# Patient Record
Sex: Male | Born: 1955
Health system: Southern US, Community
[De-identification: ages and names within clinical notes are randomized; demographics above are authoritative.]

## PROBLEM LIST (undated history)

## (undated) DIAGNOSIS — N400 Enlarged prostate without lower urinary tract symptoms: Secondary | ICD-10-CM

## (undated) DIAGNOSIS — E669 Obesity, unspecified: Secondary | ICD-10-CM

## (undated) DIAGNOSIS — J302 Other seasonal allergic rhinitis: Secondary | ICD-10-CM

## (undated) DIAGNOSIS — G4730 Sleep apnea, unspecified: Secondary | ICD-10-CM

## (undated) DIAGNOSIS — F419 Anxiety disorder, unspecified: Secondary | ICD-10-CM

## (undated) DIAGNOSIS — F1910 Other psychoactive substance abuse, uncomplicated: Secondary | ICD-10-CM

## (undated) DIAGNOSIS — J189 Pneumonia, unspecified organism: Secondary | ICD-10-CM

## (undated) DIAGNOSIS — I10 Essential (primary) hypertension: Secondary | ICD-10-CM

## (undated) DIAGNOSIS — E538 Deficiency of other specified B group vitamins: Secondary | ICD-10-CM

## (undated) DIAGNOSIS — M1990 Unspecified osteoarthritis, unspecified site: Secondary | ICD-10-CM

## (undated) DIAGNOSIS — G250 Essential tremor: Secondary | ICD-10-CM

## (undated) DIAGNOSIS — J449 Chronic obstructive pulmonary disease, unspecified: Secondary | ICD-10-CM

## (undated) DIAGNOSIS — R7401 Elevation of levels of liver transaminase levels: Secondary | ICD-10-CM

## (undated) DIAGNOSIS — M419 Scoliosis, unspecified: Secondary | ICD-10-CM

## (undated) DIAGNOSIS — E66811 Obesity, class 1: Secondary | ICD-10-CM

## (undated) DIAGNOSIS — I454 Nonspecific intraventricular block: Secondary | ICD-10-CM

## (undated) DIAGNOSIS — I712 Thoracic aortic aneurysm, without rupture, unspecified: Secondary | ICD-10-CM

## (undated) DIAGNOSIS — Z8659 Personal history of other mental and behavioral disorders: Secondary | ICD-10-CM

## (undated) DIAGNOSIS — N189 Chronic kidney disease, unspecified: Secondary | ICD-10-CM

## (undated) DIAGNOSIS — K5790 Diverticulosis of intestine, part unspecified, without perforation or abscess without bleeding: Secondary | ICD-10-CM

## (undated) DIAGNOSIS — R972 Elevated prostate specific antigen [PSA]: Secondary | ICD-10-CM

## (undated) DIAGNOSIS — D649 Anemia, unspecified: Secondary | ICD-10-CM

## (undated) DIAGNOSIS — J45909 Unspecified asthma, uncomplicated: Secondary | ICD-10-CM

## (undated) DIAGNOSIS — M810 Age-related osteoporosis without current pathological fracture: Secondary | ICD-10-CM

## (undated) DIAGNOSIS — L309 Dermatitis, unspecified: Secondary | ICD-10-CM

## (undated) DIAGNOSIS — F1010 Alcohol abuse, uncomplicated: Secondary | ICD-10-CM

## (undated) DIAGNOSIS — K219 Gastro-esophageal reflux disease without esophagitis: Secondary | ICD-10-CM

## (undated) DIAGNOSIS — I509 Heart failure, unspecified: Secondary | ICD-10-CM

## (undated) DIAGNOSIS — R740 Nonspecific elevation of levels of transaminase and lactic acid dehydrogenase [LDH]: Secondary | ICD-10-CM

## (undated) DIAGNOSIS — J4520 Mild intermittent asthma, uncomplicated: Secondary | ICD-10-CM

## (undated) DIAGNOSIS — I701 Atherosclerosis of renal artery: Secondary | ICD-10-CM

## (undated) HISTORY — DX: Alcohol abuse, uncomplicated: F10.10

## (undated) HISTORY — DX: Unspecified osteoarthritis, unspecified site: M19.90

## (undated) HISTORY — DX: Gastro-esophageal reflux disease without esophagitis: K21.9

## (undated) HISTORY — DX: Obesity, unspecified: E66.9

## (undated) HISTORY — DX: Scoliosis, unspecified: M41.9

## (undated) HISTORY — DX: Unspecified asthma, uncomplicated: J45.909

## (undated) HISTORY — DX: Nonspecific intraventricular block: I45.4

## (undated) HISTORY — DX: Other seasonal allergic rhinitis: J30.2

## (undated) HISTORY — DX: Personal history of other mental and behavioral disorders: Z86.59

## (undated) HISTORY — DX: Essential tremor: G25.0

## (undated) HISTORY — DX: Elevated prostate specific antigen (PSA): R97.20

## (undated) HISTORY — DX: Atherosclerosis of renal artery: I70.1

## (undated) HISTORY — DX: Elevation of levels of liver transaminase levels: R74.01

## (undated) HISTORY — PX: VASECTOMY: SHX75

## (undated) HISTORY — PX: HERNIA REPAIR: SHX51

## (undated) HISTORY — DX: Essential (primary) hypertension: I10

## (undated) HISTORY — DX: Dermatitis, unspecified: L30.9

## (undated) HISTORY — DX: Age-related osteoporosis without current pathological fracture: M81.0

## (undated) HISTORY — DX: Diverticulosis of intestine, part unspecified, without perforation or abscess without bleeding: K57.90

## (undated) HISTORY — DX: Other psychoactive substance abuse, uncomplicated: F19.10

## (undated) HISTORY — DX: Mild intermittent asthma, uncomplicated: J45.20

## (undated) HISTORY — PX: INGUINAL HERNIA REPAIR: SUR1180

## (undated) HISTORY — DX: Benign prostatic hyperplasia without lower urinary tract symptoms: N40.0

## (undated) HISTORY — DX: Obesity, class 1: E66.811

## (undated) HISTORY — DX: Nonspecific elevation of levels of transaminase and lactic acid dehydrogenase (ldh): R74.0

---

## 1997-06-08 ENCOUNTER — Emergency Department (HOSPITAL_COMMUNITY): Admission: EM | Admit: 1997-06-08 | Discharge: 1997-06-08 | Payer: Self-pay | Admitting: Emergency Medicine

## 2003-04-15 ENCOUNTER — Ambulatory Visit (HOSPITAL_COMMUNITY): Admission: RE | Admit: 2003-04-15 | Discharge: 2003-04-15 | Payer: Self-pay | Admitting: Gastroenterology

## 2005-10-10 ENCOUNTER — Encounter: Admission: RE | Admit: 2005-10-10 | Discharge: 2005-10-10 | Payer: Self-pay | Admitting: Internal Medicine

## 2006-01-01 HISTORY — PX: ELBOW SURGERY: SHX618

## 2006-08-15 ENCOUNTER — Encounter (INDEPENDENT_AMBULATORY_CARE_PROVIDER_SITE_OTHER): Payer: Self-pay | Admitting: Plastic Surgery

## 2006-08-15 ENCOUNTER — Ambulatory Visit (HOSPITAL_BASED_OUTPATIENT_CLINIC_OR_DEPARTMENT_OTHER): Admission: RE | Admit: 2006-08-15 | Discharge: 2006-08-15 | Payer: Self-pay | Admitting: Plastic Surgery

## 2006-11-22 ENCOUNTER — Ambulatory Visit (HOSPITAL_COMMUNITY): Admission: RE | Admit: 2006-11-22 | Discharge: 2006-11-22 | Payer: Self-pay | Admitting: Neurology

## 2007-08-28 ENCOUNTER — Ambulatory Visit (HOSPITAL_BASED_OUTPATIENT_CLINIC_OR_DEPARTMENT_OTHER): Admission: RE | Admit: 2007-08-28 | Discharge: 2007-08-28 | Payer: Self-pay | Admitting: Orthopaedic Surgery

## 2008-01-01 ENCOUNTER — Ambulatory Visit (HOSPITAL_COMMUNITY): Admission: RE | Admit: 2008-01-01 | Discharge: 2008-01-01 | Payer: Self-pay | Admitting: Internal Medicine

## 2008-11-12 LAB — HM COLONOSCOPY

## 2009-01-01 DIAGNOSIS — I701 Atherosclerosis of renal artery: Secondary | ICD-10-CM

## 2009-01-01 HISTORY — DX: Atherosclerosis of renal artery: I70.1

## 2009-02-01 HISTORY — PX: US ECHOCARDIOGRAPHY: HXRAD669

## 2009-05-01 HISTORY — PX: US RENAL/AORTA: HXRAD530

## 2010-05-16 NOTE — Op Note (Signed)
[name], Louis [name]                  ACCOUNT NO.:  [id number]   MEDICAL RECORD NO.:  [id number]          PATIENT TYPE:  AMB   LOCATION:  DSC                          FACILITY:  [facility]   PHYSICIAN:  Brantley Persons, M.D.DATE OF BIRTH:  [birth year]   DATE OF PROCEDURE:  08/15/2006  DATE OF DISCHARGE:                               OPERATIVE REPORT   PREOP DIAGNOSIS:  Suspicious skin lesion left nasolabial fold.   POSTOP DIAGNOSIS:  Suspicious skin lesion left nasolabial fold.   PROCEDURE:  1. Excisional biopsy 0.6 cm suspicious skin lesion with intraoperative      frozen section diagnosis left nasolabial fold.  2. Complex closure 1.1 cm incision left nasolabial fold incision.   ATTENDING SURGEON:  Dr. Corrie Dandy Contogiannis.   ANESTHESIA:  1% lidocaine with epinephrine.   COMPLICATIONS:  None.   INDICATIONS FOR PROCEDURE:  The patient is a 55 year old Caucasian male  who presents with a suspicious skin lesion in an area adjacent to the  left nasolabial fold.  The patient would like to see with an excisional  biopsy.  We will do so with an intraoperative frozen section diagnosis  to confirm margins as well as a preliminary diagnosis.   PROCEDURE:  The patient was brought to the minor room, placed on the  table in the supine position.  The face was prepped with Betadine and  draped in sterile fashion.  The skin and subcutaneous tissues in the  area of the suspicious skin lesion were injected with 4% lidocaine with  epinephrine.  After adequate hemostasis and anesthesia had taken effect  the procedure was begun.   Using a loupe magnification, the suspicious skin lesion was identified.  The skin lesion was then excised with 1 mm circumferential margins full  thickness through the skin into the subcutaneous tissues.  The specimen  was marked in a 12 o'clock position and passed off the table to undergo  an intraoperative frozen section diagnosis.  Dr. Jimmy Picket, the  pathologist,  reviewed the specimen and felt that all the margins were  clear.  He did not think that a skin cancer was present, however, he was  not sure what type of benign lesion this was.  They will perform  permanent pathologic section evaluation to fully determine the nature of  the skin lesion.  Total diameter of the excised skin lesion was 0.6 cm.   A complex closure of the incision then proceeded.  The deeper  subcutaneous tissues were closed with 4-0 Monocryl suture.  The dermal  layer was also closed with 4-0 Monocryl suture.  The skin was then  closed with a 6-0 Prolene in a running baseball type stitch.  Total  length of incision closure was 1.1 cm.  This skin lesion was adjacent to  the  medial aspect of the left nasal labial fold and closed nicely.  Bacitracin ointment was applied for dressing.  There were no  complications.  The patient tolerated the procedure well.  He was given  proper postoperative wound care instructions and discharged home in  stable condition.  Follow-up appointment  will be tomorrow in the office.           ______________________________  Brantley Persons, M.D.     MC/MEDQ  D:  08/15/2006  T:  08/16/2006  Job:  [id number]   cc:   Brantley Persons, M.D.

## 2010-05-16 NOTE — Op Note (Signed)
Louis [name], Louis [name]                  ACCOUNT NO.:  [id number]   MEDICAL RECORD NO.:  [id number]          PATIENT TYPE:  AMB   LOCATION:  DSC                          FACILITY:  [facility]   PHYSICIAN:  Claude Manges. Whitfield, M.D.DATE OF BIRTH:  August 15, 1955   DATE OF PROCEDURE:  08/28/2007  DATE OF DISCHARGE:                               OPERATIVE REPORT   PREOPERATIVE DIAGNOSIS:  Refractory medial epicondylitis, right elbow.   POSTOPERATIVE DIAGNOSIS:  Refractory medial epicondylitis, right elbow.   PROCEDURE:  Release of common flexor tendon, medial epicondyle, right  elbow with debridement of abnormal tendon.   SURGEON:  Claude Manges. Cleophas Dunker, MD   ANESTHESIA:  General LMA.   COMPLICATIONS:  None.   HISTORY:  A 55 year old gentleman has had recurrent problems with both  medial and lateral epicondylitis of his right elbow.  He has had 2 prior  cortisone injections medially and 1 laterally.  He is not presently  experiencing symptoms laterally, but continues to have pain with grip,  particularly in flexion medially.  He is now to have surgical  decompression.   PROCEDURE:  With the patient comfortable on operating table, he was  placed under general LMA anesthesia.  The right upper extremity was then  placed in an arm tourniquet.  The arm was prepped with DuraPrep from the  wrist to the tourniquet.  Sterile draping was performed with the  extremity still elevated, was Esmarch exsanguinated with a proximal  tourniquet at 250 mmHg.   A slightly curvilinear incision was outlined over the medial epicondyle  and via sharp dissection carried down to subcutaneous tissue, then via  blunt dissection, adipose tissue and superficial fascia was elevated  from the common flexor tendon attachment to the medial epicondyle.  Self-  retaining retractor was inserted.  I could palpate the ulnar nerve, but  it was not visualized.  There were no ulnar nerve symptoms.   I then incised the origin of the  common flexor tendon approximately a  centimeter distal to the end of the medial epicondyle.  The flexor  tendon was then elevated and I could see degenerative tendon that  appeared to be the cause of the problem.  There were also areas of  cortisone particles and these were sharply debrided, so that I did not  see any abnormal tendon.  The exposed bone was debrided, so that I had a  bleeding surface.  The wound was irrigated with saline solution.  I  reattached the tendon anatomically with 2-0 Vicryl.  The wound was again  irrigated.  The subcu was closed with 2-0 and 3-0 Vicryl, and the skin  closed with Steri-Strips over benzoin.  Marcaine without epinephrine was  injected in the wound edges.  Sterile bulky dressing was applied  followed by posterior splint and Ace bandage.  Tourniquet was deflated  with immediate capillary refill to the fingers.   The patient tolerated the procedure without complications.   PLAN:  Office 1 week.  Percocet for pain.      Claude Manges. Cleophas Dunker, M.D.  Electronically Signed  PWW/MEDQ  D:  08/28/2007  T:  08/28/2007  Job:  [id number]

## 2010-05-19 NOTE — Op Note (Signed)
NAME:  Louis [name], Louis [name]                            ACCOUNT NO.:  [id number]   MEDICAL RECORD NO.:  [id number]                   PATIENT TYPE:  AMB   LOCATION:  ENDO                                 FACILITY:  [facility]   PHYSICIAN:  Anselmo Rod, M.D.               DATE OF BIRTH:  [birth year]   DATE OF PROCEDURE:  04/15/2003  DATE OF DISCHARGE:                                 OPERATIVE REPORT   PROCEDURE PERFORMED:  Screening colonoscopy.   ENDOSCOPIST:  Anselmo Rod, M.D.   INSTRUMENT USED:  Olympus video colonoscope.   INDICATION FOR PROCEDURE:  A 55 year old white male with a family history of  colon cancer in a first degree relative (father) undergoing a screening  colonoscopy.  Rule out colonic polyps, masses, etc.   PREPROCEDURE PREPARATION:  Informed consent was procured from the patient.  The patient was fasted for eight hours prior to the procedure and prepped  with a bottle of Miralax the night prior to the procedure.   PREPROCEDURE PHYSICAL:  VITAL SIGNS:  The patient had stable vital signs.  NECK:  Supple.  CHEST:  Clear to auscultation.  S1, S2 regular.  ABDOMEN:  Soft with normal bowel sounds.   DESCRIPTION OF PROCEDURE:  The patient was placed in the left lateral  decubitus position and sedated with 90 mg of Demerol and 9 mg of Versed  intravenously.  Once the patient was adequately sedate and maintained on low-  flow oxygen and continuous cardiac monitoring, the Olympus video colonoscope  was advanced from the rectum to the cecum.  The patient had extensive  sigmoid diverticular disease.  No masses or polyps were identified.  The  appendiceal orifice and the ileocecal valve were clearly visualized and  photographed.  Retroflexion in the rectum revealed no abnormalities as well.  The patient tolerated the procedure well without immediate complications.   IMPRESSION:  Sigmoid diverticulosis, otherwise normal colonoscopy up to the  cecum.   RECOMMENDATIONS:  1.  Continue a high-fiber diet with liberal fluid intake.  Brochures on     diverticulosis have been given to the patient for his education.  2. Repeat CRC screening in the next five years unless the patient develops     any abnormal symptoms in the interim.                                               Anselmo Rod, M.D.    JNM/MEDQ  D:  04/15/2003  T:  04/16/2003  Job:  [id number]   cc:   Marjory Lies, M.D.  [address]  [place]  [place] [address]  Fax: [phone]

## 2011-01-02 HISTORY — PX: NASAL SEPTUM SURGERY: SHX37

## 2011-11-23 ENCOUNTER — Other Ambulatory Visit: Payer: Self-pay | Admitting: Otolaryngology

## 2013-01-01 DIAGNOSIS — R972 Elevated prostate specific antigen [PSA]: Secondary | ICD-10-CM

## 2013-01-01 HISTORY — DX: Elevated prostate specific antigen (PSA): R97.20

## 2013-05-21 ENCOUNTER — Other Ambulatory Visit: Payer: Self-pay | Admitting: Dermatology

## 2013-06-12 LAB — CBC
HGB: 13.3 g/dL
WBC: 3.4
platelet count: 281

## 2013-06-16 LAB — TSH: TSH: 0.8

## 2013-06-16 LAB — LIPID PANEL
Cholesterol: 204
HDL Cholesterol: 75
LDL (calc): 119
Triglycerides: 51

## 2013-06-16 LAB — PSA: PSA: 4.307

## 2013-06-16 LAB — VITAMIN D 25 HYDROXY (VIT D DEFICIENCY, FRACTURES): Vit D, 25-Hydroxy: 67.1

## 2013-10-30 LAB — COMPREHENSIVE METABOLIC PANEL
ALT: 18
AST: 18 U/L
Alkaline Phosphatase: 59 U/L
Creat: 0.9
Glucose: 103
Total Bilirubin: 0.9 mg/dL
Total Protein: 7.5 g/dL

## 2013-10-30 LAB — PSA
PSA: 3.19
PSA: 3.19

## 2013-12-01 HISTORY — PX: COLONOSCOPY: SHX174

## 2013-12-10 ENCOUNTER — Ambulatory Visit: Payer: Self-pay | Admitting: Family Medicine

## 2014-10-08 ENCOUNTER — Ambulatory Visit (INDEPENDENT_AMBULATORY_CARE_PROVIDER_SITE_OTHER): Payer: BLUE CROSS/BLUE SHIELD | Admitting: Family Medicine

## 2014-10-08 ENCOUNTER — Encounter: Payer: Self-pay | Admitting: Family Medicine

## 2014-10-08 VITALS — BP 140/84 | HR 72 | Temp 98.2°F | Ht 70.25 in | Wt 219.5 lb

## 2014-10-08 DIAGNOSIS — E66811 Obesity, class 1: Secondary | ICD-10-CM | POA: Insufficient documentation

## 2014-10-08 DIAGNOSIS — J4520 Mild intermittent asthma, uncomplicated: Secondary | ICD-10-CM | POA: Insufficient documentation

## 2014-10-08 DIAGNOSIS — Z23 Encounter for immunization: Secondary | ICD-10-CM

## 2014-10-08 DIAGNOSIS — D72819 Decreased white blood cell count, unspecified: Secondary | ICD-10-CM | POA: Diagnosis not present

## 2014-10-08 DIAGNOSIS — K219 Gastro-esophageal reflux disease without esophagitis: Secondary | ICD-10-CM | POA: Diagnosis not present

## 2014-10-08 DIAGNOSIS — J302 Other seasonal allergic rhinitis: Secondary | ICD-10-CM | POA: Insufficient documentation

## 2014-10-08 DIAGNOSIS — F411 Generalized anxiety disorder: Secondary | ICD-10-CM | POA: Insufficient documentation

## 2014-10-08 DIAGNOSIS — Z0000 Encounter for general adult medical examination without abnormal findings: Secondary | ICD-10-CM | POA: Diagnosis not present

## 2014-10-08 DIAGNOSIS — M419 Scoliosis, unspecified: Secondary | ICD-10-CM

## 2014-10-08 DIAGNOSIS — M810 Age-related osteoporosis without current pathological fracture: Secondary | ICD-10-CM | POA: Insufficient documentation

## 2014-10-08 DIAGNOSIS — E669 Obesity, unspecified: Secondary | ICD-10-CM | POA: Insufficient documentation

## 2014-10-08 DIAGNOSIS — I10 Essential (primary) hypertension: Secondary | ICD-10-CM

## 2014-10-08 DIAGNOSIS — R972 Elevated prostate specific antigen [PSA]: Secondary | ICD-10-CM

## 2014-10-08 MED ORDER — METOPROLOL SUCCINATE ER 100 MG PO TB24: ORAL_TABLET | ORAL | Status: DC

## 2014-10-08 NOTE — Assessment & Plan Note (Signed)
Controlled on PRN albuterol (rarely used). Weaned self off advair.

## 2014-10-08 NOTE — Assessment & Plan Note (Signed)
Infrequently uses xanax mainly for sleep

## 2014-10-08 NOTE — Assessment & Plan Note (Signed)
Preventative protocols reviewed and updated unless pt declined. Discussed healthy diet and lifestyle.  

## 2014-10-08 NOTE — Assessment & Plan Note (Signed)
Controlled on flonase

## 2014-10-08 NOTE — Patient Instructions (Addendum)
Pass by [address] at your convenience for fasting labs. Flu shot today. Finish 50,000 units weekly vitamin D then start 1000 units daily.  You are doing well today, return as needed or in 1 year for next physical.  Health Maintenance, Male A healthy lifestyle and preventative care can promote health and wellness.  Maintain regular health, dental, and eye exams.  Eat a healthy diet. Foods like vegetables, fruits, whole grains, low-fat dairy products, and lean protein foods contain the nutrients you need and are low in calories. Decrease your intake of foods high in solid fats, added sugars, and salt. Get information about a proper diet from your health care provider, if necessary.  Regular physical exercise is one of the most important things you can do for your health. Most adults should get at least 150 minutes of moderate-intensity exercise (any activity that increases your heart rate and causes you to sweat) each week. In addition, most adults need muscle-strengthening exercises on 2 or more days a week.   Maintain a healthy weight. The body mass index (BMI) is a screening tool to identify possible weight problems. It provides an estimate of body fat based on height and weight. Your health care provider can find your BMI and can help you achieve or maintain a healthy weight. For males 20 years and older:  A BMI below 18.5 is considered underweight.  A BMI of 18.5 to 24.9 is normal.  A BMI of 25 to 29.9 is considered overweight.  A BMI of 30 and above is considered obese.  Maintain normal blood lipids and cholesterol by exercising and minimizing your intake of saturated fat. Eat a balanced diet with plenty of fruits and vegetables. Blood tests for lipids and cholesterol should begin at age 90 and be repeated every 5 years. If your lipid or cholesterol levels are high, you are over age 11, or you are at high risk for heart disease, you may need your cholesterol levels checked more  frequently.Ongoing high lipid and cholesterol levels should be treated with medicines if diet and exercise are not working.  If you smoke, find out from your health care provider how to quit. If you do not use tobacco, do not start.  Lung cancer screening is recommended for adults aged 69-80 years who are at high risk for developing lung cancer because of a history of smoking. A yearly low-dose CT scan of the lungs is recommended for people who have at least a 30-pack-year history of smoking and are current smokers or have quit within the past 15 years. A pack year of smoking is smoking an average of 1 pack of cigarettes a day for 1 year (for example, a 30-pack-year history of smoking could mean smoking 1 pack a day for 30 years or 2 packs a day for 15 years). Yearly screening should continue until the smoker has stopped smoking for at least 15 years. Yearly screening should be stopped for people who develop a health problem that would prevent them from having lung cancer treatment.  If you choose to drink alcohol, do not have more than 2 drinks per day. One drink is considered to be 12 oz (360 mL) of beer, 5 oz (150 mL) of wine, or 1.5 oz (45 mL) of liquor.  Avoid the use of street drugs. Do not share needles with anyone. Ask for help if you need support or instructions about stopping the use of drugs.  High blood pressure causes heart disease and increases the risk  of stroke. High blood pressure is more likely to develop in:  People who have blood pressure in the end of the normal range (100-139/85-89 mm Hg).  People who are overweight or obese.  People who are African American.  If you are 30-53 years of age, have your blood pressure checked every 3-5 years. If you are 14 years of age or older, have your blood pressure checked every year. You should have your blood pressure measured twice--once when you are at a hospital or clinic, and once when you are not at a hospital or clinic. Record the  average of the two measurements. To check your blood pressure when you are not at a hospital or clinic, you can use:  An automated blood pressure machine at a pharmacy.  A home blood pressure monitor.  If you are 16-3 years old, ask your health care provider if you should take aspirin to prevent heart disease.  Diabetes screening involves taking a blood sample to check your fasting blood sugar level. This should be done once every 3 years after age 80 if you are at a normal weight and without risk factors for diabetes. Testing should be considered at a younger age or be carried out more frequently if you are overweight and have at least 1 risk factor for diabetes.  Colorectal cancer can be detected and often prevented. Most routine colorectal cancer screening begins at the age of 77 and continues through age 69. However, your health care provider may recommend screening at an earlier age if you have risk factors for colon cancer. On a yearly basis, your health care provider may provide home test kits to check for hidden blood in the stool. A small camera at the end of a tube may be used to directly examine the colon (sigmoidoscopy or colonoscopy) to detect the earliest forms of colorectal cancer. Talk to your health care provider about this at age 49 when routine screening begins. A direct exam of the colon should be repeated every 5-10 years through age 16, unless early forms of precancerous polyps or small growths are found.  People who are at an increased risk for hepatitis B should be screened for this virus. You are considered at high risk for hepatitis B if:  You were born in a country where hepatitis B occurs often. Talk with your health care provider about which countries are considered high risk.  Your parents were born in a high-risk country and you have not received a shot to protect against hepatitis B (hepatitis B vaccine).  You have HIV or AIDS.  You use needles to inject street  drugs.  You live with, or have sex with, someone who has hepatitis B.  You are a man who has sex with other men (MSM).  You get hemodialysis treatment.  You take certain medicines for conditions like cancer, organ transplantation, and autoimmune conditions.  Hepatitis C blood testing is recommended for all people born from [birth year] through 1963 and any individual with known risk factors for hepatitis C.  Healthy men should no longer receive prostate-specific antigen (PSA) blood tests as part of routine cancer screening. Talk to your health care provider about prostate cancer screening.  Testicular cancer screening is not recommended for adolescents or adult males who have no symptoms. Screening includes self-exam, a health care provider exam, and other screening tests. Consult with your health care provider about any symptoms you have or any concerns you have about testicular cancer.  Practice safe sex.  Use condoms and avoid high-risk sexual practices to reduce the spread of sexually transmitted infections (STIs).  You should be screened for STIs, including gonorrhea and chlamydia if:  You are sexually active and are younger than 24 years.  You are older than 24 years, and your health care provider tells you that you are at risk for this type of infection.  Your sexual activity has changed since you were last screened, and you are at an increased risk for chlamydia or gonorrhea. Ask your health care provider if you are at risk.  If you are at risk of being infected with HIV, it is recommended that you take a prescription medicine daily to prevent HIV infection. This is called pre-exposure prophylaxis (PrEP). You are considered at risk if:  You are a man who has sex with other men (MSM).  You are a heterosexual man who is sexually active with multiple partners.  You take drugs by injection.  You are sexually active with a partner who has HIV.  Talk with your health care provider about  whether you are at high risk of being infected with HIV. If you choose to begin PrEP, you should first be tested for HIV. You should then be tested every 3 months for as long as you are taking PrEP.  Use sunscreen. Apply sunscreen liberally and repeatedly throughout the day. You should seek shade when your shadow is shorter than you. Protect yourself by wearing long sleeves, pants, a wide-brimmed hat, and sunglasses year round whenever you are outdoors.  Tell your health care provider of new moles or changes in moles, especially if there is a change in shape or color. Also, tell your health care provider if a mole is larger than the size of a pencil eraser.  A one-time screening for abdominal aortic aneurysm (AAA) and surgical repair of large AAAs by ultrasound is recommended for men aged 72-75 years who are current or former smokers.  Stay current with your vaccines (immunizations).   This information is not intended to replace advice given to you by your health care provider. Make sure you discuss any questions you have with your health care provider.   Document Released: 06/16/2007 Document Revised: 01/08/2014 Document Reviewed: 05/15/2010 Elsevier Interactive Patient Education [employer].

## 2014-10-08 NOTE — Assessment & Plan Note (Signed)
Chronic, stable. Continue current regimen. Pt notes better control on higher toprol XL dose - continue.

## 2014-10-08 NOTE — Progress Notes (Signed)
Pre visit review using our clinic review tool, if applicable. No additional management support is needed unless otherwise documented below in the visit note. 

## 2014-10-08 NOTE — Assessment & Plan Note (Signed)
Encouraged healthy diet and lifestyle.  

## 2014-10-08 NOTE — Progress Notes (Addendum)
BP 140/84 mmHg  Pulse 72  Temp(Src) 98.2 F (36.8 C) (Oral)  Ht 5' 10.25" (1.784 m)  Wt 219 lb 8 oz (99.565 kg)  BMI 31.28 kg/m2   CC: new pt, CPE  Subjective:    [name], male    DOB: March 22, 1955, 59 y.o.   MRN: [id number]  HPI: Louis [name] is a 59 y.o. male presenting on 10/08/2014 for Establish Care   HTN - on diovan and toprol XL and amlodipine.   Asthma - was on advair. Weaned off due to cost. No recent albuterol use.  Anxiety - on xanax prn. Was having some car anxiety during family issues. Now only uses prn for sleep.   Vit D def - for winter months takes 50,000 IU weekly.   Preventative: COLONOSCOPY Date: Apr 18, 2013 polyps, int hem, diverticulosis, rpt 5 yrs [name]) Prostate screening - h/o elevated, sees Dr Diona Fanti. S/p benign biopsy Apr 18, 2012, attributed to BPH. Sees yearly. Dr Diona Fanti checks prostate levels. Flu shot yearly Pneumovax 04-18-08 Tdap Apr 19, 2006 zostavax 04-19-11 Seat belt use discussed Sunscreen use discussed. no changing moles.  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 Interim medical history since our last visit reviewed. Allergies and medications reviewed and updated. No current outpatient prescriptions on file prior to visit.   No current facility-administered medications on file prior to visit.    Review of Systems  Constitutional: Negative for fever, chills, activity change, appetite change, fatigue and unexpected weight change.  HENT: Negative for hearing loss.   Eyes: Negative for visual disturbance.  Respiratory: Positive for wheezing. Negative for cough, chest tightness and shortness of breath.   Cardiovascular: Negative for chest pain, palpitations and leg swelling.    Gastrointestinal: Negative for nausea, vomiting, abdominal pain, diarrhea, constipation, blood in stool and abdominal distention.  Genitourinary: Negative for hematuria and difficulty urinating.  Musculoskeletal: Negative for myalgias, arthralgias and neck pain.  Skin: Negative for rash.  Neurological: Negative for dizziness, seizures, syncope and headaches.  Hematological: Negative for adenopathy. Does not bruise/bleed easily.  Psychiatric/Behavioral: Negative for dysphoric mood. The patient is not nervous/anxious.    Per HPI unless specifically indicated above     Objective:    BP 140/84 mmHg  Pulse 72  Temp(Src) 98.2 F (36.8 C) (Oral)  Ht 5' 10.25" (1.784 m)  Wt 219 lb 8 oz (99.565 kg)  BMI 31.28 kg/m2  Wt Readings from Last 3 Encounters:  10/08/14 219 lb 8 oz (99.565 kg)    Physical Exam  Constitutional: He is oriented to person, place, and time. He appears well-developed and well-nourished. No distress.  HENT:  Head: Normocephalic and atraumatic.  Right Ear: Hearing, tympanic membrane, external ear and ear canal normal.  Left Ear: Hearing, tympanic membrane, external ear and ear canal normal.  Nose: Nose normal.  Mouth/Throat: Uvula is midline, oropharynx is clear and moist and mucous membranes are normal. No oropharyngeal exudate, posterior oropharyngeal edema or posterior oropharyngeal erythema.  Eyes: Conjunctivae and EOM are normal. Pupils are equal, round, and reactive to light. No scleral icterus.  Neck: Normal range of motion. Neck supple. No thyromegaly present.  Cardiovascular: Normal rate, regular rhythm, normal heart sounds and intact distal pulses.   No murmur heard. Pulses:      Radial pulses  are 2+ on the right side, and 2+ on the left side.  Pulmonary/Chest: Effort normal and breath sounds normal. No respiratory distress. He has no wheezes. He has no rales.  Abdominal: Soft. Bowel sounds are normal. He exhibits no distension and no mass. There is no  tenderness. There is no rebound and no guarding.  Musculoskeletal: Normal range of motion. He exhibits no edema.  Lymphadenopathy:    He has no cervical adenopathy.  Neurological: He is alert and oriented to person, place, and time.  CN grossly intact, station and gait intact  Skin: Skin is warm and dry. No rash noted.  Psychiatric: He has a normal mood and affect. His behavior is normal. Judgment and thought content normal.  Nursing note and vitals reviewed.  No results found for this or any previous visit.    Assessment & Plan:   Problem List Items Addressed This Visit    Seasonal allergies    Controlled on flonase      Scoliosis   Osteoporosis    Check records when they arrive from [facility].       Relevant Medications   Vitamin D, Ergocalciferol, (DRISDOL) 50000 UNITS CAPS capsule   Other Relevant Orders   Vit D  25 hydroxy (rtn osteoporosis monitoring)   Obesity, Class I, BMI 30-34.9    Encouraged healthy diet and lifestyle.       Mild intermittent asthma in adult without complication    Controlled on PRN albuterol (rarely used). Weaned self off advair.      Relevant Medications   albuterol (PROVENTIL HFA;VENTOLIN HFA) 108 (90 BASE) MCG/ACT inhaler   HTN (hypertension)    Chronic, stable. Continue current regimen. Pt notes better control on higher toprol XL dose - continue.      Relevant Medications   amLODipine (NORVASC) 5 MG tablet   valsartan (DIOVAN) 320 MG tablet   metoprolol succinate (TOPROL-XL) 100 MG 24 hr tablet   Other Relevant Orders   Lipid panel   Comprehensive metabolic panel   Health maintenance examination - Primary    Preventative protocols reviewed and updated unless pt declined. Discussed healthy diet and lifestyle.       GERD (gastroesophageal reflux disease)   Elevated PSA   Anxiety state    Infrequently uses xanax mainly for sleep      Relevant Medications   ALPRAZolam (XANAX) 0.5 MG tablet    Other Visit Diagnoses    Need for  influenza vaccination        Relevant Orders    Flu Vaccine QUAD 36+ mos PF IM (Fluarix & Fluzone Quad PF) (Completed)    Leukopenia        Relevant Orders    CBC with Differential/Platelet        Follow up plan: Return in about 1 year (around 10/08/2015), or as needed, for annual exam, prior fasting for blood work.

## 2014-10-08 NOTE — Assessment & Plan Note (Signed)
Check records when they arrive from [facility].

## 2014-10-10 ENCOUNTER — Encounter: Payer: Self-pay | Admitting: Family Medicine

## 2014-10-10 DIAGNOSIS — K219 Gastro-esophageal reflux disease without esophagitis: Secondary | ICD-10-CM | POA: Insufficient documentation

## 2014-10-10 DIAGNOSIS — R972 Elevated prostate specific antigen [PSA]: Secondary | ICD-10-CM | POA: Insufficient documentation

## 2014-10-10 DIAGNOSIS — G250 Essential tremor: Secondary | ICD-10-CM | POA: Insufficient documentation

## 2014-10-10 NOTE — Addendum Note (Signed)
Addended by: Ria Bush on: 10/10/2014 12:04 PM   Modules accepted: Orders

## 2014-10-11 LAB — COMPREHENSIVE METABOLIC PANEL
ALT: 73
AST: 73 U/L
Alkaline Phosphatase: 70 U/L
Creat: 1
Glucose: 90
Potassium: 5.3 mmol/L
Total Bilirubin: 0.7 mg/dL

## 2014-10-11 LAB — LIPID PANEL
Cholesterol: 204
HDL Cholesterol: 75
LDL (calc): 119
Triglycerides: 51

## 2014-10-11 LAB — CBC
HGB: 13.3 g/dL
WBC: 3.4
platelet count: 281

## 2014-10-11 LAB — PSA: PSA: 4.307

## 2014-10-11 LAB — TSH: TSH: 0.8

## 2014-10-11 LAB — MICROALBUMIN / CREATININE URINE RATIO: Albumin/Creatinine Ratio, Urine, POC: 5.2

## 2014-10-11 LAB — VITAMIN D 25 HYDROXY (VIT D DEFICIENCY, FRACTURES): Vit D, 25-Hydroxy: 67.1

## 2014-10-13 ENCOUNTER — Encounter: Payer: Self-pay | Admitting: *Deleted

## 2014-10-24 ENCOUNTER — Encounter: Payer: Self-pay | Admitting: Family Medicine

## 2014-10-26 ENCOUNTER — Other Ambulatory Visit: Payer: Self-pay

## 2014-11-04 ENCOUNTER — Encounter: Payer: Self-pay | Admitting: Family Medicine

## 2014-11-04 DIAGNOSIS — I701 Atherosclerosis of renal artery: Secondary | ICD-10-CM | POA: Insufficient documentation

## 2014-11-29 ENCOUNTER — Other Ambulatory Visit: Payer: Self-pay

## 2014-11-29 ENCOUNTER — Encounter: Payer: Self-pay | Admitting: *Deleted

## 2014-11-29 MED ORDER — VALSARTAN 320 MG PO TABS: 320.0000 mg | ORAL_TABLET | Freq: Every day | ORAL | Status: DC

## 2014-11-29 MED ORDER — AMLODIPINE BESYLATE 5 MG PO TABS: 5.0000 mg | ORAL_TABLET | Freq: Every day | ORAL | Status: DC

## 2014-11-29 NOTE — Telephone Encounter (Signed)
Pt left v/m requesting refill amlodipine and valsartan to [facility] [address]. Pt seen 10/08/14 and refill done per protocol. Notified pt via v/m as DPR allowed.

## 2014-12-02 ENCOUNTER — Other Ambulatory Visit: Payer: Self-pay | Admitting: *Deleted

## 2014-12-02 MED ORDER — AMLODIPINE BESYLATE 5 MG PO TABS: 5.0000 mg | ORAL_TABLET | Freq: Every day | ORAL | Status: DC

## 2014-12-02 MED ORDER — VALSARTAN 320 MG PO TABS: 320.0000 mg | ORAL_TABLET | Freq: Every day | ORAL | Status: DC

## 2014-12-31 ENCOUNTER — Other Ambulatory Visit: Payer: Self-pay

## 2014-12-31 MED ORDER — ALPRAZOLAM 0.5 MG PO TABS: 0.5000 mg | ORAL_TABLET | Freq: Every evening | ORAL | Status: DC | PRN

## 2014-12-31 NOTE — Telephone Encounter (Signed)
Incoming fax requesting Xanax for mail order---form placed in your inbox

## 2014-12-31 NOTE — Telephone Encounter (Signed)
Printed and in Kim's box 

## 2015-01-04 NOTE — Telephone Encounter (Signed)
Rx faxed to [facility].

## 2015-04-18 ENCOUNTER — Other Ambulatory Visit: Payer: Self-pay | Admitting: Family Medicine

## 2015-09-26 ENCOUNTER — Ambulatory Visit (INDEPENDENT_AMBULATORY_CARE_PROVIDER_SITE_OTHER): Payer: BLUE CROSS/BLUE SHIELD | Admitting: Family Medicine

## 2015-09-26 ENCOUNTER — Telehealth: Payer: Self-pay | Admitting: Family Medicine

## 2015-09-26 ENCOUNTER — Other Ambulatory Visit: Payer: Self-pay | Admitting: Family Medicine

## 2015-09-26 ENCOUNTER — Encounter: Payer: Self-pay | Admitting: Family Medicine

## 2015-09-26 VITALS — BP 136/84 | HR 68 | Temp 97.3°F | Wt 210.2 lb

## 2015-09-26 DIAGNOSIS — E785 Hyperlipidemia, unspecified: Secondary | ICD-10-CM

## 2015-09-26 DIAGNOSIS — R238 Other skin changes: Secondary | ICD-10-CM

## 2015-09-26 DIAGNOSIS — M810 Age-related osteoporosis without current pathological fracture: Secondary | ICD-10-CM | POA: Diagnosis not present

## 2015-09-26 DIAGNOSIS — R040 Epistaxis: Secondary | ICD-10-CM

## 2015-09-26 DIAGNOSIS — I10 Essential (primary) hypertension: Secondary | ICD-10-CM

## 2015-09-26 DIAGNOSIS — R233 Spontaneous ecchymoses: Secondary | ICD-10-CM

## 2015-09-26 DIAGNOSIS — R7989 Other specified abnormal findings of blood chemistry: Secondary | ICD-10-CM | POA: Insufficient documentation

## 2015-09-26 DIAGNOSIS — Z1159 Encounter for screening for other viral diseases: Secondary | ICD-10-CM

## 2015-09-26 DIAGNOSIS — Z114 Encounter for screening for human immunodeficiency virus [HIV]: Secondary | ICD-10-CM

## 2015-09-26 LAB — CBC WITH DIFFERENTIAL/PLATELET
Basophils Absolute: 0 10*3/uL (ref 0.0–0.1)
Basophils Relative: 0.7 % (ref 0.0–3.0)
Eosinophils Absolute: 0.2 10*3/uL (ref 0.0–0.7)
Eosinophils Relative: 3.3 % (ref 0.0–5.0)
HCT: 43.2 % (ref 39.0–52.0)
Hemoglobin: 14.7 g/dL (ref 13.0–17.0)
Lymphocytes Relative: 21.7 % (ref 12.0–46.0)
Lymphs Abs: 1.5 10*3/uL (ref 0.7–4.0)
MCHC: 34 g/dL (ref 30.0–36.0)
MCV: 94.9 fl (ref 78.0–100.0)
Monocytes Absolute: 0.5 10*3/uL (ref 0.1–1.0)
Monocytes Relative: 7.4 % (ref 3.0–12.0)
Neutro Abs: 4.8 10*3/uL (ref 1.4–7.7)
Neutrophils Relative %: 66.9 % (ref 43.0–77.0)
Platelets: 264 10*3/uL (ref 150.0–400.0)
RBC: 4.55 Mil/uL (ref 4.22–5.81)
RDW: 13.3 % (ref 11.5–15.5)
WBC: 7.1 10*3/uL (ref 4.0–10.5)

## 2015-09-26 LAB — APTT: aPTT: 28 s (ref 23.4–32.7)

## 2015-09-26 LAB — COMPREHENSIVE METABOLIC PANEL
ALT: 36 U/L (ref 0–53)
AST: 36 U/L (ref 0–37)
Albumin: 4.5 g/dL (ref 3.5–5.2)
Alkaline Phosphatase: 66 U/L (ref 39–117)
BUN: 17 mg/dL (ref 6–23)
CO2: 27 mEq/L (ref 19–32)
Calcium: 9.7 mg/dL (ref 8.4–10.5)
Chloride: 96 mEq/L (ref 96–112)
Creatinine, Ser: 0.8 mg/dL (ref 0.40–1.50)
GFR: 105.44 mL/min (ref >60.00)
Glucose, Bld: 88 mg/dL (ref 70–99)
Potassium: 4.4 mEq/L (ref 3.5–5.1)
Sodium: 135 mEq/L (ref 135–145)
Total Bilirubin: 0.8 mg/dL (ref 0.2–1.2)
Total Protein: 8 g/dL (ref 6.0–8.3)

## 2015-09-26 LAB — LIPID PANEL
Cholesterol: 214 mg/dL — ABNORMAL HIGH (ref 0–200)
HDL: 100.6 mg/dL (ref >39.00)
LDL Cholesterol: 101 mg/dL — ABNORMAL HIGH (ref 0–99)
NonHDL: 113.11
Total CHOL/HDL Ratio: 2
Triglycerides: 60 mg/dL (ref 0.0–149.0)
VLDL: 12 mg/dL (ref 0.0–40.0)

## 2015-09-26 LAB — PROTIME-INR
INR: 1.1 ratio — ABNORMAL HIGH (ref 0.8–1.0)
Prothrombin Time: 11 s (ref 9.6–13.1)

## 2015-09-26 LAB — VITAMIN D 25 HYDROXY (VIT D DEFICIENCY, FRACTURES): VITD: 35.12 ng/mL (ref 30.00–100.00)

## 2015-09-26 NOTE — Telephone Encounter (Signed)
Received call from Dr Ernesto Rutherford ENT - requests eval for ongoing epistaxis without frank lesion. Plz place patient at 12:30pm today. Pt aware. Thanks.

## 2015-09-26 NOTE — Progress Notes (Signed)
Pre visit review using our clinic review tool, if applicable. No additional management support is needed unless otherwise documented below in the visit note. 

## 2015-09-26 NOTE — Patient Instructions (Addendum)
Labs today. We will be in touch with results.  We will also fax results to Dr Berle Mull office.

## 2015-09-26 NOTE — Progress Notes (Signed)
BP 136/84   Pulse 68   Temp 97.3 F (36.3 C) (Oral)   Wt 210 lb 4 oz (95.4 kg)   BMI 29.95 kg/m    CC: f/u epistaxis Subjective:    [name], male    DOB: [birth year], 60 y.o.   MRN: [id number]  HPI: Louis [name] is a 60 y.o. male presenting on 09/26/2015 for Epistaxis (x 5-6 weeks)   I received call this morning from Dr Ernesto Rutherford to see patient as acute visit today for recurrent and persistent epistaxis L>R (ongoing past 5-6 wks) without obvious etiology found on ENT exam, requested clotting evaluation including PTT/PT/INR. He did have friable blood vessels on evaluation however. ENT notes available reviewed.   Noticing epistaxis more commonly after exercising in the mornings.  He did notice large R forearm bruise after 2 episodes of golfing (mid August).  He has noticed some blood in semen with ejaculation - a few times in the past year.  Denies fevers, headaches, chest pain, tightness, cough, dyspnea.  Not on blood thinner.  No blood in stool or urine. No easy bleeding from gums.  No fmhx clotting disorder.   Prior on flonase for allergies - stopped when nosebleeds started.   3 antibiotic courses over last 2 months - initial ceftin for cellulitis/eczema, then treated with doxy and then azithromycin for concern for recurrent sinusitis. Just finished antibiotic course.   He did have nasal septal surgery for deviated septum 2013, recovered well. No trouble with prior surgeries.  Relevant past medical, surgical, family and social history reviewed and updated as indicated. Interim medical history since our last visit reviewed. Allergies and medications reviewed and updated. Current Outpatient Prescriptions on File Prior to Visit  Medication Sig  . albuterol (PROVENTIL HFA;VENTOLIN HFA) 108 (90 BASE) MCG/ACT inhaler Inhale 2 puffs into the lungs every 4 (four) hours as needed for wheezing or shortness of breath.  . ALPRAZolam (XANAX) 0.5 MG tablet Take 1 tablet (0.5  mg total) by mouth at bedtime as needed for sleep.  [name] amLODipine (NORVASC) 5 MG tablet Take 1 tablet (5 mg total) by mouth daily.  . fluticasone (FLONASE) 50 MCG/ACT nasal spray Place 2 sprays into both nostrils daily.  . metoprolol succinate (TOPROL-XL) 100 MG 24 hr tablet take 1 tablet by mouth once daily  . valsartan (DIOVAN) 320 MG tablet Take 1 tablet (320 mg total) by mouth daily.  . Vitamin D, Ergocalciferol, (DRISDOL) 50000 UNITS CAPS capsule Take 50,000 Units by mouth every 7 (seven) days.   No current facility-administered medications on file prior to visit.     Review of Systems Per HPI unless specifically indicated in ROS section     Objective:    BP 136/84   Pulse 68   Temp 97.3 F (36.3 C) (Oral)   Wt 210 lb 4 oz (95.4 kg)   BMI 29.95 kg/m   Wt Readings from Last 3 Encounters:  09/26/15 210 lb 4 oz (95.4 kg)  10/08/14 219 lb 8 oz (99.6 kg)    Physical Exam  Constitutional: He appears well-developed and well-nourished. No distress.  HENT:  Head: Normocephalic and atraumatic.  Mouth/Throat: Oropharynx is clear and moist. No oropharyngeal exudate.  Nose currently packed  Eyes: Conjunctivae and EOM are normal. Pupils are equal, round, and reactive to light. No scleral icterus.  Neck: Normal range of motion. No thyromegaly present.  Cardiovascular: Normal rate, regular rhythm, normal heart sounds and intact distal pulses.   No murmur  heard. Pulmonary/Chest: Effort normal and breath sounds normal. No respiratory distress. He has no wheezes. He has no rales.  Abdominal: Soft. Normal appearance and bowel sounds are normal. He exhibits no distension. There is no hepatosplenomegaly. There is no tenderness. There is no rebound and no guarding.  Musculoskeletal: He exhibits no edema.  Thoracic scoliosis present  Lymphadenopathy:    He has no cervical adenopathy.  Nursing note and vitals reviewed.  Results for orders placed or performed in visit on 11/29/14    Comprehensive metabolic panel  Result Value Ref Range   Glucose 90    Creat 1.0    Potassium 5.3 mmol/L   AST 73 U/L   ALT 73    Alkaline Phosphatase 70 U/L   Total Bilirubin 0.7 mg/dL  CBC  Result Value Ref Range   WBC 3.40    HGB 13.3 g/dL   platelet count 281   Lipid panel  Result Value Ref Range   Cholesterol 204    Triglycerides 51    HDL Cholesterol 75    LDL (calc) 119   TSH  Result Value Ref Range   TSH 0.80   PSA  Result Value Ref Range   PSA 4.307   VITAMIN D 25 Hydroxy (Vit-D Deficiency, Fractures)  Result Value Ref Range   Vit D, 25-Hydroxy 67.1   Microalbumin / creatinine urine ratio  Result Value Ref Range   Albumin/Creatinine Ratio, Urine, POC 5.2   PSA  Result Value Ref Range   PSA 3.19       Assessment & Plan:  Update screening HIV/Hep C labs pending CPE Problem List Items Addressed This Visit    Epistaxis - Primary    Prolonged mucosal friability per ENT that seems limited to nasal passage (x6wks). Pt also endorses episode of large spontaneous bruising R forearm after golfing several weeks ago. Will eval bleeding function with PT/INR, aPTT and CBC. If all normal, will check bleeding time/platelet function assay. Will update pt and ENT with results.      Relevant Orders   Protime-INR   Comprehensive metabolic panel   CBC with Differential/Platelet   APTT   HLD (hyperlipidemia)    Update labs pending CPE.      Relevant Orders   Lipid panel   HTN (hypertension)   Relevant Orders   Comprehensive metabolic panel   Osteoporosis   Relevant Orders   VITAMIN D 25 Hydroxy (Vit-D Deficiency, Fractures)    Other Visit Diagnoses    Easy bruising       Relevant Orders   Protime-INR   Comprehensive metabolic panel   CBC with Differential/Platelet   APTT   Need for hepatitis C screening test       Relevant Orders   Hepatitis C antibody   Encounter for screening for HIV       Relevant Orders   HIV antibody       Follow up  plan: Return if symptoms worsen or fail to improve.  Ria Bush, MD

## 2015-09-26 NOTE — Assessment & Plan Note (Signed)
Update labs pending CPE.

## 2015-09-26 NOTE — Assessment & Plan Note (Addendum)
Prolonged mucosal friability per ENT that seems limited to nasal passage (x6wks). Pt also endorses episode of large spontaneous bruising R forearm after golfing several weeks ago. Will eval bleeding function with PT/INR, aPTT and CBC. If all normal, will check bleeding time/platelet function assay. Will update pt and ENT with results.

## 2015-09-26 NOTE — Telephone Encounter (Signed)
Added

## 2015-09-27 ENCOUNTER — Other Ambulatory Visit: Payer: Self-pay | Admitting: Family Medicine

## 2015-09-27 DIAGNOSIS — R233 Spontaneous ecchymoses: Secondary | ICD-10-CM

## 2015-09-27 DIAGNOSIS — R238 Other skin changes: Secondary | ICD-10-CM

## 2015-09-27 DIAGNOSIS — R040 Epistaxis: Secondary | ICD-10-CM

## 2015-09-27 LAB — HEPATITIS C ANTIBODY: HCV Ab: NEGATIVE

## 2015-09-27 LAB — HIV ANTIBODY (ROUTINE TESTING W REFLEX): HIV 1&2 Ab, 4th Generation: NONREACTIVE

## 2015-09-28 ENCOUNTER — Other Ambulatory Visit: Payer: BLUE CROSS/BLUE SHIELD

## 2015-09-28 ENCOUNTER — Telehealth: Payer: Self-pay | Admitting: *Deleted

## 2015-09-28 NOTE — Telephone Encounter (Signed)
Pt called and spoke with Louis [name], stating that he went to [facility] lab and was told they couldn't do the test there. He was told he would have to go to a [place] lab. He called to verify if this was correct and if so have an order sent to [facility].   I called [facility] lab and was told that The PFA was not done locally because the test time sensitive and has to be run within 4 hours of blood being drawn. The only hospital that runs the test ( for them) near here is in [place]. I also called [facility], and they don't run the test either.  I called pt back to let him know not to go to the lab, that I would let you know that the test can't be done locally, and that we would call him with whatever you advise.

## 2015-09-28 NOTE — Telephone Encounter (Signed)
Noted. I sent pt a mychart message. Will recommend f/u with Dr Ernesto Rutherford after initial normal labwork. Offered heme referral.

## 2015-09-29 NOTE — Telephone Encounter (Signed)
Tried to call home and cell to touch base with patient. No answer, voice mail full.

## 2015-10-14 ENCOUNTER — Encounter: Payer: Self-pay | Admitting: Family Medicine

## 2015-10-14 ENCOUNTER — Ambulatory Visit (INDEPENDENT_AMBULATORY_CARE_PROVIDER_SITE_OTHER): Payer: BLUE CROSS/BLUE SHIELD | Admitting: Family Medicine

## 2015-10-14 ENCOUNTER — Telehealth: Payer: Self-pay

## 2015-10-14 VITALS — BP 126/82 | HR 72 | Temp 98.2°F | Ht 70.25 in | Wt 210.8 lb

## 2015-10-14 DIAGNOSIS — E669 Obesity, unspecified: Secondary | ICD-10-CM | POA: Diagnosis not present

## 2015-10-14 DIAGNOSIS — M4124 Other idiopathic scoliosis, thoracic region: Secondary | ICD-10-CM | POA: Diagnosis not present

## 2015-10-14 DIAGNOSIS — M818 Other osteoporosis without current pathological fracture: Secondary | ICD-10-CM

## 2015-10-14 DIAGNOSIS — Z0000 Encounter for general adult medical examination without abnormal findings: Secondary | ICD-10-CM | POA: Diagnosis not present

## 2015-10-14 DIAGNOSIS — I10 Essential (primary) hypertension: Secondary | ICD-10-CM

## 2015-10-14 DIAGNOSIS — R040 Epistaxis: Secondary | ICD-10-CM

## 2015-10-14 DIAGNOSIS — R972 Elevated prostate specific antigen [PSA]: Secondary | ICD-10-CM

## 2015-10-14 DIAGNOSIS — I701 Atherosclerosis of renal artery: Secondary | ICD-10-CM

## 2015-10-14 MED ORDER — METOPROLOL SUCCINATE ER 100 MG PO TB24: ORAL_TABLET | ORAL | 6 refills | Status: DC

## 2015-10-14 NOTE — Assessment & Plan Note (Signed)
Pt unaware of this, reviewed. BP well controlled at this time.

## 2015-10-14 NOTE — Telephone Encounter (Signed)
Pt wants to know if Dr Darnell Level would send 90 day rx metoprolol 50 mg taking 3 tabs daily to [facility] instead of having to cut the 100 mg tab.Please advise. Pt request cb.

## 2015-10-14 NOTE — Assessment & Plan Note (Signed)
Preventative protocols reviewed and updated unless pt declined. Discussed healthy diet and lifestyle.  

## 2015-10-14 NOTE — Assessment & Plan Note (Signed)
Planning on restarting exercise when cleared by ENT

## 2015-10-14 NOTE — Progress Notes (Signed)
Pre visit review using our clinic review tool, if applicable. No additional management support is needed unless otherwise documented below in the visit note. 

## 2015-10-14 NOTE — Assessment & Plan Note (Signed)
Reviewed calcium and vitamin D use. rec restart 50,000 IU weekly.

## 2015-10-14 NOTE — Assessment & Plan Note (Addendum)
Continue yearly f/u with uro

## 2015-10-14 NOTE — Assessment & Plan Note (Signed)
Appreciate ENT care. This has resolved with abx ointment use. Has f/u with ENT next week.

## 2015-10-14 NOTE — Assessment & Plan Note (Signed)
Predominant high HDL. No need for medications at this time.

## 2015-10-14 NOTE — Patient Instructions (Addendum)
Check on Toprol XL cost and let me know if you'd like 50mg  tablets as well. You are doing well today. Return as needed or in 1 year for next physical.  Health Maintenance, Male A healthy lifestyle and preventative care can promote health and wellness.  Maintain regular health, dental, and eye exams.  Eat a healthy diet. Foods like vegetables, fruits, whole grains, low-fat dairy products, and lean protein foods contain the nutrients you need and are low in calories. Decrease your intake of foods high in solid fats, added sugars, and salt. Get information about a proper diet from your health care provider, if necessary.  Regular physical exercise is one of the most important things you can do for your health. Most adults should get at least 150 minutes of moderate-intensity exercise (any activity that increases your heart rate and causes you to sweat) each week. In addition, most adults need muscle-strengthening exercises on 2 or more days a week.   Maintain a healthy weight. The body mass index (BMI) is a screening tool to identify possible weight problems. It provides an estimate of body fat based on height and weight. Your health care provider can find your BMI and can help you achieve or maintain a healthy weight. For males 20 years and older:  A BMI below 18.5 is considered underweight.  A BMI of 18.5 to 24.9 is normal.  A BMI of 25 to 29.9 is considered overweight.  A BMI of 30 and above is considered obese.  Maintain normal blood lipids and cholesterol by exercising and minimizing your intake of saturated fat. Eat a balanced diet with plenty of fruits and vegetables. Blood tests for lipids and cholesterol should begin at age 70 and be repeated every 5 years. If your lipid or cholesterol levels are high, you are over age 83, or you are at high risk for heart disease, you may need your cholesterol levels checked more frequently.Ongoing high lipid and cholesterol levels should be treated  with medicines if diet and exercise are not working.  If you smoke, find out from your health care provider how to quit. If you do not use tobacco, do not start.  Lung cancer screening is recommended for adults aged 40-80 years who are at high risk for developing lung cancer because of a history of smoking. A yearly low-dose CT scan of the lungs is recommended for people who have at least a 30-pack-year history of smoking and are current smokers or have quit within the past 15 years. A pack year of smoking is smoking an average of 1 pack of cigarettes a day for 1 year (for example, a 30-pack-year history of smoking could mean smoking 1 pack a day for 30 years or 2 packs a day for 15 years). Yearly screening should continue until the smoker has stopped smoking for at least 15 years. Yearly screening should be stopped for people who develop a health problem that would prevent them from having lung cancer treatment.  If you choose to drink alcohol, do not have more than 2 drinks per day. One drink is considered to be 12 oz (360 mL) of beer, 5 oz (150 mL) of wine, or 1.5 oz (45 mL) of liquor.  Avoid the use of street drugs. Do not share needles with anyone. Ask for help if you need support or instructions about stopping the use of drugs.  High blood pressure causes heart disease and increases the risk of stroke. High blood pressure is more likely to  develop in:  People who have blood pressure in the end of the normal range (100-139/85-89 mm Hg).  People who are overweight or obese.  People who are African American.  If you are 57-35 years of age, have your blood pressure checked every 3-5 years. If you are 44 years of age or older, have your blood pressure checked every year. You should have your blood pressure measured twice--once when you are at a hospital or clinic, and once when you are not at a hospital or clinic. Record the average of the two measurements. To check your blood pressure when you  are not at a hospital or clinic, you can use:  An automated blood pressure machine at a pharmacy.  A home blood pressure monitor.  If you are 60-74 years old, ask your health care provider if you should take aspirin to prevent heart disease.  Diabetes screening involves taking a blood sample to check your fasting blood sugar level. This should be done once every 3 years after age 90 if you are at a normal weight and without risk factors for diabetes. Testing should be considered at a younger age or be carried out more frequently if you are overweight and have at least 1 risk factor for diabetes.  Colorectal cancer can be detected and often prevented. Most routine colorectal cancer screening begins at the age of 18 and continues through age 79. However, your health care provider may recommend screening at an earlier age if you have risk factors for colon cancer. On a yearly basis, your health care provider may provide home test kits to check for hidden blood in the stool. A small camera at the end of a tube may be used to directly examine the colon (sigmoidoscopy or colonoscopy) to detect the earliest forms of colorectal cancer. Talk to your health care provider about this at age 77 when routine screening begins. A direct exam of the colon should be repeated every 5-10 years through age 52, unless early forms of precancerous polyps or small growths are found.  People who are at an increased risk for hepatitis B should be screened for this virus. You are considered at high risk for hepatitis B if:  You were born in a country where hepatitis B occurs often. Talk with your health care provider about which countries are considered high risk.  Your parents were born in a high-risk country and you have not received a shot to protect against hepatitis B (hepatitis B vaccine).  You have HIV or AIDS.  You use needles to inject street drugs.  You live with, or have sex with, someone who has hepatitis  B.  You are a man who has sex with other men (MSM).  You get hemodialysis treatment.  You take certain medicines for conditions like cancer, organ transplantation, and autoimmune conditions.  Hepatitis C blood testing is recommended for all people born from 18 through 1965 and any individual with known risk factors for hepatitis C.  Healthy men should no longer receive prostate-specific antigen (PSA) blood tests as part of routine cancer screening. Talk to your health care provider about prostate cancer screening.  Testicular cancer screening is not recommended for adolescents or adult males who have no symptoms. Screening includes self-exam, a health care provider exam, and other screening tests. Consult with your health care provider about any symptoms you have or any concerns you have about testicular cancer.  Practice safe sex. Use condoms and avoid high-risk sexual practices to reduce  the spread of sexually transmitted infections (STIs).  You should be screened for STIs, including gonorrhea and chlamydia if:  You are sexually active and are younger than 24 years.  You are older than 24 years, and your health care provider tells you that you are at risk for this type of infection.  Your sexual activity has changed since you were last screened, and you are at an increased risk for chlamydia or gonorrhea. Ask your health care provider if you are at risk.  If you are at risk of being infected with HIV, it is recommended that you take a prescription medicine daily to prevent HIV infection. This is called pre-exposure prophylaxis (PrEP). You are considered at risk if:  You are a man who has sex with other men (MSM).  You are a heterosexual man who is sexually active with multiple partners.  You take drugs by injection.  You are sexually active with a partner who has HIV.  Talk with your health care provider about whether you are at high risk of being infected with HIV. If you  choose to begin PrEP, you should first be tested for HIV. You should then be tested every 3 months for as long as you are taking PrEP.  Use sunscreen. Apply sunscreen liberally and repeatedly throughout the day. You should seek shade when your shadow is shorter than you. Protect yourself by wearing long sleeves, pants, a wide-brimmed hat, and sunglasses year round whenever you are outdoors.  Tell your health care provider of new moles or changes in moles, especially if there is a change in shape or color. Also, tell your health care provider if a mole is larger than the size of a pencil eraser.  A one-time screening for abdominal aortic aneurysm (AAA) and surgical repair of large AAAs by ultrasound is recommended for men aged 3-75 years who are current or former smokers.  Stay current with your vaccines (immunizations).   This information is not intended to replace advice given to you by your health care provider. Make sure you discuss any questions you have with your health care provider.   Document Released: 06/16/2007 Document Revised: 01/08/2014 Document Reviewed: 05/15/2010 Elsevier Interactive Patient Education [employer].

## 2015-10-14 NOTE — Assessment & Plan Note (Addendum)
Chronic, stable. Continue current regimen. He increased toprol XL to 150mg  daily with better control. He will check with insurance on best option for continued dose (50mg  3 tab daily vs 100mg  + 50mg  tablets) and get back to me.

## 2015-10-14 NOTE — Progress Notes (Signed)
BP 126/82   Pulse 72   Temp 98.2 F (36.8 C) (Oral)   Ht 5' 10.25" (1.784 m)   Wt 210 lb 12 oz (95.6 kg)   BMI 30.02 kg/m    CC: CPE Subjective:    [name], male    DOB: [birth year], 60 y.o.   MRN: [id number]  HPI: Louis [name] is a 60 y.o. male presenting on 10/14/2015 for Annual Exam   See prior note for details - recent ENT eval for recurrent epistaxis with recent stable coagulability testing. Bleeding has stabilized, now using antibiotic ointment in nasal passages.  H/o osteoporosis improved to osteopenia from chronic steroid use (h/o asthma), completed a few years of reclast. Last DEXA 2015.   Preventative: COLONOSCOPY Date: 2015 polyps, int hem, diverticulosis, rpt 5 yrs [name]) Prostate screening - h/o elevated, sees Dr Diona Fanti. S/p benign biopsy 2014, attributed to BPH. Dr Diona Fanti checks prostate levels. Sees yearly. Flu shot yearly Pneumovax 2010 Tdap 2008 zostavax 2013 Seat belt use discussed Sunscreen use discussed. no changing moles. 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 Interim medical history since our last visit reviewed. Allergies and medications reviewed and updated. Current Outpatient Prescriptions on File Prior to Visit  Medication Sig  . albuterol (PROVENTIL HFA;VENTOLIN HFA) 108 (90 BASE) MCG/ACT inhaler Inhale 2 puffs into the lungs every 4 (four) hours as needed for wheezing or shortness of breath.  . ALPRAZolam (XANAX) 0.5 MG tablet Take 1 tablet (0.5 mg total) by mouth at bedtime as needed for sleep.  [name] amLODipine (NORVASC) 5 MG tablet Take 1 tablet (5 mg total) by mouth daily.  . fluticasone (FLONASE) 50 MCG/ACT nasal spray  Place 2 sprays into both nostrils daily.  . valsartan (DIOVAN) 320 MG tablet Take 1 tablet (320 mg total) by mouth daily.  . Vitamin D, Ergocalciferol, (DRISDOL) 50000 UNITS CAPS capsule Take 50,000 Units by mouth every 7 (seven) days.   No current facility-administered medications on file prior to visit.     Review of Systems  Constitutional: Negative for activity change, appetite change, chills, fatigue, fever and unexpected weight change.  HENT: Positive for nosebleeds (see HPI). Negative for hearing loss.   Eyes: Negative for visual disturbance.  Respiratory: Negative for cough, chest tightness, shortness of breath and wheezing.   Cardiovascular: Negative for chest pain, palpitations and leg swelling.  Gastrointestinal: Negative for abdominal distention, abdominal pain, blood in stool, constipation, diarrhea, nausea and vomiting.  Genitourinary: Negative for difficulty urinating and hematuria.  Musculoskeletal: Negative for arthralgias, myalgias and neck pain.  Skin: Negative for rash.  Neurological: Negative for dizziness, seizures, syncope and headaches.  Hematological: Negative for adenopathy. Does not bruise/bleed easily.  Psychiatric/Behavioral: Negative for dysphoric mood. The patient is not nervous/anxious.    Per HPI unless specifically indicated in ROS section     Objective:    BP 126/82   Pulse 72   Temp 98.2 F (36.8 C) (Oral)   Ht 5' 10.25" (1.784 m)   Wt 210 lb 12 oz (95.6 kg)   BMI 30.02 kg/m   Wt Readings from Last 3 Encounters:  10/14/15 210 lb 12 oz (95.6 kg)  09/26/15 210 lb 4 oz (95.4 kg)  10/08/14 219  lb 8 oz (99.6 kg)    Physical Exam  Constitutional: He is oriented to person, place, and time. He appears well-developed and well-nourished. No distress.  HENT:  Head: Normocephalic and atraumatic.  Right Ear: Hearing, tympanic membrane, external ear and ear canal normal.  Left Ear: Hearing, tympanic membrane, external ear and ear canal normal.  Nose:  Nose normal.  Mouth/Throat: Uvula is midline, oropharynx is clear and moist and mucous membranes are normal. No oropharyngeal exudate, posterior oropharyngeal edema or posterior oropharyngeal erythema.  Eyes: Conjunctivae and EOM are normal. Pupils are equal, round, and reactive to light. No scleral icterus.  Neck: Normal range of motion. Neck supple. No thyromegaly present.  Cardiovascular: Normal rate, regular rhythm, normal heart sounds and intact distal pulses.   No murmur heard. Pulses:      Radial pulses are 2+ on the right side, and 2+ on the left side.  Pulmonary/Chest: Effort normal and breath sounds normal. No respiratory distress. He has no wheezes. He has no rales.  Abdominal: Soft. Bowel sounds are normal. He exhibits no distension and no mass. There is no tenderness. There is no rebound and no guarding.  Musculoskeletal: Normal range of motion. He exhibits no edema.  Lymphadenopathy:    He has no cervical adenopathy.  Neurological: He is alert and oriented to person, place, and time.  CN grossly intact, station and gait intact  Skin: Skin is warm and dry. No rash noted.  Psychiatric: He has a normal mood and affect. His behavior is normal. Judgment and thought content normal.  Nursing note and vitals reviewed.  Results for orders placed or performed in visit on 09/26/15  Protime-INR  Result Value Ref Range   INR 1.1 (H) 0.8 - 1.0 ratio   Prothrombin Time 11.0 9.6 - 13.1 sec  VITAMIN D 25 Hydroxy (Vit-D Deficiency, Fractures)  Result Value Ref Range   VITD 35.12 30.00 - 100.00 ng/mL  Comprehensive metabolic panel  Result Value Ref Range   Sodium 135 135 - 145 mEq/L   Potassium 4.4 3.5 - 5.1 mEq/L   Chloride 96 96 - 112 mEq/L   CO2 27 19 - 32 mEq/L   Glucose, Bld 88 70 - 99 mg/dL   BUN 17 6 - 23 mg/dL   Creatinine, Ser 0.80 0.40 - 1.50 mg/dL   Total Bilirubin 0.8 0.2 - 1.2 mg/dL   Alkaline Phosphatase 66 39 - 117 U/L   AST 36 0 - 37 U/L   ALT 36 0 - 53 U/L   Total  Protein 8.0 6.0 - 8.3 g/dL   Albumin 4.5 3.5 - 5.2 g/dL   Calcium 9.7 8.4 - 10.5 mg/dL   GFR 105.44 >60.00 mL/min  CBC with Differential/Platelet  Result Value Ref Range   WBC 7.1 4.0 - 10.5 K/uL   RBC 4.55 4.22 - 5.81 Mil/uL   Hemoglobin 14.7 13.0 - 17.0 g/dL   HCT 43.2 39.0 - 52.0 %   MCV 94.9 78.0 - 100.0 fl   MCHC 34.0 30.0 - 36.0 g/dL   RDW 13.3 11.5 - 15.5 %   Platelets 264.0 150.0 - 400.0 K/uL   Neutrophils Relative % 66.9 43.0 - 77.0 %   Lymphocytes Relative 21.7 12.0 - 46.0 %   Monocytes Relative 7.4 3.0 - 12.0 %   Eosinophils Relative 3.3 0.0 - 5.0 %   Basophils Relative 0.7 0.0 - 3.0 %   Neutro Abs 4.8 1.4 - 7.7 K/uL   Lymphs Abs 1.5 0.7 - 4.0 K/uL  Monocytes Absolute 0.5 0.1 - 1.0 K/uL   Eosinophils Absolute 0.2 0.0 - 0.7 K/uL   Basophils Absolute 0.0 0.0 - 0.1 K/uL  Lipid panel  Result Value Ref Range   Cholesterol 214 (H) 0 - 200 mg/dL   Triglycerides 60.0 0.0 - 149.0 mg/dL   HDL 100.60 >39.00 mg/dL   VLDL 12.0 0.0 - 40.0 mg/dL   LDL Cholesterol 101 (H) 0 - 99 mg/dL   Total CHOL/HDL Ratio 2    NonHDL 113.11   HIV antibody  Result Value Ref Range   HIV 1&2 Ab, 4th Generation NONREACTIVE NONREACTIVE  Hepatitis C antibody  Result Value Ref Range   HCV Ab NEGATIVE NEGATIVE  APTT  Result Value Ref Range   aPTT 28.0 23.4 - 32.7 SEC      Assessment & Plan:   Problem List Items Addressed This Visit    Elevated PSA    Continue yearly f/u with uro      Epistaxis    Appreciate ENT care. This has resolved with abx ointment use. Has f/u with ENT next week.      Health maintenance examination - Primary    Preventative protocols reviewed and updated unless pt declined. Discussed healthy diet and lifestyle.       HTN (hypertension)    Chronic, stable. Continue current regimen. He increased toprol XL to 150mg  daily with better control. He will check with insurance on best option for continued dose (50mg  3 tab daily vs 100mg  + 50mg  tablets) and get back to  me.      Relevant Medications   metoprolol succinate (TOPROL-XL) 100 MG 24 hr tablet   Obesity, Class I, BMI 30-34.9    Planning on restarting exercise when cleared by ENT      Osteoporosis    Reviewed calcium and vitamin D use. rec restart 50,000 IU weekly.       Renal artery stenosis in 1 of 2 vessels (HCC)    Pt unaware of this, reviewed. BP well controlled at this time.      Relevant Medications   metoprolol succinate (TOPROL-XL) 100 MG 24 hr tablet   Scoliosis    Other Visit Diagnoses   None.      Follow up plan: Return in about 1 year (around 10/13/2016), or as needed, for annual exam, prior fasting for blood work.  Ria Bush, MD

## 2015-10-16 MED ORDER — METOPROLOL SUCCINATE ER 50 MG PO TB24: 150.0000 mg | ORAL_TABLET | Freq: Every day | ORAL | 3 refills | Status: DC

## 2015-10-16 NOTE — Telephone Encounter (Signed)
plz notify this was sent in to prime mail.

## 2015-10-17 NOTE — Telephone Encounter (Signed)
Left detailed message on voicemail [facility]) that script has been sent to pharmacy as instructed.

## 2015-11-02 ENCOUNTER — Other Ambulatory Visit: Payer: Self-pay | Admitting: *Deleted

## 2015-11-02 MED ORDER — VALSARTAN 320 MG PO TABS: 320.0000 mg | ORAL_TABLET | Freq: Every day | ORAL | 3 refills | Status: DC

## 2015-11-14 ENCOUNTER — Encounter: Payer: Self-pay | Admitting: Family Medicine

## 2015-11-14 MED ORDER — AMLODIPINE BESYLATE 5 MG PO TABS: 5.0000 mg | ORAL_TABLET | Freq: Every day | ORAL | 3 refills | Status: DC

## 2016-01-26 ENCOUNTER — Encounter: Payer: Self-pay | Admitting: Family Medicine

## 2016-02-21 ENCOUNTER — Telehealth: Payer: Self-pay | Admitting: Family Medicine

## 2016-02-21 NOTE — Telephone Encounter (Signed)
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 He said he doesn't need the medical record and doesn't have a specific form, he just needs a report stating he is in satisfactory health. Please print and leave up front. Call when ready to pick up.

## 2016-02-21 NOTE — Telephone Encounter (Signed)
Letter written and in [facility] box. 

## 2016-02-22 NOTE — Telephone Encounter (Signed)
Message left on patients voice mail stating that letter was ready to be picked up. Okay per [facility]

## 2016-03-01 ENCOUNTER — Other Ambulatory Visit: Payer: Self-pay | Admitting: Family Medicine

## 2016-03-01 MED ORDER — ALPRAZOLAM 0.5 MG PO TABS: 0.5000 mg | ORAL_TABLET | Freq: Every evening | ORAL | 0 refills | Status: DC | PRN

## 2016-03-01 NOTE — Telephone Encounter (Signed)
Ok to refill? Last filled 12/31/14 #30 0RF

## 2016-03-01 NOTE — Telephone Encounter (Signed)
plz phone in. 

## 2016-03-01 NOTE — Telephone Encounter (Addendum)
Actually I just realized this was for mail order. Printed and in your IN box for you to sign. Thanks

## 2016-03-01 NOTE — Addendum Note (Signed)
Addended by: Royann Shivers A on: 03/01/2016 05:50 PM   Modules accepted: Orders

## 2016-03-02 NOTE — Telephone Encounter (Signed)
Rx faxed

## 2016-03-02 NOTE — Telephone Encounter (Signed)
Printed and in Kim's box 

## 2016-03-13 ENCOUNTER — Ambulatory Visit (INDEPENDENT_AMBULATORY_CARE_PROVIDER_SITE_OTHER): Payer: Self-pay

## 2016-03-13 ENCOUNTER — Ambulatory Visit (INDEPENDENT_AMBULATORY_CARE_PROVIDER_SITE_OTHER): Payer: BLUE CROSS/BLUE SHIELD | Admitting: Orthopaedic Surgery

## 2016-03-13 ENCOUNTER — Encounter (INDEPENDENT_AMBULATORY_CARE_PROVIDER_SITE_OTHER): Payer: Self-pay | Admitting: Orthopaedic Surgery

## 2016-03-13 VITALS — BP 165/89 | HR 73 | Resp 14 | Ht 71.0 in | Wt 200.0 lb

## 2016-03-13 DIAGNOSIS — M79671 Pain in right foot: Secondary | ICD-10-CM

## 2016-03-13 NOTE — Progress Notes (Signed)
Office Visit Note   Patient: Louis [name]           Date of Birth: 07-14-1955           MRN: [id number] Visit Date: 03/13/2016              Requested by: Ria Bush, MD [address] [place], [place] [address] PCP: Ria Bush, MD   Assessment & Plan: Visit Diagnoses:  1. Pain in right foot   Probable midfoot sprain  Plan: Pennsaid gel, comfortable shoes, arch supports, follow-up 10-14 days if no improvement  Follow-Up Instructions: No Follow-up on file.   Orders:  Orders Placed This Encounter  Procedures  . XR Foot Complete Right   No orders of the defined types were placed in this encounter.     Procedures: No procedures performed   Clinical Data: No additional findings.   Subjective: No chief complaint on file.   Mr. Slape presents with Right foot pain that started x 5 days. Pt relates he was wearing "dress" shoes and noticed the pain in his arch and great right toe. Pt has not the problem when he wears regular shoes. Does not were any orthotic suppports  Has chronic history of severe pronation both feet and eczema involving both feet. One inserts in the past with relief. No specific injury or trauma.  Review of Systems   Objective: Vital Signs: There were no vitals taken for this visit.  Physical Exam  Ortho Exam right foot with severe pronation. Some swelling behind the medial malleolus [place] with venous stasis changes. I did not see any inversion of the heel with standing on the toes. Good capillary refill to toes. Mild pain at midfoot dorsally Eczema skin changes at several locations of the dorsum of the foot. No pain in the plantar aspect of the foot. Sensibility intact.  Specialty Comments:  No specialty comments available.  Imaging: No results found.   PMFS History: Patient Active Problem List   Diagnosis Date Noted  . High serum high density lipoprotein (HDL) 09/26/2015  . Epistaxis 09/26/2015  . Renal artery stenosis  in 1 of 2 vessels ([place])   . Essential tremor   . Elevated PSA   . GERD (gastroesophageal reflux disease)   . Health maintenance examination 10/08/2014  . Anxiety state 10/08/2014  . Obesity, Class I, BMI 30-34.9   . Mild intermittent asthma in adult without complication   . Seasonal allergies   . HTN (hypertension)   . Scoliosis   . Osteoporosis    Past Medical History:  Diagnosis Date  . BPH (benign prostatic hyperplasia)    on flomax  . Bundle branch block    per prior PCP records  . Diverticulosis    by colonoscopy  . Eczema    per prior PCP records  . Elevated PSA 2015   peaked 6s, s/p benign biopsy 2014, sees urology [name] ([place])  . Essential tremor   . GERD (gastroesophageal reflux disease)    per prior PCP records  . History of panic attacks    per prior PCP records  . HTN (hypertension)   . Mild intermittent asthma in adult without complication   . Obesity, Class I, BMI 30-34.9   . Osteoporosis    DEXA 06/2013 with osteopenia - h/o ?wrist/hip fracture from AVN from chronic steroid use (asthma), took reclast for 1 year  . Renal artery stenosis in 1 of 2 vessels ([place]) 2011   by [place]  .  Seasonal allergies   . Thoracic scoliosis childhood  . Transaminitis    per prior PCP records    Family History  Problem Relation Age of Onset  . Cancer Father 72    colon  . Diabetes Mother   . Alcohol abuse Mother   . Stroke Mother   . Cancer Maternal Grandmother     pancreatic  . CAD Neg Hx   . Ataxia Brother   . Ataxia Sister     Past Surgical History:  Procedure Laterality Date  . COLONOSCOPY  12/2013   polyps, int hem, diverticulosis, rpt 5 yrs ([place])  . ELBOW SURGERY Right 2008   golfer's elbow  . INGUINAL HERNIA REPAIR Bilateral childhood & ~1995  . NASAL SEPTUM SURGERY  2013   deviated septum  . US ECHOCARDIOGRAPHY  02/2009   WNL, EF >55% [facility])  . US RENAL/AORTA Right 05/2009   1-59% diameter reduction renal artery, rec rpt 2 yrs   Social History     Occupational History  . Not on file.   Social History Main Topics  . Smoking status: Never Smoker  . Smokeless tobacco: Never Used  . Alcohol use 0.0 oz/week     Comment: Social  . Drug use: No  . Sexual activity: Not on file

## 2016-07-30 ENCOUNTER — Other Ambulatory Visit: Payer: Self-pay | Admitting: Family Medicine

## 2016-07-30 ENCOUNTER — Encounter: Payer: Self-pay | Admitting: Family Medicine

## 2016-07-30 ENCOUNTER — Ambulatory Visit (INDEPENDENT_AMBULATORY_CARE_PROVIDER_SITE_OTHER): Payer: PRIVATE HEALTH INSURANCE | Admitting: Family Medicine

## 2016-07-30 VITALS — BP 130/72 | HR 60 | Temp 98.0°F | Wt 214.8 lb

## 2016-07-30 DIAGNOSIS — R21 Rash and other nonspecific skin eruption: Secondary | ICD-10-CM

## 2016-07-30 DIAGNOSIS — L309 Dermatitis, unspecified: Secondary | ICD-10-CM | POA: Insufficient documentation

## 2016-07-30 LAB — COMPREHENSIVE METABOLIC PANEL
ALT: 59 U/L — ABNORMAL HIGH (ref 0–53)
AST: 61 U/L — ABNORMAL HIGH (ref 0–37)
Albumin: 4.6 g/dL (ref 3.5–5.2)
Alkaline Phosphatase: 70 U/L (ref 39–117)
BUN: 19 mg/dL (ref 6–23)
CO2: 27 mEq/L (ref 19–32)
Calcium: 9.6 mg/dL (ref 8.4–10.5)
Chloride: 97 mEq/L (ref 96–112)
Creatinine, Ser: 0.87 mg/dL (ref 0.40–1.50)
GFR: 95.43 mL/min (ref >60.00)
Glucose, Bld: 98 mg/dL (ref 70–99)
Potassium: 5.2 mEq/L — ABNORMAL HIGH (ref 3.5–5.1)
Sodium: 134 mEq/L — ABNORMAL LOW (ref 135–145)
Total Bilirubin: 0.9 mg/dL (ref 0.2–1.2)
Total Protein: 7.6 g/dL (ref 6.0–8.3)

## 2016-07-30 LAB — CBC WITH DIFFERENTIAL/PLATELET
Basophils Absolute: 0.1 10*3/uL (ref 0.0–0.1)
Basophils Relative: 1.3 % (ref 0.0–3.0)
Eosinophils Absolute: 0.1 10*3/uL (ref 0.0–0.7)
Eosinophils Relative: 4 % (ref 0.0–5.0)
HCT: 44.3 % (ref 39.0–52.0)
Hemoglobin: 14.9 g/dL (ref 13.0–17.0)
Lymphocytes Relative: 24 % (ref 12.0–46.0)
Lymphs Abs: 0.9 10*3/uL (ref 0.7–4.0)
MCHC: 33.8 g/dL (ref 30.0–36.0)
MCV: 99.8 fl (ref 78.0–100.0)
Monocytes Absolute: 0.5 10*3/uL (ref 0.1–1.0)
Monocytes Relative: 13.7 % — ABNORMAL HIGH (ref 3.0–12.0)
Neutro Abs: 2.1 10*3/uL (ref 1.4–7.7)
Neutrophils Relative %: 57 % (ref 43.0–77.0)
Platelets: 266 10*3/uL (ref 150.0–400.0)
RBC: 4.44 Mil/uL (ref 4.22–5.81)
RDW: 13 % (ref 11.5–15.5)
WBC: 3.7 10*3/uL — ABNORMAL LOW (ref 4.0–10.5)

## 2016-07-30 MED ORDER — DOXYCYCLINE HYCLATE 100 MG PO TABS: 100.0000 mg | ORAL_TABLET | Freq: Two times a day (BID) | ORAL | 0 refills | Status: DC

## 2016-07-30 MED ORDER — LOSARTAN POTASSIUM 100 MG PO TABS: 100.0000 mg | ORAL_TABLET | Freq: Every day | ORAL | 0 refills | Status: DC

## 2016-07-30 MED ORDER — PREDNISONE 20 MG PO TABS: ORAL_TABLET | ORAL | 0 refills | Status: DC

## 2016-07-30 NOTE — Progress Notes (Signed)
BP 130/72   Pulse 60   Temp 98 F (36.7 C) (Oral)   Wt 214 lb 12 oz (97.4 kg)   SpO2 98%   BMI 29.95 kg/m    CC: skin rash Subjective:    [name], male    DOB: 1955-12-26, 61 y.o.   MRN: [id number]  HPI: Louis [name] is a 61 y.o. male presenting on 07/30/2016 for Rash (denies lifestyle changes. Hx of eczema)   New rash that seems to be spreading over the last month - rough scaly oozing patches legs > arms that seem to be spreading. Itchy > tender.   H/o eczema in the past  No fevers/chills, nausea, oral lesions.  Recent trip to [place]. No other rashes at home.  No new lotions, detergents, soaps or shampoos.  No new bug bites, tick bites.  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   Worsening tremor.   Has valsartan 320mg . Affected by recall.   Relevant past medical, surgical, family and social history reviewed and updated as indicated. Interim medical history since our last visit reviewed. Allergies and medications reviewed and updated. Outpatient Medications Prior to Visit  Medication Sig Dispense Refill  . albuterol (PROVENTIL HFA;VENTOLIN HFA) 108 (90 BASE) MCG/ACT inhaler Inhale 2 puffs into the lungs every 4 (four) hours as needed for wheezing or shortness of breath.    . ALPRAZolam (XANAX) 0.5 MG tablet Take 1 tablet (0.5 mg total) by mouth at bedtime as needed for sleep. 30 tablet 0  . amLODipine (NORVASC) 5 MG tablet Take 1 tablet (5 mg total) by mouth daily. 90 tablet 3  . fluticasone (FLONASE) 50 MCG/ACT nasal spray Place 2 sprays into both nostrils daily.    . metoprolol succinate (TOPROL-XL) 50 MG 24 hr tablet Take 3 tablets (150 mg total) by mouth daily. 270 tablet 3  . valsartan (DIOVAN) 320 MG tablet Take 1 tablet (320 mg total) by mouth daily. 90 tablet 3  . Vitamin D, Ergocalciferol, (DRISDOL) 50000 UNITS CAPS capsule Take 50,000 Units by mouth every 7 (seven) days.     No facility-administered  medications prior to visit.      Per HPI unless specifically indicated in ROS section below Review of Systems     Objective:    BP 130/72   Pulse 60   Temp 98 F (36.7 C) (Oral)   Wt 214 lb 12 oz (97.4 kg)   SpO2 98%   BMI 29.95 kg/m   Wt Readings from Last 3 Encounters:  07/30/16 214 lb 12 oz (97.4 kg)  03/13/16 200 lb (90.7 kg)  10/14/15 210 lb 12 oz (95.6 kg)    Physical Exam  Constitutional: He appears well-developed and well-nourished. No distress.  Skin: Skin is warm and dry. Rash noted.  Overall well demarcated annular patches throughout skin that started at feet, oozing with some crusting Papular rash on trunk No herald patch  See pics   Nursing note and vitals reviewed.  Results for orders placed or performed in visit on 09/26/15  Protime-INR  Result Value Ref Range   INR 1.1 (H) 0.8 - 1.0 ratio   Prothrombin Time 11.0 9.6 - 13.1 sec  VITAMIN D 25 Hydroxy (Vit-D Deficiency, Fractures)  Result Value Ref Range   VITD 35.12 30.00 - 100.00 ng/mL  Comprehensive metabolic panel  Result Value Ref Range   Sodium 135 135 - 145 mEq/L   Potassium 4.4  3.5 - 5.1 mEq/L   Chloride 96 96 - 112 mEq/L   CO2 27 19 - 32 mEq/L   Glucose, Bld 88 70 - 99 mg/dL   BUN 17 6 - 23 mg/dL   Creatinine, Ser 0.80 0.40 - 1.50 mg/dL   Total Bilirubin 0.8 0.2 - 1.2 mg/dL   Alkaline Phosphatase 66 39 - 117 U/L   AST 36 0 - 37 U/L   ALT 36 0 - 53 U/L   Total Protein 8.0 6.0 - 8.3 g/dL   Albumin 4.5 3.5 - 5.2 g/dL   Calcium 9.7 8.4 - 10.5 mg/dL   GFR 105.44 >60.00 mL/min  CBC with Differential/Platelet  Result Value Ref Range   WBC 7.1 4.0 - 10.5 K/uL   RBC 4.55 4.22 - 5.81 Mil/uL   Hemoglobin 14.7 13.0 - 17.0 g/dL   HCT 43.2 39.0 - 52.0 %   MCV 94.9 78.0 - 100.0 fl   MCHC 34.0 30.0 - 36.0 g/dL   RDW 13.3 11.5 - 15.5 %   Platelets 264.0 150.0 - 400.0 K/uL   Neutrophils Relative % 66.9 43.0 - 77.0 %   Lymphocytes Relative 21.7 12.0 - 46.0 %   Monocytes Relative 7.4 3.0 - 12.0 %     Eosinophils Relative 3.3 0.0 - 5.0 %   Basophils Relative 0.7 0.0 - 3.0 %   Neutro Abs 4.8 1.4 - 7.7 K/uL   Lymphs Abs 1.5 0.7 - 4.0 K/uL   Monocytes Absolute 0.5 0.1 - 1.0 K/uL   Eosinophils Absolute 0.2 0.0 - 0.7 K/uL   Basophils Absolute 0.0 0.0 - 0.1 K/uL  Lipid panel  Result Value Ref Range   Cholesterol 214 (H) 0 - 200 mg/dL   Triglycerides 60.0 0.0 - 149.0 mg/dL   HDL 100.60 >39.00 mg/dL   VLDL 12.0 0.0 - 40.0 mg/dL   LDL Cholesterol 101 (H) 0 - 99 mg/dL   Total CHOL/HDL Ratio 2    NonHDL 113.11   HIV antibody  Result Value Ref Range   HIV 1&2 Ab, 4th Generation NONREACTIVE NONREACTIVE  Hepatitis C antibody  Result Value Ref Range   HCV Ab NEGATIVE NEGATIVE  APTT  Result Value Ref Range   aPTT 28.0 23.4 - 32.7 SEC      Assessment & Plan:   Problem List Items Addressed This Visit    Skin rash - Primary    In h/o eczema, ?nummular eczema superinfected with impetigo. Rx doxycycline, prednisone course. Check labs today (CBC, CMP). Update if not improving with treatment for derm referral. Pt agrees with plan.       Relevant Orders   CBC with Differential/Platelet   Comprehensive metabolic panel       Follow up plan: Return if symptoms worsen or fail to improve.  Ria Bush, MD

## 2016-07-30 NOTE — Telephone Encounter (Signed)
Received faxed refill request and refilled - plz phone in xanax.

## 2016-07-30 NOTE — Patient Instructions (Signed)
Possible infected eczema - treat with prednisone and doxycycline antibiotic. Labs today.  If no better, let us know for referral to skin doctor.

## 2016-07-30 NOTE — Assessment & Plan Note (Signed)
In h/o eczema, ?nummular eczema superinfected with impetigo. Rx doxycycline, prednisone course. Check labs today (CBC, CMP). Update if not improving with treatment for derm referral. Pt agrees with plan.

## 2016-07-31 MED ORDER — ALPRAZOLAM 0.5 MG PO TABS: 0.5000 mg | ORAL_TABLET | Freq: Every evening | ORAL | 0 refills | Status: DC | PRN

## 2016-07-31 MED ORDER — AMLODIPINE BESYLATE 5 MG PO TABS: 5.0000 mg | ORAL_TABLET | Freq: Every day | ORAL | 3 refills | Status: DC

## 2016-07-31 MED ORDER — METOPROLOL SUCCINATE ER 50 MG PO TB24: 150.0000 mg | ORAL_TABLET | Freq: Every day | ORAL | 3 refills | Status: DC

## 2016-07-31 NOTE — Telephone Encounter (Signed)
Rx called in to requested pharmacy 

## 2016-08-02 ENCOUNTER — Telehealth: Payer: Self-pay

## 2016-08-02 NOTE — Telephone Encounter (Signed)
Called patient to verify if 90 day Rx refill request for Alprazolam .5mg  tab from mail order pharmacy is what he requested. No answer.

## 2016-08-06 NOTE — Telephone Encounter (Signed)
Attempted to call patient. No answer left voicemail to call back

## 2016-08-07 ENCOUNTER — Encounter: Payer: Self-pay | Admitting: Family Medicine

## 2016-08-07 DIAGNOSIS — R21 Rash and other nonspecific skin eruption: Secondary | ICD-10-CM

## 2016-08-07 NOTE — Telephone Encounter (Signed)
Attempted to call patient. No answer. Left vm on machine to call back.

## 2016-08-07 NOTE — Telephone Encounter (Signed)
Derm referral placed

## 2016-08-09 MED ORDER — PREDNISONE 20 MG PO TABS: ORAL_TABLET | ORAL | 0 refills | Status: DC

## 2016-08-09 MED ORDER — BETAMETHASONE DIPROPIONATE AUG 0.05 % EX OINT: TOPICAL_OINTMENT | Freq: Two times a day (BID) | CUTANEOUS | 0 refills | Status: DC

## 2016-08-09 MED ORDER — TRIAMCINOLONE ACETONIDE 0.1 % EX CREA: 1.0000 "application " | TOPICAL_CREAM | Freq: Two times a day (BID) | CUTANEOUS | 0 refills | Status: DC

## 2016-08-09 NOTE — Addendum Note (Signed)
Addended by: Ria Bush on: 08/09/2016 12:46 AM   Modules accepted: Orders

## 2016-08-09 NOTE — Addendum Note (Signed)
Addended by: Ria Bush on: 08/09/2016 12:44 PM   Modules accepted: Orders

## 2016-08-29 ENCOUNTER — Encounter: Payer: Self-pay | Admitting: Family Medicine

## 2016-10-05 ENCOUNTER — Telehealth: Payer: Self-pay

## 2016-10-05 NOTE — Telephone Encounter (Signed)
Pt left v/m requesting cb about scheduling tetanus shot,flu shot and shingrix. Left v/m requesting cb.

## 2016-10-08 NOTE — Telephone Encounter (Signed)
Pt called back and I advised we do not have the shingrix vaccine yet but was going to schedule TD and flu shot for pt but pt already has CPX scheduled on 10/19/16 and pt said would get his immunizations at that time. Nothing further needed.

## 2016-10-17 ENCOUNTER — Other Ambulatory Visit: Payer: Self-pay

## 2016-10-17 MED ORDER — LOSARTAN POTASSIUM 100 MG PO TABS: 100.0000 mg | ORAL_TABLET | Freq: Every day | ORAL | 1 refills | Status: DC

## 2016-10-17 NOTE — Telephone Encounter (Signed)
Pt request refill losartan to [facility]; pt will cb and schedule CPX first of yr; work schedule will not allow pt to come in for appt now. Last CPX 10/2016.

## 2016-10-19 ENCOUNTER — Encounter: Payer: BLUE CROSS/BLUE SHIELD | Admitting: Family Medicine

## 2016-10-23 ENCOUNTER — Ambulatory Visit: Payer: PRIVATE HEALTH INSURANCE

## 2016-12-11 ENCOUNTER — Ambulatory Visit: Payer: PRIVATE HEALTH INSURANCE | Admitting: Family Medicine

## 2016-12-18 ENCOUNTER — Ambulatory Visit: Payer: PRIVATE HEALTH INSURANCE | Admitting: Family Medicine

## 2016-12-18 ENCOUNTER — Encounter: Payer: Self-pay | Admitting: Family Medicine

## 2016-12-18 VITALS — BP 122/76 | HR 65 | Temp 98.0°F | Wt 232.0 lb

## 2016-12-18 DIAGNOSIS — M2141 Flat foot [pes planus] (acquired), right foot: Secondary | ICD-10-CM

## 2016-12-18 DIAGNOSIS — I10 Essential (primary) hypertension: Secondary | ICD-10-CM

## 2016-12-18 DIAGNOSIS — E669 Obesity, unspecified: Secondary | ICD-10-CM | POA: Diagnosis not present

## 2016-12-18 DIAGNOSIS — L309 Dermatitis, unspecified: Secondary | ICD-10-CM

## 2016-12-18 DIAGNOSIS — R740 Nonspecific elevation of levels of transaminase and lactic acid dehydrogenase [LDH]: Secondary | ICD-10-CM

## 2016-12-18 DIAGNOSIS — M2142 Flat foot [pes planus] (acquired), left foot: Secondary | ICD-10-CM

## 2016-12-18 DIAGNOSIS — R600 Localized edema: Secondary | ICD-10-CM | POA: Insufficient documentation

## 2016-12-18 DIAGNOSIS — R7401 Elevation of levels of liver transaminase levels: Secondary | ICD-10-CM | POA: Insufficient documentation

## 2016-12-18 MED ORDER — ZOSTER VAC RECOMB ADJUVANTED 50 MCG/0.5ML IM SUSR: 0.5000 mL | Freq: Once | INTRAMUSCULAR | 1 refills | Status: AC

## 2016-12-18 NOTE — Assessment & Plan Note (Signed)
Noted marked pes planus - he uses arch support insoles

## 2016-12-18 NOTE — Patient Instructions (Addendum)
Shingrix vaccine printed out today.  I think leg swelling is due to several different things, amlodipine may be contributing.  Work on decreased salt/sodium in the diet, stay well hydrated with water, elevate legs, if worsening could consider compression stockings.  You could cut amlodipine in half and see if this helps, monitoring blood pressures.  Let us know if worsening swelling, or new symptoms of pain, redness, shortness of breath or other concerns.

## 2016-12-18 NOTE — Assessment & Plan Note (Signed)
Appreciate derm care.  

## 2016-12-18 NOTE — Assessment & Plan Note (Signed)
Noted weight gain reviewed with patient. Encouraged healthy diet and lifestyle changes for goal sustainable weight loss

## 2016-12-18 NOTE — Assessment & Plan Note (Signed)
Noted last labwork. Anticipate fatty liver related. Will need rechecked at next labs.

## 2016-12-18 NOTE — Progress Notes (Addendum)
BP 122/76 (BP Location: Left Arm, Patient Position: Sitting, Cuff Size: Normal)   Pulse 65   Temp 98 F (36.7 C) (Oral)   Wt 232 lb (105.2 kg)   SpO2 94%   BMI 32.36 kg/m    CC: ankle swelling Subjective:    [name], male    DOB: 06/07/1955, 61 y.o.   MRN: [id number]  HPI: Louis [name] is a 61 y.o. male presenting on 12/18/2016 for Joint Swelling (bilateral ankles, worse in right. Worse in evenings. Denies any pain); Prescription request (Request rx for shingles shot); and New dx (Dx with eczema and psoriasis by dermatology)   See prior note for details - ?infected eczema treated with prednisone and doxycycline. Saw derm, dx eczema + psoriasis after biopsy. Treated for both with topical ointments.   Over last >1 yr noticing progressive swelling around ankles worse in evenings. Dependent pedal edema to mid calf. No dyspnea, cough, chest pain, abd pain. No leg pain.   Stays well hydrated. Tries to avoid salt in the diet. Elevates legs  Chronic pes planus - he does use arch support.   Relevant past medical, surgical, family and social history reviewed and updated as indicated. Interim medical history since our last visit reviewed. Allergies and medications reviewed and updated. Outpatient Medications Prior to Visit  Medication Sig Dispense Refill  . ALPRAZolam (XANAX) 0.5 MG tablet Take 1 tablet (0.5 mg total) by mouth at bedtime as needed for sleep. 30 tablet 0  . amLODipine (NORVASC) 5 MG tablet Take 0.5-1 tablets (2.5-5 mg total) by mouth daily.    [name] losartan (COZAAR) 100 MG tablet Take 1 tablet (100 mg total) by mouth daily. 90 tablet 1  . metoprolol succinate (TOPROL-XL) 50 MG 24 hr tablet Take 3 tablets (150 mg total) by mouth daily. 270 tablet 3  . Vitamin D, Ergocalciferol, (DRISDOL) 50000 UNITS CAPS capsule Take 50,000 Units by mouth every 7 (seven) days.    [name] amLODipine (NORVASC) 5 MG tablet Take 1 tablet (5 mg total) by mouth daily. 90 tablet 3  .  albuterol (PROVENTIL HFA;VENTOLIN HFA) 108 (90 BASE) MCG/ACT inhaler Inhale 2 puffs into the lungs every 4 (four) hours as needed for wheezing or shortness of breath.    . fluticasone (FLONASE) 50 MCG/ACT nasal spray Place 2 sprays into both nostrils daily.    [name] augmented betamethasone dipropionate (DIPROLENE-AF) 0.05 % ointment Apply topically 2 (two) times daily. 45 g 0  . doxycycline (VIBRA-TABS) 100 MG tablet Take 1 tablet (100 mg total) by mouth 2 (two) times daily. 14 tablet 0  . predniSONE (DELTASONE) 20 MG tablet Take two tablets daily for 4 days followed by one tablet daily for 4 days 12 tablet 0  . valsartan (DIOVAN) 320 MG tablet Take 1 tablet (320 mg total) by mouth daily. 90 tablet 3   No facility-administered medications prior to visit.      Per HPI unless specifically indicated in ROS section below Review of Systems     Objective:    BP 122/76 (BP Location: Left Arm, Patient Position: Sitting, Cuff Size: Normal)   Pulse 65   Temp 98 F (36.7 C) (Oral)   Wt 232 lb (105.2 kg)   SpO2 94%   BMI 32.36 kg/m   Wt Readings from Last 3 Encounters:  12/18/16 232 lb (105.2 kg)  07/30/16 214 lb 12 oz (97.4 kg)  03/13/16 200 lb (90.7 kg)    Physical Exam  Constitutional: He appears  well-developed and well-nourished. No distress.  HENT:  Mouth/Throat: Oropharynx is clear and moist. No oropharyngeal exudate.  Neck: Normal range of motion. Neck supple. No thyromegaly present.  Cardiovascular: Normal rate, regular rhythm, normal heart sounds and intact distal pulses.  No murmur heard. Pulmonary/Chest: Effort normal and breath sounds normal. No respiratory distress. He has no wheezes. He has no rales.  Abdominal: Soft. Bowel sounds are normal. He exhibits no distension and no mass. There is no hepatosplenomegaly. There is no tenderness. There is no rebound and no guarding.  Musculoskeletal: He exhibits edema.  1+ DP/PT bilaterally 1+ pitting edema at bilateral ankles extending  proximally Loss of longitudinal arch bilaterally with marked pes planus and overpronation  Skin: Skin is warm and dry. No erythema.  Psychiatric: He has a normal mood and affect.  Nursing note and vitals reviewed.  Results for orders placed or performed in visit on 07/30/16  CBC with Differential/Platelet  Result Value Ref Range   WBC 3.7 (L) 4.0 - 10.5 K/uL   RBC 4.44 4.22 - 5.81 Mil/uL   Hemoglobin 14.9 13.0 - 17.0 g/dL   HCT 44.3 39.0 - 52.0 %   MCV 99.8 78.0 - 100.0 fl   MCHC 33.8 30.0 - 36.0 g/dL   RDW 13.0 11.5 - 15.5 %   Platelets 266.0 150.0 - 400.0 K/uL   Neutrophils Relative % 57.0 43.0 - 77.0 %   Lymphocytes Relative 24.0 12.0 - 46.0 %   Monocytes Relative 13.7 (H) 3.0 - 12.0 %   Eosinophils Relative 4.0 0.0 - 5.0 %   Basophils Relative 1.3 0.0 - 3.0 %   Neutro Abs 2.1 1.4 - 7.7 K/uL   Lymphs Abs 0.9 0.7 - 4.0 K/uL   Monocytes Absolute 0.5 0.1 - 1.0 K/uL   Eosinophils Absolute 0.1 0.0 - 0.7 K/uL   Basophils Absolute 0.1 0.0 - 0.1 K/uL  Comprehensive metabolic panel  Result Value Ref Range   Sodium 134 (L) 135 - 145 mEq/L   Potassium 5.2 (H) 3.5 - 5.1 mEq/L   Chloride 97 96 - 112 mEq/L   CO2 27 19 - 32 mEq/L   Glucose, Bld 98 70 - 99 mg/dL   BUN 19 6 - 23 mg/dL   Creatinine, Ser 0.87 0.40 - 1.50 mg/dL   Total Bilirubin 0.9 0.2 - 1.2 mg/dL   Alkaline Phosphatase 70 39 - 117 U/L   AST 61 (H) 0 - 37 U/L   ALT 59 (H) 0 - 53 U/L   Total Protein 7.6 6.0 - 8.3 g/dL   Albumin 4.6 3.5 - 5.2 g/dL   Calcium 9.6 8.4 - 10.5 mg/dL   GFR 95.43 >60.00 mL/min      Assessment & Plan:   Problem List Items Addressed This Visit    Bilateral pes planus    Noted marked pes planus - he uses arch support insoles      HTN (hypertension)    Chronic, stable. He is interested in lower amlodipine dose (will cut in half) to see if any improvement in pedal edema. He will monitor blood pressure on lower amlodipine.       Relevant Medications   amLODipine (NORVASC) 5 MG tablet    Obesity, Class I, BMI 30-34.9    Noted weight gain reviewed with patient. Encouraged healthy diet and lifestyle changes for goal sustainable weight loss      Pedal edema - Primary    Mild, anticipate multifactorial (currently sedentary job/lifestyle, noted weight gain, possible mild CVI, amlodipine  side effect). Doubt due to CHF, liver disease or renal disease. Supportive care reviewed as per instructions. Pt agrees with plan. He will work on decreasing his sodium intake. If persistent or worsening, would further eval with labs, urinalysis, consider compression stockings.      Psoriasis-eczema overlap condition    Appreciate derm care.      Transaminitis    Noted last labwork. Anticipate fatty liver related. Will need rechecked at next labs.           Follow up plan: Return if symptoms worsen or fail to improve.  Ria Bush, MD

## 2016-12-18 NOTE — Assessment & Plan Note (Addendum)
Mild, anticipate multifactorial (currently sedentary job/lifestyle, noted weight gain, possible mild CVI, amlodipine side effect). Doubt due to CHF, liver disease or renal disease. Supportive care reviewed as per instructions. Pt agrees with plan. He will work on decreasing his sodium intake. If persistent or worsening, would further eval with labs, urinalysis, consider compression stockings.

## 2016-12-18 NOTE — Assessment & Plan Note (Signed)
Chronic, stable. He is interested in lower amlodipine dose (will cut in half) to see if any improvement in pedal edema. He will monitor blood pressure on lower amlodipine.

## 2017-01-11 ENCOUNTER — Other Ambulatory Visit: Payer: Self-pay | Admitting: Family Medicine

## 2017-01-11 NOTE — Telephone Encounter (Signed)
Last phoned in: 07/31/16, #30 Last OV:  12/18/16 Next OV:  none

## 2017-01-13 MED ORDER — ALPRAZOLAM 0.5 MG PO TABS: 0.5000 mg | ORAL_TABLET | Freq: Every evening | ORAL | 0 refills | Status: DC | PRN

## 2017-01-13 NOTE — Telephone Encounter (Signed)
Sent electronically 

## 2017-01-23 ENCOUNTER — Other Ambulatory Visit: Payer: Self-pay

## 2017-02-14 ENCOUNTER — Ambulatory Visit: Payer: Self-pay | Admitting: Urology

## 2017-02-15 ENCOUNTER — Other Ambulatory Visit: Payer: Self-pay

## 2017-02-15 DIAGNOSIS — R972 Elevated prostate specific antigen [PSA]: Secondary | ICD-10-CM

## 2017-02-17 ENCOUNTER — Encounter: Payer: Self-pay | Admitting: Family Medicine

## 2017-02-17 DIAGNOSIS — H168 Other keratitis: Secondary | ICD-10-CM | POA: Insufficient documentation

## 2017-02-19 ENCOUNTER — Ambulatory Visit: Payer: Self-pay | Admitting: Urology

## 2017-02-19 ENCOUNTER — Other Ambulatory Visit: Payer: Self-pay

## 2017-02-22 ENCOUNTER — Other Ambulatory Visit: Payer: PRIVATE HEALTH INSURANCE

## 2017-02-22 DIAGNOSIS — R972 Elevated prostate specific antigen [PSA]: Secondary | ICD-10-CM

## 2017-02-23 LAB — PSA: Prostate Specific Ag, Serum: 5.9 ng/mL — ABNORMAL HIGH (ref 0.0–4.0)

## 2017-02-27 ENCOUNTER — Ambulatory Visit (INDEPENDENT_AMBULATORY_CARE_PROVIDER_SITE_OTHER): Payer: PRIVATE HEALTH INSURANCE | Admitting: Urology

## 2017-02-27 ENCOUNTER — Encounter: Payer: Self-pay | Admitting: Urology

## 2017-02-27 VITALS — BP 154/84 | HR 65 | Ht 71.0 in | Wt 234.2 lb

## 2017-02-27 DIAGNOSIS — R972 Elevated prostate specific antigen [PSA]: Secondary | ICD-10-CM | POA: Diagnosis not present

## 2017-02-28 ENCOUNTER — Encounter: Payer: Self-pay | Admitting: Urology

## 2017-02-28 LAB — URINALYSIS, COMPLETE
Bilirubin, UA: NEGATIVE
Glucose, UA: NEGATIVE
Ketones, UA: NEGATIVE
Leukocytes, UA: NEGATIVE
Nitrite, UA: NEGATIVE
Protein, UA: NEGATIVE
RBC, UA: NEGATIVE
Specific Gravity, UA: 1.015 (ref 1.005–1.030)
Urobilinogen, Ur: 1 mg/dL (ref 0.2–1.0)
pH, UA: 6 (ref 5.0–7.5)

## 2017-02-28 NOTE — Progress Notes (Signed)
02/27/2017 10:09 AM   Louis [name] [birth year] [id number]  Referring provider: Ria Bush, MD [address] [place], [place] [address]  Chief Complaint  Patient presents with  . Elevated PSA    2018 PSA with [facility] pt does not have those records     HPI: Louis [name] is a 62 year old male with a history of an elevated PSA.  He has been followed by [facility] in [place] for several years however due to insurance network changes is transferring care here.  Prostate biopsy was performed in October 2013 for a PSA of 4.4.  Prostate volume was 32 cc.  And all biopsies were benign.  His PSA bumped to the upper 6 range in 2014.  A PCA 3 was performed which was felt indicative with low risk prostate cancer and repeat biopsy was not performed. His last PSA December 2017 was 4.82.  His most recent PSA are as follows:  10/2011     4.4 04/2012       6.59 06/2012       6.85 10/2012     6.85 10/2013     3.19 12/2014     4.95 12/2015     4.82  A PSA performed here on 02/22/2017 was 5.9.  He denies bothersome lower urinary tract symptoms and I PSS completed today was 0/35.  He denies dysuria or gross hematuria.  There is no flank, abdominal, pelvic or scrotal pain.  PMH: Past Medical History:  Diagnosis Date  . BPH (benign prostatic hyperplasia)    on flomax  . Bundle branch block    per prior PCP records  . Diverticulosis    by colonoscopy  . Eczema    per prior PCP records  . Elevated PSA 2015   peaked 6s, s/p benign biopsy 2014, sees urology Louis [name] ([place])  . Essential tremor   . GERD (gastroesophageal reflux disease)    per prior PCP records  . History of panic attacks    per prior PCP records  . HTN (hypertension)   . Mild intermittent asthma in adult without complication   . Obesity, Class I, BMI 30-34.9   . Osteoporosis    DEXA 06/2013 with osteopenia - h/o ?wrist/hip fracture from AVN from chronic steroid use (asthma), took  reclast for 1 year  . Renal artery stenosis in 1 of 2 vessels ([place]) 2011   by [place]  . Seasonal allergies   . Thoracic scoliosis childhood  . Transaminitis    per prior PCP records    Surgical History: Past Surgical History:  Procedure Laterality Date  . COLONOSCOPY  12/2013   polyps, int hem, diverticulosis, rpt 5 yrs ([place])  . ELBOW SURGERY Right 2008   golfer's elbow  . INGUINAL HERNIA REPAIR Bilateral childhood & ~1995  . NASAL SEPTUM SURGERY  2013   deviated septum  . US ECHOCARDIOGRAPHY  02/2009   WNL, EF >55% [facility])  . US RENAL/AORTA Right 05/2009   1-59% diameter reduction renal artery, rec rpt 2 yrs    Home Medications:  Allergies as of 02/27/2017      Reactions   Accupril [quinapril Hcl] Cough      Medication List        Accurate as of 02/27/17 11:59 PM. Always use your most recent med list.          albuterol 108 (90 Base) MCG/ACT inhaler Commonly known as:  PROVENTIL HFA;VENTOLIN HFA Inhale 2 puffs into the lungs every 4 (  four) hours as needed for wheezing or shortness of breath.   ALPRAZolam 0.5 MG tablet Commonly known as:  XANAX Take 1 tablet (0.5 mg total) by mouth at bedtime as needed for sleep.   amLODipine 5 MG tablet Commonly known as:  NORVASC Take 0.5-1 tablets (2.5-5 mg total) by mouth daily.   fluticasone 50 MCG/ACT nasal spray Commonly known as:  FLONASE Place 2 sprays into both nostrils daily.   losartan 100 MG tablet Commonly known as:  COZAAR Take 1 tablet (100 mg total) by mouth daily.   LOTEMAX 0.5 % Gel Generic drug:  Loteprednol Etabonate   metoprolol succinate 50 MG 24 hr tablet Commonly known as:  TOPROL-XL Take 3 tablets (150 mg total) by mouth daily.   SHINGRIX injection Generic drug:  Zoster Vaccine Adjuvanted inject 0.5 milliliter intramuscularly   urea 40 % Crea Commonly known as:  CARMOL apply to affected area twice a day for THICKEST AREAS if needed   Vitamin D (Ergocalciferol) 50000 units Caps  capsule Commonly known as:  DRISDOL Take 50,000 Units by mouth every 7 (seven) days.       Allergies:  Allergies  Allergen Reactions  . Accupril [Quinapril Hcl] Cough    Family History: Family History  Problem Relation Age of Onset  . Cancer Father 68       colon  . Prostate cancer Father   . Diabetes Mother   . Alcohol abuse Mother   . Stroke Mother   . Cancer Maternal Grandmother        pancreatic  . Ataxia Brother   . Ataxia Sister   . CAD Neg Hx     Social History:  reports that  has never smoked. he has never used smokeless tobacco. He reports that he drinks alcohol. He reports that he does not use drugs.  ROS: UROLOGY Frequent Urination?: No Hard to postpone urination?: No Burning/pain with urination?: No Get up at night to urinate?: Yes Leakage of urine?: No Urine stream starts and stops?: No Trouble starting stream?: No Do you have to strain to urinate?: No Blood in urine?: No Urinary tract infection?: No Sexually transmitted disease?: No Injury to kidneys or bladder?: No Painful intercourse?: No Weak stream?: No Erection problems?: No Penile pain?: No  Gastrointestinal Nausea?: No Vomiting?: No Indigestion/heartburn?: No Diarrhea?: No Constipation?: No  Constitutional Fever: No Night sweats?: No Weight loss?: No Fatigue?: No  Skin Skin rash/lesions?: No Itching?: No  Eyes Blurred vision?: No Double vision?: No  Ears/Nose/Throat Sore throat?: No Sinus problems?: No  Hematologic/Lymphatic Swollen glands?: No Easy bruising?: No  Cardiovascular Leg swelling?: No Chest pain?: No  Respiratory Cough?: No Shortness of breath?: No  Endocrine Excessive thirst?: No  Musculoskeletal Back pain?: No Joint pain?: No  Neurological Headaches?: No Dizziness?: No  Psychologic Depression?: No Anxiety?: No  Physical Exam: BP (!) 154/84 (BP Location: Right Arm, Patient Position: Sitting, Cuff Size: Large)   Pulse 65   Ht 5'  11" (1.803 m)   Wt 234 lb 3.2 oz (106.2 kg)   SpO2 92%   BMI 32.66 kg/m   Constitutional:  Alert and oriented, No acute distress. HEENT: [place] AT, moist mucus membranes.  Trachea midline, no masses. Cardiovascular: No clubbing, cyanosis, or edema. Respiratory: Normal respiratory effort, no increased work of breathing. GI: Abdomen is soft, nontender, nondistended, no abdominal masses GU: No CVA tenderness.  Prostate 35 g, smooth without nodules Skin: No rashes, bruises or suspicious lesions. Lymph: No cervical or inguinal adenopathy.  Neurologic: Grossly intact, no focal deficits, moving all 4 extremities. Psychiatric: Normal mood and affect.  Laboratory Data: Lab Results  Component Value Date   WBC 3.7 (L) 07/30/2016   HGB 14.9 07/30/2016   HCT 44.3 07/30/2016   MCV 99.8 07/30/2016   PLT 266.0 07/30/2016    Lab Results  Component Value Date   CREATININE 0.87 07/30/2016    Lab Results  Component Value Date   PSA1 5.9 (H) 02/22/2017    Urinalysis Lab Results  Component Value Date   SPECGRAV 1.015 02/27/2017   PHUR 6.0 02/27/2017   COLORU Yellow 02/27/2017   APPEARANCEUR Clear 02/27/2017   LEUKOCYTESUR Negative 02/27/2017   PROTEINUR Negative 02/27/2017   GLUCOSEU Negative 02/27/2017   KETONESU Negative 02/27/2017   RBCU Negative 02/27/2017   BILIRUBINUR Negative 02/27/2017   UUROB 1.0 02/27/2017   NITRITE Negative 02/27/2017    Assessment & Plan: 62 year old male with a mildly elevated PSA and previous benign prostate biopsy.  His most recent PSA is elevated above his baseline however has been as high as 6.8 and decreased.  I discussed options of obtaining a prostate MRI versus continued surveillance.  Based on his PSA trend it is reasonable to repeat a PSA in approximately 4 months and if persistently elevated above baseline proceed with MRI at that time.  He has elected that option and will have his PSA repeated around June 2019.   1. Elevated PSA  -  Urinalysis, Complete - PSA    Abbie Sons, MD  [facility] [address] [place], [place] [address] [phone]

## 2017-04-17 ENCOUNTER — Other Ambulatory Visit: Payer: Self-pay | Admitting: Family Medicine

## 2017-06-27 ENCOUNTER — Other Ambulatory Visit: Payer: PRIVATE HEALTH INSURANCE

## 2017-06-27 ENCOUNTER — Encounter: Payer: Self-pay | Admitting: Urology

## 2017-07-02 ENCOUNTER — Ambulatory Visit (INDEPENDENT_AMBULATORY_CARE_PROVIDER_SITE_OTHER): Payer: 59

## 2017-07-02 ENCOUNTER — Encounter: Payer: Self-pay | Admitting: Family Medicine

## 2017-07-02 ENCOUNTER — Inpatient Hospital Stay (HOSPITAL_COMMUNITY)
Admission: EM | Admit: 2017-07-02 | Discharge: 2017-07-04 | DRG: 189 | Disposition: A | Discharge status: Home Health | Payer: 59 | Attending: Internal Medicine | Admitting: Internal Medicine

## 2017-07-02 ENCOUNTER — Emergency Department (HOSPITAL_COMMUNITY): Payer: 59

## 2017-07-02 ENCOUNTER — Encounter (HOSPITAL_COMMUNITY): Payer: Self-pay | Admitting: Emergency Medicine

## 2017-07-02 ENCOUNTER — Ambulatory Visit: Payer: PRIVATE HEALTH INSURANCE | Admitting: Family Medicine

## 2017-07-02 ENCOUNTER — Other Ambulatory Visit: Payer: Self-pay

## 2017-07-02 ENCOUNTER — Ambulatory Visit: Payer: Self-pay | Admitting: *Deleted

## 2017-07-02 VITALS — BP 158/70 | HR 85 | Ht 71.0 in | Wt 239.0 lb

## 2017-07-02 DIAGNOSIS — J9602 Acute respiratory failure with hypercapnia: Secondary | ICD-10-CM | POA: Diagnosis present

## 2017-07-02 DIAGNOSIS — J9601 Acute respiratory failure with hypoxia: Principal | ICD-10-CM | POA: Diagnosis present

## 2017-07-02 DIAGNOSIS — J9612 Chronic respiratory failure with hypercapnia: Secondary | ICD-10-CM

## 2017-07-02 DIAGNOSIS — I5031 Acute diastolic (congestive) heart failure: Secondary | ICD-10-CM | POA: Diagnosis present

## 2017-07-02 DIAGNOSIS — I10 Essential (primary) hypertension: Secondary | ICD-10-CM | POA: Diagnosis present

## 2017-07-02 DIAGNOSIS — J4520 Mild intermittent asthma, uncomplicated: Secondary | ICD-10-CM | POA: Diagnosis present

## 2017-07-02 DIAGNOSIS — R0602 Shortness of breath: Secondary | ICD-10-CM

## 2017-07-02 DIAGNOSIS — E669 Obesity, unspecified: Secondary | ICD-10-CM

## 2017-07-02 DIAGNOSIS — R0902 Hypoxemia: Secondary | ICD-10-CM | POA: Diagnosis present

## 2017-07-02 DIAGNOSIS — M810 Age-related osteoporosis without current pathological fracture: Secondary | ICD-10-CM | POA: Diagnosis present

## 2017-07-02 DIAGNOSIS — N400 Enlarged prostate without lower urinary tract symptoms: Secondary | ICD-10-CM | POA: Diagnosis present

## 2017-07-02 DIAGNOSIS — I2721 Secondary pulmonary arterial hypertension: Secondary | ICD-10-CM | POA: Diagnosis present

## 2017-07-02 DIAGNOSIS — J9611 Chronic respiratory failure with hypoxia: Secondary | ICD-10-CM | POA: Diagnosis present

## 2017-07-02 DIAGNOSIS — I5030 Unspecified diastolic (congestive) heart failure: Secondary | ICD-10-CM | POA: Diagnosis not present

## 2017-07-02 DIAGNOSIS — J189 Pneumonia, unspecified organism: Secondary | ICD-10-CM | POA: Diagnosis present

## 2017-07-02 DIAGNOSIS — K219 Gastro-esophageal reflux disease without esophagitis: Secondary | ICD-10-CM | POA: Diagnosis present

## 2017-07-02 DIAGNOSIS — I517 Cardiomegaly: Secondary | ICD-10-CM | POA: Diagnosis present

## 2017-07-02 DIAGNOSIS — Z79899 Other long term (current) drug therapy: Secondary | ICD-10-CM | POA: Diagnosis not present

## 2017-07-02 DIAGNOSIS — Z7951 Long term (current) use of inhaled steroids: Secondary | ICD-10-CM | POA: Diagnosis not present

## 2017-07-02 DIAGNOSIS — G4733 Obstructive sleep apnea (adult) (pediatric): Secondary | ICD-10-CM | POA: Diagnosis present

## 2017-07-02 DIAGNOSIS — E66811 Obesity, class 1: Secondary | ICD-10-CM | POA: Diagnosis present

## 2017-07-02 DIAGNOSIS — Z6832 Body mass index (BMI) 32.0-32.9, adult: Secondary | ICD-10-CM

## 2017-07-02 DIAGNOSIS — I110 Hypertensive heart disease with heart failure: Secondary | ICD-10-CM | POA: Diagnosis present

## 2017-07-02 DIAGNOSIS — Z888 Allergy status to other drugs, medicaments and biological substances status: Secondary | ICD-10-CM | POA: Diagnosis not present

## 2017-07-02 DIAGNOSIS — M4184 Other forms of scoliosis, thoracic region: Secondary | ICD-10-CM | POA: Diagnosis present

## 2017-07-02 DIAGNOSIS — I454 Nonspecific intraventricular block: Secondary | ICD-10-CM | POA: Diagnosis present

## 2017-07-02 DIAGNOSIS — G250 Essential tremor: Secondary | ICD-10-CM | POA: Diagnosis present

## 2017-07-02 DIAGNOSIS — J181 Lobar pneumonia, unspecified organism: Secondary | ICD-10-CM

## 2017-07-02 LAB — BLOOD GAS, ARTERIAL
Acid-Base Excess: 5.6 mmol/L — ABNORMAL HIGH (ref 0.0–2.0)
Bicarbonate: 35 mmol/L — ABNORMAL HIGH (ref 20.0–28.0)
Drawn by: 422461
O2 Content: 4 L/min
O2 Saturation: 90.9 %
Patient temperature: 98.4
pCO2 arterial: 72.4 mmHg (ref 32.0–48.0)
pH, Arterial: 7.305 — ABNORMAL LOW (ref 7.350–7.450)
pO2, Arterial: 70.9 mmHg — ABNORMAL LOW (ref 83.0–108.0)

## 2017-07-02 LAB — CBC WITH DIFFERENTIAL/PLATELET
Basophils Absolute: 0 10*3/uL (ref 0.0–0.1)
Basophils Relative: 1 %
Eosinophils Absolute: 0.1 10*3/uL (ref 0.0–0.7)
Eosinophils Relative: 1 %
HCT: 54.8 % — ABNORMAL HIGH (ref 39.0–52.0)
Hemoglobin: 17.3 g/dL — ABNORMAL HIGH (ref 13.0–17.0)
Lymphocytes Relative: 19 %
Lymphs Abs: 1.2 10*3/uL (ref 0.7–4.0)
MCH: 33.1 pg (ref 26.0–34.0)
MCHC: 31.6 g/dL (ref 30.0–36.0)
MCV: 105 fL — ABNORMAL HIGH (ref 78.0–100.0)
Monocytes Absolute: 1 10*3/uL (ref 0.1–1.0)
Monocytes Relative: 15 %
Neutro Abs: 4.2 10*3/uL (ref 1.7–7.7)
Neutrophils Relative %: 64 %
Platelets: 222 10*3/uL (ref 150–400)
RBC: 5.22 MIL/uL (ref 4.22–5.81)
RDW: 14.8 % (ref 11.5–15.5)
WBC: 6.5 10*3/uL (ref 4.0–10.5)

## 2017-07-02 LAB — COMPREHENSIVE METABOLIC PANEL
ALT: 38 U/L (ref 0–44)
AST: 22 U/L (ref 15–41)
Albumin: 3.9 g/dL (ref 3.5–5.0)
Alkaline Phosphatase: 82 U/L (ref 38–126)
Anion gap: 8 (ref 5–15)
BUN: 22 mg/dL — ABNORMAL HIGH (ref 6–20)
CO2: 33 mmol/L — ABNORMAL HIGH (ref 22–32)
Calcium: 9.5 mg/dL (ref 8.9–10.3)
Chloride: 96 mmol/L — ABNORMAL LOW (ref 98–111)
Creatinine, Ser: 1.02 mg/dL (ref 0.61–1.24)
GFR calc Af Amer: >60 mL/min (ref >60)
GFR calc non Af Amer: >60 mL/min (ref >60)
Glucose, Bld: 112 mg/dL — ABNORMAL HIGH (ref 70–99)
Potassium: 4.8 mmol/L (ref 3.5–5.1)
Sodium: 137 mmol/L (ref 135–145)
Total Bilirubin: 0.7 mg/dL (ref 0.3–1.2)
Total Protein: 7.5 g/dL (ref 6.5–8.1)

## 2017-07-02 LAB — TROPONIN I
Troponin I: <0.03 ng/mL (ref <0.03)
Troponin I: <0.03 ng/mL (ref <0.03)

## 2017-07-02 LAB — BRAIN NATRIURETIC PEPTIDE: B Natriuretic Peptide: 232.6 pg/mL — ABNORMAL HIGH (ref 0.0–100.0)

## 2017-07-02 LAB — TSH: TSH: 0.94 u[IU]/mL (ref 0.350–4.500)

## 2017-07-02 LAB — I-STAT CG4 LACTIC ACID, ED: Lactic Acid, Venous: 0.86 mmol/L (ref 0.5–1.9)

## 2017-07-02 MED ORDER — ALBUTEROL SULFATE (2.5 MG/3ML) 0.083% IN NEBU
5.0000 mg | INHALATION_SOLUTION | Freq: Once | RESPIRATORY_TRACT | Status: AC
  Administered 2017-07-02: 5 mg via RESPIRATORY_TRACT
  Filled 2017-07-02: qty 6

## 2017-07-02 MED ORDER — IOPAMIDOL (ISOVUE-370) INJECTION 76%
100.0000 mL | Freq: Once | INTRAVENOUS | Status: AC | PRN
  Administered 2017-07-02: 100 mL via INTRAVENOUS

## 2017-07-02 MED ORDER — IPRATROPIUM-ALBUTEROL 0.5-2.5 (3) MG/3ML IN SOLN
3.0000 mL | Freq: Three times a day (TID) | RESPIRATORY_TRACT | Status: DC
  Administered 2017-07-02 – 2017-07-04 (×5): 3 mL via RESPIRATORY_TRACT
  Filled 2017-07-02 (×5): qty 3

## 2017-07-02 MED ORDER — FLUOROMETHOLONE 0.1 % OP SUSP
1.0000 [drp] | Freq: Four times a day (QID) | OPHTHALMIC | Status: DC
  Administered 2017-07-02 – 2017-07-04 (×6): 1 [drp] via OPHTHALMIC
  Filled 2017-07-02: qty 5

## 2017-07-02 MED ORDER — ACETAMINOPHEN 325 MG PO TABS: 650.0000 mg | ORAL_TABLET | Freq: Four times a day (QID) | ORAL | Status: DC | PRN

## 2017-07-02 MED ORDER — ACETAMINOPHEN 650 MG RE SUPP: 650.0000 mg | Freq: Four times a day (QID) | RECTAL | Status: DC | PRN

## 2017-07-02 MED ORDER — AMLODIPINE BESYLATE 5 MG PO TABS
2.5000 mg | ORAL_TABLET | Freq: Every day | ORAL | Status: DC
  Administered 2017-07-03 – 2017-07-04 (×2): 2.5 mg via ORAL
  Filled 2017-07-02 (×2): qty 1

## 2017-07-02 MED ORDER — LOSARTAN POTASSIUM 50 MG PO TABS
100.0000 mg | ORAL_TABLET | Freq: Every day | ORAL | Status: DC
  Administered 2017-07-03 – 2017-07-04 (×2): 100 mg via ORAL
  Filled 2017-07-02 (×2): qty 2

## 2017-07-02 MED ORDER — ONDANSETRON HCL 4 MG PO TABS: 4.0000 mg | ORAL_TABLET | Freq: Four times a day (QID) | ORAL | Status: DC | PRN

## 2017-07-02 MED ORDER — SODIUM CHLORIDE 0.9 % IV BOLUS
250.0000 mL | Freq: Once | INTRAVENOUS | Status: AC
  Administered 2017-07-02: 250 mL via INTRAVENOUS

## 2017-07-02 MED ORDER — GUAIFENESIN ER 600 MG PO TB12
600.0000 mg | ORAL_TABLET | Freq: Two times a day (BID) | ORAL | Status: DC
  Administered 2017-07-02 – 2017-07-04 (×4): 600 mg via ORAL
  Filled 2017-07-02 (×5): qty 1

## 2017-07-02 MED ORDER — SODIUM CHLORIDE 0.9 % IV SOLN
1.0000 g | INTRAVENOUS | Status: DC
  Filled 2017-07-02: qty 10

## 2017-07-02 MED ORDER — SODIUM CHLORIDE 0.9 % IV SOLN
1.0000 g | Freq: Once | INTRAVENOUS | Status: AC
  Administered 2017-07-02: 1 g via INTRAVENOUS
  Filled 2017-07-02: qty 10

## 2017-07-02 MED ORDER — METOPROLOL SUCCINATE ER 50 MG PO TB24
150.0000 mg | ORAL_TABLET | Freq: Every day | ORAL | Status: DC
  Administered 2017-07-03 – 2017-07-04 (×2): 150 mg via ORAL
  Filled 2017-07-02 (×2): qty 3

## 2017-07-02 MED ORDER — SODIUM CHLORIDE 0.9 % IV SOLN: 500.0000 mg | INTRAVENOUS | Status: DC

## 2017-07-02 MED ORDER — IOPAMIDOL (ISOVUE-370) INJECTION 76%
INTRAVENOUS | Status: AC
  Filled 2017-07-02: qty 100

## 2017-07-02 MED ORDER — AZITHROMYCIN 500 MG IV SOLR
500.0000 mg | Freq: Once | INTRAVENOUS | Status: AC
  Administered 2017-07-02: 500 mg via INTRAVENOUS
  Filled 2017-07-02: qty 500

## 2017-07-02 MED ORDER — ALPRAZOLAM 0.5 MG PO TABS
0.5000 mg | ORAL_TABLET | Freq: Every evening | ORAL | Status: DC | PRN
  Administered 2017-07-02 – 2017-07-04 (×2): 0.5 mg via ORAL
  Filled 2017-07-02 (×2): qty 1

## 2017-07-02 MED ORDER — SODIUM CHLORIDE 0.9% FLUSH
3.0000 mL | Freq: Two times a day (BID) | INTRAVENOUS | Status: DC
  Administered 2017-07-02 – 2017-07-04 (×4): 3 mL via INTRAVENOUS

## 2017-07-02 MED ORDER — ONDANSETRON HCL 4 MG/2ML IJ SOLN: 4.0000 mg | Freq: Four times a day (QID) | INTRAMUSCULAR | Status: DC | PRN

## 2017-07-02 MED ORDER — ENOXAPARIN SODIUM 40 MG/0.4ML ~~LOC~~ SOLN
40.0000 mg | Freq: Every day | SUBCUTANEOUS | Status: DC
  Administered 2017-07-02 – 2017-07-03 (×2): 40 mg via SUBCUTANEOUS
  Filled 2017-07-02 (×2): qty 0.4

## 2017-07-02 NOTE — Patient Instructions (Signed)
Nice to meet you  Please go to the emergency department to get further testing.

## 2017-07-02 NOTE — ED Notes (Signed)
ED TO INPATIENT HANDOFF REPORT  Name/Age/Gender Louis [name] 62 y.o. male  Code Status    Code Status Orders  (From admission, onward)        Start     Ordered   07/02/17 2005  Full code  Continuous     07/02/17 2009    Code Status History    This patient has a current code status but no historical code status.    Advance Directive Documentation     Most Recent Value  Type of Advance Directive  Living will  Pre-existing out of facility DNR order (yellow form or pink MOST form)  -  "MOST" Form in Place?  -      Home/SNF/Other Home  Chief Complaint SOB / pulse ox 66  Level of Care/Admitting Diagnosis ED Disposition    ED Disposition Condition [place]: [place] [100102]  Level of Care: Stepdown [14]  Admit to SDU based on following criteria: Respiratory Distress:  Frequent assessment and/or intervention to maintain adequate ventilation/respiration, pulmonary toilet, and respiratory treatment.  Diagnosis: Acute respiratory failure with hypoxia and hypercapnia [facility]) [[id number]]  Admitting Physician: SHAROD, PETSCH [[id number]]  Attending Physician: TREQUAN, MARSOLEK [[id number]]  Estimated length of stay: past midnight tomorrow  Certification:: I certify this patient will need inpatient services for at least 2 midnights  PT Class (Do Not Modify): Inpatient [101]  PT Acc Code (Do Not Modify): Private [1]       Medical History Past Medical History:  Diagnosis Date  . BPH (benign prostatic hyperplasia)    on flomax  . Bundle branch block    per prior PCP records  . Diverticulosis    by colonoscopy  . Eczema    per prior PCP records  . Elevated PSA 2015   peaked 6s, s/p benign biopsy 2014, sees urology [name] ([place])  . Essential tremor   . GERD (gastroesophageal reflux disease)    per prior PCP records  . History of panic attacks    per prior PCP records  . HTN (hypertension)   . Mild intermittent asthma in  adult without complication   . Obesity, Class I, BMI 30-34.9   . Osteoporosis    DEXA 06/2013 with osteopenia - h/o ?wrist/hip fracture from AVN from chronic steroid use (asthma), took reclast for 1 year  . Renal artery stenosis in 1 of 2 vessels ([place]) 2011   by [place]  . Seasonal allergies   . Thoracic scoliosis childhood  . Transaminitis    per prior PCP records    Allergies Allergies  Allergen Reactions  . Accupril [Quinapril Hcl] Cough    IV Location/Drains/Wounds Patient Lines/Drains/Airways Status   Active Line/Drains/Airways    Name:   Placement date:   Placement time:   Site:   Days:   Peripheral IV 07/02/17 Left Antecubital   07/02/17    1643    Antecubital   less than 1          Labs/Imaging Results for orders placed or performed during the hospital encounter of 07/02/17 (from the past 48 hour(s))  Comprehensive metabolic panel     Status: Abnormal   Collection Time: 07/02/17  4:48 PM  Result Value Ref Range   Sodium 137 135 - 145 mmol/L   Potassium 4.8 3.5 - 5.1 mmol/L   Chloride 96 (L) 98 - 111 mmol/L    Comment: Please note change in reference range.   CO2 33 (H) 22 -  32 mmol/L   Glucose, Bld 112 (H) 70 - 99 mg/dL    Comment: Please note change in reference range.   BUN 22 (H) 6 - 20 mg/dL    Comment: Please note change in reference range.   Creatinine, Ser 1.02 0.61 - 1.24 mg/dL   Calcium 9.5 8.9 - 10.3 mg/dL   Total Protein 7.5 6.5 - 8.1 g/dL   Albumin 3.9 3.5 - 5.0 g/dL   AST 22 15 - 41 U/L   ALT 38 0 - 44 U/L    Comment: Please note change in reference range.   Alkaline Phosphatase 82 38 - 126 U/L   Total Bilirubin 0.7 0.3 - 1.2 mg/dL   GFR calc non Af Amer >60 >60 mL/min   GFR calc Af Amer >60 >60 mL/min    Comment: (NOTE) The eGFR has been calculated using the CKD EPI equation. This calculation has not been validated in all clinical situations. eGFR's persistently <60 mL/min signify possible Chronic Kidney Disease.    Anion gap 8 5 - 15     Comment: Performed at [facility], [place] [address], [place], [place] [address]  CBC with Differential/Platelet     Status: Abnormal   Collection Time: 07/02/17  4:48 PM  Result Value Ref Range   WBC 6.5 4.0 - 10.5 K/uL   RBC 5.22 4.22 - 5.81 MIL/uL   Hemoglobin 17.3 (H) 13.0 - 17.0 g/dL   HCT 54.8 (H) 39.0 - 52.0 %   MCV 105.0 (H) 78.0 - 100.0 fL   MCH 33.1 26.0 - 34.0 pg   MCHC 31.6 30.0 - 36.0 g/dL   RDW 14.8 11.5 - 15.5 %   Platelets 222 150 - 400 K/uL   Neutrophils Relative % 64 %   Neutro Abs 4.2 1.7 - 7.7 K/uL   Lymphocytes Relative 19 %   Lymphs Abs 1.2 0.7 - 4.0 K/uL   Monocytes Relative 15 %   Monocytes Absolute 1.0 0.1 - 1.0 K/uL   Eosinophils Relative 1 %   Eosinophils Absolute 0.1 0.0 - 0.7 K/uL   Basophils Relative 1 %   Basophils Absolute 0.0 0.0 - 0.1 K/uL    Comment: Performed at [facility], [place] [address], [place], [place] [address]  Brain natriuretic peptide     Status: Abnormal   Collection Time: 07/02/17  4:48 PM  Result Value Ref Range   B Natriuretic Peptide 232.6 (H) 0.0 - 100.0 pg/mL    Comment: Performed at [facility], [place] [address], [place], [place] [address]  Troponin I     Status: None   Collection Time: 07/02/17  4:48 PM  Result Value Ref Range   Troponin I <0.03 <0.03 ng/mL    Comment: Performed at [facility], [place] [address] [place], [place] [address]  I-Stat CG4 Lactic Acid, ED     Status: None   Collection Time: 07/02/17  4:51 PM  Result Value Ref Range   Lactic Acid, Venous 0.86 0.5 - 1.9 mmol/L  Blood gas, arterial     Status: Abnormal   Collection Time: 07/02/17  8:02 PM  Result Value Ref Range   O2 Content 4.0 L/min   Delivery systems NASAL CANNULA    pH, Arterial 7.305 (L) 7.350 - 7.450   pCO2 arterial 72.4 (HH) 32.0 - 48.0 mmHg    Comment: CRITICAL RESULT CALLED TO, READ BACK BY AND VERIFIED WITH: Fuller Plan, MD AT 2009 ON 07/02/2017 BY DANIEL  JONES,  RRT, RCP    pO2, Arterial 70.9 (L) 83.0 - 108.0 mmHg   Bicarbonate 35.0 (H) 20.0 - 28.0 mmol/L   Acid-Base Excess 5.6 (H) 0.0 - 2.0 mmol/L   O2 Saturation 90.9 %   Patient temperature 98.4    Collection site RIGHT RADIAL    Drawn by [id number]    Sample type ARTERIAL DRAW    Allens test (pass/fail) PASS PASS    Comment: Performed at [facility], [place] [address], [place], [place] [address]   Dg Chest 2 View  Result Date: 07/02/2017 CLINICAL DATA:  Shortness of breath for the last week, no chest pain, history of asthma EXAM: CHEST - 2 VIEW COMPARISON:  None. FINDINGS: There is recommend opacity medially at the left lung base with apparent air bronchograms, most consistent with patchy area of pneumonia. Some atelectasis is noted at the lung bases. There is cardiomegaly present and a degree of mild pulmonary vascular congestion cannot be excluded. However no definite pleural effusion is seen. No acute bony abnormality seen with thoracic scoliosis noted. IMPRESSION: 1. Opacity medially at the left lung base suspicious for pneumonia. Recommend follow-up. 2. Cardiomegaly.  Cannot exclude mild pulmonary vascular congestion. 3. Thoracolumbar scoliosis. Electronically Signed   By: Ivar Drape M.D.   On: 07/02/2017 16:01   Ct Angio Chest Pe W And/or Wo Contrast  Result Date: 07/02/2017 CLINICAL DATA:  Shortness of breath EXAM: CT ANGIOGRAPHY CHEST WITH CONTRAST TECHNIQUE: Multidetector CT imaging of the chest was performed using the standard protocol during bolus administration of intravenous contrast. Multiplanar CT image reconstructions and MIPs were obtained to evaluate the vascular anatomy. CONTRAST:  120m ISOVUE-370 IOPAMIDOL (ISOVUE-370) INJECTION 76% COMPARISON:  Chest radiograph July 02, 2017 FINDINGS: Cardiovascular: There is no demonstrable pulmonary embolus. There is no appreciable thoracic aortic aneurysm or dissection. There is aortic atherosclerosis. Visualized great vessels appear  unremarkable except for scattered foci calcification, not causing hemodynamically significant obstruction. There is no pericardial effusion or pericardial thickening. The main pulmonary outflow tract measures 3.5 cm in diameter, enlarged. Mediastinum/Nodes: Thyroid appears unremarkable. There is no appreciable thoracic adenopathy. No esophageal lesions are evident. Lungs/Pleura: There is bibasilar atelectasis. There is mild consolidation in each lung base, concerning for early pneumonia superimposed on atelectatic change. No pleural effusion or pleural thickening evident. Upper Abdomen: There is hepatic steatosis. There is a cyst arising from the upper pole of the left kidney measuring 4.2 x 4.0 cm. Visualized upper abdominal structures otherwise appear unremarkable. Musculoskeletal: There is marked lower thoracic dextroscoliosis. There old healed rib fractures on the left. No blastic or lytic bone lesions. No evident chest wall lesion. Review of the MIP images confirms the above findings. IMPRESSION: 1. No demonstrable pulmonary embolus. No thoracic aortic aneurysm or dissection. There is aortic and great vessel atherosclerosis. 2. Prominence of the main pulmonary outflow tract, a finding felt to be indicative of pulmonary arterial hypertension. 3. Lower lobe atelectatic change. Suspect early pneumonia in each posterior lung base. 4.  No evident thoracic adenopathy. 5.  Hepatic steatosis. Aortic Atherosclerosis (ICD10-I70.0). Electronically Signed   By: WLowella GripIII M.D.   On: 07/02/2017 18:55    Pending Labs Unresulted Labs (From admission, onward)   Start     Ordered   07/03/17 0500  HIV antibody (Routine Testing)  Tomorrow morning,   R     07/02/17 2009   07/03/17 0500  CBC  Tomorrow morning,   R     07/02/17 2009  07/03/17 6967  Basic metabolic panel  Tomorrow morning,   R     07/02/17 2009   07/02/17 2009  TSH  Add-on,   R     07/02/17 2009   07/02/17 2008  Troponin I  Now then every 6  hours,   R     07/02/17 2009   07/02/17 2007  Legionella Pneumophila Serogp 1 Ur Ag  Once,   R     07/02/17 2009   07/02/17 2004  Culture, sputum-assessment  Once,   R     07/02/17 2009   07/02/17 2004  Gram stain  Once,   R     07/02/17 2009   07/02/17 2004  Strep pneumoniae urinary antigen  Once,   R     07/02/17 2009   07/02/17 1638  Culture, blood (Routine X 2) w Reflex to ID Panel  BLOOD CULTURE X 2,   STAT     07/02/17 1637      Vitals/Pain Today's Vitals   07/02/17 1900 07/02/17 2000 07/02/17 2130 07/02/17 2138  BP: (!) 152/83 (!) 152/84 128/77   Pulse: 76 69 71   Resp: (!) 25 (!) 28 (!) 22   Temp:      TempSrc:      SpO2: 93% 92% 93% 93%  Weight:      Height:      PainSc:        Isolation Precautions No active isolations  Medications Medications  azithromycin (ZITHROMAX) 500 mg in sodium chloride 0.9 % 250 mL IVPB (500 mg Intravenous New Bag/Given 07/02/17 2134)  ALPRAZolam (XANAX) tablet 0.5 mg (has no administration in time range)  metoprolol succinate (TOPROL-XL) 24 hr tablet 150 mg (has no administration in time range)  losartan (COZAAR) tablet 100 mg (has no administration in time range)  fluorometholone (FML) 0.1 % ophthalmic suspension 1 drop (has no administration in time range)  amLODipine (NORVASC) tablet 2.5 mg (has no administration in time range)  enoxaparin (LOVENOX) injection 40 mg (has no administration in time range)  sodium chloride flush (NS) 0.9 % injection 3 mL (has no administration in time range)  acetaminophen (TYLENOL) tablet 650 mg (has no administration in time range)    Or  acetaminophen (TYLENOL) suppository 650 mg (has no administration in time range)  ondansetron (ZOFRAN) tablet 4 mg (has no administration in time range)    Or  ondansetron (ZOFRAN) injection 4 mg (has no administration in time range)  guaiFENesin (MUCINEX) 12 hr tablet 600 mg (has no administration in time range)  cefTRIAXone (ROCEPHIN) 1 g in sodium chloride 0.9 %  100 mL IVPB (has no administration in time range)  azithromycin (ZITHROMAX) 500 mg in sodium chloride 0.9 % 250 mL IVPB (has no administration in time range)  sodium chloride 0.9 % bolus 250 mL (250 mLs Intravenous New Bag/Given 07/02/17 2134)  ipratropium-albuterol (DUONEB) 0.5-2.5 (3) MG/3ML nebulizer solution 3 mL (3 mLs Nebulization Given 07/02/17 2137)  albuterol (PROVENTIL) (2.5 MG/3ML) 0.083% nebulizer solution 5 mg (5 mg Nebulization Given 07/02/17 1746)  iopamidol (ISOVUE-370) 76 % injection 100 mL (100 mLs Intravenous Contrast Given 07/02/17 1824)  cefTRIAXone (ROCEPHIN) 1 g in sodium chloride 0.9 % 100 mL IVPB (0 g Intravenous Stopped 07/02/17 2116)    Mobility walks

## 2017-07-02 NOTE — Telephone Encounter (Signed)
Patient flew to [place] one week ago today. Three to four days into the trip with a lot of walking, he noticed his breathing was more rapid than normal. He has athma and used his rescue inhaler for 2 days after noticing the change in his breathing. 40 breaths per minute, 84 bpm. He did not use the inhaler over the last 24 hours. Resting, he still notices the rapid breathing. At times, he does feel he is not getting a good deep breath in. He takes toprol-xl 50 MG daily as prescribed. Denies CP. He is currently at the [place] airport with his wife picking up their daughter and won't be available to be seen until 1:00p. When advised to be seen at the emergency room to rule out pe, stated his wife is with him and feels comfortable with seeing physician this afternoon instead of going to the ED. His PCP has no availability today. Please advise if Dr. Ethelene Hal would be willing to see patient this afternoon.   Please advise.  Reason for Disposition . Recent long-distance travel with prolonged time in car, bus, plane, or train (i.e., within past 2 weeks; 6 or  more hours duration)  Answer Assessment - Initial Assessment Questions 1. RESPIRATORY STATUS: "Describe your breathing?" (e.g., wheezing, shortness of breath, unable to speak, severe coughing)      Feels though he is breathing is rapid. Wheeze but he always has one with his asthma. 2. ONSET: "When did this breathing problem begin?" Friday, today is day 4     3. PATTERN "Does the difficult breathing come and go, or has it been constant since it started?"     constant over last few days with or without activity 4. SEVERITY: "How bad is your breathing?" (e.g., mild, moderate, severe)    - MILD: No SOB at rest, mild SOB with walking, speaks normally in sentences, can lay down, no retractions, pulse < 100.    - MODERATE: SOB at rest, SOB with minimal exertion and prefers to sit, cannot lie down flat, speaks in phrases, mild retractions, audible wheezing,  pulse 100-120.    - SEVERE: Very SOB at rest, speaks in single words, struggling to breathe, sitting hunched forward, retractions, pulse > 120      Moderate  heartrate is 84 bpm 5. RECURRENT SYMPTOM: "Have you had difficulty breathing before?" If so, ask: "When was the last time?" and "What happened that time?"      no 6. CARDIAC HISTORY: "Do you have any history of heart disease?" (e.g., heart attack, angina, bypass surgery, angioplasty)      Beta blocker 7. LUNG HISTORY: "Do you have any history of lung disease?"  (e.g., pulmonary embolus, asthma, emphysema)     asthma 8. CAUSE: "What do you think is causing the breathing problem?"     unsure 9. OTHER SYMPTOMS: "Do you have any other symptoms? (e.g., dizziness, runny nose, cough, chest pain, fever)     Headache this morning, runny nose on and off. 10. PREGNANCY: "Is there any chance you are pregnant?" "When was your last menstrual period?"       na 11. TRAVEL: "Have you traveled out of the country in the last month?" (e.g., travel history, exposures)       [place] 2 weeks ago and flew to [place] one week ago.  Protocols used: BREATHING DIFFICULTY-A-AH

## 2017-07-02 NOTE — ED Triage Notes (Signed)
Pt present SOB, plane trip several days ago (Tuesday thru Saturday) ) SOB since Tuesday. Denies chest pain, cough or sickness. EDP [place] present

## 2017-07-02 NOTE — Assessment & Plan Note (Signed)
Unclear as to the source of his hypoxemia.  He has been on trips to [place] and [place] recently.  No unilateral leg swelling or history of blood clots.  Does have history of asthma but no smoking history.  Has a history of scoliosis with possible for pneumothorax.  Initial pulse ox was around 67%.  This improved to 90% on 2 L nasal cannula of oxygen. -EKG and chest x-ray. -Advised that he be seen in the emergency department to have a further evaluation.  This may include further lab work and CT of his chest.  Discussed that he may need to leave with EMS and he declined.  Preferred to travel by personal vehicle.

## 2017-07-02 NOTE — H&P (Signed)
History and Physical    Louis [name] IRC:[id number] DOB: May 20, 1955 DOA: 07/02/2017  Referring MD/NP/PA: Julianne Rice, MD PCP: Ria Bush, MD  Patient coming from: PCP office  Chief Complaint: quick breathing   I have personally briefly reviewed patient's old medical records in [place]   HPI: Louis [name] is a 62 y.o. male with medical history significant of HTN, asthma, obesity, and thoracic scoliosis; who presented with complaints of quick breathing over the last 5 days.  Patient had recently taken a trip to [place] in [place] 1 week ago, and while there had developed what he describes as quick breathing.  He does not necessarily feel short of breath.   Symptoms worsened with activity and laying on his back.  He noted symptoms seem to improve while laying on his right side.  Associated symptoms include complaints of a mild headache, leg swelling, and fatigue.  Denies any significant fever, chills, cough, wheezing, chest pain, nausea, vomiting, abdominal pain, dysuria, calf pain, or recent sick contacts to his knowledge.  He gone to his PCP today due to symptoms and was seen to be hypoxic on room air and sent to the emergency department for further evaluation.  He is not on oxygen at baseline and does not wear CPAP mask to sleep at night.  He reports having bad reaction previously to steroids.  ED Course: Upon admission to the emergency department patient was noted to be afebrile, pulse 72-85, respirations 20-28, blood pressure 143/77-158/100, and O2 saturations as low as 67% with improvement to improved to 99% on 3-4 L nasal cannula oxygen.  Labs revealed WBC 6.5, lactic acid 0.86, hemoglobin 17.3, CO2 33, BUN 22, creatinine 1.02, troponin <0.03, and BNP 232.6.  Chest x-ray showed opacity in the medial left lung base suspicious for pneumonia and cardiomegaly with mild vascular congestion.  CT angiogram was obtained due to recent travel negative for signs of PE, but did know possible  early pneumonia both lung bases and signs of pulmonary hypertension.  Patient was given albuterol breathing treatment, azithromycin, and ceftriaxone.  TRH called to admit.   Review of Systems  Constitutional: Positive for malaise/fatigue. Negative for chills and fever.  HENT: Negative for ear discharge and nosebleeds.   Eyes: Negative for double vision and pain.  Respiratory: Positive for shortness of breath. Negative for cough, sputum production and wheezing.   Cardiovascular: Positive for leg swelling. Negative for chest pain.  Gastrointestinal: Negative for abdominal pain, constipation, diarrhea, nausea and vomiting.  Genitourinary: Negative for dysuria and flank pain.  Musculoskeletal: Negative for back pain and neck pain.  Skin: Negative for itching and rash.  Neurological: Positive for headaches. Negative for dizziness, focal weakness and loss of consciousness.  Endo/Heme/Allergies: Positive for environmental allergies.  Psychiatric/Behavioral: Negative for hallucinations and substance abuse.    Past Medical History:  Diagnosis Date  . BPH (benign prostatic hyperplasia)    on flomax  . Bundle branch block    per prior PCP records  . Diverticulosis    by colonoscopy  . Eczema    per prior PCP records  . Elevated PSA 2015   peaked 6s, s/p benign biopsy 2014, sees urology [name] ([place])  . Essential tremor   . GERD (gastroesophageal reflux disease)    per prior PCP records  . History of panic attacks    per prior PCP records  . HTN (hypertension)   . Mild intermittent asthma in adult without complication   . Obesity, Class I, BMI 30-34.9   .  Osteoporosis    DEXA 06/2013 with osteopenia - h/o ?wrist/hip fracture from AVN from chronic steroid use (asthma), took reclast for 1 year  . Renal artery stenosis in 1 of 2 vessels ([place]) 2011   by [place]  . Seasonal allergies   . Thoracic scoliosis childhood  . Transaminitis    per prior PCP records    Past Surgical History:    Procedure Laterality Date  . COLONOSCOPY  12/2013   polyps, int hem, diverticulosis, rpt 5 yrs ([place])  . ELBOW SURGERY Right 2008   golfer's elbow  . INGUINAL HERNIA REPAIR Bilateral childhood & ~1995  . NASAL SEPTUM SURGERY  2013   deviated septum  . US ECHOCARDIOGRAPHY  02/2009   WNL, EF >55% [facility])  . US RENAL/AORTA Right 05/2009   1-59% diameter reduction renal artery, rec rpt 2 yrs     reports that he has never smoked. He has never used smokeless tobacco. He reports that he drinks alcohol. He reports that he does not use drugs.  Allergies  Allergen Reactions  . Accupril [Quinapril Hcl] Cough    Family History  Problem Relation Age of Onset  . Cancer Father 72       colon  . Prostate cancer Father   . Diabetes Mother   . Alcohol abuse Mother   . Stroke Mother   . Cancer Maternal Grandmother        pancreatic  . Ataxia Brother   . Ataxia Sister   . CAD Neg Hx     Prior to Admission medications   Medication Sig Start Date End Date Taking? Authorizing Provider  albuterol (PROVENTIL HFA;VENTOLIN HFA) 108 (90 BASE) MCG/ACT inhaler Inhale 2 puffs into the lungs every 4 (four) hours as needed for wheezing or shortness of breath.   Yes [provider]  ALPRAZolam (XANAX) 0.5 MG tablet Take 1 tablet (0.5 mg total) by mouth at bedtime as needed for sleep. 01/13/17  Yes Ria Bush, MD  amLODipine (NORVASC) 5 MG tablet Take 2.5 mg by mouth daily.  12/18/16  Yes Ria Bush, MD  fluorometholone (FML) 0.1 % ophthalmic suspension Place 1 drop into both eyes 4 (four) times daily. Shake liquid before administering drops 04/04/17  Yes [provider]  losartan (COZAAR) 100 MG tablet TAKE ONE TABLET BY MOUTH ONE TIME DAILY  04/17/17  Yes Ria Bush, MD  LOTEMAX 0.5 % GEL Place 1 drop into both eyes daily as needed (dry eyes).  01/30/17  Yes [provider]  metoprolol succinate (TOPROL-XL) 50 MG 24 hr tablet Take 3 tablets (150 mg total) by  mouth daily. 07/31/16  Yes Ria Bush, MD  urea (CARMOL) 40 % CREA apply to affected area twice a day for THICKEST AREAS if needed for dry skin 01/08/17  Yes [provider]  Vitamin D, Ergocalciferol, (DRISDOL) 50000 UNITS CAPS capsule Take 50,000 Units by mouth every 7 (seven) days.   Yes [provider]    Physical Exam:  Constitutional: Obese male who appears in NAD, calm, comfortable Vitals:   07/02/17 1644 07/02/17 1746 07/02/17 1800 07/02/17 1900  BP:   (!) 143/77 (!) 152/83  Pulse:   72 76  Resp:   20 (!) 25  Temp:      TempSrc:      SpO2:  93% 99% 93%  Weight: 108.4 kg (239 lb)     Height: 5\' 11"  (1.803 m)      Eyes: PERRL, lids and conjunctivae normal ENMT: Mucous membranes  are moist. Posterior pharynx clear of any exudate or lesions.Normal dentition.  Neck: normal, supple, no masses, no thyromegaly Respiratory: Mildly tachypneic with decreased aeration.  Patient able to talk in complete sentences.  No significant rales, rhonchi, or wheezes noted.  Patient currently on 4 L nasal cannula oxygen with O2 sats 93%.   Cardiovascular: Regular rate and rhythm, no murmurs / rubs / gallops.  Trace to 1+ bilateral pitting lower extremity edema. 2+ pedal pulses. No carotid bruits.  Abdomen: no tenderness, no masses palpated. No hepatosplenomegaly. Bowel sounds positive.  Musculoskeletal: no clubbing / cyanosis. Thoracic scoliosis noted. Good ROM, no contractures. Normal muscle tone.  Skin: no rashes, lesions, ulcers. No induration Neurologic: CN 2-12 grossly intact. Sensation intact, DTR normal. Strength 5/5 in all 4.  Tremor at baseline. Psychiatric: Normal judgment and insight. Alert and oriented x 3. Normal mood.     Labs on Admission: I have personally reviewed following labs and imaging studies  CBC: Recent Labs  Lab 07/02/17 1648  WBC 6.5  NEUTROABS 4.2  HGB 17.3*  HCT 54.8*  MCV 105.0*  PLT 474   Basic Metabolic Panel: Recent Labs  Lab  07/02/17 1648  NA 137  K 4.8  CL 96*  CO2 33*  GLUCOSE 112*  BUN 22*  CREATININE 1.02  CALCIUM 9.5   GFR: Estimated Creatinine Clearance: 96.4 mL/min (by C-G formula based on SCr of 1.02 mg/dL). Liver Function Tests: Recent Labs  Lab 07/02/17 1648  AST 22  ALT 38  ALKPHOS 82  BILITOT 0.7  PROT 7.5  ALBUMIN 3.9   No results for input(s): LIPASE, AMYLASE in the last 168 hours. No results for input(s): AMMONIA in the last 168 hours. Coagulation Profile: No results for input(s): INR, PROTIME in the last 168 hours. Cardiac Enzymes: Recent Labs  Lab 07/02/17 1648  TROPONINI <0.03   BNP (last 3 results) No results for input(s): PROBNP in the last 8760 hours. HbA1C: No results for input(s): HGBA1C in the last 72 hours. CBG: No results for input(s): GLUCAP in the last 168 hours. Lipid Profile: No results for input(s): CHOL, HDL, LDLCALC, TRIG, CHOLHDL, LDLDIRECT in the last 72 hours. Thyroid Function Tests: No results for input(s): TSH, T4TOTAL, FREET4, T3FREE, THYROIDAB in the last 72 hours. Anemia Panel: No results for input(s): VITAMINB12, FOLATE, FERRITIN, TIBC, IRON, RETICCTPCT in the last 72 hours. Urine analysis:    Component Value Date/Time   APPEARANCEUR Clear 02/27/2017 1555   GLUCOSEU Negative 02/27/2017 1555   BILIRUBINUR Negative 02/27/2017 1555   PROTEINUR Negative 02/27/2017 1555   NITRITE Negative 02/27/2017 1555   LEUKOCYTESUR Negative 02/27/2017 1555   Sepsis Labs: No results found for this or any previous visit (from the past 240 hour(s)).   Radiological Exams on Admission: Dg Chest 2 View  Result Date: 07/02/2017 CLINICAL DATA:  Shortness of breath for the last week, no chest pain, history of asthma EXAM: CHEST - 2 VIEW COMPARISON:  None. FINDINGS: There is recommend opacity medially at the left lung base with apparent air bronchograms, most consistent with patchy area of pneumonia. Some atelectasis is noted at the lung bases. There is  cardiomegaly present and a degree of mild pulmonary vascular congestion cannot be excluded. However no definite pleural effusion is seen. No acute bony abnormality seen with thoracic scoliosis noted. IMPRESSION: 1. Opacity medially at the left lung base suspicious for pneumonia. Recommend follow-up. 2. Cardiomegaly.  Cannot exclude mild pulmonary vascular congestion. 3. Thoracolumbar scoliosis. Electronically Signed   By: Eddie Dibbles  Alvester Chou M.D.   On: 07/02/2017 16:01   Ct Angio Chest Pe W And/or Wo Contrast  Result Date: 07/02/2017 CLINICAL DATA:  Shortness of breath EXAM: CT ANGIOGRAPHY CHEST WITH CONTRAST TECHNIQUE: Multidetector CT imaging of the chest was performed using the standard protocol during bolus administration of intravenous contrast. Multiplanar CT image reconstructions and MIPs were obtained to evaluate the vascular anatomy. CONTRAST:  184mL ISOVUE-370 IOPAMIDOL (ISOVUE-370) INJECTION 76% COMPARISON:  Chest radiograph July 02, 2017 FINDINGS: Cardiovascular: There is no demonstrable pulmonary embolus. There is no appreciable thoracic aortic aneurysm or dissection. There is aortic atherosclerosis. Visualized great vessels appear unremarkable except for scattered foci calcification, not causing hemodynamically significant obstruction. There is no pericardial effusion or pericardial thickening. The main pulmonary outflow tract measures 3.5 cm in diameter, enlarged. Mediastinum/Nodes: Thyroid appears unremarkable. There is no appreciable thoracic adenopathy. No esophageal lesions are evident. Lungs/Pleura: There is bibasilar atelectasis. There is mild consolidation in each lung base, concerning for early pneumonia superimposed on atelectatic change. No pleural effusion or pleural thickening evident. Upper Abdomen: There is hepatic steatosis. There is a cyst arising from the upper pole of the left kidney measuring 4.2 x 4.0 cm. Visualized upper abdominal structures otherwise appear unremarkable.  Musculoskeletal: There is marked lower thoracic dextroscoliosis. There old healed rib fractures on the left. No blastic or lytic bone lesions. No evident chest wall lesion. Review of the MIP images confirms the above findings. IMPRESSION: 1. No demonstrable pulmonary embolus. No thoracic aortic aneurysm or dissection. There is aortic and great vessel atherosclerosis. 2. Prominence of the main pulmonary outflow tract, a finding felt to be indicative of pulmonary arterial hypertension. 3. Lower lobe atelectatic change. Suspect early pneumonia in each posterior lung base. 4.  No evident thoracic adenopathy. 5.  Hepatic steatosis. Aortic Atherosclerosis (ICD10-I70.0). Electronically Signed   By: Lowella Grip III M.D.   On: 07/02/2017 18:55    EKG: Independently reviewed.  Sinus rhythm at 75 bpm with right axis deviation, first-degree heart block, and left atrial abnormality.  Assessment/Plan Acute respiratory failure with hypoxia and hypercapnia, history of mild intermittent asthma: Patient presents with complaints of increasing respiratory rate found to have O2 sats in the 60s on room air.  No significant wheezing noted on physical exam.  CT angiogram revealed no pulmonary embolus, possible signs of pulmonary artery hypertension, and early pneumonia in the bilateral lower lung bases.  Patient was placed on nasal cannula oxygen with improvement of O2 saturations.  Question obesity hypoventilation syndrome. - Admit to a telemetry bed - Continuous pulse oximetry with nasal cannula oxygen as needed to maintain O2 saturations  - Wean oxygen when able - Check i-STAT ABG(pH 7.305, PCO2 73, PO2 71 on 4 L)  - BiPAP PRN - Patient would benefit from outpatient sleep study     - DuoNeb's prn shortness of breath/wheezing  Suspect community-acquired pneumonia: Acute.  Patient denies any symptoms of cough or fever or chills.  Blood cultures were obtained and patient started empirically antibiotics of ceftriaxone  and azithromycin. - Focus pneumonia order set initiated - Check blood and sputum cultures - Continue empiric antibiotics of ceftriaxone and azithromycin - Mucinex   Cardiomegaly, pulmonary artery hypertension: As seen on CT angiogram.  Last echocardiogram appears to have been done in 2011 with EF greater than 55% at that time.  No significant signs of fluid overload noted at this time on physical exam. - Monitor intake and output - Daily weights - Trend troponins - May warrant repeat echocardiogram at  some point  Essential hypertension - Continue metoprolol, losartan, and amlodipine  Polycythemia vs. hemoconcentration: Acute.  Hemoglobin noted to be elevated at 17.3 with elevated MCV.  Patient reports normal p.o. intake.  Labs revealed possible signs of dehydration with elevated BUN to creatinine ratio, but hypoxia and also will lead to increased blood counts. - Recheck CBC in a.m.  Obesity BMI noted to be 33.31   - Counsel on the need of weight loss  DVT prophylaxis: Lovenox Code Status: Full Family Communication: No family present at bedside Disposition Plan: TBD  Consults called: none Admission status: inpatient  Norval Morton MD [facility] Pager [phone]   If 7PM-7AM, please contact night-coverage [email or web address] Password [facility]  07/02/2017, 7:38 PM

## 2017-07-02 NOTE — ED Notes (Signed)
ED Provider at bedside. 

## 2017-07-02 NOTE — ED Provider Notes (Signed)
[facility] DEPT Provider Note   CSN: [id number] Arrival date & time: 07/02/17  1620     History   Chief Complaint Chief Complaint  Patient presents with  . Shortness of Breath    HPI FADI MENTER is a 62 y.o. male.  HPI Patient presents with 1 week of progressive shortness of breath.  Denies chest pain, cough, fever or chills.  Minimal lower extremity swelling.  Patient with recent flight to [facility].  Was seen in his primary physician's office today and was hypoxic with O2 saturations in the 60s.  Was referred to the emergency department for evaluation. Past Medical History:  Diagnosis Date  . BPH (benign prostatic hyperplasia)    on flomax  . Bundle branch block    per prior PCP records  . Diverticulosis    by colonoscopy  . Eczema    per prior PCP records  . Elevated PSA 2015   peaked 6s, s/p benign biopsy 2014, sees urology [name] ([place])  . Essential tremor   . GERD (gastroesophageal reflux disease)    per prior PCP records  . History of panic attacks    per prior PCP records  . HTN (hypertension)   . Mild intermittent asthma in adult without complication   . Obesity, Class I, BMI 30-34.9   . Osteoporosis    DEXA 06/2013 with osteopenia - h/o ?wrist/hip fracture from AVN from chronic steroid use (asthma), took reclast for 1 year  . Renal artery stenosis in 1 of 2 vessels ([place]) 2011   by [place]  . Seasonal allergies   . Thoracic scoliosis childhood  . Transaminitis    per prior PCP records    Patient Active Problem List   Diagnosis Date Noted  . SOB (shortness of breath) 07/02/2017  . Exposure keratitis 02/17/2017  . Pedal edema 12/18/2016  . Transaminitis 12/18/2016  . Bilateral pes planus 12/18/2016  . Psoriasis-eczema overlap condition 07/30/2016  . High serum high density lipoprotein (HDL) 09/26/2015  . Epistaxis 09/26/2015  . Renal artery stenosis in 1 of 2 vessels ([place])   . Essential tremor   . Elevated PSA   .  GERD (gastroesophageal reflux disease)   . Health maintenance examination 10/08/2014  . Anxiety state 10/08/2014  . Obesity, Class I, BMI 30-34.9   . Mild intermittent asthma in adult without complication   . Seasonal allergies   . HTN (hypertension)   . Scoliosis   . Osteoporosis     Past Surgical History:  Procedure Laterality Date  . COLONOSCOPY  12/2013   polyps, int hem, diverticulosis, rpt 5 yrs ([place])  . ELBOW SURGERY Right 2008   golfer's elbow  . INGUINAL HERNIA REPAIR Bilateral childhood & ~1995  . NASAL SEPTUM SURGERY  2013   deviated septum  . US ECHOCARDIOGRAPHY  02/2009   WNL, EF >55% [facility])  . US RENAL/AORTA Right 05/2009   1-59% diameter reduction renal artery, rec rpt 2 yrs        Home Medications    Prior to Admission medications   Medication Sig Start Date End Date Taking? Authorizing Provider  albuterol (PROVENTIL HFA;VENTOLIN HFA) 108 (90 BASE) MCG/ACT inhaler Inhale 2 puffs into the lungs every 4 (four) hours as needed for wheezing or shortness of breath.   Yes [provider]  ALPRAZolam (XANAX) 0.5 MG tablet Take 1 tablet (0.5 mg total) by mouth at bedtime as needed for sleep. 01/13/17  Yes Ria Bush, MD  amLODipine (NORVASC) 5 MG  tablet Take 2.5 mg by mouth daily.  12/18/16  Yes Ria Bush, MD  fluorometholone (FML) 0.1 % ophthalmic suspension Place 1 drop into both eyes 4 (four) times daily. Shake liquid before administering drops 04/04/17  Yes [provider]  losartan (COZAAR) 100 MG tablet TAKE ONE TABLET BY MOUTH ONE TIME DAILY  04/17/17  Yes Ria Bush, MD  LOTEMAX 0.5 % GEL Place 1 drop into both eyes daily as needed (dry eyes).  01/30/17  Yes [provider]  metoprolol succinate (TOPROL-XL) 50 MG 24 hr tablet Take 3 tablets (150 mg total) by mouth daily. 07/31/16  Yes Ria Bush, MD  urea (CARMOL) 40 % CREA apply to affected area twice a day for THICKEST AREAS if needed for dry skin 01/08/17  Yes  [provider]  Vitamin D, Ergocalciferol, (DRISDOL) 50000 UNITS CAPS capsule Take 50,000 Units by mouth every 7 (seven) days.   Yes [provider]    Family History Family History  Problem Relation Age of Onset  . Cancer Father 50       colon  . Prostate cancer Father   . Diabetes Mother   . Alcohol abuse Mother   . Stroke Mother   . Cancer Maternal Grandmother        pancreatic  . Ataxia Brother   . Ataxia Sister   . CAD Neg Hx     Social History Social History   Tobacco Use  . Smoking status: Never Smoker  . Smokeless tobacco: Never Used  Substance Use Topics  . Alcohol use: Yes    Alcohol/week: 0.0 oz    Comment: Social  . Drug use: No     Allergies   Accupril [quinapril hcl]   Review of Systems Review of Systems  Constitutional: Negative for chills and fever.  Respiratory: Positive for shortness of breath. Negative for cough and wheezing.   Cardiovascular: Positive for leg swelling. Negative for chest pain and palpitations.  Gastrointestinal: Negative for abdominal distention, abdominal pain, constipation, diarrhea, nausea and vomiting.  Musculoskeletal: Negative for back pain and myalgias.  Skin: Negative for rash and wound.  Neurological: Negative for dizziness, weakness, light-headedness, numbness and headaches.  All other systems reviewed and are negative.    Physical Exam Updated Vital Signs BP (!) 152/83   Pulse 76   Temp 98.4 F (36.9 C) (Oral)   Resp (!) 25   Ht 5\' 11"  (1.803 m)   Wt 108.4 kg (239 lb)   SpO2 93%   BMI 33.33 kg/m   Physical Exam  Constitutional: He is oriented to person, place, and time. He appears well-developed and well-nourished. He appears distressed.  HENT:  Head: Normocephalic and atraumatic.  Mouth/Throat: Oropharynx is clear and moist. No oropharyngeal exudate.  Eyes: Pupils are equal, round, and reactive to light. EOM are normal.  Neck: Normal range of motion. Neck supple.  Cardiovascular:  Normal rate and regular rhythm. Exam reveals no gallop and no friction rub.  No murmur heard. Pulmonary/Chest:  Shallow, rapid breaths.  Rhonchi in the left base.  Diminished breath sounds in the right lung field.  Abdominal: Soft. Bowel sounds are normal. He exhibits distension. There is no tenderness. There is no rebound and no guarding.  Diffuse abdominal distention with ventral abdominal wall hernia.  Abdomen is soft and nontender.  Musculoskeletal: Normal range of motion. He exhibits edema. He exhibits no tenderness.  1+ edema bilateral lower extremities.  Right calf mildly enlarged compared to left.  Distal pulses intact.  Lymphadenopathy:    He has no cervical adenopathy.  Neurological: He is alert and oriented to person, place, and time.  Moves all extremities without focal deficit.  Sensation intact.  Skin: Skin is warm and dry. Capillary refill takes less than 2 seconds. No rash noted. No erythema.  Psychiatric: He has a normal mood and affect. His behavior is normal.  Nursing note and vitals reviewed.    ED Treatments / Results  Labs (all labs ordered are listed, but only abnormal results are displayed) Labs Reviewed  COMPREHENSIVE METABOLIC PANEL - Abnormal; Notable for the following components:      Result Value   Chloride 96 (*)    CO2 33 (*)    Glucose, Bld 112 (*)    BUN 22 (*)    All other components within normal limits  CBC WITH DIFFERENTIAL/PLATELET - Abnormal; Notable for the following components:   Hemoglobin 17.3 (*)    HCT 54.8 (*)    MCV 105.0 (*)    All other components within normal limits  BRAIN NATRIURETIC PEPTIDE - Abnormal; Notable for the following components:   B Natriuretic Peptide 232.6 (*)    All other components within normal limits  CULTURE, BLOOD (ROUTINE X 2)  CULTURE, BLOOD (ROUTINE X 2)  TROPONIN I  I-STAT CG4 LACTIC ACID, ED    EKG EKG Interpretation  Date/Time:  Tuesday July 02 2017 16:33:58 EDT Ventricular Rate:  75 PR  Interval:    QRS Duration: 108 QT Interval:  389 QTC Calculation: 432 R Axis:   96 Text Interpretation:  Sinus rhythm Probable left atrial enlargement Right axis deviation Minimal ST depression Confirmed by Julianne Rice [phone]) on 07/02/2017 4:44:51 PM   Radiology Dg Chest 2 View  Result Date: 07/02/2017 CLINICAL DATA:  Shortness of breath for the last week, no chest pain, history of asthma EXAM: CHEST - 2 VIEW COMPARISON:  None. FINDINGS: There is recommend opacity medially at the left lung base with apparent air bronchograms, most consistent with patchy area of pneumonia. Some atelectasis is noted at the lung bases. There is cardiomegaly present and a degree of mild pulmonary vascular congestion cannot be excluded. However no definite pleural effusion is seen. No acute bony abnormality seen with thoracic scoliosis noted. IMPRESSION: 1. Opacity medially at the left lung base suspicious for pneumonia. Recommend follow-up. 2. Cardiomegaly.  Cannot exclude mild pulmonary vascular congestion. 3. Thoracolumbar scoliosis. Electronically Signed   By: Ivar Drape M.D.   On: 07/02/2017 16:01   Ct Angio Chest Pe W And/or Wo Contrast  Result Date: 07/02/2017 CLINICAL DATA:  Shortness of breath EXAM: CT ANGIOGRAPHY CHEST WITH CONTRAST TECHNIQUE: Multidetector CT imaging of the chest was performed using the standard protocol during bolus administration of intravenous contrast. Multiplanar CT image reconstructions and MIPs were obtained to evaluate the vascular anatomy. CONTRAST:  150mL ISOVUE-370 IOPAMIDOL (ISOVUE-370) INJECTION 76% COMPARISON:  Chest radiograph July 02, 2017 FINDINGS: Cardiovascular: There is no demonstrable pulmonary embolus. There is no appreciable thoracic aortic aneurysm or dissection. There is aortic atherosclerosis. Visualized great vessels appear unremarkable except for scattered foci calcification, not causing hemodynamically significant obstruction. There is no pericardial effusion or  pericardial thickening. The main pulmonary outflow tract measures 3.5 cm in diameter, enlarged. Mediastinum/Nodes: Thyroid appears unremarkable. There is no appreciable thoracic adenopathy. No esophageal lesions are evident. Lungs/Pleura: There is bibasilar atelectasis. There is mild consolidation in each lung base, concerning for early pneumonia superimposed on atelectatic change. No pleural effusion or pleural thickening evident. Upper  Abdomen: There is hepatic steatosis. There is a cyst arising from the upper pole of the left kidney measuring 4.2 x 4.0 cm. Visualized upper abdominal structures otherwise appear unremarkable. Musculoskeletal: There is marked lower thoracic dextroscoliosis. There old healed rib fractures on the left. No blastic or lytic bone lesions. No evident chest wall lesion. Review of the MIP images confirms the above findings. IMPRESSION: 1. No demonstrable pulmonary embolus. No thoracic aortic aneurysm or dissection. There is aortic and great vessel atherosclerosis. 2. Prominence of the main pulmonary outflow tract, a finding felt to be indicative of pulmonary arterial hypertension. 3. Lower lobe atelectatic change. Suspect early pneumonia in each posterior lung base. 4.  No evident thoracic adenopathy. 5.  Hepatic steatosis. Aortic Atherosclerosis (ICD10-I70.0). Electronically Signed   By: Lowella Grip III M.D.   On: 07/02/2017 18:55    Procedures Procedures (including critical care time)  Medications Ordered in ED Medications  cefTRIAXone (ROCEPHIN) 1 g in sodium chloride 0.9 % 100 mL IVPB (has no administration in time range)  azithromycin (ZITHROMAX) 500 mg in sodium chloride 0.9 % 250 mL IVPB (has no administration in time range)  albuterol (PROVENTIL) (2.5 MG/3ML) 0.083% nebulizer solution 5 mg (5 mg Nebulization Given 07/02/17 1746)  iopamidol (ISOVUE-370) 76 % injection 100 mL (100 mLs Intravenous Contrast Given 07/02/17 1824)    CRITICAL CARE Performed by: Julianne Rice Total critical care time: 30 minutes Critical care time was exclusive of separately billable procedures and treating other patients. Critical care was necessary to treat or prevent imminent or life-threatening deterioration. Critical care was time spent personally by me on the following activities: development of treatment plan with patient and/or surrogate as well as nursing, discussions with consultants, evaluation of patient's response to treatment, examination of patient, obtaining history from patient or surrogate, ordering and performing treatments and interventions, ordering and review of laboratory studies, ordering and review of radiographic studies, pulse oximetry and re-evaluation of patient's condition. Initial Impression / Assessment and Plan / ED Course  I have reviewed the triage vital signs and the nursing notes.  Pertinent labs & imaging results that were available during my care of the patient were reviewed by me and considered in my medical decision making (see chart for details).    X-ray with left lower lobe infiltrates concerning for early pneumonia.  CT Angie of the chest without evidence of PE.  Again noted developing pneumonia in the left lower lobe.  Not sure this completely explains the patient's hypoxia.  Blood cultures drawn.  Started on antibiotics for community acquired pneumonia.  Discussed with hospitalist will see patient in the emergency department and admit.   Final Clinical Impressions(s) / ED Diagnoses   Final diagnoses:  Community acquired pneumonia of left lower lobe of lung [facility])  Hypoxia    ED Discharge Orders    None       Julianne Rice, MD 07/02/17 1950

## 2017-07-02 NOTE — Telephone Encounter (Signed)
Dr. Ethelene Hal is full, please advise if [facility] or Dr. Raeford Razor will see patient.

## 2017-07-02 NOTE — ED Triage Notes (Addendum)
Patient here from PCP with complaints of SOB x1 week increased over the past 2 days after coming from a trip to [place]. Hypoxic at office. Possible pneumonia.

## 2017-07-02 NOTE — ED Notes (Signed)
PT AMBULATED TO BATHROOM. TACHYPNEA RR 35 AND HYPOXIA  02 SAT 60%. PT CYANOTIC. PT ALERT AND ACTIVE WITH CARE. FAMILY PRESENT. PT WITHOUT OXYGEN DURING AMBULATION. PT DENIES CHEST PAIN OR ANY PAIN. PT WITHOUT OXYGEN DURING THIS AMBULATION. ENCOURAGE PT TO CALL FOR ASSISTANCE AND TO NOT GET OOB. PT AND FAMILY AGREED AND UNDERSTOOD PLAN OF CARE FOR PT'S SAFETY

## 2017-07-02 NOTE — Progress Notes (Signed)
Louis [name] - 62 y.o. male MRN [id number]  Date of birth: [birth year]  SUBJECTIVE:  Including CC & ROS.  Chief Complaint  Patient presents with  . Shortness of Breath    Louis [name] is a 62 y.o. male that is presenting with shortness of breath. Ongoing for five days. He started feeling some shortness of breath while on his trip to [place]. He experienced some shortness of breath with activity. Increasing over the past two days. Denies chest pain. Admits to headaches in the morning improves throughout the day. Denies dizziness. Denies changes in medications. Symptoms are worse when he lies down. No history of heart failure.   Review of his blood work from 7/30 shows a low white count.  It also shows an elevated potassium.  He also had elevated liver enzymes at that time.   Review of Systems  Constitutional: Negative for fever.  HENT: Negative for trouble swallowing.   Respiratory: Positive for shortness of breath.   Cardiovascular: Negative for chest pain.  Gastrointestinal: Negative for abdominal pain.  Musculoskeletal: Negative for gait problem.  Skin: Negative for color change.  Neurological: Negative for weakness.  Hematological: Negative for adenopathy.  Psychiatric/Behavioral: Negative for agitation.    HISTORY: Past Medical, Surgical, Social, and Family History Reviewed & Updated per EMR.   Pertinent Historical Findings include:  Past Medical History:  Diagnosis Date  . BPH (benign prostatic hyperplasia)    on flomax  . Bundle branch block    per prior PCP records  . Diverticulosis    by colonoscopy  . Eczema    per prior PCP records  . Elevated PSA 2015   peaked 6s, s/p benign biopsy 2014, sees urology [name] ([place])  . Essential tremor   . GERD (gastroesophageal reflux disease)    per prior PCP records  . History of panic attacks    per prior PCP records  . HTN (hypertension)   . Mild intermittent asthma in adult without complication   . Obesity, Class I,  BMI 30-34.9   . Osteoporosis    DEXA 06/2013 with osteopenia - h/o ?wrist/hip fracture from AVN from chronic steroid use (asthma), took reclast for 1 year  . Renal artery stenosis in 1 of 2 vessels ([place]) 2011   by [place]  . Seasonal allergies   . Thoracic scoliosis childhood  . Transaminitis    per prior PCP records    Past Surgical History:  Procedure Laterality Date  . COLONOSCOPY  12/2013   polyps, int hem, diverticulosis, rpt 5 yrs ([place])  . ELBOW SURGERY Right 2008   golfer's elbow  . INGUINAL HERNIA REPAIR Bilateral childhood & ~1995  . NASAL SEPTUM SURGERY  2013   deviated septum  . US ECHOCARDIOGRAPHY  02/2009   WNL, EF >55% [facility])  . US RENAL/AORTA Right 05/2009   1-59% diameter reduction renal artery, rec rpt 2 yrs    Allergies  Allergen Reactions  . Accupril [Quinapril Hcl] Cough    Family History  Problem Relation Age of Onset  . Cancer Father 77       colon  . Prostate cancer Father   . Diabetes Mother   . Alcohol abuse Mother   . Stroke Mother   . Cancer Maternal Grandmother        pancreatic  . Ataxia Brother   . Ataxia Sister   . CAD Neg Hx      Social History   Socioeconomic History  . Marital status: Married  Spouse name: Not on file  . Number of children: Not on file  . Years of education: Not on file  . Highest education level: Not on file  Occupational History  . Not on file  Social Needs  . Financial resource strain: Not on file  . Food insecurity:    Worry: Not on file    Inability: Not on file  . Transportation needs:    Medical: Not on file    Non-medical: Not on file  Tobacco Use  . Smoking status: Never Smoker  . Smokeless tobacco: Never Used  Substance and Sexual Activity  . Alcohol use: Yes    Alcohol/week: 0.0 oz    Comment: Social  . Drug use: No  . Sexual activity: Yes    Birth control/protection: None  Lifestyle  . Physical activity:    Days per week: Not on file    Minutes per session: Not on file  . Stress:  Not on file  Relationships  . Social connections:    Talks on phone: Not on file    Gets together: Not on file    Attends religious service: Not on file    Active member of club or organization: Not on file    Attends meetings of clubs or organizations: Not on file    Relationship status: Not on file  . Intimate partner violence:    Fear of current or ex partner: Not on file    Emotionally abused: Not on file    Physically abused: Not on file    Forced sexual activity: Not on file  Other Topics Concern  . 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, was Chief Executive Officer   Edu: [employer], masters in divinity    Activity: TRX and cardio and pilates   Diet: good water, fruits/vegetables daily     PHYSICAL EXAM:  VS: BP (!) 158/70 (BP Location: Left Arm, Patient Position: Sitting, Cuff Size: Normal)   Pulse 85   Ht 5\' 11"  (1.803 m)   Wt 239 lb (108.4 kg)   SpO2 (!) 67%   BMI 33.33 kg/m  Physical Exam Gen: NAD, alert, cooperative with exam,  ENT: normal lips, normal nasal mucosa,  Eye: normal EOM, normal conjunctiva and lids CV:  no edema, +2 pedal pulses, regular rate and rhythm, S1-S2   Resp: no accessory muscle use, non-labored, clear to auscultation bilaterally, no crackles or wheezes  Skin: no rashes, no areas of induration  Neuro: normal tone, normal sensation to touch Psych:  normal insight, alert and oriented MSK: Normal gait, normal strength, scoliosis present   EKG interpretation: Normal sinus rhythm.  Pulse ox achieved 90% on 2 L O2 of nasal cannula.   ASSESSMENT & PLAN:   SOB (shortness of breath) Unclear as to the source of his hypoxemia.  He has been on trips to [place] and [place] recently.  No unilateral leg swelling or history of blood clots.  Does have history of asthma but no smoking history.  Has a history of scoliosis with possible for pneumothorax.  Initial pulse  ox was around 67%.  This improved to 90% on 2 L nasal cannula of oxygen. -EKG and chest x-ray. -Advised that he be seen in the emergency department to have a further evaluation.  This may include further lab work and CT of his chest.  Discussed that he may need to leave  with EMS and he declined.  Preferred to travel by personal vehicle.

## 2017-07-03 ENCOUNTER — Other Ambulatory Visit: Payer: Self-pay

## 2017-07-03 ENCOUNTER — Inpatient Hospital Stay (HOSPITAL_COMMUNITY): Payer: 59

## 2017-07-03 DIAGNOSIS — J9601 Acute respiratory failure with hypoxia: Principal | ICD-10-CM

## 2017-07-03 DIAGNOSIS — J9602 Acute respiratory failure with hypercapnia: Secondary | ICD-10-CM

## 2017-07-03 DIAGNOSIS — I5030 Unspecified diastolic (congestive) heart failure: Secondary | ICD-10-CM

## 2017-07-03 LAB — HIV ANTIBODY (ROUTINE TESTING W REFLEX): HIV Screen 4th Generation wRfx: NONREACTIVE

## 2017-07-03 LAB — BASIC METABOLIC PANEL
Anion gap: 8 (ref 5–15)
BUN: 19 mg/dL (ref 6–20)
CO2: 35 mmol/L — ABNORMAL HIGH (ref 22–32)
Calcium: 9.2 mg/dL (ref 8.9–10.3)
Chloride: 96 mmol/L — ABNORMAL LOW (ref 98–111)
Creatinine, Ser: 1.04 mg/dL (ref 0.61–1.24)
GFR calc Af Amer: >60 mL/min (ref >60)
GFR calc non Af Amer: >60 mL/min (ref >60)
Glucose, Bld: 110 mg/dL — ABNORMAL HIGH (ref 70–99)
Potassium: 4.5 mmol/L (ref 3.5–5.1)
Sodium: 139 mmol/L (ref 135–145)

## 2017-07-03 LAB — CBC
HCT: 52.1 % — ABNORMAL HIGH (ref 39.0–52.0)
Hemoglobin: 16.2 g/dL (ref 13.0–17.0)
MCH: 33.2 pg (ref 26.0–34.0)
MCHC: 31.1 g/dL (ref 30.0–36.0)
MCV: 106.8 fL — ABNORMAL HIGH (ref 78.0–100.0)
Platelets: 232 10*3/uL (ref 150–400)
RBC: 4.88 MIL/uL (ref 4.22–5.81)
RDW: 14.7 % (ref 11.5–15.5)
WBC: 5.5 10*3/uL (ref 4.0–10.5)

## 2017-07-03 LAB — MRSA PCR SCREENING: MRSA by PCR: NEGATIVE

## 2017-07-03 LAB — TROPONIN I
Troponin I: <0.03 ng/mL (ref <0.03)
Troponin I: <0.03 ng/mL (ref <0.03)

## 2017-07-03 LAB — ECHOCARDIOGRAM COMPLETE
Height: 71 in
Weight: 3721.36 oz

## 2017-07-03 MED ORDER — ALBUTEROL SULFATE (2.5 MG/3ML) 0.083% IN NEBU: 2.5000 mg | INHALATION_SOLUTION | Freq: Four times a day (QID) | RESPIRATORY_TRACT | Status: DC | PRN

## 2017-07-03 MED ORDER — CHLORHEXIDINE GLUCONATE 0.12 % MT SOLN
15.0000 mL | Freq: Two times a day (BID) | OROMUCOSAL | Status: DC
  Administered 2017-07-04: 15 mL via OROMUCOSAL
  Filled 2017-07-03: qty 15

## 2017-07-03 MED ORDER — ORAL CARE MOUTH RINSE: 15.0000 mL | Freq: Two times a day (BID) | OROMUCOSAL | Status: DC

## 2017-07-03 MED ORDER — FUROSEMIDE 10 MG/ML IJ SOLN
40.0000 mg | Freq: Every day | INTRAMUSCULAR | Status: DC
  Administered 2017-07-03 – 2017-07-04 (×2): 40 mg via INTRAVENOUS
  Filled 2017-07-03 (×2): qty 4

## 2017-07-03 NOTE — Progress Notes (Signed)
  Echocardiogram 2D Echocardiogram has been performed.  Louis [name] 07/03/2017, 11:54 AM

## 2017-07-03 NOTE — Progress Notes (Signed)
Walked pt from bed to bathroom on 4LNC. Pt dropped sats to 87% but recovered quickly after sitting

## 2017-07-03 NOTE — Progress Notes (Signed)
PROGRESS NOTE    Louis [name]  TKW:[id number] DOB: Nov 22, 1955 DOA: 07/02/2017 PCP: Ria Bush, MD     Brief Narrative:  Louis [name] is a 62 y.o. male with medical history significant of HTN, asthma, obesity, and thoracic scoliosis; who presented with complaints of quick breathing over the last 5 days.  Patient had recently taken a trip to [place] in [place] 1 week ago, and while there had developed what he describes as quick breathing.  He does not necessarily feel short of breath.   Symptoms worsened with activity and laying on his back.  He noted symptoms seem to improve while laying on his right side.  Associated symptoms include complaints of a mild headache, leg swelling, and fatigue.  Denies any significant fever, chills, cough, wheezing, chest pain, nausea, vomiting, abdominal pain, dysuria, calf pain, or recent sick contacts to his knowledge.  He gone to his PCP today due to symptoms and was seen to be hypoxic on room air and sent to the emergency department for further evaluation.  He is not on oxygen at baseline and does not wear CPAP mask to sleep at night.    New events last 24 hours / Subjective: Doing well but remains on O2. Denies any shortness of breath but describes it as a shallow tachypnea. Denies cough, denies chest pain. He denies any hx of smoking, and is not around any second hand smoke.   Assessment & Plan:   Principal Problem:   Acute respiratory failure with hypoxia and hypercapnia (HCC) Active Problems:   Obesity, Class I, BMI 30-34.9   Mild intermittent asthma in adult without complication   HTN (hypertension)   Cardiomegaly   Community acquired pneumonia   Acute hypoxemic and hypercapnic respiratory failure -CTA negative for PE, did show early pneumonia bilateral lower lung bases -Requires 4L with ambulation, continue to wean as able, may need home O2 if cannot wean off  Acute diastolic HF -Echo revealed normal EF but did show grade 2 diastolic  dysfunction. He does not appear grossly fluid overloaded although did have some trace pedal edema -Trial IV lasix  Doubt CAP -He has no symptoms of cough, fever, leukocytosis -Will stop rocephin/axithromax  Possible OSA -He states that his wife has told him he stops breathing at night. He also has large circumference neck. Recommend outpatient sleep study to evaluate OSA  Obesity -Body mass index is 32.44 kg/m.   DVT prophylaxis: Lovenox Code Status: Full Family Communication: No family at bedside Disposition Plan: Pending improvement in O2   Consultants:   None  Procedures:   None   Antimicrobials:  Anti-infectives (From admission, onward)   Start     Dose/Rate Route Frequency Ordered Stop   07/03/17 1800  cefTRIAXone (ROCEPHIN) 1 g in sodium chloride 0.9 % 100 mL IVPB     1 g 200 mL/hr over 30 Minutes Intravenous Every 24 hours 07/02/17 2009 07/09/17 1759   07/03/17 1800  azithromycin (ZITHROMAX) 500 mg in sodium chloride 0.9 % 250 mL IVPB     500 mg 250 mL/hr over 60 Minutes Intravenous Every 24 hours 07/02/17 2009 07/09/17 1759   07/02/17 1930  cefTRIAXone (ROCEPHIN) 1 g in sodium chloride 0.9 % 100 mL IVPB     1 g 200 mL/hr over 30 Minutes Intravenous  Once 07/02/17 1922 07/02/17 2116   07/02/17 1930  azithromycin (ZITHROMAX) 500 mg in sodium chloride 0.9 % 250 mL IVPB     500 mg 250 mL/hr over 60 Minutes  Intravenous  Once 07/02/17 1922 07/02/17 2321        Objective: Vitals:   07/03/17 1300 07/03/17 1400 07/03/17 1445 07/03/17 1600  BP:  103/67    Pulse: 62 64 64   Resp: (!) 26 (!) 24 18   Temp:    99.2 F (37.3 C)  TempSrc:    Oral  SpO2: 94% 93% 95%   Weight:      Height:        Intake/Output Summary (Last 24 hours) at 07/03/2017 1656 Last data filed at 07/03/2017 1300 Gross per 24 hour  Intake 720 ml  Output 454 ml  Net 266 ml   Filed Weights   07/02/17 1644 07/02/17 2248 07/03/17 0355  Weight: 108.4 kg (239 lb) 105.5 kg (232 lb 9.4 oz)  105.5 kg (232 lb 9.4 oz)    Examination:  General exam: Appears calm and comfortable  Respiratory system: Diminished breath sounds without crackles or wheeze. Respiratory effort normal. No conversational dyspnea  Cardiovascular system: S1 & S2 heard, RRR. No JVD, murmurs, rubs, gallops or clicks. Trace pedal edema. Gastrointestinal system: Abdomen is nondistended, soft and nontender. No organomegaly or masses felt. Normal bowel sounds heard. Central nervous system: Alert and oriented. No focal neurological deficits. Extremities: Symmetric 5 x 5 power. Skin: No rashes, lesions or ulcers Psychiatry: Judgement and insight appear normal. Mood & affect appropriate.   Data Reviewed: I have personally reviewed following labs and imaging studies  CBC: Recent Labs  Lab 07/02/17 1648 07/03/17 0400  WBC 6.5 5.5  NEUTROABS 4.2  --   HGB 17.3* 16.2  HCT 54.8* 52.1*  MCV 105.0* 106.8*  PLT 222 785   Basic Metabolic Panel: Recent Labs  Lab 07/02/17 1648 07/03/17 0400  NA 137 139  K 4.8 4.5  CL 96* 96*  CO2 33* 35*  GLUCOSE 112* 110*  BUN 22* 19  CREATININE 1.02 1.04  CALCIUM 9.5 9.2   GFR: Estimated Creatinine Clearance: 93.4 mL/min (by C-G formula based on SCr of 1.04 mg/dL). Liver Function Tests: Recent Labs  Lab 07/02/17 1648  AST 22  ALT 38  ALKPHOS 82  BILITOT 0.7  PROT 7.5  ALBUMIN 3.9   No results for input(s): LIPASE, AMYLASE in the last 168 hours. No results for input(s): AMMONIA in the last 168 hours. Coagulation Profile: No results for input(s): INR, PROTIME in the last 168 hours. Cardiac Enzymes: Recent Labs  Lab 07/02/17 1648 07/02/17 2136 07/03/17 0400 07/03/17 0956  TROPONINI <0.03 <0.03 <0.03 <0.03   BNP (last 3 results) No results for input(s): PROBNP in the last 8760 hours. HbA1C: No results for input(s): HGBA1C in the last 72 hours. CBG: No results for input(s): GLUCAP in the last 168 hours. Lipid Profile: No results for input(s): CHOL,  HDL, LDLCALC, TRIG, CHOLHDL, LDLDIRECT in the last 72 hours. Thyroid Function Tests: Recent Labs    07/02/17 2230  TSH 0.940   Anemia Panel: No results for input(s): VITAMINB12, FOLATE, FERRITIN, TIBC, IRON, RETICCTPCT in the last 72 hours. Sepsis Labs: Recent Labs  Lab 07/02/17 1651  LATICACIDVEN 0.86    Recent Results (from the past 240 hour(s))  Culture, blood (Routine X 2) w Reflex to ID Panel     Status: None (Preliminary result)   Collection Time: 07/02/17  5:47 PM  Result Value Ref Range Status   Specimen Description   Final    BLOOD LEFT HAND Performed at [facility] [address] [place], [place] [address]  Special Requests   Final    BOTTLES DRAWN AEROBIC AND ANAEROBIC Blood Culture adequate volume Performed at [facility] [address], [place], [place] [address]    Culture   Final    NO GROWTH < 24 HOURS Performed at [facility] [address] [place], [place] [address]    Report Status PENDING  Incomplete  Culture, blood (Routine X 2) w Reflex to ID Panel     Status: None (Preliminary result)   Collection Time: 07/02/17  7:01 PM  Result Value Ref Range Status   Specimen Description   Final    BLOOD LEFT FOREARM Performed at [facility] [address] [place], [place] [address]    Special Requests   Final    BOTTLES DRAWN AEROBIC AND ANAEROBIC Blood Culture adequate volume Performed at [facility] [address] [place], [place] [address]    Culture   Final    NO GROWTH < 24 HOURS Performed at [facility] [address] [place], [place] [address]    Report Status PENDING  Incomplete  MRSA PCR Screening     Status: None   Collection Time: 07/02/17 10:30 PM  Result Value Ref Range Status   MRSA by PCR NEGATIVE NEGATIVE Final    Comment:        The GeneXpert MRSA Assay (FDA approved for NASAL specimens only), is one component of a comprehensive  MRSA colonization surveillance program. It is not intended to diagnose MRSA infection nor to guide or monitor treatment for MRSA infections. Performed at [facility], [place] [address] [place], [place] [address]        Radiology Studies: Dg Chest 2 View  Result Date: 07/02/2017 CLINICAL DATA:  Shortness of breath for the last week, no chest pain, history of asthma EXAM: CHEST - 2 VIEW COMPARISON:  None. FINDINGS: There is recommend opacity medially at the left lung base with apparent air bronchograms, most consistent with patchy area of pneumonia. Some atelectasis is noted at the lung bases. There is cardiomegaly present and a degree of mild pulmonary vascular congestion cannot be excluded. However no definite pleural effusion is seen. No acute bony abnormality seen with thoracic scoliosis noted. IMPRESSION: 1. Opacity medially at the left lung base suspicious for pneumonia. Recommend follow-up. 2. Cardiomegaly.  Cannot exclude mild pulmonary vascular congestion. 3. Thoracolumbar scoliosis. Electronically Signed   By: Ivar Drape M.D.   On: 07/02/2017 16:01   Ct Angio Chest Pe W And/or Wo Contrast  Result Date: 07/02/2017 CLINICAL DATA:  Shortness of breath EXAM: CT ANGIOGRAPHY CHEST WITH CONTRAST TECHNIQUE: Multidetector CT imaging of the chest was performed using the standard protocol during bolus administration of intravenous contrast. Multiplanar CT image reconstructions and MIPs were obtained to evaluate the vascular anatomy. CONTRAST:  1102mL ISOVUE-370 IOPAMIDOL (ISOVUE-370) INJECTION 76% COMPARISON:  Chest radiograph July 02, 2017 FINDINGS: Cardiovascular: There is no demonstrable pulmonary embolus. There is no appreciable thoracic aortic aneurysm or dissection. There is aortic atherosclerosis. Visualized great vessels appear unremarkable except for scattered foci calcification, not causing hemodynamically significant obstruction. There is no pericardial effusion or  pericardial thickening. The main pulmonary outflow tract measures 3.5 cm in diameter, enlarged. Mediastinum/Nodes: Thyroid appears unremarkable. There is no appreciable thoracic adenopathy. No esophageal lesions are evident. Lungs/Pleura: There is bibasilar atelectasis. There is mild consolidation in each lung base, concerning for early pneumonia superimposed on atelectatic change. No pleural effusion or pleural thickening  evident. Upper Abdomen: There is hepatic steatosis. There is a cyst arising from the upper pole of the left kidney measuring 4.2 x 4.0 cm. Visualized upper abdominal structures otherwise appear unremarkable. Musculoskeletal: There is marked lower thoracic dextroscoliosis. There old healed rib fractures on the left. No blastic or lytic bone lesions. No evident chest wall lesion. Review of the MIP images confirms the above findings. IMPRESSION: 1. No demonstrable pulmonary embolus. No thoracic aortic aneurysm or dissection. There is aortic and great vessel atherosclerosis. 2. Prominence of the main pulmonary outflow tract, a finding felt to be indicative of pulmonary arterial hypertension. 3. Lower lobe atelectatic change. Suspect early pneumonia in each posterior lung base. 4.  No evident thoracic adenopathy. 5.  Hepatic steatosis. Aortic Atherosclerosis (ICD10-I70.0). Electronically Signed   By: Lowella Grip III M.D.   On: 07/02/2017 18:55      Scheduled Meds: . amLODipine  2.5 mg Oral Daily  . enoxaparin (LOVENOX) injection  40 mg Subcutaneous QHS  . fluorometholone  1 drop Both Eyes QID  . guaiFENesin  600 mg Oral BID  . ipratropium-albuterol  3 mL Nebulization TID  . losartan  100 mg Oral Daily  . metoprolol succinate  150 mg Oral Daily  . sodium chloride flush  3 mL Intravenous Q12H   Continuous Infusions: . azithromycin    . cefTRIAXone (ROCEPHIN)  IV       LOS: 1 day    Time spent: 40 minutes   Dessa Phi, DO [facility] [email or web address] Password  [facility] 07/03/2017, 4:56 PM

## 2017-07-04 LAB — CBC
HCT: 52.6 % — ABNORMAL HIGH (ref 39.0–52.0)
Hemoglobin: 16.5 g/dL (ref 13.0–17.0)
MCH: 32.9 pg (ref 26.0–34.0)
MCHC: 31.4 g/dL (ref 30.0–36.0)
MCV: 104.8 fL — ABNORMAL HIGH (ref 78.0–100.0)
Platelets: 237 10*3/uL (ref 150–400)
RBC: 5.02 MIL/uL (ref 4.22–5.81)
RDW: 14.5 % (ref 11.5–15.5)
WBC: 7.2 10*3/uL (ref 4.0–10.5)

## 2017-07-04 LAB — BASIC METABOLIC PANEL
Anion gap: 11 (ref 5–15)
BUN: 18 mg/dL (ref 6–20)
CO2: 33 mmol/L — ABNORMAL HIGH (ref 22–32)
Calcium: 8.9 mg/dL (ref 8.9–10.3)
Chloride: 92 mmol/L — ABNORMAL LOW (ref 98–111)
Creatinine, Ser: 0.86 mg/dL (ref 0.61–1.24)
GFR calc Af Amer: >60 mL/min (ref >60)
GFR calc non Af Amer: >60 mL/min (ref >60)
Glucose, Bld: 105 mg/dL — ABNORMAL HIGH (ref 70–99)
Potassium: 4.1 mmol/L (ref 3.5–5.1)
Sodium: 136 mmol/L (ref 135–145)

## 2017-07-04 MED ORDER — FUROSEMIDE 20 MG PO TABS: 20.0000 mg | ORAL_TABLET | Freq: Every day | ORAL | 0 refills | Status: DC

## 2017-07-04 NOTE — Care Management Note (Signed)
Case Management Note  Patient Details  Name: Louis [name] MRN: [id number] Date of Birth: Jun 17, 1955  Subjective/Objective:                  Patient with need for home o2  Action/Plan: Home 02 [facility] through [facility].  Representative Santiago Glad is aware.  Expected Discharge Date:  07/04/17               Expected Discharge Plan:  [facility]  In-House Referral:     Discharge planning Services  CM Consult  Post Acute Care Choice:  Durable Medical Equipment Choice offered to:  Patient  DME Arranged:  Oxygen DME Agency:  [place]:    [facility] Agency:     Status of Service:  Completed, signed off  If discussed at [place] of Stay Meetings, dates discussed:    Additional Comments:  Leeroy Cha, RN 07/04/2017, 10:23 AM

## 2017-07-04 NOTE — Progress Notes (Signed)
Pt and wife given discharge instruction with understanding. No questions at this time. Monitor and iv d/c. Pt has home oxygen tanks at bedside. Pt wheeled out via wheelchair by staff.

## 2017-07-04 NOTE — Progress Notes (Signed)
SATURATION QUALIFICATIONS: (This note is used to comply with regulatory documentation for home oxygen)  Patient Saturations on Room Air at Rest = 94%  Patient Saturations on Room Air while Ambulating = 87%  Patient Saturations on 4 Liters of oxygen while Ambulating =93%  Please briefly explain why patient needs home oxygen: Pt's oxygen saturations decrease(see values above) while ambulating for less than 1 minute.

## 2017-07-04 NOTE — Progress Notes (Signed)
Pt. set up with [employer] BiPAP initially at I-8/E-4 with 4 lpm oxygen added to circuit due to pt. c/o initial pressure being to high when at I-12/E-6, RT observed pt. with multiple oxygen desaturations down to 87% and noted snoring once pt. asleep with pts respiratory rate dropping to 16, RT adjusted setting to >I-16/<E-10 with 8lpm oxygen added into circuit to keep sats >92% per order, has sleep study ordered, RN made aware, RT to monitor pt.

## 2017-07-04 NOTE — Discharge Summary (Signed)
Physician Discharge Summary  Louis [name] KCM:[id number] DOB: 21-Feb-1955 DOA: 07/02/2017  PCP: Ria Bush, MD  Admit date: 07/02/2017 Discharge date: 07/04/2017  Admitted From: Home Disposition:  Home   Recommendations for Outpatient Follow-up:  1. Follow up with PCP in 1 week 2. Recommend outpatient sleep study, possibly PFT as well  3. Check BMP as outpatient as patient started on lasix in hospital   Home Health: None  Equipment/Devices: Home O2, wean as tolerated    Discharge Condition: Stable CODE STATUS: Full  Diet recommendation: Heart healthy   Brief/Interim Summary: Louis Derossett Smithis a 62 y.o.malewith medical history significant ofHTN, asthma, obesity, and thoracic scoliosis;who presented with complaints of quick breathing over the last 5 days. Patient had recently taken a trip to [place] in [place] 1 week ago,and while there had developed what he describes as quick breathing.He does not necessarily feel short of breath. Symptoms worsened with activity and laying on his back. He noted symptoms seem to improve while laying on his right side. Associated symptoms include complaints of a mild headache, leg swelling, and fatigue. Denies any significant fever, chills, cough, wheezing, chest pain, nausea, vomiting, abdominal pain, dysuria, calf pain, or recent sick contacts to his knowledge. He gone to his PCP today due to symptoms and was seen to be hypoxic on room air and sent to the emergency department for further evaluation. He is not on oxygen at baseline and does not wear CPAP mask to sleep at night.   Patient underwent CTA chest which was negative for PE.  It showed early pneumonia versus atelectasis in the bilateral lower lung bases and patient was started on empiric Rocephin and Zithromax.  He required nasal cannula O2 during his hospitalization.  Echocardiogram revealed EF 05-69%, grade 2 diastolic dysfunction.  Main pulmonary artery was normal size, systolic pressure  was in the normal range.  Patient was started on IV Lasix, deescalated to p.o. Lasix on discharge.  He did require home O2, this was set up by case management prior to discharge home.  He will follow-up with his primary care physician.  We discussed the importance of sleep study and possibly needing CPAP at nighttime.  He also inquired about getting a pulmonary function test, this can be arranged as an outpatient as well.  Due to lack of clinical symptoms of pneumonia (he remained afebrile without leukocytosis or productive cough), antibiotic was discontinued.  Discharge Diagnoses:  Principal Problem:   Acute respiratory failure with hypoxia and hypercapnia (HCC) Active Problems:   Obesity, Class I, BMI 30-34.9   Mild intermittent asthma in adult without complication   HTN (hypertension)   Cardiomegaly   Community acquired pneumonia   Acute hypoxemic and hypercapnic respiratory failure -CTA negative for PE, did show early pneumonia bilateral lower lung bases -Met criteria for home O2, set up by case management prior to discharge home. Continue to wean.   Acute diastolic HF -Echo revealed normal EF but did show grade 2 diastolic dysfunction. He does not appear grossly fluid overloaded although did have some trace pedal edema -Started on lasix in hospital, will discharge home on PO lasix   Doubt CAP -He has no symptoms of cough, fever, leukocytosis -Will stop rocephin/axithromax  Possible OSA -He states that his wife has told him he stops breathing at night. He also has large circumference neck. Recommend outpatient sleep study to evaluate OSA  Obesity -Body mass index is 32.44 kg/m.    Discharge Instructions  Discharge Instructions    (HEART  FAILURE PATIENTS) Call MD:  Anytime you have any of the following symptoms: 1) 3 pound weight gain in 24 hours or 5 pounds in 1 week 2) shortness of breath, with or without a dry hacking cough 3) swelling in the hands, feet or stomach 4)  if you have to sleep on extra pillows at night in order to breathe.   Complete by:  As directed    Call MD for:  difficulty breathing, headache or visual disturbances   Complete by:  As directed    Call MD for:  extreme fatigue   Complete by:  As directed    Call MD for:  persistant dizziness or light-headedness   Complete by:  As directed    Call MD for:  persistant nausea and vomiting   Complete by:  As directed    Call MD for:  severe uncontrolled pain   Complete by:  As directed    Call MD for:  temperature >100.4   Complete by:  As directed    Diet - low sodium heart healthy   Complete by:  As directed    Discharge instructions   Complete by:  As directed    You were cared for by a hospitalist during your hospital stay. If you have any questions about your discharge medications or the care you received while you were in the hospital after you are discharged, you can call the unit and ask to speak with the hospitalist on call if the hospitalist that took care of you is not available. Once you are discharged, your primary care physician will handle any further medical issues. Please note that NO REFILLS for any discharge medications will be authorized once you are discharged, as it is imperative that you return to your primary care physician (or establish a relationship with a primary care physician if you do not have one) for your aftercare needs so that they can reassess your need for medications and monitor your lab values.   Increase activity slowly   Complete by:  As directed      Allergies as of 07/04/2017      Reactions   Accupril [quinapril Hcl] Cough      Medication List    TAKE these medications   albuterol 108 (90 Base) MCG/ACT inhaler Commonly known as:  PROVENTIL HFA;VENTOLIN HFA Inhale 2 puffs into the lungs every 4 (four) hours as needed for wheezing or shortness of breath.   ALPRAZolam 0.5 MG tablet Commonly known as:  XANAX Take 1 tablet (0.5 mg total) by mouth  at bedtime as needed for sleep.   amLODipine 5 MG tablet Commonly known as:  NORVASC Take 2.5 mg by mouth daily.   fluorometholone 0.1 % ophthalmic suspension Commonly known as:  FML Place 1 drop into both eyes 4 (four) times daily. Shake liquid before administering drops   furosemide 20 MG tablet Commonly known as:  LASIX Take 1 tablet (20 mg total) by mouth daily. Start taking on:  07/05/2017   losartan 100 MG tablet Commonly known as:  COZAAR TAKE ONE TABLET BY MOUTH ONE TIME DAILY   LOTEMAX 0.5 % Gel Generic drug:  Loteprednol Etabonate Place 1 drop into both eyes daily as needed (dry eyes).   metoprolol succinate 50 MG 24 hr tablet Commonly known as:  TOPROL-XL Take 3 tablets (150 mg total) by mouth daily.   urea 40 % Crea Commonly known as:  CARMOL apply to affected area twice a day for THICKEST AREAS if  needed for dry skin   Vitamin D (Ergocalciferol) 50000 units Caps capsule Commonly known as:  DRISDOL Take 50,000 Units by mouth every 7 (seven) days.            Durable Medical Equipment  (From admission, onward)        Start     Ordered   07/04/17 1018  For home use only DME Pulse oximeter  Once     07/04/17 1017   07/04/17 1017  For home use only DME oxygen  Once    Question Answer Comment  Mode or (Route) Nasal cannula   Liters per Minute 4   Frequency Continuous (stationary and portable oxygen unit needed)   Oxygen delivery system Gas      07/04/17 1017     Follow-up Information    Ria Bush, MD. Schedule an appointment as soon as possible for a visit in 1 week(s).   Specialty:  Family Medicine Contact information: [place] [address] [phone]          Allergies  Allergen Reactions  . Accupril [Quinapril Hcl] Cough    Consultations:  None   Procedures/Studies: Dg Chest 2 View  Result Date: 07/02/2017 CLINICAL DATA:  Shortness of breath for the last week, no chest pain, history of asthma  EXAM: CHEST - 2 VIEW COMPARISON:  None. FINDINGS: There is recommend opacity medially at the left lung base with apparent air bronchograms, most consistent with patchy area of pneumonia. Some atelectasis is noted at the lung bases. There is cardiomegaly present and a degree of mild pulmonary vascular congestion cannot be excluded. However no definite pleural effusion is seen. No acute bony abnormality seen with thoracic scoliosis noted. IMPRESSION: 1. Opacity medially at the left lung base suspicious for pneumonia. Recommend follow-up. 2. Cardiomegaly.  Cannot exclude mild pulmonary vascular congestion. 3. Thoracolumbar scoliosis. Electronically Signed   By: Ivar Drape M.D.   On: 07/02/2017 16:01   Ct Angio Chest Pe W And/or Wo Contrast  Result Date: 07/02/2017 CLINICAL DATA:  Shortness of breath EXAM: CT ANGIOGRAPHY CHEST WITH CONTRAST TECHNIQUE: Multidetector CT imaging of the chest was performed using the standard protocol during bolus administration of intravenous contrast. Multiplanar CT image reconstructions and MIPs were obtained to evaluate the vascular anatomy. CONTRAST:  190m ISOVUE-370 IOPAMIDOL (ISOVUE-370) INJECTION 76% COMPARISON:  Chest radiograph July 02, 2017 FINDINGS: Cardiovascular: There is no demonstrable pulmonary embolus. There is no appreciable thoracic aortic aneurysm or dissection. There is aortic atherosclerosis. Visualized great vessels appear unremarkable except for scattered foci calcification, not causing hemodynamically significant obstruction. There is no pericardial effusion or pericardial thickening. The main pulmonary outflow tract measures 3.5 cm in diameter, enlarged. Mediastinum/Nodes: Thyroid appears unremarkable. There is no appreciable thoracic adenopathy. No esophageal lesions are evident. Lungs/Pleura: There is bibasilar atelectasis. There is mild consolidation in each lung base, concerning for early pneumonia superimposed on atelectatic change. No pleural effusion or  pleural thickening evident. Upper Abdomen: There is hepatic steatosis. There is a cyst arising from the upper pole of the left kidney measuring 4.2 x 4.0 cm. Visualized upper abdominal structures otherwise appear unremarkable. Musculoskeletal: There is marked lower thoracic dextroscoliosis. There old healed rib fractures on the left. No blastic or lytic bone lesions. No evident chest wall lesion. Review of the MIP images confirms the above findings. IMPRESSION: 1. No demonstrable pulmonary embolus. No thoracic aortic aneurysm or dissection. There is aortic and great vessel atherosclerosis. 2. Prominence of the main pulmonary outflow  tract, a finding felt to be indicative of pulmonary arterial hypertension. 3. Lower lobe atelectatic change. Suspect early pneumonia in each posterior lung base. 4.  No evident thoracic adenopathy. 5.  Hepatic steatosis. Aortic Atherosclerosis (ICD10-I70.0). Electronically Signed   By: Lowella Grip III M.D.   On: 07/02/2017 18:55    Echo  Study Conclusions  - Left ventricle: The cavity size was normal. There was mild   concentric hypertrophy. Systolic function was normal. The   estimated ejection fraction was in the range of 60% to 65%. Wall   motion was normal; there were no regional wall motion   abnormalities. Features are consistent with a pseudonormal left   ventricular filling pattern, with concomitant abnormal relaxation   and increased filling pressure (grade 2 diastolic dysfunction).   There was no evidence of elevated ventricular filling pressure by   Doppler parameters. - Aortic valve: There was no regurgitation. - Mitral valve: There was no regurgitation. - Left atrium: The atrium was moderately dilated. - Right ventricle: The cavity size was moderately dilated. Wall   thickness was normal. Systolic function was mildly reduced. - Right atrium: The atrium was normal in size. - Pulmonic valve: There was trivial regurgitation. - Inferior vena cava:  The vessel was normal in size. - Pericardium, extracardiac: A trivial pericardial effusion was   identified posterior to the heart. Features were not consistent  with tamponade physiology.    Discharge Exam: Vitals:   07/04/17 0800 07/04/17 0801  BP: 113/79   Pulse: 66   Resp: (!) 24   Temp:  98.5 F (36.9 C)  SpO2: 96% 94%    General: Pt is alert, awake, not in acute distress Cardiovascular: RRR, S1/S2 +, no rubs, no gallops Respiratory: Diminished breath sounds, no wheezing, no rhonchi, no distress and no conversational dyspnea  Abdominal: Soft, NT, ND, bowel sounds + Extremities: Trace ankle edema, no cyanosis    The results of significant diagnostics from this hospitalization (including imaging, microbiology, ancillary and laboratory) are listed below for reference.     Microbiology: Recent Results (from the past 240 hour(s))  Culture, blood (Routine X 2) w Reflex to ID Panel     Status: None (Preliminary result)   Collection Time: 07/02/17  5:47 PM  Result Value Ref Range Status   Specimen Description BLOOD LEFT HAND  Final   Special Requests   Final    BOTTLES DRAWN AEROBIC AND ANAEROBIC Blood Culture adequate volume Performed at [facility] [address] [place], [place] [address]    Culture   Final    NO GROWTH < 24 HOURS Performed at [facility] [address] [place], [place] [address]    Report Status PENDING  Incomplete  Culture, blood (Routine X 2) w Reflex to ID Panel     Status: None (Preliminary result)   Collection Time: 07/02/17  7:01 PM  Result Value Ref Range Status   Specimen Description   Final    BLOOD LEFT FOREARM Performed at [facility] [address], [place], [place] [address]    Special Requests   Final    BOTTLES DRAWN AEROBIC AND ANAEROBIC Blood Culture adequate volume Performed at [facility] [address] [place], [place] [address]    Culture   Final     NO GROWTH < 24 HOURS Performed at [facility] [address], [place], [place] [address]    Report Status PENDING  Incomplete  MRSA PCR Screening     Status: None   Collection Time: 07/02/17 10:30 PM  Result Value Ref Range Status   MRSA by PCR NEGATIVE NEGATIVE Final    Comment:        The GeneXpert MRSA Assay (FDA approved for NASAL specimens only), is one component of a comprehensive MRSA colonization surveillance program. It is not intended to diagnose MRSA infection nor to guide or monitor treatment for MRSA infections. Performed at [facility], [place] [address], [place], [place] [address]      Labs: BNP (last 3 results) Recent Labs    07/02/17 1648  BNP 892.1*   Basic Metabolic Panel: Recent Labs  Lab 07/02/17 1648 07/03/17 0400 07/04/17 0336  NA 137 139 136  K 4.8 4.5 4.1  CL 96* 96* 92*  CO2 33* 35* 33*  GLUCOSE 112* 110* 105*  BUN 22* 19 18  CREATININE 1.02 1.04 0.86  CALCIUM 9.5 9.2 8.9   Liver Function Tests: Recent Labs  Lab 07/02/17 1648  AST 22  ALT 38  ALKPHOS 82  BILITOT 0.7  PROT 7.5  ALBUMIN 3.9   No results for input(s): LIPASE, AMYLASE in the last 168 hours. No results for input(s): AMMONIA in the last 168 hours. CBC: Recent Labs  Lab 07/02/17 1648 07/03/17 0400 07/04/17 0336  WBC 6.5 5.5 7.2  NEUTROABS 4.2  --   --   HGB 17.3* 16.2 16.5  HCT 54.8* 52.1* 52.6*  MCV 105.0* 106.8* 104.8*  PLT 222 232 237   Cardiac Enzymes: Recent Labs  Lab 07/02/17 1648 07/02/17 2136 07/03/17 0400 07/03/17 0956  TROPONINI <0.03 <0.03 <0.03 <0.03   BNP: Invalid input(s): POCBNP CBG: No results for input(s): GLUCAP in the last 168 hours. D-Dimer No results for input(s): DDIMER in the last 72 hours. Hgb A1c No results for input(s): HGBA1C in the last 72 hours. Lipid Profile No results for input(s): CHOL, HDL, LDLCALC, TRIG, CHOLHDL, LDLDIRECT in the last 72 hours. Thyroid function studies Recent Labs     07/02/17 2230  TSH 0.940   Anemia work up No results for input(s): VITAMINB12, FOLATE, FERRITIN, TIBC, IRON, RETICCTPCT in the last 72 hours. Urinalysis    Component Value Date/Time   APPEARANCEUR Clear 02/27/2017 1555   GLUCOSEU Negative 02/27/2017 1555   BILIRUBINUR Negative 02/27/2017 1555   PROTEINUR Negative 02/27/2017 1555   NITRITE Negative 02/27/2017 1555   LEUKOCYTESUR Negative 02/27/2017 1555   Sepsis Labs Invalid input(s): PROCALCITONIN,  WBC,  LACTICIDVEN Microbiology Recent Results (from the past 240 hour(s))  Culture, blood (Routine X 2) w Reflex to ID Panel     Status: None (Preliminary result)   Collection Time: 07/02/17  5:47 PM  Result Value Ref Range Status   Specimen Description BLOOD LEFT HAND  Final   Special Requests   Final    BOTTLES DRAWN AEROBIC AND ANAEROBIC Blood Culture adequate volume Performed at [facility], [place] [address], [place],  [address]    Culture   Final    NO GROWTH < 24 HOURS Performed at [facility] [address] [place],  [address]    Report Status PENDING  Incomplete  Culture, blood (Routine X 2) w Reflex to ID Panel     Status: None (Preliminary result)   Collection Time: 07/02/17  7:01 PM  Result Value Ref Range Status   Specimen Description   Final    BLOOD LEFT FOREARM Performed at [facility],  [place] [address] [place], [place] [address]    Special Requests   Final    BOTTLES DRAWN AEROBIC AND ANAEROBIC Blood Culture adequate volume Performed at [facility] [address], [place], [place] [address]    Culture   Final    NO GROWTH < 24 HOURS Performed at [facility] [address] [place], [place] [address]    Report Status PENDING  Incomplete  MRSA PCR Screening     Status: None   Collection Time: 07/02/17 10:30 PM  Result Value Ref Range Status   MRSA by PCR NEGATIVE NEGATIVE Final    Comment:        The GeneXpert MRSA  Assay (FDA approved for NASAL specimens only), is one component of a comprehensive MRSA colonization surveillance program. It is not intended to diagnose MRSA infection nor to guide or monitor treatment for MRSA infections. Performed at [facility], [place] [address], [place], [place] [address]      Patient was seen and examined on the day of discharge and was found to be in stable condition. Time coordinating discharge: 35 minutes including assessment and coordination of care, as well as examination of the patient.   SIGNED:  Dessa Phi, DO [facility] Pager [phone]  If 7PM-7AM, please contact night-coverage [email or web address] Password [email or web address] 07/04/2017, 10:25 AM

## 2017-07-04 NOTE — Plan of Care (Signed)
  Problem: Education: Goal: Knowledge of General Education information will improve Outcome: Completed/Met   Problem: Clinical Measurements: Goal: Ability to maintain clinical measurements within normal limits will improve Outcome: Completed/Met Goal: Diagnostic test results will improve Outcome: Completed/Met Goal: Respiratory complications will improve Outcome: Completed/Met Note:  Home o2  Goal: Cardiovascular complication will be avoided Outcome: Completed/Met   Problem: Activity: Goal: Risk for activity intolerance will decrease Outcome: Completed/Met   Problem: Nutrition: Goal: Adequate nutrition will be maintained Outcome: Completed/Met   Problem: Coping: Goal: Level of anxiety will decrease Outcome: Completed/Met   Problem: Elimination: Goal: Will not experience complications related to bowel motility Outcome: Completed/Met Goal: Will not experience complications related to urinary retention Outcome: Completed/Met   Problem: Pain Managment: Goal: General experience of comfort will improve Outcome: Completed/Met   Problem: Safety: Goal: Ability to remain free from injury will improve Outcome: Completed/Met

## 2017-07-04 NOTE — Discharge Instructions (Signed)

## 2017-07-05 ENCOUNTER — Telehealth: Payer: Self-pay | Admitting: Family Medicine

## 2017-07-05 ENCOUNTER — Telehealth: Payer: Self-pay | Admitting: *Deleted

## 2017-07-05 NOTE — Telephone Encounter (Signed)
Left message on vm per dpr informing pt he will need to be seen by Dr. Darnell Level first and then Dr. Darnell Level will put in the referral.

## 2017-07-05 NOTE — Telephone Encounter (Signed)
Lm requesting return call to complete TCM and confirm hosp f/u appt  

## 2017-07-05 NOTE — Telephone Encounter (Signed)
Copied from [place] [id number]. Topic: Quick Communication - See Telephone Encounter >> Jul 05, 2017  9:38 AM Neva Seat wrote: Pt wants to speak w/ Dr. Synthia Innocent nurse regarding his sleep apnea testing and to get started on referral to a cardiologist. Pt did make an appt w/ Dr. Darnell Level on Mon 07/08/17.

## 2017-07-07 LAB — CULTURE, BLOOD (ROUTINE X 2)
Culture: NO GROWTH
Culture: NO GROWTH
Special Requests: ADEQUATE
Special Requests: ADEQUATE

## 2017-07-08 ENCOUNTER — Telehealth: Payer: Self-pay | Admitting: Cardiovascular Disease

## 2017-07-08 ENCOUNTER — Ambulatory Visit: Payer: 59 | Admitting: Family Medicine

## 2017-07-08 ENCOUNTER — Encounter: Payer: Self-pay | Admitting: Family Medicine

## 2017-07-08 VITALS — BP 120/64 | HR 75 | Temp 98.2°F | Ht 70.25 in | Wt 232.5 lb

## 2017-07-08 DIAGNOSIS — I5189 Other ill-defined heart diseases: Secondary | ICD-10-CM | POA: Diagnosis not present

## 2017-07-08 DIAGNOSIS — J9601 Acute respiratory failure with hypoxia: Secondary | ICD-10-CM

## 2017-07-08 DIAGNOSIS — J9602 Acute respiratory failure with hypercapnia: Secondary | ICD-10-CM | POA: Diagnosis not present

## 2017-07-08 DIAGNOSIS — J189 Pneumonia, unspecified organism: Secondary | ICD-10-CM

## 2017-07-08 DIAGNOSIS — M4124 Other idiopathic scoliosis, thoracic region: Secondary | ICD-10-CM | POA: Diagnosis not present

## 2017-07-08 DIAGNOSIS — G4733 Obstructive sleep apnea (adult) (pediatric): Secondary | ICD-10-CM

## 2017-07-08 LAB — BASIC METABOLIC PANEL
BUN: 16 mg/dL (ref 6–23)
CO2: 41 mEq/L — ABNORMAL HIGH (ref 19–32)
Calcium: 9.4 mg/dL (ref 8.4–10.5)
Chloride: 89 mEq/L — ABNORMAL LOW (ref 96–112)
Creatinine, Ser: 0.78 mg/dL (ref 0.40–1.50)
GFR: 107.9 mL/min (ref >60.00)
Glucose, Bld: 108 mg/dL — ABNORMAL HIGH (ref 70–99)
Potassium: 4.5 mEq/L (ref 3.5–5.1)
Sodium: 136 mEq/L (ref 135–145)

## 2017-07-08 MED ORDER — FUROSEMIDE 20 MG PO TABS: 20.0000 mg | ORAL_TABLET | Freq: Every day | ORAL | 3 refills | Status: DC

## 2017-07-08 NOTE — Assessment & Plan Note (Addendum)
Recent hospitalization showing mild polycythemia, CT showing pulmonary outflow tract prominence and CXR showing cardiomegaly. Anticipate restrictive lung disease from scoliosis and undiagnosed OSA contributing. Await pulm eval and sleep study.

## 2017-07-08 NOTE — Progress Notes (Signed)
BP 120/64 (BP Location: Left Arm, Patient Position: Sitting, Cuff Size: Normal)   Pulse 75   Temp 98.2 F (36.8 C) (Oral)   Ht 5' 10.25" (1.784 m)   Wt 232 lb 8 oz (105.5 kg)   SpO2 92% Comment: 3 L, continuous  BMI 33.12 kg/m    CC: hosp f/u visit  Subjective:    [name], male    DOB: 01-Sep-1955, 62 y.o.   MRN: [id number]  HPI: Louis [name] is a 62 y.o. male presenting on 07/08/2017 for Hospitalization Follow-up (Admitted to [facility] on 07/02/17, primary dx community aquiured pneumonia. Pt accompanied by wife. )   Recent hospitalization for acute hypoxic respiratory failure after trip to [place]. Hospital records reviewed. CTA negative for PE, showed possible early PNA vs atx - treated with rocephin and azithromycin. Supplemental oxygen was started. Echo showed grade 2 DD, EF 60-65%. Started on lasix, discharged with home O2 at [facility]. Clinically not consistent with pneumonia - so antibiotics were discontinued.   Known scoliosis.  Has been feeling well since he has been home on [facility].   Describes snoring, witnessed apnea (by wife), PNdyspnea during hospitalization. Denies daytime somnolence. Endorses restorative sleep.   Admit date: 07/02/2017 Discharge date: 07/04/2017 TCM f/u phone call attempted 07/05/2017  Recommendations for Outpatient Follow-up:  1. Follow up with PCP in 1 week 2. Recommend outpatient sleep study, possibly PFT as well  3. Check BMP as outpatient as patient started on lasix in hospital   Home Health: None  Equipment/Devices: Home O2, wean as tolerated    Discharge Condition: Stable CODE STATUS: Full  Diet recommendation: Heart healthy   Discharge Diagnoses:  Principal Problem:   Acute respiratory failure with hypoxia and hypercapnia (HCC) Active Problems:   Obesity, Class I, BMI 30-34.9   Mild intermittent asthma in adult without complication   HTN (hypertension)   Cardiomegaly   Community acquired pneumonia  Relevant past medical, surgical,  family and social history reviewed and updated as indicated. Interim medical history since our last visit reviewed. Allergies and medications reviewed and updated. Outpatient Medications Prior to Visit  Medication Sig Dispense Refill  . albuterol (PROVENTIL HFA;VENTOLIN HFA) 108 (90 BASE) MCG/ACT inhaler Inhale 2 puffs into the lungs every 4 (four) hours as needed for wheezing or shortness of breath.    . ALPRAZolam (XANAX) 0.5 MG tablet Take 1 tablet (0.5 mg total) by mouth at bedtime as needed for sleep. 30 tablet 0  . amLODipine (NORVASC) 5 MG tablet Take 2.5 mg by mouth daily.     . fluorometholone (FML) 0.1 % ophthalmic suspension Place 1 drop into both eyes 4 (four) times daily. Shake liquid before administering drops  0  . losartan (COZAAR) 100 MG tablet TAKE ONE TABLET BY MOUTH ONE TIME DAILY  90 tablet 2  . LOTEMAX 0.5 % GEL Place 1 drop into both eyes daily as needed (dry eyes).   0  . metoprolol succinate (TOPROL-XL) 50 MG 24 hr tablet Take 3 tablets (150 mg total) by mouth daily. 270 tablet 3  . urea (CARMOL) 40 % CREA apply to affected area twice a day for THICKEST AREAS if needed for dry skin  0  . Vitamin D, Ergocalciferol, (DRISDOL) 50000 UNITS CAPS capsule Take 50,000 Units by mouth every 7 (seven) days.    . furosemide (LASIX) 20 MG tablet Take 1 tablet (20 mg total) by mouth daily. (Patient taking differently: Take 20 mg by mouth daily. Takes 2 tablets  daily) 30 tablet 0   No facility-administered medications prior to visit.      Per HPI unless specifically indicated in ROS section below Review of Systems     Objective:    BP 120/64 (BP Location: Left Arm, Patient Position: Sitting, Cuff Size: Normal)   Pulse 75   Temp 98.2 F (36.8 C) (Oral)   Ht 5' 10.25" (1.784 m)   Wt 232 lb 8 oz (105.5 kg)   SpO2 92% Comment: 3 L, continuous  BMI 33.12 kg/m   Wt Readings from Last 3 Encounters:  07/08/17 232 lb 8 oz (105.5 kg)  07/04/17 233 lb 14.5 oz (106.1 kg)  07/02/17  239 lb (108.4 kg)    Physical Exam  Constitutional: He appears well-developed and well-nourished. No distress.  [place] in place  HENT:  Mouth/Throat: Oropharynx is clear and moist. No oropharyngeal exudate.  Eyes: Right conjunctiva is injected. Left conjunctiva is injected.  Cardiovascular: Normal rate, regular rhythm and normal heart sounds.  No murmur heard. Pulmonary/Chest: Effort normal and breath sounds normal. No respiratory distress. He has no wheezes. He has no rales.  Musculoskeletal: He exhibits no edema.  Psychiatric: He has a normal mood and affect.  Nursing note and vitals reviewed.     Assessment & Plan:   Problem List Items Addressed This Visit    Scoliosis    Known thoracolumbar scoliosis - may be contributing to restrictive lung disease. Await pulm eval.      OSA (obstructive sleep apnea)    Suspected - awaiting pulm eval.      Relevant Orders   Ambulatory referral to Pulmonology   Diastolic dysfunction    Will refer to cardiology for further evaluation. Anticipate pulmonary eval will take precedence.  Continue lasix 20-40mg  daily - discussed trying 20mg  dose and if good UOP with this, may continue 20mg  daily. Check BMP today.       Relevant Orders   Ambulatory referral to Cardiology   Basic metabolic panel   RESOLVED: Community acquired pneumonia    Not thought consistent with this - antibiotics were discontinued prior to discharge.       Acute respiratory failure with hypoxia and hypercapnia (HCC) - Primary    Recent hospitalization showing mild polycythemia, CT showing pulmonary outflow tract prominence and CXR showing cardiomegaly. Anticipate restrictive lung disease from scoliosis and undiagnosed OSA contributing. Await pulm eval and sleep study.       Relevant Orders   Ambulatory referral to Cardiology   Ambulatory referral to Pulmonology   Basic metabolic panel       Meds ordered this encounter  Medications  . furosemide (LASIX) 20 MG tablet      Sig: Take 1-2 tablets (20-40 mg total) by mouth daily.    Dispense:  60 tablet    Refill:  3   Orders Placed This Encounter  Procedures  . Basic metabolic panel  . Ambulatory referral to Cardiology    Referral Priority:   Routine    Referral Type:   Consultation    Referral Reason:   Specialty Services Required    Requested Specialty:   Cardiology    Number of Visits Requested:   1  . Ambulatory referral to Pulmonology    Referral Priority:   Urgent    Referral Type:   Consultation    Referral Reason:   Specialty Services Required    Requested Specialty:   Pulmonary Disease    Number of Visits Requested:   1  Follow up plan: No follow-ups on file.  Ria Bush, MD

## 2017-07-08 NOTE — Patient Instructions (Addendum)
See our referral coordinators to schedule lung and heart doctor evaluations.  Labs today Continue current treatment.

## 2017-07-08 NOTE — Assessment & Plan Note (Signed)
Not thought consistent with this - antibiotics were discontinued prior to discharge.

## 2017-07-08 NOTE — Assessment & Plan Note (Signed)
Suspected - awaiting pulm eval.

## 2017-07-08 NOTE — Telephone Encounter (Signed)
Pt seen today

## 2017-07-08 NOTE — Assessment & Plan Note (Signed)
Known thoracolumbar scoliosis - may be contributing to restrictive lung disease. Await pulm eval.

## 2017-07-08 NOTE — Assessment & Plan Note (Addendum)
Will refer to cardiology for further evaluation. Anticipate pulmonary eval will take precedence.  Continue lasix 20-40mg  daily - discussed trying 20mg  dose and if good UOP with this, may continue 20mg  daily. Check BMP today.

## 2017-07-09 ENCOUNTER — Encounter: Payer: Self-pay | Admitting: Pulmonary Disease

## 2017-07-09 ENCOUNTER — Ambulatory Visit (INDEPENDENT_AMBULATORY_CARE_PROVIDER_SITE_OTHER): Payer: 59 | Admitting: Pulmonary Disease

## 2017-07-09 VITALS — BP 120/74 | HR 78 | Ht 70.25 in | Wt 233.0 lb

## 2017-07-09 DIAGNOSIS — J9601 Acute respiratory failure with hypoxia: Secondary | ICD-10-CM | POA: Diagnosis not present

## 2017-07-09 DIAGNOSIS — J9602 Acute respiratory failure with hypercapnia: Secondary | ICD-10-CM

## 2017-07-09 DIAGNOSIS — I270 Primary pulmonary hypertension: Secondary | ICD-10-CM

## 2017-07-09 DIAGNOSIS — Z23 Encounter for immunization: Secondary | ICD-10-CM

## 2017-07-09 DIAGNOSIS — I5189 Other ill-defined heart diseases: Secondary | ICD-10-CM | POA: Diagnosis not present

## 2017-07-09 DIAGNOSIS — J9612 Chronic respiratory failure with hypercapnia: Secondary | ICD-10-CM

## 2017-07-09 DIAGNOSIS — G4733 Obstructive sleep apnea (adult) (pediatric): Secondary | ICD-10-CM

## 2017-07-09 DIAGNOSIS — J9611 Chronic respiratory failure with hypoxia: Secondary | ICD-10-CM | POA: Diagnosis not present

## 2017-07-09 NOTE — Assessment & Plan Note (Signed)
We discussed Lasix dosing based on weight.  He is awaiting cardiology appointment for further assessment.

## 2017-07-09 NOTE — Progress Notes (Signed)
Subjective:    [name], male    DOB: 05-16-1955, 62 y.o.   MRN: [id number]  HPI  Chief Complaint  Patient presents with  . Pulm Consult    Referred by Dr. Danise Mina for hypoxia. Discovered this after coming home from [place].   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 He is accompanied by his wife in [place] who is a OB/GYN in town  He reports feeling winded after recent trip to [place] although he was able to engage in biking and walking.  He went to his PCP soon after arriving back in [place] on 7/20 was found to have oxygen saturation 66% and was hospitalized. ABG was 7.31/72/71 on 4 L oxygen and initial bicarbonate was 33 on admission, and 07/2016 bicarbonate was 27.  He was admitted to stepdown, required transient BiPAP due to severe desaturation, was diuresed with Lasix from his admission weight of 239 to his discharge weight of 232 pounds.  Antibiotics was initiated but then stopped since he did not have any leukocytosis.  BNP was 233. He was discharged on 3 L oxygen on exertion and 2 L at rest which he has been compliant with since discharge.  CT angiogram did not show any evidence of pulmonary embolism but showed bibasilar atelectasis and severe scoliosis.  He reports a lifelong history of scoliosis and he decided against surgery when he was a young adult.  This is never limited him.  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 Bedtime is around midnight, he generally sleeps on his right side on his back, with one pillow, reports 1-2 nocturnal awakenings including nocturia and is out of bed by  8 AM feeling rested with mild dryness of mouth but denies headaches.  Loud snoring is been noted by his wife and she has also witnessed apneas for many years but he has resisted sleep evaluation  There is no history suggestive of cataplexy, sleep paralysis or parasomnias      Significant tests/ events reviewed  CT angiogram 07/2017 >> severe scoliosis, suggestion of pulmonary hypertension. Echo 04/979 grade 2 diastolic dysfunction, normal LV function, decreased RV function but normal PA pressures EKG-right axis deviation   Past Medical History:  Diagnosis Date  . BPH (benign prostatic hyperplasia)    on flomax  . Bundle branch block    per prior PCP records  . Diverticulosis    by colonoscopy  . Eczema    per prior PCP records  . Elevated PSA 2015   peaked 6s, s/p benign biopsy 2014, [name]  . Essential tremor   . GERD (gastroesophageal reflux disease)    per prior PCP records  . History of panic attacks    per prior PCP records  . HTN (hypertension)   . Mild intermittent asthma in adult without complication   . Obesity, Class I, BMI 30-34.9   . Osteoporosis    DEXA 06/2013 with osteopenia - h/o ?wrist/hip fracture from AVN from chronic steroid use (asthma), took reclast for 1 year  . Renal artery stenosis in 1 of 2 vessels ([place]) 2011   by [place]  .  Seasonal allergies   . Thoracic scoliosis childhood  . Transaminitis    per prior PCP records   Past Surgical History:  Procedure Laterality Date  . COLONOSCOPY  12/2013   polyps, int hem, diverticulosis, rpt 5 yrs ([place])  . ELBOW SURGERY Right Apr 14, 2006   golfer's elbow  . INGUINAL HERNIA REPAIR Bilateral childhood & 1993-04-13  . NASAL SEPTUM SURGERY  04/14/2011   deviated septum  . US ECHOCARDIOGRAPHY  02/2009   WNL, EF >55% [facility])  . US RENAL/AORTA Right 05/2009   1-59% diameter reduction renal artery, rec rpt 2 yrs    Allergies  Allergen Reactions  . Accupril [Quinapril Hcl] Cough    Social History     Socioeconomic History  . Marital status: Married    Spouse name: Not on file  . Number of children: Not on file  . Years of education: Not on file  . Highest education level: Not on file  Occupational History  . Not on file  Social Needs  . Financial resource strain: Not on file  . Food insecurity:    Worry: Not on file    Inability: Not on file  . Transportation needs:    Medical: Not on file    Non-medical: Not on file  Tobacco Use  . Smoking status: Never Smoker  . Smokeless tobacco: Never Used  Substance and Sexual Activity  . Alcohol use: Yes    Alcohol/week: 0.0 oz    Comment: Social  . Drug use: No  . Sexual activity: Yes    Birth control/protection: None  Lifestyle  . Physical activity:    Days per week: Not on file    Minutes per session: Not on file  . Stress: Not on file  Relationships  . Social connections:    Talks on phone: Not on file    Gets together: Not on file    Attends religious service: Not on file    Active member of club or organization: Not on file    Attends meetings of clubs or organizations: Not on file    Relationship status: Not on file  . Intimate partner violence:    Fear of current or ex partner: Not on file    Emotionally abused: Not on file    Physically abused: Not on file    Forced sexual activity: Not on file  Other Topics Concern  . 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 Cancer Father 62       colon  . Prostate cancer Father   . Diabetes Mother   . Alcohol abuse Mother   . Stroke Mother   . Cancer Maternal Grandmother        pancreatic  . Ataxia Brother   . Ataxia Sister   . CAD Neg Hx      Review of Systems  Lower  extremity edema is resolved, breathing is improving  Constitutional: negative for anorexia, fevers and sweats  Eyes: negative for irritation, redness and visual disturbance  Ears, nose, mouth, throat, and face: negative for earaches, epistaxis, nasal congestion and sore throat  Respiratory: negative for cough,sputum and wheezing  Cardiovascular: negative  for chest pain,  orthopnea, palpitations and syncope  Gastrointestinal: negative for abdominal pain, constipation, diarrhea, melena, nausea and vomiting  Genitourinary:negative for dysuria, frequency and hematuria  Hematologic/lymphatic: negative for bleeding, easy bruising and lymphadenopathy  Musculoskeletal:negative for arthralgias, muscle weakness and stiff joints  Neurological: negative for coordination problems, gait problems, headaches and weakness  Endocrine: negative for diabetic symptoms including polydipsia, polyuria and weight loss     Objective:   Physical Exam   Gen. Pleasant, obese, in no distress, normal affect ENT -  no post nasal drip, class 2 airway Neck: No JVD, no thyromegaly, no carotid bruits Lungs: no use of accessory muscles, no dullness to percussion, decreased without rales or rhonchi  Cardiovascular: Rhythm regular, heart sounds  normal, no murmurs or gallops, no peripheral edema Abdomen: soft and non-tender, no hepatosplenomegaly, BS normal. Musculoskeletal: severe scoliosis , deviated ot right , no cyanosis or clubbing Neuro:  alert, non focal, no tremors        Assessment & Plan:

## 2017-07-09 NOTE — Assessment & Plan Note (Signed)
Given excessive daytime somnolence, narrow pharyngeal exam, witnessed apneas & loud snoring, obstructive sleep apnea is very likely & an overnight polysomnogram will be scheduled as a home study. The pathophysiology of obstructive sleep apnea , it's cardiovascular consequences & modes of treatment including CPAP were discused with the patient in detail & they evidenced understanding.   He will likely need BiPAP due to hypoventilation during sleep

## 2017-07-09 NOTE — Assessment & Plan Note (Signed)
Appears to be related to severe scoliosis and restrictive lung disease with or without component of diastolic heart failure.  The high bicarbonate indicates that he has baseline hypercarbia in the 60 range.  Schedule PFTs to assess especially in the light of prior history of asthma Referral to pulmonary rehab. Prescription for incentive spirometer.  Prevnar vaccine today   Reassess in 4 weeks for need for portable concentrator We will repeat ABG once he is clinically improved

## 2017-07-09 NOTE — Patient Instructions (Signed)
You have high carbon dioxide levels related to severe scoliosis with an element of diastolic heart failure +/-sleep apnea  Schedule split  sleep study  Referral to pulmonary rehab. Prescription for incentive spirometer.  Prevnar vaccine today  Aim for weight of 229 pounds, take 40 mg of Lasix if you gain up to 3 pounds  Reassess in 4 weeks for need for portable concentrator

## 2017-07-09 NOTE — Assessment & Plan Note (Signed)
He also seems to have some degree of pulmonary hypertension as evidenced on echo and CT Angio Unclear how much of this was related to acute episode.  Will likely need repeat echo in a few months to reassess

## 2017-07-16 ENCOUNTER — Telehealth (HOSPITAL_COMMUNITY): Payer: Self-pay

## 2017-07-16 NOTE — Telephone Encounter (Signed)
Patients insurance is active and benefits verified through [employer] - No co-pay, deductible amount of $1,500/$0.00 has been met, out of pocket amount of $6,000/$602.72 has been met, 30% co-insurance, and no pre-authorization is required. Reference #[id number]

## 2017-07-17 ENCOUNTER — Telehealth: Payer: Self-pay | Admitting: Pulmonary Disease

## 2017-07-17 NOTE — Telephone Encounter (Signed)
Spoke with pt. He is wanting to follow up on his pulmonary rehab referral. Advised him that the referral was placed and the pulmonary rehab department will contact him to get him set up. Pt verbalized understanding. Nothing further was needed.

## 2017-07-25 ENCOUNTER — Telehealth (HOSPITAL_COMMUNITY): Payer: Self-pay

## 2017-07-25 NOTE — Telephone Encounter (Signed)
Called patient in regards to Pulmonary Rehab - patient stated he is interested in the program. Scheduled orientation on 08/14/17 at 9;30am. Patient will attend the 1:30pm exc class. Went over insurance with patient and he verbalized understanding. Mailed packet.

## 2017-07-29 ENCOUNTER — Telehealth: Payer: Self-pay | Admitting: Family Medicine

## 2017-07-29 NOTE — Telephone Encounter (Unsigned)
Copied from [place] [id number]. Topic: Quick Communication - See Telephone Encounter >> Jul 29, 2017  9:05 AM Hewitt Shorts wrote: Pt is needing a refill on lasix-he says that he is completely out and the pharmacy states it is too early but dosage has been changed to 1 tableit pf 20 mg  Best number [phone]  [facility] [address]

## 2017-07-30 ENCOUNTER — Other Ambulatory Visit: Payer: PRIVATE HEALTH INSURANCE

## 2017-07-30 NOTE — Telephone Encounter (Signed)
Left message on vm per dpr informing pt it is too early for a refill on the Lasix.  His last rx was 07/08/17, #60 with 3 refills.  Even if he is taking 2 tabs daily, it's still too early for a refill.

## 2017-08-04 ENCOUNTER — Encounter (HOSPITAL_BASED_OUTPATIENT_CLINIC_OR_DEPARTMENT_OTHER): Payer: 59

## 2017-08-06 ENCOUNTER — Encounter: Payer: PRIVATE HEALTH INSURANCE | Admitting: Family Medicine

## 2017-08-07 ENCOUNTER — Encounter: Payer: Self-pay | Admitting: Pulmonary Disease

## 2017-08-07 NOTE — Telephone Encounter (Signed)
RA- please advise on pt email, thanks!  [name],  My urologist, Dr. Diona Fanti, prescribed Sildenafil, 20 MG tablets, which I take 80 MG of as needed. I'm not sure if I listed it on my medication list with your office. Is it OK for me to continue taking this medication?

## 2017-08-07 NOTE — Telephone Encounter (Signed)
No contraindication from our standpoint. Just to ensure that he is not taking nitroglycerin

## 2017-08-12 ENCOUNTER — Ambulatory Visit (HOSPITAL_BASED_OUTPATIENT_CLINIC_OR_DEPARTMENT_OTHER): Payer: 59 | Attending: Pulmonary Disease | Admitting: Pulmonary Disease

## 2017-08-12 VITALS — Ht 71.0 in | Wt 224.0 lb

## 2017-08-12 DIAGNOSIS — G4733 Obstructive sleep apnea (adult) (pediatric): Secondary | ICD-10-CM | POA: Insufficient documentation

## 2017-08-12 DIAGNOSIS — R0902 Hypoxemia: Secondary | ICD-10-CM | POA: Insufficient documentation

## 2017-08-13 NOTE — Telephone Encounter (Signed)
  NPSG showed severe OSA - AHI 39/h  Pl send Rx for CPAP  9 cm H2O with a Medium size Fisher&Paykel Full Face Mask Simplus mask and heated humidification. Blend in 2L O2 into CPAP OV with me in 6 wks

## 2017-08-13 NOTE — Procedures (Signed)
Patient Name: Louis [name], Louis [name] Date: 08/12/2017   Gender: Male  D.O.B: [birth year]  Age (years): 4  Referring Provider: Kara Mead MD, ABSM  Height (inches): 71  Interpreting Physician: Kara Mead MD, ABSM  Weight (lbs): 224  RPSGT: Laren Everts  BMI: 31  MRN: [id number]  Neck Size: 19.00  <br> <br>  CLINICAL INFORMATION  Sleep Study Type: Split Night CPAP Indication for sleep study: Hypertension, Obesity, OSA, Snoring, Witnessed Apneas Epworth Sleepiness Score: 2 SLEEP STUDY TECHNIQUE  As per the AASM Manual for the Scoring of Sleep and Associated Events v2.3 (April 2016) with a hypopnea requiring 4% desaturations. The channels recorded and monitored were frontal, central and occipital EEG, electrooculogram (EOG), submentalis EMG (chin), nasal and oral airflow, thoracic and abdominal wall motion, anterior tibialis EMG, snore microphone, electrocardiogram, and pulse oximetry. Continuous positive airway pressure (CPAP) was initiated when the patient met split night criteria and was titrated according to treat sleep-disordered breathing. MEDICATIONS  Medications self-administered by patient taken the night of the study : XANAX RESPIRATORY PARAMETERS  Diagnostic Total AHI (/hr): 39.5 RDI (/hr): 49.8 OA Index (/hr): - CA Index (/hr): 0.0  REM AHI (/hr): 63.5 NREM AHI (/hr): 35.7 Supine AHI (/hr): N/A Non-supine AHI (/hr): 39.5  Min O2 Sat (%): 57.0 Mean O2 (%): 80.1 Time below 88% (min): 118.9      Titration Optimal Pressure (cm): 9 AHI at Optimal Pressure (/hr): 4.0 Min O2 at Optimal Pressure (%): 73.0  Supine % at Optimal (%): 3 Sleep % at Optimal (%): 91      SLEEP ARCHITECTURE  The recording time for the entire night was 407.9 minutes. During a baseline period of 193.5 minutes, the patient slept for 123.0 minutes in REM and nonREM, yielding a sleep efficiency of 63.6%%. Sleep onset after lights out was 34.6 minutes with a REM latency of 141.0 minutes. The patient spent  13.8%% of the night in stage N1 sleep, 72.4%% in stage N2 sleep, 0.0%% in stage N3 and 13.8% in REM. During the titration period of 207.1 minutes, the patient slept for 154.0 minutes in REM and nonREM, yielding a sleep efficiency of 74.4%%. Sleep onset after CPAP initiation was 11.8 minutes with a REM latency of 82.5 minutes. The patient spent 8.4%% of the night in stage N1 sleep, 66.2%% in stage N2 sleep, 0.0%% in stage N3 and 25.3% in REM. CARDIAC DATA  The 2 lead EKG demonstrated sinus rhythm. The mean heart rate was 100.0 beats per minute. Other EKG findings include: PVCs.  LEG MOVEMENT DATA  The total Periodic Limb Movements of Sleep (PLMS) were 0. The PLMS index was 0.0 . IMPRESSIONS  - Severe obstructive sleep apnea occurred during the diagnostic portion of the study (AHI = 39.5/hour). An optimal PAP pressure was selected for this patient ( 9 cm of water) - No significant central sleep apnea occurred during the diagnostic portion of the study (CAI = 0.0/hour).  - Severe oxygen desaturation was noted during the diagnostic portion of the study (Min O2 = 57.0%).2L O2 was blended in due to desaturations at the final CPAP level - The patient snored with moderate snoring volume during the diagnostic portion of the study.  - EKG findings include PVCs.  - Clinically significant periodic limb movements did not occur during sleep. DIAGNOSIS  - Obstructive Sleep Apnea (327.23 [G47.33 ICD-10])  - Nocturnal Hypoxemia (327.26 [G47.36 ICD-10]) RECOMMENDATIONS  - Trial of CPAP therapy on 9 cm H2O with a Medium size Fisher&Paykel Full Face Mask  Simplus mask and heated humidification. - 2L O2 should be blended into CPAP - Avoid alcohol, sedatives and other CNS depressants that may worsen sleep apnea and disrupt normal sleep architecture.  - Sleep hygiene should be reviewed to assess factors that may improve sleep quality.  - Weight management and regular exercise should be initiated or continued.  -  [facility] after 4 weeks of therapy   Kara Mead MD Board Certified in [place]

## 2017-08-14 ENCOUNTER — Encounter (HOSPITAL_COMMUNITY): Payer: Self-pay

## 2017-08-14 ENCOUNTER — Encounter (HOSPITAL_COMMUNITY)
Admission: RE | Admit: 2017-08-14 | Discharge: 2017-08-14 | Disposition: A | Discharge status: Home / Self Care | Payer: 59 | Source: Ambulatory Visit | Attending: Pulmonary Disease | Admitting: Pulmonary Disease

## 2017-08-14 VITALS — BP 131/77 | HR 64 | Temp 98.0°F | Resp 24 | Ht 69.25 in | Wt 228.2 lb

## 2017-08-14 DIAGNOSIS — J9602 Acute respiratory failure with hypercapnia: Secondary | ICD-10-CM | POA: Insufficient documentation

## 2017-08-14 DIAGNOSIS — J9601 Acute respiratory failure with hypoxia: Secondary | ICD-10-CM | POA: Insufficient documentation

## 2017-08-14 DIAGNOSIS — I5189 Other ill-defined heart diseases: Secondary | ICD-10-CM | POA: Diagnosis not present

## 2017-08-14 NOTE — Progress Notes (Addendum)
Louis [name] 62 y.o. male Pulmonary Rehab Orientation Note [name] referred to Pulmonary Rehab by [name] for Respiratory Failure with Hypoxia/Hypercapnia,  arrived today in Cardiac and Pulmonary Rehab for orientation to Pulmonary Rehab. He arrived ambulatory with no complaints or difficulties. He does not carry portable oxygen. Per pt, he uses oxygen at night when going to sleep. Pt had split study sleep test completed last night.  Results are unknown but he suspects he does have sleep apnea. Color good, skin warm and dry. Patient is oriented to time and place. [name] medical history, psychosocial health, and medications reviewed. Psychosocial assessment reveals pt lives with their spouse. 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 Pt hobbies include fly fishing, hiking, playing with his dog, golf, reading and cooking. 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 PHQ2/9 score 0/0 Pt shows good  coping skills with negative outlook.  Pt attributes this to his strong faith in God. Will continue to monitor and evaluate progress toward psychosocial goal(s) of continued mental well being. Physical assessment reveals heart rate is normal, breath sounds clear to auscultation, no wheezes, rales, or rhonchi, however diminished. Grip strength equal, strong. Distal pulses palpable with no swelling. Patient reports he does take medications as prescribed. Patient states he follows a Low fat, Low Sodium diet. The patient has been trying to lose weight through a healthy diet and exercise program. Pt has lost about 15 pounds and would like to see his weight below 200 pounds. [name] weight will be monitored closely. Demonstration and practice of PLB using pulse oximeter. Patient able to return demonstration satisfactorily. Safety and hand hygiene in the exercise area reviewed with patient. Patient  voices understanding of the information reviewed. Department expectations discussed with patient and achievable goals were set. The patient shows enthusiasm about attending the program and we look forward to working with this nice gentleman The patient is scheduled for a 6 min walk test on 8/22 and to begin exercise on 8/29 at 1:30 pm. 45 minutes was spent on a variety of activities such as assessment of the patient, obtaining baseline data including height, weight, BMI, and grip strength, verifying medical history, allergies, and current medications, and teaching patient strategies for performing tasks with less respiratory effort with emphasis on pursed lip breathing.  [place], BSN Cardiac and Training and development officer

## 2017-08-14 NOTE — Progress Notes (Signed)
Louis [name] 62 y.o. male   DOB: 1955-07-29 MRN: [id number]          Nutrition 1. Acute respiratory failure with hypoxia and hypercapnia (HCC)    Past Medical History:  Diagnosis Date  . BPH (benign prostatic hyperplasia)    on flomax  . Bundle branch block    per prior PCP records  . Diverticulosis    by colonoscopy  . Eczema    per prior PCP records  . Elevated PSA 2015   peaked 6s, s/p benign biopsy 2014, sees urology Louis [name] ([place])  . Essential tremor   . GERD (gastroesophageal reflux disease)    per prior PCP records  . History of panic attacks    per prior PCP records  . HTN (hypertension)   . Mild intermittent asthma in adult without complication   . Obesity, Class I, BMI 30-34.9   . Osteoporosis    DEXA 06/2013 with osteopenia - h/o ?wrist/hip fracture from AVN from chronic steroid use (asthma), took reclast for 1 year  . Renal artery stenosis in 1 of 2 vessels ([place]) 2011   by [place]  . Seasonal allergies   . Thoracic scoliosis childhood  . Transaminitis    per prior PCP records   Meds reviewed. Albuterol lasix, alodipine, toprol, cozaar noted  Ht: Ht Readings from Last 1 Encounters:  08/14/17 5' 9.25" (1.759 m)     Wt:  Wt Readings from Last 3 Encounters:  08/14/17 228 lb 2.8 oz (103.5 kg)  08/12/17 224 lb (101.6 kg)  07/09/17 233 lb (105.7 kg)     BMI: 33.45    Current tobacco use? no  Labs:  Lipid Panel     Component Value Date/Time   CHOL 214 (H) 09/26/2015 1328   CHOL 204 10/11/2014   TRIG 60.0 09/26/2015 1328   TRIG 51 10/11/2014   HDL 100.60 09/26/2015 1328   HDL 75 10/11/2014   CHOLHDL 2 09/26/2015 1328   VLDL 12.0 09/26/2015 1328   LDLCALC 101 (H) 09/26/2015 1328   LDLCALC 119 10/11/2014    No results found for: HGBA1C .  Nutrition Diagnosis  ? Overweight/obesity related to excessive energy intake as evidenced by a BMI of 33.45  Nutrition Intervention ? Pt's individual nutrition plan and goals reviewed with pt. ? Benefits of  adopting healthy eating habits discussed when pt's Rate Your Plate reviewed.  Goal(s) 1. Pt to identify and limit food sources of sodium. 2. Identify food quantities necessary to achieve wt loss of  -2# per week to a goal wt loss of 6-24 lb at graduation from pulmonary rehab. 3. Describe the benefit of including fruits, vegetables, whole grains, and low-fat dairy products in a healthy meal plan.  Plan:  Pt to attend Pulmonary Nutrition class Will provide client-centered nutrition education as part of interdisciplinary care.    Monitor and Evaluate progress toward nutrition goal with team.   Louis Bustle, MS, RD, LDN 08/14/2017 1:56 PM

## 2017-08-15 ENCOUNTER — Ambulatory Visit (INDEPENDENT_AMBULATORY_CARE_PROVIDER_SITE_OTHER): Payer: 59 | Admitting: Pulmonary Disease

## 2017-08-15 ENCOUNTER — Encounter: Payer: Self-pay | Admitting: Pulmonary Disease

## 2017-08-15 VITALS — BP 116/68 | HR 74 | Ht 66.73 in | Wt 223.8 lb

## 2017-08-15 DIAGNOSIS — J9601 Acute respiratory failure with hypoxia: Secondary | ICD-10-CM | POA: Diagnosis not present

## 2017-08-15 DIAGNOSIS — J9602 Acute respiratory failure with hypercapnia: Secondary | ICD-10-CM

## 2017-08-15 DIAGNOSIS — J9612 Chronic respiratory failure with hypercapnia: Secondary | ICD-10-CM | POA: Diagnosis not present

## 2017-08-15 DIAGNOSIS — G4733 Obstructive sleep apnea (adult) (pediatric): Secondary | ICD-10-CM | POA: Diagnosis not present

## 2017-08-15 DIAGNOSIS — J9611 Chronic respiratory failure with hypoxia: Secondary | ICD-10-CM

## 2017-08-15 DIAGNOSIS — J4520 Mild intermittent asthma, uncomplicated: Secondary | ICD-10-CM

## 2017-08-15 LAB — PULMONARY FUNCTION TEST
DL/VA % pred: 123 %
DL/VA: 5.44 ml/min/mmHg/L
DLCO cor % pred: 92 %
DLCO cor: 25.72 ml/min/mmHg
DLCO unc % pred: 96 %
DLCO unc: 27 ml/min/mmHg
FEF 25-75 Post: 1.19 L/sec
FEF 25-75 Pre: 1.06 L/sec
FEF2575-%Change-Post: 12 %
FEF2575-%Pred-Post: 44 %
FEF2575-%Pred-Pre: 39 %
FEV1-%Change-Post: 5 %
FEV1-%Pred-Post: 61 %
FEV1-%Pred-Pre: 58 %
FEV1-Post: 1.97 L
FEV1-Pre: 1.87 L
FEV1FVC-%Change-Post: 9 %
FEV1FVC-%Pred-Pre: 84 %
FEV6-%Change-Post: -3 %
FEV6-%Pred-Post: 68 %
FEV6-%Pred-Pre: 71 %
FEV6-Post: 2.76 L
FEV6-Pre: 2.87 L
FEV6FVC-%Change-Post: 0 %
FEV6FVC-%Pred-Post: 104 %
FEV6FVC-%Pred-Pre: 103 %
FVC-%Change-Post: -4 %
FVC-%Pred-Post: 65 %
FVC-%Pred-Pre: 68 %
FVC-Post: 2.79 L
FVC-Pre: 2.9 L
Post FEV1/FVC ratio: 71 %
Post FEV6/FVC ratio: 99 %
Pre FEV1/FVC ratio: 64 %
Pre FEV6/FVC Ratio: 99 %
RV % pred: 133 %
RV: 2.76 L
TLC % pred: 90 %
TLC: 5.72 L

## 2017-08-15 MED ORDER — BUDESONIDE-FORMOTEROL FUMARATE 160-4.5 MCG/ACT IN AERO: 2.0000 | INHALATION_SPRAY | Freq: Two times a day (BID) | RESPIRATORY_TRACT | 0 refills | Status: DC

## 2017-08-15 NOTE — Assessment & Plan Note (Signed)
Trial of Symbicort given mild airway obstruction and PFTs although it does seem to be mostly restriction. Of note he also has a history of asthma in the remote past requiring Advair

## 2017-08-15 NOTE — Assessment & Plan Note (Signed)
Daytime oxygenation is improved and he does not require portable oxygen. He will continue to use 2 L oxygen blended into CPAP

## 2017-08-15 NOTE — Assessment & Plan Note (Signed)
We will initiate CPAP 9 cm with 2 L oxygen but did not. We will check download in 1 month and tweak settings if required. We will recheck nocturnal oximetry in 3 months -I am hopeful that his lung mechanics improved, nocturnal oxygen can be weaned off. If for some reason hypoventilation is suspected and persistent, then we can consider bilevel in the future

## 2017-08-15 NOTE — Patient Instructions (Signed)
Sample of Symbicort 160-2 puffs twice daily, rinse mouth after use.  Continue with pulmonary rehab.  Prescription for CPAP 9 cm with air fit F 30 fullface mask, humidity, with 2 L oxygen blended in, EPR setting 2

## 2017-08-15 NOTE — Progress Notes (Signed)
   Subjective:    [name], male    DOB: 26-Nov-1955, 62 y.o.   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 He is accompanied by his wife Louis [name] who is a OB/GYN in town  He   was hospitalized with hypoxia & hypercarbia  In 07/2017 after trip to [place] ABG was 7.31/72/71 on 4 L oxygen and initial bicarbonate was 33 on admission, and 07/2016 bicarbonate was 27.  He required transient BiPAP due to severe desaturation, was diuresed with Lasix from his admission weight of 239 to his discharge weight of 232 pounds.  Antibiotics was initiated but then stopped since he did not have any leukocytosis.  BNP was 233. On his initial visit with a 07/09/2017, he did not seem to have significant desaturation and daytime oxygen was discontinued  We reviewed PFTs and sleep study today  Significant tests/ events reviewed  CT angiogram 07/2017 >> severe scoliosis, suggestion of pulmonary hypertension. Echo 06/9627 grade 2 diastolic dysfunction, normal LV function, decreased RV function but normal PA pressures EKG-right axis deviation  NPSG AHI 39/h, >> CPAP 9 cm + 2L o2  PFTs 08/2017 >> postbronchodilator ratio 71, FEV1 61% with FVC 65%, no significant buccal dilator response, TLC 90%, DLCO 96% suggestive extraparenchymal restriction  Past Medical History:  Diagnosis Date  . BPH (benign prostatic hyperplasia)    on flomax  . Bundle branch block    per prior PCP records  . Diverticulosis    by colonoscopy  . Eczema    per prior PCP records  . Elevated PSA 2015   peaked 6s, s/p benign biopsy 2014, sees urology [name] ([place])  . Essential tremor   . GERD (gastroesophageal reflux disease)    per prior PCP records  . History of panic attacks    per prior PCP records  . HTN (hypertension)   . Mild intermittent asthma in adult without complication   . Obesity, Class I, BMI 30-34.9     . Osteoporosis    DEXA 06/2013 with osteopenia - h/o ?wrist/hip fracture from AVN from chronic steroid use (asthma), took reclast for 1 year  . Renal artery stenosis in 1 of 2 vessels ([place]) 2011   by [place]  . Seasonal allergies   . Thoracic scoliosis childhood  . Transaminitis    per prior PCP records      Review of Systems neg for any significant sore throat, dysphagia, itching, sneezing, nasal congestion or excess/ purulent secretions, fever, chills, sweats, unintended wt loss, pleuritic or exertional cp, hempoptysis, orthopnea pnd or change in chronic leg swelling. Also denies presyncope, palpitations, heartburn, abdominal pain, nausea, vomiting, diarrhea or change in bowel or urinary habits, dysuria,hematuria, rash, arthralgias, visual complaints, headache, numbness weakness or ataxia.     Objective:   Physical Exam  Gen. Pleasant, obese, in no distress ENT - no thrush, no post nasal drip Neck: No JVD, no thyromegaly, no carotid bruits Lungs: no use of accessory muscles, no dullness to percussion, decreased without rales or rhonchi  Cardiovascular: Rhythm regular, heart sounds  normal, no murmurs or gallops, no peripheral edema Musculoskeletal: No deformities, no cyanosis or clubbing , no tremors       Assessment & Plan:

## 2017-08-15 NOTE — Progress Notes (Signed)
PFT completed today. 08/15/17

## 2017-08-21 ENCOUNTER — Encounter (HOSPITAL_COMMUNITY): Payer: Self-pay | Admitting: *Deleted

## 2017-08-22 ENCOUNTER — Encounter (HOSPITAL_COMMUNITY)
Admission: RE | Admit: 2017-08-22 | Discharge: 2017-08-22 | Disposition: A | Discharge status: Home / Self Care | Payer: 59 | Source: Ambulatory Visit | Attending: Pulmonary Disease | Admitting: Pulmonary Disease

## 2017-08-22 DIAGNOSIS — J9602 Acute respiratory failure with hypercapnia: Principal | ICD-10-CM

## 2017-08-22 DIAGNOSIS — J9601 Acute respiratory failure with hypoxia: Secondary | ICD-10-CM

## 2017-08-26 NOTE — Progress Notes (Signed)
Pulmonary Individual Treatment Plan  Patient Details  Name: Louis [name] MRN: [id number] Date of Birth: 20-Oct-1955 Referring Provider:     Pulmonary Rehab Walk Test from 08/22/2017 in [place]  Referring Provider  [name]      Initial Encounter Date:    Pulmonary Rehab Walk Test from 08/22/2017 in [place]  Date  08/26/17      Visit Diagnosis: Acute respiratory failure with hypoxia and hypercapnia ([place])  [name] Home Medications on Admission:   Current Outpatient Medications:  .  albuterol (PROVENTIL HFA;VENTOLIN HFA) 108 (90 BASE) MCG/ACT inhaler, Inhale 2 puffs into the lungs every 4 (four) hours as needed for wheezing or shortness of breath., Disp: , Rfl:  .  ALPRAZolam (XANAX) 0.5 MG tablet, Take 1 tablet (0.5 mg total) by mouth at bedtime as needed for sleep., Disp: 30 tablet, Rfl: 0 .  amLODipine (NORVASC) 5 MG tablet, Take 2.5 mg by mouth daily. Pt takes 1/2 tablet daily, Disp: , Rfl:  .  budesonide-formoterol (SYMBICORT) 160-4.5 MCG/ACT inhaler, Inhale 2 puffs into the lungs 2 (two) times daily for 1 day., Disp: 1 Inhaler, Rfl: 0 .  furosemide (LASIX) 20 MG tablet, Take 1-2 tablets (20-40 mg total) by mouth daily. (Patient taking differently: Take 20 mg by mouth daily. ), Disp: 60 tablet, Rfl: 3 .  losartan (COZAAR) 100 MG tablet, TAKE ONE TABLET BY MOUTH ONE TIME DAILY , Disp: 90 tablet, Rfl: 2 .  metoprolol succinate (TOPROL-XL) 50 MG 24 hr tablet, Take 3 tablets (150 mg total) by mouth daily., Disp: 270 tablet, Rfl: 3 .  sildenafil (REVATIO) 20 MG tablet, Take 20 mg by mouth 3 (three) times daily as needed., Disp: , Rfl:  .  urea (CARMOL) 40 % CREA, apply to affected area twice a day for THICKEST AREAS if needed for dry skin, Disp: , Rfl: 0 .  Vitamin D, Ergocalciferol, (DRISDOL) 50000 UNITS CAPS capsule, Take 50,000 Units by mouth every 7 (seven) days., Disp: , Rfl:   Past Medical History: Past Medical  History:  Diagnosis Date  . BPH (benign prostatic hyperplasia)    on flomax  . Bundle branch block    per prior PCP records  . Diverticulosis    by colonoscopy  . Eczema    per prior PCP records  . Elevated PSA 2015   peaked 6s, s/p benign biopsy 2014, sees urology [name] ([place])  . Essential tremor   . GERD (gastroesophageal reflux disease)    per prior PCP records  . History of panic attacks    per prior PCP records  . HTN (hypertension)   . Mild intermittent asthma in adult without complication   . Obesity, Class I, BMI 30-34.9   . Osteoporosis    DEXA 06/2013 with osteopenia - h/o ?wrist/hip fracture from AVN from chronic steroid use (asthma), took reclast for 1 year  . Renal artery stenosis in 1 of 2 vessels ([place]) 2011   by [place]  . Seasonal allergies   . Thoracic scoliosis childhood  . Transaminitis    per prior PCP records    Tobacco Use: Social History   Tobacco Use  Smoking Status Never Smoker  Smokeless Tobacco Never Used    Labs: Recent Review Flowsheet Data    Labs for ITP Cardiac and Pulmonary Rehab Latest Ref Rng & Units 06/16/2013 10/11/2014 09/26/2015 07/02/2017   Cholestrol 0 - 200 mg/dL 204 204 214(H) -   LDLCALC 0 -  99 mg/dL 119 119 101(H) -   HDL >39.00 mg/dL 75 75 100.60 -   Trlycerides 0.0 - 149.0 mg/dL 51 51 60.0 -   PHART 7.350 - 7.450 - - - 7.305(L)   PCO2ART 32.0 - 48.0 mmHg - - - 72.4(HH)   HCO3 20.0 - 28.0 mmol/L - - - 35.0(H)   O2SAT % - - - 90.9      Capillary Blood Glucose: No results found for: GLUCAP   Pulmonary Assessment Scores: Pulmonary Assessment Scores    Row Name 08/21/17 1142 08/26/17 1018       ADL UCSD   ADL Phase  Entry  Entry    SOB Score total  12  -      CAT Score   CAT Score  2  -      mMRC Score   mMRC Score  -  0       Pulmonary Function Assessment:   Exercise Target Goals: Exercise Program Goal: Individual exercise prescription set using results from initial 6 min walk test and THRR while  considering  patient's activity barriers and safety.   Exercise Prescription Goal: Initial exercise prescription builds to 30-45 minutes a day of aerobic activity, 2-3 days per week.  Home exercise guidelines will be given to patient during program as part of exercise prescription that the participant will acknowledge.  Activity Barriers & Risk Stratification: Activity Barriers & Cardiac Risk Stratification - 08/14/17 1103      Activity Barriers & Cardiac Risk Stratification   Activity Barriers  Other (comment);Shortness of Breath;Back Problems;Arthritis    Comments  Scoliosis, right hip arthritis    Cardiac Risk Stratification  High       6 Minute Walk: 6 Minute Walk    Row Name 08/26/17 1018         6 Minute Walk   Phase  Initial     Distance  1200 feet     Walk Time  6 minutes     # of Rest Breaks  0     MPH  2.27     METS  2.68     RPE  12     Perceived Dyspnea   2     Symptoms  No     Resting HR  75 bpm     Resting BP  144/88     Resting Oxygen Saturation   93 %     Exercise Oxygen Saturation  during 6 min walk  88 %     Max Ex. HR  124 bpm     Max Ex. BP  170/80     2 Minute Post BP  142/84       Interval HR   1 Minute HR  77     2 Minute HR  76     3 Minute HR  76     4 Minute HR  76     5 Minute HR  124     6 Minute HR  112     2 Minute Post HR  84     Interval Heart Rate?  Yes       Interval Oxygen   Interval Oxygen?  Yes     Baseline Oxygen Saturation %  93 %     1 Minute Oxygen Saturation %  92 %     1 Minute Liters of Oxygen  0 L     2 Minute Oxygen Saturation %  88 %  2 Minute Liters of Oxygen  0 L     3 Minute Oxygen Saturation %  88 %     3 Minute Liters of Oxygen  0 L     4 Minute Oxygen Saturation %  88 %     4 Minute Liters of Oxygen  0 L     5 Minute Oxygen Saturation %  88 %     5 Minute Liters of Oxygen  0 L     6 Minute Oxygen Saturation %  89 %     6 Minute Liters of Oxygen  0 L     2 Minute Post Oxygen Saturation %  94 %      2 Minute Post Liters of Oxygen  0 L        Oxygen Initial Assessment: Oxygen Initial Assessment - 08/26/17 1017      Initial 6 min Walk   Oxygen Used  None      Program Oxygen Prescription   Program Oxygen Prescription  None       Oxygen Re-Evaluation:   Oxygen Discharge (Final Oxygen Re-Evaluation):   Initial Exercise Prescription: Initial Exercise Prescription - 08/26/17 1000      Date of Initial Exercise RX and Referring Provider   Date  08/26/17    Referring Provider  [name]      Recumbant Bike   Level  1    Watts  10    Minutes  17      NuStep   Level  2    SPM  80    Minutes  17    METs  1.5      Track   Laps  6    Minutes  17      Prescription Details   Frequency (times per week)  2    Duration  Progress to 45 minutes of aerobic exercise without signs/symptoms of physical distress      Intensity   THRR 40-80% of Max Heartrate  64-128    Ratings of Perceived Exertion  11-13    Perceived Dyspnea  0-4      Progression   Progression  Continue progressive overload as per policy without signs/symptoms or physical distress.      Resistance Training   Training Prescription  Yes    Weight  blue bands    Reps  10-15       Perform Capillary Blood Glucose checks as needed.  Exercise Prescription Changes:   Exercise Comments:   Exercise Goals and Review: Exercise Goals    Row Name 08/14/17 1023             Exercise Goals   Increase Physical Activity  Yes       Intervention  Provide advice, education, support and counseling about physical activity/exercise needs.;Develop an individualized exercise prescription for aerobic and resistive training based on initial evaluation findings, risk stratification, comorbidities and participant's personal goals.       Expected Outcomes  Short Term: Attend rehab on a regular basis to increase amount of physical activity.;Long Term: Exercising regularly at least 3-5 days a week.;Long Term: Add in home  exercise to make exercise part of routine and to increase amount of physical activity.       Increase Strength and Stamina  Yes       Intervention  Provide advice, education, support and counseling about physical activity/exercise needs.;Develop an individualized exercise prescription for aerobic and resistive training based on initial evaluation findings,  risk stratification, comorbidities and participant's personal goals.       Expected Outcomes  Short Term: Increase workloads from initial exercise prescription for resistance, speed, and METs.;Short Term: Perform resistance training exercises routinely during rehab and add in resistance training at home;Long Term: Improve cardiorespiratory fitness, muscular endurance and strength as measured by increased METs and functional capacity (6MWT)       Able to understand and use rate of perceived exertion (RPE) scale  Yes       Intervention  Provide education and explanation on how to use RPE scale       Expected Outcomes  Short Term: Able to use RPE daily in rehab to express subjective intensity level;Long Term:  Able to use RPE to guide intensity level when exercising independently       Able to understand and use Dyspnea scale  Yes       Intervention  Provide education and explanation on how to use Dyspnea scale       Expected Outcomes  Short Term: Able to use Dyspnea scale daily in rehab to express subjective sense of shortness of breath during exertion;Long Term: Able to use Dyspnea scale to guide intensity level when exercising independently       Knowledge and understanding of Target Heart Rate Range (THRR)  Yes       Intervention  Provide education and explanation of THRR including how the numbers were predicted and where they are located for reference       Expected Outcomes  Short Term: Able to state/look up THRR;Long Term: Able to use THRR to govern intensity when exercising independently;Short Term: Able to use daily as guideline for intensity in  rehab       Understanding of Exercise Prescription  Yes       Intervention  Provide education, explanation, and written materials on patient's individual exercise prescription       Expected Outcomes  Short Term: Able to explain program exercise prescription;Long Term: Able to explain home exercise prescription to exercise independently          Exercise Goals Re-Evaluation :   Discharge Exercise Prescription (Final Exercise Prescription Changes):   Nutrition:  Target Goals: Understanding of nutrition guidelines, daily intake of sodium 1500mg , cholesterol 200mg , calories 30% from fat and 7% or less from saturated fats, daily to have 5 or more servings of fruits and vegetables.  Biometrics:    Nutrition Therapy Plan and Nutrition Goals: Nutrition Therapy & Goals - 08/14/17 1356      Nutrition Therapy   Diet  heart healthy      Personal Nutrition Goals   Nutrition Goal  identify and limit food sources of sodium    Personal Goal #2  identify food quantities necessary to achieve we loss of 6-24 lbs    Personal Goal #3  describe the benefit of including fruits, vegetables, whole grains, low fat dairy in a healthy meal plan      Intervention Plan   Intervention  Prescribe, educate and counsel regarding individualized specific dietary modifications aiming towards targeted core components such as weight, hypertension, lipid management, diabetes, heart failure and other comorbidities.    Expected Outcomes  Short Term Goal: Understand basic principles of dietary content, such as calories, fat, sodium, cholesterol and nutrients.       Nutrition Assessments: Nutrition Assessments - 08/14/17 1400      Rate Your Plate Scores   Pre Score  40       Nutrition Goals  Re-Evaluation:   Nutrition Goals Discharge (Final Nutrition Goals Re-Evaluation):   Psychosocial: Target Goals: Acknowledge presence or absence of significant depression and/or stress, maximize coping skills, provide  positive support system. Participant is able to verbalize types and ability to use techniques and skills needed for reducing stress and depression.  Initial Review & Psychosocial Screening: Initial Psych Review & Screening - 08/14/17 1025      Initial Review   Current issues with  None Identified      Family Dynamics   Good Support System?  Yes    Comments  supportive wife and children      Barriers   Psychosocial barriers to participate in program  There are no identifiable barriers or psychosocial needs.      Screening Interventions   Interventions  Encouraged to exercise    Expected Outcomes  Short Term goal: Identification and review with participant of any Quality of Life or Depression concerns found by scoring the questionnaire.;Long Term goal: The participant improves quality of Life and PHQ9 Scores as seen by post scores and/or verbalization of changes       Quality of Life Scores:  Scores of 19 and below usually indicate a poorer quality of life in these areas.  A difference of  2-3 points is a clinically meaningful difference.  A difference of 2-3 points in the total score of the Quality of Life Index has been associated with significant improvement in overall quality of life, self-image, physical symptoms, and general health in studies assessing change in quality of life.  PHQ-9: Recent Review Flowsheet Data    Depression screen [facility] 2/9 08/14/2017   Decreased Interest 0   Down, Depressed, Hopeless 0   PHQ - 2 Score 0   Altered sleeping 0   Tired, decreased energy 0   Change in appetite 0   Feeling bad or failure about yourself  0   Trouble concentrating 0   Moving slowly or fidgety/restless 0   Suicidal thoughts 0   PHQ-9 Score 0   Difficult doing work/chores Not difficult at all     Interpretation of Total Score  Total Score Depression Severity:  1-4 = Minimal depression, 5-9 = Mild depression, 10-14 = Moderate depression, 15-19 = Moderately severe depression,  20-27 = Severe depression   Psychosocial Evaluation and Intervention: Psychosocial Evaluation - 08/14/17 1527      Psychosocial Evaluation & Interventions   Comments  no Psychosocial Barriers    Expected Outcomes  Pt continue to rely on his fatih for mental well being.       Psychosocial Re-Evaluation:   Psychosocial Discharge (Final Psychosocial Re-Evaluation):   Education: Education Goals: Education classes will be provided on a weekly basis, covering required topics. Participant will state understanding/return demonstration of topics presented.  Learning Barriers/Preferences: Learning Barriers/Preferences - 08/14/17 1027      Learning Barriers/Preferences   Learning Barriers  Sight    Learning Preferences  Computer/Internet;Group Instruction;Individual Instruction;Verbal Instruction;Video;Written Material       Education Topics: Risk Factor Reduction:  -Group instruction that is supported by a PowerPoint presentation. Instructor discusses the definition of a risk factor, different risk factors for pulmonary disease, and how the heart and lungs work together.     Nutrition for Pulmonary Patient:  -Group instruction provided by PowerPoint slides, verbal discussion, and written materials to support subject matter. The instructor gives an explanation and review of healthy diet recommendations, which includes a discussion on weight management, recommendations for fruit and vegetable consumption, as  well as protein, fluid, caffeine, fiber, sodium, sugar, and alcohol. Tips for eating when patients are short of breath are discussed.   Pursed Lip Breathing:  -Group instruction that is supported by demonstration and informational handouts. Instructor discusses the benefits of pursed lip and diaphragmatic breathing and detailed demonstration on how to preform both.     Oxygen Safety:  -Group instruction provided by PowerPoint, verbal discussion, and written material to support  subject matter. There is an overview of "What is Oxygen" and "Why do we need it".  Instructor also reviews how to create a safe environment for oxygen use, the importance of using oxygen as prescribed, and the risks of noncompliance. There is a brief discussion on traveling with oxygen and resources the patient may utilize.   Oxygen Equipment:  -Group instruction provided by [facility] Staff utilizing handouts, written materials, and equipment demonstrations.   Signs and Symptoms:  -Group instruction provided by written material and verbal discussion to support subject matter. Warning signs and symptoms of infection, stroke, and heart attack are reviewed and when to call the physician/911 reinforced. Tips for preventing the spread of infection discussed.   Advanced Directives:  -Group instruction provided by verbal instruction and written material to support subject matter. Instructor reviews Advanced Directive laws and proper instruction for filling out document.   Pulmonary Video:  -Group video education that reviews the importance of medication and oxygen compliance, exercise, good nutrition, pulmonary hygiene, and pursed lip and diaphragmatic breathing for the pulmonary patient.   Exercise for the Pulmonary Patient:  -Group instruction that is supported by a PowerPoint presentation. Instructor discusses benefits of exercise, core components of exercise, frequency, duration, and intensity of an exercise routine, importance of utilizing pulse oximetry during exercise, safety while exercising, and options of places to exercise outside of rehab.     Pulmonary Medications:  -Verbally interactive group education provided by instructor with focus on inhaled medications and proper administration.   Anatomy and Physiology of the Respiratory System and Intimacy:  -Group instruction provided by PowerPoint, verbal discussion, and written material to support subject matter. Instructor reviews  respiratory cycle and anatomical components of the respiratory system and their functions. Instructor also reviews differences in obstructive and restrictive respiratory diseases with examples of each. Intimacy, Sex, and Sexuality differences are reviewed with a discussion on how relationships can change when diagnosed with pulmonary disease. Common sexual concerns are reviewed.   MD DAY -A group question and answer session with a medical doctor that allows participants to ask questions that relate to their pulmonary disease state.   OTHER EDUCATION -Group or individual verbal, written, or video instructions that support the educational goals of the pulmonary rehab program.   Holiday Eating Survival Tips:  -Group instruction provided by PowerPoint slides, verbal discussion, and written materials to support subject matter. The instructor gives patients tips, tricks, and techniques to help them not only survive but enjoy the holidays despite the onslaught of food that accompanies the holidays.   Knowledge Questionnaire Score: Knowledge Questionnaire Score - 08/21/17 1144      Knowledge Questionnaire Score   Pre Score  16/18       Core Components/Risk Factors/Patient Goals at Admission: Personal Goals and Risk Factors at Admission - 08/14/17 1029      Core Components/Risk Factors/Patient Goals on Admission    Weight Management  Weight Loss    Admit Weight  225 lb 15.5 oz (102.5 kg)    Goal Weight: Short Term  215 lb (97.5  kg)    Goal Weight: Long Term  200 lb (90.7 kg)    Expected Outcomes  Short Term: Continue to assess and modify interventions until short term weight is achieved;Weight Loss: Understanding of general recommendations for a balanced deficit meal plan, which promotes 1-2 lb weight loss per week and includes a negative energy balance of [phone] kcal/d;Understanding of distribution of calorie intake throughout the day with the consumption of 4-5 meals/snacks;Understanding  recommendations for meals to include 15-35% energy as protein, 25-35% energy from fat, 35-60% energy from carbohydrates, less than 200mg  of dietary cholesterol, 20-35 gm of total fiber daily    Improve shortness of breath with ADL's  Yes    Intervention  Provide education, individualized exercise plan and daily activity instruction to help decrease symptoms of SOB with activities of daily living.    Expected Outcomes  Short Term: Improve cardiorespiratory fitness to achieve a reduction of symptoms when performing ADLs;Long Term: Be able to perform more ADLs without symptoms or delay the onset of symptoms    Heart Failure  Yes    Intervention  Provide a combined exercise and nutrition program that is supplemented with education, support and counseling about heart failure. Directed toward relieving symptoms such as shortness of breath, decreased exercise tolerance, and extremity edema.    Expected Outcomes  Improve functional capacity of life;Short term: Attendance in program 2-3 days a week with increased exercise capacity. Reported lower sodium intake. Reported increased fruit and vegetable intake. Reports medication compliance.;Short term: Daily weights obtained and reported for increase. Utilizing diuretic protocols set by physician.;Long term: Adoption of self-care skills and reduction of barriers for early signs and symptoms recognition and intervention leading to self-care maintenance.    Hypertension  Yes    Intervention  Provide education on lifestyle modifcations including regular physical activity/exercise, weight management, moderate sodium restriction and increased consumption of fresh fruit, vegetables, and low fat dairy, alcohol moderation, and smoking cessation.;Monitor prescription use compliance.    Expected Outcomes  Short Term: Continued assessment and intervention until BP is < 140/58mm HG in hypertensive participants. < 130/45mm HG in hypertensive participants with diabetes, heart  failure or chronic kidney disease.;Long Term: Maintenance of blood pressure at goal levels.       Core Components/Risk Factors/Patient Goals Review:    Core Components/Risk Factors/Patient Goals at Discharge (Final Review):    ITP Comments: ITP Comments    Row Name 08/14/17 1002           ITP Comments  Dr. Jennet Maduro,  Medical Director          Comments:

## 2017-08-29 ENCOUNTER — Encounter (HOSPITAL_COMMUNITY)
Admission: RE | Admit: 2017-08-29 | Discharge: 2017-08-29 | Disposition: A | Discharge status: Home / Self Care | Payer: 59 | Source: Ambulatory Visit | Attending: Pulmonary Disease | Admitting: Pulmonary Disease

## 2017-08-29 VITALS — Wt 222.0 lb

## 2017-08-29 DIAGNOSIS — J9602 Acute respiratory failure with hypercapnia: Principal | ICD-10-CM

## 2017-08-29 DIAGNOSIS — J9601 Acute respiratory failure with hypoxia: Secondary | ICD-10-CM | POA: Diagnosis not present

## 2017-08-29 NOTE — Progress Notes (Signed)
Daily Session Note  Patient Details  Name: Pawel R Cervenka MRN: [id number] Date of Birth: 10/13/1955 Referring Provider:     Pulmonary Rehab Walk Test from 08/22/2017 in [facility]  Referring Provider  Dr. Alva      Encounter Date: 08/29/2017  Check In: Session Check In - 08/29/17 1351      Check-In   Supervising physician immediately available to respond to emergencies  [facility] immediately available    Physician(s)  Dr. Powell    Location  [facility]    Staff Present  Molly DiVincenzo, MS, ACSM RCEP, Exercise Physiologist;Carlette Carlton, RN, BSN;Annedrea Stackhouse, RN, MHA    Medication changes reported      No    Fall or balance concerns reported     No    Tobacco Cessation  No Change    Warm-up and Cool-down  Performed as group-led instruction    Resistance Training Performed  Yes    VAD Patient?  No    PAD/SET Patient?  No      Pain Assessment   Currently in Pain?  No/denies    Multiple Pain Sites  No       Capillary Blood Glucose: No results found for this or any previous visit (from the past 24 hour(s)).    Social History   Tobacco Use  Smoking Status Never Smoker  Smokeless Tobacco Never Used    Goals Met:  Exercise tolerated well  Goals Unmet:  Not Applicable  Comments: Service time is from 1:30p to 3:45p    Dr. Wesam G. Yacoub is Medical Director for Pulmonary Rehab at [facility]. 

## 2017-09-03 ENCOUNTER — Other Ambulatory Visit: Payer: Self-pay

## 2017-09-03 ENCOUNTER — Telehealth: Payer: Self-pay | Admitting: Cardiology

## 2017-09-03 ENCOUNTER — Encounter (HOSPITAL_COMMUNITY)
Admission: RE | Admit: 2017-09-03 | Discharge: 2017-09-03 | Disposition: A | Discharge status: Home / Self Care | Payer: 59 | Source: Ambulatory Visit | Attending: Pulmonary Disease | Admitting: Pulmonary Disease

## 2017-09-03 VITALS — Wt 222.0 lb

## 2017-09-03 DIAGNOSIS — J9601 Acute respiratory failure with hypoxia: Secondary | ICD-10-CM

## 2017-09-03 DIAGNOSIS — I5189 Other ill-defined heart diseases: Secondary | ICD-10-CM | POA: Insufficient documentation

## 2017-09-03 DIAGNOSIS — J9602 Acute respiratory failure with hypercapnia: Secondary | ICD-10-CM | POA: Diagnosis not present

## 2017-09-03 NOTE — Telephone Encounter (Signed)
Pt was in Pulmonary rehab and had some chest pressure, with belching it comes and goes.  He had eaten and was rushing to get to rehab.  EKG without changes.  To see Dr. Claiborne Billings on the 19th.  instructed if symptoms continued to call for earlier appt but sounds more GI. Will notify Dr. Claiborne Billings.

## 2017-09-03 NOTE — Telephone Encounter (Signed)
agree

## 2017-09-03 NOTE — Progress Notes (Signed)
Pt in today for pulmonary rehab.  During the monthly announcements pt began feeling chest tightness in the center area.  Pt rates the discomfort 1-2/10.  BP 142/80.  Pt placed on monitor. Quick assessment reveals busy morning at the church for [place].  Pt was thinking he was going to be late for exercise.  Pt ate a couple of pieces of ham and water on the way to exercise which he ate hurriedly.  Pt also had a breakfast bar and coffee this morning for breakfast.  Pt with no weight gain from previous exercise sessions. Lungs clear.  Pt also complains of right under the arm discomfort.  Pt placed on oxygen therapy 3 LNC and EKG called.  Ekg completed. Called Trish - [employer]master.  Advised to call Mickel Baas.  Mickel Baas paged.  Await response.  Pt indicated that the pain had subsided with a belch but returned in waves. Received return call from [facility].  Reviewed pt symptoms and 12 lead ekg.  No further treatment or intervention warranted. Pt instructed should this pain return or intensify to please call 911. Pt should keep his upcoming appt on 9/18 with Dr. Claiborne Billings.  Pt verbalized understanding and felt the discomfort he had was GI related and he felt better after belching.  Pt verbalized understanding and is in agreement of the plan.  Pt to return to exercise on Thursday. Cherre Huger, BSN Cardiac and Training and development officer

## 2017-09-05 ENCOUNTER — Encounter (HOSPITAL_COMMUNITY)
Admission: RE | Admit: 2017-09-05 | Discharge: 2017-09-05 | Disposition: A | Discharge status: Home / Self Care | Payer: 59 | Source: Ambulatory Visit | Attending: Pulmonary Disease | Admitting: Pulmonary Disease

## 2017-09-05 DIAGNOSIS — J9602 Acute respiratory failure with hypercapnia: Principal | ICD-10-CM

## 2017-09-05 DIAGNOSIS — J9601 Acute respiratory failure with hypoxia: Secondary | ICD-10-CM

## 2017-09-05 NOTE — Progress Notes (Signed)
Daily Session Note  Patient Details  Name: Louis [name] MRN: [id number] Date of Birth: April 13, 1955 Referring Provider:     Pulmonary Rehab Walk Test from 08/22/2017 in [place]  Referring Provider  [name]      Encounter Date: 09/05/2017  Check In: Session Check In - 09/05/17 1515      Check-In   Supervising physician immediately available to respond to emergencies  [facility] immediately available    Physician(s)  Dr. Lars Mage     Location  [facility]    Staff Present  Rodney Langton, RN;Joann Rion, RN, BSN;Carlette Wilber Oliphant, RN, BSN;Ramon Dredge, RN, MHA    Medication changes reported      No    Fall or balance concerns reported     No    Tobacco Cessation  No Change    Warm-up and Cool-down  Performed as group-led instruction    Resistance Training Performed  Yes    VAD Patient?  No    PAD/SET Patient?  No      Pain Assessment   Currently in Pain?  No/denies       Capillary Blood Glucose: No results found for this or any previous visit (from the past 24 hour(s)).    Social History   Tobacco Use  Smoking Status Never Smoker  Smokeless Tobacco Never Used    Goals Met:  Exercise tolerated well No report of cardiac concerns or symptoms Strength training completed today  Goals Unmet:  Not Applicable  Comments: Service time is from 1330 to 1505    [name] is Medical Director for Pulmonary Rehab at [facility], [facility], [facility].

## 2017-09-10 ENCOUNTER — Encounter (HOSPITAL_COMMUNITY)
Admission: RE | Admit: 2017-09-10 | Discharge: 2017-09-10 | Disposition: A | Discharge status: Home / Self Care | Payer: 59 | Source: Ambulatory Visit | Attending: Pulmonary Disease | Admitting: Pulmonary Disease

## 2017-09-10 DIAGNOSIS — J9601 Acute respiratory failure with hypoxia: Secondary | ICD-10-CM

## 2017-09-10 DIAGNOSIS — J9602 Acute respiratory failure with hypercapnia: Principal | ICD-10-CM

## 2017-09-10 NOTE — Progress Notes (Signed)
Daily Session Note  Patient Details  Name: Louis [name] MRN: [id number] Date of Birth: [birth year] Referring Provider:     Pulmonary Rehab Walk Test from 08/22/2017 in [place]  Referring Provider  [name]      Encounter Date: 09/10/2017  Check In: Session Check In - 09/10/17 1540      Check-In   Supervising physician immediately available to respond to emergencies  [facility] immediately available    Physician(s)  Dr. Darrick Meigs    Location  [facility]    Staff Present  Maurice Small, RN, BSN;Molly DiVincenzo, MS, ACSM RCEP, Exercise Physiologist;Montrel Donahoe Ysidro Evert, Felipe Drone, RN, MHA    Medication changes reported      No    Fall or balance concerns reported     No    Tobacco Cessation  No Change    Warm-up and Cool-down  Performed as group-led instruction    Resistance Training Performed  Yes    VAD Patient?  No    PAD/SET Patient?  No      Pain Assessment   Currently in Pain?  No/denies    Pain Score  0-No pain    Multiple Pain Sites  No       Capillary Blood Glucose: No results found for this or any previous visit (from the past 24 hour(s)).    Social History   Tobacco Use  Smoking Status Never Smoker  Smokeless Tobacco Never Used    Goals Met:  Exercise tolerated well No report of cardiac concerns or symptoms Strength training completed today  Goals Unmet:  Not Applicable  Comments: Service time is from 1330 to 1530    [name] is Medical Director for Pulmonary Rehab at [facility]

## 2017-09-12 ENCOUNTER — Encounter (HOSPITAL_COMMUNITY)
Admission: RE | Admit: 2017-09-12 | Discharge: 2017-09-12 | Disposition: A | Discharge status: Home / Self Care | Payer: 59 | Source: Ambulatory Visit | Attending: Pulmonary Disease | Admitting: Pulmonary Disease

## 2017-09-12 DIAGNOSIS — J9601 Acute respiratory failure with hypoxia: Secondary | ICD-10-CM | POA: Diagnosis not present

## 2017-09-12 DIAGNOSIS — J9602 Acute respiratory failure with hypercapnia: Principal | ICD-10-CM

## 2017-09-12 NOTE — Progress Notes (Signed)
Daily Session Note  Patient Details  Name: Louis [name] MRN: [id number] Date of Birth: [birth year] Referring Provider:     Pulmonary Rehab Walk Test from 08/22/2017 in [place]  Referring Provider  [name]      Encounter Date: 09/12/2017  Check In: Session Check In - 09/12/17 1521      Check-In   Supervising physician immediately available to respond to emergencies  [facility] immediately available    Physician(s)  Dr. Dyann Kief    Location  [facility]    Staff Present  Maurice Small, RN, BSN;Molly DiVincenzo, MS, ACSM RCEP, Exercise Physiologist;Admiral Marcucci Ysidro Evert, Felipe Drone, RN, MHA    Medication changes reported      No    Fall or balance concerns reported     No    Tobacco Cessation  No Change    Warm-up and Cool-down  Performed as group-led instruction    Resistance Training Performed  Yes    VAD Patient?  No    PAD/SET Patient?  No      Pain Assessment   Currently in Pain?  No/denies    Pain Score  0-No pain    Multiple Pain Sites  No       Capillary Blood Glucose: No results found for this or any previous visit (from the past 24 hour(s)).    Social History   Tobacco Use  Smoking Status Never Smoker  Smokeless Tobacco Never Used    Goals Met:  Exercise tolerated well No report of cardiac concerns or symptoms Strength training completed today  Goals Unmet:  Not Applicable  Comments: Service time is from 1330 to 1500    [name] is Medical Director for Pulmonary Rehab at [facility].

## 2017-09-12 NOTE — Progress Notes (Signed)
Louis [name] 62 y.o. male   DOB: Aug 13, 1955 MRN: [id number]          Nutrition 1. Acute respiratory failure with hypoxia and hypercapnia (HCC)    Past Medical History:  Diagnosis Date  . BPH (benign prostatic hyperplasia)    on flomax  . Bundle branch block    per prior PCP records  . Diverticulosis    by colonoscopy  . Eczema    per prior PCP records  . Elevated PSA 2015   peaked 6s, s/p benign biopsy 2014, sees urology [name] ([place])  . Essential tremor   . GERD (gastroesophageal reflux disease)    per prior PCP records  . History of panic attacks    per prior PCP records  . HTN (hypertension)   . Mild intermittent asthma in adult without complication   . Obesity, Class I, BMI 30-34.9   . Osteoporosis    DEXA 06/2013 with osteopenia - h/o ?wrist/hip fracture from AVN from chronic steroid use (asthma), took reclast for 1 year  . Renal artery stenosis in 1 of 2 vessels ([place]) 2011   by [place]  . Seasonal allergies   . Thoracic scoliosis childhood  . Transaminitis    per prior PCP records     Meds reviewed.   Current Outpatient Medications (Cardiovascular):  .  amLODipine (NORVASC) 5 MG tablet, Take 2.5 mg by mouth daily. Pt takes 1/2 tablet daily .  furosemide (LASIX) 20 MG tablet, Take 1-2 tablets (20-40 mg total) by mouth daily. (Patient taking differently: Take 20 mg by mouth daily. ) .  losartan (COZAAR) 100 MG tablet, TAKE ONE TABLET BY MOUTH ONE TIME DAILY  .  metoprolol succinate (TOPROL-XL) 50 MG 24 hr tablet, Take 3 tablets (150 mg total) by mouth daily. .  sildenafil (REVATIO) 20 MG tablet, Take 20 mg by mouth 3 (three) times daily as needed.  Current Outpatient Medications (Respiratory):  .  albuterol (PROVENTIL HFA;VENTOLIN HFA) 108 (90 BASE) MCG/ACT inhaler, Inhale 2 puffs into the lungs every 4 (four) hours as needed for wheezing or shortness of breath. .  budesonide-formoterol (SYMBICORT) 160-4.5 MCG/ACT inhaler, Inhale 2 puffs into the lungs 2 (two) times  daily for 1 day.    Current Outpatient Medications (Other):  [name]  ALPRAZolam (XANAX) 0.5 MG tablet, Take 1 tablet (0.5 mg total) by mouth at bedtime as needed for sleep. .  urea (CARMOL) 40 % CREA, apply to affected area twice a day for THICKEST AREAS if needed for dry skin .  Vitamin D, Ergocalciferol, (DRISDOL) 50000 UNITS CAPS capsule, Take 50,000 Units by mouth every 7 (seven) days.  Ht: Ht Readings from Last 1 Encounters:  08/15/17 5' 6.73" (1.695 m)     Wt:  Wt Readings from Last 3 Encounters:  09/03/17 222 lb 0.1 oz (100.7 kg)  08/29/17 222 lb 0.1 oz (100.7 kg)  08/15/17 223 lb 12.8 oz (101.5 kg)     BMI: 35.5    Current tobacco use? No  Labs:  Lipid Panel     Component Value Date/Time   CHOL 214 (H) 09/26/2015 1328   CHOL 204 10/11/2014   TRIG 60.0 09/26/2015 1328   TRIG 51 10/11/2014   HDL 100.60 09/26/2015 1328   HDL 75 10/11/2014   CHOLHDL 2 09/26/2015 1328   VLDL 12.0 09/26/2015 1328   LDLCALC 101 (H) 09/26/2015 1328   LDLCALC 119 10/11/2014    No results found for: HGBA1C Note Spoke with pt. Pt is obese.  Pt  eats 3 meals a day and 1 snack; most prepared at home. He is making healthy food choices the majority of the time.  Pt's Rate Your Plate results reviewed with pt. Pt does not avoid salty food; uses canned/ convenience food.  Pt does not add salt to food, has started cooking with spices to add flavor to foods.  The role of sodium in lung disease reviewed with pt. Pt would like to lose weight, has started cooking more at home. Additional weight loss tips discussed (eating frequently across the day, hydration, portion sizes, label reading). Pt expressed understanding of the information reviewed.    Nutrition Diagnosis  ? Overweight/obesity related to excessive energy intake as evidenced by a BMI of 35.5   Nutrition Intervention ? Pt's individual nutrition plan and goals reviewed with pt. ? Benefits of adopting healthy eating habits discussed when pt's  Rate Your Plate reviewed.  Goal(s) 1. Pt to identify and limit food sources of sodium, saturated fat, trans fat, and refined carbohydrates 2. The pt will consume high-energy, high-nutrient dense beverages when necessary to compensate for decreased oral intake of solid foods. 3. The pt will have family and friends shop for food when necessary so that nourishing foods are always available at home. 4. Identify food quantities necessary to achieve wt loss of  -2# per week to a goal wt loss of 6-24 lb at graduation from pulmonary rehab.   Plan:  Pt to attend Pulmonary Nutrition class Will provide client-centered nutrition education as part of interdisciplinary care.    Monitor and Evaluate progress toward nutrition goal with team.   Laurina Bustle, MS, RD, LDN 09/12/2017 3:39 PM

## 2017-09-13 ENCOUNTER — Other Ambulatory Visit: Payer: Self-pay

## 2017-09-13 MED ORDER — BUDESONIDE-FORMOTEROL FUMARATE 160-4.5 MCG/ACT IN AERO: 2.0000 | INHALATION_SPRAY | Freq: Two times a day (BID) | RESPIRATORY_TRACT | 5 refills | Status: DC

## 2017-09-16 ENCOUNTER — Ambulatory Visit: Payer: 59 | Admitting: Pulmonary Disease

## 2017-09-17 ENCOUNTER — Encounter (HOSPITAL_COMMUNITY)
Admission: RE | Admit: 2017-09-17 | Discharge: 2017-09-17 | Disposition: A | Discharge status: Home / Self Care | Payer: 59 | Source: Ambulatory Visit | Attending: Pulmonary Disease | Admitting: Pulmonary Disease

## 2017-09-17 VITALS — Wt 222.9 lb

## 2017-09-17 DIAGNOSIS — J9601 Acute respiratory failure with hypoxia: Secondary | ICD-10-CM

## 2017-09-17 DIAGNOSIS — J9602 Acute respiratory failure with hypercapnia: Principal | ICD-10-CM

## 2017-09-17 NOTE — Progress Notes (Signed)
Daily Session Note  Patient Details  Name: Louis [name] MRN: [id number] Date of Birth: 08/03/1955 Referring Provider:     Pulmonary Rehab Walk Test from 08/22/2017 in [place]  Referring Provider  [name]      Encounter Date: 09/17/2017  Check In: Session Check In - 09/17/17 1540      Check-In   Supervising physician immediately available to respond to emergencies  [facility] immediately available    Physician(s)  Dr. Sloan Leiter    Location  [facility]    Staff Present  Maurice Small, RN, BSN;Molly DiVincenzo, MS, ACSM RCEP, Exercise Physiologist;Carletha Dawn Ysidro Evert, Felipe Drone, RN, MHA    Medication changes reported      No    Fall or balance concerns reported     No    Tobacco Cessation  No Change    Warm-up and Cool-down  Performed as group-led instruction    Resistance Training Performed  Yes    VAD Patient?  No    PAD/SET Patient?  No      Pain Assessment   Currently in Pain?  No/denies    Pain Score  0-No pain    Multiple Pain Sites  No       Capillary Blood Glucose: No results found for this or any previous visit (from the past 24 hour(s)).  Exercise Prescription Changes - 09/17/17 1600      Response to Exercise   Blood Pressure (Admit)  108/64    Blood Pressure (Exercise)  128/70    Blood Pressure (Exit)  102/60    Heart Rate (Admit)  75 bpm    Heart Rate (Exercise)  102 bpm    Heart Rate (Exit)  76 bpm    Oxygen Saturation (Admit)  91 %    Oxygen Saturation (Exercise)  92 %    Oxygen Saturation (Exit)  92 %    Rating of Perceived Exertion (Exercise)  13    Perceived Dyspnea (Exercise)  1    Duration  Continue with 45 min of aerobic exercise without signs/symptoms of physical distress.    Intensity  THRR unchanged      Progression   Progression  Continue to progress workloads to maintain intensity without signs/symptoms of physical distress.      Resistance Training   Training Prescription   Yes    Weight  blue bands    Reps  10-15    Time  10 Minutes      Recumbant Bike   Level  4    Watts  10    Minutes  17      NuStep   Level  5    SPM  80    Minutes  17    METs  2      Track   Laps  24    Minutes  17       Social History   Tobacco Use  Smoking Status Never Smoker  Smokeless Tobacco Never Used    Goals Met:  Exercise tolerated well No report of cardiac concerns or symptoms Strength training completed today  Goals Unmet:  Not Applicable  Comments: Service time is from 1330 to 1515    [name] is Medical Director for Pulmonary Rehab at [facility].

## 2017-09-18 ENCOUNTER — Encounter: Payer: Self-pay | Admitting: Cardiovascular Disease

## 2017-09-18 ENCOUNTER — Ambulatory Visit (INDEPENDENT_AMBULATORY_CARE_PROVIDER_SITE_OTHER): Payer: 59 | Admitting: Cardiovascular Disease

## 2017-09-18 ENCOUNTER — Encounter

## 2017-09-18 VITALS — BP 112/78 | HR 68 | Ht 71.0 in | Wt 224.0 lb

## 2017-09-18 DIAGNOSIS — Z79899 Other long term (current) drug therapy: Secondary | ICD-10-CM

## 2017-09-18 DIAGNOSIS — G4733 Obstructive sleep apnea (adult) (pediatric): Secondary | ICD-10-CM | POA: Diagnosis not present

## 2017-09-18 DIAGNOSIS — I10 Essential (primary) hypertension: Secondary | ICD-10-CM | POA: Diagnosis not present

## 2017-09-18 DIAGNOSIS — I5032 Chronic diastolic (congestive) heart failure: Secondary | ICD-10-CM | POA: Diagnosis not present

## 2017-09-18 MED ORDER — SPIRONOLACTONE 25 MG PO TABS: 12.5000 mg | ORAL_TABLET | Freq: Every day | ORAL | 3 refills | Status: DC

## 2017-09-18 NOTE — Progress Notes (Signed)
Cardiology Office Note    Date:  09/24/2017   ID:  Louis [name], Louis [name] 10/01/1955, MRN [id number]  PCP:  Ria Bush, MD  Cardiologist:  Shelva Majestic, MD   Cardiology consultation referred through the courtesy of Dr. Ria Bush for evaluation of diastolic dysfunction.  History of Present Illness:  Louis [name] is a 62 y.o. male who is the husband of Dr. Margaretha Glassing, a retired OB/GYN physician.  I had seen him in 2011.  He presents for cardiology evaluation referred by Dr. Danise Mina following recent hospitalization with complaints of increasing shortness of breath.  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  I had seen him in 2011 at which time he had a mild RV conduction delay and was hypertensive on a multiple medical regimen.  An echo Doppler study on February 18, 2009 showed normal LV size and thickness with an EF greater than 55%.  At that time he had normal diastolic parameters.  There was mild mitral regurgitation and trace tricuspid insufficiency.  He had told me at that time that he had a double collecting system to his kidneys.  Renal duplex imaging suggested mild right renal artery narrowing in the 1 to 59% range.  At the time, he was on medical therapy with Bystolic, amlodipine, and direct renin inhibition.    I have not seen him since 2011.  He was recently hospitalized following a trip to [place] in early July 2019.  He developed significant hypoxia and hypercarbia and required transient BiPAP due to severe oxygen desaturation to 61%.  He was not found to have a pulmonary embolism.  A CT angiogram showed severe scoliosis and suggestion of pulmonary hypertension.  He was felt to have prominence of the main pulmonary outflow track and lower lobe atelectatic changes.  There was evidence for hepatic steatosis as well as aortic atherosclerosis.  An echo Doppler study revealed normal systolic function with grade 2  diastolic dysfunction, decreased RV function with normal PA pressures.  He subsequently underwent a sleep study and was found to have severe sleep apnea with an AHI of 39/h for which CPAP was implemented with supplemental O2.  He is followed by [name] from a pulmonary standpoint.  Pulmonary function studies in August 2019 showed a postbronchodilator ratio at 71 with an FEV1 of 61% with FVC of 65% without significant buccal dilator response.  TLC was 90% with DLCO 96% suggestive of extraparenchymal restriction.  He is now undergoing pulmonary rehabilitation with some benefit.  He has been on amlodipine 2.5 mg, furosemide 20 mg, losartan 100 mg, Toprol-XL 150 mg daily for his blood pressure.  He continues to experience some shortness of breath.  He is unaware of palpitations.  He denies presyncope or syncope.  There is no history of tobacco use.  He presents for evaluation.  Past Medical History:  Diagnosis Date  . BPH (benign prostatic hyperplasia)    on flomax  . Bundle branch block    per prior PCP records  . Diverticulosis    by colonoscopy  . Eczema    per prior PCP records  . Elevated PSA 2015   peaked 6s, s/p benign biopsy 2014, sees urology [name] ([place])  . Essential tremor   . GERD (gastroesophageal reflux disease)    per prior PCP records  . History of panic attacks    per prior PCP records  . HTN (hypertension)   . Mild intermittent asthma  in adult without complication   . Obesity, Class I, BMI 30-34.9   . Osteoporosis    DEXA 06/2013 with osteopenia - h/o ?wrist/hip fracture from AVN from chronic steroid use (asthma), took reclast for 1 year  . Renal artery stenosis in 1 of 2 vessels ([place]) 2011   by [place]  . Seasonal allergies   . Thoracic scoliosis childhood  . Transaminitis    per prior PCP records    Past Surgical History:  Procedure Laterality Date  . COLONOSCOPY  12/2013   polyps, int hem, diverticulosis, rpt 5 yrs ([place])  . ELBOW SURGERY Right 2008   golfer's  elbow  . INGUINAL HERNIA REPAIR Bilateral childhood & ~1995  . NASAL SEPTUM SURGERY  2013   deviated septum  . US ECHOCARDIOGRAPHY  02/2009   WNL, EF >55% Claiborne Billings)  . US RENAL/AORTA Right 05/2009   1-59% diameter reduction renal artery, rec rpt 2 yrs    Current Medications: Outpatient Medications Prior to Visit  Medication Sig Dispense Refill  . ALPRAZolam (XANAX) 0.5 MG tablet Take 1 tablet (0.5 mg total) by mouth at bedtime as needed for sleep. 30 tablet 0  . amLODipine (NORVASC) 5 MG tablet Take 2.5 mg by mouth daily. Pt takes 1/2 tablet daily    . budesonide-formoterol (SYMBICORT) 160-4.5 MCG/ACT inhaler Inhale 2 puffs into the lungs 2 (two) times daily. 1 Inhaler 5  . losartan (COZAAR) 100 MG tablet TAKE ONE TABLET BY MOUTH ONE TIME DAILY  90 tablet 2  . metoprolol succinate (TOPROL-XL) 50 MG 24 hr tablet Take 3 tablets (150 mg total) by mouth daily. 270 tablet 3  . sildenafil (REVATIO) 20 MG tablet Take 20 mg by mouth 3 (three) times daily as needed.    . urea (CARMOL) 40 % CREA apply to affected area twice a day for THICKEST AREAS if needed for dry skin  0  . Vitamin D, Ergocalciferol, (DRISDOL) 50000 UNITS CAPS capsule Take 50,000 Units by mouth every 7 (seven) days.    . furosemide (LASIX) 20 MG tablet Take 20 mg by mouth daily.    [name] albuterol (PROVENTIL HFA;VENTOLIN HFA) 108 (90 BASE) MCG/ACT inhaler Inhale 2 puffs into the lungs every 4 (four) hours as needed for wheezing or shortness of breath.    . furosemide (LASIX) 20 MG tablet Take 1-2 tablets (20-40 mg total) by mouth daily. (Patient taking differently: Take 20 mg by mouth daily. ) 60 tablet 3   No facility-administered medications prior to visit.      Allergies:   Accupril [quinapril hcl]   Social History   Socioeconomic History  . Marital status: Married    Spouse name: Not on file  . Number of children: Not on file  . Years of education: Not on file  . Highest education level: Not on file  Occupational History    . Not on file  Social Needs  . Financial resource strain: Not on file  . Food insecurity:    Worry: Not on file    Inability: Not on file  . Transportation needs:    Medical: Not on file    Non-medical: Not on file  Tobacco Use  . Smoking status: Never Smoker  . Smokeless tobacco: Never Used  Substance and Sexual Activity  . Alcohol use: Yes    Alcohol/week: 0.0 standard drinks    Comment: Social  . Drug use: No  . Sexual activity: Yes    Birth control/protection: None  Lifestyle  . Physical activity:  Days per week: Not on file    Minutes per session: Not on file  . Stress: Not on file  Relationships  . Social connections:    Talks on phone: Not on file    Gets together: Not on file    Attends religious service: Not on file    Active member of club or organization: Not on file    Attends meetings of clubs or organizations: Not on file    Relationship status: Not on file  Other Topics Concern  . 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  He is a Theme park manager.  There is no tobacco history.  He has 2 children Augus who is 61 and lives in [place] and [place] age 8 who has a Oceanographer in Estate manager/land agent.  Family History:  The patient's family history includes Alcohol abuse in his mother; Ataxia in his brother and sister; Cancer in his maternal grandmother; Cancer (age of onset: 28) in his father; Diabetes in his mother; Prostate cancer in his father; Stroke in his mother.  His father had colon cancer, paternal grandmother had pancreatic cancer.  His father's mother suffered a heart attack in her 67s.  2 of his siblings have cerebellar ataxia.  ROS General: Negative; No fevers, chills, or night sweats;  HEENT:  Negative; No changes in vision or hearing, sinus congestion, difficulty swallowing Pulmonary: Positive for recent hospitalization with hypoxia and hypercarbic respiratory failure. Cardiovascular: Negative; No chest pain, presyncope, syncope, palpitations GI: Negative; No nausea, vomiting, diarrhea, or abdominal pain GU: Negative; No dysuria, hematuria, or difficulty voiding Musculoskeletal: Severe scoliosis.  History of osteoporosis Hematologic/Oncology: Negative; no easy bruising, bleeding Endocrine: Negative; no heat/cold intolerance; no diabetes Neuro: Negative; no changes in balance, headaches Skin: Negative; No rashes or skin lesions Psychiatric: Negative; No behavioral problems, depression Sleep: Positive for recently diagnosed severe obstructive sleep apnea, currently on CPAP at 9 with 2 L supplemental oxygen; no bruxism, restless legs, hypnogognic hallucinations, no cataplexy Other comprehensive 14 point system review is negative.   PHYSICAL EXAM:   VS:  BP 112/78   Pulse 68   Ht 5' 11"  (1.803 m)   Wt 224 lb (101.6 kg)   BMI 31.24 kg/m     Repeat blood pressure by me was 122/78 supine and 126/76 standing  Wt Readings from Last 3 Encounters:  09/19/17 223 lb (101.2 kg)  09/18/17 224 lb (101.6 kg)  09/17/17 222 lb 14.2 oz (101.1 kg)    General: Alert, oriented, no distress.  Skin: normal turgor, no rashes, warm and dry HEENT: Normocephalic, atraumatic. Pupils equal round and reactive to light; sclera anicteric; extraocular muscles intact; Fundi without hemorrhages or exudates.  Discs flat Nose without nasal septal hypertrophy Mouth/Parynx benign; Mallinpatti scale 3 Neck: No JVD, no carotid bruits; normal carotid upstroke Lungs: clear to ausculatation and percussion; no wheezing or rales Chest wall: without tenderness to palpitation Heart: PMI not displaced, RRR, s1 s2 normal, 1/6 systolic murmur, no diastolic murmur, no rubs, gallops, thrills, or heaves Abdomen: soft,  nontender; no hepatosplenomehaly, BS+; abdominal aorta nontender and not dilated by palpation. Back: no CVA tenderness; severe scoliosis Pulses 2+ Musculoskeletal: full range of motion, normal strength, no  joint deformities Extremities: no clubbing cyanosis or edema, Homan's sign negative  Neurologic: grossly nonfocal; Cranial nerves grossly wnl Psychologic: Normal mood and affect   Studies/Labs Reviewed:   EKG:  EKG is ordered today.  ECG (independently read by me): Normal sinus rhythm at 68 bpm.  Borderline LA enlargement.  Nonspecific T changes.  Normal intervals.  No ectopy.  Recent Labs: BMP Latest Ref Rng & Units 07/08/2017 07/04/2017 07/03/2017  Glucose 70 - 99 mg/dL 108(H) 105(H) 110(H)  BUN 6 - 23 mg/dL 16 18 19   Creatinine 0.40 - 1.50 mg/dL 0.78 0.86 1.04  Sodium 135 - 145 mEq/L 136 136 139  Potassium 3.5 - 5.1 mEq/L 4.5 4.1 4.5  Chloride 96 - 112 mEq/L 89(L) 92(L) 96(L)  CO2 19 - 32 mEq/L 41(H) 33(H) 35(H)  Calcium 8.4 - 10.5 mg/dL 9.4 8.9 9.2     Hepatic Function Latest Ref Rng & Units 07/02/2017 07/30/2016 09/26/2015  Total Protein 6.5 - 8.1 g/dL 7.5 7.6 8.0  Albumin 3.5 - 5.0 g/dL 3.9 4.6 4.5  AST 15 - 41 U/L 22 61(H) 36  ALT 0 - 44 U/L 38 59(H) 36  Alk Phosphatase 38 - 126 U/L 82 70 66  Total Bilirubin 0.3 - 1.2 mg/dL 0.7 0.9 0.8    CBC Latest Ref Rng & Units 07/04/2017 07/03/2017 07/02/2017  WBC 4.0 - 10.5 K/uL 7.2 5.5 6.5  Hemoglobin 13.0 - 17.0 g/dL 16.5 16.2 17.3(H)  Hematocrit 39.0 - 52.0 % 52.6(H) 52.1(H) 54.8(H)  Platelets 150 - 400 K/uL 237 232 222   Lab Results  Component Value Date   MCV 104.8 (H) 07/04/2017   MCV 106.8 (H) 07/03/2017   MCV 105.0 (H) 07/02/2017   Lab Results  Component Value Date   TSH 0.940 07/02/2017   No results found for: HGBA1C   BNP    Component Value Date/Time   BNP 232.6 (H) 07/02/2017 1648    ProBNP No results found for: PROBNP   Lipid Panel     Component Value Date/Time   CHOL 214 (H) 09/26/2015 1328   CHOL 204  10/11/2014   TRIG 60.0 09/26/2015 1328   TRIG 51 10/11/2014   HDL 100.60 09/26/2015 1328   HDL 75 10/11/2014   CHOLHDL 2 09/26/2015 1328   VLDL 12.0 09/26/2015 1328   LDLCALC 101 (H) 09/26/2015 1328   LDLCALC 119 10/11/2014     RADIOLOGY: No results found.   Additional studies/ records that were reviewed today include:  ------------------------------------------------------------------- 07/03/2017 ECHO Study Conclusions  - Left ventricle: The cavity size was normal. There was mild   concentric hypertrophy. Systolic function was normal. The   estimated ejection fraction was in the range of 60% to 65%. Wall   motion was normal; there were no regional wall motion   abnormalities. Features are consistent with a pseudonormal left   ventricular filling pattern, with concomitant abnormal relaxation   and increased filling pressure (grade 2 diastolic dysfunction).   There was no evidence of elevated ventricular filling pressure by   Doppler parameters. - Aortic valve: There was no regurgitation. - Mitral valve: There was no regurgitation. - Left atrium: The atrium was moderately dilated. - Right ventricle: The cavity size was moderately dilated. Wall   thickness was normal. Systolic function was mildly reduced. - Right atrium: The atrium was normal in size. - Pulmonic valve: There was trivial regurgitation. - Inferior vena cava: The vessel was normal in size. - Pericardium, extracardiac: A trivial pericardial effusion was   identified  posterior to the heart. Features were not consistent   with tamponade physiology.   07/02/2017  CT Angio Chest  MPRESSION: 1. No demonstrable pulmonary embolus. No thoracic aortic aneurysm or dissection. There is aortic and great vessel atherosclerosis.  2. Prominence of the main pulmonary outflow tract, a finding felt to be indicative of pulmonary arterial hypertension.  3. Lower lobe atelectatic change. Suspect early pneumonia in  each posterior lung base.  4.  No evident thoracic adenopathy.  5.  Hepatic steatosis.  Aortic Atherosclerosis (ICD10-I70.0).   ASSESSMENT:    1. Chronic heart failure with preserved ejection fraction ([place])   2. Essential hypertension   3. Medication management   4. 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  In 2011 an echo Doppler study showed normal systolic and diastolic function.  He was recently hospitalized following a trip to [place] with hypoxia and hyper cardiac respiratory failure requiring transient BiPAP therapy due to severe oxygen desaturation.  He was diuresed with Lasix.  BNP was 233.  He subsequently underwent pulmonary function studies as well as a sleep evaluation and has severe sleep apnea currently on CPAP therapy with supplemental oxygen.  I reviewed his hospitalization records.  His most recent echo Doppler study continues to show normal systolic function but now reveals grade 2 diastolic dysfunction.  His left atrium was moderately dilated as was his right ventricle with suggestion of possible mild RV systolic function reduction.  He had trivial pulmonic insufficiency.  A trivial pericardial effusion was identified not felt to be significant.  On exam today his blood pressure is stable on his current regimen consisting of amlodipine 2.5 mg, furosemide 20 mg, losartan 100 mg in addition to Toprol-XL 150 mg.  His ECG reveals normal sinus rhythm without ectopy.  He has continued to experience some shortness of breath.  He does not have any edema presently.  Since he does have grade 2 diastolic dysfunction without significant edema I have suggested discontinuance of furosemide and in its place initiate Spironolactone.  I will initially start at 12.5 mg daily and ultimately after several weeks if tolerated this can be increased to 25 mg daily if he notes  shortness of breath swelling or increased blood pressure.  He will undergo follow-up be met and BNP in 2 weeks.  He will continue to participate in pulmonary rehab.  He continues to be on Symbicort.  There was no wheezing on exam today.  He continues to use CPAP with 1% compliance.  I will see him in 3 months for follow-up evaluation.   Medication Adjustments/Labs and Tests Ordered: Current medicines are reviewed at length with the patient today.  Concerns regarding medicines are outlined above.  Medication changes, Labs and Tests ordered today are listed in the Patient Instructions below. Patient Instructions  Medication Instructions:  STOP furosemide (Lasix) START spironolactone 12.5 mg (1/2 tablet) daily  --after 2 weeks, if you notice shortness of breath, swelling, or increased blood pressure increase to 25 mg (1 tablet) daily  Labwork: Please return for labs in 2 weeks (BMET, BNP)  Our in office lab hours are Monday-Friday 8:00-4:00, closed for lunch 12:45-1:45 pm.  No appointment needed.  Follow-Up: 12/10 at 2pm with Dr. Claiborne Billings  Any Other Special Instructions Will Be Listed Below (If Applicable).     If you need a refill on your  cardiac medications before your next appointment, please call your pharmacy. '     Signed, Shelva Majestic, MD  09/24/2017 6:47 PM    [facility] [address] [place], [place],   [address] Phone: [phone]

## 2017-09-18 NOTE — Patient Instructions (Addendum)
Medication Instructions:  STOP furosemide (Lasix) START spironolactone 12.5 mg (1/2 tablet) daily  --after 2 weeks, if you notice shortness of breath, swelling, or increased blood pressure increase to 25 mg (1 tablet) daily  Labwork: Please return for labs in 2 weeks (BMET, BNP)  Our in office lab hours are Monday-Friday 8:00-4:00, closed for lunch 12:45-1:45 pm.  No appointment needed.  Follow-Up: 12/10 at 2pm with Dr. Claiborne Billings  Any Other Special Instructions Will Be Listed Below (If Applicable).     If you need a refill on your cardiac medications before your next appointment, please call your pharmacy. '

## 2017-09-19 ENCOUNTER — Encounter (HOSPITAL_COMMUNITY)
Admission: RE | Admit: 2017-09-19 | Discharge: 2017-09-19 | Disposition: A | Discharge status: Home / Self Care | Payer: 59 | Source: Ambulatory Visit | Attending: Pulmonary Disease | Admitting: Pulmonary Disease

## 2017-09-19 ENCOUNTER — Ambulatory Visit (INDEPENDENT_AMBULATORY_CARE_PROVIDER_SITE_OTHER): Payer: 59 | Admitting: Pulmonary Disease

## 2017-09-19 ENCOUNTER — Encounter: Payer: Self-pay | Admitting: Pulmonary Disease

## 2017-09-19 VITALS — BP 128/78 | HR 90 | Ht 71.0 in | Wt 223.0 lb

## 2017-09-19 DIAGNOSIS — J9602 Acute respiratory failure with hypercapnia: Principal | ICD-10-CM

## 2017-09-19 DIAGNOSIS — J4520 Mild intermittent asthma, uncomplicated: Secondary | ICD-10-CM | POA: Diagnosis not present

## 2017-09-19 DIAGNOSIS — Z23 Encounter for immunization: Secondary | ICD-10-CM

## 2017-09-19 DIAGNOSIS — G4733 Obstructive sleep apnea (adult) (pediatric): Secondary | ICD-10-CM | POA: Diagnosis not present

## 2017-09-19 DIAGNOSIS — J9601 Acute respiratory failure with hypoxia: Secondary | ICD-10-CM | POA: Diagnosis not present

## 2017-09-19 NOTE — Addendum Note (Signed)
Addended by: Elton Sin on: 09/19/2017 09:37 AM   Modules accepted: Orders

## 2017-09-19 NOTE — Patient Instructions (Signed)
Stay on Symbicort until next visit  High-dose flu shot today. Increase CPAP to 10 cm and recheck download in 1 month.  Check overnight oximetry on CPAP/room air in 2 weeks so we can advise you regarding oxygen for your trip to [place].  Okay to increase level of intensity on exercise.

## 2017-09-19 NOTE — Progress Notes (Signed)
Subjective:    [name], male    DOB: 1955/05/04, 62 y.o.   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 He is accompanied by his wife Louis [name] who is a OB/GYN in town  He   was hospitalized with hypoxia & hypercarbia  In 07/2017 after trip to [place] ABG was7.31/72/71 on 4 L oxygenand initial bicarbonate was 33 on admission, and 07/2016 bicarbonate was 27. He required transient BiPAP due to severe desaturation,was diuresed with Lasix from his admission weight of 239 to his discharge weight of 232 pounds.Antibiotics was initiated but then stopped since he did not have any leukocytosis. BNP was 233.  He has done remarkably well.  He is enrolled in pulmonary rehab for the last 3 weeks and is able to do 15 minutes of track, 15 minutes on a stepper.  Oxygen saturation stays above 90%. He has been compliant with Symbicort and this seems to have helped, no wheezing.  He has been using his CPAP and is settled down with air fit F 30 fullface mask, denies any problems with mask or pressure. CPAP download was reviewed which shows residual AHI of 6/hour on 9 cm with compliance more than 6 hours every night.  He feels well rested and denies naps  He was seen by cardiology for diastolic dysfunction and Aldactone was added, a 6 my opinion on this. He is compliant with oxygen blended into his machine but has weaned himself off oxygen in the daytime.  He has an upcoming 3-day trip to [place] in October and wonders if he needs to take oxygen with    Significant tests/ events reviewed  CT angiogram 07/2017>>severe scoliosis,suggestion of pulmonary hypertension. Echo 08/8414 grade 2 diastolic dysfunction, normal LV function, decreased RV function but normal PA pressures EKG-right axis deviation  NPSG AHI 39/h, >> CPAP 9 cm + 2L o2  PFTs 08/2017 >> postbronchodilator ratio 71,  FEV1 61% with FVC 65%, no significant broncho dilator response, TLC 90%, DLCO 96% suggestive extraparenchymal restriction    Past Medical History:  Diagnosis Date  . BPH (benign prostatic hyperplasia)    on flomax  . Bundle branch block    per prior PCP records  . Diverticulosis    by colonoscopy  . Eczema    per prior PCP records  . Elevated PSA 2015   peaked 6s, s/p benign biopsy 2014, sees urology [name] ([place])  . Essential tremor   . GERD (gastroesophageal reflux disease)    per prior PCP records  . History of panic attacks    per prior PCP records  . HTN (hypertension)   . Mild intermittent asthma in adult without complication   . Obesity, Class I, BMI 30-34.9   . Osteoporosis    DEXA 06/2013 with osteopenia - h/o ?wrist/hip fracture from AVN from chronic steroid use (asthma), took reclast for 1 year  . Renal artery stenosis in 1 of 2 vessels ([place]) 2011   by [place]  . Seasonal allergies   . Thoracic scoliosis childhood  . Transaminitis    per prior PCP records    Review of Systems neg for any significant sore throat, dysphagia, itching, sneezing, nasal congestion or excess/ purulent secretions, fever, chills, sweats, unintended wt loss, pleuritic or exertional cp, hempoptysis, orthopnea pnd or change in chronic leg swelling. Also denies presyncope, palpitations, heartburn, abdominal pain, nausea, vomiting, diarrhea or change in bowel or urinary habits,  dysuria,hematuria, rash, arthralgias, visual complaints, headache, numbness weakness or ataxia.     Objective:   Physical Exam  Gen. Pleasant, obese, in no distress ENT - no lesions, no post nasal drip Neck: No JVD, no thyromegaly, no carotid bruits Lungs: no use of accessory muscles, no dullness to percussion, decreased without rales or rhonchi  Cardiovascular: Rhythm regular, heart sounds  normal, no murmurs or gallops, no peripheral edema Musculoskeletal: No deformities, no cyanosis or clubbing , no  tremors       Assessment & Plan:

## 2017-09-19 NOTE — Assessment & Plan Note (Signed)
Increase CPAP to 10 cm and recheck download in 1 month.  Check overnight oximetry on CPAP/room air in 2 weeks so we can advise you regarding oxygen for your trip to [place].  He is very compliant with his machine and CPAP is certainly helped improve his daytime somnolence and fatigue

## 2017-09-19 NOTE — Assessment & Plan Note (Signed)
Stay on Symbicort until next visit  High-dose flu shot today.

## 2017-09-19 NOTE — Progress Notes (Signed)
Daily Session Note  Patient Details  Name: Louis [name] MRN: [id number] Date of Birth: 1955/01/12 Referring Provider:     Pulmonary Rehab Walk Test from 08/22/2017 in [facility]  Referring Provider  [name]      Encounter Date: 09/19/2017  Check In: Session Check In - 09/19/17 1554      Check-In   Supervising physician immediately available to respond to emergencies  [facility] immediately available    Physician(s)  Dr. Toma Copier    Location  [facility]    Staff Present  Maurice Small, RN, BSN;Justice Milliron, MS, ACSM RCEP, Exercise Physiologist;Lisa Ysidro Evert, RN    Medication changes reported      No    Fall or balance concerns reported     No    Tobacco Cessation  No Change    Warm-up and Cool-down  Performed as group-led instruction    Resistance Training Performed  Yes    VAD Patient?  No    PAD/SET Patient?  No      Pain Assessment   Currently in Pain?  No/denies    Pain Score  0-No pain    Multiple Pain Sites  No       Capillary Blood Glucose: No results found for this or any previous visit (from the past 24 hour(s)).    Social History   Tobacco Use  Smoking Status Never Smoker  Smokeless Tobacco Never Used    Goals Met:  Exercise tolerated well  Goals Unmet:  Not Applicable  Comments: Service time is from 1:30p to 3:25p    [name] is Medical Director for Pulmonary Rehab at [facility].

## 2017-09-19 NOTE — Assessment & Plan Note (Signed)
Okay to increase level of intensity on exercise. Check overnight oximetry and CPAP/room air

## 2017-09-19 NOTE — Progress Notes (Signed)
Pulmonary Individual Treatment Plan  Patient Details  Name: Louis [name] MRN: [id number] Date of Birth: 06-21-1955 Referring Provider:     Pulmonary Rehab Walk Test from 08/22/2017 in [place]  Referring Provider  [name]      Initial Encounter Date:    Pulmonary Rehab Walk Test from 08/22/2017 in [place]  Date  08/26/17      Visit Diagnosis: Acute respiratory failure with hypoxia and hypercapnia ([place])  [name] Home Medications on Admission:   Current Outpatient Medications:  .  ALPRAZolam (XANAX) 0.5 MG tablet, Take 1 tablet (0.5 mg total) by mouth at bedtime as needed for sleep., Disp: 30 tablet, Rfl: 0 .  amLODipine (NORVASC) 5 MG tablet, Take 2.5 mg by mouth daily. Pt takes 1/2 tablet daily, Disp: , Rfl:  .  budesonide-formoterol (SYMBICORT) 160-4.5 MCG/ACT inhaler, Inhale 2 puffs into the lungs 2 (two) times daily., Disp: 1 Inhaler, Rfl: 5 .  losartan (COZAAR) 100 MG tablet, TAKE ONE TABLET BY MOUTH ONE TIME DAILY , Disp: 90 tablet, Rfl: 2 .  metoprolol succinate (TOPROL-XL) 50 MG 24 hr tablet, Take 3 tablets (150 mg total) by mouth daily., Disp: 270 tablet, Rfl: 3 .  sildenafil (REVATIO) 20 MG tablet, Take 20 mg by mouth 3 (three) times daily as needed., Disp: , Rfl:  .  spironolactone (ALDACTONE) 25 MG tablet, Take 0.5 tablets (12.5 mg total) by mouth daily., Disp: 45 tablet, Rfl: 3 .  urea (CARMOL) 40 % CREA, apply to affected area twice a day for THICKEST AREAS if needed for dry skin, Disp: , Rfl: 0 .  Vitamin D, Ergocalciferol, (DRISDOL) 50000 UNITS CAPS capsule, Take 50,000 Units by mouth every 7 (seven) days., Disp: , Rfl:   Past Medical History: Past Medical History:  Diagnosis Date  . BPH (benign prostatic hyperplasia)    on flomax  . Bundle branch block    per prior PCP records  . Diverticulosis    by colonoscopy  . Eczema    per prior PCP records  . Elevated PSA 2015   peaked 6s, s/p  benign biopsy 2014, sees urology [name] ([place])  . Essential tremor   . GERD (gastroesophageal reflux disease)    per prior PCP records  . History of panic attacks    per prior PCP records  . HTN (hypertension)   . Mild intermittent asthma in adult without complication   . Obesity, Class I, BMI 30-34.9   . Osteoporosis    DEXA 06/2013 with osteopenia - h/o ?wrist/hip fracture from AVN from chronic steroid use (asthma), took reclast for 1 year  . Renal artery stenosis in 1 of 2 vessels ([place]) 2011   by [place]  . Seasonal allergies   . Thoracic scoliosis childhood  . Transaminitis    per prior PCP records    Tobacco Use: Social History   Tobacco Use  Smoking Status Never Smoker  Smokeless Tobacco Never Used    Labs: Recent Review Flowsheet Data    Labs for ITP Cardiac and Pulmonary Rehab Latest Ref Rng & Units 06/16/2013 10/11/2014 09/26/2015 07/02/2017   Cholestrol 0 - 200 mg/dL 204 204 214(H) -   LDLCALC 0 - 99 mg/dL 119 119 101(H) -   HDL >39.00 mg/dL 75 75 100.60 -   Trlycerides 0.0 - 149.0 mg/dL 51 51 60.0 -   PHART 7.350 - 7.450 - - - 7.305(L)   PCO2ART 32.0 - 48.0 mmHg - - -  72.4(HH)   HCO3 20.0 - 28.0 mmol/L - - - 35.0(H)   O2SAT % - - - 90.9      Capillary Blood Glucose: No results found for: GLUCAP   Pulmonary Assessment Scores: Pulmonary Assessment Scores    Row Name 08/21/17 1142 08/26/17 1018       ADL UCSD   ADL Phase  Entry  Entry    SOB Score total  12  -      CAT Score   CAT Score  2  -      mMRC Score   mMRC Score  -  0       Pulmonary Function Assessment:   Exercise Target Goals: Exercise Program Goal: Individual exercise prescription set using results from initial 6 min walk test and THRR while considering  patient's activity barriers and safety.   Exercise Prescription Goal: Initial exercise prescription builds to 30-45 minutes a day of aerobic activity, 2-3 days per week.  Home exercise guidelines will be given to patient during  program as part of exercise prescription that the participant will acknowledge.  Activity Barriers & Risk Stratification: Activity Barriers & Cardiac Risk Stratification - 08/14/17 1103      Activity Barriers & Cardiac Risk Stratification   Activity Barriers  Other (comment);Shortness of Breath;Back Problems;Arthritis    Comments  Scoliosis, right hip arthritis    Cardiac Risk Stratification  High       6 Minute Walk: 6 Minute Walk    Row Name 08/26/17 1018         6 Minute Walk   Phase  Initial     Distance  1200 feet     Walk Time  6 minutes     # of Rest Breaks  0     MPH  2.27     METS  2.68     RPE  12     Perceived Dyspnea   2     Symptoms  No     Resting HR  75 bpm     Resting BP  144/88     Resting Oxygen Saturation   93 %     Exercise Oxygen Saturation  during 6 min walk  88 %     Max Ex. HR  124 bpm     Max Ex. BP  170/80     2 Minute Post BP  142/84       Interval HR   1 Minute HR  77     2 Minute HR  76     3 Minute HR  76     4 Minute HR  76     5 Minute HR  124     6 Minute HR  112     2 Minute Post HR  84     Interval Heart Rate?  Yes       Interval Oxygen   Interval Oxygen?  Yes     Baseline Oxygen Saturation %  93 %     1 Minute Oxygen Saturation %  92 %     1 Minute Liters of Oxygen  0 L     2 Minute Oxygen Saturation %  88 %     2 Minute Liters of Oxygen  0 L     3 Minute Oxygen Saturation %  88 %     3 Minute Liters of Oxygen  0 L     4 Minute Oxygen Saturation %  88 %  4 Minute Liters of Oxygen  0 L     5 Minute Oxygen Saturation %  88 %     5 Minute Liters of Oxygen  0 L     6 Minute Oxygen Saturation %  89 %     6 Minute Liters of Oxygen  0 L     2 Minute Post Oxygen Saturation %  94 %     2 Minute Post Liters of Oxygen  0 L        Oxygen Initial Assessment: Oxygen Initial Assessment - 08/26/17 1017      Initial 6 min Walk   Oxygen Used  None      Program Oxygen Prescription   Program Oxygen Prescription  None        Oxygen Re-Evaluation: Oxygen Re-Evaluation    Row Name 09/17/17 0917             Program Oxygen Prescription   Program Oxygen Prescription  None         Home Oxygen   Home Oxygen Device  Home Concentrator       Sleep Oxygen Prescription  Continuous       Liters per minute  2       Home Exercise Oxygen Prescription  None       Home at Rest Exercise Oxygen Prescription  None       Compliance with Home Oxygen Use  Yes         Goals/Expected Outcomes   Short Term Goals  To learn and exhibit compliance with exercise, home and travel O2 prescription;To learn and understand importance of maintaining oxygen saturations>88%;To learn and demonstrate proper use of respiratory medications;To learn and understand importance of monitoring SPO2 with pulse oximeter and demonstrate accurate use of the pulse oximeter.;To learn and demonstrate proper pursed lip breathing techniques or other breathing techniques.       Long  Term Goals  Exhibits compliance with exercise, home and travel O2 prescription;Verbalizes importance of monitoring SPO2 with pulse oximeter and return demonstration;Maintenance of O2 saturations>88%;Exhibits proper breathing techniques, such as pursed lip breathing or other method taught during program session;Compliance with respiratory medication;Demonstrates proper use of MDI's       Goals/Expected Outcomes  compliance with night o2          Oxygen Discharge (Final Oxygen Re-Evaluation): Oxygen Re-Evaluation - 09/17/17 0917      Program Oxygen Prescription   Program Oxygen Prescription  None      Home Oxygen   Home Oxygen Device  Home Concentrator    Sleep Oxygen Prescription  Continuous    Liters per minute  2    Home Exercise Oxygen Prescription  None    Home at Rest Exercise Oxygen Prescription  None    Compliance with Home Oxygen Use  Yes      Goals/Expected Outcomes   Short Term Goals  To learn and exhibit compliance with exercise, home and travel O2  prescription;To learn and understand importance of maintaining oxygen saturations>88%;To learn and demonstrate proper use of respiratory medications;To learn and understand importance of monitoring SPO2 with pulse oximeter and demonstrate accurate use of the pulse oximeter.;To learn and demonstrate proper pursed lip breathing techniques or other breathing techniques.    Long  Term Goals  Exhibits compliance with exercise, home and travel O2 prescription;Verbalizes importance of monitoring SPO2 with pulse oximeter and return demonstration;Maintenance of O2 saturations>88%;Exhibits proper breathing techniques, such as pursed lip breathing or other method taught during program  session;Compliance with respiratory medication;Demonstrates proper use of MDI's    Goals/Expected Outcomes  compliance with night o2       Initial Exercise Prescription: Initial Exercise Prescription - 08/26/17 1000      Date of Initial Exercise RX and Referring Provider   Date  08/26/17    Referring Provider  [name]      Recumbant Bike   Level  1    Watts  10    Minutes  17      NuStep   Level  2    SPM  80    Minutes  17    METs  1.5      Track   Laps  6    Minutes  17      Prescription Details   Frequency (times per week)  2    Duration  Progress to 45 minutes of aerobic exercise without signs/symptoms of physical distress      Intensity   THRR 40-80% of Max Heartrate  64-128    Ratings of Perceived Exertion  11-13    Perceived Dyspnea  0-4      Progression   Progression  Continue progressive overload as per policy without signs/symptoms or physical distress.      Resistance Training   Training Prescription  Yes    Weight  blue bands    Reps  10-15       Perform Capillary Blood Glucose checks as needed.  Exercise Prescription Changes: Exercise Prescription Changes    Row Name 08/29/17 [phone] 09/17/17 1600           Response to Exercise   Blood Pressure (Admit)  124/80  108/64       Blood Pressure (Exercise)  140/70  128/70      Blood Pressure (Exit)  130/76  102/60      Heart Rate (Admit)  68 bpm  75 bpm      Heart Rate (Exercise)  78 bpm  102 bpm      Heart Rate (Exit)  71 bpm  76 bpm      Oxygen Saturation (Admit)  96 %  91 %      Oxygen Saturation (Exercise)  94 %  92 %      Oxygen Saturation (Exit)  94 %  92 %      Rating of Perceived Exertion (Exercise)  10  13      Perceived Dyspnea (Exercise)  1  1      Duration  Progress to 45 minutes of aerobic exercise without signs/symptoms of physical distress  Continue with 45 min of aerobic exercise without signs/symptoms of physical distress.      Intensity  THRR unchanged  THRR unchanged        Progression   Progression  -  Continue to progress workloads to maintain intensity without signs/symptoms of physical distress.        Resistance Training   Training Prescription  Yes  Yes      Weight  blue bands  blue bands      Reps  10-15  10-15      Time  -  10 Minutes        Recumbant Bike   Level  1  4      Watts  10  10      Minutes  17  17        NuStep   Level  2  5  SPM  80  80      Minutes  17  17      METs  2  2        Track   Laps  -  24      Minutes  -  17         Exercise Comments:   Exercise Goals and Review: Exercise Goals    Row Name 08/14/17 1023             Exercise Goals   Increase Physical Activity  Yes       Intervention  Provide advice, education, support and counseling about physical activity/exercise needs.;Develop an individualized exercise prescription for aerobic and resistive training based on initial evaluation findings, risk stratification, comorbidities and participant's personal goals.       Expected Outcomes  Short Term: Attend rehab on a regular basis to increase amount of physical activity.;Long Term: Exercising regularly at least 3-5 days a week.;Long Term: Add in home exercise to make exercise part of routine and to increase amount of physical activity.        Increase Strength and Stamina  Yes       Intervention  Provide advice, education, support and counseling about physical activity/exercise needs.;Develop an individualized exercise prescription for aerobic and resistive training based on initial evaluation findings, risk stratification, comorbidities and participant's personal goals.       Expected Outcomes  Short Term: Increase workloads from initial exercise prescription for resistance, speed, and METs.;Short Term: Perform resistance training exercises routinely during rehab and add in resistance training at home;Long Term: Improve cardiorespiratory fitness, muscular endurance and strength as measured by increased METs and functional capacity (6MWT)       Able to understand and use rate of perceived exertion (RPE) scale  Yes       Intervention  Provide education and explanation on how to use RPE scale       Expected Outcomes  Short Term: Able to use RPE daily in rehab to express subjective intensity level;Long Term:  Able to use RPE to guide intensity level when exercising independently       Able to understand and use Dyspnea scale  Yes       Intervention  Provide education and explanation on how to use Dyspnea scale       Expected Outcomes  Short Term: Able to use Dyspnea scale daily in rehab to express subjective sense of shortness of breath during exertion;Long Term: Able to use Dyspnea scale to guide intensity level when exercising independently       Knowledge and understanding of Target Heart Rate Range (THRR)  Yes       Intervention  Provide education and explanation of THRR including how the numbers were predicted and where they are located for reference       Expected Outcomes  Short Term: Able to state/look up THRR;Long Term: Able to use THRR to govern intensity when exercising independently;Short Term: Able to use daily as guideline for intensity in rehab       Understanding of Exercise Prescription  Yes       Intervention  Provide  education, explanation, and written materials on patient's individual exercise prescription       Expected Outcomes  Short Term: Able to explain program exercise prescription;Long Term: Able to explain home exercise prescription to exercise independently          Exercise Goals Re-Evaluation : Exercise Goals Re-Evaluation    Row  Name 09/17/17 0917             Exercise Goal Re-Evaluation   Exercise Goals Review  Understanding of Exercise Prescription;Knowledge and understanding of Target Heart Rate Range (THRR);Able to understand and use Dyspnea scale;Able to understand and use rate of perceived exertion (RPE) scale;Increase Strength and Stamina;Increase Physical Activity       Comments  Patient has only attended 4 rehab sessions. Is progressing well. Is able to walk 16 laps (200 ft) in 15 minutes. Will discuss home exercise and how to progress intensities at rehab and at home.       Expected Outcomes  Through exercise at rehab and at home, patient will increase physical capacity and be able to perform ADL's with ease. They will feel comfortable estblishing an exercise routine at home.           Discharge Exercise Prescription (Final Exercise Prescription Changes): Exercise Prescription Changes - 09/17/17 1600      Response to Exercise   Blood Pressure (Admit)  108/64    Blood Pressure (Exercise)  128/70    Blood Pressure (Exit)  102/60    Heart Rate (Admit)  75 bpm    Heart Rate (Exercise)  102 bpm    Heart Rate (Exit)  76 bpm    Oxygen Saturation (Admit)  91 %    Oxygen Saturation (Exercise)  92 %    Oxygen Saturation (Exit)  92 %    Rating of Perceived Exertion (Exercise)  13    Perceived Dyspnea (Exercise)  1    Duration  Continue with 45 min of aerobic exercise without signs/symptoms of physical distress.    Intensity  THRR unchanged      Progression   Progression  Continue to progress workloads to maintain intensity without signs/symptoms of physical distress.       Resistance Training   Training Prescription  Yes    Weight  blue bands    Reps  10-15    Time  10 Minutes      Recumbant Bike   Level  4    Watts  10    Minutes  17      NuStep   Level  5    SPM  80    Minutes  17    METs  2      Track   Laps  24    Minutes  17       Nutrition:  Target Goals: Understanding of nutrition guidelines, daily intake of sodium 1500mg , cholesterol 200mg , calories 30% from fat and 7% or less from saturated fats, daily to have 5 or more servings of fruits and vegetables.  Biometrics:    Nutrition Therapy Plan and Nutrition Goals: Nutrition Therapy & Goals - 08/14/17 1356      Nutrition Therapy   Diet  heart healthy      Personal Nutrition Goals   Nutrition Goal  identify and limit food sources of sodium    Personal Goal #2  identify food quantities necessary to achieve we loss of 6-24 lbs    Personal Goal #3  describe the benefit of including fruits, vegetables, whole grains, low fat dairy in a healthy meal plan      Intervention Plan   Intervention  Prescribe, educate and counsel regarding individualized specific dietary modifications aiming towards targeted core components such as weight, hypertension, lipid management, diabetes, heart failure and other comorbidities.    Expected Outcomes  Short Term Goal: Understand basic principles  of dietary content, such as calories, fat, sodium, cholesterol and nutrients.       Nutrition Assessments: Nutrition Assessments - 08/14/17 1400      Rate Your Plate Scores   Pre Score  40       Nutrition Goals Re-Evaluation: Nutrition Goals Re-Evaluation    [place] Name 09/12/17 1547             Goals   Nutrition Goal  identify and limit food sources of sodium, saturated fat, trans fat, refined carbohydrates         Personal Goal #2 Re-Evaluation   Personal Goal #2  identify food quantities necessary to achieve we loss of 6-24 lbs         Personal Goal #3 Re-Evaluation   Personal Goal #3   describe the benefit of including fruits, vegetables, whole grains, low fat dairy in a healthy meal plan          Nutrition Goals Discharge (Final Nutrition Goals Re-Evaluation): Nutrition Goals Re-Evaluation - 09/12/17 1547      Goals   Nutrition Goal  identify and limit food sources of sodium, saturated fat, trans fat, refined carbohydrates      Personal Goal #2 Re-Evaluation   Personal Goal #2  identify food quantities necessary to achieve we loss of 6-24 lbs      Personal Goal #3 Re-Evaluation   Personal Goal #3  describe the benefit of including fruits, vegetables, whole grains, low fat dairy in a healthy meal plan       Psychosocial: Target Goals: Acknowledge presence or absence of significant depression and/or stress, maximize coping skills, provide positive support system. Participant is able to verbalize types and ability to use techniques and skills needed for reducing stress and depression.  Initial Review & Psychosocial Screening: Initial Psych Review & Screening - 08/14/17 1025      Initial Review   Current issues with  None Identified      Family Dynamics   Good Support System?  Yes    Comments  supportive wife and children      Barriers   Psychosocial barriers to participate in program  There are no identifiable barriers or psychosocial needs.      Screening Interventions   Interventions  Encouraged to exercise    Expected Outcomes  Short Term goal: Identification and review with participant of any Quality of Life or Depression concerns found by scoring the questionnaire.;Long Term goal: The participant improves quality of Life and PHQ9 Scores as seen by post scores and/or verbalization of changes       Quality of Life Scores:  Scores of 19 and below usually indicate a poorer quality of life in these areas.  A difference of  2-3 points is a clinically meaningful difference.  A difference of 2-3 points in the total score of the Quality of Life Index has been  associated with significant improvement in overall quality of life, self-image, physical symptoms, and general health in studies assessing change in quality of life.  PHQ-9: Recent Review Flowsheet Data    Depression screen [facility] 2/9 08/14/2017   Decreased Interest 0   Down, Depressed, Hopeless 0   PHQ - 2 Score 0   Altered sleeping 0   Tired, decreased energy 0   Change in appetite 0   Feeling bad or failure about yourself  0   Trouble concentrating 0   Moving slowly or fidgety/restless 0   Suicidal thoughts 0   PHQ-9 Score 0  Difficult doing work/chores Not difficult at all     Interpretation of Total Score  Total Score Depression Severity:  1-4 = Minimal depression, 5-9 = Mild depression, 10-14 = Moderate depression, 15-19 = Moderately severe depression, 20-27 = Severe depression   Psychosocial Evaluation and Intervention: Psychosocial Evaluation - 09/19/17 1702      Psychosocial Evaluation & Interventions   Interventions  Relaxation education;Stress management education;Encouraged to exercise with the program and follow exercise prescription    Comments  no Psychosocial Barriers    Expected Outcomes  Pt continue to rely on his fatih for mental well being.    Continue Psychosocial Services   Follow up required by staff       Psychosocial Re-Evaluation: Psychosocial Re-Evaluation    [place] Name 09/19/17 1702             Psychosocial Re-Evaluation   Current issues with  None Identified       Comments  Pt denies any barriers to participating in pulmonary rehab       Expected Outcomes  Pt will continue to display positive and healthy coping skill and resilant faith       Interventions  Stress management education;Encouraged to attend Pulmonary Rehabilitation for the exercise;Relaxation education       Continue Psychosocial Services   Follow up required by staff          Psychosocial Discharge (Final Psychosocial Re-Evaluation): Psychosocial Re-Evaluation - 09/19/17 1702       Psychosocial Re-Evaluation   Current issues with  None Identified    Comments  Pt denies any barriers to participating in pulmonary rehab    Expected Outcomes  Pt will continue to display positive and healthy coping skill and resilant faith    Interventions  Stress management education;Encouraged to attend Pulmonary Rehabilitation for the exercise;Relaxation education    Continue Psychosocial Services   Follow up required by staff       Education: Education Goals: Education classes will be provided on a weekly basis, covering required topics. Participant will state understanding/return demonstration of topics presented.  Learning Barriers/Preferences: Learning Barriers/Preferences - 08/14/17 1027      Learning Barriers/Preferences   Learning Barriers  Sight    Learning Preferences  Computer/Internet;Group Instruction;Individual Instruction;Verbal Instruction;Video;Written Material       Education Topics: Risk Factor Reduction:  -Group instruction that is supported by a PowerPoint presentation. Instructor discusses the definition of a risk factor, different risk factors for pulmonary disease, and how the heart and lungs work together.     Nutrition for Pulmonary Patient:  -Group instruction provided by PowerPoint slides, verbal discussion, and written materials to support subject matter. The instructor gives an explanation and review of healthy diet recommendations, which includes a discussion on weight management, recommendations for fruit and vegetable consumption, as well as protein, fluid, caffeine, fiber, sodium, sugar, and alcohol. Tips for eating when patients are short of breath are discussed.   PULMONARY REHAB OTHER RESPIRATORY from 09/10/2017 in [facility]  Date  08/29/17  Educator  Rodman Pickle  Instruction Review Code  2- Demonstrated Understanding      Pursed Lip Breathing:  -Group instruction that is supported by demonstration and  informational handouts. Instructor discusses the benefits of pursed lip and diaphragmatic breathing and detailed demonstration on how to preform both.     Oxygen Safety:  -Group instruction provided by PowerPoint, verbal discussion, and written material to support subject matter. There is an overview of "What is Oxygen" and "  Why do we need it".  Instructor also reviews how to create a safe environment for oxygen use, the importance of using oxygen as prescribed, and the risks of noncompliance. There is a brief discussion on traveling with oxygen and resources the patient may utilize.   Oxygen Equipment:  -Group instruction provided by [facility] Staff utilizing handouts, written materials, and equipment demonstrations.   Signs and Symptoms:  -Group instruction provided by written material and verbal discussion to support subject matter. Warning signs and symptoms of infection, stroke, and heart attack are reviewed and when to call the physician/911 reinforced. Tips for preventing the spread of infection discussed.   Advanced Directives:  -Group instruction provided by verbal instruction and written material to support subject matter. Instructor reviews Advanced Directive laws and proper instruction for filling out document.   Pulmonary Video:  -Group video education that reviews the importance of medication and oxygen compliance, exercise, good nutrition, pulmonary hygiene, and pursed lip and diaphragmatic breathing for the pulmonary patient.   Exercise for the Pulmonary Patient:  -Group instruction that is supported by a PowerPoint presentation. Instructor discusses benefits of exercise, core components of exercise, frequency, duration, and intensity of an exercise routine, importance of utilizing pulse oximetry during exercise, safety while exercising, and options of places to exercise outside of rehab.     Pulmonary Medications:  -Verbally interactive group education provided by  instructor with focus on inhaled medications and proper administration.   PULMONARY REHAB OTHER RESPIRATORY from 09/10/2017 in [place]  Date  09/10/17  Educator  Pharmacy  Instruction Review Code  1- Verbalizes Understanding      Anatomy and Physiology of the Respiratory System and Intimacy:  -Group instruction provided by PowerPoint, verbal discussion, and written material to support subject matter. Instructor reviews respiratory cycle and anatomical components of the respiratory system and their functions. Instructor also reviews differences in obstructive and restrictive respiratory diseases with examples of each. Intimacy, Sex, and Sexuality differences are reviewed with a discussion on how relationships can change when diagnosed with pulmonary disease. Common sexual concerns are reviewed.   MD DAY -A group question and answer session with a medical doctor that allows participants to ask questions that relate to their pulmonary disease state.   OTHER EDUCATION -Group or individual verbal, written, or video instructions that support the educational goals of the pulmonary rehab program.   Holiday Eating Survival Tips:  -Group instruction provided by PowerPoint slides, verbal discussion, and written materials to support subject matter. The instructor gives patients tips, tricks, and techniques to help them not only survive but enjoy the holidays despite the onslaught of food that accompanies the holidays.   Knowledge Questionnaire Score: Knowledge Questionnaire Score - 08/21/17 1144      Knowledge Questionnaire Score   Pre Score  16/18       Core Components/Risk Factors/Patient Goals at Admission: Personal Goals and Risk Factors at Admission - 08/14/17 1029      Core Components/Risk Factors/Patient Goals on Admission    Weight Management  Weight Loss    Admit Weight  225 lb 15.5 oz (102.5 kg)    Goal Weight: Short Term  215 lb (97.5 kg)     Goal Weight: Long Term  200 lb (90.7 kg)    Expected Outcomes  Short Term: Continue to assess and modify interventions until short term weight is achieved;Weight Loss: Understanding of general recommendations for a balanced deficit meal plan, which promotes 1-2 lb weight loss per  week and includes a negative energy balance of [phone] kcal/d;Understanding of distribution of calorie intake throughout the day with the consumption of 4-5 meals/snacks;Understanding recommendations for meals to include 15-35% energy as protein, 25-35% energy from fat, 35-60% energy from carbohydrates, less than 200mg  of dietary cholesterol, 20-35 gm of total fiber daily    Improve shortness of breath with ADL's  Yes    Intervention  Provide education, individualized exercise plan and daily activity instruction to help decrease symptoms of SOB with activities of daily living.    Expected Outcomes  Short Term: Improve cardiorespiratory fitness to achieve a reduction of symptoms when performing ADLs;Long Term: Be able to perform more ADLs without symptoms or delay the onset of symptoms    Heart Failure  Yes    Intervention  Provide a combined exercise and nutrition program that is supplemented with education, support and counseling about heart failure. Directed toward relieving symptoms such as shortness of breath, decreased exercise tolerance, and extremity edema.    Expected Outcomes  Improve functional capacity of life;Short term: Attendance in program 2-3 days a week with increased exercise capacity. Reported lower sodium intake. Reported increased fruit and vegetable intake. Reports medication compliance.;Short term: Daily weights obtained and reported for increase. Utilizing diuretic protocols set by physician.;Long term: Adoption of self-care skills and reduction of barriers for early signs and symptoms recognition and intervention leading to self-care maintenance.    Hypertension  Yes    Intervention  Provide education on  lifestyle modifcations including regular physical activity/exercise, weight management, moderate sodium restriction and increased consumption of fresh fruit, vegetables, and low fat dairy, alcohol moderation, and smoking cessation.;Monitor prescription use compliance.    Expected Outcomes  Short Term: Continued assessment and intervention until BP is < 140/70mm HG in hypertensive participants. < 130/44mm HG in hypertensive participants with diabetes, heart failure or chronic kidney disease.;Long Term: Maintenance of blood pressure at goal levels.       Core Components/Risk Factors/Patient Goals Review:  Goals and Risk Factor Review    Row Name 09/19/17 1704             Core Components/Risk Factors/Patient Goals Review   Personal Goals Review  Weight Management/Obesity;Heart Failure;Develop more efficient breathing techniques such as purse lipped breathing and diaphragmatic breathing and practicing self-pacing with activity.;Increase knowledge of respiratory medications and ability to use respiratory devices properly.;Hypertension;Improve shortness of breath with ADL's       Review  Pt is off to a great start with the completion of 6 exercise sessions.  Pt with weight loss of 3.3 kg,  Pt reports watching his diet, weighing daily and medication compliance.  Pt dypnea level ranges 0-1.  Pt workloads with averaging 24 laps around the track, recumbent bike increase to level 4 and nustep increase to level 5.  I anticipate pt will continue to show progress toward goals in the next 30 day assessment       Expected Outcomes  See Admission Goals/Outcomes          Core Components/Risk Factors/Patient Goals at Discharge (Final Review):  Goals and Risk Factor Review - 09/19/17 1704      Core Components/Risk Factors/Patient Goals Review   Personal Goals Review  Weight Management/Obesity;Heart Failure;Develop more efficient breathing techniques such as purse lipped breathing and diaphragmatic breathing and  practicing self-pacing with activity.;Increase knowledge of respiratory medications and ability to use respiratory devices properly.;Hypertension;Improve shortness of breath with ADL's    Review  Pt is off to a great start  with the completion of 6 exercise sessions.  Pt with weight loss of 3.3 kg,  Pt reports watching his diet, weighing daily and medication compliance.  Pt dypnea level ranges 0-1.  Pt workloads with averaging 24 laps around the track, recumbent bike increase to level 4 and nustep increase to level 5.  I anticipate pt will continue to show progress toward goals in the next 30 day assessment    Expected Outcomes  See Admission Goals/Outcomes       ITP Comments: ITP Comments    Row Name 08/14/17 1002 09/19/17 1702         ITP Comments  Dr. Jennet Maduro,  Medical Director  Dr. Jennet Maduro,  Medical Director Pulmonary Rehab         Comments: Pt completed 6 exercise sessions. Continue to monitor. Cherre Huger, BSN Cardiac and Training and development officer

## 2017-09-20 NOTE — Progress Notes (Signed)
I have reviewed a Home Exercise Prescription with Louis [name] . Louis [name] is not currently exercising at home.  The patient was advised to walk 3 days a week for 30 minutes.  Louis [name] and I discussed how to progress their exercise prescription.  The patient stated that their goals were to lose weight and increase exercise endurance.  The patient stated that they understand the exercise prescription.  We reviewed exercise guidelines, target heart rate during exercise, RPE Scale, weather conditions, NTG use, endpoints for exercise, warmup and cool down.  Patient is encouraged to come to me with any questions. I will continue to follow up with the patient to assist them with progression and safety.

## 2017-09-24 ENCOUNTER — Encounter (HOSPITAL_COMMUNITY)
Admission: RE | Admit: 2017-09-24 | Discharge: 2017-09-24 | Disposition: A | Discharge status: Home / Self Care | Payer: 59 | Source: Ambulatory Visit | Attending: Pulmonary Disease | Admitting: Pulmonary Disease

## 2017-09-24 ENCOUNTER — Encounter: Payer: Self-pay | Admitting: Cardiovascular Disease

## 2017-09-24 DIAGNOSIS — J9601 Acute respiratory failure with hypoxia: Secondary | ICD-10-CM

## 2017-09-24 DIAGNOSIS — J9602 Acute respiratory failure with hypercapnia: Principal | ICD-10-CM

## 2017-09-24 NOTE — Progress Notes (Signed)
Daily Session Note  Patient Details  Name: Louis [name] MRN: [id number] Date of Birth: Jun 04, 1955 Referring Provider:     Pulmonary Rehab Walk Test from 08/22/2017 in [place]  Referring Provider  [name]      Encounter Date: 09/24/2017  Check In: Session Check In - 09/24/17 1512      Check-In   Supervising physician immediately available to respond to emergencies  [facility] immediately available    Physician(s)  Dr.Danford     Location  [facility]    Staff Present  Maurice Small, RN, Engineer, water, MS, ACSM RCEP, Exercise Physiologist;Maria Whitaker, Therapist, sports, BSN;Ramon Dredge, RN, MHA    Medication changes reported      No    Fall or balance concerns reported     No    Tobacco Cessation  No Change    Warm-up and Cool-down  Performed as group-led instruction    Resistance Training Performed  Yes    VAD Patient?  No    PAD/SET Patient?  No      Pain Assessment   Currently in Pain?  No/denies       Capillary Blood Glucose: No results found for this or any previous visit (from the past 24 hour(s)).    Social History   Tobacco Use  Smoking Status Never Smoker  Smokeless Tobacco Never Used    Goals Met:  Exercise tolerated well  Goals Unmet:  Not Applicable  Comments: Service time is from 1:30p to 3:15p    [name] is Medical Director for Pulmonary Rehab at [facility].

## 2017-09-26 ENCOUNTER — Encounter (HOSPITAL_COMMUNITY): Payer: 59

## 2017-10-01 ENCOUNTER — Encounter (HOSPITAL_COMMUNITY)
Admission: RE | Admit: 2017-10-01 | Discharge: 2017-10-01 | Disposition: A | Discharge status: Home / Self Care | Payer: 59 | Source: Ambulatory Visit | Attending: Pulmonary Disease | Admitting: Pulmonary Disease

## 2017-10-01 DIAGNOSIS — I5189 Other ill-defined heart diseases: Secondary | ICD-10-CM | POA: Diagnosis not present

## 2017-10-01 DIAGNOSIS — J9601 Acute respiratory failure with hypoxia: Secondary | ICD-10-CM | POA: Insufficient documentation

## 2017-10-01 DIAGNOSIS — J9602 Acute respiratory failure with hypercapnia: Secondary | ICD-10-CM | POA: Diagnosis not present

## 2017-10-01 NOTE — Progress Notes (Signed)
Daily Session Note  Patient Details  Name: TREON KEHL MRN: [id number] Date of Birth: [birth year] Referring Provider:     Pulmonary Rehab Walk Test from 08/22/2017 in [place]  Referring Provider  [name]      Encounter Date: 10/01/2017  Check In: Session Check In - 10/01/17 1525      Check-In   Supervising physician immediately available to respond to emergencies  [facility] immediately available    Physician(s)  Dr. Louanne Belton    Location  [facility]    Staff Present  Maurice Small, RN, BSN;Molly DiVincenzo, MS, ACSM RCEP, Exercise Physiologist;Annedrea Rosezella [place], RN, Ramonita Lab, RN    Medication changes reported      No    Fall or balance concerns reported     No    Tobacco Cessation  No Change    Warm-up and Cool-down  Performed as group-led instruction    Resistance Training Performed  Yes    VAD Patient?  No      Pain Assessment   Currently in Pain?  No/denies       Capillary Blood Glucose: No results found for this or any previous visit (from the past 24 hour(s)).  Exercise Prescription Changes - 10/01/17 1600      Response to Exercise   Blood Pressure (Admit)  128/80    Blood Pressure (Exercise)  170/86    Blood Pressure (Exit)  124/70    Heart Rate (Admit)  67 bpm    Heart Rate (Exercise)  107 bpm    Heart Rate (Exit)  85 bpm    Oxygen Saturation (Admit)  95 %    Oxygen Saturation (Exercise)  91 %    Oxygen Saturation (Exit)  95 %    Rating of Perceived Exertion (Exercise)  13    Perceived Dyspnea (Exercise)  1    Duration  Continue with 45 min of aerobic exercise without signs/symptoms of physical distress.    Intensity  THRR unchanged      Progression   Progression  Continue to progress workloads to maintain intensity without signs/symptoms of physical distress.      Resistance Training   Training Prescription  Yes    Weight  blue bands    Reps  10-15    Time  10 Minutes      Interval Training   Interval Training  Yes    Equipment  Bike;NuStep      Treadmill   MPH  2.5    Grade  3    Minutes  17      Bike   Level  1.4   HITT   Minutes  17      NuStep   Level  5    SPM  80    Minutes  17    METs  3.3       Social History   Tobacco Use  Smoking Status Never Smoker  Smokeless Tobacco Never Used    Goals Met:  Exercise tolerated well No report of cardiac concerns or symptoms Strength training completed today  Goals Unmet:  Not Applicable  Comments: Service time is from 1330 to 1515. During the cool down Aidenjames stated that he was having some chest pain. He stated that it was dull aching and felt like it did 09/03/17. He states that it felt like indigestion as it did before. VS check with the complaint. B\P 130/60,HR 86 and sat on room air 94%.  It quickly improved without treatment and he was discharged in stable condition. He was instructed to call 911 if the pain returned.    [name] is Medical Director for Pulmonary Rehab at [facility].

## 2017-10-02 NOTE — Telephone Encounter (Signed)
PCC's- I see that we ord ONO but pt is emailing [place] stating it was not set up yet  Can you please help with this, thanks!

## 2017-10-02 NOTE — Telephone Encounter (Signed)
I spoke to [place] at [facility].  He is going to contact Respiratory Dept & have them to call pt today to get this set up.  He states if there is an issue he will call me back.

## 2017-10-03 ENCOUNTER — Encounter (HOSPITAL_COMMUNITY)
Admission: RE | Admit: 2017-10-03 | Discharge: 2017-10-03 | Disposition: A | Discharge status: Home / Self Care | Payer: 59 | Source: Ambulatory Visit | Attending: Pulmonary Disease | Admitting: Pulmonary Disease

## 2017-10-03 DIAGNOSIS — J9601 Acute respiratory failure with hypoxia: Secondary | ICD-10-CM

## 2017-10-03 DIAGNOSIS — J9602 Acute respiratory failure with hypercapnia: Principal | ICD-10-CM

## 2017-10-03 NOTE — Progress Notes (Signed)
Daily Session Note  Patient Details  Name: ADETOKUNBO MCCADDEN MRN: [id number] Date of Birth: [birth year] Referring Provider:     Pulmonary Rehab Walk Test from 08/22/2017 in [place]  Referring Provider  [name]      Encounter Date: 10/03/2017  Check In: Session Check In - 10/03/17 1359      Check-In   Supervising physician immediately available to respond to emergencies  [facility] immediately available    Physician(s)  Dr. Horris Latino    Location  [facility]    Staff Present  Maurice Small, RN, BSN;Molly DiVincenzo, MS, ACSM RCEP, Exercise Physiologist;Kimbrely Buckel Ysidro Evert, Felipe Drone, RN, MHA    Medication changes reported      No    Fall or balance concerns reported     No    Tobacco Cessation  No Change    Warm-up and Cool-down  Performed as group-led instruction    Resistance Training Performed  Yes    VAD Patient?  No    PAD/SET Patient?  No      Pain Assessment   Currently in Pain?  No/denies    Pain Score  0-No pain    Multiple Pain Sites  No       Capillary Blood Glucose: No results found for this or any previous visit (from the past 24 hour(s)).    Social History   Tobacco Use  Smoking Status Never Smoker  Smokeless Tobacco Never Used    Goals Met:  Exercise tolerated well No report of cardiac concerns or symptoms Strength training completed today  Goals Unmet:  Not Applicable  Comments: Service time is from 1330 to 1530    [name] is Medical Director for Pulmonary Rehab at [facility].

## 2017-10-08 ENCOUNTER — Encounter (HOSPITAL_COMMUNITY)
Admission: RE | Admit: 2017-10-08 | Discharge: 2017-10-08 | Disposition: A | Discharge status: Home / Self Care | Payer: 59 | Source: Ambulatory Visit | Attending: Pulmonary Disease | Admitting: Pulmonary Disease

## 2017-10-08 ENCOUNTER — Telehealth: Payer: Self-pay | Admitting: Pulmonary Disease

## 2017-10-08 DIAGNOSIS — J9601 Acute respiratory failure with hypoxia: Secondary | ICD-10-CM | POA: Diagnosis not present

## 2017-10-08 DIAGNOSIS — J9602 Acute respiratory failure with hypercapnia: Principal | ICD-10-CM

## 2017-10-08 NOTE — Telephone Encounter (Signed)
Received patient's ONO results from [facility]. Per results, they were performed on room air and CPAP 10cm. Results have been placed into RA's folder.   RA, please advise on results. Thanks!

## 2017-10-08 NOTE — Progress Notes (Signed)
Daily Session Note  Patient Details  Name: Louis [name] MRN: [id number] Date of Birth: 11-07-1955 Referring Provider:     Pulmonary Rehab Walk Test from 08/22/2017 in [place]  Referring Provider  [name]      Encounter Date: 10/08/2017  Check In: Session Check In - 10/08/17 1523      Check-In   Supervising physician immediately available to respond to emergencies  [facility] immediately available    Physician(s)  Dr. Horris Latino    Location  [facility]    Staff Present  Su Hilt, MS, ACSM RCEP, Exercise Physiologist;Lisa Colletta [place], RN, MHA    Medication changes reported      No    Fall or balance concerns reported     No    Tobacco Cessation  No Change    Warm-up and Cool-down  Performed as group-led instruction    Resistance Training Performed  Yes    VAD Patient?  No    PAD/SET Patient?  No      Pain Assessment   Currently in Pain?  No/denies    Pain Score  0-No pain    Multiple Pain Sites  No       Capillary Blood Glucose: No results found for this or any previous visit (from the past 24 hour(s)).    Social History   Tobacco Use  Smoking Status Never Smoker  Smokeless Tobacco Never Used    Goals Met:  Exercise tolerated well  Goals Unmet:  Not Applicable  Comments: Service time is from 1:30p to 3:00p    [name] is Medical Director for Pulmonary Rehab at [facility].

## 2017-10-10 ENCOUNTER — Encounter (HOSPITAL_COMMUNITY)
Admission: RE | Admit: 2017-10-10 | Discharge: 2017-10-10 | Disposition: A | Discharge status: Home / Self Care | Payer: 59 | Source: Ambulatory Visit | Attending: Pulmonary Disease | Admitting: Pulmonary Disease

## 2017-10-10 DIAGNOSIS — J9601 Acute respiratory failure with hypoxia: Secondary | ICD-10-CM | POA: Diagnosis not present

## 2017-10-10 DIAGNOSIS — J9602 Acute respiratory failure with hypercapnia: Principal | ICD-10-CM

## 2017-10-10 NOTE — Telephone Encounter (Signed)
Still has mild desatn < 88% for 1h 65m Ideally should continue O2 but if he has felt OK without it for a few weeks, then OK to discontinue this

## 2017-10-10 NOTE — Telephone Encounter (Signed)
Patient has been notified via [place]. Will close this encounter.

## 2017-10-10 NOTE — Progress Notes (Signed)
Daily Session Note  Patient Details  Name: Louis [name] MRN: [id number] Date of Birth: 05-08-1955 Referring Provider:     Pulmonary Rehab Walk Test from 08/22/2017 in [place]  Referring Provider  [name]      Encounter Date: 10/10/2017  Check In: Session Check In - 10/10/17 1357      Check-In   Supervising physician immediately available to respond to emergencies  [facility] immediately available    Physician(s)  Dr. Herbert Moors    Location  [facility]    Staff Present  Su Hilt, MS, ACSM RCEP, Exercise Physiologist;[place] Masaki Colletta [place], RN, MHA;Dalton Kris Mouton, MS, Exercise Physiologist    Medication changes reported      No    Fall or balance concerns reported     No    Tobacco Cessation  No Change    Warm-up and Cool-down  Performed as group-led instruction    Resistance Training Performed  Yes    VAD Patient?  No    PAD/SET Patient?  No      Pain Assessment   Currently in Pain?  No/denies    Multiple Pain Sites  No       Capillary Blood Glucose: No results found for this or any previous visit (from the past 24 hour(s)).    Social History   Tobacco Use  Smoking Status Never Smoker  Smokeless Tobacco Never Used    Goals Met:  Exercise tolerated well No report of cardiac concerns or symptoms Strength training completed today  Goals Unmet:  Not Applicable  Comments: Service time is from 1330 to 1540    [name] is Medical Director for Pulmonary Rehab at [facility].

## 2017-10-11 ENCOUNTER — Telehealth: Payer: Self-pay | Admitting: *Deleted

## 2017-10-11 NOTE — Telephone Encounter (Signed)
Per email received from Patient, he is leaving for [place], 10/18-10/20.  He is requesting O2 to be arranged for his trip.  I called and spoke with [place], [facility].  He stated that he may have to transfer him to a [facility] that will except his insurance, because of the short time frame.  Corene Cornea suggested that the Patient call [facility] immediately, so they can start helping as soon as possible.  Left message for Patient to call and sent him a e mail with Jason's recommendations.

## 2017-10-11 NOTE — Telephone Encounter (Signed)
Patient returned call to office.  Tedra Coupe, [facility], recommendations.  Patient stated understanding. Nothing further at this time.

## 2017-10-11 NOTE — Telephone Encounter (Signed)
Patient calling as advised per MyChart email, CB is [phone].

## 2017-10-13 ENCOUNTER — Other Ambulatory Visit: Payer: Self-pay | Admitting: Family Medicine

## 2017-10-14 ENCOUNTER — Ambulatory Visit: Payer: 59 | Admitting: Cardiovascular Disease

## 2017-10-14 ENCOUNTER — Other Ambulatory Visit: Payer: Self-pay | Admitting: Family Medicine

## 2017-10-15 ENCOUNTER — Encounter (HOSPITAL_COMMUNITY)
Admission: RE | Admit: 2017-10-15 | Discharge: 2017-10-15 | Disposition: A | Discharge status: Home / Self Care | Payer: 59 | Source: Ambulatory Visit | Attending: Pulmonary Disease | Admitting: Pulmonary Disease

## 2017-10-15 ENCOUNTER — Other Ambulatory Visit: Payer: Self-pay

## 2017-10-15 VITALS — Wt 221.1 lb

## 2017-10-15 DIAGNOSIS — J9601 Acute respiratory failure with hypoxia: Secondary | ICD-10-CM | POA: Diagnosis not present

## 2017-10-15 DIAGNOSIS — J9602 Acute respiratory failure with hypercapnia: Principal | ICD-10-CM

## 2017-10-15 MED ORDER — LOSARTAN POTASSIUM 100 MG PO TABS: 100.0000 mg | ORAL_TABLET | Freq: Every day | ORAL | 0 refills | Status: DC

## 2017-10-15 NOTE — Progress Notes (Signed)
Daily Session Note  Patient Details  Name: Louis [name] MRN: [id number] Date of Birth: 12-Nov-1955 Referring Provider:     Pulmonary Rehab Walk Test from 08/22/2017 in [place]  Referring Provider  [name]      Encounter Date: 10/15/2017  Check In: Session Check In - 10/15/17 1339      Check-In   Supervising physician immediately available to respond to emergencies  [facility] immediately available    Physician(s)   Dr. Herbert Moors    Location  [facility]    Staff Present  Hoy Register, MS, Exercise Physiologist;Prakriti Carignan Ysidro Evert, Felipe Drone, RN, MHA;Molly DiVincenzo, MS, ACSM RCEP, Exercise Physiologist    Medication changes reported      No    Fall or balance concerns reported     No    Tobacco Cessation  No Change    Warm-up and Cool-down  Performed as group-led instruction    Resistance Training Performed  Yes    VAD Patient?  No    PAD/SET Patient?  No      Pain Assessment   Currently in Pain?  No/denies    Multiple Pain Sites  No       Capillary Blood Glucose: No results found for this or any previous visit (from the past 24 hour(s)).  Exercise Prescription Changes - 10/15/17 1600      Response to Exercise   Blood Pressure (Admit)  110/60    Blood Pressure (Exercise)  168/82    Blood Pressure (Exit)  116/68    Heart Rate (Admit)  75 bpm    Heart Rate (Exercise)  108 bpm    Heart Rate (Exit)  75 bpm    Oxygen Saturation (Admit)  94 %    Oxygen Saturation (Exercise)  90 %    Oxygen Saturation (Exit)  93 %    Rating of Perceived Exertion (Exercise)  13    Perceived Dyspnea (Exercise)  2    Duration  Continue with 45 min of aerobic exercise without signs/symptoms of physical distress.    Intensity  THRR unchanged      Progression   Progression  Continue to progress workloads to maintain intensity without signs/symptoms of physical distress.      Resistance Training   Training Prescription  No     Weight  blue bands    Reps  10-15    Time  10 Minutes      Interval Training   Interval Training  Yes    Equipment  Bike;NuStep      Treadmill   MPH  3.1    Grade  4    Minutes  17      Bike   Level  1.5   HITT   Minutes  17      NuStep   Level  5   HITT   SPM  80    Minutes  17    METs  2.9       Social History   Tobacco Use  Smoking Status Never Smoker  Smokeless Tobacco Never Used    Goals Met:  Exercise tolerated well No report of cardiac concerns or symptoms Strength training completed today  Goals Unmet:  Not Applicable  Comments: Service time is from 13330 to 1500    [name] is Medical Director for Pulmonary Rehab at [facility].

## 2017-10-15 NOTE — Telephone Encounter (Signed)
E-scribed losartan refill

## 2017-10-16 NOTE — Telephone Encounter (Signed)
Messages to and from patient:  Good afternoon [name],   We aren't able to send a prescription through mychart but if you get [place] the fax number for the company in [place] you would like the order sent to, we should be able to send that. You need an order for night time oxygen to be bled into your CPAP is that correct?  Thank you,  [facility].     ----- Message -----  From: Louis [name]  Sent: 10/16/20192:20 PM EDT  To: Louis [name]. Elsworth Soho, MD Subject: Visit Follow-Up Question  Hi,  I am traveling to [place], [place] this weekend. I was unable to make a satisfactory arrangement for oxygen through [place]. I called a [facility] and can arrange for it through them, but they need a copy of a prescription from [name]. Can you sent that to me by return message so I can forward it to the [facility]? Thanks, Louis [name]

## 2017-10-17 ENCOUNTER — Encounter (HOSPITAL_COMMUNITY)
Admission: RE | Admit: 2017-10-17 | Discharge: 2017-10-17 | Disposition: A | Discharge status: Home / Self Care | Payer: 59 | Source: Ambulatory Visit | Attending: Pulmonary Disease | Admitting: Pulmonary Disease

## 2017-10-17 DIAGNOSIS — J9601 Acute respiratory failure with hypoxia: Secondary | ICD-10-CM

## 2017-10-17 DIAGNOSIS — J9602 Acute respiratory failure with hypercapnia: Principal | ICD-10-CM

## 2017-10-17 NOTE — Progress Notes (Signed)
Daily Session Note  Patient Details  Name: Louis [name] MRN: [id number] Date of Birth: 1955/02/24 Referring Provider:     Pulmonary Rehab Walk Test from 08/22/2017 in [place]  Referring Provider  [name]      Encounter Date: 10/17/2017  Check In: Session Check In - 10/17/17 1547      Check-In   Supervising physician immediately available to respond to emergencies  [facility] immediately available       Capillary Blood Glucose: No results found for this or any previous visit (from the past 24 hour(s)).    Social History   Tobacco Use  Smoking Status Never Smoker  Smokeless Tobacco Never Used    Goals Met:  Exercise tolerated well No report of cardiac concerns or symptoms Strength training completed today  Goals Unmet:  Not Applicable  Comments: Service time is from 1330 to 1505    [name] is Medical Director for Pulmonary Rehab at [facility].

## 2017-10-17 NOTE — Progress Notes (Signed)
Pulmonary Individual Treatment Plan  Patient Details  Name: Louis [name] MRN: [id number] Date of Birth: 24-Feb-1955 Referring Provider:     Pulmonary Rehab Walk Test from 08/22/2017 in [place]  Referring Provider  [name]      Initial Encounter Date:    Pulmonary Rehab Walk Test from 08/22/2017 in [place]  Date  08/26/17      Visit Diagnosis: Acute respiratory failure with hypoxia and hypercapnia ([place])  [name] Home Medications on Admission:   Current Outpatient Medications:  .  ALPRAZolam (XANAX) 0.5 MG tablet, Take 1 tablet (0.5 mg total) by mouth at bedtime as needed for sleep., Disp: 30 tablet, Rfl: 0 .  amLODipine (NORVASC) 5 MG tablet, Take 2.5 mg by mouth daily. Pt takes 1/2 tablet daily, Disp: , Rfl:  .  budesonide-formoterol (SYMBICORT) 160-4.5 MCG/ACT inhaler, Inhale 2 puffs into the lungs 2 (two) times daily., Disp: 1 Inhaler, Rfl: 5 .  losartan (COZAAR) 100 MG tablet, Take 1 tablet (100 mg total) by mouth daily., Disp: 90 tablet, Rfl: 0 .  metoprolol succinate (TOPROL-XL) 50 MG 24 hr tablet, TAKE 3 TABLETS BY MOUTH DAILY, Disp: 270 tablet, Rfl: 0 .  sildenafil (REVATIO) 20 MG tablet, Take 20 mg by mouth 3 (three) times daily as needed., Disp: , Rfl:  .  spironolactone (ALDACTONE) 25 MG tablet, Take 0.5 tablets (12.5 mg total) by mouth daily., Disp: 45 tablet, Rfl: 3 .  urea (CARMOL) 40 % CREA, apply to affected area twice a day for THICKEST AREAS if needed for dry skin, Disp: , Rfl: 0 .  Vitamin D, Ergocalciferol, (DRISDOL) 50000 UNITS CAPS capsule, Take 50,000 Units by mouth every 7 (seven) days., Disp: , Rfl:   Past Medical History: Past Medical History:  Diagnosis Date  . BPH (benign prostatic hyperplasia)    on flomax  . Bundle branch block    per prior PCP records  . Diverticulosis    by colonoscopy  . Eczema    per prior PCP records  . Elevated PSA 2015   peaked 6s, s/p benign biopsy  2014, sees urology [name] ([place])  . Essential tremor   . GERD (gastroesophageal reflux disease)    per prior PCP records  . History of panic attacks    per prior PCP records  . HTN (hypertension)   . Mild intermittent asthma in adult without complication   . Obesity, Class I, BMI 30-34.9   . Osteoporosis    DEXA 06/2013 with osteopenia - h/o ?wrist/hip fracture from AVN from chronic steroid use (asthma), took reclast for 1 year  . Renal artery stenosis in 1 of 2 vessels ([place]) 2011   by [place]  . Seasonal allergies   . Thoracic scoliosis childhood  . Transaminitis    per prior PCP records    Tobacco Use: Social History   Tobacco Use  Smoking Status Never Smoker  Smokeless Tobacco Never Used    Labs: Recent Review Flowsheet Data    Labs for ITP Cardiac and Pulmonary Rehab Latest Ref Rng & Units 06/16/2013 10/11/2014 09/26/2015 07/02/2017   Cholestrol 0 - 200 mg/dL 204 204 214(H) -   LDLCALC 0 - 99 mg/dL 119 119 101(H) -   HDL >39.00 mg/dL 75 75 100.60 -   Trlycerides 0.0 - 149.0 mg/dL 51 51 60.0 -   PHART 7.350 - 7.450 - - - 7.305(L)   PCO2ART 32.0 - 48.0 mmHg - - - 72.4(HH)  HCO3 20.0 - 28.0 mmol/L - - - 35.0(H)   O2SAT % - - - 90.9      Capillary Blood Glucose: No results found for: GLUCAP   Pulmonary Assessment Scores: Pulmonary Assessment Scores    Row Name 08/21/17 1142 08/26/17 1018       ADL UCSD   ADL Phase  Entry  Entry    SOB Score total  12  -      CAT Score   CAT Score  2  -      mMRC Score   mMRC Score  -  0       Pulmonary Function Assessment:   Exercise Target Goals: Exercise Program Goal: Individual exercise prescription set using results from initial 6 min walk test and THRR while considering  patient's activity barriers and safety.   Exercise Prescription Goal: Initial exercise prescription builds to 30-45 minutes a day of aerobic activity, 2-3 days per week.  Home exercise guidelines will be given to patient during program as part  of exercise prescription that the participant will acknowledge.  Activity Barriers & Risk Stratification: Activity Barriers & Cardiac Risk Stratification - 08/14/17 1103      Activity Barriers & Cardiac Risk Stratification   Activity Barriers  Other (comment);Shortness of Breath;Back Problems;Arthritis    Comments  Scoliosis, right hip arthritis    Cardiac Risk Stratification  High       6 Minute Walk: 6 Minute Walk    Row Name 08/26/17 1018         6 Minute Walk   Phase  Initial     Distance  1200 feet     Walk Time  6 minutes     # of Rest Breaks  0     MPH  2.27     METS  2.68     RPE  12     Perceived Dyspnea   2     Symptoms  No     Resting HR  75 bpm     Resting BP  144/88     Resting Oxygen Saturation   93 %     Exercise Oxygen Saturation  during 6 min walk  88 %     Max Ex. HR  124 bpm     Max Ex. BP  170/80     2 Minute Post BP  142/84       Interval HR   1 Minute HR  77     2 Minute HR  76     3 Minute HR  76     4 Minute HR  76     5 Minute HR  124     6 Minute HR  112     2 Minute Post HR  84     Interval Heart Rate?  Yes       Interval Oxygen   Interval Oxygen?  Yes     Baseline Oxygen Saturation %  93 %     1 Minute Oxygen Saturation %  92 %     1 Minute Liters of Oxygen  0 L     2 Minute Oxygen Saturation %  88 %     2 Minute Liters of Oxygen  0 L     3 Minute Oxygen Saturation %  88 %     3 Minute Liters of Oxygen  0 L     4 Minute Oxygen Saturation %  88 %  4 Minute Liters of Oxygen  0 L     5 Minute Oxygen Saturation %  88 %     5 Minute Liters of Oxygen  0 L     6 Minute Oxygen Saturation %  89 %     6 Minute Liters of Oxygen  0 L     2 Minute Post Oxygen Saturation %  94 %     2 Minute Post Liters of Oxygen  0 L        Oxygen Initial Assessment: Oxygen Initial Assessment - 08/26/17 1017      Initial 6 min Walk   Oxygen Used  None      Program Oxygen Prescription   Program Oxygen Prescription  None       Oxygen  Re-Evaluation: Oxygen Re-Evaluation    Row Name 09/17/17 0917 10/17/17 0731           Program Oxygen Prescription   Program Oxygen Prescription  None  None        Home Oxygen   Home Oxygen Device  Home Concentrator  Home Concentrator      Sleep Oxygen Prescription  Continuous  Continuous      Liters per minute  2  2      Home Exercise Oxygen Prescription  None  None      Home at Rest Exercise Oxygen Prescription  None  None      Compliance with Home Oxygen Use  Yes  Yes        Goals/Expected Outcomes   Short Term Goals  To learn and exhibit compliance with exercise, home and travel O2 prescription;To learn and understand importance of maintaining oxygen saturations>88%;To learn and demonstrate proper use of respiratory medications;To learn and understand importance of monitoring SPO2 with pulse oximeter and demonstrate accurate use of the pulse oximeter.;To learn and demonstrate proper pursed lip breathing techniques or other breathing techniques.  To learn and exhibit compliance with exercise, home and travel O2 prescription;To learn and understand importance of maintaining oxygen saturations>88%;To learn and demonstrate proper use of respiratory medications;To learn and understand importance of monitoring SPO2 with pulse oximeter and demonstrate accurate use of the pulse oximeter.;To learn and demonstrate proper pursed lip breathing techniques or other breathing techniques.      Long  Term Goals  Exhibits compliance with exercise, home and travel O2 prescription;Verbalizes importance of monitoring SPO2 with pulse oximeter and return demonstration;Maintenance of O2 saturations>88%;Exhibits proper breathing techniques, such as pursed lip breathing or other method taught during program session;Compliance with respiratory medication;Demonstrates proper use of MDI's  Exhibits compliance with exercise, home and travel O2 prescription;Verbalizes importance of monitoring SPO2 with pulse oximeter  and return demonstration;Maintenance of O2 saturations>88%;Exhibits proper breathing techniques, such as pursed lip breathing or other method taught during program session;Compliance with respiratory medication;Demonstrates proper use of MDI's      Goals/Expected Outcomes  compliance with night o2  compliance with night o2         Oxygen Discharge (Final Oxygen Re-Evaluation): Oxygen Re-Evaluation - 10/17/17 0731      Program Oxygen Prescription   Program Oxygen Prescription  None      Home Oxygen   Home Oxygen Device  Home Concentrator    Sleep Oxygen Prescription  Continuous    Liters per minute  2    Home Exercise Oxygen Prescription  None    Home at Rest Exercise Oxygen Prescription  None    Compliance with Home Oxygen Use  Yes  Goals/Expected Outcomes   Short Term Goals  To learn and exhibit compliance with exercise, home and travel O2 prescription;To learn and understand importance of maintaining oxygen saturations>88%;To learn and demonstrate proper use of respiratory medications;To learn and understand importance of monitoring SPO2 with pulse oximeter and demonstrate accurate use of the pulse oximeter.;To learn and demonstrate proper pursed lip breathing techniques or other breathing techniques.    Long  Term Goals  Exhibits compliance with exercise, home and travel O2 prescription;Verbalizes importance of monitoring SPO2 with pulse oximeter and return demonstration;Maintenance of O2 saturations>88%;Exhibits proper breathing techniques, such as pursed lip breathing or other method taught during program session;Compliance with respiratory medication;Demonstrates proper use of MDI's    Goals/Expected Outcomes  compliance with night o2       Initial Exercise Prescription: Initial Exercise Prescription - 08/26/17 1000      Date of Initial Exercise RX and Referring Provider   Date  08/26/17    Referring Provider  [name]      Recumbant Bike   Level  1    Watts  10     Minutes  17      NuStep   Level  2    SPM  80    Minutes  17    METs  1.5      Track   Laps  6    Minutes  17      Prescription Details   Frequency (times per week)  2    Duration  Progress to 45 minutes of aerobic exercise without signs/symptoms of physical distress      Intensity   THRR 40-80% of Max Heartrate  64-128    Ratings of Perceived Exertion  11-13    Perceived Dyspnea  0-4      Progression   Progression  Continue progressive overload as per policy without signs/symptoms or physical distress.      Resistance Training   Training Prescription  Yes    Weight  blue bands    Reps  10-15       Perform Capillary Blood Glucose checks as needed.  Exercise Prescription Changes: Exercise Prescription Changes    Row Name 08/29/17 0921 09/17/17 1600 09/19/17 1001 10/01/17 1600 10/15/17 1600     Response to Exercise   Blood Pressure (Admit)  124/80  108/64  -  128/80  110/60   Blood Pressure (Exercise)  140/70  128/70  -  170/86  168/82   Blood Pressure (Exit)  130/76  102/60  -  124/70  116/68   Heart Rate (Admit)  68 bpm  75 bpm  -  67 bpm  75 bpm   Heart Rate (Exercise)  78 bpm  102 bpm  -  107 bpm  108 bpm   Heart Rate (Exit)  71 bpm  76 bpm  -  85 bpm  75 bpm   Oxygen Saturation (Admit)  96 %  91 %  -  95 %  94 %   Oxygen Saturation (Exercise)  94 %  92 %  -  91 %  90 %   Oxygen Saturation (Exit)  94 %  92 %  -  95 %  93 %   Rating of Perceived Exertion (Exercise)  10  13  -  13  13   Perceived Dyspnea (Exercise)  1  1  -  1  2   Duration  Progress to 45 minutes of aerobic exercise without signs/symptoms of physical distress  Continue with 45 min of aerobic exercise without signs/symptoms of physical distress.  -  Continue with 45 min of aerobic exercise without signs/symptoms of physical distress.  Continue with 45 min of aerobic exercise without signs/symptoms of physical distress.   Intensity  THRR unchanged  THRR unchanged  -  THRR unchanged  THRR unchanged      Progression   Progression  -  Continue to progress workloads to maintain intensity without signs/symptoms of physical distress.  -  Continue to progress workloads to maintain intensity without signs/symptoms of physical distress.  Continue to progress workloads to maintain intensity without signs/symptoms of physical distress.     Resistance Training   Training Prescription  Yes  Yes  -  Yes  No   Weight  blue bands  blue bands  -  blue bands  blue bands   Reps  10-15  10-15  -  10-15  10-15   Time  -  10 Minutes  -  10 Minutes  10 Minutes     Interval Training   Interval Training  -  -  -  Yes  Yes   Equipment  -  -  -  Bike;NuStep  Bike;NuStep     Treadmill   MPH  -  -  -  2.5  3.1   Grade  -  -  -  3  4   Minutes  -  -  -  17  17     Bike   Level  -  -  -  1.4 HITT  1.5 HITT   Minutes  -  -  -  17  17     Recumbant Bike   Level  1  4  -  -  -   Watts  10  10  -  -  -   Minutes  17  17  -  -  -     NuStep   Level  2  5  -  5  5 HITT   SPM  80  80  -  80  80   Minutes  17  17  -  17  17   METs  2  2  -  3.3  2.9     Track   Laps  -  24  -  -  -   Minutes  -  17  -  -  -     Home Exercise Plan   Plans to continue exercise at  -  -  Home (comment)  -  -   Frequency  -  -  Add 3 additional days to program exercise sessions.  -  -      Exercise Comments: Exercise Comments    Row Name 09/20/17 1000           Exercise Comments  Home exercise completed          Exercise Goals and Review: Exercise Goals    [place] Name 08/14/17 1023             Exercise Goals   Increase Physical Activity  Yes       Intervention  Provide advice, education, support and counseling about physical activity/exercise needs.;Develop an individualized exercise prescription for aerobic and resistive training based on initial evaluation findings, risk stratification, comorbidities and participant's personal goals.       Expected Outcomes  Short Term: Attend rehab on a regular basis to  increase amount of  physical activity.;Long Term: Exercising regularly at least 3-5 days a week.;Long Term: Add in home exercise to make exercise part of routine and to increase amount of physical activity.       Increase Strength and Stamina  Yes       Intervention  Provide advice, education, support and counseling about physical activity/exercise needs.;Develop an individualized exercise prescription for aerobic and resistive training based on initial evaluation findings, risk stratification, comorbidities and participant's personal goals.       Expected Outcomes  Short Term: Increase workloads from initial exercise prescription for resistance, speed, and METs.;Short Term: Perform resistance training exercises routinely during rehab and add in resistance training at home;Long Term: Improve cardiorespiratory fitness, muscular endurance and strength as measured by increased METs and functional capacity (6MWT)       Able to understand and use rate of perceived exertion (RPE) scale  Yes       Intervention  Provide education and explanation on how to use RPE scale       Expected Outcomes  Short Term: Able to use RPE daily in rehab to express subjective intensity level;Long Term:  Able to use RPE to guide intensity level when exercising independently       Able to understand and use Dyspnea scale  Yes       Intervention  Provide education and explanation on how to use Dyspnea scale       Expected Outcomes  Short Term: Able to use Dyspnea scale daily in rehab to express subjective sense of shortness of breath during exertion;Long Term: Able to use Dyspnea scale to guide intensity level when exercising independently       Knowledge and understanding of Target Heart Rate Range (THRR)  Yes       Intervention  Provide education and explanation of THRR including how the numbers were predicted and where they are located for reference       Expected Outcomes  Short Term: Able to state/look up THRR;Long Term: Able to  use THRR to govern intensity when exercising independently;Short Term: Able to use daily as guideline for intensity in rehab       Understanding of Exercise Prescription  Yes       Intervention  Provide education, explanation, and written materials on patient's individual exercise prescription       Expected Outcomes  Short Term: Able to explain program exercise prescription;Long Term: Able to explain home exercise prescription to exercise independently          Exercise Goals Re-Evaluation : Exercise Goals Re-Evaluation    Row Name 09/17/17 0917 10/17/17 0732           Exercise Goal Re-Evaluation   Exercise Goals Review  Understanding of Exercise Prescription;Knowledge and understanding of Target Heart Rate Range (THRR);Able to understand and use Dyspnea scale;Able to understand and use rate of perceived exertion (RPE) scale;Increase Strength and Stamina;Increase Physical Activity  Understanding of Exercise Prescription;Knowledge and understanding of Target Heart Rate Range (THRR);Able to understand and use Dyspnea scale;Able to understand and use rate of perceived exertion (RPE) scale;Increase Strength and Stamina;Increase Physical Activity      Comments  Patient has only attended 4 rehab sessions. Is progressing well. Is able to walk 16 laps (200 ft) in 15 minutes. Will discuss home exercise and how to progress intensities at rehab and at home.  Patient is progressing well. He is working on the HIIT protocol. Will encourage home exercise.       Expected Outcomes  Through exercise at rehab and at home, patient will increase physical capacity and be able to perform ADL's with ease. They will feel comfortable estblishing an exercise routine at home.   Through exercise at rehab and at home, patient will increase physical capacity and be able to perform ADL's with ease. They will feel comfortable estblishing an exercise routine at home.          Discharge Exercise Prescription (Final Exercise  Prescription Changes): Exercise Prescription Changes - 10/15/17 1600      Response to Exercise   Blood Pressure (Admit)  110/60    Blood Pressure (Exercise)  168/82    Blood Pressure (Exit)  116/68    Heart Rate (Admit)  75 bpm    Heart Rate (Exercise)  108 bpm    Heart Rate (Exit)  75 bpm    Oxygen Saturation (Admit)  94 %    Oxygen Saturation (Exercise)  90 %    Oxygen Saturation (Exit)  93 %    Rating of Perceived Exertion (Exercise)  13    Perceived Dyspnea (Exercise)  2    Duration  Continue with 45 min of aerobic exercise without signs/symptoms of physical distress.    Intensity  THRR unchanged      Progression   Progression  Continue to progress workloads to maintain intensity without signs/symptoms of physical distress.      Resistance Training   Training Prescription  No    Weight  blue bands    Reps  10-15    Time  10 Minutes      Interval Training   Interval Training  Yes    Equipment  Bike;NuStep      Treadmill   MPH  3.1    Grade  4    Minutes  17      Bike   Level  1.5   HITT   Minutes  17      NuStep   Level  5   HITT   SPM  80    Minutes  17    METs  2.9       Nutrition:  Target Goals: Understanding of nutrition guidelines, daily intake of sodium 1500mg , cholesterol 200mg , calories 30% from fat and 7% or less from saturated fats, daily to have 5 or more servings of fruits and vegetables.  Biometrics:    Nutrition Therapy Plan and Nutrition Goals: Nutrition Therapy & Goals - 08/14/17 1356      Nutrition Therapy   Diet  heart healthy      Personal Nutrition Goals   Nutrition Goal  identify and limit food sources of sodium    Personal Goal #2  identify food quantities necessary to achieve we loss of 6-24 lbs    Personal Goal #3  describe the benefit of including fruits, vegetables, whole grains, low fat dairy in a healthy meal plan      Intervention Plan   Intervention  Prescribe, educate and counsel regarding individualized specific  dietary modifications aiming towards targeted core components such as weight, hypertension, lipid management, diabetes, heart failure and other comorbidities.    Expected Outcomes  Short Term Goal: Understand basic principles of dietary content, such as calories, fat, sodium, cholesterol and nutrients.       Nutrition Assessments: Nutrition Assessments - 08/14/17 1400      Rate Your Plate Scores   Pre Score  40       Nutrition Goals Re-Evaluation: Nutrition Goals Re-Evaluation    Row  Name 09/12/17 1547             Goals   Nutrition Goal  identify and limit food sources of sodium, saturated fat, trans fat, refined carbohydrates         Personal Goal #2 Re-Evaluation   Personal Goal #2  identify food quantities necessary to achieve we loss of 6-24 lbs         Personal Goal #3 Re-Evaluation   Personal Goal #3  describe the benefit of including fruits, vegetables, whole grains, low fat dairy in a healthy meal plan          Nutrition Goals Discharge (Final Nutrition Goals Re-Evaluation): Nutrition Goals Re-Evaluation - 09/12/17 1547      Goals   Nutrition Goal  identify and limit food sources of sodium, saturated fat, trans fat, refined carbohydrates      Personal Goal #2 Re-Evaluation   Personal Goal #2  identify food quantities necessary to achieve we loss of 6-24 lbs      Personal Goal #3 Re-Evaluation   Personal Goal #3  describe the benefit of including fruits, vegetables, whole grains, low fat dairy in a healthy meal plan       Psychosocial: Target Goals: Acknowledge presence or absence of significant depression and/or stress, maximize coping skills, provide positive support system. Participant is able to verbalize types and ability to use techniques and skills needed for reducing stress and depression.  Initial Review & Psychosocial Screening: Initial Psych Review & Screening - 08/14/17 1025      Initial Review   Current issues with  None Identified       Family Dynamics   Good Support System?  Yes    Comments  supportive wife and children      Barriers   Psychosocial barriers to participate in program  There are no identifiable barriers or psychosocial needs.      Screening Interventions   Interventions  Encouraged to exercise    Expected Outcomes  Short Term goal: Identification and review with participant of any Quality of Life or Depression concerns found by scoring the questionnaire.;Long Term goal: The participant improves quality of Life and PHQ9 Scores as seen by post scores and/or verbalization of changes       Quality of Life Scores:  Scores of 19 and below usually indicate a poorer quality of life in these areas.  A difference of  2-3 points is a clinically meaningful difference.  A difference of 2-3 points in the total score of the Quality of Life Index has been associated with significant improvement in overall quality of life, self-image, physical symptoms, and general health in studies assessing change in quality of life.  PHQ-9: Recent Review Flowsheet Data    Depression screen [facility] 2/9 08/14/2017   Decreased Interest 0   Down, Depressed, Hopeless 0   PHQ - 2 Score 0   Altered sleeping 0   Tired, decreased energy 0   Change in appetite 0   Feeling bad or failure about yourself  0   Trouble concentrating 0   Moving slowly or fidgety/restless 0   Suicidal thoughts 0   PHQ-9 Score 0   Difficult doing work/chores Not difficult at all     Interpretation of Total Score  Total Score Depression Severity:  1-4 = Minimal depression, 5-9 = Mild depression, 10-14 = Moderate depression, 15-19 = Moderately severe depression, 20-27 = Severe depression   Psychosocial Evaluation and Intervention: Psychosocial Evaluation - 10/17/17 1102  Psychosocial Evaluation & Interventions   Interventions  Relaxation education;Stress management education;Encouraged to exercise with the program and follow exercise prescription    Comments   No Psychosocial Barriers.  Pt has a strong faith foundation    Expected Outcomes  Pt continue to rely on his fatih for mental well being.    Continue Psychosocial Services   Follow up required by staff       Psychosocial Re-Evaluation: Psychosocial Re-Evaluation    [place] Name 09/19/17 1702 10/17/17 1102           Psychosocial Re-Evaluation   Current issues with  None Identified  None Identified      Comments  Pt denies any barriers to participating in pulmonary rehab  Pt denies any barriers to participating in pulmonary rehab      Expected Outcomes  Pt will continue to display positive and healthy coping skill and resilant faith  Pt will continue to display positive and healthy coping skill and resilent faith      Interventions  Stress management education;Encouraged to attend Pulmonary Rehabilitation for the exercise;Relaxation education  Stress management education;Encouraged to attend Pulmonary Rehabilitation for the exercise;Relaxation education      Continue Psychosocial Services   Follow up required by staff  Follow up required by staff         Psychosocial Discharge (Final Psychosocial Re-Evaluation): Psychosocial Re-Evaluation - 10/17/17 1102      Psychosocial Re-Evaluation   Current issues with  None Identified    Comments  Pt denies any barriers to participating in pulmonary rehab    Expected Outcomes  Pt will continue to display positive and healthy coping skill and resilent faith    Interventions  Stress management education;Encouraged to attend Pulmonary Rehabilitation for the exercise;Relaxation education    Continue Psychosocial Services   Follow up required by staff       Education: Education Goals: Education classes will be provided on a weekly basis, covering required topics. Participant will state understanding/return demonstration of topics presented.  Learning Barriers/Preferences: Learning Barriers/Preferences - 08/14/17 1027      Learning  Barriers/Preferences   Learning Barriers  Sight    Learning Preferences  Computer/Internet;Group Instruction;Individual Instruction;Verbal Instruction;Video;Written Material       Education Topics: Risk Factor Reduction:  -Group instruction that is supported by a PowerPoint presentation. Instructor discusses the definition of a risk factor, different risk factors for pulmonary disease, and how the heart and lungs work together.     Nutrition for Pulmonary Patient:  -Group instruction provided by PowerPoint slides, verbal discussion, and written materials to support subject matter. The instructor gives an explanation and review of healthy diet recommendations, which includes a discussion on weight management, recommendations for fruit and vegetable consumption, as well as protein, fluid, caffeine, fiber, sodium, sugar, and alcohol. Tips for eating when patients are short of breath are discussed.   PULMONARY REHAB OTHER RESPIRATORY from 10/10/2017 in [place]  Date  08/29/17  Educator  Rodman Pickle  Instruction Review Code  2- Demonstrated Understanding      Pursed Lip Breathing:  -Group instruction that is supported by demonstration and informational handouts. Instructor discusses the benefits of pursed lip and diaphragmatic breathing and detailed demonstration on how to preform both.     Oxygen Safety:  -Group instruction provided by PowerPoint, verbal discussion, and written material to support subject matter. There is an overview of "What is Oxygen" and "Why do we need it".  Instructor also reviews how to  create a safe environment for oxygen use, the importance of using oxygen as prescribed, and the risks of noncompliance. There is a brief discussion on traveling with oxygen and resources the patient may utilize.   PULMONARY REHAB OTHER RESPIRATORY from 10/10/2017 in [place]  Date  10/03/17  Educator  EP  Instruction Review  Code  1- Verbalizes Understanding      Oxygen Equipment:  -Group instruction provided by [employer] utilizing handouts, written materials, and Insurance underwriter.   PULMONARY REHAB OTHER RESPIRATORY from 10/10/2017 in [place]  Date  10/10/17  Educator  [employer]  Instruction Review Code  1- Verbalizes Understanding      Signs and Symptoms:  -Group instruction provided by written material and verbal discussion to support subject matter. Warning signs and symptoms of infection, stroke, and heart attack are reviewed and when to call the physician/911 reinforced. Tips for preventing the spread of infection discussed.   Advanced Directives:  -Group instruction provided by verbal instruction and written material to support subject matter. Instructor reviews Advanced Directive laws and proper instruction for filling out document.   Pulmonary Video:  -Group video education that reviews the importance of medication and oxygen compliance, exercise, good nutrition, pulmonary hygiene, and pursed lip and diaphragmatic breathing for the pulmonary patient.   Exercise for the Pulmonary Patient:  -Group instruction that is supported by a PowerPoint presentation. Instructor discusses benefits of exercise, core components of exercise, frequency, duration, and intensity of an exercise routine, importance of utilizing pulse oximetry during exercise, safety while exercising, and options of places to exercise outside of rehab.     Pulmonary Medications:  -Verbally interactive group education provided by instructor with focus on inhaled medications and proper administration.   PULMONARY REHAB OTHER RESPIRATORY from 10/10/2017 in [place]  Date  09/10/17  Educator  Pharmacy  Instruction Review Code  1- Verbalizes Understanding      Anatomy and Physiology of the Respiratory System and Intimacy:  -Group instruction provided  by PowerPoint, verbal discussion, and written material to support subject matter. Instructor reviews respiratory cycle and anatomical components of the respiratory system and their functions. Instructor also reviews differences in obstructive and restrictive respiratory diseases with examples of each. Intimacy, Sex, and Sexuality differences are reviewed with a discussion on how relationships can change when diagnosed with pulmonary disease. Common sexual concerns are reviewed.   MD DAY -A group question and answer session with a medical doctor that allows participants to ask questions that relate to their pulmonary disease state.   OTHER EDUCATION -Group or individual verbal, written, or video instructions that support the educational goals of the pulmonary rehab program.   PULMONARY REHAB OTHER RESPIRATORY from 10/10/2017 in [place]  Date  09/19/17  Educator  Cloyde Reams  Instruction Review Code  1- Verbalizes Understanding [Sedentary Lifestyle]      Holiday Eating Survival Tips:  -Group instruction provided by [employer], verbal discussion, and written materials to support subject matter. The instructor gives patients tips, tricks, and techniques to help them not only survive but enjoy the holidays despite the onslaught of food that accompanies the holidays.   Knowledge Questionnaire Score: Knowledge Questionnaire Score - 08/21/17 1144      Knowledge Questionnaire Score   Pre Score  16/18       Core Components/Risk Factors/Patient Goals at Admission: Personal Goals and Risk Factors at Admission - 08/14/17 1029  Core Components/Risk Factors/Patient Goals on Admission    Weight Management  Weight Loss    Admit Weight  225 lb 15.5 oz (102.5 kg)    Goal Weight: Short Term  215 lb (97.5 kg)    Goal Weight: Long Term  200 lb (90.7 kg)    Expected Outcomes  Short Term: Continue to assess and modify interventions until short term weight is  achieved;Weight Loss: Understanding of general recommendations for a balanced deficit meal plan, which promotes 1-2 lb weight loss per week and includes a negative energy balance of [phone] kcal/d;Understanding of distribution of calorie intake throughout the day with the consumption of 4-5 meals/snacks;Understanding recommendations for meals to include 15-35% energy as protein, 25-35% energy from fat, 35-60% energy from carbohydrates, less than 200mg  of dietary cholesterol, 20-35 gm of total fiber daily    Improve shortness of breath with ADL's  Yes    Intervention  Provide education, individualized exercise plan and daily activity instruction to help decrease symptoms of SOB with activities of daily living.    Expected Outcomes  Short Term: Improve cardiorespiratory fitness to achieve a reduction of symptoms when performing ADLs;Long Term: Be able to perform more ADLs without symptoms or delay the onset of symptoms    Heart Failure  Yes    Intervention  Provide a combined exercise and nutrition program that is supplemented with education, support and counseling about heart failure. Directed toward relieving symptoms such as shortness of breath, decreased exercise tolerance, and extremity edema.    Expected Outcomes  Improve functional capacity of life;Short term: Attendance in program 2-3 days a week with increased exercise capacity. Reported lower sodium intake. Reported increased fruit and vegetable intake. Reports medication compliance.;Short term: Daily weights obtained and reported for increase. Utilizing diuretic protocols set by physician.;Long term: Adoption of self-care skills and reduction of barriers for early signs and symptoms recognition and intervention leading to self-care maintenance.    Hypertension  Yes    Intervention  Provide education on lifestyle modifcations including regular physical activity/exercise, weight management, moderate sodium restriction and increased consumption of  fresh fruit, vegetables, and low fat dairy, alcohol moderation, and smoking cessation.;Monitor prescription use compliance.    Expected Outcomes  Short Term: Continued assessment and intervention until BP is < 140/43mm HG in hypertensive participants. < 130/36mm HG in hypertensive participants with diabetes, heart failure or chronic kidney disease.;Long Term: Maintenance of blood pressure at goal levels.       Core Components/Risk Factors/Patient Goals Review:  Goals and Risk Factor Review    Row Name 09/19/17 1704 10/17/17 1102           Core Components/Risk Factors/Patient Goals Review   Personal Goals Review  Weight Management/Obesity;Heart Failure;Develop more efficient breathing techniques such as purse lipped breathing and diaphragmatic breathing and practicing self-pacing with activity.;Increase knowledge of respiratory medications and ability to use respiratory devices properly.;Hypertension;Improve shortness of breath with ADL's  Weight Management/Obesity;Heart Failure;Develop more efficient breathing techniques such as purse lipped breathing and diaphragmatic breathing and practicing self-pacing with activity.;Increase knowledge of respiratory medications and ability to use respiratory devices properly.;Hypertension;Improve shortness of breath with ADL's      Review  Pt is off to a great start with the completion of 6 exercise sessions.  Pt with weight loss of 3.3 kg,  Pt reports watching his diet, weighing daily and medication compliance.  Pt dypnea level ranges 0-1.  Pt workloads with averaging 24 laps around the track, recumbent bike increase to level 4 and  nustep increase to level 5.  I anticipate pt will continue to show progress toward goals in the next 30 day assessment  Pt has  completed of 12 exercise sessions and 5 education classes.  Pt with weight loss of 3.3 kg,  Pt reports watching his diet, weighing daily and medication compliance. Pt recently established care with cardiologist  for management of Heart Failure.  Medications were tweaked. Pt dypnea level ranges 1-2 Pt demonstrates PLB techniques independently.   Pt is engaging in HIIT and tolerates this well with bp and O2 saturation. Pt is on the treadmill 3.1/4, airdyne 1.5  and nustep increase to level 5.  I anticipate with the insertion of HIIT pt will continue to show progress toward goals in the next 30 day assessment      Expected Outcomes  See Admission Goals/Outcomes  See Admission Goals/Outcomes         Core Components/Risk Factors/Patient Goals at Discharge (Final Review):  Goals and Risk Factor Review - 10/17/17 1102      Core Components/Risk Factors/Patient Goals Review   Personal Goals Review  Weight Management/Obesity;Heart Failure;Develop more efficient breathing techniques such as purse lipped breathing and diaphragmatic breathing and practicing self-pacing with activity.;Increase knowledge of respiratory medications and ability to use respiratory devices properly.;Hypertension;Improve shortness of breath with ADL's    Review  Pt has  completed of 12 exercise sessions and 5 education classes.  Pt with weight loss of 3.3 kg,  Pt reports watching his diet, weighing daily and medication compliance. Pt recently established care with cardiologist for management of Heart Failure.  Medications were tweaked. Pt dypnea level ranges 1-2 Pt demonstrates PLB techniques independently.   Pt is engaging in HIIT and tolerates this well with bp and O2 saturation. Pt is on the treadmill 3.1/4, airdyne 1.5  and nustep increase to level 5.  I anticipate with the insertion of HIIT pt will continue to show progress toward goals in the next 30 day assessment    Expected Outcomes  See Admission Goals/Outcomes       ITP Comments: ITP Comments    Row Name 08/14/17 1002 09/19/17 1702 10/17/17 1101       ITP Comments  Dr. Jennet Maduro,  Medical Director  Dr. Jennet Maduro,  Medical Director Pulmonary Rehab  Dr. Jennet Maduro,  Medical  Director Pulmonary Rehab        Comments: Pt completed 13 exercise sessions. Cherre Huger, BSN Cardiac and Training and development officer

## 2017-10-22 ENCOUNTER — Encounter (HOSPITAL_COMMUNITY)
Admission: RE | Admit: 2017-10-22 | Discharge: 2017-10-22 | Disposition: A | Discharge status: Home / Self Care | Payer: 59 | Source: Ambulatory Visit | Attending: Pulmonary Disease | Admitting: Pulmonary Disease

## 2017-10-22 DIAGNOSIS — J9602 Acute respiratory failure with hypercapnia: Principal | ICD-10-CM

## 2017-10-22 DIAGNOSIS — J9601 Acute respiratory failure with hypoxia: Secondary | ICD-10-CM | POA: Diagnosis not present

## 2017-10-22 NOTE — Progress Notes (Signed)
Daily Session Note  Patient Details  Name: ABDULKAREEM BADOLATO MRN: [id number] Date of Birth: 1955/06/06 Referring Provider:     Pulmonary Rehab Walk Test from 08/22/2017 in [place]  Referring Provider  [name]      Encounter Date: 10/22/2017  Check In: Session Check In - 10/22/17 1326      Check-In   Supervising physician immediately available to respond to emergencies  [facility] immediately available    Physician(s)  Dr. Ree Kida    Location  [facility]    Staff Present  Hoy Register, MS, Exercise Physiologist;Lisa Ysidro Evert, Felipe Drone, RN, MHA;Molly DiVincenzo, MS, ACSM RCEP, Exercise Physiologist;Carlette Wilber Oliphant, RN, BSN    Medication changes reported      No    Fall or balance concerns reported     No    Tobacco Cessation  No Change    Warm-up and Cool-down  Performed as group-led instruction    Resistance Training Performed  Yes    VAD Patient?  No    PAD/SET Patient?  No      Pain Assessment   Currently in Pain?  No/denies    Multiple Pain Sites  No       Capillary Blood Glucose: No results found for this or any previous visit (from the past 24 hour(s)).    Social History   Tobacco Use  Smoking Status Never Smoker  Smokeless Tobacco Never Used    Goals Met:  Proper associated with RPD/PD & O2 Sat Exercise tolerated well  Goals Unmet:  Not Applicable  Comments: Service time is from 1330 to 1500.    [name] is Medical Director for Pulmonary Rehab at [facility].

## 2017-10-24 ENCOUNTER — Encounter (HOSPITAL_COMMUNITY): Payer: 59

## 2017-10-29 ENCOUNTER — Encounter (HOSPITAL_COMMUNITY)
Admission: RE | Admit: 2017-10-29 | Discharge: 2017-10-29 | Disposition: A | Discharge status: Home / Self Care | Payer: 59 | Source: Ambulatory Visit | Attending: Pulmonary Disease | Admitting: Pulmonary Disease

## 2017-10-29 VITALS — Wt 220.7 lb

## 2017-10-29 DIAGNOSIS — J9601 Acute respiratory failure with hypoxia: Secondary | ICD-10-CM

## 2017-10-29 DIAGNOSIS — J9602 Acute respiratory failure with hypercapnia: Principal | ICD-10-CM

## 2017-10-29 NOTE — Progress Notes (Signed)
Daily Session Note  Patient Details  Name: Louis [name] MRN: [id number] Date of Birth: [birth year] Referring Provider:     Pulmonary Rehab Walk Test from 08/22/2017 in [place]  Referring Provider  [name]      Encounter Date: 10/29/2017  Check In: Session Check In - 10/29/17 1330      Check-In   Supervising physician immediately available to respond to emergencies  [facility] immediately available    Physician(s)  Dr. Florene Glen    Location  [facility]    Staff Present  Rosebud Poles, RN, BSN;Carlette Wilber Oliphant, RN, BSN;Molly DiVincenzo, MS, ACSM RCEP, Exercise Physiologist;Dalton Kris Mouton, MS, Exercise Physiologist;Lisa Colletta [place], RN, MHA    Medication changes reported      No    Fall or balance concerns reported     No    Tobacco Cessation  No Change    Warm-up and Cool-down  Performed as group-led instruction    Resistance Training Performed  Yes    VAD Patient?  No    PAD/SET Patient?  No      Pain Assessment   Currently in Pain?  No/denies    Multiple Pain Sites  No       Capillary Blood Glucose: No results found for this or any previous visit (from the past 24 hour(s)).  Exercise Prescription Changes - 10/29/17 1500      Response to Exercise   Blood Pressure (Admit)  118/68    Blood Pressure (Exercise)  144/80    Blood Pressure (Exit)  116/68    Heart Rate (Admit)  80 bpm    Heart Rate (Exercise)  109 bpm    Heart Rate (Exit)  79 bpm    Oxygen Saturation (Admit)  95 %    Oxygen Saturation (Exercise)  90 %    Oxygen Saturation (Exit)  95 %    Rating of Perceived Exertion (Exercise)  13    Perceived Dyspnea (Exercise)  2    Duration  Continue with 45 min of aerobic exercise without signs/symptoms of physical distress.    Intensity  THRR unchanged      Progression   Progression  Continue to progress workloads to maintain intensity without signs/symptoms of physical distress.      Resistance Training   Training Prescription  No    Weight  blue bands    Reps  10-15    Time  10 Minutes      Interval Training   Interval Training  Yes    Equipment  Bike;NuStep      Treadmill   MPH  3.1    Grade  4    Minutes  17      Bike   Level  1.3    Minutes  17      NuStep   Level  5   HITT   SPM  80    Minutes  17       Social History   Tobacco Use  Smoking Status Never Smoker  Smokeless Tobacco Never Used    Goals Met:  Proper associated with RPD/PD & O2 Sat Exercise tolerated well  Goals Unmet:  Not Applicable  Comments: Service time is from 1330 to 1500    [name] is Medical Director for Pulmonary Rehab at [facility].

## 2017-10-30 DIAGNOSIS — J9612 Chronic respiratory failure with hypercapnia: Principal | ICD-10-CM

## 2017-10-30 DIAGNOSIS — J9611 Chronic respiratory failure with hypoxia: Secondary | ICD-10-CM

## 2017-10-31 ENCOUNTER — Encounter (HOSPITAL_COMMUNITY)
Admission: RE | Admit: 2017-10-31 | Discharge: 2017-10-31 | Disposition: A | Discharge status: Home / Self Care | Payer: 59 | Source: Ambulatory Visit | Attending: Pulmonary Disease | Admitting: Pulmonary Disease

## 2017-10-31 DIAGNOSIS — J9601 Acute respiratory failure with hypoxia: Secondary | ICD-10-CM

## 2017-10-31 DIAGNOSIS — J9602 Acute respiratory failure with hypercapnia: Principal | ICD-10-CM

## 2017-10-31 NOTE — Progress Notes (Signed)
Followed up with pt regarding home exercise. Pt reports that he has a Physiological scientist at [employer]. Pt reports that he goes for 30 minutes on Tuesdays and does piliates. Pt also goes on Saturdays for 1 hour and does resistance training with TRX bands and machines. Pt seems very happy with his workout regiment both here at pulmonary rehab and at [employer].   [place], MS Exercise Physiologist

## 2017-10-31 NOTE — Progress Notes (Signed)
Daily Session Note  Patient Details  Name: TRISTAIN DAILY MRN: [id number] Date of Birth: [birth year] Referring Provider:     Pulmonary Rehab Walk Test from 08/22/2017 in [place]  Referring Provider  [name]      Encounter Date: 10/31/2017  Check In: Session Check In - 10/31/17 1330      Check-In   Supervising physician immediately available to respond to emergencies  [facility] immediately available    Physician(s)  Dr. [name]    Location  [facility]    Staff Present  Rosebud Poles, RN, BSN;Carlette Wilber Oliphant, RN, BSN;Molly DiVincenzo, MS, ACSM RCEP, Exercise Physiologist;Dalton Kris Mouton, MS, Exercise Physiologist;Lisa Colletta [place], RN, MHA    Medication changes reported      No    Fall or balance concerns reported     No    Tobacco Cessation  No Change    Warm-up and Cool-down  Performed as group-led instruction    Resistance Training Performed  Yes    VAD Patient?  No    PAD/SET Patient?  No      Pain Assessment   Currently in Pain?  No/denies    Multiple Pain Sites  No       Capillary Blood Glucose: No results found for this or any previous visit (from the past 24 hour(s)).    Social History   Tobacco Use  Smoking Status Never Smoker  Smokeless Tobacco Never Used    Goals Met:  Proper associated with RPD/PD & O2 Sat Exercise tolerated well  Goals Unmet:  Not Applicable  Comments: Service time is from 1330 to 1520     [name] is Medical Director for Pulmonary Rehab at [facility].

## 2017-11-05 ENCOUNTER — Encounter (HOSPITAL_COMMUNITY)
Admission: RE | Admit: 2017-11-05 | Discharge: 2017-11-05 | Disposition: A | Discharge status: Home / Self Care | Payer: 59 | Source: Ambulatory Visit | Attending: Pulmonary Disease | Admitting: Pulmonary Disease

## 2017-11-05 DIAGNOSIS — I5189 Other ill-defined heart diseases: Secondary | ICD-10-CM | POA: Insufficient documentation

## 2017-11-05 DIAGNOSIS — J9601 Acute respiratory failure with hypoxia: Secondary | ICD-10-CM | POA: Diagnosis present

## 2017-11-05 DIAGNOSIS — J9602 Acute respiratory failure with hypercapnia: Secondary | ICD-10-CM | POA: Insufficient documentation

## 2017-11-05 NOTE — Progress Notes (Signed)
Daily Session Note  Patient Details  Name: Louis [name] MRN: [id number] Date of Birth: [birth year] Referring Provider:     Pulmonary Rehab Walk Test from 08/22/2017 in [place]  Referring Provider  [name]      Encounter Date: 11/05/2017  Check In: Session Check In - 11/05/17 1330      Check-In   Supervising physician immediately available to respond to emergencies  [facility] immediately available    Physician(s)  Dr. [name]    Location  [facility]    Staff Present  Rosebud Poles, RN, BSN;Carlette Wilber Oliphant, RN, Bjorn Loser, MS, Exercise Physiologist;Lisa Colletta [place], RN, MHA    Medication changes reported      No    Fall or balance concerns reported     No    Tobacco Cessation  No Change    Warm-up and Cool-down  Performed as group-led instruction    Resistance Training Performed  Yes    VAD Patient?  No    PAD/SET Patient?  No      Pain Assessment   Currently in Pain?  No/denies    Multiple Pain Sites  No       Capillary Blood Glucose: No results found for this or any previous visit (from the past 24 hour(s)).    Social History   Tobacco Use  Smoking Status Never Smoker  Smokeless Tobacco Never Used    Goals Met:  Proper associated with RPD/PD & O2 Sat Exercise tolerated well  Goals Unmet:  Not Applicable  Comments: Service time is from 1330 to 1500    [name] is Medical Director for Pulmonary Rehab at [facility].

## 2017-11-06 ENCOUNTER — Telehealth: Payer: Self-pay | Admitting: Pulmonary Disease

## 2017-11-06 MED ORDER — SPIRONOLACTONE 25 MG PO TABS: 25.0000 mg | ORAL_TABLET | Freq: Every day | ORAL | 3 refills | Status: DC

## 2017-11-06 NOTE — Telephone Encounter (Signed)
Called patient unable to reach left message to give us a call back.

## 2017-11-07 ENCOUNTER — Encounter (HOSPITAL_COMMUNITY)
Admission: RE | Admit: 2017-11-07 | Discharge: 2017-11-07 | Disposition: A | Discharge status: Home / Self Care | Payer: 59 | Source: Ambulatory Visit | Attending: Pulmonary Disease | Admitting: Pulmonary Disease

## 2017-11-07 DIAGNOSIS — J9601 Acute respiratory failure with hypoxia: Secondary | ICD-10-CM | POA: Diagnosis not present

## 2017-11-07 DIAGNOSIS — J9602 Acute respiratory failure with hypercapnia: Principal | ICD-10-CM

## 2017-11-07 NOTE — Progress Notes (Signed)
Daily Session Note  Patient Details  Name: Louis [name] MRN: [id number] Date of Birth: 1955-12-31 Referring Provider:     Pulmonary Rehab Walk Test from 08/22/2017 in [place]  Referring Provider  [name]      Encounter Date: 11/07/2017  Check In: Session Check In - 11/07/17 1330      Check-In   Supervising physician immediately available to respond to emergencies  [facility] immediately available    Physician(s)  Dr. Starla Link    Location  [facility]    Staff Present  Rosebud Poles, RN, BSN;Carlette Wilber Oliphant, RN, Bjorn Loser, MS, Exercise Physiologist;Annedrea Rosezella [place], RN, MHA    Medication changes reported      No    Fall or balance concerns reported     No    Tobacco Cessation  No Change    Warm-up and Cool-down  Performed as group-led instruction    Resistance Training Performed  Yes    VAD Patient?  No    PAD/SET Patient?  No      Pain Assessment   Currently in Pain?  No/denies    Multiple Pain Sites  No       Capillary Blood Glucose: No results found for this or any previous visit (from the past 24 hour(s)).    Social History   Tobacco Use  Smoking Status Never Smoker  Smokeless Tobacco Never Used    Goals Met:  Proper associated with RPD/PD & O2 Sat Exercise tolerated well  Goals Unmet:  Not Applicable  Comments: Service time is from 1330 to [place]    [name] is Medical Director for Pulmonary Rehab at [facility].

## 2017-11-07 NOTE — Telephone Encounter (Signed)
Patient calling to inquire regarding his oxygen order that was supposed to be sent to [facility]. Informed patient that the prescription was sent and confirmed via our [facility]. Voiced understanding. Left message with [facility] letting them know if they needed anything else to let us know. Nothing further is needed at this time.

## 2017-11-08 LAB — BASIC METABOLIC PANEL
BUN/Creatinine Ratio: 15 (ref 10–24)
BUN: 14 mg/dL (ref 8–27)
CO2: 23 mmol/L (ref 20–29)
Calcium: 9.9 mg/dL (ref 8.6–10.2)
Chloride: 96 mmol/L (ref 96–106)
Creatinine, Ser: 0.93 mg/dL (ref 0.76–1.27)
GFR calc Af Amer: 103 mL/min/{1.73_m2} (ref >59)
GFR calc non Af Amer: 89 mL/min/{1.73_m2} (ref >59)
Glucose: 122 mg/dL — ABNORMAL HIGH (ref 65–99)
Potassium: 5 mmol/L (ref 3.5–5.2)
Sodium: 138 mmol/L (ref 134–144)

## 2017-11-08 LAB — BRAIN NATRIURETIC PEPTIDE: BNP: 33 pg/mL (ref 0.0–100.0)

## 2017-11-11 ENCOUNTER — Telehealth: Payer: Self-pay | Admitting: Pulmonary Disease

## 2017-11-11 NOTE — Telephone Encounter (Signed)
Linnea at [place] [phone]); would like to know if patient is on continuous flow or pulse flow?

## 2017-11-11 NOTE — Telephone Encounter (Signed)
[facility] phone # [phone] and fax [phone] (patient gave the phone number as a second fax # first call and realized this was incorrect)

## 2017-11-11 NOTE — Telephone Encounter (Signed)
Called and spoke with Linnea, [facility].  She just needed to know if the Patient was on continuous or pulse. Patient is on continuous.  Nothing further at this time.

## 2017-11-11 NOTE — Telephone Encounter (Signed)
Called and spoke to [place] at [place]. They did not receive order for oxygen. Order refaxed to [phone]. Called Linnea back to ensure they received fax. Confirmed fax receipt. Called and informed patient that I spoke with [facility] and she confirmed that they did receive the fax. Patient voiced understanding and expressed thanks. Nothing further is needed at this time.

## 2017-11-12 ENCOUNTER — Encounter (HOSPITAL_COMMUNITY)
Admission: RE | Admit: 2017-11-12 | Discharge: 2017-11-12 | Disposition: A | Discharge status: Home / Self Care | Payer: 59 | Source: Ambulatory Visit | Attending: Pulmonary Disease | Admitting: Pulmonary Disease

## 2017-11-12 VITALS — Wt 220.9 lb

## 2017-11-12 DIAGNOSIS — J9601 Acute respiratory failure with hypoxia: Secondary | ICD-10-CM

## 2017-11-12 DIAGNOSIS — J9602 Acute respiratory failure with hypercapnia: Principal | ICD-10-CM

## 2017-11-12 NOTE — Progress Notes (Signed)
Daily Session Note  Patient Details  Name: LEBERT LOVERN MRN: [id number] Date of Birth: [birth year] Referring Provider:     Pulmonary Rehab Walk Test from 08/22/2017 in [facility]  Referring Provider  [name]      Encounter Date: 11/12/2017  Check In: Session Check In - 11/12/17 1330      Check-In   Supervising physician immediately available to respond to emergencies  [facility] immediately available    Physician(s)  Dr. Starla Link    Location  [facility]    Staff Present  Rosebud Poles, RN, BSN;Carlette Wilber Oliphant, RN, Bjorn Loser, MS, Exercise Physiologist;Zaydyn Havey Ysidro Evert, Felipe Drone, RN, MHA    Medication changes reported      No    Fall or balance concerns reported     No    Tobacco Cessation  No Change    Warm-up and Cool-down  Performed as group-led instruction    Resistance Training Performed  Yes    VAD Patient?  No    PAD/SET Patient?  No      Pain Assessment   Currently in Pain?  No/denies    Multiple Pain Sites  No       Capillary Blood Glucose: No results found for this or any previous visit (from the past 24 hour(s)).  Exercise Prescription Changes - 11/12/17 1600      Response to Exercise   Blood Pressure (Admit)  120/62    Blood Pressure (Exercise)  122/62    Blood Pressure (Exit)  100/56    Heart Rate (Admit)  96 bpm    Heart Rate (Exercise)  108 bpm    Heart Rate (Exit)  83 bpm    Oxygen Saturation (Admit)  96 %    Oxygen Saturation (Exercise)  93 %    Oxygen Saturation (Exit)  95 %    Rating of Perceived Exertion (Exercise)  13    Perceived Dyspnea (Exercise)  3    Duration  Continue with 45 min of aerobic exercise without signs/symptoms of physical distress.    Intensity  THRR unchanged      Progression   Progression  Continue to progress workloads to maintain intensity without signs/symptoms of physical distress.      Resistance Training   Training Prescription  Yes    Weight   blue bands    Reps  10-15    Time  10 Minutes      Interval Training   Interval Training  Yes    Equipment  Bike;NuStep      Treadmill   MPH  3.1    Grade  4    Minutes  17      Bike   Level  1.3    Minutes  17      NuStep   Level  7    SPM  80    Minutes  17    METs  2.7       Social History   Tobacco Use  Smoking Status Never Smoker  Smokeless Tobacco Never Used    Goals Met:  Exercise tolerated well No report of cardiac concerns or symptoms Strength training completed today  Goals Unmet:  Not Applicable  Comments: Service time is from 1330 to 1510    [name] is Medical Director for Pulmonary Rehab at [facility].

## 2017-11-13 NOTE — Progress Notes (Signed)
Pulmonary Individual Treatment Plan  Patient Details  Name: Louis [name] MRN: [id number] Date of Birth: 14-Aug-1955 Referring Provider:     Pulmonary Rehab Walk Test from 08/22/2017 in [place]  Referring Provider  [name]      Initial Encounter Date:    Pulmonary Rehab Walk Test from 08/22/2017 in [facility]  Date  08/26/17      Visit Diagnosis: Acute respiratory failure with hypoxia and hypercapnia ([place])  [name] Home Medications on Admission:   Current Outpatient Medications:  .  ALPRAZolam (XANAX) 0.5 MG tablet, Take 1 tablet (0.5 mg total) by mouth at bedtime as needed for sleep., Disp: 30 tablet, Rfl: 0 .  amLODipine (NORVASC) 5 MG tablet, Take 2.5 mg by mouth daily. Pt takes 1/2 tablet daily, Disp: , Rfl:  .  budesonide-formoterol (SYMBICORT) 160-4.5 MCG/ACT inhaler, Inhale 2 puffs into the lungs 2 (two) times daily., Disp: 1 Inhaler, Rfl: 5 .  losartan (COZAAR) 100 MG tablet, Take 1 tablet (100 mg total) by mouth daily., Disp: 90 tablet, Rfl: 0 .  metoprolol succinate (TOPROL-XL) 50 MG 24 hr tablet, TAKE 3 TABLETS BY MOUTH DAILY, Disp: 270 tablet, Rfl: 0 .  sildenafil (REVATIO) 20 MG tablet, Take 20 mg by mouth 3 (three) times daily as needed., Disp: , Rfl:  .  spironolactone (ALDACTONE) 25 MG tablet, Take 1 tablet (25 mg total) by mouth daily., Disp: 90 tablet, Rfl: 3 .  urea (CARMOL) 40 % CREA, apply to affected area twice a day for THICKEST AREAS if needed for dry skin, Disp: , Rfl: 0 .  Vitamin D, Ergocalciferol, (DRISDOL) 50000 UNITS CAPS capsule, Take 50,000 Units by mouth every 7 (seven) days., Disp: , Rfl:   Past Medical History: Past Medical History:  Diagnosis Date  . BPH (benign prostatic hyperplasia)    on flomax  . Bundle branch block    per prior PCP records  . Diverticulosis    by colonoscopy  . Eczema    per prior PCP records  . Elevated PSA 2015   peaked 6s, s/p benign biopsy 2014,  sees urology [name] ([place])  . Essential tremor   . GERD (gastroesophageal reflux disease)    per prior PCP records  . History of panic attacks    per prior PCP records  . HTN (hypertension)   . Mild intermittent asthma in adult without complication   . Obesity, Class I, BMI 30-34.9   . Osteoporosis    DEXA 06/2013 with osteopenia - h/o ?wrist/hip fracture from AVN from chronic steroid use (asthma), took reclast for 1 year  . Renal artery stenosis in 1 of 2 vessels ([place]) 2011   by [place]  . Seasonal allergies   . Thoracic scoliosis childhood  . Transaminitis    per prior PCP records    Tobacco Use: Social History   Tobacco Use  Smoking Status Never Smoker  Smokeless Tobacco Never Used    Labs: Recent Review Flowsheet Data    Labs for ITP Cardiac and Pulmonary Rehab Latest Ref Rng & Units 06/16/2013 10/11/2014 09/26/2015 07/02/2017   Cholestrol 0 - 200 mg/dL 204 204 214(H) -   LDLCALC 0 - 99 mg/dL 119 119 101(H) -   HDL >39.00 mg/dL 75 75 100.60 -   Trlycerides 0.0 - 149.0 mg/dL 51 51 60.0 -   PHART 7.350 - 7.450 - - - 7.305(L)   PCO2ART 32.0 - 48.0 mmHg - - - 72.4(HH)  HCO3 20.0 - 28.0 mmol/L - - - 35.0(H)   O2SAT % - - - 90.9      Capillary Blood Glucose: No results found for: GLUCAP   Pulmonary Assessment Scores: Pulmonary Assessment Scores    Row Name 08/26/17 1018         ADL UCSD   ADL Phase  Entry       mMRC Score   mMRC Score  0        Pulmonary Function Assessment:   Exercise Target Goals: Exercise Program Goal: Individual exercise prescription set using results from initial 6 min walk test and THRR while considering  patient's activity barriers and safety.   Exercise Prescription Goal: Initial exercise prescription builds to 30-45 minutes a day of aerobic activity, 2-3 days per week.  Home exercise guidelines will be given to patient during program as part of exercise prescription that the participant will acknowledge.  Activity Barriers &  Risk Stratification: Activity Barriers & Cardiac Risk Stratification - 08/14/17 1103      Activity Barriers & Cardiac Risk Stratification   Activity Barriers  Other (comment);Shortness of Breath;Back Problems;Arthritis    Comments  Scoliosis, right hip arthritis    Cardiac Risk Stratification  High       6 Minute Walk: 6 Minute Walk    Row Name 08/26/17 1018         6 Minute Walk   Phase  Initial     Distance  1200 feet     Walk Time  6 minutes     # of Rest Breaks  0     MPH  2.27     METS  2.68     RPE  12     Perceived Dyspnea   2     Symptoms  No     Resting HR  75 bpm     Resting BP  144/88     Resting Oxygen Saturation   93 %     Exercise Oxygen Saturation  during 6 min walk  88 %     Max Ex. HR  124 bpm     Max Ex. BP  170/80     2 Minute Post BP  142/84       Interval HR   1 Minute HR  77     2 Minute HR  76     3 Minute HR  76     4 Minute HR  76     5 Minute HR  124     6 Minute HR  112     2 Minute Post HR  84     Interval Heart Rate?  Yes       Interval Oxygen   Interval Oxygen?  Yes     Baseline Oxygen Saturation %  93 %     1 Minute Oxygen Saturation %  92 %     1 Minute Liters of Oxygen  0 L     2 Minute Oxygen Saturation %  88 %     2 Minute Liters of Oxygen  0 L     3 Minute Oxygen Saturation %  88 %     3 Minute Liters of Oxygen  0 L     4 Minute Oxygen Saturation %  88 %     4 Minute Liters of Oxygen  0 L     5 Minute Oxygen Saturation %  88 %     5 Minute  Liters of Oxygen  0 L     6 Minute Oxygen Saturation %  89 %     6 Minute Liters of Oxygen  0 L     2 Minute Post Oxygen Saturation %  94 %     2 Minute Post Liters of Oxygen  0 L        Oxygen Initial Assessment: Oxygen Initial Assessment - 08/26/17 1017      Initial 6 min Walk   Oxygen Used  None      Program Oxygen Prescription   Program Oxygen Prescription  None       Oxygen Re-Evaluation: Oxygen Re-Evaluation    Row Name 09/17/17 6269 10/17/17 0731 11/13/17 0901          Program Oxygen Prescription   Program Oxygen Prescription  None  None  None       Home Oxygen   Home Oxygen Device  Home Concentrator  Home Concentrator  Home Concentrator     Sleep Oxygen Prescription  Continuous  Continuous  Continuous     Liters per minute  2  2  2      Home Exercise Oxygen Prescription  None  None  None     Home at Rest Exercise Oxygen Prescription  None  None  None     Compliance with Home Oxygen Use  Yes  Yes  Yes       Goals/Expected Outcomes   Short Term Goals  To learn and exhibit compliance with exercise, home and travel O2 prescription;To learn and understand importance of maintaining oxygen saturations>88%;To learn and demonstrate proper use of respiratory medications;To learn and understand importance of monitoring SPO2 with pulse oximeter and demonstrate accurate use of the pulse oximeter.;To learn and demonstrate proper pursed lip breathing techniques or other breathing techniques.  To learn and exhibit compliance with exercise, home and travel O2 prescription;To learn and understand importance of maintaining oxygen saturations>88%;To learn and demonstrate proper use of respiratory medications;To learn and understand importance of monitoring SPO2 with pulse oximeter and demonstrate accurate use of the pulse oximeter.;To learn and demonstrate proper pursed lip breathing techniques or other breathing techniques.  To learn and exhibit compliance with exercise, home and travel O2 prescription;To learn and understand importance of maintaining oxygen saturations>88%;To learn and demonstrate proper use of respiratory medications;To learn and understand importance of monitoring SPO2 with pulse oximeter and demonstrate accurate use of the pulse oximeter.;To learn and demonstrate proper pursed lip breathing techniques or other breathing techniques.     Long  Term Goals  Exhibits compliance with exercise, home and travel O2 prescription;Verbalizes importance of  monitoring SPO2 with pulse oximeter and return demonstration;Maintenance of O2 saturations>88%;Exhibits proper breathing techniques, such as pursed lip breathing or other method taught during program session;Compliance with respiratory medication;Demonstrates proper use of MDI's  Exhibits compliance with exercise, home and travel O2 prescription;Verbalizes importance of monitoring SPO2 with pulse oximeter and return demonstration;Maintenance of O2 saturations>88%;Exhibits proper breathing techniques, such as pursed lip breathing or other method taught during program session;Compliance with respiratory medication;Demonstrates proper use of MDI's  Exhibits compliance with exercise, home and travel O2 prescription;Verbalizes importance of monitoring SPO2 with pulse oximeter and return demonstration;Maintenance of O2 saturations>88%;Exhibits proper breathing techniques, such as pursed lip breathing or other method taught during program session;Compliance with respiratory medication;Demonstrates proper use of MDI's     Goals/Expected Outcomes  compliance with night o2  compliance with night o2  compliance with night o2  Oxygen Discharge (Final Oxygen Re-Evaluation): Oxygen Re-Evaluation - 11/13/17 0901      Program Oxygen Prescription   Program Oxygen Prescription  None      Home Oxygen   Home Oxygen Device  Home Concentrator    Sleep Oxygen Prescription  Continuous    Liters per minute  2    Home Exercise Oxygen Prescription  None    Home at Rest Exercise Oxygen Prescription  None    Compliance with Home Oxygen Use  Yes      Goals/Expected Outcomes   Short Term Goals  To learn and exhibit compliance with exercise, home and travel O2 prescription;To learn and understand importance of maintaining oxygen saturations>88%;To learn and demonstrate proper use of respiratory medications;To learn and understand importance of monitoring SPO2 with pulse oximeter and demonstrate accurate use of the pulse  oximeter.;To learn and demonstrate proper pursed lip breathing techniques or other breathing techniques.    Long  Term Goals  Exhibits compliance with exercise, home and travel O2 prescription;Verbalizes importance of monitoring SPO2 with pulse oximeter and return demonstration;Maintenance of O2 saturations>88%;Exhibits proper breathing techniques, such as pursed lip breathing or other method taught during program session;Compliance with respiratory medication;Demonstrates proper use of MDI's    Goals/Expected Outcomes  compliance with night o2       Initial Exercise Prescription: Initial Exercise Prescription - 08/26/17 1000      Date of Initial Exercise RX and Referring Provider   Date  08/26/17    Referring Provider  [name]      Recumbant Bike   Level  1    Watts  10    Minutes  17      NuStep   Level  2    SPM  80    Minutes  17    METs  1.5      Track   Laps  6    Minutes  17      Prescription Details   Frequency (times per week)  2    Duration  Progress to 45 minutes of aerobic exercise without signs/symptoms of physical distress      Intensity   THRR 40-80% of Max Heartrate  64-128    Ratings of Perceived Exertion  11-13    Perceived Dyspnea  0-4      Progression   Progression  Continue progressive overload as per policy without signs/symptoms or physical distress.      Resistance Training   Training Prescription  Yes    Weight  blue bands    Reps  10-15       Perform Capillary Blood Glucose checks as needed.  Exercise Prescription Changes:  Exercise Prescription Changes    Row Name 08/29/17 0921 09/17/17 1600 09/19/17 1001 10/01/17 1600 10/15/17 1600     Response to Exercise   Blood Pressure (Admit)  124/80  108/64  -  128/80  110/60   Blood Pressure (Exercise)  140/70  128/70  -  170/86  168/82   Blood Pressure (Exit)  130/76  102/60  -  124/70  116/68   Heart Rate (Admit)  68 bpm  75 bpm  -  67 bpm  75 bpm   Heart Rate (Exercise)  78 bpm  102  bpm  -  107 bpm  108 bpm   Heart Rate (Exit)  71 bpm  76 bpm  -  85 bpm  75 bpm   Oxygen Saturation (Admit)  96 %  91 %  -  95 %  94 %   Oxygen Saturation (Exercise)  94 %  92 %  -  91 %  90 %   Oxygen Saturation (Exit)  94 %  92 %  -  95 %  93 %   Rating of Perceived Exertion (Exercise)  10  13  -  13  13   Perceived Dyspnea (Exercise)  1  1  -  1  2   Duration  Progress to 45 minutes of aerobic exercise without signs/symptoms of physical distress  Continue with 45 min of aerobic exercise without signs/symptoms of physical distress.  -  Continue with 45 min of aerobic exercise without signs/symptoms of physical distress.  Continue with 45 min of aerobic exercise without signs/symptoms of physical distress.   Intensity  THRR unchanged  THRR unchanged  -  THRR unchanged  THRR unchanged     Progression   Progression  -  Continue to progress workloads to maintain intensity without signs/symptoms of physical distress.  -  Continue to progress workloads to maintain intensity without signs/symptoms of physical distress.  Continue to progress workloads to maintain intensity without signs/symptoms of physical distress.     Resistance Training   Training Prescription  Yes  Yes  -  Yes  No   Weight  blue bands  blue bands  -  blue bands  blue bands   Reps  10-15  10-15  -  10-15  10-15   Time  -  10 Minutes  -  10 Minutes  10 Minutes     Interval Training   Interval Training  -  -  -  Yes  Yes   Equipment  -  -  -  Bike;NuStep  Bike;NuStep     Treadmill   MPH  -  -  -  2.5  3.1   Grade  -  -  -  3  4   Minutes  -  -  -  17  17     Bike   Level  -  -  -  1.4 HITT  1.5 HITT   Minutes  -  -  -  17  17     Recumbant Bike   Level  1  4  -  -  -   Watts  10  10  -  -  -   Minutes  17  17  -  -  -     NuStep   Level  2  5  -  5  5 HITT   SPM  80  80  -  80  80   Minutes  17  17  -  17  17   METs  2  2  -  3.3  2.9     Track   Laps  -  24  -  -  -   Minutes  -  17  -  -  -     Home  Exercise Plan   Plans to continue exercise at  -  -  Home (comment)  -  -   Frequency  -  -  Add 3 additional days to program exercise sessions.  -  -   Row Name 10/29/17 1500 11/12/17 1600           Response to Exercise   Blood Pressure (Admit)  118/68  120/62      Blood Pressure (Exercise)  144/80  122/62  Blood Pressure (Exit)  116/68  100/56      Heart Rate (Admit)  80 bpm  96 bpm      Heart Rate (Exercise)  109 bpm  108 bpm      Heart Rate (Exit)  79 bpm  83 bpm      Oxygen Saturation (Admit)  95 %  96 %      Oxygen Saturation (Exercise)  90 %  93 %      Oxygen Saturation (Exit)  95 %  95 %      Rating of Perceived Exertion (Exercise)  13  13      Perceived Dyspnea (Exercise)  2  3      Duration  Continue with 45 min of aerobic exercise without signs/symptoms of physical distress.  Continue with 45 min of aerobic exercise without signs/symptoms of physical distress.      Intensity  THRR unchanged  THRR unchanged        Progression   Progression  Continue to progress workloads to maintain intensity without signs/symptoms of physical distress.  Continue to progress workloads to maintain intensity without signs/symptoms of physical distress.        Resistance Training   Training Prescription  No  Yes      Weight  blue bands  blue bands      Reps  10-15  10-15      Time  10 Minutes  10 Minutes        Interval Training   Interval Training  Yes  Yes      Equipment  Bike;NuStep  Bike;NuStep        Treadmill   MPH  3.1  3.1      Grade  4  4      Minutes  17  17        Bike   Level  1.3  1.3      Minutes  17  17        NuStep   Level  5 HITT  7      SPM  80  80      Minutes  17  17      METs  -  2.7         Exercise Comments:  Exercise Comments    Row Name 09/20/17 1000           Exercise Comments  Home exercise completed          Exercise Goals and Review:  Exercise Goals    [place] Name 08/14/17 1023             Exercise Goals   Increase  Physical Activity  Yes       Intervention  Provide advice, education, support and counseling about physical activity/exercise needs.;Develop an individualized exercise prescription for aerobic and resistive training based on initial evaluation findings, risk stratification, comorbidities and participant's personal goals.       Expected Outcomes  Short Term: Attend rehab on a regular basis to increase amount of physical activity.;Long Term: Exercising regularly at least 3-5 days a week.;Long Term: Add in home exercise to make exercise part of routine and to increase amount of physical activity.       Increase Strength and Stamina  Yes       Intervention  Provide advice, education, support and counseling about physical activity/exercise needs.;Develop an individualized exercise prescription for aerobic and resistive training based on initial evaluation findings, risk stratification, comorbidities and  participant's personal goals.       Expected Outcomes  Short Term: Increase workloads from initial exercise prescription for resistance, speed, and METs.;Short Term: Perform resistance training exercises routinely during rehab and add in resistance training at home;Long Term: Improve cardiorespiratory fitness, muscular endurance and strength as measured by increased METs and functional capacity (6MWT)       Able to understand and use rate of perceived exertion (RPE) scale  Yes       Intervention  Provide education and explanation on how to use RPE scale       Expected Outcomes  Short Term: Able to use RPE daily in rehab to express subjective intensity level;Long Term:  Able to use RPE to guide intensity level when exercising independently       Able to understand and use Dyspnea scale  Yes       Intervention  Provide education and explanation on how to use Dyspnea scale       Expected Outcomes  Short Term: Able to use Dyspnea scale daily in rehab to express subjective sense of shortness of breath during  exertion;Long Term: Able to use Dyspnea scale to guide intensity level when exercising independently       Knowledge and understanding of Target Heart Rate Range (THRR)  Yes       Intervention  Provide education and explanation of THRR including how the numbers were predicted and where they are located for reference       Expected Outcomes  Short Term: Able to state/look up THRR;Long Term: Able to use THRR to govern intensity when exercising independently;Short Term: Able to use daily as guideline for intensity in rehab       Understanding of Exercise Prescription  Yes       Intervention  Provide education, explanation, and written materials on patient's individual exercise prescription       Expected Outcomes  Short Term: Able to explain program exercise prescription;Long Term: Able to explain home exercise prescription to exercise independently          Exercise Goals Re-Evaluation : Exercise Goals Re-Evaluation    Row Name 09/17/17 0917 10/17/17 0732 11/13/17 0901         Exercise Goal Re-Evaluation   Exercise Goals Review  Understanding of Exercise Prescription;Knowledge and understanding of Target Heart Rate Range (THRR);Able to understand and use Dyspnea scale;Able to understand and use rate of perceived exertion (RPE) scale;Increase Strength and Stamina;Increase Physical Activity  Understanding of Exercise Prescription;Knowledge and understanding of Target Heart Rate Range (THRR);Able to understand and use Dyspnea scale;Able to understand and use rate of perceived exertion (RPE) scale;Increase Strength and Stamina;Increase Physical Activity  Understanding of Exercise Prescription;Knowledge and understanding of Target Heart Rate Range (THRR);Able to understand and use Dyspnea scale;Able to understand and use rate of perceived exertion (RPE) scale;Increase Strength and Stamina;Increase Physical Activity     Comments  Patient has only attended 4 rehab sessions. Is progressing well. Is able to  walk 16 laps (200 ft) in 15 minutes. Will discuss home exercise and how to progress intensities at rehab and at home.  Patient is progressing well. He is working on the HIIT protocol. Will encourage home exercise.   Pt is progressing well. He is doing HIIT training on the bike. He is progressing on the stepper and treadmill as well. Pt is going to the gym twice a week as well for pilates and weight training. Pt is very motivated and plans to continue exercise after graduation.  Expected Outcomes  Through exercise at rehab and at home, patient will increase physical capacity and be able to perform ADL's with ease. They will feel comfortable estblishing an exercise routine at home.   Through exercise at rehab and at home, patient will increase physical capacity and be able to perform ADL's with ease. They will feel comfortable estblishing an exercise routine at home.   Through exercise at rehab and at home, patient will increase physical capacity and be able to perform ADL's with ease. They will feel comfortable estblishing an exercise routine at home.         Discharge Exercise Prescription (Final Exercise Prescription Changes): Exercise Prescription Changes - 11/12/17 1600      Response to Exercise   Blood Pressure (Admit)  120/62    Blood Pressure (Exercise)  122/62    Blood Pressure (Exit)  100/56    Heart Rate (Admit)  96 bpm    Heart Rate (Exercise)  108 bpm    Heart Rate (Exit)  83 bpm    Oxygen Saturation (Admit)  96 %    Oxygen Saturation (Exercise)  93 %    Oxygen Saturation (Exit)  95 %    Rating of Perceived Exertion (Exercise)  13    Perceived Dyspnea (Exercise)  3    Duration  Continue with 45 min of aerobic exercise without signs/symptoms of physical distress.    Intensity  THRR unchanged      Progression   Progression  Continue to progress workloads to maintain intensity without signs/symptoms of physical distress.      Resistance Training   Training Prescription  Yes     Weight  blue bands    Reps  10-15    Time  10 Minutes      Interval Training   Interval Training  Yes    Equipment  Bike;NuStep      Treadmill   MPH  3.1    Grade  4    Minutes  17      Bike   Level  1.3    Minutes  17      NuStep   Level  7    SPM  80    Minutes  17    METs  2.7       Nutrition:  Target Goals: Understanding of nutrition guidelines, daily intake of sodium 1500mg , cholesterol 200mg , calories 30% from fat and 7% or less from saturated fats, daily to have 5 or more servings of fruits and vegetables.  Biometrics:    Nutrition Therapy Plan and Nutrition Goals: Nutrition Therapy & Goals - 08/14/17 1356      Nutrition Therapy   Diet  heart healthy      Personal Nutrition Goals   Nutrition Goal  identify and limit food sources of sodium    Personal Goal #2  identify food quantities necessary to achieve we loss of 6-24 lbs    Personal Goal #3  describe the benefit of including fruits, vegetables, whole grains, low fat dairy in a healthy meal plan      Intervention Plan   Intervention  Prescribe, educate and counsel regarding individualized specific dietary modifications aiming towards targeted core components such as weight, hypertension, lipid management, diabetes, heart failure and other comorbidities.    Expected Outcomes  Short Term Goal: Understand basic principles of dietary content, such as calories, fat, sodium, cholesterol and nutrients.       Nutrition Assessments: Nutrition Assessments - 08/14/17 1400  Rate Your Plate Scores   Pre Score  40       Nutrition Goals Re-Evaluation: Nutrition Goals Re-Evaluation    [place] Name 09/12/17 1547             Goals   Nutrition Goal  identify and limit food sources of sodium, saturated fat, trans fat, refined carbohydrates         Personal Goal #2 Re-Evaluation   Personal Goal #2  identify food quantities necessary to achieve we loss of 6-24 lbs         Personal Goal #3 Re-Evaluation    Personal Goal #3  describe the benefit of including fruits, vegetables, whole grains, low fat dairy in a healthy meal plan          Nutrition Goals Discharge (Final Nutrition Goals Re-Evaluation): Nutrition Goals Re-Evaluation - 09/12/17 1547      Goals   Nutrition Goal  identify and limit food sources of sodium, saturated fat, trans fat, refined carbohydrates      Personal Goal #2 Re-Evaluation   Personal Goal #2  identify food quantities necessary to achieve we loss of 6-24 lbs      Personal Goal #3 Re-Evaluation   Personal Goal #3  describe the benefit of including fruits, vegetables, whole grains, low fat dairy in a healthy meal plan       Psychosocial: Target Goals: Acknowledge presence or absence of significant depression and/or stress, maximize coping skills, provide positive support system. Participant is able to verbalize types and ability to use techniques and skills needed for reducing stress and depression.  Initial Review & Psychosocial Screening: Initial Psych Review & Screening - 08/14/17 1025      Initial Review   Current issues with  None Identified      Family Dynamics   Good Support System?  Yes    Comments  supportive wife and children      Barriers   Psychosocial barriers to participate in program  There are no identifiable barriers or psychosocial needs.      Screening Interventions   Interventions  Encouraged to exercise    Expected Outcomes  Short Term goal: Identification and review with participant of any Quality of Life or Depression concerns found by scoring the questionnaire.;Long Term goal: The participant improves quality of Life and PHQ9 Scores as seen by post scores and/or verbalization of changes       Quality of Life Scores:  Scores of 19 and below usually indicate a poorer quality of life in these areas.  A difference of  2-3 points is a clinically meaningful difference.  A difference of 2-3 points in the total score of the Quality of Life  Index has been associated with significant improvement in overall quality of life, self-image, physical symptoms, and general health in studies assessing change in quality of life.  PHQ-9: Recent Review Flowsheet Data    Depression screen [facility] 2/9 08/14/2017   Decreased Interest 0   Down, Depressed, Hopeless 0   PHQ - 2 Score 0   Altered sleeping 0   Tired, decreased energy 0   Change in appetite 0   Feeling bad or failure about yourself  0   Trouble concentrating 0   Moving slowly or fidgety/restless 0   Suicidal thoughts 0   PHQ-9 Score 0   Difficult doing work/chores Not difficult at all     Interpretation of Total Score  Total Score Depression Severity:  1-4 = Minimal depression, 5-9 = Mild  depression, 10-14 = Moderate depression, 15-19 = Moderately severe depression, 20-27 = Severe depression   Psychosocial Evaluation and Intervention: Psychosocial Evaluation - 11/13/17 1311      Psychosocial Evaluation & Interventions   Interventions  Relaxation education;Stress management education;Encouraged to exercise with the program and follow exercise prescription    Comments  No Psychosocial Barriers.  Pt has a strong faith foundation    Expected Outcomes  Pt continue to rely on his fatih for mental well being.    Continue Psychosocial Services   Follow up required by staff       Psychosocial Re-Evaluation: Psychosocial Re-Evaluation    [place] Name 09/19/17 1702 10/17/17 1102 11/13/17 1311         Psychosocial Re-Evaluation   Current issues with  None Identified  None Identified  -     Comments  Pt denies any barriers to participating in pulmonary rehab  Pt denies any barriers to participating in pulmonary rehab  Pt denies any barriers to participating in pulmonary rehab     Expected Outcomes  Pt will continue to display positive and healthy coping skill and resilant faith  Pt will continue to display positive and healthy coping skill and resilent faith  Pt will continue to display  positive and healthy coping skill and resilent faith     Interventions  Stress management education;Encouraged to attend Pulmonary Rehabilitation for the exercise;Relaxation education  Stress management education;Encouraged to attend Pulmonary Rehabilitation for the exercise;Relaxation education  Stress management education;Encouraged to attend Pulmonary Rehabilitation for the exercise;Relaxation education     Continue Psychosocial Services   Follow up required by staff  Follow up required by staff  Follow up required by staff        Psychosocial Discharge (Final Psychosocial Re-Evaluation): Psychosocial Re-Evaluation - 11/13/17 1311      Psychosocial Re-Evaluation   Comments  Pt denies any barriers to participating in pulmonary rehab    Expected Outcomes  Pt will continue to display positive and healthy coping skill and resilent faith    Interventions  Stress management education;Encouraged to attend Pulmonary Rehabilitation for the exercise;Relaxation education    Continue Psychosocial Services   Follow up required by staff       Education: Education Goals: Education classes will be provided on a weekly basis, covering required topics. Participant will state understanding/return demonstration of topics presented.  Learning Barriers/Preferences: Learning Barriers/Preferences - 08/14/17 1027      Learning Barriers/Preferences   Learning Barriers  Sight    Learning Preferences  Computer/Internet;Group Instruction;Individual Instruction;Verbal Instruction;Video;Written Material       Education Topics: Risk Factor Reduction:  -Group instruction that is supported by a PowerPoint presentation. Instructor discusses the definition of a risk factor, different risk factors for pulmonary disease, and how the heart and lungs work together.     Nutrition for Pulmonary Patient:  -Group instruction provided by PowerPoint slides, verbal discussion, and written materials to support subject matter.  The instructor gives an explanation and review of healthy diet recommendations, which includes a discussion on weight management, recommendations for fruit and vegetable consumption, as well as protein, fluid, caffeine, fiber, sodium, sugar, and alcohol. Tips for eating when patients are short of breath are discussed.   PULMONARY REHAB OTHER RESPIRATORY from 11/07/2017 in [place]  Date  08/29/17  Educator  Rodman Pickle  Instruction Review Code  2- Demonstrated Understanding      Pursed Lip Breathing:  -Group instruction that is supported by demonstration and informational  handouts. Instructor discusses the benefits of pursed lip and diaphragmatic breathing and detailed demonstration on how to preform both.     Oxygen Safety:  -Group instruction provided by PowerPoint, verbal discussion, and written material to support subject matter. There is an overview of "What is Oxygen" and "Why do we need it".  Instructor also reviews how to create a safe environment for oxygen use, the importance of using oxygen as prescribed, and the risks of noncompliance. There is a brief discussion on traveling with oxygen and resources the patient may utilize.   PULMONARY REHAB OTHER RESPIRATORY from 11/07/2017 in [place]  Date  10/03/17  Educator  EP  Instruction Review Code  1- Verbalizes Understanding      Oxygen Equipment:  -Group instruction provided by [employer] utilizing handouts, written materials, and Insurance underwriter.   PULMONARY REHAB OTHER RESPIRATORY from 11/07/2017 in [place]  Date  10/10/17  Educator  [employer]  Instruction Review Code  1- Verbalizes Understanding      Signs and Symptoms:  -Group instruction provided by written material and verbal discussion to support subject matter. Warning signs and symptoms of infection, stroke, and heart attack are reviewed and when to call the  physician/911 reinforced. Tips for preventing the spread of infection discussed.   Advanced Directives:  -Group instruction provided by verbal instruction and written material to support subject matter. Instructor reviews Advanced Directive laws and proper instruction for filling out document.   Pulmonary Video:  -Group video education that reviews the importance of medication and oxygen compliance, exercise, good nutrition, pulmonary hygiene, and pursed lip and diaphragmatic breathing for the pulmonary patient.   Exercise for the Pulmonary Patient:  -Group instruction that is supported by a PowerPoint presentation. Instructor discusses benefits of exercise, core components of exercise, frequency, duration, and intensity of an exercise routine, importance of utilizing pulse oximetry during exercise, safety while exercising, and options of places to exercise outside of rehab.     PULMONARY REHAB OTHER RESPIRATORY from 11/07/2017 in [place]  Date  10/17/17  Educator  Cloyde Reams  Instruction Review Code  1- Verbalizes Understanding      Pulmonary Medications:  -Verbally interactive group education provided by instructor with focus on inhaled medications and proper administration.   PULMONARY REHAB OTHER RESPIRATORY from 11/07/2017 in [place]  Date  09/10/17  Educator  Pharmacy  Instruction Review Code  1- Verbalizes Understanding      Anatomy and Physiology of the Respiratory System and Intimacy:  -Group instruction provided by PowerPoint, verbal discussion, and written material to support subject matter. Instructor reviews respiratory cycle and anatomical components of the respiratory system and their functions. Instructor also reviews differences in obstructive and restrictive respiratory diseases with examples of each. Intimacy, Sex, and Sexuality differences are reviewed with a discussion on how relationships can change  when diagnosed with pulmonary disease. Common sexual concerns are reviewed.   PULMONARY REHAB OTHER RESPIRATORY from 11/07/2017 in [place]  Date  11/07/17  Educator  RN  Instruction Review Code  1- Verbalizes Understanding      MD DAY -A group question and answer session with a medical doctor that allows participants to ask questions that relate to their pulmonary disease state.   OTHER EDUCATION -Group or individual verbal, written, or video instructions that support the educational goals of the pulmonary rehab program.   PULMONARY REHAB  OTHER RESPIRATORY from 11/07/2017 in [place]  Date  09/19/17  Educator  Cloyde Reams  Instruction Review Code  1- Verbalizes Understanding [Sedentary Lifestyle]      Holiday Eating Survival Tips:  -Group instruction provided by [employer], verbal discussion, and written materials to support subject matter. The instructor gives patients tips, tricks, and techniques to help them not only survive but enjoy the holidays despite the onslaught of food that accompanies the holidays.   Knowledge Questionnaire Score:   Core Components/Risk Factors/Patient Goals at Admission: Personal Goals and Risk Factors at Admission - 08/14/17 1029      Core Components/Risk Factors/Patient Goals on Admission    Weight Management  Weight Loss    Admit Weight  225 lb 15.5 oz (102.5 kg)    Goal Weight: Short Term  215 lb (97.5 kg)    Goal Weight: Long Term  200 lb (90.7 kg)    Expected Outcomes  Short Term: Continue to assess and modify interventions until short term weight is achieved;Weight Loss: Understanding of general recommendations for a balanced deficit meal plan, which promotes 1-2 lb weight loss per week and includes a negative energy balance of [id number] kcal/d;Understanding of distribution of calorie intake throughout the day with the consumption of 4-5 meals/snacks;Understanding  recommendations for meals to include 15-35% energy as protein, 25-35% energy from fat, 35-60% energy from carbohydrates, less than 200mg  of dietary cholesterol, 20-35 gm of total fiber daily    Improve shortness of breath with ADL's  Yes    Intervention  Provide education, individualized exercise plan and daily activity instruction to help decrease symptoms of SOB with activities of daily living.    Expected Outcomes  Short Term: Improve cardiorespiratory fitness to achieve a reduction of symptoms when performing ADLs;Long Term: Be able to perform more ADLs without symptoms or delay the onset of symptoms    Heart Failure  Yes    Intervention  Provide a combined exercise and nutrition program that is supplemented with education, support and counseling about heart failure. Directed toward relieving symptoms such as shortness of breath, decreased exercise tolerance, and extremity edema.    Expected Outcomes  Improve functional capacity of life;Short term: Attendance in program 2-3 days a week with increased exercise capacity. Reported lower sodium intake. Reported increased fruit and vegetable intake. Reports medication compliance.;Short term: Daily weights obtained and reported for increase. Utilizing diuretic protocols set by physician.;Long term: Adoption of self-care skills and reduction of barriers for early signs and symptoms recognition and intervention leading to self-care maintenance.    Hypertension  Yes    Intervention  Provide education on lifestyle modifcations including regular physical activity/exercise, weight management, moderate sodium restriction and increased consumption of fresh fruit, vegetables, and low fat dairy, alcohol moderation, and smoking cessation.;Monitor prescription use compliance.    Expected Outcomes  Short Term: Continued assessment and intervention until BP is < 140/85mm HG in hypertensive participants. < 130/69mm HG in hypertensive participants with diabetes, heart  failure or chronic kidney disease.;Long Term: Maintenance of blood pressure at goal levels.       Core Components/Risk Factors/Patient Goals Review:  Goals and Risk Factor Review    Row Name 09/19/17 1704 10/17/17 1102 11/13/17 1311         Core Components/Risk Factors/Patient Goals Review   Personal Goals Review  Weight Management/Obesity;Heart Failure;Develop more efficient breathing techniques such as purse lipped breathing and diaphragmatic breathing and practicing self-pacing with activity.;Increase knowledge of respiratory medications and ability to  use respiratory devices properly.;Hypertension;Improve shortness of breath with ADL's  Weight Management/Obesity;Heart Failure;Develop more efficient breathing techniques such as purse lipped breathing and diaphragmatic breathing and practicing self-pacing with activity.;Increase knowledge of respiratory medications and ability to use respiratory devices properly.;Hypertension;Improve shortness of breath with ADL's  Weight Management/Obesity;Heart Failure;Develop more efficient breathing techniques such as purse lipped breathing and diaphragmatic breathing and practicing self-pacing with activity.;Increase knowledge of respiratory medications and ability to use respiratory devices properly.;Hypertension;Improve shortness of breath with ADL's     Review  Pt is off to a great start with the completion of 6 exercise sessions.  Pt with weight loss of 3.3 kg,  Pt reports watching his diet, weighing daily and medication compliance.  Pt dypnea level ranges 0-1.  Pt workloads with averaging 24 laps around the track, recumbent bike increase to level 4 and nustep increase to level 5.  I anticipate pt will continue to show progress toward goals in the next 30 day assessment  Pt has  completed of 12 exercise sessions and 5 education classes.  Pt with weight loss of 3.3 kg,  Pt reports watching his diet, weighing daily and medication compliance. Pt recently  established care with cardiologist for management of Heart Failure.  Medications were tweaked. Pt dypnea level ranges 1-2 Pt demonstrates PLB techniques independently.   Pt is engaging in HIIT and tolerates this well with bp and O2 saturation. Pt is on the treadmill 3.1/4, airdyne 1.5  and nustep increase to level 5.  I anticipate with the insertion of HIIT pt will continue to show progress toward goals in the next 30 day assessment  Pt has  completed of 19 exercise sessions and 7education classes.  Pt with weight loss of 13.4 kg,  Pt reports watching his sodium intake, weighing daily and medication compliance. Pt recently established care with cardiologist for management of Heart Failure.  Medications were tweaked. Pt dypnea level ranges 1-2 Pt demonstrates PLB techniques independently.   Pt is engaging in HIIT and tolerates this well with bp and O2 saturation. Pt is on the treadmill 3.1/4, airdyne and nustep increase to level 7  I anticipate with the insertion of HIIT pt will continue to show progress toward goals in the next 30 day assessment     Expected Outcomes  See Admission Goals/Outcomes  See Admission Goals/Outcomes  See Admission Goals/Outcomes        Core Components/Risk Factors/Patient Goals at Discharge (Final Review):  Goals and Risk Factor Review - 11/13/17 1311      Core Components/Risk Factors/Patient Goals Review   Personal Goals Review  Weight Management/Obesity;Heart Failure;Develop more efficient breathing techniques such as purse lipped breathing and diaphragmatic breathing and practicing self-pacing with activity.;Increase knowledge of respiratory medications and ability to use respiratory devices properly.;Hypertension;Improve shortness of breath with ADL's    Review  Pt has  completed of 19 exercise sessions and 7education classes.  Pt with weight loss of 13.4 kg,  Pt reports watching his sodium intake, weighing daily and medication compliance. Pt recently established care with  cardiologist for management of Heart Failure.  Medications were tweaked. Pt dypnea level ranges 1-2 Pt demonstrates PLB techniques independently.   Pt is engaging in HIIT and tolerates this well with bp and O2 saturation. Pt is on the treadmill 3.1/4, airdyne and nustep increase to level 7  I anticipate with the insertion of HIIT pt will continue to show progress toward goals in the next 30 day assessment    Expected Outcomes  See  Admission Goals/Outcomes       ITP Comments: ITP Comments    Row Name 08/14/17 1002 09/19/17 1702 10/17/17 1101 11/13/17 1311     ITP Comments  Dr. Jennet Maduro,  Medical Director  Dr. Jennet Maduro,  Medical Director Pulmonary Rehab  Dr. Jennet Maduro,  Medical Director Pulmonary Rehab  Dr. Jennet Maduro,  Medical Director Pulmonary Rehab       Comments: Pt has completed 19 exercise sessions.  Pt is nearing graduation.  Continue to monitor pt progress. Cherre Huger, BSN Cardiac and Training and development officer

## 2017-11-14 ENCOUNTER — Encounter (HOSPITAL_COMMUNITY)
Admission: RE | Admit: 2017-11-14 | Discharge: 2017-11-14 | Disposition: A | Discharge status: Home / Self Care | Payer: 59 | Source: Ambulatory Visit | Attending: Pulmonary Disease | Admitting: Pulmonary Disease

## 2017-11-14 DIAGNOSIS — J9602 Acute respiratory failure with hypercapnia: Principal | ICD-10-CM

## 2017-11-14 DIAGNOSIS — J9601 Acute respiratory failure with hypoxia: Secondary | ICD-10-CM | POA: Diagnosis not present

## 2017-11-14 NOTE — Progress Notes (Signed)
I have reviewed the pulmonary graduate packet with Louis [name] . Louis [name] is scheduled to graduate on 11/26/17. The patient was given an exercise prescription and stated that they were going to continue exercise at [employer] with a personal trainer and do resistance training, pilates, and cardiorespiratory exercise.  Louis [name] and I discussed how to progress their exercise prescription.  The patient stated that they understand the exercise prescription.  We reviewed exercise guidelines, target heart rate during exercise, RPE Scale, weather conditions, NTG use, endpoints for exercise, warmup and cool down.  The patient was also given information regarding the pulmonary maintenance program and given homework to complete and return before graduation. Patient is encouraged to come to me with any questions.

## 2017-11-14 NOTE — Progress Notes (Signed)
Daily Session Note  Patient Details  Name: Louis [name] MRN: [id number] Date of Birth: 07-21-1955 Referring Provider:     Pulmonary Rehab Walk Test from 08/22/2017 in [place]  Referring Provider  [name]      Encounter Date: 11/14/2017  Check In: Session Check In - 11/14/17 1330      Check-In   Supervising physician immediately available to respond to emergencies  [facility] immediately available    Physician(s)  Dr. [name]    Location  [facility]    Staff Present  Rosebud Poles, RN, BSN;Carlette Wilber Oliphant, RN, Bjorn Loser, MS, Exercise Physiologist;Lisa Ysidro Evert, RN    Medication changes reported      No    Fall or balance concerns reported     No    Tobacco Cessation  No Change    Warm-up and Cool-down  Performed as group-led instruction    Resistance Training Performed  Yes    VAD Patient?  No    PAD/SET Patient?  No      Pain Assessment   Currently in Pain?  No/denies       Capillary Blood Glucose: No results found for this or any previous visit (from the past 24 hour(s)).    Social History   Tobacco Use  Smoking Status Never Smoker  Smokeless Tobacco Never Used    Goals Met:  Proper associated with RPD/PD & [employer] Sat Exercise tolerated well  Goals Unmet:  Not Applicable  Comments: Service time is from 1330 to [place]   [name] is Medical Director for Pulmonary Rehab at [facility].

## 2017-11-19 ENCOUNTER — Encounter (HOSPITAL_COMMUNITY): Payer: 59

## 2017-11-21 ENCOUNTER — Encounter (HOSPITAL_COMMUNITY): Payer: 59

## 2017-11-26 ENCOUNTER — Encounter (HOSPITAL_COMMUNITY)
Admission: RE | Admit: 2017-11-26 | Discharge: 2017-11-26 | Disposition: A | Discharge status: Home / Self Care | Payer: 59 | Source: Ambulatory Visit | Attending: Pulmonary Disease | Admitting: Pulmonary Disease

## 2017-11-26 DIAGNOSIS — J9601 Acute respiratory failure with hypoxia: Secondary | ICD-10-CM

## 2017-11-26 DIAGNOSIS — J9602 Acute respiratory failure with hypercapnia: Principal | ICD-10-CM

## 2017-12-03 ENCOUNTER — Encounter (HOSPITAL_COMMUNITY): Payer: 59

## 2017-12-10 ENCOUNTER — Ambulatory Visit (INDEPENDENT_AMBULATORY_CARE_PROVIDER_SITE_OTHER): Payer: 59 | Admitting: Cardiovascular Disease

## 2017-12-10 ENCOUNTER — Encounter: Payer: Self-pay | Admitting: Cardiovascular Disease

## 2017-12-10 VITALS — BP 118/68 | HR 72 | Ht 71.0 in | Wt 223.8 lb

## 2017-12-10 DIAGNOSIS — I10 Essential (primary) hypertension: Secondary | ICD-10-CM | POA: Diagnosis not present

## 2017-12-10 DIAGNOSIS — I5032 Chronic diastolic (congestive) heart failure: Secondary | ICD-10-CM

## 2017-12-10 DIAGNOSIS — I519 Heart disease, unspecified: Secondary | ICD-10-CM | POA: Diagnosis not present

## 2017-12-10 DIAGNOSIS — G4733 Obstructive sleep apnea (adult) (pediatric): Secondary | ICD-10-CM | POA: Diagnosis not present

## 2017-12-10 DIAGNOSIS — I5189 Other ill-defined heart diseases: Secondary | ICD-10-CM

## 2017-12-10 NOTE — Patient Instructions (Signed)
Medication Instructions:  Your physician recommends that you continue on your current medications as directed. Please refer to the Current Medication list given to you today.  If you need a refill on your cardiac medications before your next appointment, please call your pharmacy.    Follow-Up: At [facility], you and your health needs are our priority.  As part of our continuing mission to provide you with exceptional heart care, we have created designated Provider Care Teams.  These Care Teams include your primary Cardiologist (physician) and Advanced Practice Providers (APPs -  Physician Assistants and Nurse Practitioners) who all work together to provide you with the care you need, when you need it. You will need a follow up appointment in 12 months.  Please call our office 2 months in advance to schedule this appointment.  You may see Thomas Kelly, MD or one of the following Advanced Practice Providers on your designated Care Team: Hao Meng, PA-C . Angela Duke, PA-C  

## 2017-12-10 NOTE — Progress Notes (Signed)
Cardiology Office Note    Date:  12/11/2017   ID:  Louis [name], DOB 1955/04/02, MRN [id number]  PCP:  Louis Bush, MD  Cardiologist:  Louis Majestic, MD   Cardiology consultation referred through the courtesy of Dr. Ria [name] for evaluation of diastolic dysfunction.  History of Present Illness:  Louis [name] is a 62 y.o. male who is the husband of Dr. Margaretha [name], a retired OB/GYN physician.  I had seen him in 2011.  He presented for cardiology evaluation referred by Dr. Danise [name] following recent hospitalization with complaints of increasing shortness of breath on September 18, 2017.  He presents for a 38-monthfollow-up evaluation..  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  I had seen him in 2011 at which time he had a mild RV conduction delay and was hypertensive on a multiple medical regimen.  An echo Doppler study on February 18, 2009 showed normal LV size and thickness with an EF greater than 55%.  At that time he had normal diastolic parameters.  There was mild mitral regurgitation and trace tricuspid insufficiency.  He had told me at that time that he had a double collecting system to his kidneys.  Renal duplex imaging suggested mild right renal artery narrowing in the 1 to 59% range.  At the time, he was on medical therapy with Bystolic, amlodipine, and direct renin inhibition.    I have not seen him since 2011.  [place] was [place]  He developed significant hypoxia and hypercarbia and required transient BiPAP due to severe oxygen desaturation to 61%.  He was not found to have a pulmonary embolism.  A CT angiogram showed severe scoliosis and suggestion of pulmonary hypertension.  He was felt to have prominence of the main pulmonary outflow track and lower lobe atelectatic changes.  There was evidence for hepatic steatosis as well as aortic atherosclerosis.  An  echo Doppler study revealed normal systolic function with grade 2 diastolic dysfunction, decreased RV function with normal PA pressures.  He subsequently underwent a sleep study and was found to have severe sleep apnea with an AHI of 39/h for which CPAP was implemented with supplemental O2.  He is followed by Dr. AElsworth Sohofrom a pulmonary standpoint.  Pulmonary function studies in August 2019 showed a postbronchodilator ratio at 71 with an FEV1 of 61% with FVC of 65% without significant buccal dilator response.  TLC was 90% with DLCO 96% suggestive of extraparenchymal restriction.  When I saw him for reevaluation in September 2019 he was undergoing pulmonary rehabilitation with some benefit.  He was on amlodipine 2.5 mg, furosemide 20 mg, losartan 100 mg, Toprol-XL 150 mg daily for his blood pressure.  He continued to experience some shortness of breath.  He was unaware of palpitations, and denied presyncope or syncope.  There is no history of tobacco use.  During that evaluation, since he had continue to experience some shortness of breath but had grade 2 diastolic dysfunction without significant edema I suggested discontinuance of furosemide and in its place initiated spironolactone.  A follow-up BMP 2 weeks later was excellent at 33.  Over the past several months, he is breathing much better.  He continues to use CPAP with 100% compliance and has 2 L of oxygen bled into his CPAP unit.  He completed pulmonary rehab and noticed much improvement in his shortness of breath.  He is  now exercising 5 days/week.  He has adjusted his diet.  He denies chest pain PND orthopnea.  He will be seeing [name] in follow-up.  He presents for cardiology reevaluation.  Past Medical History:  Diagnosis Date  . BPH (benign prostatic hyperplasia)    on flomax  . Bundle branch block    per prior PCP records  . Diverticulosis    by colonoscopy  . Eczema    per prior PCP records  . Elevated PSA 2015   peaked 6s, s/p benign  biopsy 2014, sees urology Louis [name] ([place])  . Essential tremor   . GERD (gastroesophageal reflux disease)    per prior PCP records  . History of panic attacks    per prior PCP records  . HTN (hypertension)   . Mild intermittent asthma in adult without complication   . Obesity, Class I, BMI 30-34.9   . Osteoporosis    DEXA 06/2013 with osteopenia - h/o ?wrist/hip fracture from AVN from chronic steroid use (asthma), took reclast for 1 year  . Renal artery stenosis in 1 of 2 vessels ([place]) 2011   by [place]  . Seasonal allergies   . Thoracic scoliosis childhood  . Transaminitis    per prior PCP records    Past Surgical History:  Procedure Laterality Date  . COLONOSCOPY  12/2013   polyps, int hem, diverticulosis, rpt 5 yrs (Louis [name])  . ELBOW SURGERY Right 2008   golfer's elbow  . INGUINAL HERNIA REPAIR Bilateral childhood & ~1995  . NASAL SEPTUM SURGERY  2013   deviated septum  . US ECHOCARDIOGRAPHY  02/2009   WNL, EF >55% Louis [name])  . US RENAL/AORTA Right 05/2009   1-59% diameter reduction renal artery, rec rpt 2 yrs    Current Medications: Outpatient Medications Prior to Visit  Medication Sig Dispense Refill  . ALPRAZolam (XANAX) 0.5 MG tablet Take 1 tablet (0.5 mg total) by mouth at bedtime as needed for sleep. 30 tablet 0  . amLODipine (NORVASC) 5 MG tablet Take 2.5 mg by mouth daily. Pt takes 1/2 tablet daily    . budesonide-formoterol (SYMBICORT) 160-4.5 MCG/ACT inhaler Inhale 2 puffs into the lungs 2 (two) times daily. 1 Inhaler 5  . losartan (COZAAR) 100 MG tablet Take 1 tablet (100 mg total) by mouth daily. 90 tablet 0  . metoprolol succinate (TOPROL-XL) 50 MG 24 hr tablet TAKE 3 TABLETS BY MOUTH DAILY 270 tablet 0  . sildenafil (REVATIO) 20 MG tablet Take 20 mg by mouth 3 (three) times daily as needed.    [name] spironolactone (ALDACTONE) 25 MG tablet Take 1 tablet (25 mg total) by mouth daily. 90 tablet 3  . urea (CARMOL) 40 % CREA apply to affected area twice a day for THICKEST  AREAS if needed for dry skin  0  . Vitamin D, Ergocalciferol, (DRISDOL) 50000 UNITS CAPS capsule Take 50,000 Units by mouth every 7 (seven) days.     No facility-administered medications prior to visit.      Allergies:   Accupril [quinapril hcl]   Social History   Socioeconomic History  . Marital status: Married    Spouse name: Not on file  . Number of children: Not on file  . Years of education: Not on file  . Highest education level: Not on file  Occupational History  . Not on file  Social Needs  . Financial resource strain: Not on file  . Food insecurity:    Worry: Not on file    Inability: Not on  file  . Transportation needs:    Medical: Not on file    Non-medical: Not on file  Tobacco Use  . Smoking status: Never Smoker  . Smokeless tobacco: Never Used  Substance and Sexual Activity  . Alcohol use: Yes    Alcohol/week: 0.0 standard drinks    Comment: Social  . Drug use: No  . Sexual activity: Yes    Birth control/protection: None  Lifestyle  . Physical activity:    Days per week: Not on file    Minutes per session: Not on file  . Stress: Not on file  Relationships  . Social connections:    Talks on phone: Not on file    Gets together: Not on file    Attends religious service: Not on file    Active member of club or organization: Not on file    Attends meetings of clubs or organizations: Not on file    Relationship status: Not on file  Other Topics Concern  . 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  He is a Theme park manager.  There is no tobacco history.  He has 2 children Augus who is 78 and lives in [place] and Carpenter age 72 who has a Oceanographer in Estate manager/land agent.  Family History:  The patient's  family history includes Alcohol abuse in his mother; Ataxia in his brother and sister; Cancer in his maternal grandmother; Cancer (age of onset: 7) in his father; Diabetes in his mother; Prostate cancer in his father; Stroke in his mother.  His father had colon cancer, paternal grandmother had pancreatic cancer.  His father's mother suffered a heart attack in her 47s.  2 of his siblings have cerebellar ataxia.  ROS General: Negative; No fevers, chills, or night sweats;  HEENT: Negative; No changes in vision or hearing, sinus congestion, difficulty swallowing Pulmonary: Positive for recent hospitalization with hypoxia and hypercarbic respiratory failure. Cardiovascular: Negative; No chest pain, presyncope, syncope, palpitations GI: Negative; No nausea, vomiting, diarrhea, or abdominal pain GU: Negative; No dysuria, hematuria, or difficulty voiding Musculoskeletal: Severe scoliosis.  History of osteoporosis Hematologic/Oncology: Negative; no easy bruising, bleeding Endocrine: Negative; no heat/cold intolerance; no diabetes Neuro: Negative; no changes in balance, headaches Skin: Negative; No rashes or skin lesions Psychiatric: Negative; No behavioral problems, depression Sleep: Positive for recently diagnosed severe obstructive sleep apnea, currently on CPAP at 9 with 2 L supplemental oxygen; no bruxism, restless legs, hypnogognic hallucinations, no cataplexy Other comprehensive 14 point system review is negative.   PHYSICAL EXAM:   VS:  BP 118/68   Pulse 72   Ht '5\' 11"'$  (1.803 m)   Wt 223 lb 12.8 oz (101.5 kg)   BMI 31.21 kg/m     Repeat blood pressure by me 118/70  Wt Readings from Last 3 Encounters:  12/10/17 223 lb 12.8 oz (101.5 kg)  11/12/17 220 lb 14.4 oz (100.2 kg)  10/29/17 220 lb 10.9 oz (100.1 kg)    General: Alert, oriented, no distress.  Skin: normal turgor, no rashes, warm and dry HEENT: Normocephalic, atraumatic. Pupils equal round and reactive to light; sclera  anicteric; extraocular muscles intact;  Nose without nasal septal hypertrophy Mouth/Parynx benign; Mallinpatti scale 3  Neck: No JVD, no carotid bruits; normal carotid upstroke Lungs: clear to ausculatation and percussion; no wheezing or rales Chest wall: without tenderness to palpitation Heart: PMI not displaced, RRR, s1 s2 normal, 1/6 systolic murmur, no diastolic murmur, no rubs, gallops, thrills, or heaves Abdomen: soft, nontender; no hepatosplenomehaly, BS+; abdominal aorta nontender and not dilated by palpation. Back: no CVA tenderness Pulses 2+ Musculoskeletal: full range of motion, normal strength, no joint deformities Extremities: no clubbing cyanosis or edema, Homan's sign negative  Neurologic: grossly nonfocal; Cranial nerves grossly wnl Psychologic: Normal mood and affect   Studies/Labs Reviewed:   EKG:  EKG is ordered today.  ECG (independently read by me): Normal sinus rhythm at 72 bpm.  No ectopy.  Normal intervals.  September 18, 2017 ECG (independently read by me): Normal sinus rhythm at 68 bpm.  Borderline LA enlargement.  Nonspecific T changes.  Normal intervals.  No ectopy.  Recent Labs: BMP Latest Ref Rng & Units 11/07/2017 07/08/2017 07/04/2017  Glucose 65 - 99 mg/dL 122(H) 108(H) 105(H)  BUN 8 - 27 mg/dL _0 Creatinine 0.76 - 1.27 mg/dL 0.93 0.78 0.86  BUN/Creat Ratio 10 - 24 15 - -  Sodium 134 - 144 mmol/L 138 136 136  Potassium 3.5 - 5.2 mmol/L 5.0 4.5 4.1  Chloride 96 - 106 mmol/L 96 89(L) 92(L)  CO2 20 - 29 mmol/L 23 41(H) 33(H)  Calcium 8.6 - 10.2 mg/dL 9.9 9.4 8.9     Hepatic Function Latest Ref Rng & Units 07/02/2017 07/30/2016 09/26/2015  Total Protein 6.5 - 8.1 g/dL 7.5 7.6 8.0  Albumin 3.5 - 5.0 g/dL 3.9 4.6 4.5  AST 15 - 41 U/L 22 61(H) 36  ALT 0 - 44 U/L 38 59(H) 36  Alk Phosphatase 38 - 126 U/L 82 70 66  Total Bilirubin 0.3 - 1.2 mg/dL 0.7 0.9 0.8    CBC Latest Ref Rng & Units 07/04/2017 07/03/2017 07/02/2017  WBC 4.0 - 10.5 K/uL 7.2 5.5 6.5    Hemoglobin 13.0 - 17.0 g/dL 16.5 16.2 17.3(H)  Hematocrit 39.0 - 52.0 % 52.6(H) 52.1(H) 54.8(H)  Platelets 150 - 400 K/uL 237 232 222   Lab Results  Component Value Date   MCV 104.8 (H) 07/04/2017   MCV 106.8 (H) 07/03/2017   MCV 105.0 (H) 07/02/2017   Lab Results  Component Value Date   TSH 0.940 07/02/2017   No results found for: HGBA1C   BNP    Component Value Date/Time   BNP 33.0 11/07/2017 0953   BNP 232.6 (H) 07/02/2017 1648    ProBNP No results found for: PROBNP   Lipid Panel     Component Value Date/Time   CHOL 214 (H) 09/26/2015 1328   CHOL 204 10/11/2014   TRIG 60.0 09/26/2015 1328   TRIG 51 10/11/2014   HDL 100.60 09/26/2015 1328   HDL 75 10/11/2014   CHOLHDL 2 09/26/2015 1328   VLDL 12.0 09/26/2015 1328   LDLCALC 101 (H) 09/26/2015 1328   LDLCALC 119 10/11/2014     RADIOLOGY: No results found.   Additional studies/ records that were reviewed today include:  ------------------------------------------------------------------- 07/03/2017 ECHO Study Conclusions  - Left ventricle: The cavity size was normal. There was mild   concentric hypertrophy. Systolic function was normal. The   estimated ejection fraction was in the range of 60% to 65%. Wall   motion was normal; there were no regional wall motion   abnormalities. Features are consistent with a pseudonormal left   ventricular filling  pattern, with concomitant abnormal relaxation   and increased filling pressure (grade 2 diastolic dysfunction).   There was no evidence of elevated ventricular filling pressure by   Doppler parameters. - Aortic valve: There was no regurgitation. - Mitral valve: There was no regurgitation. - Left atrium: The atrium was moderately dilated. - Right ventricle: The cavity size was moderately dilated. Wall   thickness was normal. Systolic function was mildly reduced. - Right atrium: The atrium was normal in size. - Pulmonic valve: There was trivial  regurgitation. - Inferior vena cava: The vessel was normal in size. - Pericardium, extracardiac: A trivial pericardial effusion was   identified posterior to the heart. Features were not consistent   with tamponade physiology.   07/02/2017  CT Angio Chest  MPRESSION: 1. No demonstrable pulmonary embolus. No thoracic aortic aneurysm or dissection. There is aortic and great vessel atherosclerosis.  2. Prominence of the main pulmonary outflow tract, a finding felt to be indicative of pulmonary arterial hypertension.  3. Lower lobe atelectatic change. Suspect early pneumonia in each posterior lung base.  4.  No evident thoracic adenopathy.  5.  Hepatic steatosis.  Aortic Atherosclerosis (ICD10-I70.0).   ASSESSMENT:    1. Essential hypertension   2. Chronic heart failure with preserved ejection fraction ([place])   3. Grade II diastolic dysfunction   4. 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  In 2011 an echo Doppler study showed normal systolic and diastolic function.  He was hospitalized following a trip to [place] with hypoxia and  respiratory failure requiring transient BiPAP therapy due to severe oxygen desaturation.  He was diuresed with Lasix.  BNP was 233.  He subsequently underwent pulmonary function studies as well as a sleep evaluation and has severe sleep apnea currently on CPAP therapy with supplemental oxygen.  His most recent echo Doppler study July 2019 showed  normal systolic function but now reveals grade 2 diastolic dysfunction.  His left atrium was moderately dilated as was his right ventricle with suggestion of possible mild RV systolic function reduction.  He had trivial pulmonic insufficiency.  A trivial pericardial effusion was identified not felt to be significant.  When I saw him for reestablishment of care in September 2019, he did not  have edema and in light of his grade 2 diastolic dysfunction I initiated Spironolactone in place of furosemide.  He feels significantly improved with this therapy.  A subsequent BNP level several weeks later was excellent at 33.  He has improved aerobic capacity and endurance since participating in pulmonary rehab and objectively notes significant benefit in his previous shortness of breath.  He is now exercising 5 days/week.  He continues to use CPAP with 100% compliance and now has 2 L of oxygen bled into his CPAP machine.  Weight today is 223 which is fairly stable from his last evaluation.  He is not having any chest pain or anginal symptoms.  He is not having any palpitations or rhythm disturbance.  I reviewed recent laboratory.  He will continue his current regimen of amlodipine 2.5 mg daily, losartan 100 mg daily, Toprol-XL 150 mg daily and spironolactone which had been titrated up to 25 mg daily.  He is on Symbicort and there is no wheezing.  He will be seeing [name] in follow-up.  I will see him in 1 year for  reevaluation or sooner if problems arise.   Medication Adjustments/Labs and Tests Ordered: Current medicines are reviewed at length with the patient today.  Concerns regarding medicines are outlined above.  Medication changes, Labs and Tests ordered today are listed in the Patient Instructions below. Patient Instructions  Medication Instructions:  Your physician recommends that you continue on your current medications as directed. Please refer to the Current Medication list given to you today.  If you need a refill on your cardiac medications before your next appointment, please call your pharmacy.   Follow-Up: At [facility], you and your health needs are our priority.  As part of our continuing mission to provide you with exceptional heart care, we have created designated Provider Care Teams.  These Care Teams include your primary Cardiologist (physician) and Advanced Practice  Providers (APPs -  Physician Assistants and Nurse Practitioners) who all work together to provide you with the care you need, when you need it. You will need a follow up appointment in 12 months.  Please call our office 2 months in advance to schedule this appointment.  You may see Louis Majestic, MD or one of the following Advanced Practice Providers on your designated Care Team: [place], [place] . Fabian Sharp, PA-C     Signed, Louis Majestic, MD  12/11/2017 10:13 PM    [facility] [address] [place], [place], [place]  [address] Phone: [phone]

## 2017-12-11 ENCOUNTER — Encounter: Payer: Self-pay | Admitting: Cardiovascular Disease

## 2017-12-19 ENCOUNTER — Encounter: Payer: Self-pay | Admitting: Pulmonary Disease

## 2017-12-19 ENCOUNTER — Ambulatory Visit (INDEPENDENT_AMBULATORY_CARE_PROVIDER_SITE_OTHER): Payer: 59 | Admitting: Pulmonary Disease

## 2017-12-19 DIAGNOSIS — J4520 Mild intermittent asthma, uncomplicated: Secondary | ICD-10-CM

## 2017-12-19 DIAGNOSIS — J9611 Chronic respiratory failure with hypoxia: Secondary | ICD-10-CM

## 2017-12-19 DIAGNOSIS — G4733 Obstructive sleep apnea (adult) (pediatric): Secondary | ICD-10-CM | POA: Diagnosis not present

## 2017-12-19 DIAGNOSIS — J9612 Chronic respiratory failure with hypercapnia: Secondary | ICD-10-CM

## 2017-12-19 NOTE — Patient Instructions (Signed)
Blood work for lipid profile and BMET on empty stomach  Increase CPAP to 11 cm  Stay on Symbicort twice daily. Keep up with your workouts.  Prior to next appointment, call us 2 weeks prior and we can set up nocturnal oxygen test

## 2017-12-19 NOTE — Assessment & Plan Note (Signed)
Stay on Symbicort twice daily. Keep up with your workouts.

## 2017-12-19 NOTE — Assessment & Plan Note (Signed)
Blood work for lipid profile and BMET on empty stomach  Increase CPAP to 11 cm Few residual events persist on CPAP 10 cm

## 2017-12-19 NOTE — Addendum Note (Signed)
Addended by: Valerie Salts on: 12/19/2017 12:21 PM   Modules accepted: Orders

## 2017-12-19 NOTE — Assessment & Plan Note (Signed)
Ct CPAP/ 1 L O2  Prior to next appointment, call us 2 weeks prior and we can set up nocturnal oxygen test

## 2017-12-19 NOTE — Progress Notes (Signed)
   Subjective:    [name], male    DOB: [birth year], 62 y.o.   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 He is compliant with CPAP and this is objectively confirmed on download which shows excellent compliance about 6 hours every night, residual AHI of 6.6/hour and no leak  He admits to forgetting to use his Symbicort occasionally.  Graduated from pulmonary rehab and now works out with a Clinical research associate.   Labs noted which show rising potassium, now on spironolactone and losartan  Significant tests/ events reviewed  CT angiogram 07/2017>>severe scoliosis,suggestion of pulmonary hypertension. Echo 09/7414 grade 2 diastolic dysfunction, normal LV function, decreased RV function but normal PA pressures EKG-right axis deviation  NPSG AHI 39/h, >> CPAP 9 cm + 2L o2  PFTs 08/2017>>postbronchodilator ratio 71, FEV1 61% with FVC 65%, no significant broncho dilator response, TLC 90%, DLCO 96% suggestive extraparenchymal restriction  Past Medical History:  Diagnosis Date  . BPH (benign prostatic hyperplasia)    on flomax  . Bundle branch block    per prior PCP records  . Diverticulosis    by colonoscopy  . Eczema    per prior PCP records  . Elevated PSA 2015   peaked 6s, s/p benign biopsy 2014, sees urology [name] ([place])  . Essential tremor   . GERD (gastroesophageal reflux disease)    per prior PCP records  . History of panic attacks    per prior PCP records  . HTN (hypertension)   . Mild intermittent asthma in adult without complication   .  Obesity, Class I, BMI 30-34.9   . Osteoporosis    DEXA 06/2013 with osteopenia - h/o ?wrist/hip fracture from AVN from chronic steroid use (asthma), took reclast for 1 year  . Renal artery stenosis in 1 of 2 vessels ([place]) 2011   by [place]  . Seasonal allergies   . Thoracic scoliosis childhood  . Transaminitis    per prior PCP records      Review of Systems neg for any significant sore throat, dysphagia, itching, sneezing, nasal congestion or excess/ purulent secretions, fever, chills, sweats, unintended wt loss, pleuritic or exertional cp, hempoptysis, orthopnea pnd or change in chronic leg swelling. Also denies presyncope, palpitations, heartburn, abdominal pain, nausea, vomiting, diarrhea or change in bowel or urinary habits, dysuria,hematuria, rash, arthralgias, visual complaints, headache, numbness weakness or ataxia.     Objective:   Physical Exam   [place]. [place], obese, in no distress, normal affect ENT - no pallor,icterus, no post nasal drip, class 2-3 airway Neck: No JVD, no thyromegaly, no carotid bruits Lungs: no use of accessory muscles, no dullness to percussion, decreased without rales or rhonchi  Cardiovascular: Rhythm regular, heart sounds  normal, no murmurs or gallops, no peripheral edema Abdomen: soft and non-tender, no hepatosplenomegaly, BS normal. Musculoskeletal: No deformities, no cyanosis or clubbing Neuro:  alert, non focal, no tremors        Assessment & Plan:

## 2017-12-26 NOTE — Addendum Note (Signed)
Encounter addended by: Rowe Pavy, RN on: 12/26/2017 4:37 PM  Actions taken: Episode resolved, Flowsheet data copied forward, Clinical Note Signed, Visit Navigator Flowsheet section accepted

## 2017-12-26 NOTE — Progress Notes (Signed)
Discharge Progress Report  Patient Details  Name: Louis [name] MRN: [id number] Date of Birth: 02-Dec-1955 Referring Provider:     Pulmonary Rehab Walk Test from 08/22/2017 in [place]  Referring Provider  [name]       Number of Visits: 24  Reason for Discharge:  Patient reached a stable level of exercise. Patient independent in their exercise. Patient has met program and personal goals.  Smoking History:  Social History   Tobacco Use  Smoking Status Never Smoker  Smokeless Tobacco Never Used    Diagnosis:  Acute respiratory failure with hypoxia and hypercapnia ([place])  ADL [facility]: Pulmonary Assessment Scores    Row Name 08/26/17 1018 12/02/17 1042 12/02/17 1044     ADL [facility]   ADL Phase  Entry  Exit  -   SOB Score total  -  6  -     CAT Score   CAT Score  -  did not return CAT document post  pre 2     mMRC Score   mMRC Score  0  -  -   Row Name 12/26/17 1632         ADL [facility]   ADL Phase  Entry        Initial Exercise Prescription: Initial Exercise Prescription - 08/26/17 1000      Date of Initial Exercise RX and Referring Provider   Date  08/26/17    Referring Provider  [name]      Recumbant Bike   Level  1    Watts  10    Minutes  17      NuStep   Level  2    SPM  80    Minutes  17    METs  1.5      Track   Laps  6    Minutes  17      Prescription Details   Frequency (times per week)  2    Duration  Progress to 45 minutes of aerobic exercise without signs/symptoms of physical distress      Intensity   THRR 40-80% of Max Heartrate  64-128    Ratings of Perceived Exertion  11-13    Perceived Dyspnea  0-4      Progression   Progression  Continue progressive overload as per policy without signs/symptoms or physical distress.      Resistance Training   Training Prescription  Yes    Weight  blue bands    Reps  10-15       Discharge Exercise Prescription (Final Exercise Prescription  Changes): Exercise Prescription Changes - 11/12/17 1600      Response to Exercise   Blood Pressure (Admit)  120/62    Blood Pressure (Exercise)  122/62    Blood Pressure (Exit)  100/56    Heart Rate (Admit)  96 bpm    Heart Rate (Exercise)  108 bpm    Heart Rate (Exit)  83 bpm    Oxygen Saturation (Admit)  96 %    Oxygen Saturation (Exercise)  93 %    Oxygen Saturation (Exit)  95 %    Rating of Perceived Exertion (Exercise)  13    Perceived Dyspnea (Exercise)  3    Duration  Continue with 45 min of aerobic exercise without signs/symptoms of physical distress.    Intensity  THRR unchanged      Progression   Progression  Continue to progress workloads to maintain intensity without  signs/symptoms of physical distress.      Resistance Training   Training Prescription  Yes    Weight  blue bands    Reps  10-15    Time  10 Minutes      Interval Training   Interval Training  Yes    Equipment  Bike;NuStep      Treadmill   MPH  3.1    Grade  4    Minutes  17      Bike   Level  1.3    Minutes  17      NuStep   Level  7    SPM  80    Minutes  17    METs  2.7       Functional Capacity: 6 Minute Walk    Row Name 08/26/17 1018 11/26/17 1556       6 Minute Walk   Phase  Initial  Discharge    Distance  1200 feet  1880 feet    Distance Feet Change  -  680 ft    Walk Time  6 minutes  6 minutes    # of Rest Breaks  0  0    MPH  2.27  3.56    METS  2.68  3.76    RPE  12  12    Perceived Dyspnea   2  1    Symptoms  No  No    Resting HR  75 bpm  79 bpm    Resting BP  144/88  130/72    Resting Oxygen Saturation   93 %  96 %    Exercise Oxygen Saturation  during 6 min walk  88 %  88 %    Max Ex. HR  124 bpm  120 bpm    Max Ex. BP  170/80  156/80    2 Minute Post BP  142/84  126/82      Interval HR   1 Minute HR  77  120    2 Minute HR  76  119    3 Minute HR  76  116    4 Minute HR  76  112    5 Minute HR  124  112    6 Minute HR  112  112    2 Minute Post HR  84   84    Interval Heart Rate?  Yes  Yes      Interval Oxygen   Interval Oxygen?  Yes  Yes    Baseline Oxygen Saturation %  93 %  96 %    1 Minute Oxygen Saturation %  92 %  92 %    1 Minute Liters of Oxygen  0 L  0 L    2 Minute Oxygen Saturation %  88 %  90 %    2 Minute Liters of Oxygen  0 L  0 L    3 Minute Oxygen Saturation %  88 %  90 %    3 Minute Liters of Oxygen  0 L  0 L    4 Minute Oxygen Saturation %  88 %  89 %    4 Minute Liters of Oxygen  0 L  0 L    5 Minute Oxygen Saturation %  88 %  88 %    5 Minute Liters of Oxygen  0 L  0 L    6 Minute Oxygen Saturation %  89 %  88 %  6 Minute Liters of Oxygen  0 L  0 L    2 Minute Post Oxygen Saturation %  94 %  97 %    2 Minute Post Liters of Oxygen  0 L  0 L       Psychological, QOL, Others - Outcomes: PHQ 2/9: Depression screen [facility] 2/9 11/26/2017 08/14/2017  Decreased Interest 0 0  Down, Depressed, Hopeless 0 0  PHQ - 2 Score 0 0  Altered sleeping 0 0  Tired, decreased energy 0 0  Change in appetite 0 0  Feeling bad or failure about yourself  0 0  Trouble concentrating 0 0  Moving slowly or fidgety/restless 0 0  Suicidal thoughts 0 0  PHQ-9 Score 0 0  Difficult doing work/chores Not difficult at all Not difficult at all    Quality of Life:   Personal Goals: Goals established at orientation with interventions provided to work toward goal. Personal Goals and Risk Factors at Admission - 08/14/17 1029      Core Components/Risk Factors/Patient Goals on Admission    Weight Management  Weight Loss    Admit Weight  225 lb 15.5 oz (102.5 kg)    Goal Weight: Short Term  215 lb (97.5 kg)    Goal Weight: Long Term  200 lb (90.7 kg)    Expected Outcomes  Short Term: Continue to assess and modify interventions until short term weight is achieved;Weight Loss: Understanding of general recommendations for a balanced deficit meal plan, which promotes 1-2 lb weight loss per week and includes a negative energy balance of [phone]  kcal/d;Understanding of distribution of calorie intake throughout the day with the consumption of 4-5 meals/snacks;Understanding recommendations for meals to include 15-35% energy as protein, 25-35% energy from fat, 35-60% energy from carbohydrates, less than 2102m of dietary cholesterol, 20-35 gm of total fiber daily    Improve shortness of breath with ADL's  Yes    Intervention  Provide education, individualized exercise plan and daily activity instruction to help decrease symptoms of SOB with activities of daily living.    Expected Outcomes  Short Term: Improve cardiorespiratory fitness to achieve a reduction of symptoms when performing ADLs;Long Term: Be able to perform more ADLs without symptoms or delay the onset of symptoms    Heart Failure  Yes    Intervention  Provide a combined exercise and nutrition program that is supplemented with education, support and counseling about heart failure. Directed toward relieving symptoms such as shortness of breath, decreased exercise tolerance, and extremity edema.    Expected Outcomes  Improve functional capacity of life;Short term: Attendance in program 2-3 days a week with increased exercise capacity. Reported lower sodium intake. Reported increased fruit and vegetable intake. Reports medication compliance.;Short term: Daily weights obtained and reported for increase. Utilizing diuretic protocols set by physician.;Long term: Adoption of self-care skills and reduction of barriers for early signs and symptoms recognition and intervention leading to self-care maintenance.    Hypertension  Yes    Intervention  Provide education on lifestyle modifcations including regular physical activity/exercise, weight management, moderate sodium restriction and increased consumption of fresh fruit, vegetables, and low fat dairy, alcohol moderation, and smoking cessation.;Monitor prescription use compliance.    Expected Outcomes  Short Term: Continued assessment and  intervention until BP is < 140/923mHG in hypertensive participants. < 130/8044mG in hypertensive participants with diabetes, heart failure or chronic kidney disease.;Long Term: Maintenance of blood pressure at goal levels.        Personal  Goals Discharge: Goals and Risk Factor Review    Row Name 09/19/17 1704 10/17/17 1102 11/13/17 1311 11/26/17 1410       Core Components/Risk Factors/Patient Goals Review   Personal Goals Review  Weight Management/Obesity;Heart Failure;Develop more efficient breathing techniques such as purse lipped breathing and diaphragmatic breathing and practicing self-pacing with activity.;Increase knowledge of respiratory medications and ability to use respiratory devices properly.;Hypertension;Improve shortness of breath with ADL's  Weight Management/Obesity;Heart Failure;Develop more efficient breathing techniques such as purse lipped breathing and diaphragmatic breathing and practicing self-pacing with activity.;Increase knowledge of respiratory medications and ability to use respiratory devices properly.;Hypertension;Improve shortness of breath with ADL's  Weight Management/Obesity;Heart Failure;Develop more efficient breathing techniques such as purse lipped breathing and diaphragmatic breathing and practicing self-pacing with activity.;Increase knowledge of respiratory medications and ability to use respiratory devices properly.;Hypertension;Improve shortness of breath with ADL's  -    Review  Pt is off to a great start with the completion of 6 exercise sessions.  Pt with weight loss of 3.3 kg,  Pt reports watching his diet, weighing daily and medication compliance.  Pt dypnea level ranges 0-1.  Pt workloads with averaging 24 laps around the track, recumbent bike increase to level 4 and nustep increase to level 5.  I anticipate pt will continue to show progress toward goals in the next 30 day assessment  Pt has  completed of 12 exercise sessions and 5 education classes.  Pt  with weight loss of 3.3 kg,  Pt reports watching his diet, weighing daily and medication compliance. Pt recently established care with cardiologist for management of Heart Failure.  Medications were tweaked. Pt dypnea level ranges 1-2 Pt demonstrates PLB techniques independently.   Pt is engaging in HIIT and tolerates this well with bp and O2 saturation. Pt is on the treadmill 3.1/4, airdyne 1.5  and nustep increase to level 5.  I anticipate with the insertion of HIIT pt will continue to show progress toward goals in the next 30 day assessment  Pt has  completed of 19 exercise sessions and 7education classes.  Pt with weight loss of 13.4 kg,  Pt reports watching his sodium intake, weighing daily and medication compliance. Pt recently established care with cardiologist for management of Heart Failure.  Medications were tweaked. Pt dypnea level ranges 1-2 Pt demonstrates PLB techniques independently.   Pt is engaging in HIIT and tolerates this well with bp and O2 saturation. Pt is on the treadmill 3.1/4, airdyne and nustep increase to level 7  I anticipate with the insertion of HIIT pt will continue to show progress toward goals in the next 30 day assessment  -    Expected Outcomes  See Admission Goals/Outcomes  See Admission Goals/Outcomes  See Admission Goals/Outcomes  Pt graduates with the completion of 24 exercise sessions.  Pt will continue exercise with personal trainer at [employer] on  tuesday wednesday, thursday, friday and Saturday.  Aerobic and weights along with Pilates.       Exercise Goals and Review: Exercise Goals    Row Name 08/14/17 1023             Exercise Goals   Increase Physical Activity  Yes       Intervention  Provide advice, education, support and counseling about physical activity/exercise needs.;Develop an individualized exercise prescription for aerobic and resistive training based on initial evaluation findings, risk stratification, comorbidities and participant's personal goals.        Expected Outcomes  Short Term: Attend rehab on a  regular basis to increase amount of physical activity.;Long Term: Exercising regularly at least 3-5 days a week.;Long Term: Add in home exercise to make exercise part of routine and to increase amount of physical activity.       Increase Strength and Stamina  Yes       Intervention  Provide advice, education, support and counseling about physical activity/exercise needs.;Develop an individualized exercise prescription for aerobic and resistive training based on initial evaluation findings, risk stratification, comorbidities and participant's personal goals.       Expected Outcomes  Short Term: Increase workloads from initial exercise prescription for resistance, speed, and METs.;Short Term: Perform resistance training exercises routinely during rehab and add in resistance training at home;Long Term: Improve cardiorespiratory fitness, muscular endurance and strength as measured by increased METs and functional capacity (6MWT)       Able to understand and use rate of perceived exertion (RPE) scale  Yes       Intervention  Provide education and explanation on how to use RPE scale       Expected Outcomes  Short Term: Able to use RPE daily in rehab to express subjective intensity level;Long Term:  Able to use RPE to guide intensity level when exercising independently       Able to understand and use Dyspnea scale  Yes       Intervention  Provide education and explanation on how to use Dyspnea scale       Expected Outcomes  Short Term: Able to use Dyspnea scale daily in rehab to express subjective sense of shortness of breath during exertion;Long Term: Able to use Dyspnea scale to guide intensity level when exercising independently       Knowledge and understanding of Target Heart Rate Range (THRR)  Yes       Intervention  Provide education and explanation of THRR including how the numbers were predicted and where they are located for reference        Expected Outcomes  Short Term: Able to state/look up THRR;Long Term: Able to use THRR to govern intensity when exercising independently;Short Term: Able to use daily as guideline for intensity in rehab       Understanding of Exercise Prescription  Yes       Intervention  Provide education, explanation, and written materials on patient's individual exercise prescription       Expected Outcomes  Short Term: Able to explain program exercise prescription;Long Term: Able to explain home exercise prescription to exercise independently          Exercise Goals Re-Evaluation: Exercise Goals Re-Evaluation    Row Name 09/17/17 0917 10/17/17 0732 11/13/17 0901         Exercise Goal Re-Evaluation   Exercise Goals Review  Understanding of Exercise Prescription;Knowledge and understanding of Target Heart Rate Range (THRR);Able to understand and use Dyspnea scale;Able to understand and use rate of perceived exertion (RPE) scale;Increase Strength and Stamina;Increase Physical Activity  Understanding of Exercise Prescription;Knowledge and understanding of Target Heart Rate Range (THRR);Able to understand and use Dyspnea scale;Able to understand and use rate of perceived exertion (RPE) scale;Increase Strength and Stamina;Increase Physical Activity  Understanding of Exercise Prescription;Knowledge and understanding of Target Heart Rate Range (THRR);Able to understand and use Dyspnea scale;Able to understand and use rate of perceived exertion (RPE) scale;Increase Strength and Stamina;Increase Physical Activity     Comments  Patient has only attended 4 rehab sessions. Is progressing well. Is able to walk 16 laps (200 ft) in 15 minutes.  Will discuss home exercise and how to progress intensities at rehab and at home.  Patient is progressing well. He is working on the HIIT protocol. Will encourage home exercise.   Pt is progressing well. He is doing HIIT training on the bike. He is progressing on the stepper and treadmill as  well. Pt is going to the gym twice a week as well for pilates and weight training. Pt is very motivated and plans to continue exercise after graduation.      Expected Outcomes  Through exercise at rehab and at home, patient will increase physical capacity and be able to perform ADL's with ease. They will feel comfortable estblishing an exercise routine at home.   Through exercise at rehab and at home, patient will increase physical capacity and be able to perform ADL's with ease. They will feel comfortable estblishing an exercise routine at home.   Through exercise at rehab and at home, patient will increase physical capacity and be able to perform ADL's with ease. They will feel comfortable estblishing an exercise routine at home.         Nutrition & Weight - Outcomes:    Nutrition: Nutrition Therapy & Goals - 12/06/17 0846      Nutrition Therapy   Diet  heart healthy      Personal Nutrition Goals   Nutrition Goal  identify and limit food sources of sodium, saturated fat, trans fat, refined carbohydrates    Personal Goal #2  identify food quantities necessary to achieve we loss of 6-24 lbs    Personal Goal #3  describe the benefit of including fruits, vegetables, whole grains, low fat dairy in a healthy meal plan      Intervention Plan   Intervention  Prescribe, educate and counsel regarding individualized specific dietary modifications aiming towards targeted core components such as weight, hypertension, lipid management, diabetes, heart failure and other comorbidities.    Expected Outcomes  Short Term Goal: Understand basic principles of dietary content, such as calories, fat, sodium, cholesterol and nutrients.       Nutrition Discharge: Nutrition Assessments - 12/06/17 0846      Rate Your Plate Scores   Pre Score  40    Post Score  29       Education Questionnaire Score: Knowledge Questionnaire Score - 12/02/17 1045      Knowledge Questionnaire Score   Pre Score  16/18     Post Score  17/18       Goals reviewed with patient. Cherre Huger, BSN Cardiac and Training and development officer

## 2018-01-07 ENCOUNTER — Telehealth: Payer: Self-pay | Admitting: Family Medicine

## 2018-01-07 NOTE — Telephone Encounter (Signed)
E-scribed metoprolol.  Please schedule annual CPE.

## 2018-01-08 NOTE — Telephone Encounter (Signed)
Tried calling pt voicemail full 

## 2018-01-13 ENCOUNTER — Other Ambulatory Visit: Payer: Self-pay | Admitting: Family Medicine

## 2018-01-13 NOTE — Telephone Encounter (Signed)
E-scribed refills.  Please schedule annual CPE.

## 2018-01-14 NOTE — Telephone Encounter (Signed)
Tried calling pt voicemail full 

## 2018-01-21 ENCOUNTER — Encounter: Payer: Self-pay | Admitting: Family Medicine

## 2018-01-21 NOTE — Telephone Encounter (Signed)
Pt stated he wish not to sched an appt at this time.

## 2018-01-21 NOTE — Telephone Encounter (Signed)
Noted.  Fyi to Dr. G.  

## 2018-01-21 NOTE — Telephone Encounter (Signed)
Left message asking pt to call office mailed letter °

## 2018-02-04 ENCOUNTER — Other Ambulatory Visit: Payer: Self-pay | Admitting: Pulmonary Disease

## 2018-02-17 ENCOUNTER — Other Ambulatory Visit: Payer: Self-pay

## 2018-02-17 MED ORDER — AMLODIPINE BESYLATE 5 MG PO TABS: 5.0000 mg | ORAL_TABLET | Freq: Every day | ORAL | 2 refills | Status: DC

## 2018-02-17 MED ORDER — METOPROLOL SUCCINATE ER 50 MG PO TB24: 150.0000 mg | ORAL_TABLET | Freq: Every day | ORAL | 2 refills | Status: DC

## 2018-02-17 MED ORDER — LOSARTAN POTASSIUM 100 MG PO TABS: ORAL_TABLET | ORAL | 2 refills | Status: DC

## 2018-02-25 ENCOUNTER — Encounter: Payer: Self-pay | Admitting: Pulmonary Disease

## 2018-02-25 ENCOUNTER — Ambulatory Visit (INDEPENDENT_AMBULATORY_CARE_PROVIDER_SITE_OTHER): Payer: 59 | Admitting: Pulmonary Disease

## 2018-02-25 DIAGNOSIS — J4520 Mild intermittent asthma, uncomplicated: Secondary | ICD-10-CM

## 2018-02-25 DIAGNOSIS — J9611 Chronic respiratory failure with hypoxia: Secondary | ICD-10-CM | POA: Diagnosis not present

## 2018-02-25 DIAGNOSIS — I5189 Other ill-defined heart diseases: Secondary | ICD-10-CM

## 2018-02-25 DIAGNOSIS — J9612 Chronic respiratory failure with hypercapnia: Secondary | ICD-10-CM

## 2018-02-25 DIAGNOSIS — J209 Acute bronchitis, unspecified: Secondary | ICD-10-CM | POA: Diagnosis not present

## 2018-02-25 MED ORDER — DOXYCYCLINE HYCLATE 100 MG PO TABS: 100.0000 mg | ORAL_TABLET | Freq: Two times a day (BID) | ORAL | 0 refills | Status: DC

## 2018-02-25 NOTE — Addendum Note (Signed)
Addended by: Nena Polio on: 02/25/2018 10:32 AM   Modules accepted: Orders

## 2018-02-25 NOTE — Patient Instructions (Addendum)
Doxycycline 100 mg twice daily for 7 days. Call if cough gets worse  Check nocturnal oximetry on CPAP/room air in 2 weeks and based on this we will advise about oxygen use for your trip  Once acute symptoms resolved, okay to decrease Symbicort to once daily until you are done and trial off Call us back to report.  Blood work for [employer]

## 2018-02-25 NOTE — Assessment & Plan Note (Signed)
Doxycycline 100 mg twice daily for 7 days. Call if cough gets worse

## 2018-02-25 NOTE — Assessment & Plan Note (Signed)
Blood work for [employer]

## 2018-02-25 NOTE — Assessment & Plan Note (Signed)
Check nocturnal oximetry on CPAP/room air in 2 weeks and based on this we will advise about oxygen use for your trip

## 2018-02-25 NOTE — Progress Notes (Signed)
   Subjective:    [name], male    DOB: 01-26-1955, 63 y.o.   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 Cough    Productive cough started yesterday.    Wife was sick, tested negative for the flu, had a cough. He then developed a cough starting yesterday, productive with yellow sputum, denies sore throat or fevers or body aches  Compliant with CPAP and this is working well, on his last visit we had increased it to 11 cm and he has few residuals AHI 6/hour excellent compliance, mild leak  They are planning a trip for 8 days and he wonders if he can be without oxygen in April. Also inquires whether he needs albuterol, has been on Symbicort for mild persistent asthma.  Had severe asthma as a child but as an adult this was mostly quiescent  Significant tests/ events reviewed  CT angiogram 07/2017>>severe scoliosis,suggestion of pulmonary hypertension. Echo 06/2950 grade 2 diastolic dysfunction, normal LV function, decreased RV function but normal PA pressures EKG-right axis deviation 07/2017 ABG7.31/72/71 on 4 L oxygen  NPSG AHI 39/h, >> CPAP 9 cm + 2L o2  PFTs 08/2017>>postbronchodilator ratio 71, FEV1 61% with FVC 65%, no significantbronchodilator response, TLC 90%, DLCO 96% suggestive extraparenchymal restriction  Past Medical History:  Diagnosis Date  . BPH (benign prostatic hyperplasia)    on flomax  . Bundle branch block    per prior PCP records  . Diverticulosis    by colonoscopy  . Eczema    per prior PCP records  . Elevated PSA 2015   peaked 6s, s/p benign biopsy 2014, sees urology [name] ([place])  . Essential tremor   . GERD (gastroesophageal reflux disease)    per prior PCP records  . History of panic attacks    per prior PCP records   . HTN (hypertension)   . Mild intermittent asthma in adult without complication   . Obesity, Class I, BMI 30-34.9   . Osteoporosis    DEXA 06/2013 with osteopenia - h/o ?wrist/hip fracture from AVN from chronic steroid use (asthma), took reclast for 1 year  . Renal artery stenosis in 1 of 2 vessels ([place]) 2011   by [place]  . Seasonal allergies   . Thoracic scoliosis childhood  . Transaminitis    per prior PCP records     Review of Systems neg for any significant sore throat, dysphagia, itching, sneezing, nasal congestion or excess/ purulent secretions, fever, chills, sweats, unintended wt loss, pleuritic or exertional cp, hempoptysis, orthopnea pnd or change in chronic leg swelling. Also denies presyncope, palpitations, heartburn, abdominal pain, nausea, vomiting, diarrhea or change in bowel or urinary habits, dysuria,hematuria, rash, arthralgias, visual complaints, headache, numbness weakness or ataxia.     Objective:   Physical Exam   Gen. Pleasant, obese, in no distress ENT - no lesions, no post nasal drip Neck: No JVD, no thyromegaly, no carotid bruits Lungs: scoliosis +,no use of accessory muscles, no dullness to percussion, decreased without rales or rhonchi  Cardiovascular: Rhythm regular, heart sounds  normal, no murmurs or gallops, no peripheral edema Musculoskeletal: No deformities, no cyanosis or clubbing , no tremors        Assessment & Plan:

## 2018-02-25 NOTE — Assessment & Plan Note (Signed)
Once acute symptoms resolved, okay to decrease Symbicort to once daily until you are done and trial off Call us back to report. Use albuterol prn

## 2018-04-07 ENCOUNTER — Other Ambulatory Visit: Payer: Self-pay | Admitting: Family Medicine

## 2018-04-16 ENCOUNTER — Telehealth: Payer: Self-pay | Admitting: *Deleted

## 2018-04-16 NOTE — Telephone Encounter (Signed)
[id number] Carlye Panameno,BSN,RN3,CCM,CN: tct-[facility] for check up on well being,  Has history of respiratory problems/ is staying inside due to covid-19, wife is able to do errands, having no issues.  Discussed several topics and concluded call.

## 2018-05-07 ENCOUNTER — Encounter: Payer: Self-pay | Admitting: Family

## 2018-05-07 ENCOUNTER — Ambulatory Visit (INDEPENDENT_AMBULATORY_CARE_PROVIDER_SITE_OTHER): Payer: 59

## 2018-05-07 ENCOUNTER — Other Ambulatory Visit: Payer: Self-pay

## 2018-05-07 ENCOUNTER — Ambulatory Visit (INDEPENDENT_AMBULATORY_CARE_PROVIDER_SITE_OTHER): Payer: 59 | Admitting: Family

## 2018-05-07 VITALS — Ht 71.0 in | Wt 219.4 lb

## 2018-05-07 DIAGNOSIS — M79672 Pain in left foot: Secondary | ICD-10-CM | POA: Diagnosis not present

## 2018-05-07 DIAGNOSIS — S93325A Dislocation of tarsometatarsal joint of left foot, initial encounter: Secondary | ICD-10-CM

## 2018-05-07 MED ORDER — OXYCODONE-ACETAMINOPHEN 5-325 MG PO TABS: 1.0000 | ORAL_TABLET | ORAL | 0 refills | Status: DC | PRN

## 2018-05-08 ENCOUNTER — Encounter (HOSPITAL_COMMUNITY): Payer: Self-pay | Admitting: *Deleted

## 2018-05-08 ENCOUNTER — Ambulatory Visit (INDEPENDENT_AMBULATORY_CARE_PROVIDER_SITE_OTHER): Payer: Self-pay | Admitting: Physician Assistant

## 2018-05-08 ENCOUNTER — Other Ambulatory Visit: Payer: Self-pay

## 2018-05-08 NOTE — Progress Notes (Signed)
Louis [name] denies chest pain or shortness of breath. Patient has a CPAP and he will bring mask in with in- he reports that he is spending 1 night. Louis [name] denies that he nor his family has experienced any of the following: Cough Fever >100.4 Runny Nose Sore Throat Difficulty breathing/ shortness of breath Travel in past 14 days I instructed patient to stop eating at 12 midnight.  I instructed patient t drink clear liquids until 0430.  We discussed what clear liquids consist of. I instructed patient to drink his Pre- Surgery Drink  between 0400- 0430, then nothing to drink after 0430. Louis [name] is going to call his surgeron and ask if he wants him to drink the Pre- Surgery drink and if he does , patient willcall me and I will take a Pre- Surgery drink to the [facility] to be picked up.

## 2018-05-09 ENCOUNTER — Observation Stay (HOSPITAL_COMMUNITY)
Admission: RE | Admit: 2018-05-09 | Discharge: 2018-05-10 | Disposition: A | Discharge status: Home / Self Care | Payer: 59 | Attending: Orthopedic Surgery | Admitting: Orthopedic Surgery

## 2018-05-09 ENCOUNTER — Encounter (HOSPITAL_COMMUNITY)
Admission: RE | Disposition: A | Discharge status: Home / Self Care | Payer: Self-pay | Source: Home / Self Care | Attending: Orthopedic Surgery

## 2018-05-09 ENCOUNTER — Other Ambulatory Visit: Payer: Self-pay

## 2018-05-09 ENCOUNTER — Ambulatory Visit (HOSPITAL_COMMUNITY): Payer: 59 | Admitting: Anesthesiology

## 2018-05-09 ENCOUNTER — Encounter (HOSPITAL_COMMUNITY): Payer: Self-pay | Admitting: *Deleted

## 2018-05-09 DIAGNOSIS — Z6830 Body mass index (BMI) 30.0-30.9, adult: Secondary | ICD-10-CM | POA: Insufficient documentation

## 2018-05-09 DIAGNOSIS — J4520 Mild intermittent asthma, uncomplicated: Secondary | ICD-10-CM | POA: Diagnosis not present

## 2018-05-09 DIAGNOSIS — I110 Hypertensive heart disease with heart failure: Secondary | ICD-10-CM | POA: Diagnosis not present

## 2018-05-09 DIAGNOSIS — S92242A Displaced fracture of medial cuneiform of left foot, initial encounter for closed fracture: Secondary | ICD-10-CM | POA: Insufficient documentation

## 2018-05-09 DIAGNOSIS — S92342A Displaced fracture of fourth metatarsal bone, left foot, initial encounter for closed fracture: Secondary | ICD-10-CM | POA: Diagnosis not present

## 2018-05-09 DIAGNOSIS — S92322A Displaced fracture of second metatarsal bone, left foot, initial encounter for closed fracture: Secondary | ICD-10-CM | POA: Diagnosis not present

## 2018-05-09 DIAGNOSIS — N400 Enlarged prostate without lower urinary tract symptoms: Secondary | ICD-10-CM | POA: Insufficient documentation

## 2018-05-09 DIAGNOSIS — I509 Heart failure, unspecified: Secondary | ICD-10-CM | POA: Insufficient documentation

## 2018-05-09 DIAGNOSIS — Z79899 Other long term (current) drug therapy: Secondary | ICD-10-CM | POA: Insufficient documentation

## 2018-05-09 DIAGNOSIS — E669 Obesity, unspecified: Secondary | ICD-10-CM | POA: Diagnosis not present

## 2018-05-09 DIAGNOSIS — Z7951 Long term (current) use of inhaled steroids: Secondary | ICD-10-CM | POA: Diagnosis not present

## 2018-05-09 DIAGNOSIS — I701 Atherosclerosis of renal artery: Secondary | ICD-10-CM | POA: Diagnosis not present

## 2018-05-09 DIAGNOSIS — Z87310 Personal history of (healed) osteoporosis fracture: Secondary | ICD-10-CM | POA: Insufficient documentation

## 2018-05-09 DIAGNOSIS — J449 Chronic obstructive pulmonary disease, unspecified: Secondary | ICD-10-CM | POA: Diagnosis not present

## 2018-05-09 DIAGNOSIS — S93325A Dislocation of tarsometatarsal joint of left foot, initial encounter: Secondary | ICD-10-CM | POA: Diagnosis not present

## 2018-05-09 DIAGNOSIS — S92332A Displaced fracture of third metatarsal bone, left foot, initial encounter for closed fracture: Secondary | ICD-10-CM | POA: Diagnosis not present

## 2018-05-09 DIAGNOSIS — S92312A Displaced fracture of first metatarsal bone, left foot, initial encounter for closed fracture: Secondary | ICD-10-CM | POA: Diagnosis not present

## 2018-05-09 DIAGNOSIS — G4730 Sleep apnea, unspecified: Secondary | ICD-10-CM | POA: Diagnosis not present

## 2018-05-09 DIAGNOSIS — S92902A Unspecified fracture of left foot, initial encounter for closed fracture: Secondary | ICD-10-CM | POA: Diagnosis present

## 2018-05-09 HISTORY — PX: ORIF ANKLE FRACTURE: SHX5408

## 2018-05-09 HISTORY — DX: Heart failure, unspecified: I50.9

## 2018-05-09 HISTORY — DX: Anxiety disorder, unspecified: F41.9

## 2018-05-09 HISTORY — DX: Sleep apnea, unspecified: G47.30

## 2018-05-09 HISTORY — PX: APPLICATION OF WOUND VAC: SHX5189

## 2018-05-09 HISTORY — DX: Chronic obstructive pulmonary disease, unspecified: J44.9

## 2018-05-09 SURGERY — OPEN REDUCTION INTERNAL FIXATION (ORIF) ANKLE FRACTURE
Anesthesia: Monitor Anesthesia Care | Site: Foot | Laterality: Left

## 2018-05-09 MED ORDER — ALBUMIN HUMAN 5 % IV SOLN
INTRAVENOUS | Status: DC | PRN
  Administered 2018-05-09: 15:00:00 via INTRAVENOUS

## 2018-05-09 MED ORDER — PROPOFOL 500 MG/50ML IV EMUL
INTRAVENOUS | Status: DC | PRN
  Administered 2018-05-09: 100 ug/kg/min via INTRAVENOUS

## 2018-05-09 MED ORDER — BUPIVACAINE-EPINEPHRINE (PF) 0.5% -1:200000 IJ SOLN
INTRAMUSCULAR | Status: DC | PRN
  Administered 2018-05-09 (×2): 15 mL via PERINEURAL

## 2018-05-09 MED ORDER — EPHEDRINE SULFATE 50 MG/ML IJ SOLN
INTRAMUSCULAR | Status: DC | PRN
  Administered 2018-05-09: 5 mg via INTRAVENOUS
  Administered 2018-05-09 (×3): 10 mg via INTRAVENOUS
  Administered 2018-05-09: 15 mg via INTRAVENOUS

## 2018-05-09 MED ORDER — DOCUSATE SODIUM 100 MG PO CAPS
100.0000 mg | ORAL_CAPSULE | Freq: Two times a day (BID) | ORAL | Status: DC
  Administered 2018-05-09: 100 mg via ORAL
  Filled 2018-05-09 (×2): qty 1

## 2018-05-09 MED ORDER — CHLORHEXIDINE GLUCONATE 4 % EX LIQD: 60.0000 mL | Freq: Once | CUTANEOUS | Status: DC

## 2018-05-09 MED ORDER — METOCLOPRAMIDE HCL 5 MG PO TABS: 5.0000 mg | ORAL_TABLET | Freq: Three times a day (TID) | ORAL | Status: DC | PRN

## 2018-05-09 MED ORDER — BUPIVACAINE LIPOSOME 1.3 % IJ SUSP
INTRAMUSCULAR | Status: DC | PRN
  Administered 2018-05-09: 10 mL via PERINEURAL

## 2018-05-09 MED ORDER — HYDROMORPHONE HCL 1 MG/ML IJ SOLN: 0.5000 mg | INTRAMUSCULAR | Status: DC | PRN

## 2018-05-09 MED ORDER — LACTATED RINGERS IV SOLN
INTRAVENOUS | Status: DC
  Administered 2018-05-09 (×2): via INTRAVENOUS

## 2018-05-09 MED ORDER — PROPOFOL 1000 MG/100ML IV EMUL
INTRAVENOUS | Status: AC
  Filled 2018-05-09: qty 100

## 2018-05-09 MED ORDER — GLYCOPYRROLATE PF 0.2 MG/ML IJ SOSY
PREFILLED_SYRINGE | INTRAMUSCULAR | Status: DC | PRN
  Administered 2018-05-09 (×2): .1 mg via INTRAVENOUS

## 2018-05-09 MED ORDER — LIDOCAINE 2% (20 MG/ML) 5 ML SYRINGE
INTRAMUSCULAR | Status: AC
  Filled 2018-05-09: qty 20

## 2018-05-09 MED ORDER — PHENYLEPHRINE 40 MCG/ML (10ML) SYRINGE FOR IV PUSH (FOR BLOOD PRESSURE SUPPORT)
PREFILLED_SYRINGE | INTRAVENOUS | Status: AC
  Filled 2018-05-09: qty 10

## 2018-05-09 MED ORDER — LOSARTAN POTASSIUM 50 MG PO TABS
100.0000 mg | ORAL_TABLET | Freq: Every day | ORAL | Status: DC
  Administered 2018-05-10: 100 mg via ORAL
  Filled 2018-05-09: qty 2

## 2018-05-09 MED ORDER — CEFAZOLIN SODIUM-DEXTROSE 2-4 GM/100ML-% IV SOLN
INTRAVENOUS | Status: AC
  Filled 2018-05-09: qty 100

## 2018-05-09 MED ORDER — ACETAMINOPHEN 325 MG PO TABS: 325.0000 mg | ORAL_TABLET | ORAL | Status: DC | PRN

## 2018-05-09 MED ORDER — MIDAZOLAM HCL 2 MG/2ML IJ SOLN
INTRAMUSCULAR | Status: AC
  Administered 2018-05-09: 2 mg
  Filled 2018-05-09: qty 2

## 2018-05-09 MED ORDER — LIDOCAINE 2% (20 MG/ML) 5 ML SYRINGE
INTRAMUSCULAR | Status: DC | PRN
  Administered 2018-05-09: 50 mg via INTRAVENOUS

## 2018-05-09 MED ORDER — SUCCINYLCHOLINE CHLORIDE 200 MG/10ML IV SOSY
PREFILLED_SYRINGE | INTRAVENOUS | Status: AC
  Filled 2018-05-09: qty 10

## 2018-05-09 MED ORDER — OXYCODONE HCL 5 MG/5ML PO SOLN: 5.0000 mg | Freq: Once | ORAL | Status: DC | PRN

## 2018-05-09 MED ORDER — PHENYLEPHRINE 40 MCG/ML (10ML) SYRINGE FOR IV PUSH (FOR BLOOD PRESSURE SUPPORT)
PREFILLED_SYRINGE | INTRAVENOUS | Status: DC | PRN
  Administered 2018-05-09: 160 ug via INTRAVENOUS
  Administered 2018-05-09: 40 ug via INTRAVENOUS
  Administered 2018-05-09: 80 ug via INTRAVENOUS
  Administered 2018-05-09: 160 ug via INTRAVENOUS

## 2018-05-09 MED ORDER — METOPROLOL SUCCINATE ER 25 MG PO TB24
150.0000 mg | ORAL_TABLET | Freq: Every day | ORAL | Status: DC
  Administered 2018-05-10: 150 mg via ORAL
  Filled 2018-05-09: qty 2

## 2018-05-09 MED ORDER — OXYCODONE HCL 5 MG PO TABS: 10.0000 mg | ORAL_TABLET | ORAL | Status: DC | PRN

## 2018-05-09 MED ORDER — ONDANSETRON HCL 4 MG/2ML IJ SOLN: 4.0000 mg | Freq: Four times a day (QID) | INTRAMUSCULAR | Status: DC | PRN

## 2018-05-09 MED ORDER — OXYCODONE HCL 5 MG PO TABS
5.0000 mg | ORAL_TABLET | ORAL | Status: DC | PRN
  Administered 2018-05-10: 03:00:00 10 mg via ORAL
  Filled 2018-05-09 (×2): qty 2

## 2018-05-09 MED ORDER — CEFAZOLIN SODIUM-DEXTROSE 2-4 GM/100ML-% IV SOLN
2.0000 g | INTRAVENOUS | Status: AC
  Administered 2018-05-09: 2 g via INTRAVENOUS

## 2018-05-09 MED ORDER — ACETAMINOPHEN 160 MG/5ML PO SOLN: 325.0000 mg | ORAL | Status: DC | PRN

## 2018-05-09 MED ORDER — 0.9 % SODIUM CHLORIDE (POUR BTL) OPTIME
TOPICAL | Status: DC | PRN
  Administered 2018-05-09: 1000 mL

## 2018-05-09 MED ORDER — MIDAZOLAM HCL 2 MG/2ML IJ SOLN
INTRAMUSCULAR | Status: AC
  Filled 2018-05-09: qty 2

## 2018-05-09 MED ORDER — ONDANSETRON HCL 4 MG/2ML IJ SOLN: 4.0000 mg | Freq: Once | INTRAMUSCULAR | Status: DC | PRN

## 2018-05-09 MED ORDER — OXYCODONE HCL 5 MG PO TABS: 5.0000 mg | ORAL_TABLET | Freq: Once | ORAL | Status: DC | PRN

## 2018-05-09 MED ORDER — PROPOFOL 10 MG/ML IV BOLUS
INTRAVENOUS | Status: AC
  Filled 2018-05-09: qty 40

## 2018-05-09 MED ORDER — FENTANYL CITRATE (PF) 100 MCG/2ML IJ SOLN
INTRAMUSCULAR | Status: AC
  Administered 2018-05-09: 100 ug
  Filled 2018-05-09: qty 2

## 2018-05-09 MED ORDER — ACETAMINOPHEN 325 MG PO TABS: 325.0000 mg | ORAL_TABLET | Freq: Four times a day (QID) | ORAL | Status: DC | PRN

## 2018-05-09 MED ORDER — SPIRONOLACTONE 25 MG PO TABS
25.0000 mg | ORAL_TABLET | Freq: Every day | ORAL | Status: DC
  Administered 2018-05-10: 25 mg via ORAL
  Filled 2018-05-09: qty 1

## 2018-05-09 MED ORDER — METOCLOPRAMIDE HCL 5 MG/ML IJ SOLN: 5.0000 mg | Freq: Three times a day (TID) | INTRAMUSCULAR | Status: DC | PRN

## 2018-05-09 MED ORDER — FENTANYL CITRATE (PF) 250 MCG/5ML IJ SOLN
INTRAMUSCULAR | Status: AC
  Filled 2018-05-09: qty 5

## 2018-05-09 MED ORDER — ONDANSETRON HCL 4 MG PO TABS: 4.0000 mg | ORAL_TABLET | Freq: Four times a day (QID) | ORAL | Status: DC | PRN

## 2018-05-09 MED ORDER — MIDAZOLAM HCL 5 MG/5ML IJ SOLN
INTRAMUSCULAR | Status: DC | PRN
  Administered 2018-05-09: 2 mg via INTRAVENOUS

## 2018-05-09 MED ORDER — ACETAMINOPHEN 500 MG PO TABS
1000.0000 mg | ORAL_TABLET | Freq: Four times a day (QID) | ORAL | Status: DC
  Administered 2018-05-09 – 2018-05-10 (×2): 1000 mg via ORAL
  Filled 2018-05-09 (×2): qty 2

## 2018-05-09 MED ORDER — FENTANYL CITRATE (PF) 100 MCG/2ML IJ SOLN: 25.0000 ug | INTRAMUSCULAR | Status: DC | PRN

## 2018-05-09 MED ORDER — AMLODIPINE BESYLATE 5 MG PO TABS
5.0000 mg | ORAL_TABLET | Freq: Every day | ORAL | Status: DC
  Administered 2018-05-10: 09:00:00 5 mg via ORAL
  Filled 2018-05-09: qty 1

## 2018-05-09 MED ORDER — MEPERIDINE HCL 25 MG/ML IJ SOLN: 6.2500 mg | INTRAMUSCULAR | Status: DC | PRN

## 2018-05-09 MED ORDER — ALPRAZOLAM 0.5 MG PO TABS: 0.5000 mg | ORAL_TABLET | Freq: Every evening | ORAL | Status: DC | PRN

## 2018-05-09 MED ORDER — ONDANSETRON HCL 4 MG/2ML IJ SOLN
INTRAMUSCULAR | Status: DC | PRN
  Administered 2018-05-09: 4 mg via INTRAVENOUS

## 2018-05-09 MED ORDER — FENTANYL CITRATE (PF) 100 MCG/2ML IJ SOLN
INTRAMUSCULAR | Status: DC | PRN
  Administered 2018-05-09: 50 ug via INTRAVENOUS

## 2018-05-09 SURGICAL SUPPLY — 53 items
BANDAGE ESMARK 6X9 LF (GAUZE/BANDAGES/DRESSINGS) IMPLANT
BIT DRILL 3.2 CANN STRGHT (BIT) ×3 IMPLANT
BIT DRILL 3.5 CANN STRL (BIT) ×3 IMPLANT
BNDG COHESIVE 4X5 TAN STRL (GAUZE/BANDAGES/DRESSINGS) ×3 IMPLANT
BNDG COHESIVE 6X5 TAN STRL LF (GAUZE/BANDAGES/DRESSINGS) ×3 IMPLANT
BNDG ESMARK 6X9 LF (GAUZE/BANDAGES/DRESSINGS)
BNDG GAUZE ELAST 4 BULKY (GAUZE/BANDAGES/DRESSINGS) ×3 IMPLANT
CANISTER WOUNDNEG PRESSURE 500 (CANNISTER) ×3 IMPLANT
COVER SURGICAL LIGHT HANDLE (MISCELLANEOUS) ×3 IMPLANT
COVER WAND RF STERILE (DRAPES) ×3 IMPLANT
DRAPE INCISE IOBAN 85X60 (DRAPES) ×3 IMPLANT
DRAPE OEC MINIVIEW 54X84 (DRAPES) IMPLANT
DRAPE U-SHAPE 47X51 STRL (DRAPES) ×3 IMPLANT
DRILL ENDOSCOPIC QF COMPR FOOT (DRILL) ×1 IMPLANT
DRILL PROFILE (DRILL) ×3
DRSG ADAPTIC 3X8 NADH LF (GAUZE/BANDAGES/DRESSINGS) ×3 IMPLANT
DRSG PAD ABDOMINAL 8X10 ST (GAUZE/BANDAGES/DRESSINGS) ×3 IMPLANT
DURAPREP 26ML APPLICATOR (WOUND CARE) ×3 IMPLANT
ELECT REM PT RETURN 9FT ADLT (ELECTROSURGICAL) ×3
ELECTRODE REM PT RTRN 9FT ADLT (ELECTROSURGICAL) ×1 IMPLANT
GAUZE SPONGE 4X4 12PLY STRL (GAUZE/BANDAGES/DRESSINGS) ×3 IMPLANT
GAUZE SPONGE 4X4 12PLY STRL LF (GAUZE/BANDAGES/DRESSINGS) ×3 IMPLANT
GLOVE BIOGEL PI IND STRL 9 (GLOVE) ×1 IMPLANT
GLOVE BIOGEL PI INDICATOR 9 (GLOVE) ×2
GLOVE SURG ORTHO 9.0 STRL STRW (GLOVE) ×3 IMPLANT
GOWN STRL REUS W/ TWL XL LVL3 (GOWN DISPOSABLE) ×3 IMPLANT
GOWN STRL REUS W/TWL XL LVL3 (GOWN DISPOSABLE) ×6
GUIDEWIRE .045XTROC TIP LSR LN (WIRE) ×2 IMPLANT
K-WIRE 1.1 (WIRE) ×4
KIT BASIN OR (CUSTOM PROCEDURE TRAY) ×3 IMPLANT
KIT DRSG PREVENA PLUS 7DAY 125 (MISCELLANEOUS) ×3 IMPLANT
KIT PREVENA INCISION MGT 13 (CANNISTER) ×3 IMPLANT
KIT TURNOVER KIT B (KITS) ×3 IMPLANT
MANIFOLD NEPTUNE II (INSTRUMENTS) ×3 IMPLANT
NS IRRIG 1000ML POUR BTL (IV SOLUTION) ×3 IMPLANT
PACK ORTHO EXTREMITY (CUSTOM PROCEDURE TRAY) ×3 IMPLANT
PAD ARMBOARD 7.5X6 YLW CONV (MISCELLANEOUS) ×6 IMPLANT
PLATE LONG TI 37.85 4H (Plate) ×3 IMPLANT
SCREW COMP FT STANDARD 4.0X38 (Screw) ×3 IMPLANT
SCREW COMP FT STD 4.0X38 (Screw) ×1 IMPLANT
SCREW CORT TI 3.5X26 (Screw) ×6 IMPLANT
SCREW LO-PRO TI 3.5X16MM (Screw) ×6 IMPLANT
SCREW LOCK 3.5X14MM (Screw) ×6 IMPLANT
STAPLER VISISTAT 35W (STAPLE) IMPLANT
SUCTION FRAZIER HANDLE 10FR (MISCELLANEOUS) ×2
SUCTION TUBE FRAZIER 10FR DISP (MISCELLANEOUS) ×1 IMPLANT
SUT ETHILON 2 0 PSLX (SUTURE) ×6 IMPLANT
SUT VIC AB 2-0 CT1 27 (SUTURE) ×2
SUT VIC AB 2-0 CT1 TAPERPNT 27 (SUTURE) ×1 IMPLANT
TOWEL OR 17X24 6PK STRL BLUE (TOWEL DISPOSABLE) ×3 IMPLANT
TOWEL OR 17X26 10 PK STRL BLUE (TOWEL DISPOSABLE) ×3 IMPLANT
TUBE CONNECTING 12'X1/4 (SUCTIONS) ×1
TUBE CONNECTING 12X1/4 (SUCTIONS) ×2 IMPLANT

## 2018-05-09 NOTE — Progress Notes (Signed)
Orthopedic Tech Progress Note Patient Details:  Louis [name] April 01, 1955 [id number]  [name], male   DOB: Aug 30, 1955, 63 y.o.   MRN: [id number]   [name] 05/09/2018, 6:41 PMPt has cam walker at home. wife is going to drop off boot at 7am.

## 2018-05-09 NOTE — Op Note (Signed)
05/09/2018  3:25 PM  PATIENT:  Louis [name]    PRE-OPERATIVE DIAGNOSIS:  Left Lisfranc Fracture/Dislocation  POST-OPERATIVE DIAGNOSIS:  Same  PROCEDURE:  OPEN REDUCTION INTERNAL FIXATION LEFT LISFRANC FRACTURE/DISLOCATION, Application Of Wound Vac C arm fluoroscopy to verify alignment.  SURGEON:  Newt Minion, MD  PHYSICIAN ASSISTANT:None ANESTHESIA:   General  PREOPERATIVE INDICATIONS:  Louis [name] is a  63 y.o. male with a diagnosis of Left Lisfranc Fracture/Dislocation who failed conservative measures and elected for surgical management.    The risks benefits and alternatives were discussed with the patient preoperatively including but not limited to the risks of infection, bleeding, nerve injury, cardiopulmonary complications, the need for revision surgery, among others, and the patient was willing to proceed.  OPERATIVE IMPLANTS: Arthrex headless cannulated screw and a T plate.  @ENCIMAGES @  OPERATIVE FINDINGS: Complete disruption of the ligament is complex the base of the first metatarsal medial cuneiform.  Stable alignment of the metaphyseal fractures of the third and fourth base of the metatarsals which were not fixed with internal fixation.  OPERATIVE PROCEDURE: Patient was brought to the operating room after undergoing a regional block.  The left lower extremity was prepped using DuraPrep draped into a sterile field a timeout was called.  A dorsal incision was made over the second metatarsal.  Blunt dissection was carried down to protect the small branches of the superficial peroneal nerve.  Attention was first focused at the base of the first metatarsal.  The complex was completely disrupted.  The fracture was cleansed the base of the first metatarsal and medial cuneiform were reduced and a guidewire was placed distal dorsal to plantar proximal.  C-arm fluoroscopy verified alignment of the joint.  This was then secured with a 38 mm headless cannulated screw.  Attention was then  focused on the base of the second metatarsal.  The shaft fracture was reduced this was pinned with a K wire to hold the alignment and a dorsal plate was applied with 2 screws proximally and 2 screws distally.  C-arm fluoroscopy verified alignment.  With the alignment of the second metatarsal this brought the third and fourth metatarsal fractures into alignment.  These were both metaphyseal fractures and were not fixed with internal fixation.  Radiographs were used throughout the case to verify reduction.  The wound was irrigated with normal saline the incision was closed using 2-0 nylon.  A Praveena wound VAC was applied this was covered with a Covan wrap this had a good suction fit patient was then taken the PACU in stable condition.   DISCHARGE PLANNING:  Antibiotic duration: 24-hour IV antibiotics  Weightbearing: Nonweightbearing on the left  Pain medication: Opioid pathway  Dressing care/ Wound VAC: Continue wound VAC  Ambulatory devices: Walker scooter or wheelchair  Discharge to: Plan for overnight observation due to patient's need for a CPAP machine with supplemental oxygen.  Plan for discharge in the morning.  Follow-up: In the office 1 week post operative.

## 2018-05-09 NOTE — Progress Notes (Signed)
NIV is set up and ready for use. Education and demonstration given to patient on the proper use of the machine. Patient verbalize understanding. Patient states will use machine once he is ready for bed. Pressure set per patient comfort.

## 2018-05-09 NOTE — Anesthesia Procedure Notes (Signed)
Procedure Name: MAC Date/Time: 05/09/2018 1:55 PM Performed by: Leonor Liv, CRNA Pre-anesthesia Checklist: Patient identified, Emergency Drugs available, Suction available, Patient being monitored and Timeout performed Patient Re-evaluated:Patient Re-evaluated prior to induction Oxygen Delivery Method: Simple face mask Placement Confirmation: positive ETCO2 Dental Injury: Teeth and Oropharynx as per pre-operative assessment

## 2018-05-09 NOTE — Anesthesia Procedure Notes (Signed)
Anesthesia Regional Block: Adductor canal block   Pre-Anesthetic Checklist: ,, timeout performed, Correct Patient, Correct Site, Correct Laterality, Correct Procedure, Correct Position, site marked, Risks and benefits discussed,  Surgical consent,  Pre-op evaluation,  At surgeon's request and post-op pain management  Laterality: Left  Prep: chloraprep       Needles:  Injection technique: Single-shot  Needle Type: Echogenic Stimulator Needle     Needle Length: 5cm  Needle Gauge: 22     Additional Needles:   Procedures:, nerve stimulator,,, ultrasound used (permanent image in chart),,,,  Narrative:  Start time: 05/09/2018 11:53 AM End time: 05/09/2018 11:57 AM Injection made incrementally with aspirations every 5 mL.  Performed by: Personally  Anesthesiologist: Janeece Riggers, MD  Additional Notes: Functioning IV was confirmed and monitors were applied.  A 28mm 22ga Arrow echogenic stimulator needle was used. Sterile prep and drape,hand hygiene and sterile gloves were used. Ultrasound guidance: relevant anatomy identified, needle position confirmed, local anesthetic spread visualized around nerve(s)., vascular puncture avoided.  Image printed for medical record. Negative aspiration and negative test dose prior to incremental administration of local anesthetic. The patient tolerated the procedure well.

## 2018-05-09 NOTE — Anesthesia Preprocedure Evaluation (Addendum)
Anesthesia Evaluation  Patient identified by MRN, date of birth, ID band Patient awake    Reviewed: Allergy & Precautions, H&P , NPO status , [name] Chart, lab work & pertinent test results, reviewed documented beta blocker date and time   Airway Mallampati: II  TM Distance: >3 FB Neck ROM: full    Dental no notable dental hx.    Pulmonary asthma , sleep apnea and Continuous Positive Airway Pressure Ventilation ,    Pulmonary exam normal breath sounds clear to auscultation       Cardiovascular Exercise Tolerance: Good hypertension, Pt. on medications and Pt. on home beta blockers +CHF  + dysrhythmias  Rhythm:regular Rate:Normal  Echo  Left ventricle:  The cavity size was normal. There was mild concentric hypertrophy. Systolic function was normal. The estimated ejection fraction was in the range of 60% to 65%. Wall motion was normal; there were no regional wall motion abnormalities. Features are consistent with a pseudonormal left ventricular filling pattern, with concomitant abnormal relaxation and increased filling pressure (grade 2 diastolic dysfunction). There was no evidence of elevated ventricular filling pressure by Doppler parameters.   Neuro/Psych Anxiety negative neurological ROS  negative psych ROS   GI/Hepatic Neg liver ROS, GERD  Medicated,  Endo/Other  negative endocrine ROS  Renal/GU negative Renal ROS  negative genitourinary   Musculoskeletal   Abdominal   Peds  Hematology negative hematology ROS (+)   Anesthesia Other Findings   Reproductive/Obstetrics negative OB ROS                            Anesthesia Physical Anesthesia Plan  ASA: III  Anesthesia Plan: General   Post-op Pain Management: GA combined w/ Regional for post-op pain   Induction: Intravenous  PONV Risk Score and Plan: 2 and Ondansetron and Treatment may vary due to age or medical  condition  Airway Management Planned: LMA and Oral ETT  Additional Equipment:   Intra-op Plan:   Post-operative Plan:   Informed Consent: I have reviewed the patients History and Physical, chart, labs and discussed the procedure including the risks, benefits and alternatives for the proposed anesthesia with the patient or authorized representative who has indicated his/her understanding and acceptance.     Dental Advisory Given  Plan Discussed with: CRNA, Anesthesiologist and Surgeon  Anesthesia Plan Comments: ( )        Anesthesia Quick Evaluation

## 2018-05-09 NOTE — Anesthesia Procedure Notes (Addendum)
Anesthesia Regional Block: Popliteal block   Pre-Anesthetic Checklist: ,, timeout performed, Correct Patient, Correct Site, Correct Laterality, Correct Procedure, Correct Position, site marked, Risks and benefits discussed,  Surgical consent,  Pre-op evaluation,  At surgeon's request and post-op pain management  Laterality: Left  Prep: chloraprep       Needles:  Injection technique: Single-shot  Needle Type: Echogenic Stimulator Needle     Needle Length: 5cm  Needle Gauge: 22     Additional Needles:   Procedures:, nerve stimulator,,, ultrasound used (permanent image in chart),,,,   Nerve Stimulator or Paresthesia:  Response: foot, 0.45 mA,   Additional Responses:   Narrative:  Start time: 05/09/2018 11:58 AM End time: 05/09/2018 12:05 PM Injection made incrementally with aspirations every 5 mL.  Performed by: Personally  Anesthesiologist: Janeece Riggers, MD  Additional Notes: Functioning IV was confirmed and monitors were applied.  A 44mm 22ga Arrow echogenic stimulator needle was used. Sterile prep and drape,hand hygiene and sterile gloves were used. Ultrasound guidance: relevant anatomy identified, needle position confirmed, local anesthetic spread visualized around nerve(s)., vascular puncture avoided.  Image printed for medical record. Negative aspiration and negative test dose prior to incremental administration of local anesthetic. The patient tolerated the procedure well.

## 2018-05-09 NOTE — Transfer of Care (Signed)
Immediate Anesthesia Transfer of Care Note  Patient: Louis [name]  Procedure(s) Performed: OPEN REDUCTION INTERNAL FIXATION LEFT LISFRANC FRACTURE/DISLOCATION (Left Foot) Application Of Wound Vac (Left Foot)  Patient Location: PACU  Anesthesia Type:MAC and Regional  Level of Consciousness: awake, alert  and oriented  Airway & Oxygen Therapy: Patient Spontanous Breathing and Patient connected to face mask oxygen  Post-op Assessment: Report given to RN and Post -op Vital signs reviewed and stable  Post vital signs: Reviewed and stable  Last Vitals:  Vitals Value Taken Time  BP 113/76   Temp    Pulse 68 05/09/2018  3:22 PM  Resp 17 05/09/2018  3:22 PM  SpO2 100 % 05/09/2018  3:22 PM  Vitals shown include unvalidated device data.  Last Pain:  Vitals:   05/09/18 1111  TempSrc: Oral         Complications: No apparent anesthesia complications

## 2018-05-09 NOTE — H&P (Signed)
Louis [name] is an 63 y.o. male.   Chief Complaint: Left foot Lisfranc fracture HPI: The patient is a 63 year old gentleman who presents with a history of trauma to his left foot.  Radiographs revealed fractures of the proximal second through fourth metatarsals.  The patient presents for open reduction internal fixation of his left foot Lisfranc fractures.  Past Medical History:  Diagnosis Date  . Anxiety    situational  . BPH (benign prostatic hyperplasia)    on flomax  . Bundle branch block    per prior PCP records  . CHF (congestive heart failure) ([place])   . COPD (chronic obstructive pulmonary disease) ([place])   . Diverticulosis    by colonoscopy  . Eczema    per prior PCP records  . Elevated PSA 2015   peaked 6s, s/p benign biopsy 2014, sees urology [name] ([place])  . Essential tremor   . GERD (gastroesophageal reflux disease)    per prior PCP records  . History of panic attacks    per prior PCP records  . HTN (hypertension)   . Mild intermittent asthma in adult without complication   . Obesity, Class I, BMI 30-34.9   . Osteoporosis    DEXA 06/2013 with osteopenia - h/o ?wrist/hip fracture from AVN from chronic steroid use (asthma), took reclast for 1 year  . Renal artery stenosis in 1 of 2 vessels ([place]) 2011   by [place]  . Seasonal allergies   . Sleep apnea   . Thoracic scoliosis childhood  . Transaminitis    per prior PCP records    Past Surgical History:  Procedure Laterality Date  . COLONOSCOPY  12/2013   polyps, int hem, diverticulosis, rpt 5 yrs ([place])  . ELBOW SURGERY Right 2008   golfer's elbow  . INGUINAL HERNIA REPAIR Bilateral childhood & ~1995  . NASAL SEPTUM SURGERY  2013   deviated septum  . US ECHOCARDIOGRAPHY  02/2009   WNL, EF >55% [facility])  . US RENAL/AORTA Right 05/2009   1-59% diameter reduction renal artery, rec rpt 2 yrs    Family History  Problem Relation Age of Onset  . Cancer Father 2       colon  . Prostate cancer Father   . Diabetes  Mother   . Alcohol abuse Mother   . Stroke Mother   . Cancer Maternal Grandmother        pancreatic  . Ataxia Brother   . Ataxia Sister   . CAD Neg Hx    Social History:  reports that he has never smoked. He has never used smokeless tobacco. He reports current alcohol use of about 14.0 standard drinks of alcohol per week. He reports that he does not use drugs.  Allergies:  Allergies  Allergen Reactions  . Accupril [Quinapril Hcl] Cough    No medications prior to admission.    No results found for this or any previous visit (from the past 48 hour(s)). No results found.  Review of Systems  All other systems reviewed and are negative.   There were no vitals taken for this visit. Physical Exam  Constitutional: He is oriented to person, place, and time. He appears well-developed and well-nourished. No distress.  HENT:  Head: Normocephalic and atraumatic.  Neck: No tracheal deviation present. No thyromegaly present.  Cardiovascular: Normal rate and regular rhythm.  Respiratory: Effort normal. No stridor. No respiratory distress.  GI: Soft.  Musculoskeletal:     Comments: There is swelling and bruising over the left  foot and tenderness to palpation over the midfoot.  He has intact pedal pulses and is neurologically intact distally.  Neurological: He is alert and oriented to person, place, and time. No cranial nerve deficit. Coordination normal.  Skin: Skin is warm.  Psychiatric: He has a normal mood and affect. His behavior is normal. Judgment and thought content normal.     Assessment/Plan Left foot Lisfranc fracture-plan open reduction internal fixation of left foot Lisfranc fracture- the procedure and possible benefits and risks including the risk of bleeding, infection, neurovascular injury, and possible need for further surgery were discussed at length with the patient and the patient's questions were answered to his satisfaction.  The patient desires to proceed with surgery  at this time.  Erlinda Hong, PA-C 05/09/2018, 7:40 AM [facility] [phone]

## 2018-05-10 DIAGNOSIS — S92312A Displaced fracture of first metatarsal bone, left foot, initial encounter for closed fracture: Secondary | ICD-10-CM | POA: Diagnosis not present

## 2018-05-10 NOTE — Care Management (Addendum)
Pt to get OOB with therapy today.  It is anticipated that he will need a RW and a 3n1.  DME ordered to be delivered to room prior to d/c.   05/10/18 1000: Pt states he has a new walker at home.  He only needs 3n1.

## 2018-05-10 NOTE — Progress Notes (Signed)
[name], male   DOB: February 14, 1955, 63 y.o.   MRN: [id number] No drainage in the wound VAC canister.    Patient needs to have the fracture boot on at all times when ambulating with therapy he is strict nonweightbearing on the left lower extremity.    I reattached the wound VAC to the portable pump and he will discharge with the portable pump.  Plan for discharge today after therapy.

## 2018-05-10 NOTE — Plan of Care (Signed)
  Problem: Elimination: Goal: Will not experience complications related to bowel motility Outcome: Progressing   Problem: Pain Managment: Goal: General experience of comfort will improve Outcome: Progressing   Problem: Skin Integrity: Goal: Risk for impaired skin integrity will decrease Outcome: Progressing   

## 2018-05-10 NOTE — Discharge Summary (Signed)
Discharge Diagnoses:  Active Problems:   Lisfranc dislocation, left, initial encounter   Foot fracture, left, closed, initial encounter   Surgeries: Procedure(s): OPEN REDUCTION INTERNAL FIXATION LEFT LISFRANC FRACTURE/DISLOCATION Application Of Wound Vac on 05/09/2018    Consultants:   Discharged Condition: Improved  Hospital Course: Louis [name] is an 63 y.o. male who was admitted 05/09/2018 with a chief complaint of Lisfranc fracture dislocation left foot, with a final diagnosis of Left Lisfranc Fracture/Dislocation.  Patient was brought to the operating room on 05/09/2018 and underwent Procedure(s): OPEN REDUCTION INTERNAL FIXATION LEFT LISFRANC FRACTURE/DISLOCATION Application Of Wound Vac.    Patient was given perioperative antibiotics:  Anti-infectives (From admission, onward)   Start     Dose/Rate Route Frequency Ordered Stop   05/09/18 1115  ceFAZolin (ANCEF) IVPB 2g/100 mL premix     2 g 200 mL/hr over 30 Minutes Intravenous On call to O.R. 05/09/18 1106 05/09/18 1354   05/09/18 1111  ceFAZolin (ANCEF) 2-4 GM/100ML-% IVPB    Note to Pharmacy:  [facility]   : cabinet override      05/09/18 1111 05/09/18 1354    .  Patient was given sequential compression devices, early ambulation, and aspirin for DVT prophylaxis.  Recent vital signs:  Patient Vitals for the past 24 hrs:  BP Temp Temp src Pulse Resp SpO2 Height Weight  05/10/18 0527 - - - 62 - - - -  05/10/18 0444 110/68 98.7 F (37.1 C) Oral (!) 59 14 97 % - -  05/09/18 2341 108/68 98.1 F (36.7 C) Oral 62 16 97 % - -  05/09/18 2053 105/67 97.8 F (36.6 C) Oral 66 - 97 % - -  05/09/18 1643 125/82 (!) 97.3 F (36.3 C) - 63 16 99 % - -  05/09/18 1620 114/67 97.7 F (36.5 C) - 60 20 100 % - -  05/09/18 1605 104/74 - - 62 15 97 % - -  05/09/18 1553 133/72 - - 62 19 99 % - -  05/09/18 1537 127/78 - - 66 17 97 % - -  05/09/18 1523 113/76 (!) 97 F (36.1 C) - 65 18 100 % - -  05/09/18 1111 129/73 98.3 F (36.8  C) Oral 67 20 - 5\' 11"  (1.803 m) 99.8 kg  05/09/18 1103 - - - - - - 5\' 11"  (1.803 m) 99.5 kg  .  Recent laboratory studies: No results found.  Discharge Medications:   Allergies as of 05/10/2018      Reactions   Accupril [quinapril Hcl] Cough      Medication List    STOP taking these medications   doxycycline 100 MG tablet Commonly known as:  VIBRA-TABS     TAKE these medications   acetaminophen 500 MG tablet Commonly known as:  TYLENOL Take 1,000 mg by mouth every 6 (six) hours as needed.   ALPRAZolam 0.5 MG tablet Commonly known as:  XANAX Take 1 tablet (0.5 mg total) by mouth at bedtime as needed for sleep.   amLODipine 5 MG tablet Commonly known as:  NORVASC Take 1 tablet (5 mg total) by mouth daily.   calcipotriene 0.005 % ointment Commonly known as:  DOVONOX Apply 1 application topically 2 (two) times daily as needed (dermatitis).   halobetasol 0.05 % ointment Commonly known as:  ULTRAVATE Apply 1 application topically 2 (two) times daily as needed (dermatitis).   losartan 100 MG tablet Commonly known as:  COZAAR TAKE 1 TABLET BY MOUTH EVERY DAY(NEEDS ANNUAL PHYSICAL APPOINTMENT  FOR ADDITIONAL REFILLS What changed:    how much to take  how to take this  when to take this  additional instructions   metoprolol succinate 50 MG 24 hr tablet Commonly known as:  TOPROL-XL Take 3 tablets (150 mg total) by mouth daily. Take with or immediately following a meal.   naproxen sodium 220 MG tablet Commonly known as:  ALEVE Take 220 mg by mouth daily as needed (pain).   oxyCODONE-acetaminophen 5-325 MG tablet Commonly known as:  PERCOCET/ROXICET Take 1 tablet by mouth every 4 (four) hours as needed for severe pain.   sildenafil 20 MG tablet Commonly known as:  REVATIO Take 60 mg by mouth daily as needed (ED).   spironolactone 25 MG tablet Commonly known as:  ALDACTONE Take 1 tablet (25 mg total) by mouth daily.   Symbicort 160-4.5 MCG/ACT  inhaler Generic drug:  budesonide-formoterol INHALE 2 PUFFS INTO THE LUNGS TWICE DAILY   urea 40 % Crea Commonly known as:  CARMOL Apply 1 application topically 2 (two) times daily as needed (dry skin foot).   Vitamin D (Ergocalciferol) 1.25 MG (50000 UT) Caps capsule Commonly known as:  DRISDOL Take 50,000 Units by mouth every 7 (seven) days.            Discharge Care Instructions  (From admission, onward)         Start     Ordered   05/10/18 0000  Non weight bearing    Question Answer Comment  Laterality left   Extremity Lower      05/10/18 0806          Diagnostic Studies: No results found.  Patient benefited maximally from their hospital stay and there were no complications.     Disposition: Discharge disposition: 01-Home or Self Care      Discharge Instructions    Call MD / Call 911   Complete by:  As directed    If you experience chest pain or shortness of breath, CALL 911 and be transported to the hospital emergency room.  If you develope a fever above 101 F, pus (white drainage) or increased drainage or redness at the wound, or calf pain, call your surgeon's office.   Constipation Prevention   Complete by:  As directed    Drink plenty of fluids.  Prune juice may be helpful.  You may use a stool softener, such as Colace (over the counter) 100 mg twice a day.  Use MiraLax (over the counter) for constipation as needed.   Diet - low sodium heart healthy   Complete by:  As directed    Elevate operative extremity   Complete by:  As directed    Increase activity slowly as tolerated   Complete by:  As directed    Negative Pressure Wound Therapy - Incisional   Complete by:  As directed    Keep dressing clean dry and intact.  Plug in the wound VAC  to maintain its charge.  The wound VAC is to remain in place until follow-up in the office.   Non weight bearing   Complete by:  As directed    Laterality:  left   Extremity:  Lower     Follow-up Information     Newt Minion, MD In 1 week.   Specialty:  Orthopedic Surgery Contact information: [place] [address] [phone]            Signed: Newt Minion 05/10/2018, 8:06 AM

## 2018-05-10 NOTE — Progress Notes (Signed)
Pt given discharge instructions and gone over with him and wife, on the phone. Pt and wife verbalized understanding. All questions answered. 3N1 delivered to room. Prevena hooked up and functioning. All belongings gathered for discharge.

## 2018-05-10 NOTE — Evaluation (Signed)
Physical Therapy Evaluation Patient Details Name: Louis [name] MRN: [id number] DOB: Jan 05, 1955 Today's Date: 05/10/2018   History of Present Illness  Pt is a 63 y/o male s/p ORIF LEFT LISFRANC FRACTURE/DISLOCATION with application of Wound Vac. PMH including but not limited to CHF, COPD, HTN and obesity.    Clinical Impression  Pt presented supine in bed with HOB elevated, awake and willing to participate in therapy session. Prior to initial injury, pt reported that he was independent with all functional mobility and ADLs. Since his injury he has been using a knee scooter for mobility. Pt lives with his wife in a single level home with 4-5 steps to enter depending on entrance site. At the time of evaluation, pt limited secondary to pain and weakness. He tolerated bed mobility with min guard and transfers with min guard to min A for stability and safety. Pt required extensive demonstration and education re: technique and safety with use of DME as well as bumping up/down stairs in seated position. Pt's spouse involved in session as well via telephone and FaceTime. All questions were answered at end of session. PT will continue to follow acutely to progress mobility as tolerated.     Follow Up Recommendations No PT follow up;Supervision/Assistance - 24 hour    Equipment Recommendations  3in1 (PT)    Recommendations for Other Services       Precautions / Restrictions Precautions Precautions: Fall Precaution Comments: CAM boot at all times Restrictions Weight Bearing Restrictions: Yes LLE Weight Bearing: Non weight bearing      Mobility  Bed Mobility Overal bed mobility: Needs Assistance Bed Mobility: Supine to Sit     Supine to sit: Supervision     General bed mobility comments: increased time, HOB elevated  Transfers Overall transfer level: Needs assistance Equipment used: Rolling walker (2 wheeled) Transfers: Sit to/from [facility] Sit to Stand: Min  assist;Min guard Stand pivot transfers: Min guard       General transfer comment: increased time and effort, use of momentum, bed in elevated position, cueing and demonstration in safe technique with RW; pt a bit impulsive with pivot to chair  Ambulation/Gait             General Gait Details: unable to hop on R LE during eval  Stairs            Wheelchair Mobility    Modified Rankin (Stroke Patients Only)       Balance Overall balance assessment: Needs assistance Sitting-balance support: No upper extremity supported Sitting balance-Leahy Scale: Good     Standing balance support: Bilateral upper extremity supported;Single extremity supported Standing balance-Leahy Scale: Poor                               Pertinent Vitals/Pain Pain Assessment: Faces Faces Pain Scale: Hurts little more Pain Location: L foot Pain Descriptors / Indicators: Sore Pain Intervention(s): Monitored during session;Repositioned    Home Living Family/patient expects to be discharged to:: Private residence Living Arrangements: Spouse/significant other Available Help at Discharge: Family;Available 24 hours/day Type of Home: House Home Access: Stairs to enter Entrance Stairs-Rails: Psychiatric nurse of Steps: 5 Home Layout: Able to live on main level with bedroom/bathroom Home Equipment: Cane - single point;Crutches      Prior Function Level of Independence: Independent with assistive device(s)         Comments: was using a knee scooter just prior to surgery  Hand Dominance        Extremity/Trunk Assessment   Upper Extremity Assessment Upper Extremity Assessment: Overall WFL for tasks assessed    Lower Extremity Assessment Lower Extremity Assessment: LLE deficits/detail LLE Deficits / Details: pt with dressing and wound VAC in place; functionally pt able to lift L LE against gravity during bed mobility and maintained NWB with standing and  transfers independently LLE: Unable to fully assess due to pain;Unable to fully assess due to immobilization    Cervical / Trunk Assessment Cervical / Trunk Assessment: Normal  Communication   Communication: No difficulties  Cognition Arousal/Alertness: Awake/alert Behavior During Therapy: WFL for tasks assessed/performed Overall Cognitive Status: Within Functional Limits for tasks assessed                                        General Comments      Exercises     Assessment/Plan    PT Assessment Patient needs continued PT services  PT Problem List Decreased strength;Decreased balance;Decreased range of motion;Decreased activity tolerance;Decreased coordination;Decreased mobility;Decreased knowledge of precautions;Decreased knowledge of use of DME;Decreased safety awareness;Pain       PT Treatment Interventions Gait training;DME instruction;Stair training;Functional mobility training;Therapeutic activities;Neuromuscular re-education;Therapeutic exercise;Balance training;Patient/family education    PT Goals (Current goals can be found in the Care Plan section)  Acute Rehab PT Goals Patient Stated Goal: "home today" PT Goal Formulation: With patient/family Time For Goal Achievement: 05/24/18 Potential to Achieve Goals: Good    Frequency Min 3X/week   Barriers to discharge        Co-evaluation               AM-PAC PT "6 Clicks" Mobility  Outcome Measure Help needed turning from your back to your side while in a flat bed without using bedrails?: None Help needed moving from lying on your back to sitting on the side of a flat bed without using bedrails?: None Help needed moving to and from a bed to a chair (including a wheelchair)?: A Little Help needed standing up from a chair using your arms (e.g., wheelchair or bedside chair)?: A Little Help needed to walk in hospital room?: A Lot Help needed climbing 3-5 steps with a railing? : Total 6 Click  Score: 17    End of Session Equipment Utilized During Treatment: Gait belt Activity Tolerance: Patient tolerated treatment well Patient left: in chair;with call bell/phone within reach Nurse Communication: Mobility status PT Visit Diagnosis: Other abnormalities of gait and mobility (R26.89);Pain Pain - Right/Left: Left Pain - part of body: Ankle and joints of foot    Time: 0922-1019 PT Time Calculation (min) (ACUTE ONLY): 57 min   Charges:   PT Evaluation $PT Eval Moderate Complexity: 1 Mod PT Treatments $Therapeutic Activity: 38-52 mins        Sherie Don, PT, DPT  Acute Rehabilitation Services Pager [phone] Office [place] 05/10/2018, 11:19 AM

## 2018-05-11 NOTE — Anesthesia Postprocedure Evaluation (Signed)
Anesthesia Post Note  Patient: Louis [name]  Procedure(s) Performed: OPEN REDUCTION INTERNAL FIXATION LEFT LISFRANC FRACTURE/DISLOCATION (Left Foot) Application Of Wound Vac (Left Foot)     Patient location during evaluation: PACU Anesthesia Type: MAC and Regional Level of consciousness: awake and alert Pain management: pain level controlled Vital Signs Assessment: post-procedure vital signs reviewed and stable Respiratory status: spontaneous breathing, nonlabored ventilation, respiratory function stable and patient connected to nasal cannula oxygen Cardiovascular status: stable and blood pressure returned to baseline Postop Assessment: no apparent nausea or vomiting Anesthetic complications: no    Last Vitals:  Vitals:   05/10/18 0830 05/10/18 1002  BP: 107/66 100/72  Pulse: 63 74  Resp:  18  Temp:  36.9 C  SpO2:  96%    Last Pain:  Vitals:   05/10/18 1002  TempSrc: Oral  PainSc:                  Daira Hine

## 2018-05-13 ENCOUNTER — Encounter (HOSPITAL_COMMUNITY): Payer: Self-pay | Admitting: Orthopedic Surgery

## 2018-05-14 ENCOUNTER — Encounter: Payer: Self-pay | Admitting: Family

## 2018-05-14 NOTE — Progress Notes (Signed)
Office Visit Note   Patient: Louis [name]           Date of Birth: 15-Jun-1955           MRN: [id number] Visit Date: 05/07/2018              Requested by: Ria Bush, MD [address] [place], [place] [address] PCP: Ria Bush, MD  Chief Complaint  Patient presents with  . Left Foot - Pain      HPI: The patient is a 63 year old gentleman who presents for initial evaluation of left foot pain.  Last night he was walking down his back steps to walk the dog and slipped and fell.  Had immediate onset of excruciating pain.  Wonders if he broke his foot.  Is unable to bear weight.  Today he is nonweightbearing has crutches.  There is a small abrasion that he is also worried about on the dorsum of his foot.  Assessment & Plan: Visit Diagnoses:  1. Left foot pain     Plan: Radiographs revealing for fracture and Lisfranc injury.  Have discussed the case with Dr. Sharol Given.  Plan for operative management on Friday patient is in agreement with the plan.  Dr. Sharol Given to call and discussed with his wife [name].  Placed in a cam walker.  He is to be nonweightbearing.  I provide a prescription for Percocet for his pain.  Follow-Up Instructions: Return in about 2 weeks (around 05/21/2018).   Ortho Exam  Patient is alert, oriented, no adenopathy, well-dressed, normal affect, normal respiratory effort. On examination there is a 1 cm abrasion to the lateral dorsum of his foot this is superficial.  There is moderate swelling.  Global tenderness.  No obvious deformity.  Does have a palpable PT pulse.  Imaging: No results found. No images are attached to the encounter.  Labs: Lab Results  Component Value Date   REPTSTATUS 07/07/2017 FINAL 07/02/2017   CULT  07/02/2017    NO GROWTH 5 DAYS Performed at [facility], [place] [address], [place], [place] [address]      Lab Results  Component Value Date   ALBUMIN 3.9 07/02/2017   ALBUMIN 4.6 07/30/2016   ALBUMIN 4.5 09/26/2015    Body mass index is 30.6 kg/m.  Orders:  Orders Placed This Encounter  Procedures  . XR Foot 2 Views Left   Meds ordered this encounter  Medications  . oxyCODONE-acetaminophen (PERCOCET/ROXICET) 5-325 MG tablet    Sig: Take 1 tablet by mouth every 4 (four) hours as needed for severe pain.    Dispense:  30 tablet    Refill:  0     Procedures: No procedures performed  Clinical Data: No additional findings.  ROS:  All other systems negative, except as noted in the HPI. Review of Systems  Constitutional: Negative for chills and fever.  Musculoskeletal: Positive for arthralgias and myalgias.  Skin: Positive for color change and wound.    Objective: Vital Signs: Ht 5\' 11"  (1.803 m)   Wt 219 lb 6.4 oz (99.5 kg)   BMI 30.60 kg/m   Specialty Comments:  No specialty comments available.  PMFS History: Patient Active Problem List   Diagnosis Date Noted  . Foot fracture, left, closed, initial encounter 05/09/2018  . Lisfranc dislocation, left, initial encounter   . Acute bronchitis 02/25/2018  . Primary pulmonary hypertension ([place]) 07/09/2017  . Diastolic dysfunction 67/12/4578  . OSA (obstructive sleep apnea) 07/08/2017  . SOB (shortness of  breath) 07/02/2017  . Chronic respiratory failure with hypoxia and hypercapnia ([facility]) 07/02/2017  . Cardiomegaly 07/02/2017  . Exposure keratitis 02/17/2017  . Pedal edema 12/18/2016  . Transaminitis 12/18/2016  . Bilateral pes planus 12/18/2016  . Psoriasis-eczema overlap condition 07/30/2016  . High serum high density lipoprotein (HDL) 09/26/2015  . Epistaxis 09/26/2015  . Renal artery stenosis in 1 of 2 vessels ([facility])   . Essential tremor   . Elevated PSA   . GERD (gastroesophageal reflux disease)   . Health maintenance examination 10/08/2014  . Anxiety state 10/08/2014  . Obesity, Class I, BMI 30-34.9   . Mild intermittent asthma in adult without complication   . Seasonal allergies   . HTN (hypertension)   . Scoliosis    . Osteoporosis    Past Medical History:  Diagnosis Date  . Anxiety    situational  . BPH (benign prostatic hyperplasia)    on flomax  . Bundle branch block    per prior PCP records  . CHF (congestive heart failure) ([facility])   . COPD (chronic obstructive pulmonary disease) ([facility])   . Diverticulosis    by colonoscopy  . Eczema    per prior PCP records  . Elevated PSA 2015   peaked 6s, s/p benign biopsy 2014, sees urology [name] ([place])  . Essential tremor   . GERD (gastroesophageal reflux disease)    per prior PCP records  . History of panic attacks    per prior PCP records  . HTN (hypertension)   . Mild intermittent asthma in adult without complication   . Obesity, Class I, BMI 30-34.9   . Osteoporosis    DEXA 06/2013 with osteopenia - h/o ?wrist/hip fracture from AVN from chronic steroid use (asthma), took reclast for 1 year  . Renal artery stenosis in 1 of 2 vessels ([facility]) 2011   by [place]  . Seasonal allergies   . Sleep apnea   . Thoracic scoliosis childhood  . Transaminitis    per prior PCP records    Family History  Problem Relation Age of Onset  . Cancer Father 38       colon  . Prostate cancer Father   . Diabetes Mother   . Alcohol abuse Mother   . Stroke Mother   . Cancer Maternal Grandmother        pancreatic  . Ataxia Brother   . Ataxia Sister   . CAD Neg Hx     Past Surgical History:  Procedure Laterality Date  . APPLICATION OF WOUND VAC Left 05/09/2018   Procedure: Application Of Wound Vac;  Surgeon: Newt Minion, MD;  Location: [facility];  Service: Orthopedics;  Laterality: Left;  . COLONOSCOPY  12/2013   polyps, int hem, diverticulosis, rpt 5 yrs ([place])  . ELBOW SURGERY Right 2008   golfer's elbow  . INGUINAL HERNIA REPAIR Bilateral childhood & ~1995  . NASAL SEPTUM SURGERY  2013   deviated septum  . ORIF ANKLE FRACTURE Left 05/09/2018   Procedure: OPEN REDUCTION INTERNAL FIXATION LEFT LISFRANC FRACTURE/DISLOCATION;  Surgeon: Newt Minion, MD;   Location: [facility];  Service: Orthopedics;  Laterality: Left;  . US ECHOCARDIOGRAPHY  02/2009   WNL, EF >55% [facility])  . US RENAL/AORTA Right 05/2009   1-59% diameter reduction renal artery, rec rpt 2 yrs   Social History   Occupational History  . Not on file  Tobacco Use  . Smoking status: Never Smoker  . Smokeless tobacco: Never Used  Substance and Sexual Activity  .  Alcohol use: Yes    Alcohol/week: 14.0 standard drinks    Types: 14 Glasses of wine per week    Comment: Social  . Drug use: No  . Sexual activity: Yes    Birth control/protection: None

## 2018-05-15 ENCOUNTER — Ambulatory Visit (INDEPENDENT_AMBULATORY_CARE_PROVIDER_SITE_OTHER): Payer: 59 | Admitting: Orthopedic Surgery

## 2018-05-15 ENCOUNTER — Encounter: Payer: Self-pay | Admitting: Orthopedic Surgery

## 2018-05-15 ENCOUNTER — Other Ambulatory Visit: Payer: Self-pay

## 2018-05-15 VITALS — Ht 71.0 in | Wt 220.0 lb

## 2018-05-15 DIAGNOSIS — B351 Tinea unguium: Secondary | ICD-10-CM

## 2018-05-15 DIAGNOSIS — S93325A Dislocation of tarsometatarsal joint of left foot, initial encounter: Secondary | ICD-10-CM

## 2018-05-19 ENCOUNTER — Encounter: Payer: Self-pay | Admitting: Orthopedic Surgery

## 2018-05-19 DIAGNOSIS — B351 Tinea unguium: Secondary | ICD-10-CM | POA: Insufficient documentation

## 2018-05-19 NOTE — Progress Notes (Signed)
Office Visit Note   Patient: Louis [name]           Date of Birth: [birth year]           MRN: [id number] Visit Date: 05/15/2018              Requested by: Ria Bush, MD [address] [place], [place] [address] PCP: Ria Bush, MD  Chief Complaint  Patient presents with   Left Foot - Routine Post Op    05/09/2018 ORIF Lisfranc fx dislocation       HPI: Patient is a 63 year old gentleman who presents 9 days status post open reduction internal fixation Lisfranc fracture dislocation.  Patient was in a wound VAC and a fracture boot.  Assessment & Plan: Visit Diagnoses:  1. Lisfranc dislocation, left, initial encounter   2. Onychomycosis     Plan: The wound VAC is removed patient will continue with the fracture boot nonweightbearing he is given a prescription for compression stocking postoperative shoe.  3 view radiographs of the left foot follow-up.  Follow-Up Instructions: Return in about 1 week (around 05/22/2018).   Ortho Exam  Patient is alert, oriented, no adenopathy, well-dressed, normal affect, normal respiratory effort. Examination the surgical incision is well approximated there is some swelling but no wound dehiscence no cellulitis no drainage.  Imaging: No results found. No images are attached to the encounter.  Labs: Lab Results  Component Value Date   REPTSTATUS 07/07/2017 FINAL 07/02/2017   CULT  07/02/2017    NO GROWTH 5 DAYS Performed at [facility], [place] [address], [place], [place] [address]      Lab Results  Component Value Date   ALBUMIN 3.9 07/02/2017   ALBUMIN 4.6 07/30/2016   ALBUMIN 4.5 09/26/2015    Body mass index is 30.68 kg/m.  Orders:  No orders of the defined types were placed in this encounter.  No orders of the defined types were placed in this encounter.    Procedures: No procedures performed  Clinical Data: No additional findings.  ROS:  All other systems negative, except as noted in  the HPI. Review of Systems  Objective: Vital Signs: Ht 5\' 11"  (1.803 m)    Wt 220 lb (99.8 kg)    BMI 30.68 kg/m   Specialty Comments:  No specialty comments available.  PMFS History: Patient Active Problem List   Diagnosis Date Noted   Onychomycosis 05/19/2018   Foot fracture, left, closed, initial encounter 05/09/2018   Lisfranc dislocation, left, initial encounter    Acute bronchitis 02/25/2018   Primary pulmonary hypertension ([facility] [place]) 86/57/8469   Diastolic dysfunction 62/95/2841   OSA (obstructive sleep apnea) 07/08/2017   SOB (shortness of breath) 07/02/2017   Chronic respiratory failure with hypoxia and hypercapnia ([place]) 07/02/2017   Cardiomegaly 07/02/2017   Exposure keratitis 02/17/2017   Pedal edema 12/18/2016   Transaminitis 12/18/2016   Bilateral pes planus 12/18/2016   Psoriasis-eczema overlap condition 07/30/2016   High serum high density lipoprotein (HDL) 09/26/2015   Epistaxis 09/26/2015   Renal artery stenosis in 1 of 2 vessels ([facility])    Essential tremor    Elevated PSA    GERD (gastroesophageal reflux disease)    Health maintenance examination 10/08/2014   Anxiety state 10/08/2014   Obesity, Class I, BMI 30-34.9    Mild intermittent asthma in adult without complication    Seasonal allergies    HTN (hypertension)    Scoliosis    Osteoporosis    Past Medical  History:  Diagnosis Date   Anxiety    situational   BPH (benign prostatic hyperplasia)    on flomax   Bundle branch block    per prior PCP records   CHF (congestive heart failure) ([facility])    COPD (chronic obstructive pulmonary disease) ([facility])    Diverticulosis    by colonoscopy   Eczema    per prior PCP records   Elevated PSA 2015   peaked 6s, s/p benign biopsy 2014, sees urology [name]   Essential tremor    GERD (gastroesophageal reflux disease)    per prior PCP records   History of panic attacks    per prior PCP records   HTN  (hypertension)    Mild intermittent asthma in adult without complication    Obesity, Class I, BMI 30-34.9    Osteoporosis    DEXA 06/2013 with osteopenia - h/o ?wrist/hip fracture from AVN from chronic steroid use (asthma), took reclast for 1 year   Renal artery stenosis in 1 of 2 vessels ([place]) 2011   by [place]   Seasonal allergies    Sleep apnea    Thoracic scoliosis childhood   Transaminitis    per prior PCP records    Family History  Problem Relation Age of Onset   Cancer Father 6       colon   Prostate cancer Father    Diabetes Mother    Alcohol abuse Mother    Stroke Mother    Cancer Maternal Grandmother        pancreatic   Ataxia Brother    Ataxia Sister    CAD Neg Hx     Past Surgical History:  Procedure Laterality Date   APPLICATION OF WOUND VAC Left 05/09/2018   Procedure: Application Of Wound Vac;  Surgeon: Newt Minion, MD;  Location: [facility];  Service: Orthopedics;  Laterality: Left;   COLONOSCOPY  12/2013   polyps, int hem, diverticulosis, rpt 5 yrs [name])   ELBOW SURGERY Right 2008   golfer's elbow   INGUINAL HERNIA REPAIR Bilateral childhood & ~1995   NASAL SEPTUM SURGERY  2013   deviated septum   ORIF ANKLE FRACTURE Left 05/09/2018   Procedure: OPEN REDUCTION INTERNAL FIXATION LEFT LISFRANC FRACTURE/DISLOCATION;  Surgeon: Newt Minion, MD;  Location: [facility];  Service: Orthopedics;  Laterality: Left;   US ECHOCARDIOGRAPHY  02/2009   WNL, EF >55% Claiborne Billings)   US RENAL/AORTA Right 05/2009   1-59% diameter reduction renal artery, rec rpt 2 yrs   Social History   Occupational History   Not on file  Tobacco Use   Smoking status: Never Smoker   Smokeless tobacco: Never Used  Substance and Sexual Activity   Alcohol use: Yes    Alcohol/week: 14.0 standard drinks    Types: 14 Glasses of wine per week    Comment: Social   Drug use: No   Sexual activity: Yes    Birth control/protection: None

## 2018-05-22 ENCOUNTER — Other Ambulatory Visit: Payer: Self-pay

## 2018-05-22 ENCOUNTER — Encounter: Payer: Self-pay | Admitting: Orthopedic Surgery

## 2018-05-22 ENCOUNTER — Ambulatory Visit (INDEPENDENT_AMBULATORY_CARE_PROVIDER_SITE_OTHER): Payer: 59 | Admitting: Orthopedic Surgery

## 2018-05-22 ENCOUNTER — Ambulatory Visit (INDEPENDENT_AMBULATORY_CARE_PROVIDER_SITE_OTHER): Payer: 59

## 2018-05-22 VITALS — Ht 71.0 in | Wt 220.0 lb

## 2018-05-22 DIAGNOSIS — S93325D Dislocation of tarsometatarsal joint of left foot, subsequent encounter: Secondary | ICD-10-CM

## 2018-05-22 DIAGNOSIS — M79672 Pain in left foot: Secondary | ICD-10-CM

## 2018-05-22 NOTE — Progress Notes (Addendum)
Office Visit Note   Patient: Louis [name]           Date of Birth: Mar 19, 1955           MRN: [id number] Visit Date: 05/22/2018              Requested by: Ria Bush, MD [address] [place], [place] [address] PCP: Ria Bush, MD  Chief Complaint  Patient presents with  . Left Foot - Routine Post Op    05/09/2018 left foot lisfranc fx w/dislocation      HPI: The patient is a 63 yo gentleman who is seen for post operative follow up following open reduction internal fixation of a Lisfranc fracture/dislocation on 05/09/2018. He has been using a Vive medical compression sock and washing the foot in the shower. He is non weight bearing in a post op shoe.   Assessment & Plan: Visit Diagnoses:  1. Lisfranc dislocation, left, subsequent encounter   2. Left foot pain     Plan:Continue Vive medical compression sock around the clock and non weight bearing in post op shoe. Will follow up next week for suture removal.   Follow-Up Instructions: Return in about 1 week (around 05/29/2018).   Ortho Exam  Patient is alert, oriented, no adenopathy, well-dressed, normal affect, normal respiratory effort. The left foot incision is intact, and dry and clean. No signs of infection or cellulitis. Moderate edema and sutures under tension.  Good palpable pedal pulses.   Imaging: Xr Foot Complete Left  Result Date: 05/22/2018 Good position and alignment of fractures with internal fixation in place  No images are attached to the encounter.  Labs: Lab Results  Component Value Date   REPTSTATUS 07/07/2017 FINAL 07/02/2017   CULT  07/02/2017    NO GROWTH 5 DAYS Performed at [facility], [place] [address], [place], [place] [address]      Lab Results  Component Value Date   ALBUMIN 3.9 07/02/2017   ALBUMIN 4.6 07/30/2016   ALBUMIN 4.5 09/26/2015    Body mass index is 30.68 kg/m.  Orders:  Orders Placed This Encounter  Procedures  . XR Foot Complete Left   No  orders of the defined types were placed in this encounter.    Procedures: No procedures performed  Clinical Data: No additional findings.  ROS:  All other systems negative, except as noted in the HPI. Review of Systems  Objective: Vital Signs: Ht 5\' 11"  (1.803 m)   Wt 220 lb (99.8 kg)   BMI 30.68 kg/m   Specialty Comments:  No specialty comments available.  PMFS History: Patient Active Problem List   Diagnosis Date Noted  . Onychomycosis 05/19/2018  . Foot fracture, left, closed, initial encounter 05/09/2018  . Lisfranc dislocation, left, initial encounter   . Acute bronchitis 02/25/2018  . Primary pulmonary hypertension ([place]) 07/09/2017  . Diastolic dysfunction 09/98/3382  . OSA (obstructive sleep apnea) 07/08/2017  . SOB (shortness of breath) 07/02/2017  . Chronic respiratory failure with hypoxia and hypercapnia ([place]) 07/02/2017  . Cardiomegaly 07/02/2017  . Exposure keratitis 02/17/2017  . Pedal edema 12/18/2016  . Transaminitis 12/18/2016  . Bilateral pes planus 12/18/2016  . Psoriasis-eczema overlap condition 07/30/2016  . High serum high density lipoprotein (HDL) 09/26/2015  . Epistaxis 09/26/2015  . Renal artery stenosis in 1 of 2 vessels ([place])   . Essential tremor   . Elevated PSA   . GERD (gastroesophageal reflux disease)   . Health maintenance examination 10/08/2014  .  Anxiety state 10/08/2014  . Obesity, Class I, BMI 30-34.9   . Mild intermittent asthma in adult without complication   . Seasonal allergies   . HTN (hypertension)   . Scoliosis   . Osteoporosis    Past Medical History:  Diagnosis Date  . Anxiety    situational  . BPH (benign prostatic hyperplasia)    on flomax  . Bundle branch block    per prior PCP records  . CHF (congestive heart failure) ([place])   . COPD (chronic obstructive pulmonary disease) ([place])   . Diverticulosis    by colonoscopy  . Eczema    per prior PCP records  . Elevated PSA 2015   peaked 6s, s/p benign biopsy  2014, sees urology [name] ([place])  . Essential tremor   . GERD (gastroesophageal reflux disease)    per prior PCP records  . History of panic attacks    per prior PCP records  . HTN (hypertension)   . Mild intermittent asthma in adult without complication   . Obesity, Class I, BMI 30-34.9   . Osteoporosis    DEXA 06/2013 with osteopenia - h/o ?wrist/hip fracture from AVN from chronic steroid use (asthma), took reclast for 1 year  . Renal artery stenosis in 1 of 2 vessels ([place]) 2011   by [place]  . Seasonal allergies   . Sleep apnea   . Thoracic scoliosis childhood  . Transaminitis    per prior PCP records    Family History  Problem Relation Age of Onset  . Cancer Father 66       colon  . Prostate cancer Father   . Diabetes Mother   . Alcohol abuse Mother   . Stroke Mother   . Cancer Maternal Grandmother        pancreatic  . Ataxia Brother   . Ataxia Sister   . CAD Neg Hx     Past Surgical History:  Procedure Laterality Date  . APPLICATION OF WOUND VAC Left 05/09/2018   Procedure: Application Of Wound Vac;  Surgeon: Newt Minion, MD;  Location: [facility];  Service: Orthopedics;  Laterality: Left;  . COLONOSCOPY  12/2013   polyps, int hem, diverticulosis, rpt 5 yrs ([place])  . ELBOW SURGERY Right 2008   golfer's elbow  . INGUINAL HERNIA REPAIR Bilateral childhood & ~1995  . NASAL SEPTUM SURGERY  2013   deviated septum  . ORIF ANKLE FRACTURE Left 05/09/2018   Procedure: OPEN REDUCTION INTERNAL FIXATION LEFT LISFRANC FRACTURE/DISLOCATION;  Surgeon: Newt Minion, MD;  Location: [facility];  Service: Orthopedics;  Laterality: Left;  . US ECHOCARDIOGRAPHY  02/2009   WNL, EF >55% Claiborne Billings)  . US RENAL/AORTA Right 05/2009   1-59% diameter reduction renal artery, rec rpt 2 yrs   Social History   Occupational History  . Not on file  Tobacco Use  . Smoking status: Never Smoker  . Smokeless tobacco: Never Used  Substance and Sexual Activity  . Alcohol use: Yes    Alcohol/week: 14.0  standard drinks    Types: 14 Glasses of wine per week    Comment: Social  . Drug use: No  . Sexual activity: Yes    Birth control/protection: None

## 2018-05-27 ENCOUNTER — Ambulatory Visit: Payer: 59 | Admitting: Adult Health

## 2018-05-29 ENCOUNTER — Other Ambulatory Visit: Payer: Self-pay

## 2018-05-29 ENCOUNTER — Encounter: Payer: Self-pay | Admitting: Orthopedic Surgery

## 2018-05-29 ENCOUNTER — Ambulatory Visit (INDEPENDENT_AMBULATORY_CARE_PROVIDER_SITE_OTHER): Payer: 59 | Admitting: Orthopedic Surgery

## 2018-05-29 VITALS — Ht 71.0 in | Wt 220.0 lb

## 2018-05-29 DIAGNOSIS — S93325D Dislocation of tarsometatarsal joint of left foot, subsequent encounter: Secondary | ICD-10-CM

## 2018-05-29 NOTE — Progress Notes (Signed)
Office Visit Note   Patient: Louis [name]           Date of Birth: 02-Jan-1955           MRN: [id number] Visit Date: 05/29/2018              Requested by: Ria Bush, MD [address] [place], [place] [address] PCP: Ria Bush, MD  Chief Complaint  Patient presents with  . Left Foot - Routine Post Op    05/09/2018 left foot ORIF lisfranc fx      HPI: Patient is a 63 year old gentleman presents 3 weeks status post open reduction internal fixation Lisfranc fracture dislocation left foot.  States he has some burning pain along the incision.  Assessment & Plan: Visit Diagnoses:  1. Lisfranc dislocation, left, subsequent encounter     Plan: Patient will continue with the compression sock Rushil soap and water use ointment for the dry skin continue elevation continue nonweightbearing.  Follow-up in 2 weeks with three-view radiographs of the left foot.  Follow-Up Instructions: Return in about 2 weeks (around 06/12/2018).   Ortho Exam  Patient is alert, oriented, no adenopathy, well-dressed, normal affect, normal respiratory effort. Examination patient has swelling in the foot there is some mild ischemic changes along the surgical incision the skin edges are well approximated there is no wound dehiscence no drainage no signs of infection.  We will harvest the sutures today.  Imaging: No results found. No images are attached to the encounter.  Labs: Lab Results  Component Value Date   REPTSTATUS 07/07/2017 FINAL 07/02/2017   CULT  07/02/2017    NO GROWTH 5 DAYS Performed at [facility], [place] [address], [place],  [address]      Lab Results  Component Value Date   ALBUMIN 3.9 07/02/2017   ALBUMIN 4.6 07/30/2016   ALBUMIN 4.5 09/26/2015    Body mass index is 30.68 kg/m.  Orders:  No orders of the defined types were placed in this encounter.  No orders of the defined types were placed in this encounter.    Procedures: No  procedures performed  Clinical Data: No additional findings.  ROS:  All other systems negative, except as noted in the HPI. Review of Systems  Objective: Vital Signs: Ht 5\' 11"  (1.803 m)   Wt 220 lb (99.8 kg)   BMI 30.68 kg/m   Specialty Comments:  No specialty comments available.  PMFS History: Patient Active Problem List   Diagnosis Date Noted  . Onychomycosis 05/19/2018  . Foot fracture, left, closed, initial encounter 05/09/2018  . Lisfranc dislocation, left, initial encounter   . Acute bronchitis 02/25/2018  . Primary pulmonary hypertension ([place]) 07/09/2017  . Diastolic dysfunction 82/95/6213  . OSA (obstructive sleep apnea) 07/08/2017  . SOB (shortness of breath) 07/02/2017  . Chronic respiratory failure with hypoxia and hypercapnia ([place]) 07/02/2017  . Cardiomegaly 07/02/2017  . Exposure keratitis 02/17/2017  . Pedal edema 12/18/2016  . Transaminitis 12/18/2016  . Bilateral pes planus 12/18/2016  . Psoriasis-eczema overlap condition 07/30/2016  . High serum high density lipoprotein (HDL) 09/26/2015  . Epistaxis 09/26/2015  . Renal artery stenosis in 1 of 2 vessels ([place])   . Essential tremor   . Elevated PSA   . GERD (gastroesophageal reflux disease)   . Health maintenance examination 10/08/2014  . Anxiety state 10/08/2014  . Obesity, Class I, BMI 30-34.9   . Mild intermittent asthma in adult without complication   . Seasonal allergies   .  HTN (hypertension)   . Scoliosis   . Osteoporosis    Past Medical History:  Diagnosis Date  . Anxiety    situational  . BPH (benign prostatic hyperplasia)    on flomax  . Bundle branch block    per prior PCP records  . CHF (congestive heart failure) ([place])   . COPD (chronic obstructive pulmonary disease) ([place])   . Diverticulosis    by colonoscopy  . Eczema    per prior PCP records  . Elevated PSA 2015   peaked 6s, s/p benign biopsy 2014, sees urology [name] ([place])  . Essential tremor   . GERD  (gastroesophageal reflux disease)    per prior PCP records  . History of panic attacks    per prior PCP records  . HTN (hypertension)   . Mild intermittent asthma in adult without complication   . Obesity, Class I, BMI 30-34.9   . Osteoporosis    DEXA 06/2013 with osteopenia - h/o ?wrist/hip fracture from AVN from chronic steroid use (asthma), took reclast for 1 year  . Renal artery stenosis in 1 of 2 vessels ([place]) 2011   by [place]  . Seasonal allergies   . Sleep apnea   . Thoracic scoliosis childhood  . Transaminitis    per prior PCP records    Family History  Problem Relation Age of Onset  . Cancer Father 51       colon  . Prostate cancer Father   . Diabetes Mother   . Alcohol abuse Mother   . Stroke Mother   . Cancer Maternal Grandmother        pancreatic  . Ataxia Brother   . Ataxia Sister   . CAD Neg Hx     Past Surgical History:  Procedure Laterality Date  . APPLICATION OF WOUND VAC Left 05/09/2018   Procedure: Application Of Wound Vac;  Surgeon: Newt Minion, MD;  Location: [facility];  Service: Orthopedics;  Laterality: Left;  . COLONOSCOPY  12/2013   polyps, int hem, diverticulosis, rpt 5 yrs ([place])  . ELBOW SURGERY Right 2008   golfer's elbow  . INGUINAL HERNIA REPAIR Bilateral childhood & ~1995  . NASAL SEPTUM SURGERY  2013   deviated septum  . ORIF ANKLE FRACTURE Left 05/09/2018   Procedure: OPEN REDUCTION INTERNAL FIXATION LEFT LISFRANC FRACTURE/DISLOCATION;  Surgeon: Newt Minion, MD;  Location: [facility];  Service: Orthopedics;  Laterality: Left;  . US ECHOCARDIOGRAPHY  02/2009   WNL, EF >55% Claiborne Billings)  . US RENAL/AORTA Right 05/2009   1-59% diameter reduction renal artery, rec rpt 2 yrs   Social History   Occupational History  . Not on file  Tobacco Use  . Smoking status: Never Smoker  . Smokeless tobacco: Never Used  Substance and Sexual Activity  . Alcohol use: Yes    Alcohol/week: 14.0 standard drinks    Types: 14 Glasses of wine per week    Comment:  Social  . Drug use: No  . Sexual activity: Yes    Birth control/protection: None

## 2018-05-31 ENCOUNTER — Telehealth: Payer: Self-pay | Admitting: *Deleted

## 2018-05-31 NOTE — Telephone Encounter (Signed)
I called pt and made him aware that he may have been potentially exposed to an employee who tested positive for the COVID-19 virus.  I offered him free testing however he wanted to call back to schedule the test.   I gave him  The [phone].

## 2018-05-31 NOTE — Telephone Encounter (Signed)
I called and left a message on his voicemail to call us back regarding his recent visit to [facility].  Calling in regard to potentially being exposed to COVID-19.  Gave number [phone].

## 2018-06-02 ENCOUNTER — Telehealth: Payer: Self-pay | Admitting: *Deleted

## 2018-06-02 ENCOUNTER — Telehealth: Payer: Self-pay | Admitting: Pulmonary Disease

## 2018-06-02 NOTE — Telephone Encounter (Signed)
Pt contacted due to request for more information regarding  potential exposure to COVID-19 during recent visit. See telephone encounter from 05/31/18. Left message for pt to return call.

## 2018-06-02 NOTE — Telephone Encounter (Signed)
Pt contacted office via MyChart message regarding concerns about a personal COVID-19 encounter. 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 He has no identifiable symptoms, but he is wondering what measures he needs to take at this point.   Since [name] is out of the office, routing to [facility], provider of the day.  SG, please advise on your recommendations for this pt. Thank you.

## 2018-06-02 NOTE — Telephone Encounter (Signed)
Spoke with pt, he would like to be tested for Covid (see mychart message below). Please call pt at [phone]. He is questioning if it is too early to test since he has no symptoms at this time. 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 The nurse said [facility] would provide COVID-19 testing for me. I was not provided any additional details by the nurse. I have no identifiable symptoms at this time. I am wondering if I need to self-quarantine for a period of time (how long?) and when it would be appropriate and meaningful for me to be tested. Also, the only person I have been in close contact with since Thursday is my wife, [name]. We are wondering what precautionary steps are recommended for [name] to take at this time.   I look forward to hearing from you and to your guidance.   Thanks!   Louis [name]

## 2018-06-02 NOTE — Telephone Encounter (Signed)
Pt returning call stating that on Saturday he was contacted regarding potential exposure during visit at [facility] on 5/28 and was offered testing for the virus.Pt asking if more details about interaction with the person who later tested positive could be provided. Patient states that he did not want to know the name of the person who was tested positive but wanted to know if he would be told how much contact he had with the person, so that he could do a risk assesment. Pt advised that the specifics of the situation cannot be shared due to health care privacy concerns. Pt verbalized understanding. Pt also asking the amount of time of testing would be offered. Explained to pt that the incubation period for COVID-19 can be up to 14 days and ideally testing would need to be performed within that time period after potential exposure during visit. Pt verbalized understanding and no additional concerns or questions voiced.

## 2018-06-03 NOTE — Telephone Encounter (Signed)
Copied from [facility] message:  Pt returning call stating that on Saturday he was contacted regarding potential exposure during visit at [facility] on 5/28 and was offered testing for the virus.Pt asking if more details about interaction with the person who later tested positive could be provided. Patient states that he did not want to know the name of the person who was tested positive but wanted to know if he would be told how much contact he had with the person, so that he could do a risk assesment. Pt advised that the specifics of the situation cannot be shared due to health care privacy concerns. Pt verbalized understanding. Pt also asking the amount of time of testing would be offered. Explained to pt that the incubation period for COVID-19 can be up to 14 days and ideally testing would need to be performed within that time period after potential exposure during visit. Pt verbalized understanding and no additional concerns or questions voiced.

## 2018-06-12 ENCOUNTER — Other Ambulatory Visit: Payer: Self-pay

## 2018-06-12 ENCOUNTER — Ambulatory Visit (INDEPENDENT_AMBULATORY_CARE_PROVIDER_SITE_OTHER): Payer: 59

## 2018-06-12 ENCOUNTER — Encounter: Payer: Self-pay | Admitting: Orthopedic Surgery

## 2018-06-12 ENCOUNTER — Ambulatory Visit (INDEPENDENT_AMBULATORY_CARE_PROVIDER_SITE_OTHER): Payer: 59 | Admitting: Orthopedic Surgery

## 2018-06-12 VITALS — Ht 71.0 in | Wt 220.0 lb

## 2018-06-12 DIAGNOSIS — S93325D Dislocation of tarsometatarsal joint of left foot, subsequent encounter: Secondary | ICD-10-CM

## 2018-06-12 NOTE — Progress Notes (Signed)
Office Visit Note   Patient: Louis [name]           Date of Birth: [birth year]           MRN: [id number] Visit Date: 06/12/2018              Requested by: Ria Bush, MD [address] [place],  [place] [address] PCP: Ria Bush, MD  Chief Complaint  Patient presents with  . Left Foot - Routine Post Op    05/09/2018 ORIF left foot lisfranc fx      HPI: Patient is 4 weeks status post open reduction internal fixation for Lisfranc fracture dislocation of the left foot.  Assessment & Plan: Visit Diagnoses:  1. Lisfranc dislocation, left, subsequent encounter     Plan: Continue nonweightbearing continue with the compression stocking continue elevation follow-up in the office in 2 weeks with repeat three-view radiographs of the left foot at which time anticipate we can begin full weightbearing.  Follow-Up Instructions: Return in about 2 weeks (around 06/26/2018).   Ortho Exam  Patient is alert, oriented, no adenopathy, well-dressed, normal affect, normal respiratory effort. Examination there is some swelling some slight dehiscence of the wound approximately a millimeter no cellulitis no drainage no signs of infection.  Imaging: Xr Foot Complete Left  Result Date: 06/12/2018 3 view radiographs of the left foot shows stable alignment of the Lisfranc fracture dislocation there is no callus formation yet the joint spaces are congruent no widening of the Lisfranc complex.  No images are attached to the encounter.  Labs: Lab Results  Component Value Date   REPTSTATUS 07/07/2017 FINAL 07/02/2017   CULT  07/02/2017    NO GROWTH 5 DAYS Performed at [facility], [place] [address], [place], [place] [address]      Lab Results  Component Value Date   ALBUMIN 3.9 07/02/2017   ALBUMIN 4.6 07/30/2016   ALBUMIN 4.5 09/26/2015    Body mass index is 30.68 kg/m.  Orders:  Orders Placed This Encounter  Procedures  . XR Foot Complete Left   No orders of  the defined types were placed in this encounter.    Procedures: No procedures performed  Clinical Data: No additional findings.  ROS:  All other systems negative, except as noted in the HPI. Review of Systems  Objective: Vital Signs: Ht 5\' 11"  (1.803 m)   Wt 220 lb (99.8 kg)   BMI 30.68 kg/m   Specialty Comments:  No specialty comments available.  PMFS History: Patient Active Problem List   Diagnosis Date Noted  . Onychomycosis 05/19/2018  . Foot fracture, left, closed, initial encounter 05/09/2018  . Lisfranc dislocation, left, initial encounter   . Acute bronchitis 02/25/2018  . Primary pulmonary hypertension ([place]) 07/09/2017  . Diastolic dysfunction 42/68/3419  . OSA (obstructive sleep apnea) 07/08/2017  . SOB (shortness of breath) 07/02/2017  . Chronic respiratory failure with hypoxia and hypercapnia ([place]) 07/02/2017  . Cardiomegaly 07/02/2017  . Exposure keratitis 02/17/2017  . Pedal edema 12/18/2016  . Transaminitis 12/18/2016  . Bilateral pes planus 12/18/2016  . Psoriasis-eczema overlap condition 07/30/2016  . High serum high density lipoprotein (HDL) 09/26/2015  . Epistaxis 09/26/2015  . Renal artery stenosis in 1 of 2 vessels ([place])   . Essential tremor   . Elevated PSA   . GERD (gastroesophageal reflux disease)   . Health maintenance examination 10/08/2014  . Anxiety state 10/08/2014  . Obesity, Class I, BMI 30-34.9   . Mild intermittent  asthma in adult without complication   . Seasonal allergies   . HTN (hypertension)   . Scoliosis   . Osteoporosis    Past Medical History:  Diagnosis Date  . Anxiety    situational  . BPH (benign prostatic hyperplasia)    on flomax  . Bundle branch block    per prior PCP records  . CHF (congestive heart failure) ([place])   . COPD (chronic obstructive pulmonary disease) ([place])   . Diverticulosis    by colonoscopy  . Eczema    per prior PCP records  . Elevated PSA 2015   peaked 6s, s/p benign biopsy 2014,  sees urology [name] ([place])  . Essential tremor   . GERD (gastroesophageal reflux disease)    per prior PCP records  . History of panic attacks    per prior PCP records  . HTN (hypertension)   . Mild intermittent asthma in adult without complication   . Obesity, Class I, BMI 30-34.9   . Osteoporosis    DEXA 06/2013 with osteopenia - h/o ?wrist/hip fracture from AVN from chronic steroid use (asthma), took reclast for 1 year  . Renal artery stenosis in 1 of 2 vessels ([place]) 2011   by [place]  . Seasonal allergies   . Sleep apnea   . Thoracic scoliosis childhood  . Transaminitis    per prior PCP records    Family History  Problem Relation Age of Onset  . Cancer Father 5       colon  . Prostate cancer Father   . Diabetes Mother   . Alcohol abuse Mother   . Stroke Mother   . Cancer Maternal Grandmother        pancreatic  . Ataxia Brother   . Ataxia Sister   . CAD Neg Hx     Past Surgical History:  Procedure Laterality Date  . APPLICATION OF WOUND VAC Left 05/09/2018   Procedure: Application Of Wound Vac;  Surgeon: Newt Minion, MD;  Location: [facility];  Service: Orthopedics;  Laterality: Left;  . COLONOSCOPY  12/2013   polyps, int hem, diverticulosis, rpt 5 yrs ([place])  . ELBOW SURGERY Right 2008   golfer's elbow  . INGUINAL HERNIA REPAIR Bilateral childhood & ~1995  . NASAL SEPTUM SURGERY  2013   deviated septum  . ORIF ANKLE FRACTURE Left 05/09/2018   Procedure: OPEN REDUCTION INTERNAL FIXATION LEFT LISFRANC FRACTURE/DISLOCATION;  Surgeon: Newt Minion, MD;  Location: [facility];  Service: Orthopedics;  Laterality: Left;  . US ECHOCARDIOGRAPHY  02/2009   WNL, EF >55% Claiborne Billings)  . US RENAL/AORTA Right 05/2009   1-59% diameter reduction renal artery, rec rpt 2 yrs   Social History   Occupational History  . Not on file  Tobacco Use  . Smoking status: Never Smoker  . Smokeless tobacco: Never Used  Substance and Sexual Activity  . Alcohol use: Yes    Alcohol/week: 14.0 standard  drinks    Types: 14 Glasses of wine per week    Comment: Social  . Drug use: No  . Sexual activity: Yes    Birth control/protection: None

## 2018-06-16 NOTE — Telephone Encounter (Signed)
Yes, that is fine. Please provide note

## 2018-06-16 NOTE — Telephone Encounter (Signed)
Letter created to confirm chronic lung condition and high risk for covid.  Mailed to patient's home address.

## 2018-06-16 NOTE — Telephone Encounter (Signed)
Patient send message as follows:   "Following my pulmonary rehab at [facility], I signed up for a gym membership at [employer] and paid in advance for 72 personal training sessions. My intention was to continue my rehab activities. The gym was subsequently required to close with the onset of the [place] pandemic. I realized that even if it reopened, I would not be comfortable going back to the gym environment given the unknowns about the pandemic and my cardiopulmonary issues. The gym has agreed to refund my money, but has requested a statement from my doctor documenting that it is not advisable for me to return to a public gym environment at this time given [place] and my medical condition. Is that something you could provide?"  Routed to B. Volanda Napoleon, NP (app of the day) for review.  Beth could we type up something for him and have you sign?  ( diagnosis:  Chronic respiratory failure/asthma)  Example:   This letter is written in reference to Tilman Neat who has been diagnosed with a chronic lung condition.  This condition does place Mr. Caughlin in the high risk category for potential severe illness in regard to an exposure to the COVID-19 virus.    Please advise. Thank you

## 2018-06-19 ENCOUNTER — Telehealth: Payer: Self-pay | Admitting: *Deleted

## 2018-06-19 NOTE — Telephone Encounter (Signed)
[id number] /wished him well and blessings given on the death of his mother.  Doing well planning funeral arrangements, has good family support. Rhonda Davis,BSN,RN3,CCM,CN.

## 2018-06-30 ENCOUNTER — Ambulatory Visit (INDEPENDENT_AMBULATORY_CARE_PROVIDER_SITE_OTHER): Payer: 59

## 2018-06-30 ENCOUNTER — Ambulatory Visit (INDEPENDENT_AMBULATORY_CARE_PROVIDER_SITE_OTHER): Payer: 59 | Admitting: Orthopedic Surgery

## 2018-06-30 ENCOUNTER — Other Ambulatory Visit: Payer: Self-pay

## 2018-06-30 ENCOUNTER — Encounter: Payer: Self-pay | Admitting: Orthopedic Surgery

## 2018-06-30 ENCOUNTER — Ambulatory Visit: Payer: 59 | Admitting: Orthopedic Surgery

## 2018-06-30 VITALS — Ht 71.0 in | Wt 220.0 lb

## 2018-06-30 DIAGNOSIS — S93325D Dislocation of tarsometatarsal joint of left foot, subsequent encounter: Secondary | ICD-10-CM | POA: Diagnosis not present

## 2018-06-30 NOTE — Progress Notes (Signed)
Office Visit Note   Patient: Louis [name]           Date of Birth: 21-Apr-1955           MRN: [id number] Visit Date: 06/30/2018              Requested by: Louis Bush, MD [address] [place],  [place] [address] PCP: Louis Bush, MD  Chief Complaint  Patient presents with  . Left Foot - Routine Post Op    05/09/18 ORIF Left Lisfranc Fx      HPI: Patient is a 63 year old gentleman who presents 7 weeks status post open reduction internal fixation Lisfranc fracture dislocation.  Patient states he still has some swelling and numbness.  He has been wearing compression stockings and elevating his foot.  Patient has a history of taking bisphosphonates for osteoporosis.  Assessment & Plan: Visit Diagnoses:  1. Lisfranc dislocation, left, subsequent encounter     Plan: Recommend to continue with vitamin D3 and calcium supplements.  The delayed healing may be due to the history of bisphosphonate use.  Continue nonweightbearing and follow-up in 3 weeks with repeat three-view radiographs of the left foot.  Follow-Up Instructions: Return in about 3 weeks (around 07/21/2018).   Ortho Exam  Patient is alert, oriented, no adenopathy, well-dressed, normal affect, normal respiratory effort. Examination patient does have some swelling the incision is well-healed he is tender to palpation across the entire midfoot.  There is no cellulitis no drainage no signs of infection.  Imaging: Xr Foot Complete Left  Result Date: 06/30/2018 3 view radiographs of the left foot shows no hardware failure there is no callus formation across the fracture sites.  No images are attached to the encounter.  Labs: Lab Results  Component Value Date   REPTSTATUS 07/07/2017 FINAL 07/02/2017   CULT  07/02/2017    NO GROWTH 5 DAYS Performed at [facility], [place] [address], [place], [place] [address]      Lab Results  Component Value Date   ALBUMIN 3.9 07/02/2017   ALBUMIN 4.6  07/30/2016   ALBUMIN 4.5 09/26/2015    Body mass index is 30.68 kg/m.  Orders:  Orders Placed This Encounter  Procedures  . XR Foot Complete Left   No orders of the defined types were placed in this encounter.    Procedures: No procedures performed  Clinical Data: No additional findings.  ROS:  All other systems negative, except as noted in the HPI. Review of Systems  Objective: Vital Signs: Ht 5\' 11"  (1.803 m)   Wt 220 lb (99.8 kg)   BMI 30.68 kg/m   Specialty Comments:  No specialty comments available.  PMFS History: Patient Active Problem List   Diagnosis Date Noted  . Onychomycosis 05/19/2018  . Foot fracture, left, closed, initial encounter 05/09/2018  . Lisfranc dislocation, left, initial encounter   . Acute bronchitis 02/25/2018  . Primary pulmonary hypertension ([place]) 07/09/2017  . Diastolic dysfunction 69/48/5462  . OSA (obstructive sleep apnea) 07/08/2017  . SOB (shortness of breath) 07/02/2017  . Chronic respiratory failure with hypoxia and hypercapnia ([place]) 07/02/2017  . Cardiomegaly 07/02/2017  . Exposure keratitis 02/17/2017  . Pedal edema 12/18/2016  . Transaminitis 12/18/2016  . Bilateral pes planus 12/18/2016  . Psoriasis-eczema overlap condition 07/30/2016  . High serum high density lipoprotein (HDL) 09/26/2015  . Epistaxis 09/26/2015  . Renal artery stenosis in 1 of 2 vessels ([place])   . Essential tremor   . Elevated PSA   .  GERD (gastroesophageal reflux disease)   . Health maintenance examination 10/08/2014  . Anxiety state 10/08/2014  . Obesity, Class I, BMI 30-34.9   . Mild intermittent asthma in adult without complication   . Seasonal allergies   . HTN (hypertension)   . Scoliosis   . Osteoporosis    Past Medical History:  Diagnosis Date  . Anxiety    situational  . BPH (benign prostatic hyperplasia)    on flomax  . Bundle branch block    per prior PCP records  . CHF (congestive heart failure) ([place])   . COPD (chronic  obstructive pulmonary disease) ([place])   . Diverticulosis    by colonoscopy  . Eczema    per prior PCP records  . Elevated PSA 2015   peaked 6s, s/p benign biopsy 2014, sees urology [name] ([place])  . Essential tremor   . GERD (gastroesophageal reflux disease)    per prior PCP records  . History of panic attacks    per prior PCP records  . HTN (hypertension)   . Mild intermittent asthma in adult without complication   . Obesity, Class I, BMI 30-34.9   . Osteoporosis    DEXA 06/2013 with osteopenia - h/o ?wrist/hip fracture from AVN from chronic steroid use (asthma), took reclast for 1 year  . Renal artery stenosis in 1 of 2 vessels ([place]) 2011   by [place]  . Seasonal allergies   . Sleep apnea   . Thoracic scoliosis childhood  . Transaminitis    per prior PCP records    Family History  Problem Relation Age of Onset  . Cancer Father 64       colon  . Prostate cancer Father   . Diabetes Mother   . Alcohol abuse Mother   . Stroke Mother   . Cancer Maternal Grandmother        pancreatic  . Ataxia Brother   . Ataxia Sister   . CAD Neg Hx     Past Surgical History:  Procedure Laterality Date  . APPLICATION OF WOUND VAC Left 05/09/2018   Procedure: Application Of Wound Vac;  Surgeon: Newt Minion, MD;  Location: [facility];  Service: Orthopedics;  Laterality: Left;  . COLONOSCOPY  12/2013   polyps, int hem, diverticulosis, rpt 5 yrs ([place])  . ELBOW SURGERY Right 2008   golfer's elbow  . INGUINAL HERNIA REPAIR Bilateral childhood & ~1995  . NASAL SEPTUM SURGERY  2013   deviated septum  . ORIF ANKLE FRACTURE Left 05/09/2018   Procedure: OPEN REDUCTION INTERNAL FIXATION LEFT LISFRANC FRACTURE/DISLOCATION;  Surgeon: Newt Minion, MD;  Location: [facility];  Service: Orthopedics;  Laterality: Left;  . US ECHOCARDIOGRAPHY  02/2009   WNL, EF >55% [facility]ib[facility] [place])  . US RENAL/AORTA Right 05/2009   1-59% diameter reduction renal artery, rec rpt 2 yrs   Social History   Occupational History  . Not  on file  Tobacco Use  . Smoking status: Never Smoker  . Smokeless tobacco: Never Used  Substance and Sexual Activity  . Alcohol use: Yes    Alcohol/week: 14.0 standard drinks    Types: 14 Glasses of wine per week    Comment: Social  . Drug use: No  . Sexual activity: Yes    Birth control/protection: None

## 2018-07-07 ENCOUNTER — Encounter: Payer: Self-pay | Admitting: Orthopedic Surgery

## 2018-07-07 ENCOUNTER — Other Ambulatory Visit: Payer: Self-pay | Admitting: Orthopedic Surgery

## 2018-07-07 MED ORDER — GABAPENTIN 300 MG PO CAPS: 300.0000 mg | ORAL_CAPSULE | Freq: Three times a day (TID) | ORAL | 3 refills | Status: DC

## 2018-07-21 ENCOUNTER — Ambulatory Visit (INDEPENDENT_AMBULATORY_CARE_PROVIDER_SITE_OTHER): Payer: 59

## 2018-07-21 ENCOUNTER — Other Ambulatory Visit: Payer: Self-pay

## 2018-07-21 ENCOUNTER — Ambulatory Visit (INDEPENDENT_AMBULATORY_CARE_PROVIDER_SITE_OTHER): Payer: 59 | Admitting: Orthopedic Surgery

## 2018-07-21 ENCOUNTER — Encounter: Payer: Self-pay | Admitting: Orthopedic Surgery

## 2018-07-21 VITALS — Ht 71.0 in | Wt 220.0 lb

## 2018-07-21 DIAGNOSIS — S93325D Dislocation of tarsometatarsal joint of left foot, subsequent encounter: Secondary | ICD-10-CM | POA: Diagnosis not present

## 2018-07-27 ENCOUNTER — Encounter: Payer: Self-pay | Admitting: Orthopedic Surgery

## 2018-07-27 NOTE — Progress Notes (Signed)
Office Visit Note   Patient: Louis [name]           Date of Birth: 16-Jun-1955           MRN: [id number] Visit Date: 07/21/2018              Requested by: Ria Bush, MD [address] [place],  [place] [address] PCP: Ria Bush, MD  Chief Complaint  Patient presents with  . Left Foot - Routine Post Op    05/09/18 ORIF Lisfranc fx       HPI: Patient is a 63 year old gentleman who presents 10 weeks status post open reduction internal fixation Lisfranc fracture dislocation.  Patient has been nonweightbearing in a postoperative shoe wearing compression stocking states that his entire foot feels numb.  He has been working on range of motion of the ankle.  Assessment & Plan: Visit Diagnoses:  1. Lisfranc dislocation, left, subsequent encounter     Plan: We will have him increase the Neurontin to 300 mg 3 times a day from at night.  His symptoms seem to be more from the neuropathy.  Continue with protected weightbearing recommended a stiff soled shoe for partial weightbearing.  Follow-Up Instructions: Return in about 2 weeks (around 08/04/2018).   Ortho Exam  Patient is alert, oriented, no adenopathy, well-dressed, normal affect, normal respiratory effort. Examination patient has no redness no cellulitis no signs of infection.  Radiographs do show gaping of the fracture of the second metatarsal.  There is good cortical apposition of the lateral aspect of the second metatarsal fixation.  Imaging: No results found. No images are attached to the encounter.  Labs: Lab Results  Component Value Date   REPTSTATUS 07/07/2017 FINAL 07/02/2017   CULT  07/02/2017    NO GROWTH 5 DAYS Performed at [facility], [place] [address] [place], [place] [address]      Lab Results  Component Value Date   ALBUMIN 3.9 07/02/2017   ALBUMIN 4.6 07/30/2016   ALBUMIN 4.5 09/26/2015    No results found for: MG Lab Results  Component Value Date   VD25OH 35.12 09/26/2015   VD25OH 67.1 10/11/2014   VD25OH 67.1 06/16/2013    No results found for: PREALBUMIN CBC EXTENDED Latest Ref Rng & Units 07/04/2017 07/03/2017 07/02/2017  WBC 4.0 - 10.5 K/uL 7.2 5.5 6.5  RBC 4.22 - 5.81 MIL/uL 5.02 4.88 5.22  HGB 13.0 - 17.0 g/dL 16.5 16.2 17.3(H)  HCT 39.0 - 52.0 % 52.6(H) 52.1(H) 54.8(H)  PLT 150 - 400 K/uL 237 232 222  NEUTROABS 1.7 - 7.7 K/uL - - 4.2  LYMPHSABS 0.7 - 4.0 K/uL - - 1.2     Body mass index is 30.68 kg/m.  Orders:  Orders Placed This Encounter  Procedures  . XR Foot Complete Left   No orders of the defined types were placed in this encounter.    Procedures: No procedures performed  Clinical Data: No additional findings.  ROS:  All other systems negative, except as noted in the HPI. Review of Systems  Objective: Vital Signs: Ht 5\' 11"  (1.803 m)   Wt 220 lb (99.8 kg)   BMI 30.68 kg/m   Specialty Comments:  No specialty comments available.  PMFS History: Patient Active Problem List   Diagnosis Date Noted  . Onychomycosis 05/19/2018  . Foot fracture, left, closed, initial encounter 05/09/2018  . Lisfranc dislocation, left, initial encounter   . Acute bronchitis 02/25/2018  . Primary pulmonary hypertension ([place]) 07/09/2017  .  Diastolic dysfunction 16/10/9602  . OSA (obstructive sleep apnea) 07/08/2017  . SOB (shortness of breath) 07/02/2017  . Chronic respiratory failure with hypoxia and hypercapnia ([place]) 07/02/2017  . Cardiomegaly 07/02/2017  . Exposure keratitis 02/17/2017  . Pedal edema 12/18/2016  . Transaminitis 12/18/2016  . Bilateral pes planus 12/18/2016  . Psoriasis-eczema overlap condition 07/30/2016  . High serum high density lipoprotein (HDL) 09/26/2015  . Epistaxis 09/26/2015  . Renal artery stenosis in 1 of 2 vessels ([place])   . Essential tremor   . Elevated PSA   . GERD (gastroesophageal reflux disease)   . Health maintenance examination 10/08/2014  . Anxiety state 10/08/2014  . Obesity, Class I, BMI 30-34.9    . Mild intermittent asthma in adult without complication   . Seasonal allergies   . HTN (hypertension)   . Scoliosis   . Osteoporosis    Past Medical History:  Diagnosis Date  . Anxiety    situational  . BPH (benign prostatic hyperplasia)    on flomax  . Bundle branch block    per prior PCP records  . CHF (congestive heart failure) ([place])   . COPD (chronic obstructive pulmonary disease) ([place])   . Diverticulosis    by colonoscopy  . Eczema    per prior PCP records  . Elevated PSA 2015   peaked 6s, s/p benign biopsy 2014, sees urology [name] ([place])  . Essential tremor   . GERD (gastroesophageal reflux disease)    per prior PCP records  . History of panic attacks    per prior PCP records  . HTN (hypertension)   . Mild intermittent asthma in adult without complication   . Obesity, Class I, BMI 30-34.9   . Osteoporosis    DEXA 06/2013 with osteopenia - h/o ?wrist/hip fracture from AVN from chronic steroid use (asthma), took reclast for 1 year  . Renal artery stenosis in 1 of 2 vessels ([place]) 2011   by [place]  . Seasonal allergies   . Sleep apnea   . Thoracic scoliosis childhood  . Transaminitis    per prior PCP records    Family History  Problem Relation Age of Onset  . Cancer Father 64       colon  . Prostate cancer Father   . Diabetes Mother   . Alcohol abuse Mother   . Stroke Mother   . Cancer Maternal Grandmother        pancreatic  . Ataxia Brother   . Ataxia Sister   . CAD Neg Hx     Past Surgical History:  Procedure Laterality Date  . APPLICATION OF WOUND VAC Left 05/09/2018   Procedure: Application Of Wound Vac;  Surgeon: Newt Minion, MD;  Location: [facility];  Service: Orthopedics;  Laterality: Left;  . COLONOSCOPY  12/2013   polyps, int hem, diverticulosis, rpt 5 yrs ([place])  . ELBOW SURGERY Right 2008   golfer's elbow  . INGUINAL HERNIA REPAIR Bilateral childhood & ~1995  . NASAL SEPTUM SURGERY  2013   deviated septum  . ORIF ANKLE FRACTURE Left  05/09/2018   Procedure: OPEN REDUCTION INTERNAL FIXATION LEFT LISFRANC FRACTURE/DISLOCATION;  Surgeon: Newt Minion, MD;  Location: [facility];  Service: Orthopedics;  Laterality: Left;  . US ECHOCARDIOGRAPHY  02/2009   WNL, EF >55% Claiborne Billings)  . US RENAL/AORTA Right 05/2009   1-59% diameter reduction renal artery, rec rpt 2 yrs   Social History   Occupational History  . Not on file  Tobacco Use  .  Smoking status: Never Smoker  . Smokeless tobacco: Never Used  Substance and Sexual Activity  . Alcohol use: Yes    Alcohol/week: 14.0 standard drinks    Types: 14 Glasses of wine per week    Comment: Social  . Drug use: No  . Sexual activity: Yes    Birth control/protection: None

## 2018-08-05 ENCOUNTER — Ambulatory Visit (INDEPENDENT_AMBULATORY_CARE_PROVIDER_SITE_OTHER): Payer: 59 | Admitting: Orthopedic Surgery

## 2018-08-05 ENCOUNTER — Ambulatory Visit (INDEPENDENT_AMBULATORY_CARE_PROVIDER_SITE_OTHER): Payer: 59

## 2018-08-05 ENCOUNTER — Encounter: Payer: Self-pay | Admitting: Orthopedic Surgery

## 2018-08-05 VITALS — Ht 71.0 in | Wt 220.0 lb

## 2018-08-05 DIAGNOSIS — S93325D Dislocation of tarsometatarsal joint of left foot, subsequent encounter: Secondary | ICD-10-CM

## 2018-08-05 DIAGNOSIS — M79672 Pain in left foot: Secondary | ICD-10-CM

## 2018-08-06 ENCOUNTER — Encounter: Payer: Self-pay | Admitting: Orthopedic Surgery

## 2018-08-06 NOTE — Progress Notes (Signed)
Office Visit Note   Patient: Louis [name]           Date of Birth: [birth year]           MRN: [id number] Visit Date: 08/05/2018              Requested by: Ria Bush, MD [address] [place],  [place] [address] PCP: Ria Bush, MD  Chief Complaint  Patient presents with  . Left Foot - Routine Post Op    05/09/2018 ORIF Left Lisfranc      HPI: Patient is a 63 year old gentleman who presents approximately 3 months status post open reduction internal fixation for Lisfranc fracture dislocation of the left.  Patient is currently wearing sneakers with orthotics and a carbon plate.  He takes Neurontin for his neuropathy he is wearing compression stockings states he still has some swelling.  He is weightbearing as tolerated with a walker.  Assessment & Plan: Visit Diagnoses:  1. Pain in left foot   2. Lisfranc dislocation, left, subsequent encounter     Plan: Recommended that he advance to either a cane or other assistive device that he can discontinue the walker continue with the carbon plate orthotics and athletic shoe.  If patient develops symptoms he will call in follow-up and we will repeat radiographs.  Discussed that there is persistent lucency on the medial aspect of the second metatarsal but his foot clinically is stable.  Follow-Up Instructions: Return if symptoms worsen or fail to improve.   Ortho Exam  Patient is alert, oriented, no adenopathy, well-dressed, normal affect, normal respiratory effort. Examination patient still has some swelling there is no redness no cellulitis the incision is well-healed.  Patient is asymptomatic with full weightbearing on his foot.  He has a good carbon fiber plate to minimize stress through the midfoot.  Imaging: No results found. No images are attached to the encounter.  Labs: Lab Results  Component Value Date   REPTSTATUS 07/07/2017 FINAL 07/02/2017   CULT  07/02/2017    NO GROWTH 5 DAYS Performed at [facility], [place] [address], [place],  [address]      Lab Results  Component Value Date   ALBUMIN 3.9 07/02/2017   ALBUMIN 4.6 07/30/2016   ALBUMIN 4.5 09/26/2015    No results found for: MG Lab Results  Component Value Date   VD25OH 35.12 09/26/2015   VD25OH 67.1 10/11/2014   VD25OH 67.1 06/16/2013    No results found for: PREALBUMIN CBC EXTENDED Latest Ref Rng & Units 07/04/2017 07/03/2017 07/02/2017  WBC 4.0 - 10.5 K/uL 7.2 5.5 6.5  RBC 4.22 - 5.81 MIL/uL 5.02 4.88 5.22  HGB 13.0 - 17.0 g/dL 16.5 16.2 17.3(H)  HCT 39.0 - 52.0 % 52.6(H) 52.1(H) 54.8(H)  PLT 150 - 400 K/uL 237 232 222  NEUTROABS 1.7 - 7.7 K/uL - - 4.2  LYMPHSABS 0.7 - 4.0 K/uL - - 1.2     Body mass index is 30.68 kg/m.  Orders:  Orders Placed This Encounter  Procedures  . XR Foot Complete Left   No orders of the defined types were placed in this encounter.    Procedures: No procedures performed  Clinical Data: No additional findings.  ROS:  All other systems negative, except as noted in the HPI. Review of Systems  Objective: Vital Signs: Ht 5\' 11"  (1.803 m)   Wt 220 lb (99.8 kg)   BMI 30.68 kg/m   Specialty Comments:  No specialty comments  available.  PMFS History: Patient Active Problem List   Diagnosis Date Noted  . Onychomycosis 05/19/2018  . Foot fracture, left, closed, initial encounter 05/09/2018  . Lisfranc dislocation, left, initial encounter   . Acute bronchitis 02/25/2018  . Primary pulmonary hypertension ([place]) 07/09/2017  . Diastolic dysfunction 58/30/9407  . OSA (obstructive sleep apnea) 07/08/2017  . SOB (shortness of breath) 07/02/2017  . Chronic respiratory failure with hypoxia and hypercapnia ([place]) 07/02/2017  . Cardiomegaly 07/02/2017  . Exposure keratitis 02/17/2017  . Pedal edema 12/18/2016  . Transaminitis 12/18/2016  . Bilateral pes planus 12/18/2016  . Psoriasis-eczema overlap condition 07/30/2016  . High serum high density lipoprotein (HDL)  09/26/2015  . Epistaxis 09/26/2015  . Renal artery stenosis in 1 of 2 vessels ([place])   . Essential tremor   . Elevated PSA   . GERD (gastroesophageal reflux disease)   . Health maintenance examination 10/08/2014  . Anxiety state 10/08/2014  . Obesity, Class I, BMI 30-34.9   . Mild intermittent asthma in adult without complication   . Seasonal allergies   . HTN (hypertension)   . Scoliosis   . Osteoporosis    Past Medical History:  Diagnosis Date  . Anxiety    situational  . BPH (benign prostatic hyperplasia)    on flomax  . Bundle branch block    per prior PCP records  . CHF (congestive heart failure) ([place])   . COPD (chronic obstructive pulmonary disease) ([place])   . Diverticulosis    by colonoscopy  . Eczema    per prior PCP records  . Elevated PSA 2015   peaked 6s, s/p benign biopsy 2014, sees urology [name] ([place])  . Essential tremor   . GERD (gastroesophageal reflux disease)    per prior PCP records  . History of panic attacks    per prior PCP records  . HTN (hypertension)   . Mild intermittent asthma in adult without complication   . Obesity, Class I, BMI 30-34.9   . Osteoporosis    DEXA 06/2013 with osteopenia - h/o ?wrist/hip fracture from AVN from chronic steroid use (asthma), took reclast for 1 year  . Renal artery stenosis in 1 of 2 vessels ([place]) 2011   by [place]  . Seasonal allergies   . Sleep apnea   . Thoracic scoliosis childhood  . Transaminitis    per prior PCP records    Family History  Problem Relation Age of Onset  . Cancer Father 57       colon  . Prostate cancer Father   . Diabetes Mother   . Alcohol abuse Mother   . Stroke Mother   . Cancer Maternal Grandmother        pancreatic  . Ataxia Brother   . Ataxia Sister   . CAD Neg Hx     Past Surgical History:  Procedure Laterality Date  . APPLICATION OF WOUND VAC Left 05/09/2018   Procedure: Application Of Wound Vac;  Surgeon: Newt Minion, MD;  Location: [facility];  Service: Orthopedics;   Laterality: Left;  . COLONOSCOPY  12/2013   polyps, int hem, diverticulosis, rpt 5 yrs ([place])  . ELBOW SURGERY Right 2008   golfer's elbow  . INGUINAL HERNIA REPAIR Bilateral childhood & ~1995  . NASAL SEPTUM SURGERY  2013   deviated septum  . ORIF ANKLE FRACTURE Left 05/09/2018   Procedure: OPEN REDUCTION INTERNAL FIXATION LEFT LISFRANC FRACTURE/DISLOCATION;  Surgeon: Newt Minion, MD;  Location: [facility];  Service: Orthopedics;  Laterality:  Left;  . US ECHOCARDIOGRAPHY  02/2009   WNL, EF >55% Claiborne Billings)  . US RENAL/AORTA Right 05/2009   1-59% diameter reduction renal artery, rec rpt 2 yrs   Social History   Occupational History  . Not on file  Tobacco Use  . Smoking status: Never Smoker  . Smokeless tobacco: Never Used  Substance and Sexual Activity  . Alcohol use: Yes    Alcohol/week: 14.0 standard drinks    Types: 14 Glasses of wine per week    Comment: Social  . Drug use: No  . Sexual activity: Yes    Birth control/protection: None

## 2018-09-02 ENCOUNTER — Encounter: Payer: Self-pay | Admitting: Orthopedic Surgery

## 2018-10-09 ENCOUNTER — Telehealth: Payer: Self-pay | Admitting: General Surgery

## 2018-10-09 NOTE — Telephone Encounter (Signed)
Received fax re-certification form from [facility] (in [place], [place]) stating they "spoke with the patient and he is still using the equipment, or please send overnight oximetry test result".  I called Melissa with [employer] and asked if they had a recent test on this patient as there was not an order placed. I advised her of the company that sent the request and that the second page stated the equipment was a oxygen concentrator [phone]) and oxygen portable [id number]).  Melissa stated this could be because the patient insurance has changed, but she will check into it to see if it will be needed and call back.

## 2018-10-12 ENCOUNTER — Other Ambulatory Visit: Payer: Self-pay | Admitting: Cardiovascular Disease

## 2018-10-13 ENCOUNTER — Telehealth: Payer: Self-pay | Admitting: Family Medicine

## 2018-10-13 NOTE — Telephone Encounter (Signed)
Make new pt appt with nxt available provider

## 2018-10-13 NOTE — Telephone Encounter (Signed)
Pt would like to do new patient appt as a telemed due to respiratory issues. Please advise

## 2018-10-14 NOTE — Telephone Encounter (Signed)
Called pt to confirm telemed with provider

## 2018-10-16 ENCOUNTER — Ambulatory Visit: Payer: Self-pay | Admitting: Family Medicine

## 2018-11-06 ENCOUNTER — Telehealth: Payer: PRIVATE HEALTH INSURANCE | Admitting: Family Medicine

## 2018-11-06 ENCOUNTER — Other Ambulatory Visit: Payer: Self-pay

## 2018-11-07 ENCOUNTER — Encounter: Payer: Self-pay | Admitting: Family Medicine

## 2018-11-07 ENCOUNTER — Encounter: Payer: PRIVATE HEALTH INSURANCE | Admitting: Family Medicine

## 2018-11-07 ENCOUNTER — Telehealth: Payer: Self-pay | Admitting: Family Medicine

## 2018-11-07 ENCOUNTER — Other Ambulatory Visit: Payer: Self-pay

## 2018-11-07 DIAGNOSIS — R0600 Dyspnea, unspecified: Secondary | ICD-10-CM

## 2018-11-07 NOTE — Telephone Encounter (Signed)
11/07/2018 - PATIENT HAD A VIRTUAL (DOXY.ME) APPOINTMENT SCHEDULED WITH DR. Benay Spice ON Friday (11/07/2018) AT 4:40pm. WHEN DR. Benay Spice CALLED HIM HE COULD NOT TALK BECAUSE HE HAD ANOTHER APPOINTMENT TO MAKE. DR. Benay Spice ASKED ME TO CALL AND RESCHEDULE [name]. WHEN I TRIED TO CALL I HAD TO LEAVE HIM A VOICE MAIL TO RETURN MY CALL NEXT WEEK SO THAT WE CAN GET HIS APPOINTMENT IN. [place]

## 2018-11-07 NOTE — Patient Instructions (Signed)
° ° ° °  If you have lab work done today you will be contacted with your lab results within the next 2 weeks.  If you have not heard from us then please contact us. The fastest way to get your results is to register for My Chart. ° ° °IF you received an x-ray today, you will receive an invoice from [facility]. Please contact [facility] at [phone] with questions or concerns regarding your invoice.  ° °IF you received labwork today, you will receive an invoice from [facility]. Please contact [facility] at [phone] with questions or concerns regarding your invoice.  ° °Our billing staff will not be able to assist you with questions regarding bills from these companies. ° °You will be contacted with the lab results as soon as they are available. The fastest way to get your results is to activate your My Chart account. Instructions are located on the last page of this paperwork. If you have not heard from us regarding the results in 2 weeks, please contact this office. °  ° ° ° °

## 2018-11-07 NOTE — Progress Notes (Signed)
ESTABLISH CARE   XANAX--wants to talk about this med vs SSRI  Sees Phycologist.   Possible some lab work.

## 2018-11-07 NOTE — Telephone Encounter (Signed)
Okay to set up nocturnal oximetry on CPAP/room air

## 2018-11-07 NOTE — Progress Notes (Signed)
Had to reschedule This encounter was created in error - please disregard.

## 2018-11-11 ENCOUNTER — Other Ambulatory Visit: Payer: Self-pay | Admitting: Orthopedic Surgery

## 2018-11-14 ENCOUNTER — Other Ambulatory Visit: Payer: Self-pay

## 2018-11-14 ENCOUNTER — Telehealth: Payer: PRIVATE HEALTH INSURANCE | Admitting: Family Medicine

## 2018-11-18 ENCOUNTER — Telehealth (INDEPENDENT_AMBULATORY_CARE_PROVIDER_SITE_OTHER): Payer: 59 | Admitting: Family Medicine

## 2018-11-18 ENCOUNTER — Other Ambulatory Visit: Payer: Self-pay

## 2018-11-18 DIAGNOSIS — G250 Essential tremor: Secondary | ICD-10-CM

## 2018-11-18 DIAGNOSIS — F4323 Adjustment disorder with mixed anxiety and depressed mood: Secondary | ICD-10-CM

## 2018-11-18 DIAGNOSIS — I10 Essential (primary) hypertension: Secondary | ICD-10-CM

## 2018-11-18 DIAGNOSIS — G4733 Obstructive sleep apnea (adult) (pediatric): Secondary | ICD-10-CM

## 2018-11-18 MED ORDER — ALPRAZOLAM 0.5 MG PO TABS: 0.5000 mg | ORAL_TABLET | Freq: Every day | ORAL | 0 refills | Status: DC | PRN

## 2018-11-18 MED ORDER — ESCITALOPRAM OXALATE 10 MG PO TABS: 10.0000 mg | ORAL_TABLET | Freq: Every day | ORAL | 2 refills | Status: DC

## 2018-11-18 NOTE — Progress Notes (Signed)
Pt currently being tx by pulm, cards, uro and ortho. Ortho due to fall, broke foot, taking gabapentin prn. Had 5 pins and plate placed in foot. Looking to est care, did not want to come in the office due to resp issues. Medication list verified. Wants to talk about ssri med for alternative to xanax. He was given the xanax is 04-21-12 for death of dad and claustrophobia. He does not have that px anymore. He says he now using it for the tremors he has in his hands and feet. Psychologist suggested that he speak with pcp regarding a change in this med due to addictive properties. He is having lots of neuropathy  In the broke foot. Has pt appt today at 1245. If need, he can be called at end of day if he is unavailable to answer due to prior appt. Dx with heart failure this yr.

## 2018-11-18 NOTE — Progress Notes (Signed)
Virtual Visit Note  I connected with patient on 11/18/18 at 1116am by video doxyme and verified that I am speaking with the correct person using two identifiers. Louis [name] is currently located at home and patient is currently with them during visit. The provider, Rutherford Guys, MD is located in their office at time of visit.  I discussed the limitations, risks, security and privacy concerns of performing an evaluation and management service by telephone and the availability of in person appointments. I also discussed with the patient that there may be a patient responsible charge related to this service. The patient expressed understanding and agreed to proceed.   CC: establish care  HPI   Previous PCP Dr Dionisio Paschal, [facility] Changing PCP due to new job back in [place]  Fractured LEFT foot - ORIF by Dr Sharol Given, in May 2020, had to be 3 months NWB, has upcoming appt with PT, has significant neuropathy in that foot since fracture/surgery, has rx gabapentin ? Anxiety - has been going to counseling for years, concerned for potential alprazolam dependence/abuse, rx given after death of his father in 03/28/2012, last rx for 30 tabs Jan 2019, also having issues with depression, reports mild, has had multiple serious health issues, death in family, has been drinking more alcohol than normal, pastor  d2CHF/HTN/renal artery stenosis - Dr Claiborne Billings, last OV dec 2019, yearly followup, most recent BP reading 102/67, on metoprolol, losartan, amlodipine, spironolactone, denies any CP, SOB, edema, palpitations  Mild intermittent asthma/OSA on cpap and O2/restrictive lung disease 2/2 scoliosis - Dr Elsworth Soho, last appt dec 2019, uses cpap nightly, reports sign improvement in sleep and energy since started on cpap  BPH w elevated PSA - sees urology yearly,  benign biopsy in Mar 28, 2013  Essential tremor - no formal diagnosis, has family members with same tremor, wife retired obgyn told him it is essential tremor , worsening,  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 Accupril [Quinapril Hcl] Cough    Prior to Admission medications   Medication Sig Start Date End Date Taking? Authorizing Provider  ALPRAZolam Duanne Moron) 0.5 MG tablet Take 1 tablet (0.5 mg total) by mouth at bedtime as needed for sleep. 01/13/17  Yes Ria Bush, MD  amLODipine (NORVASC) 5 MG tablet Take 1 tablet (5 mg total) by mouth daily. 02/17/18  Yes Troy Sine, MD  gabapentin (NEURONTIN) 300 MG capsule TAKE 1 CAPSULE BY MOUTH THREE TIMES DAILY WHEN NECESSARY 11/11/18  Yes Newt Minion, MD  losartan (COZAAR) 100 MG tablet TAKE 1 TABLET BY MOUTH EVERY DAY(NEEDS ANNUAL PHYSICAL APPOINTMENT FOR ADDITIONAL REFILLS Patient taking differently: Take 100 mg by mouth daily.  02/17/18  Yes Troy Sine, MD  metoprolol succinate (TOPROL-XL) 50 MG 24 hr tablet Take 3 tablets (150 mg total) by mouth daily. Take with or immediately following a meal. 02/17/18  Yes Troy Sine, MD  spironolactone (ALDACTONE) 25 MG tablet TAKE 1 TABLET BY MOUTH EVERY DAY 10/14/18  Yes Troy Sine, MD    Past Medical History:  Diagnosis Date  . Anxiety    situational  . BPH (benign prostatic hyperplasia)    on flomax  . Bundle branch block    per prior PCP records  . CHF (congestive heart failure) ([place])   . COPD (chronic obstructive pulmonary disease) ([place])   . Diverticulosis    by colonoscopy  . Eczema    per  prior PCP records  . Elevated PSA 2015   peaked 6s, s/p benign biopsy 2014, sees urology [name] ([place])  . Essential tremor   . GERD (gastroesophageal reflux disease)    per prior PCP records  . History of panic attacks    per prior PCP records  . HTN (hypertension)   . Mild intermittent asthma in adult without complication   . Obesity, Class I, BMI 30-34.9   . Osteoporosis    DEXA 06/2013 with osteopenia - h/o ?wrist/hip fracture from AVN from  chronic steroid use (asthma), took reclast for 1 year  . Renal artery stenosis in 1 of 2 vessels ([place]) 2011   by [place]  . Seasonal allergies   . Sleep apnea   . Thoracic scoliosis childhood  . Transaminitis    per prior PCP records    Past Surgical History:  Procedure Laterality Date  . APPLICATION OF WOUND VAC Left 05/09/2018   Procedure: Application Of Wound Vac;  Surgeon: Newt Minion, MD;  Location: [facility];  Service: Orthopedics;  Laterality: Left;  . COLONOSCOPY  12/2013   polyps, int hem, diverticulosis, rpt 5 yrs ([place])  . ELBOW SURGERY Right 2008   golfer's elbow  . INGUINAL HERNIA REPAIR Bilateral childhood & ~1995  . NASAL SEPTUM SURGERY  2013   deviated septum  . ORIF ANKLE FRACTURE Left 05/09/2018   Procedure: OPEN REDUCTION INTERNAL FIXATION LEFT LISFRANC FRACTURE/DISLOCATION;  Surgeon: Newt Minion, MD;  Location: [facility];  Service: Orthopedics;  Laterality: Left;  . US ECHOCARDIOGRAPHY  02/2009   WNL, EF >55% Claiborne Billings)  . US RENAL/AORTA Right 05/2009   1-59% diameter reduction renal artery, rec rpt 2 yrs    Social History   Tobacco Use  . Smoking status: Never Smoker  . Smokeless tobacco: Never Used  Substance Use Topics  . Alcohol use: Yes    Alcohol/week: 14.0 standard drinks    Types: 14 Glasses of wine per week    Comment: Social    Family History  Problem Relation Age of Onset  . Cancer Father 1       colon  . Prostate cancer Father   . Diabetes Mother   . Alcohol abuse Mother   . Stroke Mother   . Cancer Maternal Grandmother        pancreatic  . Ataxia Brother   . Ataxia Sister   . CAD Neg Hx     ROS Per hpi  Objective  Vitals as reported by the patient: per above   ASSESSMENT and PLAN  1. Adjustment reaction with anxiety and depression Discussed treatment options, trial of lexapro, reviewed r/se/b. Discussed etoh effects on mood, importance to cut back. Continue with counseling.   2. Essential tremor Diagnosis seems most likely per  history. interferes with work (typing sermons), already on BB for dCHF/HTN, over option is primidone which does not have a better adverse effect profile than bzd. Therefore will continue with very prn use of bzd for now, pmp reviewed. Reviewed r/se/b  3. OSA (obstructive sleep apnea) On cpap, managed by pulm  4. Essential hypertension Controlled. Cont current mgt  Other orders - escitalopram (LEXAPRO) 10 MG tablet; Take 1 tablet (10 mg total) by mouth daily. - ALPRAZolam (XANAX) 0.5 MG tablet; Take 1 tablet (0.5 mg total) by mouth daily as needed (essential tremor).  FOLLOW-UP: 4 weeks   The above assessment and management plan was discussed with the patient. The patient verbalized understanding of and has agreed to  the management plan. Patient is aware to call the clinic if symptoms persist or worsen. Patient is aware when to return to the clinic for a follow-up visit. Patient educated on when it is appropriate to go to the emergency department.    I provided 30  minutes of non-face-to-face time during this encounter.  Rutherford Guys, MD [facility] at [facility] [place], [place] [address] Ph.  [phone] Fax [phone]

## 2018-11-19 ENCOUNTER — Encounter: Payer: Self-pay | Admitting: Pulmonary Disease

## 2018-11-24 ENCOUNTER — Encounter: Payer: Self-pay | Admitting: Family Medicine

## 2018-11-24 NOTE — Telephone Encounter (Signed)
Mychart message received by pt asking about the results of the ONO he had performed. [name] is not back in the office until 12/3.   I have located pt's ONO results. Tammy, please advise if you are okay reviewing this for pt. Thanks!

## 2018-11-25 NOTE — Telephone Encounter (Signed)
Louis [name] has been placed on TP's desk for her to review.

## 2018-11-26 NOTE — Telephone Encounter (Signed)
Continues to desats ((2.5 hr <88% -max low 70%) on CPAP on room air  Continue on Oxygen with CPAP .

## 2018-12-01 MED ORDER — SERTRALINE HCL 25 MG PO TABS: 25.0000 mg | ORAL_TABLET | Freq: Every day | ORAL | 2 refills | Status: DC

## 2018-12-03 NOTE — Telephone Encounter (Signed)
Mychart message received from pt which is posted below:   To: [facility]    From: Louis [name]    Created: 12/03/2018 2:02 PM     *-*-*This message was handled on 12/03/2018 2:49 PM by Jacon Whetzel P*-*-*  Hi,  We are trying to get a refund on some plane flights to go see our daughter in [place]. The airline wants a Dr. note that it is not advisable for me to fly given my respiratory condition. Could you please provide that note?Thank you, Louis [name]     [name], please advise on this and if you are fine with [place] writing the letter what we should say in the letter. Thanks!

## 2018-12-03 NOTE — Telephone Encounter (Signed)
Okay to provide letter stating that he would be at high risk for worsening if he gets Covid infection and hence is advised not to travel

## 2018-12-04 NOTE — Telephone Encounter (Signed)
ONO on CPAP/room air showed mild desaturation for about 2 hours during the night Okay to go without oxygen for 7 nights for his trip but while at home suggest that he continue to use oxygen

## 2018-12-04 NOTE — Telephone Encounter (Signed)
Letter has been typed and available in patient's chart. Will need to know if he wants to stop by the office for a signed copy or if he wants [place] to mail the letter to him.

## 2018-12-19 ENCOUNTER — Other Ambulatory Visit: Payer: Self-pay

## 2018-12-19 ENCOUNTER — Telehealth (INDEPENDENT_AMBULATORY_CARE_PROVIDER_SITE_OTHER): Payer: 59 | Admitting: Family Medicine

## 2018-12-19 NOTE — Progress Notes (Signed)
CC- 1 month recheck on Chronic medical conditions-

## 2018-12-22 ENCOUNTER — Other Ambulatory Visit: Payer: Self-pay | Admitting: Cardiovascular Disease

## 2018-12-23 ENCOUNTER — Telehealth: Payer: PRIVATE HEALTH INSURANCE | Admitting: Family Medicine

## 2018-12-29 ENCOUNTER — Other Ambulatory Visit: Payer: Self-pay | Admitting: Cardiovascular Disease

## 2018-12-31 ENCOUNTER — Other Ambulatory Visit: Payer: Self-pay

## 2018-12-31 ENCOUNTER — Encounter: Payer: Self-pay | Admitting: Acute Care

## 2018-12-31 ENCOUNTER — Ambulatory Visit (INDEPENDENT_AMBULATORY_CARE_PROVIDER_SITE_OTHER): Payer: 59 | Admitting: Acute Care

## 2018-12-31 ENCOUNTER — Telehealth: Payer: Self-pay | Admitting: Acute Care

## 2018-12-31 DIAGNOSIS — Z9989 Dependence on other enabling machines and devices: Secondary | ICD-10-CM

## 2018-12-31 DIAGNOSIS — G4733 Obstructive sleep apnea (adult) (pediatric): Secondary | ICD-10-CM

## 2018-12-31 NOTE — Progress Notes (Signed)
Virtual Visit via Telephone Note  I connected with Louis [name] on 12/31/18 at  4:00 PM EST by telephone and verified that I am speaking with the correct person using two identifiers.  Location: Patient: at the office Provider: [facility], [place], [place], [address]   I discussed the limitations, risks, security and privacy concerns of performing an evaluation and management service by telephone and the availability of in person appointments. I also discussed with the patient that there may be a patient responsible charge related to this service. The patient expressed understanding and agreed to proceed.   History of Present Illness: Pt. Presents for follow up. He called the office today with complaints of poor control of his CPAP machine. He had noticed that his AHI has been higher than usual. Per the down Load he has had increase in his AHI. He has not been waking with a headache, and he has not had any daytime sleepiness. He has grown a beard during the [place] pandemic that may be affecting his mask fit.    Observations/Objective: Down Load 12/01/2018-12/30/2018 Airsense 10 Autoset Usage 30/30 days > 4 hours 20 days < 4 hours 10 days Set pressure of 11 cm H2O Leaks median 11, Maximum 43 AHI is 14.5  Graphic shows significant leaks on the night of increased events.   Assessment and Plan: OSA on CPAP with increase in AHI  New Beard Leaks per the down Load Good Compliance We will place an order for new supplies for your CPAP machine through [employer].( New mask and straps), and anything else you need. We will ask [employer] to interrogate your machine to assure there are no issues with the device.  Shave your beard to see if we can create a better mask fit We will do a tele visit with download in 6 weeks to re-evaluate your numbers. Continue on CPAP at bedtime. You appear to be benefiting from the treatment  Goal is to wear for at least 6 hours each night for maximal clinical  benefit. Continue to work on weight loss, as the link between excess weight  and sleep apnea is well established.   Remember to establish a good bedtime routine, and work on sleep hygiene.  Limit daytime naps , avoid stimulants such as caffeine and nicotine close to bedtime, exercise daily to promote sleep quality, avoid heavy , spicy, fried , or rich foods before bed. Ensure adequate exposure to natural light during the day,establish a relaxing bedtime routine with a pleasant sleep environment ( Bedroom between 60 and 67 degrees, turn off bright lights , TV or device screens screens , consider black out curtains or white noise machines) Do not drive if sleepy. Remember to clean mask, tubing, filter, and reservoir once weekly with soapy water.  Follow up with Syana Degraffenreid NP   In 6 weeks or before as needed.  Wednesday February 04, 2019 at 1:30 pm. ( Tele visit.) If no improvement we will schedule a CPAP titration  Follow Up Instructions: Follow up with Raina Sole NP   In 6 weeks or before as needed.  Wednesday February 04, 2019 at 1:30 pm. ( Tele visit.)   I discussed the assessment and treatment plan with the patient. The patient was provided an opportunity to ask questions and all were answered. The patient agreed with the plan and demonstrated an understanding of the instructions.   The patient was advised to call back or seek an in-person evaluation if the symptoms worsen or if the  condition fails to improve as anticipated.  I provided 35 minutes of non-face-to-face time during this encounter.   Magdalen Spatz, NP 12/31/2018 4:14 PM

## 2018-12-31 NOTE — Patient Instructions (Addendum)
It is good to talk with you today We will place an order for new supplies for your CPAP machine through [employer].( New mask and straps), and anything else you need. We will ask [employer] to interrogate your machine to assure there are no issues with the device.  Shave your beard to see if we can create a better mask fit We will do a tele visit with download in 6 weeks to re-evaluate your numbers. Continue on CPAP at bedtime. You appear to be benefiting from the treatment  Goal is to wear for at least 6 hours each night for maximal clinical benefit. Continue to work on weight loss, as the link between excess weight  and sleep apnea is well established.   Remember to establish a good bedtime routine, and work on sleep hygiene.  Limit daytime naps , avoid stimulants such as caffeine and nicotine close to bedtime, exercise daily to promote sleep quality, avoid heavy , spicy, fried , or rich foods before bed. Ensure adequate exposure to natural light during the day,establish a relaxing bedtime routine with a pleasant sleep environment ( Bedroom between 60 and 67 degrees, turn off bright lights , TV or device screens screens , consider black out curtains or white noise machines) Do not drive if sleepy. Remember to clean mask, tubing, filter, and reservoir once weekly with soapy water.  Follow up with Judson Roch NP   In 6 weeks  With down load or before as needed.  Wednesday February 04, 2019 at 1:30 pm. ( Tele visit.) If no improvement we will schedule a CPAP titration Please contact office for sooner follow up if symptoms do not improve or worsen or seek emergency care

## 2018-12-31 NOTE — Telephone Encounter (Signed)
Attempted to call pt to schedule televisit with Louis [name] but unable to reach. Left pt a detailed message letting him know that we needed to get this visit scheduled.  When pt calls back, please schedule a televisit for pt with [name].

## 2018-12-31 NOTE — Addendum Note (Signed)
Addended by: Jannette Spanner on: 12/31/2018 04:21 PM   Modules accepted: Orders

## 2018-12-31 NOTE — Telephone Encounter (Signed)
Mychart message sent by pt which is posted below:  To: [facility] PO[facility]    From: ZAMAURI SHERTZER    Created: 12/31/2018 9:50 AM     *-*-*This message was handled on 12/31/2018 10:09 AM by Daveena Elmore P*-*-*  Hi,  I have been having more "events" with my CPAP than I think I should be having. I'm wondering if the pressure on my CPAP needs to be increased. Let me know. If I remember right, someone else needs to do that. Thanks, Jaykob Gadison, please advise on this for pt. I have printed download for you to be able to review as well.

## 2018-12-31 NOTE — Telephone Encounter (Signed)
He needs to have a tele visit . He has leaks noted on his down load. He may need a new mask or CPAP titration study.  You can add to my schedule at 3:30. Thanks

## 2019-01-05 ENCOUNTER — Inpatient Hospital Stay
Admission: EM | Admit: 2019-01-05 | Discharge: 2019-01-09 | DRG: 329 | Disposition: A | Discharge status: Home / Self Care | Payer: 59 | Source: Ambulatory Visit | Attending: Surgery | Admitting: Surgery

## 2019-01-05 ENCOUNTER — Emergency Department: Payer: 59

## 2019-01-05 ENCOUNTER — Emergency Department: Payer: 59 | Admitting: Anesthesiology

## 2019-01-05 ENCOUNTER — Encounter
Admission: EM | Disposition: A | Discharge status: Home / Self Care | Payer: Self-pay | Source: Home / Self Care | Attending: Surgery

## 2019-01-05 ENCOUNTER — Inpatient Hospital Stay: Payer: 59

## 2019-01-05 ENCOUNTER — Other Ambulatory Visit: Payer: Self-pay

## 2019-01-05 DIAGNOSIS — N400 Enlarged prostate without lower urinary tract symptoms: Secondary | ICD-10-CM | POA: Diagnosis present

## 2019-01-05 DIAGNOSIS — Y92234 Operating room of hospital as the place of occurrence of the external cause: Secondary | ICD-10-CM | POA: Diagnosis not present

## 2019-01-05 DIAGNOSIS — J9601 Acute respiratory failure with hypoxia: Secondary | ICD-10-CM

## 2019-01-05 DIAGNOSIS — E861 Hypovolemia: Secondary | ICD-10-CM | POA: Diagnosis not present

## 2019-01-05 DIAGNOSIS — K760 Fatty (change of) liver, not elsewhere classified: Secondary | ICD-10-CM | POA: Diagnosis present

## 2019-01-05 DIAGNOSIS — Z8042 Family history of malignant neoplasm of prostate: Secondary | ICD-10-CM

## 2019-01-05 DIAGNOSIS — F411 Generalized anxiety disorder: Secondary | ICD-10-CM | POA: Diagnosis present

## 2019-01-05 DIAGNOSIS — K5730 Diverticulosis of large intestine without perforation or abscess without bleeding: Secondary | ICD-10-CM | POA: Diagnosis present

## 2019-01-05 DIAGNOSIS — J302 Other seasonal allergic rhinitis: Secondary | ICD-10-CM | POA: Diagnosis present

## 2019-01-05 DIAGNOSIS — I110 Hypertensive heart disease with heart failure: Secondary | ICD-10-CM | POA: Diagnosis present

## 2019-01-05 DIAGNOSIS — N179 Acute kidney failure, unspecified: Secondary | ICD-10-CM | POA: Diagnosis present

## 2019-01-05 DIAGNOSIS — E872 Acidosis, unspecified: Secondary | ICD-10-CM | POA: Diagnosis present

## 2019-01-05 DIAGNOSIS — K261 Acute duodenal ulcer with perforation: Principal | ICD-10-CM | POA: Diagnosis present

## 2019-01-05 DIAGNOSIS — K560 Paralytic ileus: Secondary | ICD-10-CM | POA: Diagnosis not present

## 2019-01-05 DIAGNOSIS — Z20822 Contact with and (suspected) exposure to covid-19: Secondary | ICD-10-CM | POA: Diagnosis present

## 2019-01-05 DIAGNOSIS — Z823 Family history of stroke: Secondary | ICD-10-CM

## 2019-01-05 DIAGNOSIS — I701 Atherosclerosis of renal artery: Secondary | ICD-10-CM | POA: Diagnosis present

## 2019-01-05 DIAGNOSIS — Z833 Family history of diabetes mellitus: Secondary | ICD-10-CM

## 2019-01-05 DIAGNOSIS — A419 Sepsis, unspecified organism: Secondary | ICD-10-CM | POA: Diagnosis not present

## 2019-01-05 DIAGNOSIS — R198 Other specified symptoms and signs involving the digestive system and abdomen: Secondary | ICD-10-CM | POA: Diagnosis not present

## 2019-01-05 DIAGNOSIS — K631 Perforation of intestine (nontraumatic): Secondary | ICD-10-CM

## 2019-01-05 DIAGNOSIS — Z6829 Body mass index (BMI) 29.0-29.9, adult: Secondary | ICD-10-CM

## 2019-01-05 DIAGNOSIS — K219 Gastro-esophageal reflux disease without esophagitis: Secondary | ICD-10-CM | POA: Diagnosis present

## 2019-01-05 DIAGNOSIS — Z4659 Encounter for fitting and adjustment of other gastrointestinal appliance and device: Secondary | ICD-10-CM

## 2019-01-05 DIAGNOSIS — R739 Hyperglycemia, unspecified: Secondary | ICD-10-CM | POA: Diagnosis not present

## 2019-01-05 DIAGNOSIS — N281 Cyst of kidney, acquired: Secondary | ICD-10-CM | POA: Diagnosis present

## 2019-01-05 DIAGNOSIS — D649 Anemia, unspecified: Secondary | ICD-10-CM | POA: Diagnosis not present

## 2019-01-05 DIAGNOSIS — R1013 Epigastric pain: Secondary | ICD-10-CM | POA: Diagnosis present

## 2019-01-05 DIAGNOSIS — Z79899 Other long term (current) drug therapy: Secondary | ICD-10-CM

## 2019-01-05 DIAGNOSIS — R571 Hypovolemic shock: Secondary | ICD-10-CM | POA: Diagnosis not present

## 2019-01-05 DIAGNOSIS — E875 Hyperkalemia: Secondary | ICD-10-CM | POA: Diagnosis present

## 2019-01-05 DIAGNOSIS — J449 Chronic obstructive pulmonary disease, unspecified: Secondary | ICD-10-CM | POA: Diagnosis present

## 2019-01-05 DIAGNOSIS — E871 Hypo-osmolality and hyponatremia: Secondary | ICD-10-CM | POA: Diagnosis present

## 2019-01-05 DIAGNOSIS — R6521 Severe sepsis with septic shock: Secondary | ICD-10-CM | POA: Diagnosis not present

## 2019-01-05 DIAGNOSIS — J4520 Mild intermittent asthma, uncomplicated: Secondary | ICD-10-CM | POA: Diagnosis present

## 2019-01-05 DIAGNOSIS — T411X5A Adverse effect of intravenous anesthetics, initial encounter: Secondary | ICD-10-CM | POA: Diagnosis not present

## 2019-01-05 DIAGNOSIS — I5032 Chronic diastolic (congestive) heart failure: Secondary | ICD-10-CM | POA: Diagnosis present

## 2019-01-05 DIAGNOSIS — I272 Pulmonary hypertension, unspecified: Secondary | ICD-10-CM | POA: Diagnosis present

## 2019-01-05 DIAGNOSIS — M810 Age-related osteoporosis without current pathological fracture: Secondary | ICD-10-CM | POA: Diagnosis present

## 2019-01-05 DIAGNOSIS — Z888 Allergy status to other drugs, medicaments and biological substances status: Secondary | ICD-10-CM

## 2019-01-05 DIAGNOSIS — I454 Nonspecific intraventricular block: Secondary | ICD-10-CM | POA: Diagnosis present

## 2019-01-05 DIAGNOSIS — J95821 Acute postprocedural respiratory failure: Secondary | ICD-10-CM | POA: Diagnosis not present

## 2019-01-05 DIAGNOSIS — G4733 Obstructive sleep apnea (adult) (pediatric): Secondary | ICD-10-CM | POA: Diagnosis present

## 2019-01-05 DIAGNOSIS — K275 Chronic or unspecified peptic ulcer, site unspecified, with perforation: Secondary | ICD-10-CM

## 2019-01-05 DIAGNOSIS — E669 Obesity, unspecified: Secondary | ICD-10-CM | POA: Diagnosis present

## 2019-01-05 DIAGNOSIS — G250 Essential tremor: Secondary | ICD-10-CM | POA: Diagnosis present

## 2019-01-05 DIAGNOSIS — Z789 Other specified health status: Secondary | ICD-10-CM

## 2019-01-05 HISTORY — PX: LAPAROTOMY: SHX154

## 2019-01-05 LAB — TYPE AND SCREEN
ABO/RH(D): O POS
Antibody Screen: NEGATIVE

## 2019-01-05 LAB — URINALYSIS, COMPLETE (UACMP) WITH MICROSCOPIC
Bilirubin Urine: NEGATIVE
Glucose, UA: 50 mg/dL — AB
Ketones, ur: 5 mg/dL — AB
Leukocytes,Ua: NEGATIVE
Nitrite: NEGATIVE
Protein, ur: 100 mg/dL — AB
Specific Gravity, Urine: 1.017 (ref 1.005–1.030)
pH: 5 (ref 5.0–8.0)

## 2019-01-05 LAB — CBC
HCT: 33.2 % — ABNORMAL LOW (ref 39.0–52.0)
Hemoglobin: 11.7 g/dL — ABNORMAL LOW (ref 13.0–17.0)
MCH: 34.9 pg — ABNORMAL HIGH (ref 26.0–34.0)
MCHC: 35.2 g/dL (ref 30.0–36.0)
MCV: 99.1 fL (ref 80.0–100.0)
Platelets: 250 10*3/uL (ref 150–400)
RBC: 3.35 MIL/uL — ABNORMAL LOW (ref 4.22–5.81)
RDW: 11.9 % (ref 11.5–15.5)
WBC: 4.3 10*3/uL (ref 4.0–10.5)
nRBC: 0 % (ref 0.0–0.2)

## 2019-01-05 LAB — TROPONIN I (HIGH SENSITIVITY): Troponin I (High Sensitivity): 4 ng/L (ref <18)

## 2019-01-05 LAB — RESPIRATORY PANEL BY RT PCR (FLU A&B, COVID)
Influenza A by PCR: NEGATIVE
Influenza B by PCR: NEGATIVE
SARS Coronavirus 2 by RT PCR: NEGATIVE

## 2019-01-05 LAB — COMPREHENSIVE METABOLIC PANEL
ALT: 126 U/L — ABNORMAL HIGH (ref 0–44)
AST: 100 U/L — ABNORMAL HIGH (ref 15–41)
Albumin: 4.3 g/dL (ref 3.5–5.0)
Alkaline Phosphatase: 58 U/L (ref 38–126)
Anion gap: 15 (ref 5–15)
BUN: 21 mg/dL (ref 8–23)
CO2: 19 mmol/L — ABNORMAL LOW (ref 22–32)
Calcium: 9.7 mg/dL (ref 8.9–10.3)
Chloride: 85 mmol/L — ABNORMAL LOW (ref 98–111)
Creatinine, Ser: 1.69 mg/dL — ABNORMAL HIGH (ref 0.61–1.24)
GFR calc Af Amer: 50 mL/min — ABNORMAL LOW (ref >60)
GFR calc non Af Amer: 43 mL/min — ABNORMAL LOW (ref >60)
Glucose, Bld: 140 mg/dL — ABNORMAL HIGH (ref 70–99)
Potassium: 6 mmol/L — ABNORMAL HIGH (ref 3.5–5.1)
Sodium: 119 mmol/L — CL (ref 135–145)
Total Bilirubin: 1.2 mg/dL (ref 0.3–1.2)
Total Protein: 7.4 g/dL (ref 6.5–8.1)

## 2019-01-05 LAB — GLUCOSE, CAPILLARY: Glucose-Capillary: 154 mg/dL — ABNORMAL HIGH (ref 70–99)

## 2019-01-05 LAB — LIPASE, BLOOD: Lipase: 59 U/L — ABNORMAL HIGH (ref 11–51)

## 2019-01-05 SURGERY — LAPAROTOMY, EXPLORATORY
Anesthesia: General | Site: Abdomen

## 2019-01-05 MED ORDER — BUPIVACAINE LIPOSOME 1.3 % IJ SUSP
INTRAMUSCULAR | Status: AC
  Filled 2019-01-05: qty 20

## 2019-01-05 MED ORDER — MIDAZOLAM HCL 2 MG/2ML IJ SOLN: 2.0000 mg | INTRAMUSCULAR | Status: DC | PRN

## 2019-01-05 MED ORDER — EPINEPHRINE PF 1 MG/ML IJ SOLN
INTRAMUSCULAR | Status: AC
  Filled 2019-01-05: qty 1

## 2019-01-05 MED ORDER — METRONIDAZOLE IN NACL 5-0.79 MG/ML-% IV SOLN
500.0000 mg | Freq: Once | INTRAVENOUS | Status: AC
  Administered 2019-01-05: 500 mg via INTRAVENOUS
  Filled 2019-01-05: qty 100

## 2019-01-05 MED ORDER — SODIUM CHLORIDE 0.9 % IV SOLN: INTRAVENOUS | Status: DC | PRN

## 2019-01-05 MED ORDER — PROPOFOL 1000 MG/100ML IV EMUL
0.0000 ug/kg/min | INTRAVENOUS | Status: DC
  Filled 2019-01-05: qty 100

## 2019-01-05 MED ORDER — LIDOCAINE HCL (PF) 2 % IJ SOLN
INTRAMUSCULAR | Status: AC
  Filled 2019-01-05: qty 5

## 2019-01-05 MED ORDER — BUPIVACAINE-EPINEPHRINE 0.5% -1:200000 IJ SOLN
INTRAMUSCULAR | Status: DC | PRN
  Administered 2019-01-05: 31 mL

## 2019-01-05 MED ORDER — FENTANYL CITRATE (PF) 100 MCG/2ML IJ SOLN: 25.0000 ug | INTRAMUSCULAR | Status: DC | PRN

## 2019-01-05 MED ORDER — SODIUM CHLORIDE 0.9 % IV BOLUS
1000.0000 mL | Freq: Once | INTRAVENOUS | Status: AC
  Administered 2019-01-05: 1000 mL via INTRAVENOUS

## 2019-01-05 MED ORDER — ETOMIDATE 2 MG/ML IV SOLN
INTRAVENOUS | Status: DC | PRN
  Administered 2019-01-05: 16 mg via INTRAVENOUS

## 2019-01-05 MED ORDER — BUPIVACAINE HCL (PF) 0.5 % IJ SOLN
INTRAMUSCULAR | Status: AC
  Filled 2019-01-05: qty 30

## 2019-01-05 MED ORDER — SODIUM CHLORIDE 0.9% FLUSH: 3.0000 mL | Freq: Once | INTRAVENOUS | Status: DC

## 2019-01-05 MED ORDER — LACTATED RINGERS IV SOLN
INTRAVENOUS | Status: DC
  Administered 2019-01-06: 100 mL/h via INTRAVENOUS

## 2019-01-05 MED ORDER — ONDANSETRON HCL 4 MG/2ML IJ SOLN: 4.0000 mg | Freq: Once | INTRAMUSCULAR | Status: DC | PRN

## 2019-01-05 MED ORDER — FENTANYL BOLUS VIA INFUSION
50.0000 ug | INTRAVENOUS | Status: DC | PRN
  Filled 2019-01-05: qty 50

## 2019-01-05 MED ORDER — BUPIVACAINE LIPOSOME 1.3 % IJ SUSP
INTRAMUSCULAR | Status: DC | PRN
  Administered 2019-01-05: 20 mL

## 2019-01-05 MED ORDER — PIPERACILLIN-TAZOBACTAM 3.375 G IVPB 30 MIN
3.3750 g | Freq: Once | INTRAVENOUS | Status: AC
  Administered 2019-01-05: 3.375 g via INTRAVENOUS
  Filled 2019-01-05: qty 50

## 2019-01-05 MED ORDER — SODIUM CHLORIDE 0.9 % IV SOLN
INTRAVENOUS | Status: DC | PRN
  Administered 2019-01-05: 20 ug/min via INTRAVENOUS

## 2019-01-05 MED ORDER — PHENYLEPHRINE HCL (PRESSORS) 10 MG/ML IV SOLN
INTRAVENOUS | Status: AC
  Filled 2019-01-05: qty 1

## 2019-01-05 MED ORDER — PANTOPRAZOLE SODIUM 40 MG IV SOLR
40.0000 mg | Freq: Every day | INTRAVENOUS | Status: DC
  Administered 2019-01-06 – 2019-01-08 (×3): 40 mg via INTRAVENOUS
  Filled 2019-01-05 (×4): qty 40

## 2019-01-05 MED ORDER — SODIUM CHLORIDE (PF) 0.9 % IJ SOLN
INTRAMUSCULAR | Status: DC | PRN
  Administered 2019-01-05: 40 mL via INTRAVENOUS

## 2019-01-05 MED ORDER — ROCURONIUM BROMIDE 100 MG/10ML IV SOLN: INTRAVENOUS | Status: DC | PRN

## 2019-01-05 MED ORDER — MIDAZOLAM HCL 2 MG/2ML IJ SOLN
INTRAMUSCULAR | Status: DC | PRN
  Administered 2019-01-05 (×4): 1 mg via INTRAVENOUS

## 2019-01-05 MED ORDER — IOHEXOL 300 MG/ML  SOLN
80.0000 mL | Freq: Once | INTRAMUSCULAR | Status: AC | PRN
  Administered 2019-01-05: 100 mL via INTRAVENOUS

## 2019-01-05 MED ORDER — FENTANYL CITRATE (PF) 250 MCG/5ML IJ SOLN
INTRAMUSCULAR | Status: AC
  Filled 2019-01-05: qty 5

## 2019-01-05 MED ORDER — PROPOFOL 10 MG/ML IV BOLUS
INTRAVENOUS | Status: AC
  Filled 2019-01-05: qty 20

## 2019-01-05 MED ORDER — FENTANYL CITRATE (PF) 100 MCG/2ML IJ SOLN
INTRAMUSCULAR | Status: DC | PRN
  Administered 2019-01-05: 100 ug via INTRAVENOUS
  Administered 2019-01-05: 50 ug via INTRAVENOUS
  Administered 2019-01-05: 100 ug via INTRAVENOUS
  Administered 2019-01-05 (×2): 50 ug via INTRAVENOUS

## 2019-01-05 MED ORDER — FENTANYL CITRATE (PF) 100 MCG/2ML IJ SOLN: 50.0000 ug | Freq: Once | INTRAMUSCULAR | Status: DC

## 2019-01-05 MED ORDER — MIDAZOLAM HCL 2 MG/2ML IJ SOLN
INTRAMUSCULAR | Status: AC
  Filled 2019-01-05: qty 2

## 2019-01-05 MED ORDER — EPHEDRINE SULFATE 50 MG/ML IJ SOLN
INTRAMUSCULAR | Status: AC
  Filled 2019-01-05: qty 1

## 2019-01-05 MED ORDER — SODIUM CHLORIDE (PF) 0.9 % IJ SOLN
INTRAMUSCULAR | Status: AC
  Filled 2019-01-05: qty 50

## 2019-01-05 MED ORDER — METRONIDAZOLE IN NACL 5-0.79 MG/ML-% IV SOLN
500.0000 mg | Freq: Three times a day (TID) | INTRAVENOUS | Status: DC
  Filled 2019-01-05 (×2): qty 100

## 2019-01-05 MED ORDER — ROCURONIUM BROMIDE 50 MG/5ML IV SOLN
INTRAVENOUS | Status: AC
  Filled 2019-01-05: qty 3

## 2019-01-05 MED ORDER — FENTANYL 2500MCG IN NS 250ML (10MCG/ML) PREMIX INFUSION
50.0000 ug/h | INTRAVENOUS | Status: DC
  Administered 2019-01-06: 01:00:00 50 ug/h via INTRAVENOUS
  Filled 2019-01-05: qty 250

## 2019-01-05 MED ORDER — FENTANYL CITRATE (PF) 100 MCG/2ML IJ SOLN
INTRAMUSCULAR | Status: AC
  Filled 2019-01-05: qty 2

## 2019-01-05 MED ORDER — ROCURONIUM BROMIDE 100 MG/10ML IV SOLN
INTRAVENOUS | Status: DC | PRN
  Administered 2019-01-05 (×2): 20 mg via INTRAVENOUS
  Administered 2019-01-05: 60 mg via INTRAVENOUS

## 2019-01-05 MED ORDER — PIPERACILLIN-TAZOBACTAM 3.375 G IVPB
3.3750 g | Freq: Three times a day (TID) | INTRAVENOUS | Status: DC
  Administered 2019-01-06 – 2019-01-09 (×10): 3.375 g via INTRAVENOUS
  Filled 2019-01-05 (×10): qty 50

## 2019-01-05 MED ORDER — MORPHINE SULFATE (PF) 4 MG/ML IV SOLN
4.0000 mg | Freq: Once | INTRAVENOUS | Status: AC
  Administered 2019-01-05: 19:00:00 4 mg via INTRAVENOUS
  Filled 2019-01-05: qty 1

## 2019-01-05 SURGICAL SUPPLY — 45 items
BULB RESERV EVAC DRAIN JP 100C (MISCELLANEOUS) ×3 IMPLANT
CANISTER SUCT 1200ML W/VALVE (MISCELLANEOUS) ×3 IMPLANT
CHLORAPREP W/TINT 26 (MISCELLANEOUS) ×3 IMPLANT
COVER WAND RF STERILE (DRAPES) IMPLANT
DRAIN CHANNEL JP 15F RND 16 (MISCELLANEOUS) ×3 IMPLANT
DRAPE LAPAROTOMY 100X77 ABD (DRAPES) ×3 IMPLANT
DRSG OPSITE POSTOP 4X10 (GAUZE/BANDAGES/DRESSINGS) IMPLANT
DRSG OPSITE POSTOP 4X8 (GAUZE/BANDAGES/DRESSINGS) ×3 IMPLANT
DRSG TEGADERM 4X10 (GAUZE/BANDAGES/DRESSINGS) IMPLANT
DRSG TELFA 3X8 NADH (GAUZE/BANDAGES/DRESSINGS) IMPLANT
DRSG TELFA 4X3 1S NADH ST (GAUZE/BANDAGES/DRESSINGS) IMPLANT
ELECT REM PT RETURN 9FT ADLT (ELECTROSURGICAL) ×3
ELECTRODE REM PT RTRN 9FT ADLT (ELECTROSURGICAL) ×1 IMPLANT
GLOVE BIO SURGEON STRL SZ 6.5 (GLOVE) ×2 IMPLANT
GLOVE BIO SURGEON STRL SZ7 (GLOVE) ×3 IMPLANT
GLOVE BIO SURGEON STRL SZ7.5 (GLOVE) ×12 IMPLANT
GLOVE BIO SURGEONS STRL SZ 6.5 (GLOVE) ×1
GLOVE BIOGEL M 7.0 STRL (GLOVE) ×3 IMPLANT
GLOVE BIOGEL M STRL SZ7.5 (GLOVE) ×3 IMPLANT
GLOVE BIOGEL PI IND STRL 7.0 (GLOVE) ×1 IMPLANT
GLOVE BIOGEL PI IND STRL 7.5 (GLOVE) ×4 IMPLANT
GLOVE BIOGEL PI INDICATOR 7.0 (GLOVE) ×2
GLOVE BIOGEL PI INDICATOR 7.5 (GLOVE) ×8
GLOVE SURG SYN 6.5 ES PF (GLOVE) ×3 IMPLANT
GOWN STRL REUS W/ TWL LRG LVL3 (GOWN DISPOSABLE) ×3 IMPLANT
GOWN STRL REUS W/TWL LRG LVL3 (GOWN DISPOSABLE) ×6
KIT TURNOVER KIT A (KITS) ×3 IMPLANT
LABEL OR SOLS (LABEL) ×3 IMPLANT
LIGASURE IMPACT 36 18CM CVD LR (INSTRUMENTS) ×3 IMPLANT
NS IRRIG 1000ML POUR BTL (IV SOLUTION) ×3 IMPLANT
PACK BASIN MAJOR ARMC (MISCELLANEOUS) ×3 IMPLANT
PACK COLON CLEAN CLOSURE (MISCELLANEOUS) IMPLANT
SPONGE LAP 18X18 RF (DISPOSABLE) ×3 IMPLANT
STAPLER SKIN PROX 35W (STAPLE) ×3 IMPLANT
SUT ETHILON 3-0 FS-10 30 BLK (SUTURE) ×3
SUT PDS AB 1 TP1 54 (SUTURE) ×3 IMPLANT
SUT SILK 2 0 (SUTURE)
SUT SILK 2-0 18XBRD TIE 12 (SUTURE) IMPLANT
SUT SILK 3 0 (SUTURE) ×4
SUT SILK 3-0 18XBRD TIE 12 (SUTURE) ×2 IMPLANT
SUT VIC AB 3-0 SH 27 (SUTURE) ×2
SUT VIC AB 3-0 SH 27X BRD (SUTURE) ×1 IMPLANT
SUTURE EHLN 3-0 FS-10 30 BLK (SUTURE) ×1 IMPLANT
TRAY FOLEY MTR SLVR 16FR STAT (SET/KITS/TRAYS/PACK) ×3 IMPLANT
surgical snow ×3 IMPLANT

## 2019-01-05 NOTE — ED Notes (Signed)
Pt to OR.  Iv infused.

## 2019-01-05 NOTE — Anesthesia Procedure Notes (Signed)
Performed by: Lendon Colonel, CRNA

## 2019-01-05 NOTE — Anesthesia Procedure Notes (Signed)
Procedure Name: Intubation Date/Time: 01/05/2019 8:02 PM Performed by: Lendon Colonel, CRNA Pre-anesthesia Checklist: Patient identified, Patient being monitored, Timeout performed, Emergency Drugs available and Suction available Patient Re-evaluated:Patient Re-evaluated prior to induction Oxygen Delivery Method: Circle system utilized Preoxygenation: Pre-oxygenation with 100% oxygen Induction Type: IV induction and Rapid sequence Laryngoscope Size: 3 and McGraph Grade View: Grade I Tube type: Oral Tube size: 7.5 mm Number of attempts: 1 Airway Equipment and Method: Stylet Placement Confirmation: ETT inserted through vocal cords under direct vision,  positive ETCO2 and breath sounds checked- equal and bilateral Secured at: 21 cm Tube secured with: Tape Dental Injury: Teeth and Oropharynx as per pre-operative assessment

## 2019-01-05 NOTE — Anesthesia Preprocedure Evaluation (Signed)
Anesthesia Evaluation  Patient identified by MRN, date of birth, ID band Patient awake    Reviewed: Allergy & Precautions, H&P , NPO status , [name] Chart, lab work & pertinent test results, reviewed documented beta blocker date and time   Airway Mallampati: III  TM Distance: >3 FB Neck ROM: full    Dental  (+) Teeth Intact   Pulmonary shortness of breath and with exertion, asthma , sleep apnea , COPD,  COPD inhaler,    Pulmonary exam normal        Cardiovascular Exercise Tolerance: Poor hypertension, On Medications + Peripheral Vascular Disease and +CHF  Normal cardiovascular exam+ dysrhythmias  Rhythm:regular Rate:Normal     Neuro/Psych Anxiety negative psych ROS   GI/Hepatic Neg liver ROS, GERD  Medicated,  Endo/Other  negative endocrine ROS  Renal/GU negative Renal ROS  negative genitourinary   Musculoskeletal   Abdominal   Peds  Hematology negative hematology ROS (+)   Anesthesia Other Findings Past Medical History: No date: Anxiety     Comment:  situational No date: BPH (benign prostatic hyperplasia)     Comment:  on flomax No date: Bundle branch block     Comment:  per prior PCP records No date: CHF (congestive heart failure) ([facility]) No date: COPD (chronic obstructive pulmonary disease) ([facility]) No date: Diverticulosis     Comment:  by colonoscopy No date: Eczema     Comment:  per prior PCP records 2015: Elevated PSA     Comment:  peaked 6s, s/p benign biopsy 2014, sees urology [name]               ([place]) No date: Essential tremor No date: GERD (gastroesophageal reflux disease)     Comment:  per prior PCP records No date: History of panic attacks     Comment:  per prior PCP records No date: HTN (hypertension) No date: Mild intermittent asthma in adult without complication No date: Obesity, Class I, BMI 30-34.9 No date: Osteoporosis     Comment:  DEXA 06/2013 with osteopenia - h/o ?wrist/hip  fracture               from AVN from chronic steroid use (asthma), took reclast               for 1 year 2011: Renal artery stenosis in 1 of 2 vessels ([place])     Comment:  by [place] No date: Seasonal allergies No date: Sleep apnea childhood: Thoracic scoliosis No date: Transaminitis     Comment:  per prior PCP records Past Surgical History: [id number]: APPLICATION OF WOUND VAC; Left     Comment:  Procedure: Application Of Wound Vac;  Surgeon: Newt Minion, MD;  Location: [facility];  Service: Orthopedics;                Laterality: Left; 12/2013: COLONOSCOPY     Comment:  polyps, int hem, diverticulosis, rpt 5 yrs [name]) 2008: ELBOW SURGERY; Right     Comment:  golfer's elbow childhood & ~1995: INGUINAL HERNIA REPAIR; Bilateral 2013: NASAL SEPTUM SURGERY     Comment:  deviated septum 05/09/2018: ORIF ANKLE FRACTURE; Left     Comment:  Procedure: OPEN REDUCTION INTERNAL FIXATION LEFT               LISFRANC FRACTURE/DISLOCATION;  Surgeon: Newt Minion,               MD;  Location: [facility];  Service: Orthopedics;  Laterality:              Left; 02/2009: US ECHOCARDIOGRAPHY     Comment:  WNL, EF >55% Claiborne Billings) 05/2009: US RENAL/AORTA; Right     Comment:  1-59% diameter reduction renal artery, rec rpt 2 yrs BMI    Body Mass Index: 29.57 kg/m     Reproductive/Obstetrics negative OB ROS                             Anesthesia Physical Anesthesia Plan  ASA: IV and emergent  Anesthesia Plan: General ETT   Post-op Pain Management:    Induction:   PONV Risk Score and Plan: 3  Airway Management Planned:   Additional Equipment:   Intra-op Plan:   Post-operative Plan:   Informed Consent: I have reviewed the patients History and Physical, chart, labs and discussed the procedure including the risks, benefits and alternatives for the proposed anesthesia with the patient or authorized representative who has indicated his/her understanding and acceptance.      Dental Advisory Given  Plan Discussed with: CRNA  Anesthesia Plan Comments:         Anesthesia Quick Evaluation

## 2019-01-05 NOTE — ED Triage Notes (Signed)
Pt comes into the ED via EMS from GI doctor with c/o sudden onset RLQ pain that started around 10am today, pt reports that he was given 269mcg of fentanyl in route by EMS. Pt  Is a/ox4 at present. In NAD.

## 2019-01-05 NOTE — ED Notes (Signed)
Patient transported to CT 

## 2019-01-05 NOTE — Consult Note (Addendum)
Name: Louis [name] MRN: [id number] DOB: January 15, 1955    ADMISSION DATE:  01/05/2019 CONSULTATION DATE: 01/05/2019  REFERRING MD : Dr. Lysle Pearl  CHIEF COMPLAINT: Abdominal Pain   BRIEF PATIENT DESCRIPTION:  63 yo male admitted with bowel perforation s/p exploratory laparotomy which revealed perforated viscus requiring graham patch repair pt remained mechanically intubated postop  SIGNIFICANT EVENTS/STUDIES:  01/4: Pt presented to [facility] ER from his outpatient gastroenterology office with sudden onset of severe RLQ abdominal pain  01/4: CTA Abd Pelvis revealed moderate amount of free air within the mid and upper abdomen, with an adjacent collection of fluid and air seen within the anterolateral aspect of the right lower quadrant. This is consistent with bowel perforation. Given the adjacent area of mesenteric inflammatory fat stranding, this is likely located within the proximal duodenum. Sigmoid diverticulosis. Fatty liver. Bilateral simple renal cysts. Enlarged prostate gland. Surgical team consulted pt transported for emergent exploratory laparotomy 01/4: Exploratory laparotomy revealed perforated viscus requiring graham patch repair. Pt admitted to ICU postop mechanically intubated   HISTORY OF PRESENT ILLNESS:   This is a 64 yo male with a PMH of Transaminitis, OSA, Seasonal Allergies, Renal Artery Stenosis, Osteoporosis, Mild Intermittent Asthma, HTN, Panic Attacks, GERD, Essential Tremor, Elevated PSA with Benign Biopsy (2014), COPD, Eczema, Chronic CHF, Bundle Branch Block, BPH, and Anxiety.  He presented to [facility] ER via EMS from his outpatient gastroenterology office with sudden onset of severe right lower quadrant abdominal pain.  Lab results revealed Na+ 119, K+ 6.0, chloride 85, CO2 19, glucose 140, creatinine 1.69, lipase 59, AST 100, ALT 126, troponin 4, and hgb 11.7.  EKG revealed sinus rhythm, hr 80's, and ST depression anteriorly with T wave inversion, however troponin within normal  limits. Influenza PCR and COVID-19 negative. Abd x-ray concerning for free air within the abdomen.  CT Abd Pelvis revealed moderate amount of free air within the mid and upper abdomen consistent with bowel perforation.  Therefore, surgical team consulted and pt transported for emergent exploratory laparotomy which revealed perforated viscus requiring graham patch repair.  He was subsequently admitted to ICU postop mechanically intubated for additional workup and treatment.  PCCM consulted for assistance with management.    PAST MEDICAL HISTORY :   has a past medical history of Anxiety, BPH (benign prostatic hyperplasia), Bundle branch block, CHF (congestive heart failure) ([place]), COPD (chronic obstructive pulmonary disease) ([place]), Diverticulosis, Eczema, Elevated PSA (2015), Essential tremor, GERD (gastroesophageal reflux disease), History of panic attacks, HTN (hypertension), Mild intermittent asthma in adult without complication, Obesity, Class I, BMI 30-34.9, Osteoporosis, Renal artery stenosis in 1 of 2 vessels ([place]) (2011), Seasonal allergies, Sleep apnea, Thoracic scoliosis (childhood), and Transaminitis.  has a past surgical history that includes Nasal septum surgery (2013); Elbow surgery (Right, 2008); Inguinal hernia repair (Bilateral, childhood & ~1995); Colonoscopy (12/2013); US ECHOCARDIOGRAPHY (02/2009); US RENAL/AORTA (Right, 05/2009); ORIF ankle fracture (Left, 07/09/4325); and Application if wound vac (Left, 05/09/2018). Prior to Admission medications   Medication Sig Start Date End Date Taking? Authorizing Provider  ALPRAZolam Duanne Moron) 0.5 MG tablet Take 1 tablet (0.5 mg total) by mouth daily as needed (essential tremor). 11/18/18   Rutherford Guys, MD  amLODipine (NORVASC) 5 MG tablet TAKE 1 TABLET(5 MG) BY MOUTH DAILY 12/22/18   Troy Sine, MD  gabapentin (NEURONTIN) 300 MG capsule TAKE 1 CAPSULE BY MOUTH THREE TIMES DAILY WHEN NECESSARY 11/11/18   Newt Minion, MD  losartan (COZAAR) 100  MG tablet Take 1 tablet (100 mg total) by  mouth daily. NEED OV. 12/29/18   Troy Sine, MD  metoprolol succinate (TOPROL-XL) 50 MG 24 hr tablet TAKE 3 TABLETS BY MOUTH EVERY DAY. TAKE WITH OR IMMEDIATELY FOLLOWING A MEAL 12/22/18   Troy Sine, MD  sertraline (ZOLOFT) 25 MG tablet Take 1 tablet (25 mg total) by mouth daily. Start with 1/2 tablet once a day for a week, then increase to 1 tab daily 12/01/18   Rutherford Guys, MD  spironolactone (ALDACTONE) 25 MG tablet TAKE 1 TABLET BY MOUTH EVERY DAY 10/14/18   Troy Sine, MD   Allergies  Allergen Reactions  . Accupril [Quinapril Hcl] Cough    FAMILY HISTORY:  family history includes Alcohol abuse in his mother; Ataxia in his brother and sister; Cancer in his maternal grandmother; Cancer (age of onset: 92) in his father; Diabetes in his mother; Prostate cancer in his father; Stroke in his mother. SOCIAL HISTORY:  reports that he has never smoked. He has never used smokeless tobacco. He reports current alcohol use of about 14.0 standard drinks of alcohol per week. He reports that he does not use drugs.  REVIEW OF SYSTEMS:   Unable to assess pt mechanically intubated  SUBJECTIVE:  Unable to assess pt mechanically intubated   VITAL SIGNS: Temp:  [98.9 F (37.2 C)] 98.9 F (37.2 C) (01/04 1643) Pulse Rate:  [81-89] 82 (01/04 2030) Resp:  [16-31] 22 (01/04 2100) BP: (112-140)/(60-109) 125/73 (01/04 2100) SpO2:  [93 %-98 %] 98 % (01/04 2030) Weight:  [96.2 kg] 96.2 kg (01/04 1644)  PHYSICAL EXAMINATION: General: acutely ill appearing male, NAD mechanically intubated  Neuro: sedated, opens eyes sponaneously, shakes head yes and no appropriately, PERRL  HEENT: supple, no JVD  Cardiovascular: nsr, rrr, no R/G  Lungs: faint rhonchi throughout, even, non labored  Abdomen: faint BS present LLQ, absent BS throughout other quadrants, JP drain in place with sanguinous  Musculoskeletal: normal bulk and tone, no edema Skin: midline  abdominal incision well approximated with honeycomb dressing intact   Recent Labs  Lab 01/05/19 1654  NA 119*  K 6.0*  CL 85*  CO2 19*  BUN 21  CREATININE 1.69*  GLUCOSE 140*   Recent Labs  Lab 01/05/19 1654  HGB 11.7*  HCT 33.2*  WBC 4.3  PLT 250   CT Abdomen Pelvis W Contrast  Result Date: 01/05/2019 CLINICAL DATA:  Diffuse abdominal pain. EXAM: CT ABDOMEN AND PELVIS WITH CONTRAST TECHNIQUE: Multidetector CT imaging of the abdomen and pelvis was performed using the standard protocol following bolus administration of intravenous contrast. CONTRAST:  175m OMNIPAQUE IOHEXOL 300 MG/ML  SOLN COMPARISON:  None. FINDINGS: Lower chest: No acute abnormality. Hepatobiliary: No focal liver abnormality is seen. Diffuse fatty infiltration of the liver parenchyma is noted. No gallstones, gallbladder wall thickening, or biliary dilatation. Pancreas: Unremarkable. No pancreatic ductal dilatation or surrounding inflammatory changes. Spleen: Normal in size without focal abnormality. Adrenals/Urinary Tract: Adrenal glands are unremarkable. Kidneys are normal in size, without renal calculi. Mild right-sided hydronephrosis is noted without evidence of renal obstruction. Bilateral simple renal cysts are seen. The largest on the left measures approximately 4.7 cm in diameter, while the largest on the right measures approximately 3.4 cm in diameter. The urinary bladder is partially contracted. Mild diffuse urinary bladder wall thickening is seen. Stomach/Bowel: A moderate amount of free air is seen within the anterior aspect of the mid and upper abdomen. A 1.9 cm curvilinear hyperdense focus is seen within the right upper quadrant. This is adjacent  to the proximal duodenum, just below the anterolateral aspect of the right lobe of the liver, and resembles contrast material (axial CT images 34 and 35, CT series number 2). A mild amount of inflammatory fat stranding is seen within this region. Stomach is within normal  limits. Appendix appears normal. No evidence of bowel wall thickening or distention. Noninflamed diverticula are seen throughout the sigmoid colon. Vascular/Lymphatic: No significant vascular findings are present. No enlarged abdominal or pelvic lymph nodes. Reproductive: The prostate gland is moderately enlarged. Other: An 11.9 cm x 3.7 cm well-defined collection of fluid and air is seen within the anterolateral aspect of the right lower quadrant. This appears to be contiguous with the previously described area of free air seen within the upper abdomen. No adjacent bowel loops are seen. There is a very small amount of posterior pelvic free fluid. Musculoskeletal: There is marked severity dextroscoliosis of the lower thoracic spine and moderate severity levoscoliosis of the lower lumbar spine with multilevel degenerative changes. IMPRESSION: 1. Moderate amount of free air within the mid and upper abdomen, with an adjacent collection of fluid and air seen within the anterolateral aspect of the right lower quadrant. This is consistent with bowel perforation. Given the adjacent area of mesenteric inflammatory fat stranding, this is likely located within the proximal duodenum. 2. Sigmoid diverticulosis. 3. Fatty liver. 4. Bilateral simple renal cysts. 5. Enlarged prostate gland. Electronically Signed   By: Virgina Norfolk M.D.   On: 01/05/2019 20:13   DG Abd Acute W/Chest  Result Date: 01/05/2019 CLINICAL DATA:  Right lower quadrant pain for several hours, initial encounter EXAM: DG ABDOMEN ACUTE W/ 1V CHEST COMPARISON:  None. FINDINGS: Cardiac shadows within normal limits. Significant scoliosis of the thoracolumbar spine concave to the left is noted. There are changes consistent with free air underneath the hemidiaphragms bilaterally. Mild vascular congestion is noted. No focal infiltrate is seen. Scattered large and small bowel gas is noted. Free intraperitoneal air is noted beneath the diaphragms. No acute bony  abnormality is noted. IMPRESSION: Changes consistent with free air within the abdomen. This would be better evaluated with CT of the abdomen and pelvis which is pending. Electronically Signed   By: Inez Catalina M.D.   On: 01/05/2019 19:27    ASSESSMENT / PLAN:  Acute respiratory failure secondary to sedating medication during exploratory laparotomy remained mechanically intubated postop Hx: OSA, COPD, Obesity, and Mild Intermittent Asthma  Full vent support for now-vent settings reviewed and established  SBT once all parameters met  VAP bundle implemented Prn bronchodilator therapy   Hypotension secondary to sepsis and hypovolemia in setting of perforated viscus  Hx: HTN, Bundle Branch Clock, and Chronic Diastolic CHF Continuous telemetry monitoring  Aggressive fluid resuscitation to maintain map >65, if pt becomes hypotensive despite fluid resuscitation will start levophed gtt  Hold outpatient antihypertensives for now   Acute renal failure with hyperkalemia secondary to septic and hypovolemic shock  Severe hyponatremia secondary to hypovolemia  Trend BMP Replace electrolytes as indicated  Monitor UOP Avoid nephrotoxic medications  Aggressive fluid resuscitation   Perforated viscus requiring graham patch repair (01/04/18)  Trend WBC and monitor fever curve  Trend PCT and lactic acid Follow cultures  Continue zosyn Surgical team primary-keep NPO per recommendations  NG tube LIS   Transaminitis secondary to sepsis  Trend hepatic panel   Anemia  Trend CBC  Monitor for s/sx of bleeding  Transfuse for hgb <7  Hyperglycemia  CBG's q4hrs  SSI   Postop pain  Mechanical intubation pain/discomfort  Maintain RASS goal -1 to -2 for now  Fentanyl gtt to maintain RASS goal and for pain management  WUA daily  Hold outpatient xanax, zoloft, and gabapentin  Best Practice: VTE px: SCD's  SUP px: iv protonix  Diet:keep NPO for now   Updated pts wife via telephone regarding plan  of care and all questions answered.  Marda Stalker, [place] Pager [phone] (please enter 7 digits) PCCM Consult Pager [phone] (please enter 7 digits)

## 2019-01-05 NOTE — ED Notes (Signed)
Pt reports abd pain since 10am today.  Pt brought in by ems from doctor's office.  Iv already in place.  Iv fluids now infusing and meds given.  Pt reports drinking wine every day.  No n/v/d  Pt alert speech clear.  siderails up x 2.

## 2019-01-05 NOTE — Transfer of Care (Signed)
Immediate Anesthesia Transfer of Care Note  Patient: Louis [name]  Procedure(s) Performed: EXPLORATORY LAPAROTOMY graham patch repair (N/A Abdomen)  Patient Location: PACU and ICU  Anesthesia Type:General  Level of Consciousness: Patient remains intubated per anesthesia plan  Airway & Oxygen Therapy: Patient remains intubated per anesthesia plan  Post-op Assessment: Report given to RN and Post -op Vital signs reviewed and stable  Post vital signs: Reviewed and stable  Last Vitals:  Vitals Value Taken Time  BP 158/96 01/05/19 2347  Temp 37.3 C 01/05/19 2347  Pulse 87 01/05/19 2353  Resp 22 01/05/19 2353  SpO2 100 % 01/05/19 2353  Vitals shown include unvalidated device data.  Last Pain:  Vitals:   01/05/19 2347  TempSrc: Oral  PainSc:          Complications: No apparent anesthesia complications

## 2019-01-05 NOTE — ED Notes (Signed)
Surgeon in with pt now.

## 2019-01-05 NOTE — H&P (Addendum)
Subjective:   CC: Pneumoperitoneum  HPI:  Louis [name] is a 64 y.o. male who was consulted by Ellender Hose for issue above.  Symptoms were first noted several hours ago. Pain is sharp, initially located in epigastric region, but now has migrated towards the left lower quadrant..  Associated with nothing specific, exacerbated by nothing specific   Past Medical History:  has a past medical history of Anxiety, BPH (benign prostatic hyperplasia), Bundle branch block, CHF (congestive heart failure) ([place]), COPD (chronic obstructive pulmonary disease) ([place]), Diverticulosis, Eczema, Elevated PSA (2015), Essential tremor, GERD (gastroesophageal reflux disease), History of panic attacks, HTN (hypertension), Mild intermittent asthma in adult without complication, Obesity, Class I, BMI 30-34.9, Osteoporosis, Renal artery stenosis in 1 of 2 vessels ([place]) (2011), Seasonal allergies, Sleep apnea, Thoracic scoliosis (childhood), and Transaminitis.  Past Surgical History:  Past Surgical History:  Procedure Laterality Date  . APPLICATION OF WOUND VAC Left 05/09/2018   Procedure: Application Of Wound Vac;  Surgeon: Newt Minion, MD;  Location: [place];  Service: Orthopedics;  Laterality: Left;  . COLONOSCOPY  12/2013   polyps, int hem, diverticulosis, rpt 5 yrs ([place])  . ELBOW SURGERY Right 2008   golfer's elbow  . INGUINAL HERNIA REPAIR Bilateral childhood & ~1995  . NASAL SEPTUM SURGERY  2013   deviated septum  . ORIF ANKLE FRACTURE Left 05/09/2018   Procedure: OPEN REDUCTION INTERNAL FIXATION LEFT LISFRANC FRACTURE/DISLOCATION;  Surgeon: Newt Minion, MD;  Location: [place];  Service: Orthopedics;  Laterality: Left;  . US ECHOCARDIOGRAPHY  02/2009   WNL, EF >55% [facility])  . US RENAL/AORTA Right 05/2009   1-59% diameter reduction renal artery, rec rpt 2 yrs    Family History: family history includes Alcohol abuse in his mother; Ataxia in his brother and sister; Cancer in his maternal grandmother; Cancer (age of  onset: 40) in his father; Diabetes in his mother; Prostate cancer in his father; Stroke in his mother.  Social History:  reports that he has never smoked. He has never used smokeless tobacco. He reports current alcohol use of about 14.0 standard drinks of alcohol per week. He reports that he does not use drugs.  Current Medications: (Not in a hospital admission)   Allergies:  Allergies as of 01/05/2019 - Review Complete 01/05/2019  Allergen Reaction Noted  . Accupril [quinapril hcl] Cough 10/08/2014    ROS:  General: Denies weight loss, weight gain, fatigue, fevers, chills, and night sweats. Eyes: Denies blurry vision, double vision, eye pain, itchy eyes, and tearing. Ears: Denies hearing loss, earache, and ringing in ears. Nose: Denies sinus pain, congestion, infections, runny nose, and nosebleeds. Mouth/throat: Denies hoarseness, sore throat, bleeding gums, and difficulty swallowing. Heart: Denies chest pain, palpitations, racing heart, irregular heartbeat, leg pain or swelling, and decreased activity tolerance. Respiratory: Denies breathing difficulty, shortness of breath, wheezing, cough, and sputum. GI: Denies change in appetite, heartburn, nausea, vomiting, constipation, diarrhea, and blood in stool. GU: Denies difficulty urinating, pain with urinating, urgency, frequency, blood in urine. Musculoskeletal: Denies joint stiffness, pain, swelling, muscle weakness. Skin: Denies rash, itching, mass, tumors, sores, and boils Neurologic: Denies headache, fainting, dizziness, seizures, numbness, and tingling. Psychiatric: Denies depression, anxiety, difficulty sleeping, and memory loss. Endocrine: Denies heat or cold intolerance, and increased thirst or urination. Blood/lymph: Denies easy bruising, easy bruising, and swollen glands     Objective:     BP (!) 140/109   Pulse 83   Temp 98.9 F (37.2 C) (Oral)   Resp (!) 25  Ht 5\' 11"  (1.803 m)   Wt 96.2 kg   SpO2 97%   BMI  29.57 kg/m   Constitutional :  alert, cooperative, appears stated age and no distress  Lymphatics/Throat:  no asymmetry, masses, or scars  Respiratory:  clear to auscultation bilaterally  Cardiovascular:  regular rate and rhythm  Gastrointestinal: Soft, no guarding, tenderness to palpation in epigastric and left lower quadrant.   Musculoskeletal: Steady movement  Skin: Cool and moist  Psychiatric: Normal affect, non-agitated, not confused       LABS:  CMP Latest Ref Rng & Units 01/05/2019 11/07/2017 07/08/2017  Glucose 70 - 99 mg/dL 140(H) 122(H) 108(H)  BUN 8 - 23 mg/dL 21 14 16   Creatinine 0.61 - 1.24 mg/dL 1.69(H) 0.93 0.78  Sodium 135 - 145 mmol/L 119(LL) 138 136  Potassium 3.5 - 5.1 mmol/L 6.0(H) 5.0 4.5  Chloride 98 - 111 mmol/L 85(L) 96 89(L)  CO2 22 - 32 mmol/L 19(L) 23 41(H)  Calcium 8.9 - 10.3 mg/dL 9.7 9.9 9.4  Total Protein 6.5 - 8.1 g/dL 7.4 - -  Total Bilirubin 0.3 - 1.2 mg/dL 1.2 - -  Alkaline Phos 38 - 126 U/L 58 - -  AST 15 - 41 U/L 100(H) - -  ALT 0 - 44 U/L 126(H) - -   CBC Latest Ref Rng & Units 01/05/2019 07/04/2017 07/03/2017  WBC 4.0 - 10.5 K/uL 4.3 7.2 5.5  Hemoglobin 13.0 - 17.0 g/dL 11.7(L) 16.5 16.2  Hematocrit 39.0 - 52.0 % 33.2(L) 52.6(H) 52.1(H)  Platelets 150 - 400 K/uL 250 237 232    RADS: CLINICAL DATA:  Diffuse abdominal pain.  EXAM: CT ABDOMEN AND PELVIS WITH CONTRAST  TECHNIQUE: Multidetector CT imaging of the abdomen and pelvis was performed using the standard protocol following bolus administration of intravenous contrast.  CONTRAST:  117mL OMNIPAQUE IOHEXOL 300 MG/ML  SOLN  COMPARISON:  None.  FINDINGS: Lower chest: No acute abnormality.  Hepatobiliary: No focal liver abnormality is seen. Diffuse fatty infiltration of the liver parenchyma is noted. No gallstones, gallbladder wall thickening, or biliary dilatation.  Pancreas: Unremarkable. No pancreatic ductal dilatation or surrounding inflammatory changes.  Spleen:  Normal in size without focal abnormality.  Adrenals/Urinary Tract: Adrenal glands are unremarkable. Kidneys are normal in size, without renal calculi. Mild right-sided hydronephrosis is noted without evidence of renal obstruction. Bilateral simple renal cysts are seen. The largest on the left measures approximately 4.7 cm in diameter, while the largest on the right measures approximately 3.4 cm in diameter. The urinary bladder is partially contracted. Mild diffuse urinary bladder wall thickening is seen.  Stomach/Bowel: A moderate amount of free air is seen within the anterior aspect of the mid and upper abdomen.  A 1.9 cm curvilinear hyperdense focus is seen within the right upper quadrant. This is adjacent to the proximal duodenum, just below the anterolateral aspect of the right lobe of the liver, and resembles contrast material (axial CT images 34 and 35, CT series number 2). A mild amount of inflammatory fat stranding is seen within this region.  Stomach is within normal limits. Appendix appears normal. No evidence of bowel wall thickening or distention. Noninflamed diverticula are seen throughout the sigmoid colon.  Vascular/Lymphatic: No significant vascular findings are present. No enlarged abdominal or pelvic lymph nodes.  Reproductive: The prostate gland is moderately enlarged.  Other: An 11.9 cm x 3.7 cm well-defined collection of fluid and air is seen within the anterolateral aspect of the right lower quadrant. This appears to be  contiguous with the previously described area of free air seen within the upper abdomen. No adjacent bowel loops are seen. There is a very small amount of posterior pelvic free fluid.  Musculoskeletal: There is marked severity dextroscoliosis of the lower thoracic spine and moderate severity levoscoliosis of the lower lumbar spine with multilevel degenerative changes.  IMPRESSION: 1. Moderate amount of free air within the mid and  upper abdomen, with an adjacent collection of fluid and air seen within the anterolateral aspect of the right lower quadrant. This is consistent with bowel perforation. Given the adjacent area of mesenteric inflammatory fat stranding, this is likely located within the proximal duodenum. 2. Sigmoid diverticulosis. 3. Fatty liver. 4. Bilateral simple renal cysts. 5. Enlarged prostate gland.   Electronically Signed   By: Virgina Norfolk M.D.   On: 01/05/2019 20:13 Assessment:   Perforated viscus, likely duodenum  Plan:   Recommended surgical repair at this time before further decline in health.  No alternatives to treatment.  The risk of surgery include, but not limited to, recurrence, bleeding, chronic pain, post-op infxn, post-op SBO or ileus, hernias, resection of bowel, re-anastamosis, and need for re-operation to address said risks. The risks of general anesthetic, if used, includes MI, CVA, sudden death or even reaction to anesthetic medications also discussed. Benefits include possible symptom relief, preventing further decline in health and possible death.  Typical post-op recovery time of additional days in hospital for observation afterwards also discussed.  The patient and wife via phone verbalized understanding and all questions were answered to the patient's satisfaction.  EKG read noted, but symptoms do not match acute MI, discussed with ED provider and agreed addressing perforation takes precedence over continued workup for possible overread by machine.  Will proceed as planned.    Notified of consult at 1936, seen and plan determined by 2020

## 2019-01-05 NOTE — ED Notes (Signed)
Report called to brandy rn OR nurse.

## 2019-01-05 NOTE — ED Provider Notes (Signed)
[facility] Emergency Department Provider Note   ____________________________________________   First MD Initiated Contact with Patient 01/05/19 1820     (approximate)  I have reviewed the triage vital signs and the nursing notes.   HISTORY  Chief Complaint Abdominal Pain    HPI Louis [name] is a 64 y.o. male with past medical history of hypertension, diastolic CHF, restrictive lung disease, pulmonary hypertension who presents to the ED complaining of abdominal pain.  Patient reports he had acute onset of epigastric abdominal pain around 10:00 this morning.  Pain has been constant since then, described as sharp.  It has seemed to spread to the rest of his abdomen diffusely, although is worst at his right lower quadrant.  It is exacerbated by any movement, but he denies any nausea, vomiting, diarrhea, or constipation.  He has not had any fevers, flank pain, dysuria, or hematuria.  He denies any similar pain in the past, does report a history of diverticulosis but denies history of PUD or appendectomy.        Past Medical History:  Diagnosis Date  . Anxiety    situational  . BPH (benign prostatic hyperplasia)    on flomax  . Bundle branch block    per prior PCP records  . CHF (congestive heart failure) ([place])   . COPD (chronic obstructive pulmonary disease) ([place])   . Diverticulosis    by colonoscopy  . Eczema    per prior PCP records  . Elevated PSA 2015   peaked 6s, s/p benign biopsy 2014, sees urology [name] ([place])  . Essential tremor   . GERD (gastroesophageal reflux disease)    per prior PCP records  . History of panic attacks    per prior PCP records  . HTN (hypertension)   . Mild intermittent asthma in adult without complication   . Obesity, Class I, BMI 30-34.9   . Osteoporosis    DEXA 06/2013 with osteopenia - h/o ?wrist/hip fracture from AVN from chronic steroid use (asthma), took reclast for 1 year  . Renal artery stenosis in 1  of 2 vessels ([place]) 2011   by [place]  . Seasonal allergies   . Sleep apnea   . Thoracic scoliosis childhood  . Transaminitis    per prior PCP records    Patient Active Problem List   Diagnosis Date Noted  . Onychomycosis 05/19/2018  . Foot fracture, left, closed, initial encounter 05/09/2018  . Lisfranc dislocation, left, initial encounter   . Acute bronchitis 02/25/2018  . Primary pulmonary hypertension ([place]) 07/09/2017  . Diastolic dysfunction [id number]  . OSA (obstructive sleep apnea) 07/08/2017  . SOB (shortness of breath) 07/02/2017  . Chronic respiratory failure with hypoxia and hypercapnia ([place]) 07/02/2017  . Cardiomegaly 07/02/2017  . Exposure keratitis 02/17/2017  . Pedal edema 12/18/2016  . Transaminitis 12/18/2016  . Bilateral pes planus 12/18/2016  . Psoriasis-eczema overlap condition 07/30/2016  . High serum high density lipoprotein (HDL) 09/26/2015  . Epistaxis 09/26/2015  . Renal artery stenosis in 1 of 2 vessels ([place])   . Essential tremor   . Elevated PSA   . GERD (gastroesophageal reflux disease)   . Health maintenance examination 10/08/2014  . Anxiety state 10/08/2014  . Obesity, Class I, BMI 30-34.9   . Mild intermittent asthma in adult without complication   . Seasonal allergies   . HTN (hypertension)   . Scoliosis   . Osteoporosis     Past Surgical History:  Procedure Laterality Date  .  APPLICATION OF WOUND VAC Left 05/09/2018   Procedure: Application Of Wound Vac;  Surgeon: Newt Minion, MD;  Location: [place];  Service: Orthopedics;  Laterality: Left;  . COLONOSCOPY  12/2013   polyps, int hem, diverticulosis, rpt 5 yrs ([place])  . ELBOW SURGERY Right 2008   golfer's elbow  . INGUINAL HERNIA REPAIR Bilateral childhood & ~1995  . NASAL SEPTUM SURGERY  2013   deviated septum  . ORIF ANKLE FRACTURE Left 05/09/2018   Procedure: OPEN REDUCTION INTERNAL FIXATION LEFT LISFRANC FRACTURE/DISLOCATION;  Surgeon: Newt Minion, MD;  Location: [place];  Service:  Orthopedics;  Laterality: Left;  . US ECHOCARDIOGRAPHY  02/2009   WNL, EF >55% Claiborne Billings)  . US RENAL/AORTA Right 05/2009   1-59% diameter reduction renal artery, rec rpt 2 yrs    Prior to Admission medications   Medication Sig Start Date End Date Taking? Authorizing Provider  ALPRAZolam Duanne Moron) 0.5 MG tablet Take 1 tablet (0.5 mg total) by mouth daily as needed (essential tremor). 11/18/18   Rutherford Guys, MD  amLODipine (NORVASC) 5 MG tablet TAKE 1 TABLET(5 MG) BY MOUTH DAILY 12/22/18   Troy Sine, MD  gabapentin (NEURONTIN) 300 MG capsule TAKE 1 CAPSULE BY MOUTH THREE TIMES DAILY WHEN NECESSARY 11/11/18   Newt Minion, MD  losartan (COZAAR) 100 MG tablet Take 1 tablet (100 mg total) by mouth daily. NEED OV. 12/29/18   Troy Sine, MD  metoprolol succinate (TOPROL-XL) 50 MG 24 hr tablet TAKE 3 TABLETS BY MOUTH EVERY DAY. TAKE WITH OR IMMEDIATELY FOLLOWING A MEAL 12/22/18   Troy Sine, MD  sertraline (ZOLOFT) 25 MG tablet Take 1 tablet (25 mg total) by mouth daily. Start with 1/2 tablet once a day for a week, then increase to 1 tab daily 12/01/18   Rutherford Guys, MD  spironolactone (ALDACTONE) 25 MG tablet TAKE 1 TABLET BY MOUTH EVERY DAY 10/14/18   Troy Sine, MD    Allergies Accupril [place] Canal hcl]  Family History  Problem Relation Age of Onset  . Cancer Father 8       colon  . Prostate cancer Father   . Diabetes Mother   . Alcohol abuse Mother   . Stroke Mother   . Cancer Maternal Grandmother        pancreatic  . Ataxia Brother   . Ataxia Sister   . CAD Neg Hx     Social History Social History   Tobacco Use  . Smoking status: Never Smoker  . Smokeless tobacco: Never Used  Substance Use Topics  . Alcohol use: Yes    Alcohol/week: 14.0 standard drinks    Types: 14 Glasses of wine per week    Comment: Social  . Drug use: No    Review of Systems  Constitutional: No fever/chills Eyes: No visual changes. ENT: No sore throat. Cardiovascular:  Denies chest pain. Respiratory: Denies shortness of breath. Gastrointestinal: Positive for abdominal pain.  No nausea, no vomiting.  No diarrhea.  No constipation. Genitourinary: Negative for dysuria. Musculoskeletal: Negative for back pain. Skin: Negative for rash. Neurological: Negative for headaches, focal weakness or numbness.  ____________________________________________   PHYSICAL EXAM:  VITAL SIGNS: ED Triage Vitals  Enc Vitals Group     BP 01/05/19 1643 112/60     Pulse Rate 01/05/19 1643 81     Resp 01/05/19 1643 16     Temp 01/05/19 1643 98.9 F (37.2 C)     Temp Source 01/05/19 1643 Oral  SpO2 01/05/19 1643 96 %     Weight 01/05/19 1644 212 lb (96.2 kg)     Height 01/05/19 1644 5\' 11"  (1.803 m)     Head Circumference --      Peak Flow --      Pain Score 01/05/19 1644 8     Pain Loc --      Pain Edu? --      Excl. in [place]? --     Constitutional: Alert and oriented. Eyes: Conjunctivae are normal. Head: Atraumatic. Nose: No congestion/rhinnorhea. Mouth/Throat: Mucous membranes are moist. Neck: Normal ROM Cardiovascular: Tachycardic, regular rhythm. Grossly normal heart sounds. Respiratory: Tachypneic with normal respiratory effort.  No retractions. Lungs CTAB. Gastrointestinal: Firm distended abdomen with diffuse tenderness to light palpation. Genitourinary: deferred Musculoskeletal: No lower extremity tenderness nor edema. Neurologic:  Normal speech and language. No gross focal neurologic deficits are appreciated. Skin:  Skin is warm, dry and intact. No rash noted. Psychiatric: Mood and affect are normal. Speech and behavior are normal.  ____________________________________________   LABS (all labs ordered are listed, but only abnormal results are displayed)  Labs Reviewed  LIPASE, BLOOD - Abnormal; Notable for the following components:      Result Value   Lipase 59 (*)    All other components within normal limits  COMPREHENSIVE METABOLIC PANEL -  Abnormal; Notable for the following components:   Sodium 119 (*)    Potassium 6.0 (*)    Chloride 85 (*)    CO2 19 (*)    Glucose, Bld 140 (*)    Creatinine, Ser 1.69 (*)    AST 100 (*)    ALT 126 (*)    GFR calc non Af Amer 43 (*)    GFR calc Af Amer 50 (*)    All other components within normal limits  CBC - Abnormal; Notable for the following components:   RBC 3.35 (*)    Hemoglobin 11.7 (*)    HCT 33.2 (*)    MCH 34.9 (*)    All other components within normal limits  URINALYSIS, COMPLETE (UACMP) WITH MICROSCOPIC - Abnormal; Notable for the following components:   Color, Urine YELLOW (*)    APPearance CLOUDY (*)    Glucose, UA 50 (*)    Hgb urine dipstick SMALL (*)    Ketones, ur 5 (*)    Protein, ur 100 (*)    Bacteria, UA RARE (*)    All other components within normal limits  GLUCOSE, CAPILLARY - Abnormal; Notable for the following components:   Glucose-Capillary 154 (*)    All other components within normal limits  RESPIRATORY PANEL BY RT PCR (FLU A&B, COVID)  CULTURE, BLOOD (ROUTINE X 2)  CULTURE, BLOOD (ROUTINE X 2)  MRSA PCR SCREENING  CBC  BASIC METABOLIC PANEL  MAGNESIUM  PHOSPHORUS  BLOOD GAS, ARTERIAL  TRIGLYCERIDES  TYPE AND SCREEN  SURGICAL PATHOLOGY  TROPONIN I (HIGH SENSITIVITY)  TROPONIN I (HIGH SENSITIVITY)   ____________________________________________  EKG  ED ECG REPORT I, Blake Divine, the attending physician, personally viewed and interpreted this ECG.   Date: 01/05/2019  EKG Time: 19:32  Rate: 85  Rhythm: normal sinus rhythm  Axis: Normal  Intervals:none  ST&T Change: ST depressions anteriorly with associated T wave inversions   PROCEDURES  Procedure(s) performed (including Critical Care):  .Critical Care Performed by: Blake Divine, MD Authorized by: Blake Divine, MD   Critical care provider statement:    Critical care time (minutes):  45   Critical care  time was exclusive of:  Separately billable procedures and  treating other patients and teaching time   Critical care was necessary to treat or prevent imminent or life-threatening deterioration of the following conditions:  Sepsis and metabolic crisis   Critical care was time spent personally by me on the following activities:  Discussions with consultants, evaluation of patient's response to treatment, examination of patient, ordering and performing treatments and interventions, ordering and review of laboratory studies, ordering and review of radiographic studies, pulse oximetry, re-evaluation of patient's condition, obtaining history from patient or surrogate and review of old charts   I assumed direction of critical care for this patient from another provider in my specialty: no       ____________________________________________   INITIAL IMPRESSION / ASSESSMENT AND PLAN / ED COURSE       64 year old male presents to the ED with acute onset upper abdominal pain around 10 AM this morning, has since spread to his abdomen diffusely and become increasingly severe.  His abdominal exam is concerning for peritonitis, plan to obtain acute abdominal x-ray as well as CT scan.  Lab work completed from triage also shows hyponatremia and hyperkalemia with mild AKI.  It is possible that these electrolyte abnormalities are related to an intra-abdominal process, will hydrate with IV NS for now to address both his sodium and potassium.    Abdominal x-ray shows free air under the diaphragm, CT scan is pending.  We will start Zosyn and Flagyl, case discussed with Dr. Lysle Pearl of general surgery,, will evaluate the patient in the ED.  EKG was obtained and shows no changes related to hyperkalemia, no indication for calcium.  It is also read by the machine to represent acute posterior MI.  I doubt his ST depressions represent acute MI as patient has no chest pain or other anginal equivalent, all of his pain is in his abdomen and reproduced by palpation.  Troponin noted to be  within normal limits and given constant symptoms since 10 AM, low suspicion for MI and further management of his perforated bowel appears to be the top priority.  Patient transported to the OR for further management of perforated bowel, remains in stable condition.       ____________________________________________   FINAL CLINICAL IMPRESSION(S) / ED DIAGNOSES  Final diagnoses:  Perforated bowel ([place])  Hyponatremia  Hyperkalemia     ED Discharge Orders    None       Note:  This document was prepared using Dragon voice recognition software and may include unintentional dictation errors.   Blake Divine, MD 01/05/19 2355

## 2019-01-06 ENCOUNTER — Inpatient Hospital Stay: Payer: 59

## 2019-01-06 ENCOUNTER — Telehealth: Payer: PRIVATE HEALTH INSURANCE | Admitting: Family Medicine

## 2019-01-06 DIAGNOSIS — R6521 Severe sepsis with septic shock: Secondary | ICD-10-CM | POA: Diagnosis present

## 2019-01-06 DIAGNOSIS — E871 Hypo-osmolality and hyponatremia: Secondary | ICD-10-CM | POA: Diagnosis present

## 2019-01-06 DIAGNOSIS — A419 Sepsis, unspecified organism: Secondary | ICD-10-CM | POA: Diagnosis present

## 2019-01-06 DIAGNOSIS — E872 Acidosis, unspecified: Secondary | ICD-10-CM | POA: Diagnosis present

## 2019-01-06 DIAGNOSIS — N179 Acute kidney failure, unspecified: Secondary | ICD-10-CM | POA: Diagnosis present

## 2019-01-06 DIAGNOSIS — E875 Hyperkalemia: Secondary | ICD-10-CM | POA: Diagnosis present

## 2019-01-06 DIAGNOSIS — R198 Other specified symptoms and signs involving the digestive system and abdomen: Secondary | ICD-10-CM | POA: Diagnosis present

## 2019-01-06 LAB — BLOOD GAS, ARTERIAL
Acid-base deficit: 3.4 mmol/L — ABNORMAL HIGH (ref 0.0–2.0)
Bicarbonate: 21.4 mmol/L (ref 20.0–28.0)
FIO2: 0.4
MECHVT: 500 mL
O2 Saturation: 98.8 %
PEEP: 5 cmH2O
Patient temperature: 37
RATE: 22 resp/min
pCO2 arterial: 37 mmHg (ref 32.0–48.0)
pH, Arterial: 7.37 (ref 7.350–7.450)
pO2, Arterial: 126 mmHg — ABNORMAL HIGH (ref 83.0–108.0)

## 2019-01-06 LAB — COMPREHENSIVE METABOLIC PANEL
ALT: 101 U/L — ABNORMAL HIGH (ref 0–44)
AST: 93 U/L — ABNORMAL HIGH (ref 15–41)
Albumin: 3.3 g/dL — ABNORMAL LOW (ref 3.5–5.0)
Alkaline Phosphatase: 33 U/L — ABNORMAL LOW (ref 38–126)
Anion gap: 11 (ref 5–15)
BUN: 21 mg/dL (ref 8–23)
CO2: 20 mmol/L — ABNORMAL LOW (ref 22–32)
Calcium: 8.5 mg/dL — ABNORMAL LOW (ref 8.9–10.3)
Chloride: 91 mmol/L — ABNORMAL LOW (ref 98–111)
Creatinine, Ser: 1.69 mg/dL — ABNORMAL HIGH (ref 0.61–1.24)
GFR calc Af Amer: 50 mL/min — ABNORMAL LOW (ref >60)
GFR calc non Af Amer: 43 mL/min — ABNORMAL LOW (ref >60)
Glucose, Bld: 171 mg/dL — ABNORMAL HIGH (ref 70–99)
Potassium: 6.1 mmol/L — ABNORMAL HIGH (ref 3.5–5.1)
Sodium: 122 mmol/L — ABNORMAL LOW (ref 135–145)
Total Bilirubin: 1.2 mg/dL (ref 0.3–1.2)
Total Protein: 5.8 g/dL — ABNORMAL LOW (ref 6.5–8.1)

## 2019-01-06 LAB — BASIC METABOLIC PANEL
Anion gap: 10 (ref 5–15)
BUN: 20 mg/dL (ref 8–23)
CO2: 22 mmol/L (ref 22–32)
Calcium: 8.2 mg/dL — ABNORMAL LOW (ref 8.9–10.3)
Chloride: 93 mmol/L — ABNORMAL LOW (ref 98–111)
Creatinine, Ser: 1.47 mg/dL — ABNORMAL HIGH (ref 0.61–1.24)
GFR calc Af Amer: 59 mL/min — ABNORMAL LOW (ref >60)
GFR calc non Af Amer: 51 mL/min — ABNORMAL LOW (ref >60)
Glucose, Bld: 157 mg/dL — ABNORMAL HIGH (ref 70–99)
Potassium: 5.3 mmol/L — ABNORMAL HIGH (ref 3.5–5.1)
Sodium: 125 mmol/L — ABNORMAL LOW (ref 135–145)

## 2019-01-06 LAB — CBC WITH DIFFERENTIAL/PLATELET
Abs Immature Granulocytes: 0 10*3/uL (ref 0.00–0.07)
Basophils Absolute: 0 10*3/uL (ref 0.0–0.1)
Basophils Relative: 0 %
Eosinophils Absolute: 0 10*3/uL (ref 0.0–0.5)
Eosinophils Relative: 0 %
HCT: 30.2 % — ABNORMAL LOW (ref 39.0–52.0)
Hemoglobin: 10.5 g/dL — ABNORMAL LOW (ref 13.0–17.0)
Immature Granulocytes: 0 %
Lymphocytes Relative: 6 %
Lymphs Abs: 0.2 10*3/uL — ABNORMAL LOW (ref 0.7–4.0)
MCH: 34.8 pg — ABNORMAL HIGH (ref 26.0–34.0)
MCHC: 34.8 g/dL (ref 30.0–36.0)
MCV: 100 fL (ref 80.0–100.0)
Monocytes Absolute: 0.2 10*3/uL (ref 0.1–1.0)
Monocytes Relative: 9 %
Neutro Abs: 2.1 10*3/uL (ref 1.7–7.7)
Neutrophils Relative %: 85 %
Platelets: 208 10*3/uL (ref 150–400)
RBC: 3.02 MIL/uL — ABNORMAL LOW (ref 4.22–5.81)
RDW: 11.9 % (ref 11.5–15.5)
Smear Review: NORMAL
WBC: 2.5 10*3/uL — ABNORMAL LOW (ref 4.0–10.5)
nRBC: 0 % (ref 0.0–0.2)

## 2019-01-06 LAB — LACTIC ACID, PLASMA
Lactic Acid, Venous: 2.1 mmol/L (ref 0.5–1.9)
Lactic Acid, Venous: 2.7 mmol/L (ref 0.5–1.9)

## 2019-01-06 LAB — MAGNESIUM
Magnesium: 1.2 mg/dL — ABNORMAL LOW (ref 1.7–2.4)
Magnesium: 1.3 mg/dL — ABNORMAL LOW (ref 1.7–2.4)
Magnesium: 2.2 mg/dL (ref 1.7–2.4)

## 2019-01-06 LAB — PROCALCITONIN: Procalcitonin: 16.06 ng/mL

## 2019-01-06 LAB — HEMOGLOBIN AND HEMATOCRIT, BLOOD
HCT: 32 % — ABNORMAL LOW (ref 39.0–52.0)
Hemoglobin: 10.5 g/dL — ABNORMAL LOW (ref 13.0–17.0)

## 2019-01-06 LAB — TROPONIN I (HIGH SENSITIVITY): Troponin I (High Sensitivity): 5 ng/L (ref <18)

## 2019-01-06 LAB — GLUCOSE, CAPILLARY: Glucose-Capillary: 174 mg/dL — ABNORMAL HIGH (ref 70–99)

## 2019-01-06 LAB — PHOSPHORUS: Phosphorus: 3 mg/dL (ref 2.5–4.6)

## 2019-01-06 LAB — TRIGLYCERIDES: Triglycerides: 42 mg/dL (ref <150)

## 2019-01-06 LAB — MRSA PCR SCREENING: MRSA by PCR: NEGATIVE

## 2019-01-06 MED ORDER — FENTANYL CITRATE (PF) 100 MCG/2ML IJ SOLN: 25.0000 ug | INTRAMUSCULAR | Status: DC | PRN

## 2019-01-06 MED ORDER — PROPOFOL 1000 MG/100ML IV EMUL
INTRAVENOUS | Status: AC
  Filled 2019-01-06: qty 100

## 2019-01-06 MED ORDER — ORAL CARE MOUTH RINSE
15.0000 mL | OROMUCOSAL | Status: DC
  Administered 2019-01-06 (×4): 15 mL via OROMUCOSAL

## 2019-01-06 MED ORDER — MAGNESIUM SULFATE 4 GM/100ML IV SOLN
4.0000 g | Freq: Once | INTRAVENOUS | Status: AC
  Administered 2019-01-06: 4 g via INTRAVENOUS
  Filled 2019-01-06: qty 100

## 2019-01-06 MED ORDER — FLUCONAZOLE IN SODIUM CHLORIDE 400-0.9 MG/200ML-% IV SOLN
800.0000 mg | Freq: Once | INTRAVENOUS | Status: AC
  Administered 2019-01-06: 800 mg via INTRAVENOUS
  Filled 2019-01-06 (×2): qty 400

## 2019-01-06 MED ORDER — DEXMEDETOMIDINE HCL IN NACL 400 MCG/100ML IV SOLN: 0.4000 ug/kg/h | INTRAVENOUS | Status: DC

## 2019-01-06 MED ORDER — CHLORHEXIDINE GLUCONATE 0.12% ORAL RINSE (MEDLINE KIT)
15.0000 mL | Freq: Two times a day (BID) | OROMUCOSAL | Status: DC
  Administered 2019-01-06 (×2): 15 mL via OROMUCOSAL

## 2019-01-06 MED ORDER — PHENYLEPHRINE HCL (PRESSORS) 10 MG/ML IV SOLN
INTRAVENOUS | Status: DC | PRN
  Administered 2019-01-05: 100 ug via INTRAVENOUS

## 2019-01-06 MED ORDER — NOREPINEPHRINE 16 MG/250ML-% IV SOLN
0.0000 ug/min | INTRAVENOUS | Status: DC
  Filled 2019-01-06: qty 250

## 2019-01-06 MED ORDER — LORAZEPAM 2 MG/ML IJ SOLN: 0.5000 mg | Freq: Four times a day (QID) | INTRAMUSCULAR | Status: DC | PRN

## 2019-01-06 MED ORDER — HYDROMORPHONE HCL 1 MG/ML IJ SOLN: 0.5000 mg | INTRAMUSCULAR | Status: DC | PRN

## 2019-01-06 MED ORDER — FLUCONAZOLE IN SODIUM CHLORIDE 400-0.9 MG/200ML-% IV SOLN
400.0000 mg | INTRAVENOUS | Status: DC
  Administered 2019-01-07 – 2019-01-08 (×2): 400 mg via INTRAVENOUS
  Filled 2019-01-06 (×5): qty 200

## 2019-01-06 MED ORDER — SODIUM CHLORIDE 0.9 % IV SOLN
250.0000 mL | INTRAVENOUS | Status: DC
  Administered 2019-01-06 – 2019-01-08 (×2): 250 mL via INTRAVENOUS

## 2019-01-06 MED ORDER — PROPOFOL 1000 MG/100ML IV EMUL
5.0000 ug/kg/min | INTRAVENOUS | Status: DC
  Administered 2019-01-06: 15 ug/kg/min via INTRAVENOUS

## 2019-01-06 MED ORDER — SODIUM CHLORIDE 0.9 % IV BOLUS
1000.0000 mL | Freq: Once | INTRAVENOUS | Status: AC
  Administered 2019-01-06: 1000 mL via INTRAVENOUS

## 2019-01-06 MED ORDER — CHLORHEXIDINE GLUCONATE CLOTH 2 % EX PADS: 6.0000 | MEDICATED_PAD | Freq: Every day | CUTANEOUS | Status: DC

## 2019-01-06 MED ORDER — INSULIN ASPART 100 UNIT/ML IV SOLN
10.0000 [IU] | Freq: Once | INTRAVENOUS | Status: AC
  Administered 2019-01-06: 10 [IU] via INTRAVENOUS
  Filled 2019-01-06: qty 0.1

## 2019-01-06 MED ORDER — LACTATED RINGERS IV BOLUS
1000.0000 mL | Freq: Once | INTRAVENOUS | Status: AC
  Administered 2019-01-06: 02:00:00 1000 mL via INTRAVENOUS

## 2019-01-06 MED ORDER — DEXTROSE 50 % IV SOLN
1.0000 | Freq: Once | INTRAVENOUS | Status: AC
  Administered 2019-01-06: 50 mL via INTRAVENOUS
  Filled 2019-01-06: qty 50

## 2019-01-06 MED ORDER — NOREPINEPHRINE 4 MG/250ML-% IV SOLN
2.0000 ug/min | INTRAVENOUS | Status: DC
  Administered 2019-01-06: 04:00:00 5 ug/min via INTRAVENOUS
  Filled 2019-01-06: qty 250

## 2019-01-06 MED ORDER — LACTATED RINGERS IV BOLUS
1000.0000 mL | Freq: Once | INTRAVENOUS | Status: AC
  Administered 2019-01-06: 1000 mL via INTRAVENOUS

## 2019-01-06 MED ORDER — SODIUM BICARBONATE 8.4 % IV SOLN
50.0000 meq | Freq: Once | INTRAVENOUS | Status: AC
  Administered 2019-01-06: 50 meq via INTRAVENOUS
  Filled 2019-01-06: qty 50

## 2019-01-06 MED ORDER — ONDANSETRON HCL 4 MG/2ML IJ SOLN: 4.0000 mg | Freq: Four times a day (QID) | INTRAMUSCULAR | Status: DC | PRN

## 2019-01-06 MED ORDER — SODIUM CHLORIDE 0.9 % IV SOLN: 4.0000 g | Freq: Once | INTRAVENOUS | Status: DC

## 2019-01-06 NOTE — Op Note (Addendum)
Preoperative diagnosis: Acute abdomen  Postoperative diagnosis: Perforated duodenal ulcer  Procedure: Exploratory laparatomy,abdominal washout, Graham patch repair of perforated duodenal ulcer  Anesthesia: GETA  Surgeon: Benjamine Sprague, DO  Wound Classification: Contaminated  Specimen: Duodenal ulcer  Complications: None apparent  EBL: 150 mL  Indications:  Patient is a 64 y.o. male with acute abdomen.  Please see H&P for further details  Description of procedure:  The patient was placed in the supine position and general endotracheal anesthesia was induced. A time-out was completed verifying correct patient, procedure, site, positioning, and implant(s) and/or special equipment prior to beginning this procedure. Preoperative antibiotics were continued from floor. The abdomen was prepped and draped in the usual sterile fashion. A vertical midline incision was made from xiphoid to halfway towards umbilicus. This was deepened through the subcutaneous tissues and hemostasis was achieved with electrocautery. The linea alba was identified and incised and the peritoneal cavity entered. The abdomen was explored.    Large amounts of purulent fluid noted within the visible abdominal cavity, mostly concentrated around the first portion of the duodenum.  Inspection of this area revealed a 3 mm perforation at the anterior aspect of the first portion of the duodenum.  Small tissue samples was taken around this ulcerated region for pathology analysis.  Hemostasis controlled afterwards.  Attempt was made to primary closed but the 3-0 silk sutures were not able to hold in place secondary to extensive inflammation and friability of the tissue.  3-0 silk stay sutures were then placed surrounding the entire hole.  LigaSure used to create a strip of omentum that easily reached from the lesser omental sac to cover the hole.  The strip was then tied down using the previously placed 3-0 silk sutures over the perforation  to create a [name].  Hemostasis was noted at the end of the procedure.  NG tube confirmed to be in the stomach.   However, due to the extensive inflammatory tissue surrounding the area, Snow hemostatic was placed surrounding the repair for further confirmation of hemostasis.  JP drain was then placed atop the repair and placed through the skin in the right lower quadrant.  After extensive irrigation of the visible abdominal cavity, and noted return of clear fluid, fascia was closed using running 1 PDS x2.  Exparel was infused along the incision prior to closure.  Subcutaneous layer was then approximated using 3-0 Vicryl in interrupted fashion prior to closing the skin with staples.  Midline incision was then dressed with honeycomb dressing.  JP drain secured to skin using 3-0 nylon and dressed with drain sponge.  Patient with labile hypotension throughout the entire procedure, requiring pressor support.  After discussion with anesthesia, decision was made to keep the patient intubated and transferred to ICU for further monitoring and resuscitation.  At the end of the procedure sponge and instrument counts were correct.

## 2019-01-06 NOTE — Anesthesia Procedure Notes (Signed)
Arterial Line Insertion Start/End1/04/2019 11:48 PM, 01/05/2019 11:54 PM Performed by: anesthesiologist  Patient location: OR. Preanesthetic checklist: patient identified Catheter size: 20 G  Attempts: 2 Procedure performed without using ultrasound guided technique. Following insertion, Biopatch. Post procedure assessment: normal  Patient tolerated the procedure well with no immediate complications.

## 2019-01-06 NOTE — Progress Notes (Signed)
Updated pts wife via telephone regarding recent lab values and plan of care all questions were answered.  Marda Stalker, [place] Pager [phone] (please enter 7 digits) PCCM Consult Pager [phone] (please enter 7 digits)

## 2019-01-06 NOTE — Progress Notes (Signed)
Extubation order written. Cuff leak noted.  Patient extubated to 2lpm [place].  Patient tolerated well.

## 2019-01-06 NOTE — Progress Notes (Signed)
Pt successfully extubated to 2L O2 via nasal canula with O2 sats 100%, respiratory rate 27, and he denies shortness of breath.  Prior to extubation I spoke with Dr. Lysle Pearl to determine if pt needed to remain intubated from a surgical standpoint and he stated he did not.  Dr. Galen Daft aware of extubation.  I also contacted pts wife via telephone to inform her the pt has been successfully extubated.  Will continue to monitor and assess pt.  Marda Stalker, [place] Pager [phone] (please enter 7 digits) PCCM Consult Pager [phone] (please enter 7 digits)

## 2019-01-06 NOTE — Progress Notes (Signed)
eLink Physician-Brief Progress Note Patient Name: Louis [name] DOB: Jan 07, 1955 MRN: [id number]   Date of Service  01/06/2019  HPI/Events of Note  62M admitted with bowel perforation who underwent ex-lap overnight for graham patch repair, now admitted to ICU post-op.  The patient is mechanically ventilated on PRVC 500x22, PEEP 5, FiO2 28%. SpO2 is 100%.  BP by A-line is MAP 65-75. Not requiring pressors.   eICU Interventions  Perforated viscus s/p graham patch repair: - Continue Zosyn empiric coverage - F/u BCx - Continue mIVF with LR at 100cc/hr - Fentanyl drip for analgesia (not requiring other sedation)  Respiratory Failure (kept intubated post-op): - Anticipate extubation in AM after SBT.  DVT ppx - SCDs. GI ppx - Protonix Full code.      Intervention Category Evaluation Type: New Patient Evaluation  [name] [name] 01/06/2019, 1:50 AM

## 2019-01-06 NOTE — Anesthesia Postprocedure Evaluation (Signed)
Anesthesia Post Note  Patient: Louis [name]  Procedure(s) Performed: EXPLORATORY LAPAROTOMY graham patch repair (N/A Abdomen)  Patient location during evaluation: SICU Anesthesia Type: General Level of consciousness: awake and patient remains intubated per anesthesia plan Pain management: pain level controlled Vital Signs Assessment: post-procedure vital signs reviewed and stable Respiratory status: patient remains intubated per anesthesia plan, spontaneous breathing and nonlabored ventilation Cardiovascular status: stable Postop Assessment: no apparent nausea or vomiting Anesthetic complications: no Comments: Plan to extubate per intensivist     Last Vitals:  Vitals:   01/06/19 0732 01/06/19 0737  BP:  138/80  Pulse:  85  Resp:  (!) 28  Temp: 36.8 C   SpO2:  100%    Last Pain:  Vitals:   01/06/19 0416  TempSrc: Oral  PainSc:                  [name]

## 2019-01-06 NOTE — Progress Notes (Signed)
Pharmacy Antibiotic Note  Louis [name] is a 64 y.o. male admitted on 01/05/2019 with IAI.  Pharmacy has been consulted for Zosyn dosing.  Plan: Zosyn 3.375g IV q8h (4 hour infusion).  Height: 5\' 11"  (180.3 cm) Weight: 206 lb 12.7 oz (93.8 kg) IBW/kg (Calculated) : 75.3  Temp (24hrs), Avg:99 F (37.2 C), Min:98.9 F (37.2 C), Max:99.1 F (37.3 C)  Recent Labs  Lab 01/05/19 1654 01/06/19 0132  WBC 4.3 PENDING  CREATININE 1.69*  --     Estimated Creatinine Clearance: 53.7 mL/min (A) (by C-G formula based on SCr of 1.69 mg/dL (H)).    Allergies  Allergen Reactions  . Accupril [Quinapril Hcl] Cough   Antimicrobials this admission: Flagyl 1/4 >>  Zosyn 1/4 >>   Dose adjustments this admission:   Microbiology results:  BCx:   UCx:    Sputum:    MRSA PCR:   Thank you for allowing pharmacy to be a part of this patient's care.  Hart Robinsons A 01/06/2019 2:30 AM

## 2019-01-06 NOTE — Consult Note (Signed)
Pharmacy Antibiotic Note  Louis [name] is a 64 y.o. male admitted on 01/05/2019 with peritonitis and subsequent exploratory laparatomy.  Pharmacy has been consulted for diflucan dosing following duodenal perforation to provide fungal coverage.  WBC is trending low (2.5) with elevated PCT 16.06.  Patient is also being covered with Zosyn for suspected intra-abdominal infection.  Patient is currently on day 2 antibiotics total.  Plan: 1.  Diflucan  Will give IV diflucan 800 mg x 1 loading dose.  Will give maintenance dose of IV diflucan 400 mg once daily.  2.  Zosyn  Will continue Zosyn 3.375 g IV q8 hours  Serum creatinine elevated, baseline appears ~1.0.  Pharmacy will monitor renal function.    Height: 5\' 11"  (180.3 cm) Weight: 206 lb 12.7 oz (93.8 kg) IBW/kg (Calculated) : 75.3  Temp (24hrs), Avg:98.7 F (37.1 C), Min:98.3 F (36.8 C), Max:99.1 F (37.3 C)  Recent Labs  Lab 01/05/19 1654 01/06/19 0132 01/06/19 0634  WBC 4.3 2.5*  --   CREATININE 1.69* 1.69* 1.47*  LATICACIDVEN  --  2.1* 2.7*    Estimated Creatinine Clearance: 61.7 mL/min (A) (by C-G formula based on SCr of 1.47 mg/dL (H)).    Allergies  Allergen Reactions  . Accupril [Quinapril Hcl] Cough    Antimicrobials this admission: 1/4 Zosyn  >> 1/5 Diflucan >>  Dose adjustments this admission: None  Microbiology results: 1/4 BCx: NG x 12 hours 1/4 MRSA PCR: negative 1/4 Resp panel (Flu A&B, Covid):  negative  Thank you for allowing pharmacy to be a part of this patient's care.  Gerald Dexter, PharmD Pharmacy Resident  01/06/2019 1:02 PM

## 2019-01-06 NOTE — Progress Notes (Addendum)
Subjective:  CC: Louis [name] is a 64 y.o. male  Hospital stay day 1, 1 Day Post-Op exploratory laparotomy, Graham patch repair for perforated duodenal ulcer  HPI: Patient was extubated this a.m.  Currently feeling better with still some discomfort.  Foley catheter removed and was able to void afterwards.  ROS:  General: Denies weight loss, weight gain, fatigue, fevers, chills, and night sweats. Heart: Denies chest pain, palpitations, racing heart, irregular heartbeat, leg pain or swelling, and decreased activity tolerance. Respiratory: Denies breathing difficulty, shortness of breath, wheezing, cough, and sputum. GI: Denies change in appetite, heartburn, nausea, vomiting, constipation, diarrhea, and blood in stool. GU: Denies difficulty urinating, pain with urinating, urgency, frequency, blood in urine.   Objective:   Temp:  [98.3 F (36.8 C)-99.1 F (37.3 C)] 98.3 F (36.8 C) (01/05 0732) Pulse Rate:  [59-89] 75 (01/05 1130) Resp:  [16-31] 21 (01/05 1130) BP: (64-158)/(37-109) 87/48 (01/05 1130) SpO2:  [93 %-100 %] 99 % (01/05 1130) Arterial Line BP: (61-107)/(41-61) 97/51 (01/05 0600) FiO2 (%):  [28 %-40 %] 28 % (01/05 0720) Weight:  [93.8 kg-96.2 kg] 93.8 kg (01/04 2347)     Height: 5\' 11"  (180.3 cm) Weight: 93.8 kg BMI (Calculated): 28.85   Intake/Output this shift:   Intake/Output Summary (Last 24 hours) at 01/06/2019 1304 Last data filed at 01/06/2019 1000 Gross per 24 hour  Intake 3012.78 ml  Output 1085 ml  Net 1927.78 ml    Constitutional :  alert, cooperative, appears stated age and no distress  Respiratory:  clear to auscultation bilaterally  Cardiovascular:  regular rate and rhythm  Gastrointestinal: Soft, no guarding, focal tenderness to palpation in bilateral lower quadrants.  Incision clean dry and intact with staples.  JP drain with serosanguineous output.   Skin: Cool and moist.   Psychiatric: Normal affect, non-agitated, not confused       LABS:  CMP  Latest Ref Rng & Units 01/06/2019 01/06/2019 01/05/2019  Glucose 70 - 99 mg/dL 157(H) 171(H) 140(H)  BUN 8 - 23 mg/dL 20 21 21   Creatinine 0.61 - 1.24 mg/dL 1.47(H) 1.69(H) 1.69(H)  Sodium 135 - 145 mmol/L 125(L) 122(L) 119(LL)  Potassium 3.5 - 5.1 mmol/L 5.3(H) 6.1(H) 6.0(H)  Chloride 98 - 111 mmol/L 93(L) 91(L) 85(L)  CO2 22 - 32 mmol/L 22 20(L) 19(L)  Calcium 8.9 - 10.3 mg/dL 8.2(L) 8.5(L) 9.7  Total Protein 6.5 - 8.1 g/dL - 5.8(L) 7.4  Total Bilirubin 0.3 - 1.2 mg/dL - 1.2 1.2  Alkaline Phos 38 - 126 U/L - 33(L) 58  AST 15 - 41 U/L - 93(H) 100(H)  ALT 0 - 44 U/L - 101(H) 126(H)   CBC Latest Ref Rng & Units 01/06/2019 01/05/2019 07/04/2017  WBC 4.0 - 10.5 K/uL 2.5(L) 4.3 7.2  Hemoglobin 13.0 - 17.0 g/dL 10.5(L) 11.7(L) 16.5  Hematocrit 39.0 - 52.0 % 30.2(L) 33.2(L) 52.6(H)  Platelets 150 - 400 K/uL 208 250 237    RADS: n/a Assessment:   S/p exploratory laparotomy, Graham patch repair for perforated duodenal ulcer.  Clinically doing well.  However, recent hypotension noted with serial measurements.  Continue to monitor closely in the ICU, volume resuscitation and pressor support as needed.  We will continue n.p.o. with NG tube decompression to allow time for the [facility] patch to heal.  If all goes well plan is for upper GI study on postop day 3 prior to potentially resuming feeds.  IV fluid, antibiotics in the meantime.  Case discussed with patient as well as wife  and they are agreeable to plan.  ADDENDUM: Pt with persistent hypotension, now back on levophed s/p bolus.  Will recheck hgb to ensure no bleeding.  UPDATE; Repeat Hgb stable, hypotension likely from sepsis.  Will continue supportive care

## 2019-01-07 LAB — MAGNESIUM: Magnesium: 1.9 mg/dL (ref 1.7–2.4)

## 2019-01-07 LAB — BASIC METABOLIC PANEL
Anion gap: 8 (ref 5–15)
BUN: 18 mg/dL (ref 8–23)
CO2: 24 mmol/L (ref 22–32)
Calcium: 8.3 mg/dL — ABNORMAL LOW (ref 8.9–10.3)
Chloride: 97 mmol/L — ABNORMAL LOW (ref 98–111)
Creatinine, Ser: 1.12 mg/dL (ref 0.61–1.24)
GFR calc Af Amer: >60 mL/min (ref >60)
GFR calc non Af Amer: >60 mL/min (ref >60)
Glucose, Bld: 111 mg/dL — ABNORMAL HIGH (ref 70–99)
Potassium: 4.5 mmol/L (ref 3.5–5.1)
Sodium: 129 mmol/L — ABNORMAL LOW (ref 135–145)

## 2019-01-07 LAB — SURGICAL PATHOLOGY

## 2019-01-07 LAB — CBC
HCT: 29.3 % — ABNORMAL LOW (ref 39.0–52.0)
Hemoglobin: 10 g/dL — ABNORMAL LOW (ref 13.0–17.0)
MCH: 35.2 pg — ABNORMAL HIGH (ref 26.0–34.0)
MCHC: 34.1 g/dL (ref 30.0–36.0)
MCV: 103.2 fL — ABNORMAL HIGH (ref 80.0–100.0)
Platelets: 193 10*3/uL (ref 150–400)
RBC: 2.84 MIL/uL — ABNORMAL LOW (ref 4.22–5.81)
RDW: 12 % (ref 11.5–15.5)
WBC: 4.7 10*3/uL (ref 4.0–10.5)
nRBC: 0 % (ref 0.0–0.2)

## 2019-01-07 LAB — PHOSPHORUS: Phosphorus: 2.9 mg/dL (ref 2.5–4.6)

## 2019-01-07 LAB — PROCALCITONIN: Procalcitonin: 8.88 ng/mL

## 2019-01-07 MED ORDER — KCL IN DEXTROSE-NACL 40-5-0.45 MEQ/L-%-% IV SOLN
INTRAVENOUS | Status: DC
  Filled 2019-01-07 (×5): qty 1000

## 2019-01-07 MED ORDER — PHENOL 1.4 % MT LIQD
1.0000 | OROMUCOSAL | Status: DC | PRN
  Filled 2019-01-07: qty 177

## 2019-01-07 MED ORDER — ENOXAPARIN SODIUM 40 MG/0.4ML ~~LOC~~ SOLN
40.0000 mg | SUBCUTANEOUS | Status: DC
  Administered 2019-01-07 – 2019-01-08 (×2): 40 mg via SUBCUTANEOUS
  Filled 2019-01-07 (×2): qty 0.4

## 2019-01-07 MED ORDER — ORAL CARE MOUTH RINSE
15.0000 mL | Freq: Two times a day (BID) | OROMUCOSAL | Status: DC
  Administered 2019-01-07 – 2019-01-08 (×3): 15 mL via OROMUCOSAL

## 2019-01-07 NOTE — Evaluation (Signed)
Physical Therapy Evaluation Patient Details Name: Louis [name] MRN: [id number] DOB: 1955-03-09 Today's Date: 01/07/2019   History of Present Illness  Pt admitted for complaints of severe R abdominal pain with + bowel perforation and is now POD 2 ex-lap with patch repair for perforated ulcer. Stay complicated by CCU admission and intubation from 1/4-1/5.   Clinical Impression  Pt is a pleasant 64 year old male who was admitted for bowel perforation s/p ex-lap with patch repair. Pt performs bed mobility with supervision, transfers with min assist, and ambulation with min assist and no AD. Pt with unsteadiness present, may benefit from trial of AD to improve balance and decrease falls risk. Pt demonstrates deficits with strength/endurance/power. Would benefit from skilled PT to address above deficits and promote optimal return to PLOF. Recommend transition to [place] upon discharge from acute hospitalization.     Follow Up Recommendations Home health PT;Supervision for mobility/OOB    Equipment Recommendations  None recommended by PT    Recommendations for Other Services       Precautions / Restrictions Precautions Precautions: Fall Restrictions Weight Bearing Restrictions: No      Mobility  Bed Mobility Overal bed mobility: Needs Assistance Bed Mobility: Supine to Sit     Supine to sit: Supervision     General bed mobility comments: takes increased time to perform. Has difficulty scooting out towards EOB.   Transfers Overall transfer level: Needs assistance Equipment used: None Transfers: Sit to/from Stand Sit to Stand: Min assist         General transfer comment: slightly impulsive upon lift off with standing prior to cues. Once standing, post leaning and slight tremors. Braces back of legs against bed  Ambulation/Gait Ambulation/Gait assistance: Min assist Gait Distance (Feet): 3 Feet Assistive device: None Gait Pattern/deviations: Step-to pattern     General Gait  Details: hesitant steps with hands reaching out for stability. Cues for safety. Fatigues quickly. HR increases to 132bpm with exertion. Further ambulation deferred  Stairs            Wheelchair Mobility    Modified Rankin (Stroke Patients Only)       Balance Overall balance assessment: Needs assistance Sitting-balance support: Feet supported Sitting balance-Leahy Scale: Good Sitting balance - Comments: upright posture   Standing balance support: No upper extremity supported Standing balance-Leahy Scale: Fair Standing balance comment: post leaning                             Pertinent Vitals/Pain Pain Assessment: Faces Faces Pain Scale: Hurts a little bit Pain Location: abdomen pain with exertion Pain Descriptors / Indicators: Operative site guarding Pain Intervention(s): Limited activity within patient's tolerance;Repositioned    Home Living Family/patient expects to be discharged to:: Private residence Living Arrangements: Spouse/significant other Available Help at Discharge: Family;Available 24 hours/day Type of Home: House Home Access: Stairs to enter Entrance Stairs-Rails: Can reach both Entrance Stairs-Number of Steps: 4 Home Layout: Able to live on main level with bedroom/bathroom Home Equipment: Walker - 2 wheels;Cane - single point;Crutches      Prior Function Level of Independence: Independent         Comments: previous active, independent with household ambulation. 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 Reports recently has had issues with endurance. No falls     Hand Dominance        Extremity/Trunk Assessment   Upper Extremity Assessment Upper Extremity Assessment:  Overall [facility] for tasks assessed    Lower Extremity Assessment Lower Extremity Assessment: Generalized weakness(B LE grossly 4/5)       Communication   Communication: No difficulties  Cognition Arousal/Alertness: Awake/alert Behavior  During Therapy: WFL for tasks assessed/performed Overall Cognitive Status: Within Functional Limits for tasks assessed                                        General Comments      Exercises Other Exercises Other Exercises: supine ther-ex performed on B LE including quad sets, SLRs, and heel slides. All ther-ex performed x 10 reps with supervision with SOB symptoms.   Assessment/Plan    PT Assessment Patient needs continued PT services  PT Problem List Decreased strength;Decreased activity tolerance;Decreased balance;Decreased mobility;Decreased knowledge of use of DME;Decreased safety awareness;Cardiopulmonary status limiting activity       PT Treatment Interventions Gait training;DME instruction;Therapeutic exercise;Balance training;Therapeutic activities;Stair training    PT Goals (Current goals can be found in the Care Plan section)  Acute Rehab PT Goals Patient Stated Goal: to go home PT Goal Formulation: With patient Time For Goal Achievement: 01/21/19 Potential to Achieve Goals: Good    Frequency Min 2X/week   Barriers to discharge        Co-evaluation               AM-PAC PT "6 Clicks" Mobility  Outcome Measure Help needed turning from your back to your side while in a flat bed without using bedrails?: A Little Help needed moving from lying on your back to sitting on the side of a flat bed without using bedrails?: A Little Help needed moving to and from a bed to a chair (including a wheelchair)?: A Little Help needed standing up from a chair using your arms (e.g., wheelchair or bedside chair)?: A Little Help needed to walk in hospital room?: A Lot Help needed climbing 3-5 steps with a railing? : A Lot 6 Click Score: 16    End of Session   Activity Tolerance: Patient tolerated treatment well Patient left: in chair;with SCD's reapplied Nurse Communication: Mobility status PT Visit Diagnosis: Unsteadiness on feet (R26.81);Muscle weakness  (generalized) (M62.81);Difficulty in walking, not elsewhere classified (R26.2)    Time: [id number] PT Time Calculation (min) (ACUTE ONLY): 41 min   Charges:   PT Evaluation $PT Eval Moderate Complexity: 1 Mod PT Treatments $Therapeutic Exercise: 23-37 mins        Greggory Stallion, PT, DPT [phone]   Yang Rack 01/07/2019, 4:20 PM

## 2019-01-07 NOTE — Progress Notes (Signed)
Subjective:  CC: Louis [name] is a 64 y.o. male  Hospital stay day 2, 2 Days Post-Op exploratory laparotomy, Graham patch repair for perforated duodenal ulcer  HPI: No acute issues overnight.  Off pressors at this time.  ROS:  General: Denies weight loss, weight gain, fatigue, fevers, chills, and night sweats. Heart: Denies chest pain, palpitations, racing heart, irregular heartbeat, leg pain or swelling, and decreased activity tolerance. Respiratory: Denies breathing difficulty, shortness of breath, wheezing, cough, and sputum. GI: Denies change in appetite, heartburn, nausea, vomiting, constipation, diarrhea, and blood in stool. GU: Denies difficulty urinating, pain with urinating, urgency, frequency, blood in urine.   Objective:   Temp:  [98 F (36.7 C)-98.9 F (37.2 C)] 98.4 F (36.9 C) (01/06 0800) Pulse Rate:  [74-114] 89 (01/06 0918) Resp:  [16-30] 28 (01/06 0918) BP: (83-141)/(52-92) 126/79 (01/06 0918) SpO2:  [93 %-100 %] 94 % (01/06 0918)     Height: 5\' 11"  (180.3 cm) Weight: 93.8 kg BMI (Calculated): 28.85   Intake/Output this shift:   Intake/Output Summary (Last 24 hours) at 01/07/2019 1146 Last data filed at 01/07/2019 0900 Gross per 24 hour  Intake 1623.37 ml  Output 1145 ml  Net 478.37 ml    Constitutional :  alert, cooperative, appears stated age and no distress  Respiratory:  clear to auscultation bilaterally  Cardiovascular:  regular rate and rhythm  Gastrointestinal: Soft, no guarding, decreasing focal tenderness to palpation in bilateral lower quadrants.  Incision clean dry and intact with staples.  JP drain with serosanguineous output.  NG with gastric output.   Skin: Cool and moist.   Psychiatric: Normal affect, non-agitated, not confused       LABS:  CMP Latest Ref Rng & Units 01/07/2019 01/06/2019 01/06/2019  Glucose 70 - 99 mg/dL 111(H) 157(H) 171(H)  BUN 8 - 23 mg/dL 18 20 21   Creatinine 0.61 - 1.24 mg/dL 1.12 1.47(H) 1.69(H)  Sodium 135 - 145 mmol/L  129(L) 125(L) 122(L)  Potassium 3.5 - 5.1 mmol/L 4.5 5.3(H) 6.1(H)  Chloride 98 - 111 mmol/L 97(L) 93(L) 91(L)  CO2 22 - 32 mmol/L 24 22 20(L)  Calcium 8.9 - 10.3 mg/dL 8.3(L) 8.2(L) 8.5(L)  Total Protein 6.5 - 8.1 g/dL - - 5.8(L)  Total Bilirubin 0.3 - 1.2 mg/dL - - 1.2  Alkaline Phos 38 - 126 U/L - - 33(L)  AST 15 - 41 U/L - - 93(H)  ALT 0 - 44 U/L - - 101(H)   CBC Latest Ref Rng & Units 01/07/2019 01/06/2019 01/06/2019  WBC 4.0 - 10.5 K/uL 4.7 - 2.5(L)  Hemoglobin 13.0 - 17.0 g/dL 10.0(L) 10.5(L) 10.5(L)  Hematocrit 39.0 - 52.0 % 29.3(L) 32.0(L) 30.2(L)  Platelets 150 - 400 K/uL 193 - 208    RADS: n/a Assessment:   S/p exploratory laparotomy, Graham patch repair for perforated duodenal ulcer.  Clinically doing well.  Labs improving, no episodes of further hypotension, adequate urine output.  We will continue n.p.o. with NG tube decompression to allow time for the [facility] patch to heal.  If all goes well plan is for upper GI study on postop day 3 prior to potentially resume feeds.  Continue IV fluid, antibiotics in the meantime.  Case discussed with patient and he verbalized understanding.

## 2019-01-08 ENCOUNTER — Inpatient Hospital Stay: Payer: 59

## 2019-01-08 LAB — CBC
HCT: 28.5 % — ABNORMAL LOW (ref 39.0–52.0)
Hemoglobin: 9.4 g/dL — ABNORMAL LOW (ref 13.0–17.0)
MCH: 34.3 pg — ABNORMAL HIGH (ref 26.0–34.0)
MCHC: 33 g/dL (ref 30.0–36.0)
MCV: 104 fL — ABNORMAL HIGH (ref 80.0–100.0)
Platelets: 195 10*3/uL (ref 150–400)
RBC: 2.74 MIL/uL — ABNORMAL LOW (ref 4.22–5.81)
RDW: 12.1 % (ref 11.5–15.5)
WBC: 5.5 10*3/uL (ref 4.0–10.5)
nRBC: 0 % (ref 0.0–0.2)

## 2019-01-08 LAB — BASIC METABOLIC PANEL
Anion gap: 9 (ref 5–15)
BUN: 19 mg/dL (ref 8–23)
CO2: 24 mmol/L (ref 22–32)
Calcium: 8.5 mg/dL — ABNORMAL LOW (ref 8.9–10.3)
Chloride: 99 mmol/L (ref 98–111)
Creatinine, Ser: 1.11 mg/dL (ref 0.61–1.24)
GFR calc Af Amer: >60 mL/min (ref >60)
GFR calc non Af Amer: >60 mL/min (ref >60)
Glucose, Bld: 115 mg/dL — ABNORMAL HIGH (ref 70–99)
Potassium: 4.5 mmol/L (ref 3.5–5.1)
Sodium: 132 mmol/L — ABNORMAL LOW (ref 135–145)

## 2019-01-08 LAB — PROCALCITONIN: Procalcitonin: 5.81 ng/mL

## 2019-01-08 MED ORDER — IOHEXOL 300 MG/ML  SOLN
200.0000 mL | Freq: Once | INTRAMUSCULAR | Status: AC | PRN
  Administered 2019-01-08: 12:00:00 200 mL

## 2019-01-08 NOTE — Progress Notes (Signed)
OT Cancellation Note  Patient Details Name: Louis [name] MRN: [id number] DOB: August 07, 1955   Cancelled Treatment:    Reason Eval/Treat Not Completed: Fatigue/lethargy limiting ability to participate;Patient declined, no reason specified. On 4th attempt to evaluated for OT, pt politely but adamantly declining therapy this afternoon stating, "I've just had too much today." Educated pt/wife in role of OT. Pt/wife agreeable to OT re-attempting next date (in the morning).   Jeni Salles, MPH, MS, OTR/L ascom [phone] 01/08/19, 2:11 PM

## 2019-01-08 NOTE — Progress Notes (Addendum)
OT Cancellation Note  Patient Details Name: Louis [name] MRN: [id number] DOB: 1955/10/22   Cancelled Treatment:    Reason Eval/Treat Not Completed: Other (comment). Consult received, chart reviewed. Pt sitting EOB upon attempt. Pt reports the physician was examining his incision site and got called out of the room for a code. Pt prefers to remain sitting and wait for the physician to return prior to therapy. Will re-attempt OT evaluation at later time this morning.   Addendum, 10:14am: Pt on 2nd attempt was working with PT. Upon 3rd attempt, pt reports going for a test soon. Pt agreeable to OT re-attempting OT Evaluation this afternoon as pt is available and medically appropriate.   Jeni Salles, MPH, MS, OTR/L ascom [phone] 01/08/19, 9:02 AM

## 2019-01-08 NOTE — Progress Notes (Signed)
Subjective:  CC: Louis [name] is a 64 y.o. male  Hospital stay day 3, 3 Days Post-Op exploratory laparotomy, Graham patch repair for perforated duodenal ulcer  HPI: NG accidentally removed overnight.  Replaced.  No issues otherwise.  ROS:  General: Denies weight loss, weight gain, fatigue, fevers, chills, and night sweats. Heart: Denies chest pain, palpitations, racing heart, irregular heartbeat, leg pain or swelling, and decreased activity tolerance. Respiratory: Denies breathing difficulty, shortness of breath, wheezing, cough, and sputum. GI: Denies change in appetite, heartburn, nausea, vomiting, constipation, diarrhea, and blood in stool. GU: Denies difficulty urinating, pain with urinating, urgency, frequency, blood in urine.   Objective:   Temp:  [98.2 F (36.8 C)-98.5 F (36.9 C)] 98.3 F (36.8 C) (01/07 0801) Pulse Rate:  [79-87] 79 (01/07 0801) Resp:  [18-29] 20 (01/07 0801) BP: (127-144)/(82-90) 130/90 (01/07 0801) SpO2:  [96 %-99 %] 96 % (01/07 0801)     Height: 5\' 11"  (180.3 cm) Weight: 93.8 kg BMI (Calculated): 28.85   Intake/Output this shift:   Intake/Output Summary (Last 24 hours) at 01/08/2019 1313 Last data filed at 01/08/2019 1139 Gross per 24 hour  Intake 2832.06 ml  Output 660 ml  Net 2172.06 ml    Constitutional :  alert, cooperative, appears stated age and no distress  Respiratory:  clear to auscultation bilaterally  Cardiovascular:  regular rate and rhythm  Gastrointestinal: Soft, no guarding, decreasing focal tenderness to palpation in bilateral lower quadrants.  Incision clean dry and intact with staples.  JP drain with serosanguineous output.  NG with gastric output.   Skin: Cool and moist.   Psychiatric: Normal affect, non-agitated, not confused       LABS:  CMP Latest Ref Rng & Units 01/08/2019 01/07/2019 01/06/2019  Glucose 70 - 99 mg/dL 115(H) 111(H) 157(H)  BUN 8 - 23 mg/dL 19 18 20   Creatinine 0.61 - 1.24 mg/dL 1.11 1.12 1.47(H)  Sodium 135 -  145 mmol/L 132(L) 129(L) 125(L)  Potassium 3.5 - 5.1 mmol/L 4.5 4.5 5.3(H)  Chloride 98 - 111 mmol/L 99 97(L) 93(L)  CO2 22 - 32 mmol/L 24 24 22   Calcium 8.9 - 10.3 mg/dL 8.5(L) 8.3(L) 8.2(L)  Total Protein 6.5 - 8.1 g/dL - - -  Total Bilirubin 0.3 - 1.2 mg/dL - - -  Alkaline Phos 38 - 126 U/L - - -  AST 15 - 41 U/L - - -  ALT 0 - 44 U/L - - -   CBC Latest Ref Rng & Units 01/08/2019 01/07/2019 01/06/2019  WBC 4.0 - 10.5 K/uL 5.5 4.7 -  Hemoglobin 13.0 - 17.0 g/dL 9.4(L) 10.0(L) 10.5(L)  Hematocrit 39.0 - 52.0 % 28.5(L) 29.3(L) 32.0(L)  Platelets 150 - 400 K/uL 195 193 -    RADS: CLINICAL DATA:  Prior GI surgery.  Evaluation for leak.  EXAM: WATER SOLUBLE UPPER GI SERIES  TECHNIQUE: Single-column upper GI series was performed using water soluble contrast.  CONTRAST:  Omnipaque 300.  COMPARISON:  Abdomen and CT 01/05/2019.  Upper GI 10/10/2005.  FLUOROSCOPY TIME:  Fluoroscopy Time:  1 minutes 48 seconds  Radiation Exposure Index (if provided by the fluoroscopic device): 61.5 mGy  FINDINGS: Scribe film reveals NG tube in the left upper abdomen. Surgical tubing noted over the mid abdomen. Dilated loops of small large bowel noted suggesting adynamic ileus. Surgical staples noted over the abdomen. Diluted Omnipaque 300 was administered into the NG tube and upper GI obtained. Stomach and duodenum appear to be intact. Normal imaging. No evidence of  contrast extravasation.  IMPRESSION: 1. No evidence of contrast extravasation after GI surgery. Stomach and duodenum are widely patent.  2.  Adynamic ileus.   Electronically Signed   By: Marcello Moores  Register   On: 01/08/2019 11:44 Assessment:   S/p exploratory laparotomy, [name] repair for perforated duodenal ulcer.    Upper GI negative for leak, but concerns for ileus. Will perform clamp trial prior to NG tube removal.  Staple dressing removed today.  Continue JP for now.  Cont IV abx.  Drop hgb noted.   Hopefully hemodilution from fluid shifts.  Will continue to monitor.  PT/OT recs reviewed, home health order in.

## 2019-01-08 NOTE — Progress Notes (Signed)
RT called to patient bedside to assist with CPAP per Patient request. Patient stated he just wanted the O2 set up on his CPAP but he is not ready to wear it right now. Will have RN call RT when he is ready. CPAP at bedside on standby.

## 2019-01-08 NOTE — Progress Notes (Signed)
Physical Therapy Treatment Patient Details Name: Louis [name] MRN: [id number] DOB: Aug 25, 1955 Today's Date: 01/08/2019    History of Present Illness Pt admitted for complaints of severe R abdominal pain with + bowel perforation and is now post exploratory laproscopy with patch repair for perforated ulcer. Stay complicated by CCU admission and intubation from 1/4-1/5.     PT Comments    Pt did well with ambulation with the walker and w/o, though he quickly had increased HR to nearly 140 bpm.  Pt does not report excessive fatigue, but did feel worn out and did not feel as though he could try another bout of ambulation even after rest break and HR normalizing.  O2 remained in the mid/high 90s t/o the effort and overall he was safe for in-home mobility/safety but medically continued to have some issues.  Pt with good attitude and effort t/o the session.   Follow Up Recommendations  Home health PT;Supervision for mobility/OOB     Equipment Recommendations  None recommended by PT    Recommendations for Other Services       Precautions / Restrictions Precautions Precautions: Fall Restrictions Weight Bearing Restrictions: No    Mobility  Bed Mobility               General bed mobility comments: in recliner on arrival, returned to recliner  Transfers Overall transfer level: Needs assistance   Transfers: Sit to/from Stand Sit to Stand: Supervision         General transfer comment: Pt able to rise with AD and later w/o AD and did not need direct assist  Ambulation/Gait Ambulation/Gait assistance: Supervision Gait Distance (Feet): 60 Feet Assistive device: None;Rolling walker (2 wheeled)       General Gait Details: ~20 ft with FWW and then remainder w/o AD.  He did have HR quickly rise to nearly 140 and further ambulation deferred.  Pt generally feeling safe during ambulation but quick to fatigue and even with HR dropping after brief rest break did not feel that he wanted  to do further ambulation.    Stairs             Wheelchair Mobility    Modified Rankin (Stroke Patients Only)       Balance Overall balance assessment: Modified Independent   Sitting balance-Leahy Scale: Good       Standing balance-Leahy Scale: Fair                              Cognition Arousal/Alertness: Awake/alert Behavior During Therapy: WFL for tasks assessed/performed Overall Cognitive Status: Within Functional Limits for tasks assessed                                        Exercises General Exercises - Lower Extremity Ankle Circles/Pumps: AROM;10 reps Long Arc Quad: Strengthening;10 reps Heel Slides: 10 reps;Strengthening Hip ABduction/ADduction: Strengthening;10 reps Hip Flexion/Marching: Strengthening;10 reps    General Comments        Pertinent Vitals/Pain Pain Assessment: 0-10 Pain Score: 3  Pain Location: abdomen pain with exertion    Home Living                      Prior Function            PT Goals (current goals can now be found in the care plan  section) Progress towards PT goals: Progressing toward goals    Frequency    Min 2X/week      PT Plan Current plan remains appropriate    Co-evaluation              AM-PAC PT "6 Clicks" Mobility   Outcome Measure  Help needed turning from your back to your side while in a flat bed without using bedrails?: A Little Help needed moving from lying on your back to sitting on the side of a flat bed without using bedrails?: A Little Help needed moving to and from a bed to a chair (including a wheelchair)?: A Little Help needed standing up from a chair using your arms (e.g., wheelchair or bedside chair)?: None Help needed to walk in hospital room?: A Little Help needed climbing 3-5 steps with a railing? : A Little 6 Click Score: 19    End of Session Equipment Utilized During Treatment: Gait belt Activity Tolerance: Patient tolerated  treatment well;Patient limited by fatigue Patient left: in chair Nurse Communication: Mobility status PT Visit Diagnosis: Unsteadiness on feet (R26.81);Muscle weakness (generalized) (M62.81);Difficulty in walking, not elsewhere classified (R26.2)     Time: [id number] PT Time Calculation (min) (ACUTE ONLY): 28 min  Charges:  $Gait Training: 8-22 mins $Therapeutic Exercise: 8-22 mins                     Kreg Shropshire, DPT 01/08/2019, 1:48 PM

## 2019-01-09 ENCOUNTER — Telehealth: Payer: PRIVATE HEALTH INSURANCE | Admitting: Family Medicine

## 2019-01-09 LAB — BASIC METABOLIC PANEL
Anion gap: 7 (ref 5–15)
BUN: 16 mg/dL (ref 8–23)
CO2: 25 mmol/L (ref 22–32)
Calcium: 8.4 mg/dL — ABNORMAL LOW (ref 8.9–10.3)
Chloride: 101 mmol/L (ref 98–111)
Creatinine, Ser: 1.08 mg/dL (ref 0.61–1.24)
GFR calc Af Amer: >60 mL/min (ref >60)
GFR calc non Af Amer: >60 mL/min (ref >60)
Glucose, Bld: 112 mg/dL — ABNORMAL HIGH (ref 70–99)
Potassium: 4.5 mmol/L (ref 3.5–5.1)
Sodium: 133 mmol/L — ABNORMAL LOW (ref 135–145)

## 2019-01-09 LAB — HEMOGLOBIN AND HEMATOCRIT, BLOOD
HCT: 27.3 % — ABNORMAL LOW (ref 39.0–52.0)
Hemoglobin: 8.9 g/dL — ABNORMAL LOW (ref 13.0–17.0)

## 2019-01-09 MED ORDER — ACETAMINOPHEN 325 MG PO TABS: 650.0000 mg | ORAL_TABLET | Freq: Three times a day (TID) | ORAL | 0 refills | Status: AC | PRN

## 2019-01-09 MED ORDER — DOCUSATE SODIUM 100 MG PO CAPS: 100.0000 mg | ORAL_CAPSULE | Freq: Two times a day (BID) | ORAL | 0 refills | Status: AC | PRN

## 2019-01-09 MED ORDER — PANTOPRAZOLE SODIUM 40 MG PO TBEC: 40.0000 mg | DELAYED_RELEASE_TABLET | Freq: Every day | ORAL | 1 refills | Status: DC

## 2019-01-09 MED ORDER — IBUPROFEN 800 MG PO TABS: 800.0000 mg | ORAL_TABLET | Freq: Three times a day (TID) | ORAL | 0 refills | Status: DC | PRN

## 2019-01-09 MED ORDER — HYDROCODONE-ACETAMINOPHEN 5-325 MG PO TABS: 1.0000 | ORAL_TABLET | Freq: Four times a day (QID) | ORAL | 0 refills | Status: DC | PRN

## 2019-01-09 NOTE — TOC Initial Note (Addendum)
Transition of Care [facility]) - Initial/Assessment Note    Patient Details  Name: Louis [name] MRN: [id number] Date of Birth: 1955/10/14  Transition of Care [facility]) CM/SW Contact:    Beverly Sessions, RN Phone Number: 01/09/2019, 1:34 PM  Clinical Narrative:                 Patient admitted from home with perforated viscus  Patient states he lives at home with wife  PCP Pamella Pert  Denies issues with transportation or obtaining his medication  Patient states that he has a RW, cane, and crutches in the home  PT has assessed patient and recommends home health PT.  Patient agreeable.  Only preferred home health agency in network with Christella Scheuermann is [place].  Patient in agreement for referral to be made to [facility].  Referral made to [facility] with [place] unable to accept them referral will have to go through [facility]   1348  Received notification that patient's insurance plan does not include home health coverage.  Patient notified.  Offered option to private pay. Patient does not feel like it is indicated at this time.  Discussed option of outpatient patient being order by his PCP should he get home and feel like it is needed    Expected Discharge Plan: [place] Services Barriers to Discharge: No Barriers Identified   Patient Goals and CMS Choice   CMS Medicare.gov Compare Post Acute Care list provided to:: Patient Choice offered to / list presented to : Patient  Expected Discharge Plan and Services Expected Discharge Plan: [place]       Living arrangements for the past 2 months: Single Family Home Expected Discharge Date: 01/09/19                         HH Arranged: PT [place] Agency: [place] ([place]) Date [place]: 01/09/19 Time [place]: [place] Representative spoke with at [place]: Corene Cornea  Prior Living Arrangements/Services Living arrangements for the  past 2 months: [employer] with:: Spouse Patient language and need for interpreter reviewed:: Yes Do you feel safe going back to the place where you live?: Yes      Need for Family Participation in Patient Care: No (Comment) Care giver support system in place?: Yes (comment)   Criminal Activity/Legal Involvement Pertinent to Current Situation/Hospitalization: No - Comment as needed  Activities of Daily Living Home Assistive Devices/Equipment: None ADL Screening (condition at time of admission) [name] cognitive ability adequate to safely complete daily activities?: Yes Is the patient deaf or have difficulty hearing?: No Does the patient have difficulty seeing, even when wearing glasses/contacts?: No Does the patient have difficulty concentrating, remembering, or making decisions?: No Patient able to express need for assistance with ADLs?: Yes Does the patient have difficulty dressing or bathing?: No Independently performs ADLs?: Yes (appropriate for developmental age) Does the patient have difficulty walking or climbing stairs?: No Weakness of Legs: None Weakness of Arms/Hands: None  Permission Sought/Granted                  Emotional Assessment Appearance:: Appears stated age Attitude/Demeanor/Rapport: Engaged Affect (typically observed): Accepting Orientation: : Oriented to Self, Oriented to Place, Oriented to  Time, Oriented to Situation   Psych Involvement: No (comment)  Admission diagnosis:  Perforated peptic ulcer ([place]) [K27.5] Patient Active Problem List   Diagnosis  Date Noted  . Perforated viscus 01/06/2019  . Severe sepsis with septic shock ([place]) 01/06/2019  . Acute renal failure ([place]) 01/06/2019  . Hyperkalemia 01/06/2019  . Hyponatremia 01/06/2019  . Lactic acidosis 01/06/2019  . Onychomycosis 05/19/2018  . Foot fracture, left, closed, initial encounter 05/09/2018  . Lisfranc dislocation, left, initial encounter   . Acute bronchitis  02/25/2018  . Primary pulmonary hypertension ([place]) 07/09/2017  . Diastolic dysfunction [id number]  . OSA (obstructive sleep apnea) 07/08/2017  . SOB (shortness of breath) 07/02/2017  . Chronic respiratory failure with hypoxia and hypercapnia ([place]) 07/02/2017  . Cardiomegaly 07/02/2017  . Exposure keratitis 02/17/2017  . Pedal edema 12/18/2016  . Transaminitis 12/18/2016  . Bilateral pes planus 12/18/2016  . Psoriasis-eczema overlap condition 07/30/2016  . High serum high density lipoprotein (HDL) 09/26/2015  . Epistaxis 09/26/2015  . Renal artery stenosis in 1 of 2 vessels ([place])   . Essential tremor   . Elevated PSA   . GERD (gastroesophageal reflux disease)   . Health maintenance examination 10/08/2014  . Anxiety state 10/08/2014  . Obesity, Class I, BMI 30-34.9   . Mild intermittent asthma in adult without complication   . Seasonal allergies   . HTN (hypertension)   . Scoliosis   . Osteoporosis    PCP:  Rutherford Guys, MD Pharmacy:   [facility] [address] [place] AT [place] [address] [place] [address] Phone: [phone] Fax: [place] [address] [place] [address] [place] [address] Phone: [phone] Fax: [phone]     Social Determinants of Health (SDOH) Interventions    Readmission Risk Interventions No flowsheet data found.

## 2019-01-09 NOTE — Progress Notes (Signed)
Louis [name]  A and O x 4 VSS. Pt tolerating diet well. No complaints of pain or nausea. IV removed intact, prescriptions given. Pt voices understanding of discharge instructions with no further questions. Pt discharged via wheelchair with axillary.   Allergies as of 01/09/2019      Reactions   Accupril [quinapril Hcl] Cough      Medication List    TAKE these medications   acetaminophen 325 MG tablet Commonly known as: Tylenol Take 2 tablets (650 mg total) by mouth every 8 (eight) hours as needed for mild pain.   ALPRAZolam 0.5 MG tablet Commonly known as: XANAX Take 1 tablet (0.5 mg total) by mouth daily as needed (essential tremor).   amLODipine 5 MG tablet Commonly known as: NORVASC TAKE 1 TABLET(5 MG) BY MOUTH DAILY What changed: See the new instructions.   docusate sodium 100 MG capsule Commonly known as: Colace Take 1 capsule (100 mg total) by mouth 2 (two) times daily as needed for up to 10 days for mild constipation.   gabapentin 300 MG capsule Commonly known as: NEURONTIN TAKE 1 CAPSULE BY MOUTH THREE TIMES DAILY WHEN NECESSARY What changed: See the new instructions.   HYDROcodone-acetaminophen 5-325 MG tablet Commonly known as: Norco Take 1 tablet by mouth every 6 (six) hours as needed for up to 6 doses for moderate pain.   losartan 100 MG tablet Commonly known as: COZAAR Take 1 tablet (100 mg total) by mouth daily. NEED OV.   metoprolol succinate 50 MG 24 hr tablet Commonly known as: TOPROL-XL TAKE 3 TABLETS BY MOUTH EVERY DAY. TAKE WITH OR IMMEDIATELY FOLLOWING A MEAL What changed: See the new instructions.   pantoprazole 40 MG tablet Commonly known as: Protonix Take 1 tablet (40 mg total) by mouth daily.   sertraline 25 MG tablet Commonly known as: ZOLOFT Take 1 tablet (25 mg total) by mouth daily. Start with 1/2 tablet once a day for a week, then increase to 1 tab daily   spironolactone 25 MG tablet Commonly known as: ALDACTONE TAKE 1 TABLET BY MOUTH  EVERY DAY       Vitals:   01/09/19 0449 01/09/19 1221  BP: (!) 155/82 137/86  Pulse: 74 73  Resp: 18 18  Temp: 98 F (36.7 C) 98.7 F (37.1 C)  SpO2: 96% 97%    Louis [name]

## 2019-01-09 NOTE — Evaluation (Signed)
Occupational Therapy Evaluation Patient Details Name: Louis [name] MRN: [id number] DOB: [birth year] Today's Date: 01/09/2019    History of Present Illness Pt admitted for complaints of severe R abdominal pain with + bowel perforation and is now post exploratory laproscopy with patch repair for perforated ulcer. Stay complicated by CCU admission and intubation from 1/4-1/5.    Clinical Impression   Pt seen for OT evaluation this date. Prior to hospital admission, pt was independent and is eager to return home and recover. Pt lives with his wife and dog and able to live on the main floor of the home with full bath. Currently pt demonstrates impairments in activity tolerance, BLE strength, and abdominal discomfort with hip/lumbar/thoracic flexion for LB ADL requiring PRN Min A for LB ADL and supervision for functional mobility. Pt instructed in falls prevention, modifications to ADL and IADL routines to improve safety, pet care considerations, AE/DME for ADL, and energy conservation strategies to maximize return to PLOF. Pt verbalized understanding. All education/training provided at time of initial evaluation. Do not anticipate additional OT services. Will sign off.     Follow Up Recommendations  No OT follow up    Equipment Recommendations  None recommended by OT    Recommendations for Other Services       Precautions / Restrictions Precautions Precautions: Fall Restrictions Weight Bearing Restrictions: No      Mobility Bed Mobility Overal bed mobility: Needs Assistance Bed Mobility: Supine to Sit;Sit to Supine     Supine to sit: Supervision Sit to supine: Supervision      Transfers Overall transfer level: Needs assistance Equipment used: None Transfers: Sit to/from Stand Sit to Stand: Supervision              Balance Overall balance assessment: Modified Independent                                         ADL either performed or assessed with  clinical judgement   ADL Overall ADL's : Needs assistance/impaired                                       General ADL Comments: PRN Min A for LB ADL 2/2 abdominal discomfort with hip/lumbar/thoracic flexion     Vision Patient Visual Report: No change from baseline       Perception     Praxis      Pertinent Vitals/Pain Pain Assessment: No/denies pain     Hand Dominance     Extremity/Trunk Assessment Upper Extremity Assessment Upper Extremity Assessment: Overall WFL for tasks assessed   Lower Extremity Assessment Lower Extremity Assessment: Generalized weakness   Cervical / Trunk Assessment Cervical / Trunk Assessment: Normal   Communication Communication Communication: No difficulties   Cognition Arousal/Alertness: Awake/alert Behavior During Therapy: WFL for tasks assessed/performed Overall Cognitive Status: Within Functional Limits for tasks assessed                                     General Comments       Exercises Other Exercises Other Exercises: Pt instructed in falls prevention, modifications to ADL and IADL routines to improve safety, pet care considerations, AE/DME for ADL, and energy conservation strategies to maximize return to PLOF  Shoulder Instructions      Home Living Family/patient expects to be discharged to:: Private residence Living Arrangements: Spouse/significant other Available Help at Discharge: Family;Available 24 hours/day Type of Home: House Home Access: Stairs to enter [employer] of Steps: 4 Entrance Stairs-Rails: Can reach both Home Layout: Able to live on main level with bedroom/bathroom     Bathroom Shower/Tub: Occupational psychologist: Standard     Home Equipment: Environmental consultant - 2 wheels;Cane - single point;Crutches;Bedside commode;Hand held shower head          Prior Functioning/Environment Level of Independence: Independent        Comments: previous active,  independent with household ambulation. 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 Reports recently has had issues with endurance. No falls        OT Problem List: Decreased strength;Impaired balance (sitting and/or standing);Decreased activity tolerance;Decreased knowledge of use of DME or AE      OT Treatment/Interventions:      OT Goals(Current goals can be found in the care plan section) Acute Rehab OT Goals Patient Stated Goal: to go home OT Goal Formulation: All assessment and education complete, DC therapy  OT Frequency:     Barriers to D/C:            Co-evaluation              AM-PAC OT "6 Clicks" Daily Activity     Outcome Measure Help from another person eating meals?: None Help from another person taking care of personal grooming?: None Help from another person toileting, which includes using toliet, bedpan, or urinal?: A Little Help from another person bathing (including washing, rinsing, drying)?: A Little Help from another person to put on and taking off regular upper body clothing?: None Help from another person to put on and taking off regular lower body clothing?: A Little 6 Click Score: 21   End of Session    Activity Tolerance: Patient tolerated treatment well Patient left: in bed;with bed alarm set;with call bell/phone within reach  OT Visit Diagnosis: Other abnormalities of gait and mobility (R26.89);Muscle weakness (generalized) (M62.81)                Time: [id number] OT Time Calculation (min): 13 min Charges:  OT General Charges $OT Visit: 1 Visit OT Evaluation $OT Eval Low Complexity: 1 Low  Jeni Salles, MPH, MS, OTR/L ascom [phone] 01/09/19, 9:38 AM

## 2019-01-09 NOTE — Discharge Instructions (Signed)
[place] patch repair, Care After This sheet gives you information about how to care for yourself after your procedure. Your health care provider may also give you more specific instructions. If you have problems or questions, contact your health care provider. What can I expect after the procedure? After your procedure, it is common to have the following:  Pain in your abdomen, especially in the incision areas. You will be given medicine to control the pain.  Tiredness. This is a normal part of the recovery process. Your energy level will return to normal over the next several weeks.  Changes in your bowel movements, such as constipation or needing to go more often. Talk with your health care provider about how to manage this. Follow these instructions at home: Medicines   tylenol as needed for discomfort.    Use narcotics, if prescribed, only when tylenol  is not enough to control pain.   325-650mg  every 8hrs to max of 4000mg /24hrs (including the 325mg  in every norco dose) for the tylenol.     NO NSAIDS  Do not drive or use heavy machinery while taking prescription pain medicine.  Do not drink alcohol while taking prescription pain medicine.  If you were prescribed an antibiotic medicine, use it as told by your health care provider. Do not stop using the antibiotic even if you start to feel better. Incision care     Follow instructions from your health care provider about how to take care of your incision areas. Make sure you: ? Keep your incisions clean and dry. ? Wash your hands with soap and water before and after applying medicine to the areas, and before and after changing your bandage (dressing). If soap and water are not available, use hand sanitizer. ? Change your dressing as told by your health care provider. ? Leave stitches (sutures), skin glue, or adhesive strips in place. These skin closures may need to stay in place for 2 weeks or longer. If adhesive strip edges start  to loosen and curl up, you may trim the loose edges. Do not remove adhesive strips completely unless your health care provider tells you to do that.  Do not wear tight clothing over the incisions. Tight clothing may rub and irritate the incision areas, which may cause the incisions to open.  Do not take baths, swim, or use a hot tub until your health care provider approves. OK TO SHOWER after 48hrs from drain removal.  Remove drain dressing and shower.  Keep area covered with band-aid until healed    Check your incision area every day for signs of infection. Check for: ? More redness, swelling, or pain. ? More fluid or blood. ? Warmth. ? Pus or a bad smell. Activity  Avoid lifting anything that is heavier than 10 lb (4.5 kg) for 2 weeks or until your health care provider says it is okay.  You may resume normal activities as told by your health care provider. Ask your health care provider what activities are safe for you.  Take rest breaks during the day as needed. Eating and drinking  Follow instructions from your health care provider about what you can eat after surgery.  To prevent or treat constipation while you are taking prescription pain medicine, your health care provider may recommend that you: ? Drink enough fluid to keep your urine clear or pale yellow. ? Take over-the-counter or prescription medicines. ? Eat foods that are high in fiber, such as fresh fruits and vegetables, whole grains, and  beans. ? Limit foods that are high in fat and processed sugars, such as fried and sweet foods. General instructions  Ask your health care provider when you will need an appointment to get your sutures or staples removed.  Keep all follow-up visits as told by your health care provider. This is important. Contact a health care provider if:  You have more redness, swelling, or pain around your incisions.  You have more fluid or blood coming from the incisions.  Your incisions feel  warm to the touch.  You have pus or a bad smell coming from your incisions or your dressing.  You have a fever.  You have an incision that breaks open (edges not staying together) after sutures or staples have been removed. Get help right away if:  You develop a rash.  You have chest pain or difficulty breathing.  You have pain or swelling in your legs.  You feel light-headed or you faint.  Your abdomen swells (becomes distended).  You have nausea or vomiting.  You have blood in your stool (feces). This information is not intended to replace advice given to you by your health care provider. Make sure you discuss any questions you have with your health care provider. Document Released: 07/07/2004 Document Revised: 09/06/2017 Document Reviewed: 09/19/2015 Elsevier Interactive Patient Education  2019 [employer].

## 2019-01-10 LAB — CULTURE, BLOOD (ROUTINE X 2)
Culture: NO GROWTH
Culture: NO GROWTH

## 2019-01-12 NOTE — Discharge Summary (Signed)
Physician Discharge Summary  [name] MRN: [id number] DOB/AGE: [birth year] 64 y.o.  Admit date: 01/05/2019 Discharge date: 01/12/2019  Admission Diagnoses: Perforated duodenal ulcer  Discharge Diagnoses:  Same as above  Discharged Condition: good  Hospital Course: Admitted for above.  Taken emergently to the operating room.  Please see operative note for further details.  Postop due to persistent hypotension, was placed in the ICU for closer monitoring and vasopressor requirements.  Hypotension was eventually resolved, patient was moved out of the ICU after requirements of pressors were lifted.  Patient stayed n.p.o. until a upper GI study confirmed no further leak from the [facility] patch repair that was performed.  NG tube was then removed and diet slowly advanced until patient discharge.  At time of discharge patient had minimal pain issues, rating a regular diet, and having bowel movements.  Of note, hemoglobin levels are slowly trending down, due to lack of visible bleeding from the JP drain site that was placed around the operative area, along with minimal symptomatology and toleration of diet, it was deemed safe to continue to monitor as an outpatient basis.  Prior to discharge the JP drain was removed due to low serous output.  PT evaluation noted benefit from home health PT which was arranged prior to discharge as well.  Consults: pulmonary/intensive care, PT  Discharge Exam: Blood pressure 137/86, pulse 73, temperature 98.7 F (37.1 C), temperature source Oral, resp. rate 18, height 5\' 11"  (1.803 m), weight 93.8 kg, SpO2 97 %. General appearance: alert, cooperative and no distress GI: Soft, no guarding, midline staples intact clean and dry.  JP with minimal serous drainage.  Post pull wound clean covered with 4 x 4 dressing and paper tape.  Disposition:  Discharge disposition: 01-Home or Self Care       Discharge Instructions    Discharge patient   Complete by: As  directed    Discharge disposition: 01-Home or Self Care   Discharge patient date: 01/09/2019     Allergies as of 01/09/2019      Reactions   Accupril [quinapril Hcl] Cough      Medication List    TAKE these medications   acetaminophen 325 MG tablet Commonly known as: Tylenol Take 2 tablets (650 mg total) by mouth every 8 (eight) hours as needed for mild pain.   ALPRAZolam 0.5 MG tablet Commonly known as: XANAX Take 1 tablet (0.5 mg total) by mouth daily as needed (essential tremor).   amLODipine 5 MG tablet Commonly known as: NORVASC TAKE 1 TABLET(5 MG) BY MOUTH DAILY What changed: See the new instructions.   docusate sodium 100 MG capsule Commonly known as: Colace Take 1 capsule (100 mg total) by mouth 2 (two) times daily as needed for up to 10 days for mild constipation.   gabapentin 300 MG capsule Commonly known as: NEURONTIN TAKE 1 CAPSULE BY MOUTH THREE TIMES DAILY WHEN NECESSARY What changed: See the new instructions.   HYDROcodone-acetaminophen 5-325 MG tablet Commonly known as: Norco Take 1 tablet by mouth every 6 (six) hours as needed for up to 6 doses for moderate pain.   losartan 100 MG tablet Commonly known as: COZAAR Take 1 tablet (100 mg total) by mouth daily. NEED OV.   metoprolol succinate 50 MG 24 hr tablet Commonly known as: TOPROL-XL TAKE 3 TABLETS BY MOUTH EVERY DAY. TAKE WITH OR IMMEDIATELY FOLLOWING A MEAL What changed: See the new instructions.   pantoprazole 40 MG tablet Commonly known as: Protonix Take  1 tablet (40 mg total) by mouth daily.   sertraline 25 MG tablet Commonly known as: ZOLOFT Take 1 tablet (25 mg total) by mouth daily. Start with 1/2 tablet once a day for a week, then increase to 1 tab daily   spironolactone 25 MG tablet Commonly known as: ALDACTONE TAKE 1 TABLET BY MOUTH EVERY DAY      Follow-up Information    Quintan Saldivar, DO. Go on 01/13/2019.   Specialty: Surgery Why: duodenal ulcer post op staple removal 10am  appointment Contact information: [place] [address] [phone]            Total time spent arranging discharge was >52min. Signed: Benjamine Sprague 01/12/2019, 5:00 PM

## 2019-01-19 ENCOUNTER — Telehealth: Payer: Self-pay | Admitting: Cardiovascular Disease

## 2019-01-19 NOTE — Telephone Encounter (Signed)
New Message  Pt called and stated that he had ankle surgery last week. With problems still with ankle should he change his dosage on spironolactone (ALDACTONE) 25 MG tablet  Please call to discuss

## 2019-01-19 NOTE — Telephone Encounter (Signed)
Follow Up  Pt was calling to return phone call made by [facility].  Please call back

## 2019-01-19 NOTE — Telephone Encounter (Signed)
Returned call to patient.  Patient states he had emergency surgery on 1/4 and was discharged on Friday.   States he has a lot of ankle edema since being home.   Denies SOB, CP, weight increase, abdominal swelling.     He believes his spironolactone may need to be increased.      Advised to elevate LE, apply compression stockings and would route to MD for review and medication recommendations.    Patient has virtual visit with Almyra Deforest PA next week, advised to keep as scheduled.

## 2019-01-19 NOTE — Telephone Encounter (Signed)
Left message to call back  

## 2019-01-21 NOTE — Telephone Encounter (Signed)
Patient followed up on his call from Monday. He states he is still waiting for advice from Dr. Claiborne Billings to see if he can increase the dosage of his Spironolactone. Please advise

## 2019-01-22 NOTE — Telephone Encounter (Signed)
Attempt to call patient, no answer and VM is full.

## 2019-01-22 NOTE — Telephone Encounter (Signed)
Okay to take an extra one half dose in the afternoon for 3 days if persistent edema.  However when seen in office next week may need additional diuretic

## 2019-01-22 NOTE — Telephone Encounter (Signed)
Returned call to patient-aware of recommendations and verbalized understanding.

## 2019-01-26 ENCOUNTER — Encounter: Payer: Self-pay | Admitting: Physician Assistant

## 2019-01-26 ENCOUNTER — Telehealth (INDEPENDENT_AMBULATORY_CARE_PROVIDER_SITE_OTHER): Payer: 59 | Admitting: Physician Assistant

## 2019-01-26 ENCOUNTER — Telehealth: Payer: Self-pay

## 2019-01-26 VITALS — BP 129/72 | HR 84 | Ht 71.0 in | Wt 220.0 lb

## 2019-01-26 DIAGNOSIS — R0600 Dyspnea, unspecified: Secondary | ICD-10-CM

## 2019-01-26 DIAGNOSIS — R0609 Other forms of dyspnea: Secondary | ICD-10-CM

## 2019-01-26 DIAGNOSIS — J449 Chronic obstructive pulmonary disease, unspecified: Secondary | ICD-10-CM

## 2019-01-26 DIAGNOSIS — M7989 Other specified soft tissue disorders: Secondary | ICD-10-CM

## 2019-01-26 DIAGNOSIS — I10 Essential (primary) hypertension: Secondary | ICD-10-CM

## 2019-01-26 MED ORDER — METOPROLOL SUCCINATE ER 50 MG PO TB24: 150.0000 mg | ORAL_TABLET | Freq: Every day | ORAL | 3 refills | Status: DC

## 2019-01-26 MED ORDER — LOSARTAN POTASSIUM 100 MG PO TABS: 100.0000 mg | ORAL_TABLET | Freq: Every day | ORAL | 3 refills | Status: DC

## 2019-01-26 NOTE — Telephone Encounter (Signed)

## 2019-01-26 NOTE — Patient Instructions (Signed)
Medication Instructions:  Your physician recommends that you continue on your current medications as directed. Please refer to the Current Medication list given to you today.  *If you need a refill on your cardiac medications before your next appointment, please call your pharmacy*  Lab Work: NONE ordered at this time of appointment   If you have labs (blood work) drawn today and your tests are completely normal, you will receive your results only by: [name] MyChart Message (if you have MyChart) OR . A paper copy in the mail If you have any lab test that is abnormal or we need to change your treatment, we will call you to review the results.  Testing/Procedures: NONE ordered at this time of appointment   Follow-Up: At [facility], you and your health needs are our priority.  As part of our continuing mission to provide you with exceptional heart care, we have created designated Provider Care Teams.  These Care Teams include your primary Cardiologist (physician) and Advanced Practice Providers (APPs -  Physician Assistants and Nurse Practitioners) who all work together to provide you with the care you need, when you need it.  Your next appointment:   6-9 month(s)  The format for your next appointment:   In Person  Provider:   Shelva Majestic, MD  Other Instructions

## 2019-01-26 NOTE — Progress Notes (Signed)
Virtual Visit via Telephone Note   This visit type was conducted due to national recommendations for restrictions regarding the COVID-19 Pandemic (e.g. social distancing) in an effort to limit this patient's exposure and mitigate transmission in our community.  Due to his co-morbid illnesses, this patient is at least at moderate risk for complications without adequate follow up.  This format is felt to be most appropriate for this patient at this time.  The patient did not have access to video technology/had technical difficulties with video requiring transitioning to audio format only (telephone).  All issues noted in this document were discussed and addressed.  No physical exam could be performed with this format.  Please refer to the patient's chart for his  consent to telehealth for [name].   Date:  01/28/2019   ID:  Louis [name], DOB [birth year], MRN [id number]  Patient Location: Home Provider Location: Office  PCP:  Rutherford Guys, MD  Cardiologist:  Shelva Majestic, MD  Electrophysiologist:  None   Evaluation Performed:  Follow-Up Visit  Chief Complaint:  followup  History of Present Illness:    TUFF MATURO is a 64 y.o. male with past medical history of BPH, COPD, OSA, hypertension, severe scoliosis and obesity.  Previous Doppler in February 2011 showed a normal LV size, EF greater than [id number], normal diastolic parameters, mild MR and trace tricuspid insufficiency.  Patient was referred to cardiology service again in September 2019 for increasing dyspnea.  Prior to that, patient was hospitalized following a trip to [place] in July 2019.  He developed a significant hypoxia and hypercarbia required transient BiPAP therapy.  CT angiogram of the chest showed a severe scoliosis and suggestion of pulmonary hypertension however no PE.  There was also evidence of aortic atherosclerosis.  Echocardiogram suggested normal systolic function, grade 2 DD, decreased RV function with normal PA  pressure.  He subsequently underwent sleep study and found to have severe obstructive sleep apnea and started on CPAP therapy.  He is being followed by [name] from pulmonary standpoint.  PFT obtained in August 2019 showed FEV1 of 61%, FVC 65% without significant bronchodilator response.  He was last seen by Dr. Claiborne Billings in December 2019 at which time he was breathing much better.  Patient was contacted today for cardiology office visit.  He just recently recovered from a ruptured bowel that was fixed by Dr. Lysle Pearl.  He underwent exploratory laparotomy and abdominal washout with [name] repair of perforated duodenal ulcer.  He continued to have some abdominal tenderness however has been able to ambulate at home without any issue.  He denies any chest pain.  He does have some dyspnea with exertion however this is likely related to deconditioning secondary to recent hospitalization.  He also was previously immobile for 3 months due to a Lisfranc fracture of the foot during the summer 2020.  Due to the recent leg swelling, he has increased his spironolactone to 25 mg in a.m. and of 12.5 mg in p.m.  This has helped with his lower extremity edema.  I suspect his edema is related to IV fluid intake during the perioperative phase.  I recommended increased spironolactone for 10 days before going back to the previous dose.  The patient does not have symptoms concerning for COVID-19 infection (fever, chills, cough, or new shortness of breath).    Past Medical History:  Diagnosis Date  . Anxiety    situational  . BPH (benign prostatic hyperplasia)    on flomax  .  Bundle branch block    per prior PCP records  . CHF (congestive heart failure) ([place])   . COPD (chronic obstructive pulmonary disease) ([place])   . Diverticulosis    by colonoscopy  . Eczema    per prior PCP records  . Elevated PSA 2015   peaked 6s, s/p benign biopsy 2014, sees urology [name] ([place])  . Essential tremor   . GERD  (gastroesophageal reflux disease)    per prior PCP records  . History of panic attacks    per prior PCP records  . HTN (hypertension)   . Mild intermittent asthma in adult without complication   . Obesity, Class I, BMI 30-34.9   . Osteoporosis    DEXA 06/2013 with osteopenia - h/o ?wrist/hip fracture from AVN from chronic steroid use (asthma), took reclast for 1 year  . Renal artery stenosis in 1 of 2 vessels ([place]) 2011   by [place]  . Seasonal allergies   . Sleep apnea   . Thoracic scoliosis childhood  . Transaminitis    per prior PCP records   Past Surgical History:  Procedure Laterality Date  . APPLICATION OF WOUND VAC Left 05/09/2018   Procedure: Application Of Wound Vac;  Surgeon: Newt Minion, MD;  Location: [place];  Service: Orthopedics;  Laterality: Left;  . COLONOSCOPY  12/2013   polyps, int hem, diverticulosis, rpt 5 yrs ([place])  . ELBOW SURGERY Right 2008   golfer's elbow  . INGUINAL HERNIA REPAIR Bilateral childhood & ~1995  . LAPAROTOMY N/A 01/05/2019   Procedure: EXPLORATORY LAPAROTOMY graham patch repair;  Surgeon: Benjamine Sprague, DO;  Location: [facility];  Service: General;  Laterality: N/A;  . NASAL SEPTUM SURGERY  2013   deviated septum  . ORIF ANKLE FRACTURE Left 05/09/2018   Procedure: OPEN REDUCTION INTERNAL FIXATION LEFT LISFRANC FRACTURE/DISLOCATION;  Surgeon: Newt Minion, MD;  Location: [place];  Service: Orthopedics;  Laterality: Left;  . US ECHOCARDIOGRAPHY  02/2009   WNL, EF >55% Claiborne Billings)  . US RENAL/AORTA Right 05/2009   1-59% diameter reduction renal artery, rec rpt 2 yrs     Current Meds  Medication Sig  . ALPRAZolam (XANAX) 0.5 MG tablet Take 1 tablet (0.5 mg total) by mouth daily as needed (essential tremor).  [name] amLODipine (NORVASC) 5 MG tablet TAKE 1 TABLET(5 MG) BY MOUTH DAILY (Patient taking differently: Take 2.5 mg by mouth daily. )  . losartan (COZAAR) 100 MG tablet Take 1 tablet (100 mg total) by mouth daily. NEED OV.  . metoprolol succinate (TOPROL-XL)  50 MG 24 hr tablet Take 3 tablets (150 mg total) by mouth daily. Take with or immediately following a meal.  . pantoprazole (PROTONIX) 40 MG tablet Take 1 tablet (40 mg total) by mouth daily.  [name] spironolactone (ALDACTONE) 25 MG tablet TAKE 1 TABLET BY MOUTH EVERY DAY (Patient taking differently: Take 25 mg by mouth daily. Take 1 tablet in the mornings and half tablet in the evenings)  . [DISCONTINUED] losartan (COZAAR) 100 MG tablet Take 1 tablet (100 mg total) by mouth daily. NEED OV.  . [DISCONTINUED] metoprolol succinate (TOPROL-XL) 50 MG 24 hr tablet TAKE 3 TABLETS BY MOUTH EVERY DAY. TAKE WITH OR IMMEDIATELY FOLLOWING A MEAL (Patient taking differently: Take 150 mg by mouth daily. )     Allergies:   Accupril [quinapril hcl]   Social History   Tobacco Use  . Smoking status: Never Smoker  . Smokeless tobacco: Never Used  Substance Use Topics  . Alcohol use:  Yes    Alcohol/week: 14.0 standard drinks    Types: 14 Glasses of wine per week    Comment: Social  . Drug use: No     Family Hx: The patient's family history includes Alcohol abuse in his mother; Ataxia in his brother and sister; Cancer in his maternal grandmother; Cancer (age of onset: 54) in his father; Diabetes in his mother; Prostate cancer in his father; Stroke in his mother. There is no history of CAD.  ROS:   Please see the history of present illness.     All other systems reviewed and are negative.   Prior CV studies:   The following studies were reviewed today:  Echo 07/03/2017 LV EF: 60% -   65%  ------------------------------------------------------------------- History:   PMH:  Pulmonary Hypertension 416.8.  Risk factors: bundle branch block. Hypertension.  ------------------------------------------------------------------- Study Conclusions  - Left ventricle: The cavity size was normal. There was mild   concentric hypertrophy. Systolic function was normal. The   estimated ejection fraction was in the  range of 60% to 65%. Wall   motion was normal; there were no regional wall motion   abnormalities. Features are consistent with a pseudonormal left   ventricular filling pattern, with concomitant abnormal relaxation   and increased filling pressure (grade 2 diastolic dysfunction).   There was no evidence of elevated ventricular filling pressure by   Doppler parameters. - Aortic valve: There was no regurgitation. - Mitral valve: There was no regurgitation. - Left atrium: The atrium was moderately dilated. - Right ventricle: The cavity size was moderately dilated. Wall   thickness was normal. Systolic function was mildly reduced. - Right atrium: The atrium was normal in size. - Pulmonic valve: There was trivial regurgitation. - Inferior vena cava: The vessel was normal in size. - Pericardium, extracardiac: A trivial pericardial effusion was   identified posterior to the heart. Features were not consistent   with tamponade physiology.  Labs/Other Tests and Data Reviewed:    EKG:  An ECG dated 01/06/2019 was personally reviewed today and demonstrated:  Normal sinus rhythm with T wave inversion in anterior leads.  Recent Labs: 01/06/2019: ALT 101 01/07/2019: Magnesium 1.9 01/08/2019: Platelets 195 01/09/2019: BUN 16; Creatinine, Ser 1.08; Hemoglobin 8.9; Potassium 4.5; Sodium 133   Recent Lipid Panel Lab Results  Component Value Date/Time   CHOL 214 (H) 09/26/2015 01:28 PM   CHOL 204 10/11/2014 12:00 AM   TRIG 42 01/06/2019 01:32 AM   TRIG 51 10/11/2014 12:00 AM   HDL 100.60 09/26/2015 01:28 PM   HDL 75 10/11/2014 12:00 AM   CHOLHDL 2 09/26/2015 01:28 PM   LDLCALC 101 (H) 09/26/2015 01:28 PM   LDLCALC 119 10/11/2014 12:00 AM    Wt Readings from Last 3 Encounters:  01/26/19 220 lb (99.8 kg)  01/05/19 206 lb 12.7 oz (93.8 kg)  08/05/18 220 lb (99.8 kg)     Objective:    Vital Signs:  BP 129/72   Pulse 84   Ht 5\' 11"  (1.803 m)   Wt 220 lb (99.8 kg)   SpO2 97%   BMI 30.68 kg/m      VITAL SIGNS:  reviewed  ASSESSMENT & PLAN:    1. Dyspnea on exertion: I suspect this is related to deconditioning from recent hospitalization.  Prior to the recent hospitalization, patient also suffered a foot fracture in the summer 2020 which resulted in immobility for 3 months.  2. Leg edema: He did not have any leg edema prior to the  recent hospitalization.  He underwent surgery for perforated bowel secondary to peptic ulcer disease.  I suspect her leg edema is related to the IV fluid that was given during perioperative phase.  Since he increased spironolactone to 25 mg a.m. and at 12.5 mg p.m., he has noticed improvement to his leg edema.  I recommended finish a 10-day course on the higher dose of spironolactone before going back to the previous dose of 25 mg daily of spironolactone.  3. COPD: Followed by [name]  4. Hypertension: Blood pressure stable on current therapy.  COVID-19 Education: The signs and symptoms of COVID-19 were discussed with the patient and how to seek care for testing (follow up with PCP or arrange E-visit).  The importance of social distancing was discussed today.  Time:   Today, I have spent 17 minutes with the patient with telehealth technology discussing the above problems.     Medication Adjustments/Labs and Tests Ordered: Current medicines are reviewed at length with the patient today.  Concerns regarding medicines are outlined above.   Tests Ordered: No orders of the defined types were placed in this encounter.   Medication Changes: Meds ordered this encounter  Medications  . losartan (COZAAR) 100 MG tablet    Sig: Take 1 tablet (100 mg total) by mouth daily. NEED OV.    Dispense:  90 tablet    Refill:  3  . metoprolol succinate (TOPROL-XL) 50 MG 24 hr tablet    Sig: Take 3 tablets (150 mg total) by mouth daily. Take with or immediately following a meal.    Dispense:  270 tablet    Refill:  3    Follow Up:  Either In Person or Virtual in 6  month(s)  Signed, Almyra Deforest, [place]  01/28/2019 11:21 PM    [place]

## 2019-02-10 ENCOUNTER — Other Ambulatory Visit: Payer: Self-pay | Admitting: Gastroenterology

## 2019-03-11 DIAGNOSIS — T8859XA Other complications of anesthesia, initial encounter: Secondary | ICD-10-CM

## 2019-03-11 HISTORY — DX: Other complications of anesthesia, initial encounter: T88.59XA

## 2019-04-27 ENCOUNTER — Other Ambulatory Visit (HOSPITAL_COMMUNITY)
Admission: RE | Admit: 2019-04-27 | Discharge: 2019-04-27 | Disposition: A | Discharge status: Home / Self Care | Payer: 59 | Source: Ambulatory Visit | Attending: Gastroenterology | Admitting: Gastroenterology

## 2019-04-27 DIAGNOSIS — Z20822 Contact with and (suspected) exposure to covid-19: Secondary | ICD-10-CM | POA: Insufficient documentation

## 2019-04-27 DIAGNOSIS — Z01812 Encounter for preprocedural laboratory examination: Secondary | ICD-10-CM | POA: Insufficient documentation

## 2019-04-27 LAB — SARS CORONAVIRUS 2 (TAT 6-24 HRS): SARS Coronavirus 2: NEGATIVE

## 2019-04-29 NOTE — Anesthesia Preprocedure Evaluation (Addendum)
Anesthesia Evaluation  Patient identified by MRN, date of birth, ID band Patient awake    Reviewed: Allergy & Precautions, H&P , NPO status , [name] Chart, lab work & pertinent test results, reviewed documented beta blocker date and time   Airway Mallampati: III  TM Distance: >3 FB Neck ROM: full    Dental  (+) Teeth Intact, Dental Advisory Given   Pulmonary shortness of breath and with exertion, asthma , sleep apnea, Continuous Positive Airway Pressure Ventilation and Oxygen sleep apnea , COPD,  COPD inhaler,    Pulmonary exam normal        Cardiovascular Exercise Tolerance: Poor hypertension, On Medications, Pt. on medications and Pt. on home beta blockers + Peripheral Vascular Disease and +CHF  Normal cardiovascular exam+ dysrhythmias  Rhythm:regular Rate:Normal     Neuro/Psych Anxiety negative psych ROS   GI/Hepatic Bowel prep,GERD  Medicated and Controlled,(+)     substance abuse  alcohol use, Screening c scope, hx PUD Emergent abdominal surgery at [facility] in January 2021 with graham patch placed    Endo/Other  negative endocrine ROS  Renal/GU Renal diseaseRenal artery stenosis   negative genitourinary   Musculoskeletal negative musculoskeletal ROS (+)   Abdominal Normal abdominal exam  (+)   Peds  Hematology negative hematology ROS (+)   Anesthesia Other Findings   Reproductive/Obstetrics negative OB ROS                            Anesthesia Physical Anesthesia Plan  ASA: III  Anesthesia Plan: MAC   Post-op Pain Management:    Induction:   PONV Risk Score and Plan: 2 and Propofol infusion and TIVA  Airway Management Planned: Natural Airway and Simple Face Mask  Additional Equipment: None  Intra-op Plan:   Post-operative Plan:   Informed Consent: I have reviewed the patients History and Physical, chart, labs and discussed the procedure including the risks,  benefits and alternatives for the proposed anesthesia with the patient or authorized representative who has indicated his/her understanding and acceptance.       Plan Discussed with: CRNA  Anesthesia Plan Comments:         Anesthesia Quick Evaluation

## 2019-04-30 ENCOUNTER — Encounter (HOSPITAL_COMMUNITY): Payer: Self-pay | Admitting: Gastroenterology

## 2019-04-30 ENCOUNTER — Ambulatory Visit (HOSPITAL_COMMUNITY): Payer: 59 | Admitting: Anesthesiology

## 2019-04-30 ENCOUNTER — Ambulatory Visit (HOSPITAL_COMMUNITY)
Admission: RE | Admit: 2019-04-30 | Discharge: 2019-04-30 | Disposition: A | Discharge status: Home / Self Care | Payer: 59 | Attending: Gastroenterology | Admitting: Gastroenterology

## 2019-04-30 ENCOUNTER — Other Ambulatory Visit: Payer: Self-pay

## 2019-04-30 ENCOUNTER — Encounter (HOSPITAL_COMMUNITY)
Admission: RE | Disposition: A | Discharge status: Home / Self Care | Payer: Self-pay | Source: Home / Self Care | Attending: Gastroenterology

## 2019-04-30 DIAGNOSIS — D124 Benign neoplasm of descending colon: Secondary | ICD-10-CM | POA: Diagnosis not present

## 2019-04-30 DIAGNOSIS — Z888 Allergy status to other drugs, medicaments and biological substances status: Secondary | ICD-10-CM | POA: Insufficient documentation

## 2019-04-30 DIAGNOSIS — I5030 Unspecified diastolic (congestive) heart failure: Secondary | ICD-10-CM | POA: Insufficient documentation

## 2019-04-30 DIAGNOSIS — F419 Anxiety disorder, unspecified: Secondary | ICD-10-CM | POA: Diagnosis not present

## 2019-04-30 DIAGNOSIS — I110 Hypertensive heart disease with heart failure: Secondary | ICD-10-CM | POA: Insufficient documentation

## 2019-04-30 DIAGNOSIS — D128 Benign neoplasm of rectum: Secondary | ICD-10-CM | POA: Insufficient documentation

## 2019-04-30 DIAGNOSIS — Z86010 Personal history of colonic polyps: Secondary | ICD-10-CM | POA: Diagnosis not present

## 2019-04-30 DIAGNOSIS — G4733 Obstructive sleep apnea (adult) (pediatric): Secondary | ICD-10-CM | POA: Insufficient documentation

## 2019-04-30 DIAGNOSIS — K648 Other hemorrhoids: Secondary | ICD-10-CM | POA: Diagnosis not present

## 2019-04-30 DIAGNOSIS — I701 Atherosclerosis of renal artery: Secondary | ICD-10-CM | POA: Diagnosis not present

## 2019-04-30 DIAGNOSIS — Z6830 Body mass index (BMI) 30.0-30.9, adult: Secondary | ICD-10-CM | POA: Insufficient documentation

## 2019-04-30 DIAGNOSIS — K5730 Diverticulosis of large intestine without perforation or abscess without bleeding: Secondary | ICD-10-CM | POA: Insufficient documentation

## 2019-04-30 DIAGNOSIS — J449 Chronic obstructive pulmonary disease, unspecified: Secondary | ICD-10-CM | POA: Diagnosis not present

## 2019-04-30 DIAGNOSIS — K760 Fatty (change of) liver, not elsewhere classified: Secondary | ICD-10-CM | POA: Insufficient documentation

## 2019-04-30 DIAGNOSIS — R0602 Shortness of breath: Secondary | ICD-10-CM | POA: Diagnosis not present

## 2019-04-30 DIAGNOSIS — Z8711 Personal history of peptic ulcer disease: Secondary | ICD-10-CM | POA: Diagnosis not present

## 2019-04-30 DIAGNOSIS — N400 Enlarged prostate without lower urinary tract symptoms: Secondary | ICD-10-CM | POA: Diagnosis not present

## 2019-04-30 DIAGNOSIS — Z833 Family history of diabetes mellitus: Secondary | ICD-10-CM | POA: Insufficient documentation

## 2019-04-30 DIAGNOSIS — K219 Gastro-esophageal reflux disease without esophagitis: Secondary | ICD-10-CM | POA: Diagnosis not present

## 2019-04-30 DIAGNOSIS — I454 Nonspecific intraventricular block: Secondary | ICD-10-CM | POA: Insufficient documentation

## 2019-04-30 DIAGNOSIS — K2100 Gastro-esophageal reflux disease with esophagitis, without bleeding: Secondary | ICD-10-CM | POA: Diagnosis not present

## 2019-04-30 DIAGNOSIS — K2950 Unspecified chronic gastritis without bleeding: Secondary | ICD-10-CM | POA: Diagnosis not present

## 2019-04-30 DIAGNOSIS — D123 Benign neoplasm of transverse colon: Secondary | ICD-10-CM | POA: Diagnosis not present

## 2019-04-30 DIAGNOSIS — Z800 Family history of malignant neoplasm of digestive organs: Secondary | ICD-10-CM | POA: Insufficient documentation

## 2019-04-30 DIAGNOSIS — Z1211 Encounter for screening for malignant neoplasm of colon: Secondary | ICD-10-CM | POA: Insufficient documentation

## 2019-04-30 DIAGNOSIS — Z811 Family history of alcohol abuse and dependence: Secondary | ICD-10-CM | POA: Insufficient documentation

## 2019-04-30 DIAGNOSIS — E669 Obesity, unspecified: Secondary | ICD-10-CM | POA: Insufficient documentation

## 2019-04-30 DIAGNOSIS — M419 Scoliosis, unspecified: Secondary | ICD-10-CM | POA: Insufficient documentation

## 2019-04-30 DIAGNOSIS — Z79899 Other long term (current) drug therapy: Secondary | ICD-10-CM | POA: Insufficient documentation

## 2019-04-30 DIAGNOSIS — Z8489 Family history of other specified conditions: Secondary | ICD-10-CM | POA: Insufficient documentation

## 2019-04-30 HISTORY — PX: POLYPECTOMY: SHX5525

## 2019-04-30 HISTORY — PX: COLONOSCOPY WITH PROPOFOL: SHX5780

## 2019-04-30 HISTORY — PX: BIOPSY: SHX5522

## 2019-04-30 HISTORY — PX: ESOPHAGOGASTRODUODENOSCOPY (EGD) WITH PROPOFOL: SHX5813

## 2019-04-30 SURGERY — ESOPHAGOGASTRODUODENOSCOPY (EGD) WITH PROPOFOL
Anesthesia: Monitor Anesthesia Care

## 2019-04-30 MED ORDER — EPHEDRINE SULFATE 50 MG/ML IJ SOLN
INTRAMUSCULAR | Status: DC | PRN
  Administered 2019-04-30: 5 mg via INTRAVENOUS

## 2019-04-30 MED ORDER — SODIUM CHLORIDE 0.9 % IV SOLN: INTRAVENOUS | Status: DC

## 2019-04-30 MED ORDER — LACTATED RINGERS IV SOLN
INTRAVENOUS | Status: AC | PRN
  Administered 2019-04-30: 1000 mL via INTRAVENOUS

## 2019-04-30 MED ORDER — PROPOFOL 500 MG/50ML IV EMUL
INTRAVENOUS | Status: DC | PRN
  Administered 2019-04-30: 125 ug/kg/min via INTRAVENOUS

## 2019-04-30 MED ORDER — PROPOFOL 500 MG/50ML IV EMUL
INTRAVENOUS | Status: DC | PRN
  Administered 2019-04-30: 10 mg via INTRAVENOUS
  Administered 2019-04-30: 50 mg via INTRAVENOUS
  Administered 2019-04-30: 10 mg via INTRAVENOUS
  Administered 2019-04-30: 20 mg via INTRAVENOUS

## 2019-04-30 SURGICAL SUPPLY — 24 items

## 2019-04-30 NOTE — Op Note (Signed)
[facility] Patient Name: Louis [name] Procedure Date: 04/30/2019 MRN: [id number] Attending MD: Juanita Craver , MD Date of Birth: [birth year] CSN: [id number] Age: 64 Admit Type: Outpatient Procedure:                EGD with antral biopsies. Indications:              Gastro-esophageal reflux disease, History of a                            perforated duodenal ulcer. Providers:                Juanita Craver, MD, Angus Seller, RN, Elspeth Cho                            Technician, Technician, Rosario Adie, CRNA Referring MD:             Lilia Argue. Pamella Pert, MD Medicines:                Monitored Anesthesia Care Complications:            No immediate complications. Estimated Blood Loss:     Estimated blood loss was minimal. Procedure:                Pre-Anesthesia Assessment: - Prior to the                            procedure, a history and physical was performed,                            and patient medications and allergies were                            reviewed. The patient's tolerance of previous                            anesthesia was also reviewed. The risks and                            benefits of the procedure and the sedation options                            and risks were discussed with the patient. All                            questions were answered, and informed consent was                            obtained. Prior Anticoagulants: The patient has                            taken no previous anticoagulant or antiplatelet                            agents. ASA Grade Assessment: III - A patient with  severe systemic disease. After reviewing the risks                            and benefits, the patient was deemed in                            satisfactory condition to undergo the procedure.                            After obtaining informed consent, the endoscope was                            passed under direct vision.  Throughout the                            procedure, the patient's blood pressure, pulse, and                            oxygen saturations were monitored continuously. The                            GIF-H190 ([id number] Olympus gastroscope was                            introduced through the mouth, and advanced to the                            second part of duodenum. The EGD was accomplished                            without difficulty. The patient tolerated the                            procedure well. Scope In: Scope Out: Findings:      The examined esophagus and GEJ appeared widely patent and normal.      Patchy mild inflammation characterized by erythema, friability and       granularity was found in the entire examined stomach-biopsies were taken       to rule out H. pylori by pathology.      The cardia and gastric fundus were normal on retroflexion.      The examined duodenum was normal. Impression:               - Normal appearing, widely patent esophagus.                           - Patchy gastritis in the midbody-biopsies done for                            H. pylori by pathology.                           - Normal examined duodenum. Moderate Sedation:      MAC used. Recommendation:           - High fiber, low fat  diet with augmented water                            consumption daily.                           - Continue present medications.                           - Await pathology results.                           - Return to GI office PRN.                           - If the patient has any abnormal GI symptoms in                            the interim, he has been advised to call the office                            ASAP for further recommendations. Procedure Code(s):        --- Professional ---                           [phone], Esophagogastroduodenoscopy, flexible,                            transoral; with biopsy, single or multiple Diagnosis Code(s):        ---  Professional ---                           K21.9, Gastro-esophageal reflux disease without                            esophagitis                           K29.70, Gastritis, unspecified, without bleeding CPT copyright 2019 American Medical Association. All rights reserved. The codes documented in this report are preliminary and upon coder review may  be revised to meet current compliance requirements. Juanita Craver, MD Juanita Craver, MD 04/30/2019 8:05:48 AM This report has been signed electronically. Number of Addenda: 0

## 2019-04-30 NOTE — Transfer of Care (Signed)
Immediate Anesthesia Transfer of Care Note  Patient: Louis [name]  Procedure(s) Performed: ESOPHAGOGASTRODUODENOSCOPY (EGD) WITH PROPOFOL (N/A ) COLONOSCOPY WITH PROPOFOL (N/A ) BIOPSY POLYPECTOMY  Patient Location: PACU  Anesthesia Type:MAC  Level of Consciousness: drowsy, patient cooperative and responds to stimulation  Airway & Oxygen Therapy: Patient Spontanous Breathing and Patient connected to face mask oxygen  Post-op Assessment: Report given to RN and Post -op Vital signs reviewed and stable  Post vital signs: Reviewed and stable  Last Vitals:  Vitals Value Taken Time  BP    Temp    Pulse 62 04/30/19 0757  Resp 20 04/30/19 0757  SpO2 99 % 04/30/19 0757  Vitals shown include unvalidated device data.  Last Pain:  Vitals:   04/30/19 0650  TempSrc: Oral  PainSc: 0-No pain         Complications: No apparent anesthesia complications

## 2019-04-30 NOTE — Anesthesia Procedure Notes (Signed)
Date/Time: 04/30/2019 7:17 AM Performed by: Glory Buff, CRNA Oxygen Delivery Method: Simple face mask

## 2019-04-30 NOTE — H&P (Signed)
Louis [name] is an 64 y.o. male.   Chief Complaint: Colorectal cancer screening. HPI: [name] is a 64 year old white male with multiple medical problems listed below who presents to the [facility] long hospital today for an EGD and a colonoscopy.  His history is complicated by the fact that he had a perforated duodenal ulcer on 01/05/2019 when he presented to the office with severe abdominal pain and was sent to the emergency room for further evaluation.  A perforated viscus was noted on the scan and he was taken to the operating room that evening.  During surgery was found to have a duodenal ulcer that had perforated and Graham's patch was placed over the perforation.  Patient has done well since then he denies having any abdominal pain nausea vomiting at this time.  He had a stool antigen done postoperatively for H. pylori that was negative.  His father was diagnosed with colon cancer in his 54's.  Past Medical History:  Diagnosis Date  . Anxiety    situational  . BPH (benign prostatic hyperplasia)    on flomax  . Bundle branch block    per prior PCP records  . CHF (congestive heart failure) ([place])   . COPD (chronic obstructive pulmonary disease) ([place])   . Diverticulosis    by colonoscopy  . Eczema    per prior PCP records  . Elevated PSA 2015   peaked 6s, s/p benign biopsy 2014, sees urology [name] ([place])  . Essential tremor   . GERD (gastroesophageal reflux disease)    per prior PCP records  . History of panic attacks    per prior PCP records  . HTN (hypertension)   . Mild intermittent asthma in adult without complication   . Obesity, Class I, BMI 30-34.9   . Osteoporosis    DEXA 06/2013 with osteopenia - h/o ?wrist/hip fracture from AVN from chronic steroid use (asthma), took reclast for 1 year  . Renal artery stenosis in 1 of 2 vessels ([place]) 2011   by [place]  . Seasonal allergies   . Sleep apnea   . Thoracic scoliosis childhood  . Transaminitis    per prior PCP records    Past Surgical History:  Procedure Laterality Date  . APPLICATION OF WOUND VAC Left 05/09/2018   Procedure: Application Of Wound Vac;  Surgeon: Newt Minion, MD;  Location: [place];  Service: Orthopedics;  Laterality: Left;  . COLONOSCOPY  12/2013   polyps, int hem, diverticulosis, rpt 5 yrs ([place])  . ELBOW SURGERY Right 2008   golfer's elbow  . INGUINAL HERNIA REPAIR Bilateral childhood & ~1995  . LAPAROTOMY N/A 01/05/2019   Procedure: EXPLORATORY LAPAROTOMY graham patch repair;  Surgeon: Benjamine Sprague, DO;  Location: [facility];  Service: General;  Laterality: N/A;  . NASAL SEPTUM SURGERY  2013   deviated septum  . ORIF ANKLE FRACTURE Left 05/09/2018   Procedure: OPEN REDUCTION INTERNAL FIXATION LEFT LISFRANC FRACTURE/DISLOCATION;  Surgeon: Newt Minion, MD;  Location: [place];  Service: Orthopedics;  Laterality: Left;  . US ECHOCARDIOGRAPHY  02/2009   WNL, EF >55% Claiborne [place])  . US RENAL/AORTA Right 05/2009   1-59% diameter reduction renal artery, rec rpt 2 yrs   Family History  Problem Relation Age of Onset  . Cancer Father 89       colon  . Prostate cancer Father   . Diabetes Mother   . Alcohol abuse Mother   . Stroke Mother   . Cancer Maternal Grandmother  pancreatic  . Ataxia Brother   . Ataxia Sister   . CAD Neg Hx    Social History:  reports that he has never smoked. He has never used smokeless tobacco. He reports current alcohol use of about 14.0 standard drinks of alcohol per week. He reports that he does not use drugs.  Allergies:  Allergies  Allergen Reactions  . Accupril [Quinapril Hcl] Cough   Medications Prior to Admission  Medication Sig Dispense Refill  . ALPRAZolam (XANAX) 0.5 MG tablet Take 1 tablet (0.5 mg total) by mouth daily as needed (essential tremor). 20 tablet 0  . amLODipine (NORVASC) 5 MG tablet TAKE 1 TABLET(5 MG) BY MOUTH DAILY (Patient taking differently: Take 5 mg by mouth daily. ) 90 tablet 2  . gabapentin (NEURONTIN) 300 MG capsule Take 300  mg by mouth 2 (two) times daily.    [name] losartan (COZAAR) 100 MG tablet Take 1 tablet (100 mg total) by mouth daily. NEED OV. 90 tablet 3  . metoprolol succinate (TOPROL-XL) 50 MG 24 hr tablet Take 3 tablets (150 mg total) by mouth daily. Take with or immediately following a meal. 270 tablet 3  . pantoprazole (PROTONIX) 40 MG tablet Take 1 tablet (40 mg total) by mouth daily. 30 tablet 1  . spironolactone (ALDACTONE) 25 MG tablet TAKE 1 TABLET BY MOUTH EVERY DAY (Patient taking differently: Take 25 mg by mouth daily. ) 90 tablet 3   Review of Systems  Constitutional: Negative.   HENT: Negative.   Eyes: Negative.   Respiratory: Negative.   Cardiovascular: Negative.   Gastrointestinal: Negative.   Endocrine: Negative.   Allergic/Immunologic: Negative.   Neurological: Negative.   Hematological: Negative.   Psychiatric/Behavioral: Negative.    Vital signs are stable Physical Exam  Constitutional: He is oriented to person, place, and time. He appears well-developed and well-nourished.  HENT:  Head: Normocephalic and atraumatic.  Eyes: Pupils are equal, round, and reactive to light. Conjunctivae and EOM are normal.  Cardiovascular: Normal rate and regular rhythm.  Respiratory: Effort normal and breath sounds normal.  GI: Soft. Bowel sounds are normal.  Musculoskeletal:        General: Normal range of motion.     Cervical back: Normal range of motion and neck supple.     Comments: Patient severe scoliosis of his spine  Neurological: He is alert and oriented to person, place, and time.  Skin: Skin is warm and dry.  Psychiatric: He has a normal mood and affect. His behavior is normal. Judgment and thought content normal.    Assessment/Plan 1) Colorectal cancer screening-family history of colon cancer with personal history of tubular adenomas and diverticulosis, internal hemorrhoids/recent history of a perforated duodenal ulcer/GERD-proceed with EGD and colonoscopy at this time further  recommendation will be made after the procedures have been done. 2) Fatty liver/obesity. 3) Asthma/seasonal allergies. 4) Obstructive sleep apnea. 5) Diastolic CHF. 6) Osteoporosis.  Juanita Craver, MD 04/30/2019, 6:44 AM

## 2019-04-30 NOTE — Discharge Instructions (Signed)

## 2019-04-30 NOTE — Op Note (Signed)
[facility] Patient Name: Louis [name] Procedure Date: 04/30/2019 MRN: [id number] Attending MD: Juanita Craver , MD Date of Birth: Jul 25, 1955 CSN: [id number] Age: 64 Admit Type: Outpatient Procedure:                Colonoscopy with cold biopsies & hot snare                            polypectomy x 1. Indications:              Family history of colon cancer-father; CRC                            screening for colorectal malignant neoplasm;                            Personal history of tubular adenoma. Providers:                Juanita Craver, MD, Angus Seller, RN, Elspeth Cho                            Technician, Technician, Rosario Adie, CRNA Referring MD:             Lilia Argue. Pamella Pert, MD Medicines:                Monitored Anesthesia Care Complications:            No immediate complications. Estimated Blood Loss:     Estimated blood loss was minimal. Procedure:                Pre-Anesthesia Assessment: - Prior to the                            procedure, a history and physical was performed,                            and patient medications and allergies were                            reviewed. The patient's tolerance of previous                            anesthesia was also reviewed. The risks and                            benefits of the procedure and the sedation options                            and risks were discussed with the patient. All                            questions were answered, and informed consent was                            obtained. Prior Anticoagulants: The patient has  taken no previous anticoagulant or antiplatelet                            agents. ASA Grade Assessment: III - A patient with                            severe systemic disease. After reviewing the risks                            and benefits, the patient was deemed in                            satisfactory condition to undergo the  procedure. -                            Prior to the procedure, a History and Physical was                            performed, and patient medications and allergies                            were reviewed. The patient's tolerance of previous                            anesthesia was also reviewed. The risks and                            benefits of the procedure and the sedation options                            and risks were discussed with the patient. All                            questions were answered, and informed consent was                            obtained. Prior Anticoagulants: The patient has                            taken no previous anticoagulant or antiplatelet                            agents. ASA Grade Assessment: III - A patient with                            severe systemic disease. After reviewing the risks                            and benefits, the patient was deemed in                            satisfactory condition to undergo the procedure.  After obtaining informed consent, the colonoscope                            was passed under direct vision. Throughout the                            procedure, the patient's blood pressure, pulse, and                            oxygen saturations were monitored continuously. The                            CF-HQ190L ([id number]) Olympus colonoscope was                            introduced through the anus and advanced to the the                            cecum, identified by appendiceal orifice and                            ileocecal valve. The colonoscopy was performed with                            moderate difficulty due to inadequate bowel prep.                            Successful completion of the procedure was aided by                            lavage. The patient tolerated the procedure well.                            The quality of the bowel preparation was adequate.                             The ileocecal valve, the appendiceal orifice and                            the rectum were photographed. The bowel preparation                            used was SUPREP via split dose instruction. Scope In: 7:33:43 AM Scope Out: 7:49:56 AM Scope Withdrawal Time: 0 hours 6 minutes 3 seconds  Total Procedure Duration: 0 hours 16 minutes 13 seconds  Findings:      Multiple small and large-mouthed diverticula were found in the sigmoid       and descending colon.      A small sessile polyp was found in the rectum and one was noted in the       mid-descending colon-these were removed by cold biopsies.      A 9-10 mm sessile polyp was found in the mid-ascending colon and was       removed by hot snare polypectomy x 1-200/20;  resection and retrieval       were complete.      Moderate sized internal hemorrhoids were noted on retroflexion. Impression:               - Diverticulosis in the sigmoid and descending                            colon.                           - Two small sessile polyps, one in the rectum & one                            in the mid-descending colon-removed by cold                            biopsies.                           - One 9-10 mm sessile polyp in the mid-ascending                            colon-removed by hot snare x 1; resected and                            retrieved.                           - Moderate sized internal hemorrhoids.                           - The examination was otherwise normal. Moderate Sedation:      MAC used. Recommendation:           - High fiber, low fat diet with augmented water                            consumption daily.                           - Continue present medications; avoid the use of                            all NSAIDS.                           - Await pathology results.                           - Repeat colonoscopy in 5 years for surveillance.                           - Return to GI office  PRN.                           - If the patient has any abnormal GI symptoms in  the interim, he has been advised to call the office                            ASAP for further recommendations. Procedure Code(s):        --- Professional ---                           [id number], Colonoscopy, flexible; with removal of                            tumor(s), polyp(s), or other lesion(s) by snare                            technique Diagnosis Code(s):        --- Professional ---                           D12.3, Benign neoplasm of transverse colon (hepatic                            flexure or splenic flexure)                           D12.4, Benign neoplasm of descending colon                           Z80.0, Family history of malignant neoplasm of                            digestive organs                           Z12.11, Encounter for screening for malignant                            neoplasm of colon                           K57.30, Diverticulosis of large intestine without                            perforation or abscess without bleeding                           D12.8, Benign neoplasm of rectum                           Z86.010, Personal history of colonic polyps CPT copyright 2019 American Medical Association. All rights reserved. The codes documented in this report are preliminary and upon coder review may  be revised to meet current compliance requirements. Juanita Craver, MD Juanita Craver, MD 04/30/2019 8:20:13 AM This report has been signed electronically. Number of Addenda: 0

## 2019-04-30 NOTE — Anesthesia Postprocedure Evaluation (Signed)
Anesthesia Post Note  Patient: Louis [name]  Procedure(s) Performed: ESOPHAGOGASTRODUODENOSCOPY (EGD) WITH PROPOFOL (N/A ) COLONOSCOPY WITH PROPOFOL (N/A ) BIOPSY POLYPECTOMY     Patient location during evaluation: PACU Anesthesia Type: MAC Level of consciousness: awake and alert Pain management: pain level controlled Vital Signs Assessment: post-procedure vital signs reviewed and stable Respiratory status: spontaneous breathing, nonlabored ventilation and respiratory function stable Cardiovascular status: blood pressure returned to baseline and stable Postop Assessment: no apparent nausea or vomiting Anesthetic complications: no    Last Vitals:  Vitals:   04/30/19 0820 04/30/19 0821  BP: 128/71   Pulse: (!) 57 (!) 58  Resp: 20 15  Temp:    SpO2: 96% 95%    Last Pain:  Vitals:   04/30/19 0758  TempSrc: Oral  PainSc: 0-No pain                 Pervis Hocking

## 2019-05-01 ENCOUNTER — Encounter: Payer: Self-pay | Admitting: *Deleted

## 2019-05-01 ENCOUNTER — Other Ambulatory Visit: Payer: Self-pay

## 2019-05-01 LAB — SURGICAL PATHOLOGY

## 2019-06-16 ENCOUNTER — Ambulatory Visit (INDEPENDENT_AMBULATORY_CARE_PROVIDER_SITE_OTHER): Payer: 59 | Admitting: Physician Assistant

## 2019-06-16 ENCOUNTER — Encounter: Payer: Self-pay | Admitting: Physician Assistant

## 2019-06-16 ENCOUNTER — Ambulatory Visit (INDEPENDENT_AMBULATORY_CARE_PROVIDER_SITE_OTHER): Payer: 59

## 2019-06-16 VITALS — Ht 71.0 in | Wt 220.0 lb

## 2019-06-16 DIAGNOSIS — M79671 Pain in right foot: Secondary | ICD-10-CM

## 2019-06-16 NOTE — Progress Notes (Signed)
Office Visit Note   Patient: RAFAN SANDERS           Date of Birth: 08/20/1955           MRN: [id number] Visit Date: 06/16/2019              Requested by: Rutherford Guys, MD [address] [place],  [place] [address] PCP: Rutherford Guys, MD  Chief Complaint  Patient presents with  . Right Foot - Pain    DOI 06/14/19      HPI: This is a pleasant 64 year old gentleman with a chief complaint of right lateral foot pain.  He is almost 1 year status post ORIF of left foot fractures.  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  Pain is focused on the lateral side of his right foot  Assessment & Plan: Visit Diagnoses:  1. Pain in right foot     Plan: Patient will be weightbearing as tolerated in his cam walker boot.  He has done vitamin D supplementation before and will begin 2000 units daily.  He will also be sure that he has a healthy protein diet.  He will follow-up in 3 weeks at which time new x-ray should be obtained  Follow-Up Instructions: No follow-ups on file.   Ortho Exam  Patient is alert, oriented, no adenopathy, well-dressed, normal affect, normal respiratory effort. Right foot: Mild soft tissue swelling.  Skin is intact without any cellulitis.  Pulses are palpable.  He has no tenderness with manipulation of the midfoot no tenderness in the ankle or the medial foot.  He is focally tender over the base of the fifth metatarsal  Imaging: No results found. No images are attached to the encounter.  Labs: Lab Results  Component Value Date   REPTSTATUS 01/10/2019 FINAL 01/05/2019   CULT  01/05/2019    NO GROWTH 5 DAYS Performed at [facility], [address] [place], [place] [address]      Lab Results  Component Value Date   ALBUMIN 3.3 (L) 01/06/2019   ALBUMIN 4.3 01/05/2019   ALBUMIN 3.9 07/02/2017    Lab Results  Component Value Date   MG 1.9 01/07/2019   MG 2.2 01/06/2019   MG 1.2 (L)  01/06/2019   MG 1.3 (L) 01/06/2019   Lab Results  Component Value Date   VD25OH 35.12 09/26/2015   VD25OH 67.1 10/11/2014   VD25OH 67.1 06/16/2013    No results found for: PREALBUMIN CBC EXTENDED Latest Ref Rng & Units 01/09/2019 01/08/2019 01/07/2019  WBC 4.0 - 10.5 K/uL - 5.5 4.7  RBC 4.22 - 5.81 MIL/uL - 2.74(L) 2.84(L)  HGB 13.0 - 17.0 g/dL 8.9(L) 9.4(L) 10.0(L)  HCT 39 - 52 % 27.3(L) 28.5(L) 29.3(L)  PLT 150 - 400 K/uL - 195 193  NEUTROABS 1.7 - 7.7 K/uL - - -  LYMPHSABS 0.7 - 4.0 K/uL - - -     Body mass index is 30.68 kg/m.  Orders:  Orders Placed This Encounter  Procedures  . XR Foot 2 Views Right   No orders of the defined types were placed in this encounter.    Procedures: No procedures performed  Clinical Data: No additional findings.  ROS:  All other systems negative, except as noted in the HPI. Review of Systems  Objective: Vital Signs: Ht 5\' 11"  (1.803 m)   Wt 220 lb (99.8 kg)   BMI 30.68 kg/m  Specialty Comments:  No specialty comments available.  PMFS History: Patient Active Problem List   Diagnosis Date Noted  . Perforated viscus 01/06/2019  . Severe sepsis with septic shock ([place]) 01/06/2019  . Acute renal failure ([place]) 01/06/2019  . Hyperkalemia 01/06/2019  . Hyponatremia 01/06/2019  . Lactic acidosis 01/06/2019  . Onychomycosis 05/19/2018  . Foot fracture, left, closed, initial encounter 05/09/2018  . Lisfranc dislocation, left, initial encounter   . Acute bronchitis 02/25/2018  . Primary pulmonary hypertension ([place]) 07/09/2017  . Diastolic dysfunction 40/81/4481  . OSA (obstructive sleep apnea) 07/08/2017  . SOB (shortness of breath) 07/02/2017  . Chronic respiratory failure with hypoxia and hypercapnia ([place]) 07/02/2017  . Cardiomegaly 07/02/2017  . Exposure keratitis 02/17/2017  . Pedal edema 12/18/2016  . Transaminitis 12/18/2016  . Bilateral pes planus 12/18/2016  . Psoriasis-eczema overlap condition 07/30/2016  . High  serum high density lipoprotein (HDL) 09/26/2015  . Epistaxis 09/26/2015  . Renal artery stenosis in 1 of 2 vessels ([place])   . Essential tremor   . Elevated PSA   . GERD (gastroesophageal reflux disease)   . Health maintenance examination 10/08/2014  . Anxiety state 10/08/2014  . Obesity, Class I, BMI 30-34.9   . Mild intermittent asthma in adult without complication   . Seasonal allergies   . HTN (hypertension)   . Scoliosis   . Osteoporosis    Past Medical History:  Diagnosis Date  . Anxiety    situational  . BPH (benign prostatic hyperplasia)    on flomax  . Bundle branch block    per prior PCP records  . CHF (congestive heart failure) ([place])   . COPD (chronic obstructive pulmonary disease) ([place])   . Diverticulosis    by colonoscopy  . Eczema    per prior PCP records  . Elevated PSA 2015   peaked 6s, s/p benign biopsy 2014, sees urology [name] ([place])  . Essential tremor   . GERD (gastroesophageal reflux disease)    per prior PCP records  . History of panic attacks    per prior PCP records  . HTN (hypertension)   . Mild intermittent asthma in adult without complication   . Obesity, Class I, BMI 30-34.9   . Osteoporosis    DEXA 06/2013 with osteopenia - h/o ?wrist/hip fracture from AVN from chronic steroid use (asthma), took reclast for 1 year  . Renal artery stenosis in 1 of 2 vessels ([place]) 2011   by [place]  . Seasonal allergies   . Sleep apnea   . Thoracic scoliosis childhood  . Transaminitis    per prior PCP records    Family History  Problem Relation Age of Onset  . Cancer Father 19       colon  . Prostate cancer Father   . Diabetes Mother   . Alcohol abuse Mother   . Stroke Mother   . Cancer Maternal Grandmother        pancreatic  . Ataxia Brother   . Ataxia Sister   . CAD Neg Hx     Past Surgical History:  Procedure Laterality Date  . APPLICATION OF WOUND VAC Left 05/09/2018   Procedure: Application Of Wound Vac;  Surgeon: Newt Minion, MD;   Location: [place];  Service: Orthopedics;  Laterality: Left;  . BIOPSY  04/30/2019   Procedure: BIOPSY;  Surgeon: Juanita Craver, MD;  Location: [facility];  Service: Endoscopy;;  . COLONOSCOPY  12/2013   polyps, int hem, diverticulosis, rpt 5 yrs ([place])  .  COLONOSCOPY WITH PROPOFOL N/A 04/30/2019   Procedure: COLONOSCOPY WITH PROPOFOL;  Surgeon: Juanita Craver, MD;  Location: [facility];  Service: Endoscopy;  Laterality: N/A;  . ELBOW SURGERY Right 2008   golfer's elbow  . ESOPHAGOGASTRODUODENOSCOPY (EGD) WITH PROPOFOL N/A 04/30/2019   Procedure: ESOPHAGOGASTRODUODENOSCOPY (EGD) WITH PROPOFOL;  Surgeon: Juanita Craver, MD;  Location[facility];  Service: Endoscopy;  Laterality: N/A;  . INGUINAL HERNIA REPAIR Bilateral childhood & ~1995  . LAPAROTOMY N/A 01/05/2019   Procedure: EXPLORATORY LAPAROTOMY graham patch repair;  Surgeon: Benjamine Sprague, DO;  Location: [facility];  Service: General;  Laterality: N/A;  . NASAL SEPTUM SURGERY  2013   deviated septum  . ORIF ANKLE FRACTURE Left 05/09/2018   Procedure: OPEN REDUCTION INTERNAL FIXATION LEFT LISFRANC FRACTURE/DISLOCATION;  Surgeon: Newt Minion, MD;  Location: [place];  Service: Orthopedics;  Laterality: Left;  . POLYPECTOMY  04/30/2019   Procedure: POLYPECTOMY;  Surgeon: Juanita Craver, MD;  Location[facility];  Service: Endoscopy;;  . US ECHOCARDIOGRAPHY  02/2009   WNL, EF >55% Claiborne [place])  . US RENAL/AORTA Right 05/2009   1-59% diameter reduction renal artery, rec rpt 2 yrs   Social History   Occupational History  . Not on file  Tobacco Use  . Smoking status: Never Smoker  . Smokeless tobacco: Never Used  Vaping Use  . Vaping Use: Never used  Substance and Sexual Activity  . Alcohol use: Yes    Alcohol/week: 14.0 standard drinks    Types: 14 Glasses of wine per week    Comment: Social  . Drug use: No  . Sexual activity: Yes    Birth control/protection: None

## 2019-06-25 ENCOUNTER — Inpatient Hospital Stay (HOSPITAL_COMMUNITY)
Admit: 2019-06-25 | Discharge: 2019-07-07 | DRG: 871 | Disposition: A | Discharge status: Rehabilitation Facility | Payer: 59 | Attending: Internal Medicine | Admitting: Internal Medicine

## 2019-06-25 ENCOUNTER — Emergency Department (HOSPITAL_COMMUNITY): Payer: 59

## 2019-06-25 ENCOUNTER — Encounter (HOSPITAL_COMMUNITY): Payer: Self-pay | Admitting: Emergency Medicine

## 2019-06-25 ENCOUNTER — Inpatient Hospital Stay (HOSPITAL_COMMUNITY): Payer: 59

## 2019-06-25 ENCOUNTER — Other Ambulatory Visit: Payer: Self-pay

## 2019-06-25 DIAGNOSIS — R270 Ataxia, unspecified: Secondary | ICD-10-CM | POA: Diagnosis present

## 2019-06-25 DIAGNOSIS — K5790 Diverticulosis of intestine, part unspecified, without perforation or abscess without bleeding: Secondary | ICD-10-CM | POA: Diagnosis present

## 2019-06-25 DIAGNOSIS — T148XXA Other injury of unspecified body region, initial encounter: Secondary | ICD-10-CM

## 2019-06-25 DIAGNOSIS — K219 Gastro-esophageal reflux disease without esophagitis: Secondary | ICD-10-CM | POA: Diagnosis present

## 2019-06-25 DIAGNOSIS — I700 Atherosclerosis of aorta: Secondary | ICD-10-CM | POA: Diagnosis present

## 2019-06-25 DIAGNOSIS — R296 Repeated falls: Secondary | ICD-10-CM | POA: Diagnosis present

## 2019-06-25 DIAGNOSIS — J9611 Chronic respiratory failure with hypoxia: Secondary | ICD-10-CM | POA: Diagnosis present

## 2019-06-25 DIAGNOSIS — R509 Fever, unspecified: Secondary | ICD-10-CM

## 2019-06-25 DIAGNOSIS — F411 Generalized anxiety disorder: Secondary | ICD-10-CM | POA: Diagnosis not present

## 2019-06-25 DIAGNOSIS — R159 Full incontinence of feces: Secondary | ICD-10-CM | POA: Diagnosis present

## 2019-06-25 DIAGNOSIS — I5032 Chronic diastolic (congestive) heart failure: Secondary | ICD-10-CM | POA: Diagnosis present

## 2019-06-25 DIAGNOSIS — G629 Polyneuropathy, unspecified: Secondary | ICD-10-CM | POA: Diagnosis present

## 2019-06-25 DIAGNOSIS — R7401 Elevation of levels of liver transaminase levels: Secondary | ICD-10-CM | POA: Diagnosis present

## 2019-06-25 DIAGNOSIS — E860 Dehydration: Secondary | ICD-10-CM | POA: Diagnosis present

## 2019-06-25 DIAGNOSIS — E871 Hypo-osmolality and hyponatremia: Secondary | ICD-10-CM | POA: Diagnosis present

## 2019-06-25 DIAGNOSIS — J9621 Acute and chronic respiratory failure with hypoxia: Secondary | ICD-10-CM | POA: Diagnosis not present

## 2019-06-25 DIAGNOSIS — E441 Mild protein-calorie malnutrition: Secondary | ICD-10-CM | POA: Diagnosis present

## 2019-06-25 DIAGNOSIS — D539 Nutritional anemia, unspecified: Secondary | ICD-10-CM | POA: Diagnosis not present

## 2019-06-25 DIAGNOSIS — Z978 Presence of other specified devices: Secondary | ICD-10-CM

## 2019-06-25 DIAGNOSIS — Z811 Family history of alcohol abuse and dependence: Secondary | ICD-10-CM

## 2019-06-25 DIAGNOSIS — F1010 Alcohol abuse, uncomplicated: Secondary | ICD-10-CM | POA: Diagnosis present

## 2019-06-25 DIAGNOSIS — J9622 Acute and chronic respiratory failure with hypercapnia: Secondary | ICD-10-CM | POA: Diagnosis not present

## 2019-06-25 DIAGNOSIS — Z6829 Body mass index (BMI) 29.0-29.9, adult: Secondary | ICD-10-CM

## 2019-06-25 DIAGNOSIS — M419 Scoliosis, unspecified: Secondary | ICD-10-CM | POA: Diagnosis present

## 2019-06-25 DIAGNOSIS — E876 Hypokalemia: Secondary | ICD-10-CM | POA: Diagnosis not present

## 2019-06-25 DIAGNOSIS — Z20822 Contact with and (suspected) exposure to covid-19: Secondary | ICD-10-CM | POA: Diagnosis present

## 2019-06-25 DIAGNOSIS — J189 Pneumonia, unspecified organism: Secondary | ICD-10-CM | POA: Diagnosis present

## 2019-06-25 DIAGNOSIS — I10 Essential (primary) hypertension: Secondary | ICD-10-CM | POA: Diagnosis present

## 2019-06-25 DIAGNOSIS — J9601 Acute respiratory failure with hypoxia: Secondary | ICD-10-CM | POA: Diagnosis not present

## 2019-06-25 DIAGNOSIS — R0602 Shortness of breath: Secondary | ICD-10-CM

## 2019-06-25 DIAGNOSIS — Y95 Nosocomial condition: Secondary | ICD-10-CM | POA: Diagnosis present

## 2019-06-25 DIAGNOSIS — I340 Nonrheumatic mitral (valve) insufficiency: Secondary | ICD-10-CM | POA: Diagnosis not present

## 2019-06-25 DIAGNOSIS — N170 Acute kidney failure with tubular necrosis: Secondary | ICD-10-CM | POA: Diagnosis not present

## 2019-06-25 DIAGNOSIS — N390 Urinary tract infection, site not specified: Secondary | ICD-10-CM | POA: Diagnosis present

## 2019-06-25 DIAGNOSIS — E669 Obesity, unspecified: Secondary | ICD-10-CM | POA: Diagnosis present

## 2019-06-25 DIAGNOSIS — J440 Chronic obstructive pulmonary disease with acute lower respiratory infection: Secondary | ICD-10-CM | POA: Diagnosis present

## 2019-06-25 DIAGNOSIS — Z79899 Other long term (current) drug therapy: Secondary | ICD-10-CM

## 2019-06-25 DIAGNOSIS — N182 Chronic kidney disease, stage 2 (mild): Secondary | ICD-10-CM | POA: Diagnosis present

## 2019-06-25 DIAGNOSIS — I130 Hypertensive heart and chronic kidney disease with heart failure and stage 1 through stage 4 chronic kidney disease, or unspecified chronic kidney disease: Secondary | ICD-10-CM | POA: Diagnosis present

## 2019-06-25 DIAGNOSIS — J984 Other disorders of lung: Secondary | ICD-10-CM | POA: Diagnosis present

## 2019-06-25 DIAGNOSIS — I701 Atherosclerosis of renal artery: Secondary | ICD-10-CM | POA: Diagnosis not present

## 2019-06-25 DIAGNOSIS — K760 Fatty (change of) liver, not elsewhere classified: Secondary | ICD-10-CM | POA: Diagnosis present

## 2019-06-25 DIAGNOSIS — G4733 Obstructive sleep apnea (adult) (pediatric): Secondary | ICD-10-CM | POA: Diagnosis present

## 2019-06-25 DIAGNOSIS — N179 Acute kidney failure, unspecified: Secondary | ICD-10-CM | POA: Diagnosis present

## 2019-06-25 DIAGNOSIS — Z01818 Encounter for other preprocedural examination: Secondary | ICD-10-CM

## 2019-06-25 DIAGNOSIS — R7881 Bacteremia: Secondary | ICD-10-CM

## 2019-06-25 DIAGNOSIS — B952 Enterococcus as the cause of diseases classified elsewhere: Secondary | ICD-10-CM | POA: Diagnosis not present

## 2019-06-25 DIAGNOSIS — E875 Hyperkalemia: Secondary | ICD-10-CM

## 2019-06-25 DIAGNOSIS — R531 Weakness: Secondary | ICD-10-CM | POA: Diagnosis not present

## 2019-06-25 DIAGNOSIS — A4181 Sepsis due to Enterococcus: Principal | ICD-10-CM | POA: Diagnosis present

## 2019-06-25 DIAGNOSIS — G250 Essential tremor: Secondary | ICD-10-CM | POA: Diagnosis present

## 2019-06-25 DIAGNOSIS — R0603 Acute respiratory distress: Secondary | ICD-10-CM | POA: Diagnosis not present

## 2019-06-25 DIAGNOSIS — N400 Enlarged prostate without lower urinary tract symptoms: Secondary | ICD-10-CM | POA: Diagnosis present

## 2019-06-25 DIAGNOSIS — E538 Deficiency of other specified B group vitamins: Secondary | ICD-10-CM | POA: Diagnosis present

## 2019-06-25 DIAGNOSIS — J9612 Chronic respiratory failure with hypercapnia: Secondary | ICD-10-CM | POA: Diagnosis not present

## 2019-06-25 DIAGNOSIS — Z888 Allergy status to other drugs, medicaments and biological substances status: Secondary | ICD-10-CM

## 2019-06-25 DIAGNOSIS — R5381 Other malaise: Secondary | ICD-10-CM | POA: Diagnosis not present

## 2019-06-25 DIAGNOSIS — M810 Age-related osteoporosis without current pathological fracture: Secondary | ICD-10-CM | POA: Diagnosis present

## 2019-06-25 DIAGNOSIS — D7589 Other specified diseases of blood and blood-forming organs: Secondary | ICD-10-CM | POA: Diagnosis present

## 2019-06-25 DIAGNOSIS — J9809 Other diseases of bronchus, not elsewhere classified: Secondary | ICD-10-CM | POA: Diagnosis present

## 2019-06-25 DIAGNOSIS — I77810 Thoracic aortic ectasia: Secondary | ICD-10-CM | POA: Diagnosis present

## 2019-06-25 DIAGNOSIS — J9811 Atelectasis: Secondary | ICD-10-CM

## 2019-06-25 DIAGNOSIS — D696 Thrombocytopenia, unspecified: Secondary | ICD-10-CM | POA: Diagnosis present

## 2019-06-25 LAB — URINALYSIS, ROUTINE W REFLEX MICROSCOPIC
Bilirubin Urine: NEGATIVE
Glucose, UA: >=500 mg/dL — AB
Ketones, ur: 5 mg/dL — AB
Leukocytes,Ua: NEGATIVE
Nitrite: NEGATIVE
Protein, ur: NEGATIVE mg/dL
Specific Gravity, Urine: 1.011 (ref 1.005–1.030)
pH: 5 (ref 5.0–8.0)

## 2019-06-25 LAB — CBC WITH DIFFERENTIAL/PLATELET
Abs Immature Granulocytes: 0.05 10*3/uL (ref 0.00–0.07)
Basophils Absolute: 0 10*3/uL (ref 0.0–0.1)
Basophils Relative: 0 %
Eosinophils Absolute: 0 10*3/uL (ref 0.0–0.5)
Eosinophils Relative: 0 %
HCT: 30 % — ABNORMAL LOW (ref 39.0–52.0)
Hemoglobin: 10.4 g/dL — ABNORMAL LOW (ref 13.0–17.0)
Immature Granulocytes: 1 %
Lymphocytes Relative: 3 %
Lymphs Abs: 0.2 10*3/uL — ABNORMAL LOW (ref 0.7–4.0)
MCH: 34.6 pg — ABNORMAL HIGH (ref 26.0–34.0)
MCHC: 34.7 g/dL (ref 30.0–36.0)
MCV: 99.7 fL (ref 80.0–100.0)
Monocytes Absolute: 0.4 10*3/uL (ref 0.1–1.0)
Monocytes Relative: 6 %
Neutro Abs: 5.5 10*3/uL (ref 1.7–7.7)
Neutrophils Relative %: 90 %
Platelets: 133 10*3/uL — ABNORMAL LOW (ref 150–400)
RBC: 3.01 MIL/uL — ABNORMAL LOW (ref 4.22–5.81)
RDW: 12.9 % (ref 11.5–15.5)
WBC: 6.1 10*3/uL (ref 4.0–10.5)
nRBC: 0 % (ref 0.0–0.2)

## 2019-06-25 LAB — IRON AND TIBC
Iron: 30 ug/dL — ABNORMAL LOW (ref 45–182)
Saturation Ratios: 14 % — ABNORMAL LOW (ref 17.9–39.5)
TIBC: 209 ug/dL — ABNORMAL LOW (ref 250–450)
UIBC: 179 ug/dL

## 2019-06-25 LAB — D-DIMER, QUANTITATIVE: D-Dimer, Quant: 1.9 ug/mL-FEU — ABNORMAL HIGH (ref 0.00–0.50)

## 2019-06-25 LAB — BRAIN NATRIURETIC PEPTIDE: B Natriuretic Peptide: 229.1 pg/mL — ABNORMAL HIGH (ref 0.0–100.0)

## 2019-06-25 LAB — FERRITIN: Ferritin: 2333 ng/mL — ABNORMAL HIGH (ref 24–336)

## 2019-06-25 LAB — AMMONIA: Ammonia: 25 umol/L (ref 9–35)

## 2019-06-25 LAB — COMPREHENSIVE METABOLIC PANEL
ALT: 57 U/L — ABNORMAL HIGH (ref 0–44)
AST: 68 U/L — ABNORMAL HIGH (ref 15–41)
Albumin: 4 g/dL (ref 3.5–5.0)
Alkaline Phosphatase: 90 U/L (ref 38–126)
Anion gap: 11 (ref 5–15)
BUN: 33 mg/dL — ABNORMAL HIGH (ref 8–23)
CO2: 27 mmol/L (ref 22–32)
Calcium: 9.1 mg/dL (ref 8.9–10.3)
Chloride: 87 mmol/L — ABNORMAL LOW (ref 98–111)
Creatinine, Ser: 1.99 mg/dL — ABNORMAL HIGH (ref 0.61–1.24)
GFR calc Af Amer: 41 mL/min — ABNORMAL LOW (ref >60)
GFR calc non Af Amer: 35 mL/min — ABNORMAL LOW (ref >60)
Glucose, Bld: 130 mg/dL — ABNORMAL HIGH (ref 70–99)
Potassium: 5.8 mmol/L — ABNORMAL HIGH (ref 3.5–5.1)
Sodium: 125 mmol/L — ABNORMAL LOW (ref 135–145)
Total Bilirubin: 0.8 mg/dL (ref 0.3–1.2)
Total Protein: 7.7 g/dL (ref 6.5–8.1)

## 2019-06-25 LAB — SARS CORONAVIRUS 2 BY RT PCR (HOSPITAL ORDER, PERFORMED IN ~~LOC~~ HOSPITAL LAB): SARS Coronavirus 2: NEGATIVE

## 2019-06-25 LAB — PROTEIN / CREATININE RATIO, URINE
Creatinine, Urine: 117.24 mg/dL
Protein Creatinine Ratio: 0.42 mg/mg{Cre} — ABNORMAL HIGH (ref 0.00–0.15)
Total Protein, Urine: 49 mg/dL

## 2019-06-25 LAB — FOLATE: Folate: 8.4 ng/mL (ref >5.9)

## 2019-06-25 LAB — RETICULOCYTES
Immature Retic Fract: 12.4 % (ref 2.3–15.9)
RBC.: 2.77 MIL/uL — ABNORMAL LOW (ref 4.22–5.81)
Retic Count, Absolute: 37.7 10*3/uL (ref 19.0–186.0)
Retic Ct Pct: 1.4 % (ref 0.4–3.1)

## 2019-06-25 LAB — SODIUM, URINE, RANDOM: Sodium, Ur: 30 mmol/L

## 2019-06-25 LAB — OSMOLALITY, URINE: Osmolality, Ur: 448 mOsm/kg (ref 300–900)

## 2019-06-25 LAB — CK: Total CK: 344 U/L (ref 49–397)

## 2019-06-25 LAB — LACTIC ACID, PLASMA: Lactic Acid, Venous: 1.8 mmol/L (ref 0.5–1.9)

## 2019-06-25 LAB — VITAMIN B12: Vitamin B-12: 237 pg/mL (ref 180–914)

## 2019-06-25 LAB — CBG MONITORING, ED: Glucose-Capillary: 140 mg/dL — ABNORMAL HIGH (ref 70–99)

## 2019-06-25 LAB — TROPONIN I (HIGH SENSITIVITY): Troponin I (High Sensitivity): 11 ng/L (ref <18)

## 2019-06-25 MED ORDER — SODIUM CHLORIDE 0.9 % IV BOLUS
500.0000 mL | Freq: Once | INTRAVENOUS | Status: AC
  Administered 2019-06-25: 500 mL via INTRAVENOUS

## 2019-06-25 MED ORDER — TRAMADOL HCL 50 MG PO TABS
50.0000 mg | ORAL_TABLET | Freq: Two times a day (BID) | ORAL | Status: DC | PRN
  Administered 2019-06-27 – 2019-07-03 (×4): 50 mg via ORAL
  Filled 2019-06-25 (×4): qty 1

## 2019-06-25 MED ORDER — THIAMINE HCL 100 MG PO TABS
100.0000 mg | ORAL_TABLET | Freq: Every day | ORAL | Status: DC
  Administered 2019-06-25 – 2019-06-29 (×5): 100 mg via ORAL
  Filled 2019-06-25 (×5): qty 1

## 2019-06-25 MED ORDER — ADULT MULTIVITAMIN W/MINERALS CH
1.0000 | ORAL_TABLET | Freq: Every day | ORAL | Status: DC
  Administered 2019-06-26 – 2019-07-07 (×12): 1 via ORAL
  Filled 2019-06-25 (×12): qty 1

## 2019-06-25 MED ORDER — ACETAMINOPHEN 325 MG PO TABS
650.0000 mg | ORAL_TABLET | Freq: Once | ORAL | Status: AC
  Administered 2019-06-25: 650 mg via ORAL
  Filled 2019-06-25: qty 2

## 2019-06-25 MED ORDER — SODIUM ZIRCONIUM CYCLOSILICATE 10 G PO PACK
10.0000 g | PACK | Freq: Once | ORAL | Status: AC
  Administered 2019-06-25: 10 g via ORAL
  Filled 2019-06-25: qty 1

## 2019-06-25 MED ORDER — AMLODIPINE BESYLATE 5 MG PO TABS
5.0000 mg | ORAL_TABLET | Freq: Every day | ORAL | Status: DC
  Administered 2019-06-26 – 2019-06-30 (×5): 5 mg via ORAL
  Filled 2019-06-25 (×6): qty 1

## 2019-06-25 MED ORDER — SODIUM CHLORIDE 0.9 % IV SOLN
1.0000 g | Freq: Once | INTRAVENOUS | Status: AC
  Administered 2019-06-25: 1 g via INTRAVENOUS
  Filled 2019-06-25: qty 10

## 2019-06-25 MED ORDER — GABAPENTIN 300 MG PO CAPS
300.0000 mg | ORAL_CAPSULE | Freq: Two times a day (BID) | ORAL | Status: DC
  Administered 2019-06-26 – 2019-07-07 (×22): 300 mg via ORAL
  Filled 2019-06-25 (×23): qty 1

## 2019-06-25 MED ORDER — ONDANSETRON HCL 4 MG/2ML IJ SOLN: 4.0000 mg | Freq: Four times a day (QID) | INTRAMUSCULAR | Status: DC | PRN

## 2019-06-25 MED ORDER — CALCIUM GLUCONATE-NACL 1-0.675 GM/50ML-% IV SOLN
1.0000 g | Freq: Once | INTRAVENOUS | Status: AC
  Administered 2019-06-25: 1000 mg via INTRAVENOUS
  Filled 2019-06-25: qty 50

## 2019-06-25 MED ORDER — FOLIC ACID 1 MG PO TABS
1.0000 mg | ORAL_TABLET | Freq: Every day | ORAL | Status: DC
  Administered 2019-06-26 – 2019-07-07 (×12): 1 mg via ORAL
  Filled 2019-06-25 (×12): qty 1

## 2019-06-25 MED ORDER — IOHEXOL 350 MG/ML SOLN
75.0000 mL | Freq: Once | INTRAVENOUS | Status: AC | PRN
  Administered 2019-06-25: 75 mL via INTRAVENOUS

## 2019-06-25 MED ORDER — METOPROLOL SUCCINATE ER 25 MG PO TB24
150.0000 mg | ORAL_TABLET | Freq: Every day | ORAL | Status: DC
  Administered 2019-06-26 – 2019-06-30 (×5): 150 mg via ORAL
  Filled 2019-06-25 (×5): qty 1

## 2019-06-25 MED ORDER — ALPRAZOLAM 0.5 MG PO TABS
0.5000 mg | ORAL_TABLET | Freq: Every day | ORAL | Status: DC | PRN
  Administered 2019-06-27 – 2019-06-30 (×2): 0.5 mg via ORAL
  Filled 2019-06-25 (×2): qty 1

## 2019-06-25 MED ORDER — SODIUM CHLORIDE (PF) 0.9 % IJ SOLN
INTRAMUSCULAR | Status: AC
  Filled 2019-06-25: qty 50

## 2019-06-25 MED ORDER — SODIUM CHLORIDE 0.9 % IV SOLN: INTRAVENOUS | Status: DC

## 2019-06-25 MED ORDER — ONDANSETRON HCL 4 MG PO TABS: 4.0000 mg | ORAL_TABLET | Freq: Four times a day (QID) | ORAL | Status: DC | PRN

## 2019-06-25 MED ORDER — HEPARIN SODIUM (PORCINE) 5000 UNIT/ML IJ SOLN
5000.0000 [IU] | Freq: Three times a day (TID) | INTRAMUSCULAR | Status: DC
  Administered 2019-06-26 – 2019-07-07 (×35): 5000 [IU] via SUBCUTANEOUS
  Filled 2019-06-25 (×34): qty 1

## 2019-06-25 NOTE — H&P (Signed)
History and Physical   Louis [name] EQA:[id number] DOB: [birth year] DOA: 06/25/2019  Referring MD/NP/PA: Dr. Ralene Bathe, EDP PCP: Rutherford Guys, MD Outpatient Specialists: GI, Dr. [name]; Cardiology, Dr. Georgina Peer; Pulmonology, [name]  Patient coming from: Home  Chief Complaint: Falls, weakness, progressive tremor  HPI: Louis [name] is a 64 y.o. male with a history of scoliosis with restrictive and obstructive lung disease, chronic HFpEF, OSA and nocturnal hypoxia on CPAP and 2L O2, essential tremor, hepatic steatosis, and perforated duodenal ulcer s/p [name] Jan 2021 who presented to the ED with increasing falls and progressive weakness. He describes several falls at home over the previous several weeks though had abrupt increase in fall frequency about a week ago and this time was unable to get up from a chair after family helped him up. He does not feel unwell, denies subjective fever or chills, but was febrile on arrival to the ED (101.63F rectal) and reported shortness of breath and had tachypnea (23/min). Work up included CXR with indistinct hazy left-sided opacity and subsequent CTA chest after d-dimer was 1.90 which had motion degradation without PE or focal consolidation. There were incidental findings including renal lesions discussed below. CT head was without acute abnormality and cervical spine CT showed no severe spinal stenosis. There were cervical vertebral nonexpansile lesions, also discussed below. Multiple metabolic derangements including hyponatremia (125), hyperkalemic (5.8) acute renal failure (SCr 1.99 from baseline of 1), and mild LFT elevations. BNP 229.1 and troponin negative. SARS-CoV-2 negative, lactic acid wnl, WBC 6.1k, hgb 10.4g/dl, and platelets 133. Urine micro showed many bacteria and hyaline casts without RBCs or WBCs. Ceftriaxone and 500cc NS given, blood and urine cultures sent, neurology consulted, and hospitalists consulted for admission.  ROS: Denies recent sick  contacts, was fully vaccinated for covid-19 in February. Denies night sweats, weight loss, poor appetite, changes in vision or hearing, headache, cough, sore throat, chest pain, palpitations, abdominal pain. Does have intermittent nausea, none currently, he's hungry. No vomiting. Has about 2 loose stools per day for chronically without recent change and without blood in stool, change in bladder habits, myalgias, arthralgias, or rash. All others reviewed and are negative.   Past Medical History:  Diagnosis Date  . Anxiety    situational  . BPH (benign prostatic hyperplasia)    on flomax  . Bundle branch block    per prior PCP records  . CHF (congestive heart failure) ([place])   . COPD (chronic obstructive pulmonary disease) ([place])   . Diverticulosis    by colonoscopy  . Eczema    per prior PCP records  . Elevated PSA 2015   peaked 6s, s/p benign biopsy 2014, sees urology [name] ([place])  . Essential tremor   . GERD (gastroesophageal reflux disease)    per prior PCP records  . History of panic attacks    per prior PCP records  . HTN (hypertension)   . Mild intermittent asthma in adult without complication   . Obesity, Class I, BMI 30-34.9   . Osteoporosis    DEXA 06/2013 with osteopenia - h/o ?wrist/hip fracture from AVN from chronic steroid use (asthma), took reclast for 1 year  . Renal artery stenosis in 1 of 2 vessels ([place]) 2011   by [place]  . Seasonal allergies   . Sleep apnea   . Thoracic scoliosis childhood  . Transaminitis    per prior PCP records   Past Surgical History:  Procedure Laterality Date  . APPLICATION OF WOUND VAC Left 05/09/2018  Procedure: Application Of Wound Vac;  Surgeon: Newt Minion, MD;  Location: [facility];  Service: Orthopedics;  Laterality: Left;  . BIOPSY  04/30/2019   Procedure: BIOPSY;  Surgeon: Juanita Craver, MD;  Location: [facility];  Service: Endoscopy;;  . COLONOSCOPY  12/2013   polyps, int hem, diverticulosis, rpt 5 yrs ([place])  . COLONOSCOPY  WITH PROPOFOL N/A 04/30/2019   Procedure: COLONOSCOPY WITH PROPOFOL;  Surgeon: Juanita Craver, MD;  Location: [facility];  Service: Endoscopy;  Laterality: N/A;  . ELBOW SURGERY Right 2008   golfer's elbow  . ESOPHAGOGASTRODUODENOSCOPY (EGD) WITH PROPOFOL N/A 04/30/2019   Procedure: ESOPHAGOGASTRODUODENOSCOPY (EGD) WITH PROPOFOL;  Surgeon: Juanita Craver, MD;  [facility];  Service: Endoscopy;  Laterality: N/A;  . INGUINAL HERNIA REPAIR Bilateral childhood & ~1995  . LAPAROTOMY N/A 01/05/2019   Procedure: EXPLORATORY LAPAROTOMY graham patch repair;  Surgeon: Benjamine Sprague, DO;  Location: [facility];  Service: General;  Laterality: N/A;  . NASAL SEPTUM SURGERY  2013   deviated septum  . ORIF ANKLE FRACTURE Left 05/09/2018   Procedure: OPEN REDUCTION INTERNAL FIXATION LEFT LISFRANC FRACTURE/DISLOCATION;  Surgeon: Newt Minion, MD;  Location: [facility];  Service: Orthopedics;  Laterality: Left;  . POLYPECTOMY  04/30/2019   Procedure: POLYPECTOMY;  Surgeon: Juanita Craver, MD;  Location[facility];  Service: Endoscopy;;  . US ECHOCARDIOGRAPHY  02/2009   WNL, EF >55% [facility])  . US RENAL/AORTA Right 05/2009   1-59% diameter reduction renal artery, rec rpt 2 yrs   - Very minimal tobacco use in adolescence. Drinks on average 2 glasses of wine per day, up to 3-4 generous glasses some days. Has never gone through withdrawal. Married, lives with wife, has grown child/children.   Allergies  Allergen Reactions  . Accupril [Quinapril Hcl] Cough  . Nsaids     Perforated gastric ulcer   Family History  Problem Relation Age of Onset  . Cancer Father 69       colon  . Prostate cancer Father   . Diabetes Mother   . Alcohol abuse Mother   . Stroke Mother   . Cancer Maternal Grandmother        pancreatic  . Ataxia Brother   . Ataxia Sister   . CAD Neg Hx    - Family history otherwise reviewed and not pertinent.  Prior to Admission medications   Medication Sig Start Date End Date Taking?  Authorizing Provider  ALPRAZolam Duanne Moron) 0.5 MG tablet Take 1 tablet (0.5 mg total) by mouth daily as needed (essential tremor). 11/18/18  Yes Rutherford Guys, MD  amLODipine (NORVASC) 5 MG tablet TAKE 1 TABLET(5 MG) BY MOUTH DAILY Patient taking differently: Take 5 mg by mouth daily.  12/22/18  Yes Troy Sine, MD  gabapentin (NEURONTIN) 300 MG capsule Take 300 mg by mouth 2 (two) times daily.   Yes [provider]  losartan (COZAAR) 100 MG tablet Take 1 tablet (100 mg total) by mouth daily. NEED OV. 01/26/19  Yes Almyra Deforest, PA  metoprolol succinate (TOPROL-XL) 50 MG 24 hr tablet Take 3 tablets (150 mg total) by mouth daily. Take with or immediately following a meal. 01/26/19  Yes Meng, [place], [place]  spironolactone (ALDACTONE) 25 MG tablet TAKE 1 TABLET BY MOUTH EVERY DAY Patient taking differently: Take 25 mg by mouth daily.  10/14/18  Yes Troy Sine, MD  pantoprazole (PROTONIX) 40 MG tablet Take 1 tablet (40 mg total) by mouth daily. Patient not taking: Reported on 06/25/2019 01/09/19 01/09/20  Benjamine Sprague, DO    Physical Exam: Vitals:   06/25/19 1547 06/25/19 1655 06/25/19 1700 06/25/19 1730  BP: 133/79 120/73 (!) 120/95 (!) 137/96  Pulse: 72 88 90 89  Resp: 18 (!) 24 (!) 33 (!) 32  Temp:      TempSrc:      SpO2: 96% 97% 97% 96%   Constitutional: 64 y.o. male in no distress, calm demeanor Eyes: Lids and conjunctivae normal, PERRL ENMT: Mucous membranes are tacky. Posterior pharynx clear of any exudate or lesions. Fair dentition.  Neck: Normal, supple, no masses, no thyromegaly Respiratory: Non-labored breathing but tachypneic on room air with mask on without accessory muscle use. Clear breath sounds to auscultation bilaterally Cardiovascular: Regular rate and rhythm, no murmurs, rubs, or gallops. No carotid bruits. No HJR/JVD. No significant LE edema. 2+ pedal pulses. Abdomen: Normoactive bowel sounds, protuberant with midline surgical scar healing without exudate or  tenderness or fluctuance. No tenderness, non-distended, and no masses palpated. No hepatosplenomegaly. GU: No indwelling catheter Musculoskeletal: No clubbing / cyanosis. Bilateral pes planus. Left foot with stable deformity s/p plate/ORIF. No contractures. Normal muscle tone.  Skin: Warm, dry. Dry feet/toes. Abrasion on left knee and right dorsal forearm. No exudative lesions noted.  Neurologic: CN II-XII grossly intact with nonacute mild right ptosis. Diffuse tremor worse with intention somewhat at rest, slow and ataxic finger-to-nose bilaterally. +LE clonus. Sensation diminished below knees bilaterally. Speech normal. No other focal deficits in motor strength or sensation in all extremities. Psychiatric: Alert and oriented x3. Normal judgment and insight. Mood euthymic, slightly anxious with congruent affect.   Labs on Admission: I have personally reviewed following labs and imaging studies  CBC: Recent Labs  Lab 06/25/19 1144  WBC 6.1  NEUTROABS 5.5  HGB 10.4*  HCT 30.0*  MCV 99.7  PLT 500*   Basic Metabolic Panel: Recent Labs  Lab 06/25/19 1335  NA 125*  K 5.8*  CL 87*  CO2 27  GLUCOSE 130*  BUN 33*  CREATININE 1.99*  CALCIUM 9.1   GFR: Estimated Creatinine Clearance: 46.9 mL/min (A) (by C-G formula based on SCr of 1.99 mg/dL (H)). Liver Function Tests: Recent Labs  Lab 06/25/19 1335  AST 68*  ALT 57*  ALKPHOS 90  BILITOT 0.8  PROT 7.7  ALBUMIN 4.0   No results for input(s): LIPASE, AMYLASE in the last 168 hours. No results for input(s): AMMONIA in the last 168 hours. Coagulation Profile: No results for input(s): INR, PROTIME in the last 168 hours. Cardiac Enzymes: Recent Labs  Lab 06/25/19 1335  CKTOTAL 344   BNP (last 3 results) No results for input(s): PROBNP in the last 8760 hours. HbA1C: No results for input(s): HGBA1C in the last 72 hours. CBG: Recent Labs  Lab 06/25/19 1226  GLUCAP 140*   Lipid Profile: No results for input(s): CHOL, HDL,  LDLCALC, TRIG, CHOLHDL, LDLDIRECT in the last 72 hours. Thyroid Function Tests: No results for input(s): TSH, T4TOTAL, FREET4, T3FREE, THYROIDAB in the last 72 hours. Anemia Panel: No results for input(s): VITAMINB12, FOLATE, FERRITIN, TIBC, IRON, RETICCTPCT in the last 72 hours. Urine analysis:    Component Value Date/Time   COLORURINE YELLOW 06/25/2019 1144   APPEARANCEUR CLEAR 06/25/2019 1144   APPEARANCEUR Clear 02/27/2017 1555   LABSPEC 1.011 06/25/2019 1144   PHURINE 5.0 06/25/2019 1144   GLUCOSEU >=500 (A) 06/25/2019 1144   HGBUR SMALL (A) 06/25/2019 1144   BILIRUBINUR NEGATIVE 06/25/2019 1144   BILIRUBINUR Negative 02/27/2017 1555   KETONESUR 5 (A)  06/25/2019 [place] 06/25/2019 1144   NITRITE NEGATIVE 06/25/2019 1144   LEUKOCYTESUR NEGATIVE 06/25/2019 1144    Recent Results (from the past 240 hour(s))  SARS Coronavirus 2 by RT PCR (hospital order, performed in [facility] hospital lab) Nasopharyngeal Nasopharyngeal Swab     Status: None   Collection Time: 06/25/19  1:08 PM   Specimen: Nasopharyngeal Swab  Result Value Ref Range Status   SARS Coronavirus 2 NEGATIVE NEGATIVE Final    Comment: (NOTE) SARS-CoV-2 target nucleic acids are NOT DETECTED.  The SARS-CoV-2 RNA is generally detectable in upper and lower respiratory specimens during the acute phase of infection. The lowest concentration of SARS-CoV-2 viral copies this assay can detect is 250 copies / mL. A negative result does not preclude SARS-CoV-2 infection and should not be used as the sole basis for treatment or other patient management decisions.  A negative result may occur with improper specimen collection / handling, submission of specimen other than nasopharyngeal swab, presence of viral mutation(s) within the areas targeted by this assay, and inadequate number of viral copies (<250 copies / mL). A negative result must be combined with clinical observations, patient history, and  epidemiological information.  Fact Sheet for Patients:   StrictlyIdeas.no  Fact Sheet for Healthcare Providers: BankingDealers.co.za  This test is not yet approved or  cleared by the [place] FDA and has been authorized for detection and/or diagnosis of SARS-CoV-2 by FDA under an Emergency Use Authorization (EUA).  This EUA will remain in effect (meaning this test can be used) for the duration of the COVID-19 declaration under Section 564(b)(1) of the Act, 21 U.S.C. section 360bbb-3(b)(1), unless the authorization is terminated or revoked sooner.  Performed at [facility], [place] [address], [place], [place] [address]      Radiological Exams on Admission: CT Head Wo Contrast  Result Date: 06/25/2019 CLINICAL DATA:  Frequent falls EXAM: CT HEAD WITHOUT CONTRAST TECHNIQUE: Contiguous axial images were obtained from the base of the skull through the vertex without intravenous contrast. COMPARISON:  None. FINDINGS: Brain: There is no acute intracranial hemorrhage, mass effect, or edema. Gray-white differentiation is preserved. There is no extra-axial fluid collection. Prominence of the ventricles and sulci reflects mild generalized parenchymal volume loss. Vascular: There is mild atherosclerotic calcification at the skull base. Skull: Calvarium is unremarkable. Sinuses/Orbits: No acute finding. Other: None. IMPRESSION: No acute intracranial hemorrhage, mass effect, or evidence of acute infarction. Electronically Signed   By: Macy Mis M.D.   On: 06/25/2019 14:43   CT Angio Chest PE W/Cm &/Or Wo Cm  Result Date: 06/25/2019 CLINICAL DATA:  64 year old male with history of shortness of breath. EXAM: CT ANGIOGRAPHY CHEST WITH CONTRAST TECHNIQUE: Multidetector CT imaging of the chest was performed using the standard protocol during bolus administration of intravenous contrast. Multiplanar CT image reconstructions and MIPs  were obtained to evaluate the vascular anatomy. CONTRAST:  51mL OMNIPAQUE IOHEXOL 350 MG/ML SOLN COMPARISON:  Chest CTA 07/02/2017. FINDINGS: Cardiovascular: Today's study is significantly limited by large amount of patient respiratory motion. With this limitation in mind, there is no definite central, lobar or segmental sized pulmonary embolism. Smaller subsegmental sized pulmonary emboli cannot be completely excluded given the limitations of today's examination. Heart size is normal. There is no significant pericardial fluid, thickening or pericardial calcification. Aortic atherosclerosis. No definite coronary artery calcifications. Mediastinum/Nodes: No pathologically enlarged mediastinal or hilar lymph nodes. Esophagus is unremarkable in appearance. No axillary lymphadenopathy. Lungs/Pleura: No suspicious appearing pulmonary  nodules or masses are noted. No acute consolidative airspace disease. No pleural effusions. Scattered areas of mild linear scarring in the lung bases bilaterally. Upper Abdomen: Severe diffuse low attenuation throughout the hepatic parenchyma, indicative of severe hepatic steatosis. 1 cm exophytic high attenuation lesion (58 HU) lesion in the upper pole the left kidney, incompletely characterized. 4.5 cm low-attenuation lesion in the upper pole the left kidney, compatible with a simple cyst. Small low-attenuation lesion incompletely imaged in the upper pole the right kidney, also likely to represent a small cyst. Musculoskeletal: Severe dextroscoliosis of the thoracolumbar spine. There are no aggressive appearing lytic or blastic lesions noted in the visualized portions of the skeleton. Review of the MIP images confirms the above findings. IMPRESSION: 1. Limited examination secondary to patient respiratory motion demonstrating no central, lobar or segmental sized pulmonary embolism. 2. No acute findings are noted in the thorax to account for the patient's symptoms. 3. Aortic atherosclerosis.  4. Severe hepatic steatosis. 5. 1 cm exophytic high attenuation lesion in the upper pole the left kidney, incompletely characterized on today's examination. Follow-up evaluation with nonemergent abdominal MRI with and without IV gadolinium is strongly recommended in the near future to exclude the possibility of neoplasm. Aortic Atherosclerosis (ICD10-I70.0). Electronically Signed   By: Vinnie Langton M.D.   On: 06/25/2019 14:47   CT Cervical Spine Wo Contrast  Result Date: 06/25/2019 CLINICAL DATA:  Multiple falls EXAM: CT CERVICAL SPINE WITHOUT CONTRAST TECHNIQUE: Multidetector CT imaging of the cervical spine was performed without intravenous contrast. Multiplanar CT image reconstructions were also generated. COMPARISON:  None. FINDINGS: Alignment: Facet joints aligned without dislocation. Dens and lateral masses aligned. No static listhesis. Skull base and vertebrae: No acute fracture. Rounded 8 mm area of lucency within the right posterolateral aspect of the C3 vertebral body (series 6, image 21). 9 mm sclerotic lesion within the right posterolateral aspect of the C6 vertebral body (series 5, image 21). Lesions are nonexpansile. Soft tissues and spinal canal: No prevertebral fluid or swelling. No visible canal hematoma. Disc levels: Intervertebral disc height loss at C3-4 through C5-6. No evidence of high-grade foraminal or canal stenosis by CT. Upper chest: Visualized lung apices are clear. Other: None. IMPRESSION: 1. No acute fracture or static listhesis of the cervical spine. 2. Mild multilevel degenerative changes of the cervical spine. 3. Rounded 8 mm area of lucency within the C3 vertebral body and 9 mm sclerotic lesion within the C6 vertebral body. Findings are nonspecific and may represent benign lesions such as hemangioma and bone island. However, if there is concern for metastatic disease, a nonemergent nuclear medicine bone scan could be performed for further evaluation. Electronically Signed    By: Davina Poke D.O.   On: 06/25/2019 14:34   DG Chest Port 1 View  Result Date: 06/25/2019 CLINICAL DATA:  Shortness of breath. Additional provided: History of COPD, CHF, shortness of breath, hypertension. EXAM: PORTABLE CHEST 1 VIEW COMPARISON:  Chest radiograph 01/06/2019 FINDINGS: Unchanged cardiomegaly. Hazy opacity throughout the left mid to lower lung field. The right lung is grossly clear. No evidence of pneumothorax. No acute bony abnormality. Redemonstrated prominent thoracic dextrocurvature. Healed fracture of the posterior left eighth rib. IMPRESSION: Hazy opacity throughout the left mid to lower lung field. This may reflect pneumonia, atelectasis asymmetric edema or possibly a layering pleural effusion. The right lung is grossly clear. Unchanged cardiomegaly. Electronically Signed   By: Kellie Simmering DO   On: 06/25/2019 12:30    EKG: Independently reviewed. Significant myoclonic artifact  with what appears to be normal sinus rhythm.  Assessment/Plan Active Problems:   HTN (hypertension)   Anxiety state   Essential tremor   Renal artery stenosis in 1 of 2 vessels (HCC)   Transaminitis   SOB (shortness of breath)   Chronic respiratory failure with hypoxia and hypercapnia (HCC)   OSA (obstructive sleep apnea)   Hyperkalemia   Hyponatremia   ARF (acute renal failure) (HCC)   Acute renal failure: Suspect combination of relative dehydration (hyaline casts on UA) superimposed on diuretic and ARB use. No hydronephrosis mentioned on non-dedicated imaging.   - Trend BMP, will give gentle IVF and allow diet overnight.  - Check FENa, UPr:Cr - Has hx 1-59% RAS, will check abdominal MRI as discussed below nonemergently, likely during this hospitalization.   Fever: Unclear etiology as the patient has no localizing symptoms. No pulmonary infiltrate, pyuria or urinary symptoms, SSTI on exam, meningismus. Scarring at bases but no significant atelectasis, and no typical provocative drugs.  Covid negative and fully vaccinated ?other viral syndrome w/LFT elevation, thrombocytopenia, weakness.  - Monitor blood and urine cultures - Will not continue antibiotics at this time (received CTX) due to hemodynamic stability and diagnostic uncertainty.  Hyperkalemia: Recurrent problem.  - Will hold spironolactone and ARB. Would be in favor of switching back to lasix from spironolactone, though will defer to cardiology as outpatient.  - Hydration as above - Lokelma x1, Ca Gluconate.  - Continue cardiac monitoring.   Hyponatremia: Recurrent problem/chronic, suspect hypovolemic, possibly exacerbated by EtOH.  - Will correct slowly. Give NS at 75cc/hr.  - May need to institute fluid restriction - Checking urine and serum osm's and serum and urine sodium as initial work up.  - Holding diuretics which are contributing.   Chronic HFpEF (LVEF wnl, G2DD), longstanding resistant HTN: BNP elevated but lower than previous value and does not appear overloaded by exam. No effusions or airspace opacities on CTA chest.  - Hold spironolactone and ARB - Continue metoprolol (next dose tmrw AM), and norvasc.  - Monitor daily weights, I/O  Frequent falls, progressive generalized weakness:  - Neurology recommendations appreciated - PT/OT consulted.   Essential tremor: Worsening with ataxic features and increasing falls. ?if worsened with gabapentin in setting of renal failure.  - Discussed with neurology, will proceed with MRI brain to evaluate for cerebellar degeneration.  - Given also presence of peripheral neuropathy, will check vitamin B12, ceruloplasmin, thiamine, A1c. Supplement thiamine empirically as above.  OSA, chronic nocturnal hypoxemic respiratory failure, restrictive lung disease:  - Continue CPAP with 2L O2 qHS.   History of perforated duodenal ulcer s/p [name] Jan 2021: Subsequent EGD confirmed resolution of duodenal ulceration. H. pylori has been negative.  - Continue lifelong  PPI.  - Avoid NSAIDs  Chronic hyperchromic anemia: Unclear etiology.  - Check T01, folic acid, iron panel.  Thrombocytopenia: ?if this is due to hepatic steatosis vs. early sepsis.  - Continue monitoring. Ok to give prophylactic heparin [place].  Alcohol overuse: No history of withdrawal and is drinking less alcohol than previously.  - Alcohol moderation counseling performed.  - Monitor for the need to initiate CIWA protocol   LFT elevation and hepatic steatosis:  - Alcohol moderation counseling provided, will attempt to avoid tylenol, though NSAIDs also not an option w/duodenal ulcer hx.  - Trend LFTs, elevation is more mild than prior presentation values in Jan 2021.   Exophytic high-attenuation left upper pole renal lesion (1cm): Seen incidentally on CTA chest. - Discussed with  patient and wife, will plan on nonemergent abdominal MRI. Will defer to after neuroimaging as this will require contrast (w/ and w/o).   Rounded 8 mm area of lucency within the C3 vertebral body and 9 mm sclerotic lesion within the C6 vertebral body: Incidental findings on cervical spine CT at admission. This is nonspecific.  - Will plan nonemergent nuclear medicine bone scan  Aortic atherosclerosis: Noted.   Anxiety:  - Continue prn xanax as above.   History of left foot fractures s/p ORIF and recent closed nondisplaced fracture of the base of the 5th metatarsal. - WBAT in CAM boot, PT/OT  DVT prophylaxis: Subcutaneous heparin  Code Status: Full confirmed at admission  Family Communication: Wife Disposition Plan: Admit to telemetry, PT/OT ordered to inform ultimate disposition Consults called: Neurology, spoke with Ruta Hinds, NP.   Admission status: Inpatient    Patrecia Pour, MD [facility] [email or web address] 06/25/2019, 6:37 PM

## 2019-06-25 NOTE — ED Notes (Signed)
Pt returned from MRI °

## 2019-06-25 NOTE — Consult Note (Addendum)
NEUROHOSPITALISTS-CONSULTATION NOTE    Requesting Physician:   Chief Complaint: Tremor and falls    History obtained from:  Patient, patients wife and chart review    HPI   Louis [name] is a 64 year old male w/pmh of anxiety, BPH, CHF, COPD, essential tremor, GERD, HTN, who presents with tremor, ataxia and falls.  Per the patients wife, his first fall was in May of 2020 where he fell down the stairs resulting in a lisfranc fracture of his left foot. After this fall he also developed neuropathy in both of his feet. After the fall, he worked with a Occupational psychologist.   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 He fell off of a stool. Subsequently he had four more falls for a total of five falls in the past two weeks. In addition, his intention tremor worsened two weeks ago. He has had this tremor since the age of 66, primarily in his hands and occasionally per his wife, has noted a generalized tremor that has involved his hands and feet. For the past two weeks the tremor has been more generalized with proximal twitching that has become much more frequent.   He also noted leg weakness two weeks ago. This bilateral leg weakness has fluctuated; at times he is able to walk around without any issues or get up without any issues. However on Monday he had difficulty getting out of bed before being able to do so. This morning he was too weak to get up and this is what drove his wife to bring him in to the ED. She has been trying to get the patient in to see Neurology however needed a referral and has an upcoming appointment with his PCP for this.   He denies blurry vision, double vision, head pain, neck stiffness. At baseline he has burning pain in his feet at night due to his neuropathy.   In addition, his wife has noted increased drowsiness the past two weeks. She was not aware of fever. His temperature is 101.7 in the  emergency room.   His wife also brings up history of ataxia in his family. His 2 siblings have "some type of ataxia" for which they were seen at an ataxia clinic.      Past Medical History    Past Medical History:  Diagnosis Date  . Anxiety    situational  . BPH (benign prostatic hyperplasia)    on flomax  . Bundle branch block    per prior PCP records  . CHF (congestive heart failure) ([place])   . COPD (chronic obstructive pulmonary disease) ([place])   . Diverticulosis    by colonoscopy  . Eczema    per prior PCP records  . Elevated PSA 2015   peaked 6s, s/p benign biopsy 2014, sees urology [name] ([place])  . Essential tremor   . GERD (gastroesophageal reflux disease)    per prior PCP records  . History of panic attacks    per prior PCP records  . HTN (hypertension)   . Mild intermittent asthma in adult without complication   . Obesity, Class I, BMI 30-34.9   . Osteoporosis    DEXA 06/2013 with osteopenia - h/o ?wrist/hip fracture from AVN from chronic steroid use (asthma), took reclast for 1 year  . Renal artery stenosis in 1 of 2 vessels ([place]) 2011   by [place]  . Seasonal allergies   .  Sleep apnea   . Thoracic scoliosis childhood  . Transaminitis    per prior PCP records     Past Surgical History   Past Surgical History:  Procedure Laterality Date  . APPLICATION OF WOUND VAC Left 05/09/2018   Procedure: Application Of Wound Vac;  Surgeon: Newt Minion, MD;  Location: [facility];  Service: Orthopedics;  Laterality: Left;  . BIOPSY  04/30/2019   Procedure: BIOPSY;  Surgeon: Juanita Craver, MD;  Location: [facility];  Service: Endoscopy;;  . COLONOSCOPY  12/2013   polyps, int hem, diverticulosis, rpt 5 yrs ([place])  . COLONOSCOPY WITH PROPOFOL N/A 04/30/2019   Procedure: COLONOSCOPY WITH PROPOFOL;  Surgeon: Juanita Craver, MD;  Location: [facility];  Service: Endoscopy;  Laterality: N/A;  . ELBOW SURGERY Right 2008   golfer's elbow  . ESOPHAGOGASTRODUODENOSCOPY (EGD) WITH  PROPOFOL N/A 04/30/2019   Procedure: ESOPHAGOGASTRODUODENOSCOPY (EGD) WITH PROPOFOL;  Surgeon: Juanita Craver, MD;  Location: [facility];  Service: Endoscopy;  Laterality: N/A;  . INGUINAL HERNIA REPAIR Bilateral childhood & ~1995  . LAPAROTOMY N/A 01/05/2019   Procedure: EXPLORATORY LAPAROTOMY graham patch repair;  Surgeon: Benjamine Sprague, DO;  Location: [facility];  Service: General;  Laterality: N/A;  . NASAL SEPTUM SURGERY  2013   deviated septum  . ORIF ANKLE FRACTURE Left 05/09/2018   Procedure: OPEN REDUCTION INTERNAL FIXATION LEFT LISFRANC FRACTURE/DISLOCATION;  Surgeon: Newt Minion, MD;  Location: [facility];  Service: Orthopedics;  Laterality: Left;  . POLYPECTOMY  04/30/2019   Procedure: POLYPECTOMY;  Surgeon: Juanita Craver, MD;  Location: [facility];  Service: Endoscopy;;  . US ECHOCARDIOGRAPHY  02/2009   WNL, EF >55% [facility])  . US RENAL/AORTA Right 05/2009   1-59% diameter reduction renal artery, rec rpt 2 yrs     Family History   Family History  Problem Relation Age of Onset  . Cancer Father 20       colon  . Prostate cancer Father   . Diabetes Mother   . Alcohol abuse Mother   . Stroke Mother   . Cancer Maternal Grandmother        pancreatic  . Ataxia Brother   . Ataxia Sister   . CAD Neg Hx     Social History  Social History:  reports that he has never smoked. He has never used smokeless tobacco. He reports current alcohol use of about 14.0 standard drinks of alcohol per week. He reports that he does not use drugs.   Allergies  Allergies:  Allergies  Allergen Reactions  . Accupril [Quinapril Hcl] Cough  . Nsaids     Perforated gastric ulcer     ROS  Negative except for HPI    Physical Examination          Vitals:    03/24/17 1445 03/24/17 1515 03/24/17 1545 03/24/17 1600  BP: 103/68 (!) 93/58 118/76 (!) 120/101  Pulse: (!) 58 (!) 56 (!) 59 (!) 59  Resp: 17 15 16 14   Temp:          TempSrc:          SpO2: 98% 99% 100% 100%  Weight:          Height:             HEENT-  Normocephalic,  Cardiovascular - Regular rate and rhythm  Respiratory -  Non-labored breathing, No wheezing. Abdomen - soft and non-tender, BS normal Extremities- no edema or cyanosis Skin-warm and dry   Neurological Examination  Blood pressure 93/61,  pulse 78, temperature (!) 101.7 F (38.7 C), temperature source Rectal, resp. rate (!) 24, SpO2 95 %.   Mental Status: Alert, oriented to self, month, date, states it is 2022, thought content appropriate.  Speech fluent without evidence of aphasia.  Able to follow 3 step commands without difficulty. Cranial Nerves: II: Discs flat bilaterally; Visual fields grossly normal,  III,IV, VI: ptosis not present, extra-ocular motions intact bilaterally, pupils equal, round, reactive to light and accommodation V,VII: smile symmetric, facial light touch sensation normal bilaterally VIII: hearing normal bilaterally IX,X: uvula rises symmetrically XI: bilateral shoulder shrug XII: midline tongue extension Motor: (R/L) Triceps: 5/5 Deltoid: 5/5 Biceps: 5/5 Hip abduction 5/5 Hip adduction 5/5 Knee extension: +4/+4 Knee flexion: 5/5 Dorsi/plantar flexion 5/5  Tremor noted to bilateral arms ( proximal and distal) as well as to bilateral lower extremities distally in the feet. No asterixis noted.  Tone and bulk: Paratonia throughout; no atrophy noted Sensory: Impaired proprioception in bilateral feet,  Deep Tendon Reflexes: Absent ankle reflexes, 1+ bilateral knee reflexes Cerebellar: FNF intact  Gait:  Laboratory Results   Recent Labs  Lab 06/25/19 1335  NA 125*  K 5.8*  CL 87*  CO2 27  GLUCOSE 130*  BUN 33*  CREATININE 1.99*  CALCIUM 9.1    No results for input(s): CHOL, TRIG, HDL, CHOLHDL, VLDL, LDLCALC in the last 168 hours.   Recent Labs  Lab 06/25/19 1226  GLUCAP 140*     Imaging Results   Imaging: CT Head Wo Contrast  Result Date: 06/25/2019 CLINICAL DATA:  Frequent falls EXAM: CT HEAD WITHOUT CONTRAST  TECHNIQUE: Contiguous axial images were obtained from the base of the skull through the vertex without intravenous contrast. COMPARISON:  None. FINDINGS: Brain: There is no acute intracranial hemorrhage, mass effect, or edema. Gray-white differentiation is preserved. There is no extra-axial fluid collection. Prominence of the ventricles and sulci reflects mild generalized parenchymal volume loss. Vascular: There is mild atherosclerotic calcification at the skull base. Skull: Calvarium is unremarkable. Sinuses/Orbits: No acute finding. Other: None. IMPRESSION: No acute intracranial hemorrhage, mass effect, or evidence of acute infarction. Electronically Signed   By: Macy Mis M.D.   On: 06/25/2019 14:43   CT Angio Chest PE W/Cm &/Or Wo Cm  Result Date: 06/25/2019 CLINICAL DATA:  64 year old male with history of shortness of breath. EXAM: CT ANGIOGRAPHY CHEST WITH CONTRAST TECHNIQUE: Multidetector CT imaging of the chest was performed using the standard protocol during bolus administration of intravenous contrast. Multiplanar CT image reconstructions and MIPs were obtained to evaluate the vascular anatomy. CONTRAST:  36mL OMNIPAQUE IOHEXOL 350 MG/ML SOLN COMPARISON:  Chest CTA 07/02/2017. FINDINGS: Cardiovascular: Today's study is significantly limited by large amount of patient respiratory motion. With this limitation in mind, there is no definite central, lobar or segmental sized pulmonary embolism. Smaller subsegmental sized pulmonary emboli cannot be completely excluded given the limitations of today's examination. Heart size is normal. There is no significant pericardial fluid, thickening or pericardial calcification. Aortic atherosclerosis. No definite coronary artery calcifications. Mediastinum/Nodes: No pathologically enlarged mediastinal or hilar lymph nodes. Esophagus is unremarkable in appearance. No axillary lymphadenopathy. Lungs/Pleura: No suspicious appearing pulmonary nodules or masses are  noted. No acute consolidative airspace disease. No pleural effusions. Scattered areas of mild linear scarring in the lung bases bilaterally. Upper Abdomen: Severe diffuse low attenuation throughout the hepatic parenchyma, indicative of severe hepatic steatosis. 1 cm exophytic high attenuation lesion (58 HU) lesion in the upper pole the left kidney, incompletely characterized. 4.5 cm low-attenuation  lesion in the upper pole the left kidney, compatible with a simple cyst. Small low-attenuation lesion incompletely imaged in the upper pole the right kidney, also likely to represent a small cyst. Musculoskeletal: Severe dextroscoliosis of the thoracolumbar spine. There are no aggressive appearing lytic or blastic lesions noted in the visualized portions of the skeleton. Review of the MIP images confirms the above findings. IMPRESSION: 1. Limited examination secondary to patient respiratory motion demonstrating no central, lobar or segmental sized pulmonary embolism. 2. No acute findings are noted in the thorax to account for the patient's symptoms. 3. Aortic atherosclerosis. 4. Severe hepatic steatosis. 5. 1 cm exophytic high attenuation lesion in the upper pole the left kidney, incompletely characterized on today's examination. Follow-up evaluation with nonemergent abdominal MRI with and without IV gadolinium is strongly recommended in the near future to exclude the possibility of neoplasm. Aortic Atherosclerosis (ICD10-I70.0). Electronically Signed   By: Vinnie Langton M.D.   On: 06/25/2019 14:47   CT Cervical Spine Wo Contrast  Result Date: 06/25/2019 CLINICAL DATA:  Multiple falls EXAM: CT CERVICAL SPINE WITHOUT CONTRAST TECHNIQUE: Multidetector CT imaging of the cervical spine was performed without intravenous contrast. Multiplanar CT image reconstructions were also generated. COMPARISON:  None. FINDINGS: Alignment: Facet joints aligned without dislocation. Dens and lateral masses aligned. No static listhesis.  Skull base and vertebrae: No acute fracture. Rounded 8 mm area of lucency within the right posterolateral aspect of the C3 vertebral body (series 6, image 21). 9 mm sclerotic lesion within the right posterolateral aspect of the C6 vertebral body (series 5, image 21). Lesions are nonexpansile. Soft tissues and spinal [name]: No prevertebral fluid or swelling. No visible [name] hematoma. Disc levels: Intervertebral disc height loss at C3-4 through C5-6. No evidence of high-grade foraminal or [name] stenosis by CT. Upper chest: Visualized lung apices are clear. Other: None. IMPRESSION: 1. No acute fracture or static listhesis of the cervical spine. 2. Mild multilevel degenerative changes of the cervical spine. 3. Rounded 8 mm area of lucency within the C3 vertebral body and 9 mm sclerotic lesion within the C6 vertebral body. Findings are nonspecific and may represent benign lesions such as hemangioma and bone island. However, if there is concern for metastatic disease, a nonemergent nuclear medicine bone scan could be performed for further evaluation. Electronically Signed   By: Davina Poke D.O.   On: 06/25/2019 14:34   DG Chest Port 1 View  Result Date: 06/25/2019 CLINICAL DATA:  Shortness of breath. Additional provided: History of COPD, CHF, shortness of breath, hypertension. EXAM: PORTABLE CHEST 1 VIEW COMPARISON:  Chest radiograph 01/06/2019 FINDINGS: Unchanged cardiomegaly. Hazy opacity throughout the left mid to lower lung field. The right lung is grossly clear. No evidence of pneumothorax. No acute bony abnormality. Redemonstrated prominent thoracic dextrocurvature. Healed fracture of the posterior left eighth rib. IMPRESSION: Hazy opacity throughout the left mid to lower lung field. This may reflect pneumonia, atelectasis asymmetric edema or possibly a layering pleural effusion. The right lung is grossly clear. Unchanged cardiomegaly. Electronically Signed   By: Kellie Simmering DO   On: 06/25/2019 12:30      IMPRESSION AND PLAN  Louis [name] is a 64 year old male w/pmh of anxiety, BPH, CHF, COPD, essential tremor, GERD, HTN, who presents with increased tremor and falls. His physical exam is pertinent for tremor noted proximally and distally to bilateral upper extremities and distally to the feet in his lower extremities. His imaging done so far includes CTH that was negative for acute  ischemic stroke or hemorrhage. His CT Cervical Spine revealed no acute fractures; did show multi level degenerative changes at the level of the cervical spine. He is febrile with a normal WBC and his BMP is pertinent for hyponatremia(125) + hyperkalemia(5.8). His worsened tremor and falls could be due to multiple causes including hyponatremia and potential infection. Will proceed with an MRI of the Brain to evaluate for cerebellar degeneration which could also contribute to tremor.    Recommendations:  - MRI of the Brain WO Contrast - Check ammonia level  - Slowly correct electrolyte abnormalities  - Will defer treatment of possible underlying infectious process to primary team - Neurology will continue to follow  Ruta Hinds, NP  [facility]  Patient seen and discussed with attending physician Dr. Lorraine Lax    NEUROHOSPITALIST ADDENDUM Performed a face to face diagnostic evaluation on 06/26/19.  I have reviewed the contents of history and physical exam as documented by PA/ARNP/Resident and agree with above documentation.  I have discussed and formulated the above plan as documented. Edits to the note have been made as needed.   64 year old male with past medical history of anxiety, BPH, CHF, COPD, essential tremors, neuropathy, moderate to excessive alcohol use, family history of hereditary ataxia ( ?SCA) who presents to emergency department with increasing confusion, gait difficulty over 2 weeks, 5 falls.   Chronic issues include tremors that are positional ongoing for several years, neuropathy which  apparently began after a fall in May 2020 where he fractured his left foot-was told by Orthopedics doctor to his wife who is a OB/GYN specialist that neuropathy was likely post surgery and can occur on both legs??[name]    However what brings him to the hospital this time-2 weeks of confusion, multiple falls and worsening tremors/? myoclonus for which neurology was consulted.  Work-up so far revealed sodium of 125, hyperkalemia of 5.8.  Patient also has been febrile-UA showed showed bacteria but no leukoesterase or nitrates.  CT head was unremarkable for acute findings-showed generalized atrophy.  Ammonia was obtained which was normal.  MRI brain was also obtained which shows mild atrophy, and not specific cerebellar degeneration.  Patient also noted to be febrile-today blood cultures revealed that he is growing Enterococcus.  Further infectious work-up pending with TEE being planned tomorrow.  B12 also low and is being replaced.   On examination -Alert oriented x3 however unable to name president.  Slightly confused.  No neck rigidity and cranial nerve examination is normal.  Has high-frequency tremors in bilateral upper extremities that are postural and improves with rest.  Similarly with both legs.  No asterixis/polymyoclonus seen.  Motor exam-5 x 5 strength extremities, 4+ /5 bilateral lower extremities.  Tone is normal.  Sensory exam is abnormal: Reduced sensation from to light touch/temperature below the knee, reduced proprioception at the great toe.  Unable to stand due to significant ataxia.  No dysmetria on finger-to-nose bilaterally.  Impression  Acute toxic metabolic encephalopathy-likely multifactorial from hyponatremia, sepsis, fever.  Absence of neck rigidity makes meningitis less likely-we will defer LP for now.  Tremors: Likely essential-high-frequency postural.  No significant dysmetria makes this less likely cerebellar tremors.  MRI of the brain does show some atrophy, but not  particularly suggestive of spinocerebellar atrophy.  Work-up and management of tremors better done as an outpatient including genetic testing if needed.  Polyneuropathy -Likely explanation for significant ataxia and multiple falls, patient likely compensating but unable to do so in the setting of infection and electrolyte disturbances. -  Likely related to alcohol, B12 deficiency, further labs pending.  Will need outpatient EMG nerve conduction study.  Will need extensive PT OT when patient is more stable.       Karena Addison Ajdin Macke MD [facility] [phone]   If 7pm to 7am, please call on call as listed on AMION.

## 2019-06-25 NOTE — ED Notes (Addendum)
Finally able to obtain second blood culture. Patient transported to CT.  Will start antibiotics when he return.

## 2019-06-25 NOTE — ED Notes (Signed)
Patient transported to MRI 

## 2019-06-25 NOTE — ED Triage Notes (Signed)
Per [facility] pt from home for worsening tremors and multiple falls recently over past year but past month gotten even worse. Pt has neuropathy and tremors and PCP is falling this and has an appt to see them tomorrow. Has not seen a neurologist for these issues.  Vitals: 128/76, 96HR, 95% on RA. 20R, 98.6 temp, CBg 141.

## 2019-06-25 NOTE — ED Notes (Signed)
Pt had BM in the bed.  RN and I cleaned pt up and removed soiled linen.  Pt states he needs to have another BM, so he was placed on a bed pan.  Pt has call light w/in reach and was instructed to call out when he is finished.

## 2019-06-25 NOTE — ED Notes (Signed)
Pt was cleaned and moved onto a hospital bed for comfort by this Probation officer and RN Evelena Peat. Call light, room phone, and cell phone are in reach.

## 2019-06-25 NOTE — ED Provider Notes (Signed)
Louis [name] DEPT Provider Note   CSN: [id number] Arrival date & time: 06/25/19  1019     History Chief Complaint  Patient presents with  . Tremors  . Fall    Louis [name] is a 64 y.o. male with pertinent past medical history of anxiety, CHF, COPD, diverticulosis, GERD, essential tremor, hypertension that presents the emergency department today for falls, tremor, shortness of breath.  Patient states that he has been having falls for over a year, has been more frequent this past week which is what brought him in.  Also mentions that he has been short of breath, this is normal for him since he does have heart failure he states however this past week he has been more short of breath.  Denies any URI-like symptoms, cough, S sore throat, congestion.  Denies any sick contacts.  Denies any chest pain.  Abdominal pain, nausea, vomiting.  States that he has had a couple episodes of diarrhea this morning, no hematochezia or melena.  Patient states that he has had some bouts of bowel incontinence, states that this has been more frequently occurring.  States that it stated a year ago, has happened multiple times this past month.  Denies any urinary incontinence, does admit to urinary frequency.  Denies any dysuria or hematuria.  Denies any back pain, headache, vision changes, dizziness.  Denies any shuffling gait, gait abnormalities.  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  Patient states that he has neuropathies in bilateral upper and lower extremities from previous falls, states that he has no new neuropathies.  Denies any speech difficulty, facial asymmetry, voice changes, weight changes.  HPI     Past Medical History:  Diagnosis Date  . Anxiety    situational  . BPH (benign prostatic hyperplasia)    on flomax  . Bundle branch block    per prior PCP records  . CHF (congestive heart failure) ([place])   .  COPD (chronic obstructive pulmonary disease) ([place])   . Diverticulosis    by colonoscopy  . Eczema    per prior PCP records  . Elevated PSA 2015   peaked 6s, s/p benign biopsy 2014, sees urology [name] ([place])  . Essential tremor   . GERD (gastroesophageal reflux disease)    per prior PCP records  . History of panic attacks    per prior PCP records  . HTN (hypertension)   . Mild intermittent asthma in adult without complication   . Obesity, Class I, BMI 30-34.9   . Osteoporosis    DEXA 06/2013 with osteopenia - h/o ?wrist/hip fracture from AVN from chronic steroid use (asthma), took reclast for 1 year  . Renal artery stenosis in 1 of 2 vessels ([place]) 2011   by [place]  . Seasonal allergies   . Sleep apnea   . Thoracic scoliosis childhood  . Transaminitis    per prior PCP records    Patient Active Problem List   Diagnosis Date Noted  . ARF (acute renal failure) ([place]) 06/25/2019  . Perforated viscus 01/06/2019  . Severe sepsis with septic shock ([place]) 01/06/2019  . Acute renal failure ([place]) 01/06/2019  . Hyperkalemia 01/06/2019  . Hyponatremia 01/06/2019  . Lactic acidosis 01/06/2019  . Onychomycosis 05/19/2018  . Foot fracture, left, closed, initial encounter 05/09/2018  . Lisfranc dislocation, left, initial encounter   . Acute bronchitis 02/25/2018  . Primary pulmonary hypertension ([place]) 07/09/2017  .  Diastolic dysfunction 59/56/3875  . OSA (obstructive sleep apnea) 07/08/2017  . SOB (shortness of breath) 07/02/2017  . Chronic respiratory failure with hypoxia and hypercapnia ([place]) 07/02/2017  . Cardiomegaly 07/02/2017  . Exposure keratitis 02/17/2017  . Pedal edema 12/18/2016  . Transaminitis 12/18/2016  . Bilateral pes planus 12/18/2016  . Psoriasis-eczema overlap condition 07/30/2016  . High serum high density lipoprotein (HDL) 09/26/2015  . Epistaxis 09/26/2015  . Renal artery stenosis in 1 of 2 vessels ([place])   . Essential tremor   . Elevated PSA   . GERD  (gastroesophageal reflux disease)   . Health maintenance examination 10/08/2014  . Anxiety state 10/08/2014  . Obesity, Class I, BMI 30-34.9   . Mild intermittent asthma in adult without complication   . Seasonal allergies   . HTN (hypertension)   . Scoliosis   . Osteoporosis     Past Surgical History:  Procedure Laterality Date  . APPLICATION OF WOUND VAC Left 05/09/2018   Procedure: Application Of Wound Vac;  Surgeon: Newt Minion, MD;  Location: [facility];  Service: Orthopedics;  Laterality: Left;  . BIOPSY  04/30/2019   Procedure: BIOPSY;  Surgeon: Juanita Craver, MD;  Location[facility];  Service: Endoscopy;;  . COLONOSCOPY  12/2013   polyps, int hem, diverticulosis, rpt 5 yrs ([place])  . COLONOSCOPY WITH PROPOFOL N/A 04/30/2019   Procedure: COLONOSCOPY WITH PROPOFOL;  Surgeon: Juanita Craver, MD;  Location: [facility];  Service: Endoscopy;  Laterality: N/A;  . ELBOW SURGERY Right 2008   golfer's elbow  . ESOPHAGOGASTRODUODENOSCOPY (EGD) WITH PROPOFOL N/A 04/30/2019   Procedure: ESOPHAGOGASTRODUODENOSCOPY (EGD) WITH PROPOFOL;  Surgeon: Juanita Craver, MD;  [facility];  Service: Endoscopy;  Laterality: N/A;  . INGUINAL HERNIA REPAIR Bilateral childhood & ~1995  . LAPAROTOMY N/A 01/05/2019   Procedure: EXPLORATORY LAPAROTOMY graham patch repair;  Surgeon: Benjamine Sprague, DO;  Location: [facility];  Service: General;  Laterality: N/A;  . NASAL SEPTUM SURGERY  2013   deviated septum  . ORIF ANKLE FRACTURE Left 05/09/2018   Procedure: OPEN REDUCTION INTERNAL FIXATION LEFT LISFRANC FRACTURE/DISLOCATION;  Surgeon: Newt Minion, MD;  Location: [facility];  Service: Orthopedics;  Laterality: Left;  . POLYPECTOMY  04/30/2019   Procedure: POLYPECTOMY;  Surgeon: Juanita Craver, MD;  [facility];  Service: Endoscopy;;  . US ECHOCARDIOGRAPHY  02/2009   WNL, EF >55% [facility])  . US RENAL/AORTA Right 05/2009   1-59% diameter reduction renal artery, rec rpt 2 yrs       Family History    Problem Relation Age of Onset  . Cancer Father 9       colon  . Prostate cancer Father   . Diabetes Mother   . Alcohol abuse Mother   . Stroke Mother   . Cancer Maternal Grandmother        pancreatic  . Ataxia Brother   . Ataxia Sister   . CAD Neg Hx     Social History   Tobacco Use  . Smoking status: Never Smoker  . Smokeless tobacco: Never Used  Vaping Use  . Vaping Use: Never used  Substance Use Topics  . Alcohol use: Yes    Alcohol/week: 14.0 standard drinks    Types: 14 Glasses of wine per week    Comment: Social  . Drug use: No    Home Medications Prior to Admission medications   Medication Sig Start Date End Date Taking? Authorizing Provider  ALPRAZolam Duanne Moron) 0.5 MG tablet Take 1 tablet (0.5 mg  total) by mouth daily as needed (essential tremor). 11/18/18  Yes Rutherford Guys, MD  amLODipine (NORVASC) 5 MG tablet TAKE 1 TABLET(5 MG) BY MOUTH DAILY Patient taking differently: Take 5 mg by mouth daily.  12/22/18  Yes Troy Sine, MD  gabapentin (NEURONTIN) 300 MG capsule Take 300 mg by mouth 2 (two) times daily.   Yes [provider]  losartan (COZAAR) 100 MG tablet Take 1 tablet (100 mg total) by mouth daily. NEED OV. 01/26/19  Yes Almyra Deforest, PA  metoprolol succinate (TOPROL-XL) 50 MG 24 hr tablet Take 3 tablets (150 mg total) by mouth daily. Take with or immediately following a meal. 01/26/19  Yes Meng, [place], [place]  spironolactone (ALDACTONE) 25 MG tablet TAKE 1 TABLET BY MOUTH EVERY DAY Patient taking differently: Take 25 mg by mouth daily.  10/14/18  Yes Troy Sine, MD  pantoprazole (PROTONIX) 40 MG tablet Take 1 tablet (40 mg total) by mouth daily. Patient not taking: Reported on 06/25/2019 01/09/19 01/09/20  Benjamine Sprague, DO    Allergies    Accupril [quinapril hcl] and Nsaids  Review of Systems   Review of Systems  Constitutional: Negative for chills, diaphoresis, fatigue and fever.  HENT: Negative for congestion, sore throat and trouble  swallowing.   Eyes: Negative for pain and visual disturbance.  Respiratory: Positive for shortness of breath. Negative for cough and wheezing.   Cardiovascular: Negative for chest pain, palpitations and leg swelling.  Gastrointestinal: Positive for diarrhea. Negative for abdominal distention, abdominal pain, nausea and vomiting.  Genitourinary: Positive for urgency. Negative for difficulty urinating, dysuria, enuresis, flank pain and frequency.  Musculoskeletal: Negative for back pain, neck pain and neck stiffness.  Skin: Negative for pallor.  Neurological: Positive for tremors and weakness. Negative for dizziness, syncope, speech difficulty and headaches.  Psychiatric/Behavioral: Negative for confusion.    Physical Exam Updated Vital Signs BP 93/61   Pulse 78   Temp (!) 101.7 F (38.7 C) (Rectal)   Resp (!) 24   SpO2 95%   Physical Exam Constitutional:      General: He is not in acute distress.    Appearance: Normal appearance. He is not ill-appearing, toxic-appearing or diaphoretic.     Comments: Patient is not in any acute respiratory distress, patient did desat to 1 while speaking to him, placed patient on 2 L and patient's oxygen improved to 100%.  HENT:     Head: Normocephalic and atraumatic.     Mouth/Throat:     Mouth: Mucous membranes are moist.     Pharynx: Oropharynx is clear.  Eyes:     General: No scleral icterus.    Extraocular Movements: Extraocular movements intact.     Pupils: Pupils are equal, round, and reactive to light.  Cardiovascular:     Rate and Rhythm: Normal rate and regular rhythm.     Pulses: Normal pulses.     Heart sounds: Normal heart sounds.  Pulmonary:     Effort: Pulmonary effort is normal. No respiratory distress.     Breath sounds: Normal breath sounds. No stridor. No wheezing, rhonchi or rales.  Chest:     Chest wall: No tenderness.  Abdominal:     General: Abdomen is flat. There is no distension.     Palpations: Abdomen is soft.      Tenderness: There is no abdominal tenderness. There is no guarding or rebound.  Musculoskeletal:        General: No swelling or tenderness. Normal range of motion.  Cervical back: Normal range of motion and neck supple. No rigidity.     Right lower leg: No edema.     Left lower leg: No edema.     Comments: Patient with bilateral upper extremity tremor, no cogwheel tremor.  Patient with 4/5 strength of upper and lower extremity.  Normal sensation throughout.  No calf tenderness bilaterally.  No pedal or lower leg edema.  Skin:    General: Skin is warm and dry.     Capillary Refill: Capillary refill takes less than 2 seconds.     Coloration: Skin is not pale.  Neurological:     General: No focal deficit present.     Mental Status: He is alert and oriented to person, place, and time.     Cranial Nerves: No cranial nerve deficit.     Sensory: Sensation is intact. No sensory deficit.     Motor: Weakness and tremor present. No atrophy, abnormal muscle tone or pronator drift.     Coordination: Romberg sign negative. Coordination normal.     Gait: Gait abnormal (Pt states that he can not walk due to weakness).     Deep Tendon Reflexes:     Reflex Scores:      Brachioradialis reflexes are 1+ on the right side and 1+ on the left side.      Patellar reflexes are 1+ on the right side and 1+ on the left side. Psychiatric:        Mood and Affect: Mood normal.        Behavior: Behavior normal.     ED Results / Procedures / Treatments   Labs (all labs ordered are listed, but only abnormal results are displayed) Labs Reviewed  CBC WITH DIFFERENTIAL/PLATELET - Abnormal; Notable for the following components:      Result Value   RBC 3.01 (*)    Hemoglobin 10.4 (*)    HCT 30.0 (*)    MCH 34.6 (*)    Platelets 133 (*)    Lymphs Abs 0.2 (*)    All other components within normal limits  URINALYSIS, ROUTINE W REFLEX MICROSCOPIC - Abnormal; Notable for the following components:   Glucose, UA  >=500 (*)    Hgb urine dipstick SMALL (*)    Ketones, ur 5 (*)    Bacteria, UA MANY (*)    All other components within normal limits  BRAIN NATRIURETIC PEPTIDE - Abnormal; Notable for the following components:   B Natriuretic Peptide 229.1 (*)    All other components within normal limits  D-DIMER, QUANTITATIVE (NOT AT [facility]) - Abnormal; Notable for the following components:   D-Dimer, Quant 1.90 (*)    All other components within normal limits  COMPREHENSIVE METABOLIC PANEL - Abnormal; Notable for the following components:   Sodium 125 (*)    Potassium 5.8 (*)    Chloride 87 (*)    Glucose, Bld 130 (*)    BUN 33 (*)    Creatinine, Ser 1.99 (*)    AST 68 (*)    ALT 57 (*)    GFR calc non Af Amer 35 (*)    GFR calc Af Amer 41 (*)    All other components within normal limits  CBG MONITORING, ED - Abnormal; Notable for the following components:   Glucose-Capillary 140 (*)    All other components within normal limits  SARS CORONAVIRUS 2 BY RT PCR (HOSPITAL ORDER, [facility])  CULTURE, BLOOD (ROUTINE X 2)  CULTURE, BLOOD (  ROUTINE X 2)  URINE CULTURE  LACTIC ACID, PLASMA  CK  SODIUM, URINE, RANDOM  PROTEIN / CREATININE RATIO, URINE  TROPONIN I (HIGH SENSITIVITY)    EKG EKG Interpretation  Date/Time:  Thursday June 25 2019 12:26:24 EDT Ventricular Rate:  174 PR Interval:    QRS Duration: 144 QT Interval:  358 QTC Calculation: 416 R Axis:   86 Text Interpretation: Poor data quality in current ECG precludes serial comparison Confirmed by Quintella Reichert [phone]) on 06/25/2019 1:52:35 PM   Radiology CT Head Wo Contrast  Result Date: 06/25/2019 CLINICAL DATA:  Frequent falls EXAM: CT HEAD WITHOUT CONTRAST TECHNIQUE: Contiguous axial images were obtained from the base of the skull through the vertex without intravenous contrast. COMPARISON:  None. FINDINGS: Brain: There is no acute intracranial hemorrhage, mass effect, or edema. Gray-white  differentiation is preserved. There is no extra-axial fluid collection. Prominence of the ventricles and sulci reflects mild generalized parenchymal volume loss. Vascular: There is mild atherosclerotic calcification at the skull base. Skull: Calvarium is unremarkable. Sinuses/Orbits: No acute finding. Other: None. IMPRESSION: No acute intracranial hemorrhage, mass effect, or evidence of acute infarction. Electronically Signed   By: Macy Mis M.D.   On: 06/25/2019 14:43   CT Angio Chest PE W/Cm &/Or Wo Cm  Result Date: 06/25/2019 CLINICAL DATA:  64 year old male with history of shortness of breath. EXAM: CT ANGIOGRAPHY CHEST WITH CONTRAST TECHNIQUE: Multidetector CT imaging of the chest was performed using the standard protocol during bolus administration of intravenous contrast. Multiplanar CT image reconstructions and MIPs were obtained to evaluate the vascular anatomy. CONTRAST:  82mL OMNIPAQUE IOHEXOL 350 MG/ML SOLN COMPARISON:  Chest CTA 07/02/2017. FINDINGS: Cardiovascular: Today's study is significantly limited by large amount of patient respiratory motion. With this limitation in mind, there is no definite central, lobar or segmental sized pulmonary embolism. Smaller subsegmental sized pulmonary emboli cannot be completely excluded given the limitations of today's examination. Heart size is normal. There is no significant pericardial fluid, thickening or pericardial calcification. Aortic atherosclerosis. No definite coronary artery calcifications. Mediastinum/Nodes: No pathologically enlarged mediastinal or hilar lymph nodes. Esophagus is unremarkable in appearance. No axillary lymphadenopathy. Lungs/Pleura: No suspicious appearing pulmonary nodules or masses are noted. No acute consolidative airspace disease. No pleural effusions. Scattered areas of mild linear scarring in the lung bases bilaterally. Upper Abdomen: Severe diffuse low attenuation throughout the hepatic parenchyma, indicative of  severe hepatic steatosis. 1 cm exophytic high attenuation lesion (58 HU) lesion in the upper pole the left kidney, incompletely characterized. 4.5 cm low-attenuation lesion in the upper pole the left kidney, compatible with a simple cyst. Small low-attenuation lesion incompletely imaged in the upper pole the right kidney, also likely to represent a small cyst. Musculoskeletal: Severe dextroscoliosis of the thoracolumbar spine. There are no aggressive appearing lytic or blastic lesions noted in the visualized portions of the skeleton. Review of the MIP images confirms the above findings. IMPRESSION: 1. Limited examination secondary to patient respiratory motion demonstrating no central, lobar or segmental sized pulmonary embolism. 2. No acute findings are noted in the thorax to account for the patient's symptoms. 3. Aortic atherosclerosis. 4. Severe hepatic steatosis. 5. 1 cm exophytic high attenuation lesion in the upper pole the left kidney, incompletely characterized on today's examination. Follow-up evaluation with nonemergent abdominal MRI with and without IV gadolinium is strongly recommended in the near future to exclude the possibility of neoplasm. Aortic Atherosclerosis (ICD10-I70.0). Electronically Signed   By: Vinnie Langton M.D.   On: 06/25/2019 14:47  CT Cervical Spine Wo Contrast  Result Date: 06/25/2019 CLINICAL DATA:  Multiple falls EXAM: CT CERVICAL SPINE WITHOUT CONTRAST TECHNIQUE: Multidetector CT imaging of the cervical spine was performed without intravenous contrast. Multiplanar CT image reconstructions were also generated. COMPARISON:  None. FINDINGS: Alignment: Facet joints aligned without dislocation. Dens and lateral masses aligned. No static listhesis. Skull base and vertebrae: No acute fracture. Rounded 8 mm area of lucency within the right posterolateral aspect of the C3 vertebral body (series 6, image 21). 9 mm sclerotic lesion within the right posterolateral aspect of the C6  vertebral body (series 5, image 21). Lesions are nonexpansile. Soft tissues and spinal canal: No prevertebral fluid or swelling. No visible canal hematoma. Disc levels: Intervertebral disc height loss at C3-4 through C5-6. No evidence of high-grade foraminal or canal stenosis by CT. Upper chest: Visualized lung apices are clear. Other: None. IMPRESSION: 1. No acute fracture or static listhesis of the cervical spine. 2. Mild multilevel degenerative changes of the cervical spine. 3. Rounded 8 mm area of lucency within the C3 vertebral body and 9 mm sclerotic lesion within the C6 vertebral body. Findings are nonspecific and may represent benign lesions such as hemangioma and bone island. However, if there is concern for metastatic disease, a nonemergent nuclear medicine bone scan could be performed for further evaluation. Electronically Signed   By: Davina Poke D.O.   On: 06/25/2019 14:34   DG Chest Port 1 View  Result Date: 06/25/2019 CLINICAL DATA:  Shortness of breath. Additional provided: History of COPD, CHF, shortness of breath, hypertension. EXAM: PORTABLE CHEST 1 VIEW COMPARISON:  Chest radiograph 01/06/2019 FINDINGS: Unchanged cardiomegaly. Hazy opacity throughout the left mid to lower lung field. The right lung is grossly clear. No evidence of pneumothorax. No acute bony abnormality. Redemonstrated prominent thoracic dextrocurvature. Healed fracture of the posterior left eighth rib. IMPRESSION: Hazy opacity throughout the left mid to lower lung field. This may reflect pneumonia, atelectasis asymmetric edema or possibly a layering pleural effusion. The right lung is grossly clear. Unchanged cardiomegaly. Electronically Signed   By: Kellie Simmering DO   On: 06/25/2019 12:30    Procedures Procedures (including critical care time)  Medications Ordered in ED Medications  sodium chloride (PF) 0.9 % injection (has no administration in time range)  cefTRIAXone (ROCEPHIN) 1 g in sodium chloride 0.9 %  100 mL IVPB ( Intravenous Stopped 06/25/19 1520)  acetaminophen (TYLENOL) tablet 650 mg (650 mg Oral Given 06/25/19 1302)  sodium chloride 0.9 % bolus 500 mL (500 mLs Intravenous New Bag/Given 06/25/19 1437)  iohexol (OMNIPAQUE) 350 MG/ML injection 75 mL (75 mLs Intravenous Contrast Given 06/25/19 1420)    ED Course  I have reviewed the triage vital signs and the nursing notes.  Pertinent labs & imaging results that were available during my care of the patient were reviewed by me and considered in my medical decision making (see chart for details).    MDM Rules/Calculators/A&P                         Louis [name] is a 64 y.o. male with pertinent past medical history of anxiety, CHF, COPD, diverticulosis, GERD, essential tremor, hypertension that presents the emergency department today for falls, tremor, shortness of breath.  Patient with fever of 100.4, rectal 101.7 will obtain basic labs lungs with blood cultures, lactic acid.  Will also give Rocephin at this time.  Regards patient's shortness of breath will order D-dimer, chest x-rays.  In regards neurological complaints we will also order CT head and CT spine at this time.  We will most likely consult neurology after these result.  D-dimer elevated, will also order CT angiogram at this time.  CBC and CMP stable.  Hemoglobin is at baseline.  BNP is also at baseline from last year.  CMP with electrolyte derangements including sodium of 125, potassium 5.8, creatinine of 1.99.  Patient does have AKI at this time.  We are giving fluids slowly, patient does have history of CHF.  First lactic acid normal.  CT angiogram without evidence of pulmonary embolism.  CT neck does show nonspecific findings.  Dr. Lorraine Lax, neurology, was consulted by Dr. Ralene Bathe.  He states that at this time patient needs medical admission and he will follow.  We will consult for admission at this time due to AKI with hyperkalemia and weakness. 322 spoke to hospitaslist, [place],who will  admit the patient.   The patient appears reasonably stabilized for admission considering the current resources, flow, and capabilities available in the ED at this time, and I doubt any other [facility] requiring further screening and/or treatment in the ED prior to admission.  I discussed this case with my attending physician who cosigned this note including patient's presenting symptoms, physical exam, and planned diagnostics and interventions. Attending physician stated agreement with plan or made changes to plan which were implemented.   Attending physician assessed patient at bedside.  Final Clinical Impression(s) / ED Diagnoses Final diagnoses:  Hyperkalemia  Weakness  AKI (acute kidney injury) [facility])    Rx / [place] Orders ED Discharge Orders    None       Alfredia Client, PA-C 06/25/19 1526    Quintella Reichert, MD 06/29/19 0745

## 2019-06-25 NOTE — ED Notes (Signed)
Spoke to pts wife regarding the pts care. Wife has been updated and all questions and concerns were addressed.

## 2019-06-26 ENCOUNTER — Inpatient Hospital Stay (HOSPITAL_COMMUNITY): Payer: 59

## 2019-06-26 ENCOUNTER — Ambulatory Visit: Payer: 59 | Admitting: Family Medicine

## 2019-06-26 DIAGNOSIS — J9612 Chronic respiratory failure with hypercapnia: Secondary | ICD-10-CM

## 2019-06-26 DIAGNOSIS — J9611 Chronic respiratory failure with hypoxia: Secondary | ICD-10-CM

## 2019-06-26 DIAGNOSIS — R7881 Bacteremia: Secondary | ICD-10-CM

## 2019-06-26 DIAGNOSIS — N179 Acute kidney failure, unspecified: Secondary | ICD-10-CM

## 2019-06-26 DIAGNOSIS — G4733 Obstructive sleep apnea (adult) (pediatric): Secondary | ICD-10-CM

## 2019-06-26 DIAGNOSIS — R531 Weakness: Secondary | ICD-10-CM

## 2019-06-26 DIAGNOSIS — G250 Essential tremor: Secondary | ICD-10-CM

## 2019-06-26 DIAGNOSIS — F411 Generalized anxiety disorder: Secondary | ICD-10-CM

## 2019-06-26 DIAGNOSIS — B952 Enterococcus as the cause of diseases classified elsewhere: Secondary | ICD-10-CM

## 2019-06-26 DIAGNOSIS — I10 Essential (primary) hypertension: Secondary | ICD-10-CM

## 2019-06-26 DIAGNOSIS — E875 Hyperkalemia: Secondary | ICD-10-CM

## 2019-06-26 LAB — BLOOD CULTURE ID PANEL (REFLEXED)

## 2019-06-26 LAB — COMPREHENSIVE METABOLIC PANEL
ALT: 116 U/L — ABNORMAL HIGH (ref 0–44)
AST: 268 U/L — ABNORMAL HIGH (ref 15–41)
Albumin: 3.8 g/dL (ref 3.5–5.0)
Alkaline Phosphatase: 70 U/L (ref 38–126)
Anion gap: 14 (ref 5–15)
BUN: 29 mg/dL — ABNORMAL HIGH (ref 8–23)
CO2: 23 mmol/L (ref 22–32)
Calcium: 8.6 mg/dL — ABNORMAL LOW (ref 8.9–10.3)
Chloride: 89 mmol/L — ABNORMAL LOW (ref 98–111)
Creatinine, Ser: 1.65 mg/dL — ABNORMAL HIGH (ref 0.61–1.24)
GFR calc Af Amer: 51 mL/min — ABNORMAL LOW (ref >60)
GFR calc non Af Amer: 44 mL/min — ABNORMAL LOW (ref >60)
Glucose, Bld: 104 mg/dL — ABNORMAL HIGH (ref 70–99)
Potassium: 4.4 mmol/L (ref 3.5–5.1)
Sodium: 126 mmol/L — ABNORMAL LOW (ref 135–145)
Total Bilirubin: 0.8 mg/dL (ref 0.3–1.2)
Total Protein: 7.5 g/dL (ref 6.5–8.1)

## 2019-06-26 LAB — CBC
HCT: 28.2 % — ABNORMAL LOW (ref 39.0–52.0)
Hemoglobin: 9.5 g/dL — ABNORMAL LOW (ref 13.0–17.0)
MCH: 34.4 pg — ABNORMAL HIGH (ref 26.0–34.0)
MCHC: 33.7 g/dL (ref 30.0–36.0)
MCV: 102.2 fL — ABNORMAL HIGH (ref 80.0–100.0)
Platelets: 122 10*3/uL — ABNORMAL LOW (ref 150–400)
RBC: 2.76 MIL/uL — ABNORMAL LOW (ref 4.22–5.81)
RDW: 13.2 % (ref 11.5–15.5)
WBC: 6.9 10*3/uL (ref 4.0–10.5)
nRBC: 0 % (ref 0.0–0.2)

## 2019-06-26 LAB — HEPATITIS PANEL, ACUTE
HCV Ab: NONREACTIVE
Hep A IgM: NONREACTIVE
Hep B C IgM: NONREACTIVE
Hepatitis B Surface Ag: NONREACTIVE

## 2019-06-26 LAB — OSMOLALITY: Osmolality: 279 mOsm/kg (ref 275–295)

## 2019-06-26 LAB — HEMOGLOBIN A1C
Hgb A1c MFr Bld: 5.7 % — ABNORMAL HIGH (ref 4.8–5.6)
Mean Plasma Glucose: 116.89 mg/dL

## 2019-06-26 LAB — HIV ANTIBODY (ROUTINE TESTING W REFLEX): HIV Screen 4th Generation wRfx: NONREACTIVE

## 2019-06-26 MED ORDER — CYANOCOBALAMIN 1000 MCG/ML IJ SOLN
1000.0000 ug | Freq: Every day | INTRAMUSCULAR | Status: AC
  Administered 2019-06-27 – 2019-06-28 (×2): 1000 ug via INTRAMUSCULAR
  Filled 2019-06-26 (×2): qty 1

## 2019-06-26 MED ORDER — VANCOMYCIN HCL 750 MG/150ML IV SOLN: 750.0000 mg | Freq: Two times a day (BID) | INTRAVENOUS | Status: DC

## 2019-06-26 MED ORDER — CYANOCOBALAMIN 1000 MCG/ML IJ SOLN
1000.0000 ug | Freq: Once | INTRAMUSCULAR | Status: AC
  Administered 2019-06-26: 1000 ug via INTRAMUSCULAR
  Filled 2019-06-26 (×2): qty 1

## 2019-06-26 MED ORDER — PERFLUTREN LIPID MICROSPHERE
1.0000 mL | INTRAVENOUS | Status: AC | PRN
  Administered 2019-06-26: 5 mL via INTRAVENOUS
  Filled 2019-06-26: qty 10

## 2019-06-26 MED ORDER — SODIUM CHLORIDE 0.9 % IV SOLN
2.0000 g | INTRAVENOUS | Status: DC
  Administered 2019-06-26 – 2019-06-28 (×13): 2 g via INTRAVENOUS
  Filled 2019-06-26 (×4): qty 2
  Filled 2019-06-26: qty 2000
  Filled 2019-06-26 (×5): qty 2
  Filled 2019-06-26: qty 2000
  Filled 2019-06-26: qty 2
  Filled 2019-06-26: qty 2000
  Filled 2019-06-26 (×2): qty 2

## 2019-06-26 MED ORDER — VANCOMYCIN HCL 2000 MG/400ML IV SOLN
2000.0000 mg | INTRAVENOUS | Status: DC
  Filled 2019-06-26: qty 400

## 2019-06-26 MED ORDER — SODIUM CHLORIDE 0.9 % IV SOLN: INTRAVENOUS | Status: DC

## 2019-06-26 MED ORDER — SODIUM CHLORIDE 0.9 % IV SOLN
2.0000 g | Freq: Four times a day (QID) | INTRAVENOUS | Status: DC
  Filled 2019-06-26: qty 2000

## 2019-06-26 MED ORDER — SODIUM CHLORIDE 0.9 % IV SOLN
2.0000 g | INTRAVENOUS | Status: DC
  Filled 2019-06-26 (×2): qty 2000

## 2019-06-26 MED ORDER — ACETAMINOPHEN 325 MG PO TABS
650.0000 mg | ORAL_TABLET | Freq: Four times a day (QID) | ORAL | Status: DC | PRN
  Administered 2019-06-26 – 2019-06-29 (×7): 650 mg via ORAL
  Filled 2019-06-26 (×7): qty 2

## 2019-06-26 NOTE — Progress Notes (Addendum)
   [facility] has been requested to perform a transesophageal echocardiogram on ths patient for bacteremia. I spoke with the patient who requested I speak with with his wife regarding procedure and to obtain consent. His wife is Dr. Margaretha Glassing, an OB-GYN. She is also on PA program faculty at [employer].  After careful review of history and examination, the risks and benefits of transesophageal echocardiogram have been explained including risks of esophageal damage, perforation (1:10,000 risk), bleeding, pharyngeal hematoma as well as other potential complications associated with anesthesia including aspiration, arrhythmia, respiratory failure and death. Alternatives of treatment were discussed, questions were answered. They are willing to proceed. This is scheduled for Monday with Dr. Acie Fredrickson at 12:30pm with anesthesia. Orders written including to keep NPO after midnight Monday.  [name], PA-C 06/26/2019 11:09 AM

## 2019-06-26 NOTE — ED Notes (Signed)
Report given to Kim RN.

## 2019-06-26 NOTE — Evaluation (Signed)
Occupational Therapy Evaluation Patient Details Name: Louis [name] MRN: [id number] DOB: [birth year] Today's Date: 06/26/2019    History of Present Illness ERYN [name] is a 64 y.o. male with a history of scoliosis with restrictive and obstructive lung disease, chronic HFpEF, OSA and nocturnal hypoxia on CPAP and 2L O2, essential tremor, hepatic steatosis, and perforated duodenal ulcer s/p Phillip [name] patch Jan 2021 who presented to the ED with increasing falls and progressive weakness. He was found to be febrile to 101.61F and tachypneic though not hypoxemic. Work up included CXR with indistinct hazy left-sided opacity and subsequent CTA chest after d-dimer was 1.90 which had motion degradation without PE or focal consolidation. There were incidental findings including renal lesions for which abdominal MR was recommended.   Clinical Impression   [name] is a 64 year old man who presents with functional strength of upper extremities but overall generalized weakness, poor activity tolerance, decreased coordination and ataxic movement of upper and lower extremities, lower extremity peripheral neuropathy, confusion, and recent metatarsal fracture of right foot resulting in significant decline in ability to perform transfers, mobility and self care tasks. Evaluation limited to bed mobility secondary to patient's confusion, ataxic movement and inability to come into sitting. Patient will benefit from skilled OT services to improve deficits and return to PLOF. [name] spouse present and reports patient typically independent and working as a Environmental education officer. Spouse interested in patient going to CIR for aggressive therapy at discharge.    Follow Up Recommendations  CIR;SNF    Equipment Recommendations   (TBD)    Recommendations for Other Services       Precautions / Restrictions Precautions Precautions: Fall Restrictions Other Position/Activity Restrictions: WBAT in steel insert shoes per wife       Mobility Bed Mobility Overal bed mobility: Needs Assistance Bed Mobility: Rolling;Supine to Sit;Sit to Supine Rolling: Max assist   Supine to sit: Max assist Sit to supine: Max assist   General bed mobility comments: Patient max assist to roll to the right. Attempted supine to sit but unable to achieve full transfer as patient's legs ataxic and patient not motor planning movement well. Returned to supine for safety and patient reporting the need for bowel movement.  Transfers                 General transfer comment: unable to attempt standing at this time.    Balance                                           ADL either performed or assessed with clinical judgement   ADL Overall ADL's : Needs assistance/impaired Eating/Feeding: Set up;Bed level   Grooming: Set up;Bed level   Upper Body Bathing: Minimal assistance;Bed level;Set up   Lower Body Bathing: Total assistance;Bed level   Upper Body Dressing : Moderate assistance;Bed level   Lower Body Dressing: Total assistance;+2 for physical assistance;Bed level     Toilet Transfer Details (indicate cue type and reason): n/a Toileting- Clothing Manipulation and Hygiene: Total assistance;+2 for physical assistance;Bed level Toileting - Clothing Manipulation Details (indicate cue type and reason): Patient placed on bed pan.   Tub/Shower Transfer Details (indicate cue type and reason): n/a         Vision Baseline Vision/History: Wears glasses Wears Glasses: At all times       Perception     Praxis  Pertinent Vitals/Pain Pain Assessment: No/denies pain     Hand Dominance Right   Extremity/Trunk Assessment Upper Extremity Assessment Upper Extremity Assessment: RUE deficits/detail;LUE deficits/detail RUE Deficits / Details: RUE 5/5 strength except 3+/5 grip strength, tremulous upper extremity and ataxic movement with finger to nose. RUE Coordination: decreased gross motor;decreased  fine motor LUE Deficits / Details: Shoulder strength 4/5, 5/5 elbow, 3+/5 grip strength, tremulous, ataxic LUE Coordination: decreased gross motor;decreased fine motor   Lower Extremity Assessment Lower Extremity Assessment: Defer to PT evaluation   Cervical / Trunk Assessment Cervical / Trunk Assessment: Normal   Communication Communication Communication: No difficulties   Cognition Arousal/Alertness: Awake/alert Behavior During Therapy: Restless Overall Cognitive Status: Impaired/Different from baseline Area of Impairment: Orientation;Following commands;Safety/judgement                       Following Commands: Follows one step commands with increased time Safety/Judgement: Decreased awareness of deficits;Decreased awareness of safety         General Comments       Exercises     Shoulder Instructions      Home Living Family/patient expects to be discharged to:: Inpatient rehab Living Arrangements: Spouse/significant other                                      Prior Functioning/Environment Level of Independence: Independent with assistive device(s)        Comments: Patient was doing well until fall 2 weeks resulting in 5th metatarsal fracture. Per spouse patient WBAT with carbon steel insert shoes. Patient had been able to traverse stairs and independent with ADLs. 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        OT Problem List: Decreased activity tolerance;Decreased safety awareness;Decreased cognition;Decreased strength;Impaired UE functional use;Decreased coordination      OT Treatment/Interventions: Self-care/ADL training;Therapeutic exercise;Neuromuscular education;DME and/or AE instruction;Cognitive remediation/compensation;Therapeutic activities;Patient/family education;Balance training    OT Goals(Current goals can be found in the care plan section) Acute Rehab OT Goals Patient Stated Goal: To go to bathroom OT Goal Formulation: With  patient Time For Goal Achievement: 07/09/19 Potential to Achieve Goals: Good  OT Frequency: Min 3X/week   Barriers to D/C: Inaccessible home environment;Decreased caregiver support          Co-evaluation              AM-PAC OT "6 Clicks" Daily Activity     Outcome Measure Help from another person eating meals?: A Little Help from another person taking care of personal grooming?: A Little Help from another person toileting, which includes using toliet, bedpan, or urinal?: Total Help from another person bathing (including washing, rinsing, drying)?: A Lot Help from another person to put on and taking off regular upper body clothing?: A Lot Help from another person to put on and taking off regular lower body clothing?: Total 6 Click Score: 12   End of Session    Activity Tolerance: Patient limited by fatigue Patient left: in bed;with call bell/phone within reach;with bed alarm set  OT Visit Diagnosis: Muscle weakness (generalized) (M62.81)                Time: 2297-9892 OT Time Calculation (min): 16 min Charges:  OT General Charges $OT Visit: 1 Visit OT Evaluation $OT Eval Moderate Complexity: 1 Mod  Mieko Kneebone, OTR/L [place]  Office [phone] Pager: [phone]   Lenward Chancellor 06/26/2019,  5:03 PM

## 2019-06-26 NOTE — Plan of Care (Signed)
  Problem: Clinical Measurements: Goal: Will remain free from infection Outcome: Progressing   Problem: Clinical Measurements: Goal: Will remain free from infection Outcome: Progressing   Problem: Clinical Measurements: Goal: Diagnostic test results will improve Outcome: Progressing

## 2019-06-26 NOTE — Progress Notes (Signed)
PROGRESS NOTE  Louis [name]  ZOX:[id number] DOB: 08/11/1955 DOA: 06/25/2019 PCP: Rutherford Guys, MD  Outpatient Specialists: GI, Dr. [name]; Cardiology, Dr. Georgina Peer; Pulmonology, [name]  Brief Narrative: Louis [name] is a 64 y.o. male with a history of scoliosis with restrictive and obstructive lung disease, chronic HFpEF, OSA and nocturnal hypoxia on CPAP and 2L O2, essential tremor, hepatic steatosis, and perforated duodenal ulcer s/p [name] Jan 2021 who presented to the ED with increasing falls and progressive weakness. He was found to be febrile to 101.55F and tachypneic though not hypoxemic. Work up included CXR with indistinct hazy left-sided opacity and subsequent CTA chest after d-dimer was 1.90 which had motion degradation without PE or focal consolidation. There were incidental findings including renal lesions for which abdominal MR was recommended. Multiple metabolic derangements including hyponatremia (125), hyperkalemia (5.8) acute renal failure (SCr 1.99 from baseline of 1), and mild LFT elevations. BNP 229.1 and troponin negative. Hgb 10.4g/dl, and platelets 133. Urine micro showed many bacteria and hyaline casts without RBCs or WBCs. Ceftriaxone and 500cc NS given, blood and urine cultures sent, neurology consulted, and hospitalists consulted for admission.  Blood cultures (2 of 4 bottles) have grown Enterococcus for which ampicillin is started, ID consulted, and TEE is planned. To evaluate renal lesion and r/o intraabdominal abscess, abdominal MRI is ordered.  Assessment & Plan: Active Problems:   HTN (hypertension)   Anxiety state   Essential tremor   Renal artery stenosis in 1 of 2 vessels (HCC)   Transaminitis   SOB (shortness of breath)   Chronic respiratory failure with hypoxia and hypercapnia (HCC)   OSA (obstructive sleep apnea)   Hyperkalemia   Hyponatremia   ARF (acute renal failure) (HCC)  Acute renal failure: Prerenal by FENa (.41%) with hyaline casts.  Subnephrotic proteinuria nonspecific. - Continue gentle IVF, no overload developing. SCr improving.  - Trend BMP, avoid nephrotoxins.  Enterococcus bacteremia: Unclear source (UTI w/bacteriuria w/o pyuria vs. occult intraabdominal abscess).  - Discussed with ID, Dr. Tommy Medal. Ampicillin started, echo requested with plans for TEE if negative already arranged for 6/28.  - Repeat cultures tomorrow (24 hrs after abx started).  - MR abdomen w/ and w/o   Hyperkalemia: Recurrent problem, resolved.  - Will hold spironolactone and ARB. Would be in favor of switching back to lasix from spironolactone, though will defer to cardiology as outpatient.  - Continue cardiac monitoring.   Hyponatremia: Recurrent problem/chronic, suspect hypovolemic, possibly exacerbated by EtOH.  - Will correct slowly, slight improvement thus far. Give NS at 75cc/hr.  - May need to institute fluid restriction  Chronic HFpEF (LVEF wnl, G2DD), longstanding resistant HTN: BNP elevated but lower than previous value and does not appear overloaded by exam. No effusions or airspace opacities on CTA chest.  - Hold spironolactone and ARB - Continue metoprolol and norvasc.  - Monitor daily weights, I/O  Frequent falls, progressive generalized weakness:  - Neurology recommendations appreciated, CT and MRI brain unrevealing. - PT/OT consulted.   Essential tremor: Worsening with ataxic features and increasing falls. ?if worsened with gabapentin in setting of renal failure as this has improved with improvement in CrCl. - Due to presence of peripheral neuropathy, checking vitamin B12 (low normal), ceruloplasmin, thiamine, A1c (5.7%). Supplementing thiamine empirically and B12.  LFT elevation and hepatic steatosis:  - Alcohol moderation counseling provided, will attempt to avoid tylenol, though NSAIDs also not an option w/duodenal ulcer hx.  - With concomitant diarrhea, hepatitis panel checked and negative.  Will continue  monitoring.  Exophytic high-attenuation left upper pole renal lesion (1cm): Seen incidentally on CTA chest. - Abdominal MRI.   OSA, chronic nocturnal hypoxemic respiratory failure, restrictive lung disease:  - Continue CPAP with 2L O2 qHS.   History of perforated duodenal ulcer s/p [name] Jan 2021: Subsequent EGD confirmed resolution of duodenal ulceration. H. pylori has been negative.  - Continue lifelong PPI.  - Avoid NSAIDs  Chronic hyperchromic anemia: Unclear etiology, possible AOCD with high ferritin (though likely acting as acute phase reactant) and low TIBC - Supp B12 and monitor  Thrombocytopenia: ?if this is due to hepatic steatosis vs. early sepsis.  - Continue monitoring.   Alcohol overuse: No history of withdrawal and is drinking less alcohol than previously.  - Alcohol moderation counseling performed.  - No current evidence of withdrawal.  Rounded 8 mm area of lucency within the C3 vertebral body and 9 mm sclerotic lesion within the C6 vertebral body: Incidental findings on cervical spine CT at admission. This is nonspecific.  - Will plan nonemergent nuclear medicine bone scan  Aortic atherosclerosis: Noted.   Anxiety:  - Continue prn xanax as above.   History of left foot fractures s/p ORIF and recent closed nondisplaced fracture of the base of the 5th metatarsal. - WBAT in CAM boot, PT/OT  DVT prophylaxis: Heparin [place] Code Status: Full Family Communication: Wife by speaker phone x2 this AM Disposition Plan:  Status is: Inpatient  Remains inpatient appropriate because:Ongoing diagnostic testing needed not appropriate for outpatient work up and IV treatments appropriate due to intensity of illness or inability to take PO  Dispo: The patient is from: Home              Anticipated d/c is to: TBD              Anticipated d/c date is: > 3 days              Patient currently is not medically stable to d/c.  Consultants:   ID  Cardiology for  TEE  Procedures:   TEE planned 6/28  Antimicrobials:  Ceftriaxone 6/24  Ampicillin 6/25   Subjective: Slept well last night, no new complaints. Used CPAP just fine. Still in ED this AM waiting for bed. Denies fever, chills, sweats, abdominal pain, hematuria, chest pain, dyspnea, palpitations.  Objective: Vitals:   06/26/19 0300 06/26/19 0500 06/26/19 1001 06/26/19 1211  BP: 126/72 (!) 150/81 (!) 157/83 121/73  Pulse: 80 97 (!) 110 92  Resp: (!) 24 (!) 21 (!) 22 20  Temp:   (!) 100.4 F (38 C) 99.2 F (37.3 C)  TempSrc:      SpO2: 100% 98% 95% 94%    Intake/Output Summary (Last 24 hours) at 06/26/2019 1549 Last data filed at 06/26/2019 1400 Gross per 24 hour  Intake 1204.52 ml  Output 700 ml  Net 504.52 ml   There were no vitals filed for this visit.  Gen: 64 y.o. male in no distress Pulm: Non-labored breathing room air. Clear to auscultation bilaterally.  CV: Regular rate and rhythm. No murmur, rub, or gallop. No JVD, no significant pedal edema. GI: Abdomen soft, non-tender, non-distended, with normoactive bowel sounds. No organomegaly or masses felt. Ext: Warm, no deformities Skin: No new rashes, lesions or ulcers Neuro: Alert and oriented, impaired short term recall. Intention tremor is less than yesterday. Stable decreased vibratory sensation in feet. No new focal neurological deficits. Psych: Judgement and insight appear fair. Mood & affect appropriate.  Data Reviewed: I have personally reviewed following labs and imaging studies  CBC: Recent Labs  Lab 06/25/19 1144 06/26/19 0620  WBC 6.1 6.9  NEUTROABS 5.5  --   HGB 10.4* 9.5*  HCT 30.0* 28.2*  MCV 99.7 102.2*  PLT 133* 161*   Basic Metabolic Panel: Recent Labs  Lab 06/25/19 1335 06/26/19 0620  NA 125* 126*  K 5.8* 4.4  CL 87* 89*  CO2 27 23  GLUCOSE 130* 104*  BUN 33* 29*  CREATININE 1.99* 1.65*  CALCIUM 9.1 8.6*   GFR: Estimated Creatinine Clearance: 55.9 mL/min (A) (by C-G formula  based on SCr of 1.65 mg/dL (H)). Liver Function Tests: Recent Labs  Lab 06/25/19 1335 06/26/19 0620  AST 68* 268*  ALT 57* 116*  ALKPHOS 90 70  BILITOT 0.8 0.8  PROT 7.7 7.5  ALBUMIN 4.0 3.8   No results for input(s): LIPASE, AMYLASE in the last 168 hours. Recent Labs  Lab 06/25/19 2122  AMMONIA 25   Coagulation Profile: No results for input(s): INR, PROTIME in the last 168 hours. Cardiac Enzymes: Recent Labs  Lab 06/25/19 1335  CKTOTAL 344   BNP (last 3 results) No results for input(s): PROBNP in the last 8760 hours. HbA1C: Recent Labs    06/26/19 0620  HGBA1C 5.7*   CBG: Recent Labs  Lab 06/25/19 1226  GLUCAP 140*   Lipid Profile: No results for input(s): CHOL, HDL, LDLCALC, TRIG, CHOLHDL, LDLDIRECT in the last 72 hours. Thyroid Function Tests: No results for input(s): TSH, T4TOTAL, FREET4, T3FREE, THYROIDAB in the last 72 hours. Anemia Panel: Recent Labs    06/25/19 2122  VITAMINB12 237  FOLATE 8.4  FERRITIN 2,333*  TIBC 209*  IRON 30*  RETICCTPCT 1.4   Urine analysis:    Component Value Date/Time   COLORURINE YELLOW 06/25/2019 1144   APPEARANCEUR CLEAR 06/25/2019 1144   APPEARANCEUR Clear 02/27/2017 1555   LABSPEC 1.011 06/25/2019 1144   PHURINE 5.0 06/25/2019 1144   GLUCOSEU >=500 (A) 06/25/2019 1144   HGBUR SMALL (A) 06/25/2019 1144   BILIRUBINUR NEGATIVE 06/25/2019 1144   BILIRUBINUR Negative 02/27/2017 1555   KETONESUR 5 (A) 06/25/2019 1144   PROTEINUR NEGATIVE 06/25/2019 1144   NITRITE NEGATIVE 06/25/2019 1144   LEUKOCYTESUR NEGATIVE 06/25/2019 1144   Recent Results (from the past 240 hour(s))  Culture, blood (routine x 2)     Status: None (Preliminary result)   Collection Time: 06/25/19  1:08 PM   Specimen: BLOOD  Result Value Ref Range Status   Specimen Description   Final    BLOOD RIGHT ANTECUBITAL Performed at [facility], [place] [address] [place], [place] [address]    Special Requests   Final    BOTTLES DRAWN  AEROBIC AND ANAEROBIC Blood Culture adequate volume Performed at [facility] [address] [place], [place] [address]    Culture  Setup Time   Final    GRAM POSITIVE COCCI IN BOTH AEROBIC AND ANAEROBIC BOTTLES Organism ID to follow CRITICAL RESULT CALLED TO, READ BACK BY AND VERIFIED WITH: M. RENZ PHARMD, AT 1191 06/26/19 BY Rush Landmark Performed at [facility], [place] [address] [place], [place] [address]    Culture GRAM POSITIVE COCCI  Final   Report Status PENDING  Incomplete  SARS Coronavirus 2 by RT PCR (hospital order, performed in [facility] hospital lab) Nasopharyngeal Nasopharyngeal Swab     Status: None   Collection Time: 06/25/19  1:08 PM   Specimen: Nasopharyngeal Swab  Result Value Ref Range Status   SARS Coronavirus 2 NEGATIVE NEGATIVE Final    Comment: (NOTE) SARS-CoV-2 target nucleic acids are NOT DETECTED.  The SARS-CoV-2 RNA is generally detectable in upper and lower respiratory specimens during the acute phase of infection. The lowest concentration of SARS-CoV-2 viral copies this assay can detect is 250 copies / mL. A negative result does not preclude SARS-CoV-2 infection and should not be used as the sole basis for treatment or other patient management decisions.  A negative result may occur with improper specimen collection / handling, submission of specimen other than nasopharyngeal swab, presence of viral mutation(s) within the areas targeted by this assay, and inadequate number of viral copies (<250 copies / mL). A negative result must be combined with clinical observations, patient history, and epidemiological information.  Fact Sheet for Patients:   StrictlyIdeas.no  Fact Sheet for Healthcare Providers: BankingDealers.co.za  This test is not yet approved or  cleared by the [place] FDA and has been authorized for detection and/or diagnosis of SARS-CoV-2 by FDA under an  Emergency Use Authorization (EUA).  This EUA will remain in effect (meaning this test can be used) for the duration of the COVID-19 declaration under Section 564(b)(1) of the Act, 21 U.S.C. section 360bbb-3(b)(1), unless the authorization is terminated or revoked sooner.  Performed at [facility], [place] [address], [place], [place] [address]   Blood Culture ID Panel (Reflexed)     Status: Abnormal   Collection Time: 06/25/19  1:08 PM  Result Value Ref Range Status   Enterococcus species DETECTED (A) NOT DETECTED Final    Comment: CRITICAL RESULT CALLED TO, READ BACK BY AND VERIFIED WITH: M. RENZ PHARMD, AT [id number] 06/26/19 BY D. VANHOOK    Vancomycin resistance NOT DETECTED NOT DETECTED Final   Listeria monocytogenes NOT DETECTED NOT DETECTED Final   Staphylococcus species NOT DETECTED NOT DETECTED Final   Staphylococcus aureus (BCID) NOT DETECTED NOT DETECTED Final   Streptococcus species NOT DETECTED NOT DETECTED Final   Streptococcus agalactiae NOT DETECTED NOT DETECTED Final   Streptococcus pneumoniae NOT DETECTED NOT DETECTED Final   Streptococcus pyogenes NOT DETECTED NOT DETECTED Final   Acinetobacter baumannii NOT DETECTED NOT DETECTED Final   Enterobacteriaceae species NOT DETECTED NOT DETECTED Final   Enterobacter cloacae complex NOT DETECTED NOT DETECTED Final   Escherichia coli NOT DETECTED NOT DETECTED Final   Klebsiella oxytoca NOT DETECTED NOT DETECTED Final   Klebsiella pneumoniae NOT DETECTED NOT DETECTED Final   Proteus species NOT DETECTED NOT DETECTED Final   Serratia marcescens NOT DETECTED NOT DETECTED Final   Haemophilus influenzae NOT DETECTED NOT DETECTED Final   Neisseria meningitidis NOT DETECTED NOT DETECTED Final   Pseudomonas aeruginosa NOT DETECTED NOT DETECTED Final   Candida albicans NOT DETECTED NOT DETECTED Final   Candida glabrata NOT DETECTED NOT DETECTED Final   Candida krusei NOT DETECTED NOT DETECTED Final   Candida  parapsilosis NOT DETECTED NOT DETECTED Final   Candida tropicalis NOT DETECTED NOT DETECTED Final    Comment: Performed at [facility], [place] [address] [place], [place] [address]  Culture, blood (routine x 2)     Status: None (Preliminary result)   Collection Time: 06/25/19  1:52 PM   Specimen: BLOOD LEFT HAND  Result Value Ref Range Status   Specimen Description   Final    BLOOD LEFT HAND Performed at [facility] [address], [place], [place] [address]    Special Requests  Final    BOTTLES DRAWN AEROBIC AND ANAEROBIC Blood Culture adequate volume Performed at [facility] [address], [place], [place] [address]    Culture   Final    NO GROWTH < 24 HOURS Performed at [facility] [address] [place], [place] [address]    Report Status PENDING  Incomplete      Radiology Studies: CT Head Wo Contrast  Result Date: 06/25/2019 CLINICAL DATA:  Frequent falls EXAM: CT HEAD WITHOUT CONTRAST TECHNIQUE: Contiguous axial images were obtained from the base of the skull through the vertex without intravenous contrast. COMPARISON:  None. FINDINGS: Brain: There is no acute intracranial hemorrhage, mass effect, or edema. Gray-white differentiation is preserved. There is no extra-axial fluid collection. Prominence of the ventricles and sulci reflects mild generalized parenchymal volume loss. Vascular: There is mild atherosclerotic calcification at the skull base. Skull: Calvarium is unremarkable. Sinuses/Orbits: No acute finding. Other: None. IMPRESSION: No acute intracranial hemorrhage, mass effect, or evidence of acute infarction. Electronically Signed   By: Macy Mis M.D.   On: 06/25/2019 14:43   CT Angio Chest PE W/Cm &/Or Wo Cm  Result Date: 06/25/2019 CLINICAL DATA:  64 year old male with history of shortness of breath. EXAM: CT ANGIOGRAPHY CHEST WITH CONTRAST TECHNIQUE: Multidetector CT imaging of the chest was performed using  the standard protocol during bolus administration of intravenous contrast. Multiplanar CT image reconstructions and MIPs were obtained to evaluate the vascular anatomy. CONTRAST:  90mL OMNIPAQUE IOHEXOL 350 MG/ML SOLN COMPARISON:  Chest CTA 07/02/2017. FINDINGS: Cardiovascular: Today's study is significantly limited by large amount of patient respiratory motion. With this limitation in mind, there is no definite central, lobar or segmental sized pulmonary embolism. Smaller subsegmental sized pulmonary emboli cannot be completely excluded given the limitations of today's examination. Heart size is normal. There is no significant pericardial fluid, thickening or pericardial calcification. Aortic atherosclerosis. No definite coronary artery calcifications. Mediastinum/Nodes: No pathologically enlarged mediastinal or hilar lymph nodes. Esophagus is unremarkable in appearance. No axillary lymphadenopathy. Lungs/Pleura: No suspicious appearing pulmonary nodules or masses are noted. No acute consolidative airspace disease. No pleural effusions. Scattered areas of mild linear scarring in the lung bases bilaterally. Upper Abdomen: Severe diffuse low attenuation throughout the hepatic parenchyma, indicative of severe hepatic steatosis. 1 cm exophytic high attenuation lesion (58 HU) lesion in the upper pole the left kidney, incompletely characterized. 4.5 cm low-attenuation lesion in the upper pole the left kidney, compatible with a simple cyst. Small low-attenuation lesion incompletely imaged in the upper pole the right kidney, also likely to represent a small cyst. Musculoskeletal: Severe dextroscoliosis of the thoracolumbar spine. There are no aggressive appearing lytic or blastic lesions noted in the visualized portions of the skeleton. Review of the MIP images confirms the above findings. IMPRESSION: 1. Limited examination secondary to patient respiratory motion demonstrating no central, lobar or segmental sized pulmonary  embolism. 2. No acute findings are noted in the thorax to account for the patient's symptoms. 3. Aortic atherosclerosis. 4. Severe hepatic steatosis. 5. 1 cm exophytic high attenuation lesion in the upper pole the left kidney, incompletely characterized on today's examination. Follow-up evaluation with nonemergent abdominal MRI with and without IV gadolinium is strongly recommended in the near future to exclude the possibility of neoplasm. Aortic Atherosclerosis (ICD10-I70.0). Electronically Signed   By: Vinnie Langton M.D.   On: 06/25/2019 14:47   CT Cervical Spine Wo Contrast  Result Date: 06/25/2019 CLINICAL DATA:  Multiple falls EXAM: CT CERVICAL SPINE WITHOUT  CONTRAST TECHNIQUE: Multidetector CT imaging of the cervical spine was performed without intravenous contrast. Multiplanar CT image reconstructions were also generated. COMPARISON:  None. FINDINGS: Alignment: Facet joints aligned without dislocation. Dens and lateral masses aligned. No static listhesis. Skull base and vertebrae: No acute fracture. Rounded 8 mm area of lucency within the right posterolateral aspect of the C3 vertebral body (series 6, image 21). 9 mm sclerotic lesion within the right posterolateral aspect of the C6 vertebral body (series 5, image 21). Lesions are nonexpansile. Soft tissues and spinal canal: No prevertebral fluid or swelling. No visible canal hematoma. Disc levels: Intervertebral disc height loss at C3-4 through C5-6. No evidence of high-grade foraminal or canal stenosis by CT. Upper chest: Visualized lung apices are clear. Other: None. IMPRESSION: 1. No acute fracture or static listhesis of the cervical spine. 2. Mild multilevel degenerative changes of the cervical spine. 3. Rounded 8 mm area of lucency within the C3 vertebral body and 9 mm sclerotic lesion within the C6 vertebral body. Findings are nonspecific and may represent benign lesions such as hemangioma and bone island. However, if there is concern for  metastatic disease, a nonemergent nuclear medicine bone scan could be performed for further evaluation. Electronically Signed   By: Davina Poke D.O.   On: 06/25/2019 14:34   MR BRAIN WO CONTRAST  Result Date: 06/25/2019 CLINICAL DATA:  Stroke.  Weakness. EXAM: MRI HEAD WITHOUT CONTRAST TECHNIQUE: Multiplanar, multiecho pulse sequences of the brain and surrounding structures were obtained without intravenous contrast. COMPARISON:  CT head 06/25/2019 FINDINGS: Brain: Negative for acute infarct Moderate atrophy. Minimal white matter changes. Brainstem and cerebellum intact. Negative for hemorrhage or mass. No fluid collection or midline shift. Vascular: Normal arterial flow voids. Skull and upper cervical spine: Normal bone marrow Sinuses/Orbits: Paranasal sinuses clear. No orbital lesion. Other: None IMPRESSION: Negative for acute infarct Moderate atrophy.  No acute abnormality. Electronically Signed   By: Franchot Gallo M.D.   On: 06/25/2019 18:32   DG Chest Port 1 View  Result Date: 06/25/2019 CLINICAL DATA:  Shortness of breath. Additional provided: History of COPD, CHF, shortness of breath, hypertension. EXAM: PORTABLE CHEST 1 VIEW COMPARISON:  Chest radiograph 01/06/2019 FINDINGS: Unchanged cardiomegaly. Hazy opacity throughout the left mid to lower lung field. The right lung is grossly clear. No evidence of pneumothorax. No acute bony abnormality. Redemonstrated prominent thoracic dextrocurvature. Healed fracture of the posterior left eighth rib. IMPRESSION: Hazy opacity throughout the left mid to lower lung field. This may reflect pneumonia, atelectasis asymmetric edema or possibly a layering pleural effusion. The right lung is grossly clear. Unchanged cardiomegaly. Electronically Signed   By: Kellie Simmering DO   On: 06/25/2019 12:30    Scheduled Meds: . amLODipine  5 mg Oral Daily  . folic acid  1 mg Oral Daily  . gabapentin  300 mg Oral BID  . heparin  5,000 Units Subcutaneous Q8H  .  metoprolol succinate  150 mg Oral Daily  . multivitamin with minerals  1 tablet Oral Daily  . thiamine  100 mg Oral Daily   Continuous Infusions: . sodium chloride 75 mL/hr at 06/26/19 1019  . sodium chloride 20 mL/hr at 06/26/19 1219  . ampicillin (OMNIPEN) IV 2 g (06/26/19 1527)     LOS: 1 day   Time spent: 35 minutes.  Patrecia Pour, MD [facility] [email or web address] 06/26/2019, 3:49 PM

## 2019-06-26 NOTE — Progress Notes (Signed)
  Echocardiogram 2D Echocardiogram has been performed.  Louis [name] 06/26/2019, 3:23 PM

## 2019-06-26 NOTE — Progress Notes (Signed)
OT Cancellation Note  Patient Details Name: Louis [name] MRN: [id number] DOB: September 28, 1955   Cancelled Treatment:    Reason Eval/Treat Not Completed: Patient at procedure or test/ unavailable Attempted twice today to see patient and patient unavailable. Will f/u when able.  Adrion Menz L Lamari Beckles 06/26/2019, 3:37 PM

## 2019-06-26 NOTE — Progress Notes (Signed)
Please see addendum from Consult Note on 6/24.

## 2019-06-26 NOTE — Progress Notes (Signed)
PHARMACY - PHYSICIAN COMMUNICATION CRITICAL VALUE ALERT - BLOOD CULTURE IDENTIFICATION (BCID)  Louis [name] is an 64 y.o. male who presented to [facility] on 06/25/2019 with a chief complaint of worsening tremors and multiple falls.  Assessment:   Pt febrile on admission (101.30F).  He received Rocephin x1 dose in ED but work-up did not reveal obvious source of infection and antibiotics deferred on admission.   BCID + enterococcus, no Vanc resistance detected.   Name of physician (or Provider) Contacted: Bonner Puna  Current antibiotics: none  Changes to prescribed antibiotics recommended:  Start Vancomycin 2gm IV x1 now then 750mg  IV q12h for trough 15-20 Recommendations accepted by provider  Monitor renal function and cx data    Results for orders placed or performed during the hospital encounter of 06/25/19  Blood Culture ID Panel (Reflexed) (Collected: 06/25/2019  1:08 PM)  Result Value Ref Range   Enterococcus species DETECTED (A) NOT DETECTED   Vancomycin resistance NOT DETECTED NOT DETECTED   Listeria monocytogenes NOT DETECTED NOT DETECTED   Staphylococcus species NOT DETECTED NOT DETECTED   Staphylococcus aureus (BCID) NOT DETECTED NOT DETECTED   Streptococcus species NOT DETECTED NOT DETECTED   Streptococcus agalactiae NOT DETECTED NOT DETECTED   Streptococcus pneumoniae NOT DETECTED NOT DETECTED   Streptococcus pyogenes NOT DETECTED NOT DETECTED   Acinetobacter baumannii NOT DETECTED NOT DETECTED   Enterobacteriaceae species NOT DETECTED NOT DETECTED   Enterobacter cloacae complex NOT DETECTED NOT DETECTED   Escherichia coli NOT DETECTED NOT DETECTED   Klebsiella oxytoca NOT DETECTED NOT DETECTED   Klebsiella pneumoniae NOT DETECTED NOT DETECTED   Proteus species NOT DETECTED NOT DETECTED   Serratia marcescens NOT DETECTED NOT DETECTED   Haemophilus influenzae NOT DETECTED NOT DETECTED   Neisseria meningitidis NOT DETECTED NOT DETECTED   Pseudomonas aeruginosa NOT DETECTED  NOT DETECTED   Candida albicans NOT DETECTED NOT DETECTED   Candida glabrata NOT DETECTED NOT DETECTED   Candida krusei NOT DETECTED NOT DETECTED   Candida parapsilosis NOT DETECTED NOT DETECTED   Candida tropicalis NOT DETECTED NOT DETECTED    Netta Cedars PharmD, BCPS 06/26/2019  9:00 AM

## 2019-06-26 NOTE — TOC Progression Note (Signed)
Transition of Care [facility]) - Progression Note    Patient Details  Name: Louis [name] MRN: [id number] Date of Birth: 1955/07/15  Transition of [facility]) CM/SW Contact  Purcell Mouton, RN Phone Number: 06/26/2019, 3:15 PM  Clinical Narrative:    Pt from home with spouse. TOC continue to follow for discharge needs.    Expected Discharge Plan: Home/Self Care Barriers to Discharge: No Barriers Identified  Expected Discharge Plan and Services Expected Discharge Plan: Home/Self Care   Discharge Planning Services: CM Consult   Living arrangements for the past 2 months: Single Family Home                                       Social Determinants of Health (SDOH) Interventions    Readmission Risk Interventions No flowsheet data found.

## 2019-06-26 NOTE — Consult Note (Signed)
Date of Admission:  06/25/2019          Reason for Consult: Enterococcal bacteremia   Referring Provider: "Auto consult" and Dr. Bonner Puna   Assessment:   1.  Enterococcal bacteremia with septic shock and end organ damage  Including renal failure 2. History of perforated duodenum in January of 2021 3. History of loose stools for several months 4. HFpeF 5. OSA  Plan:  1. Narrow to ampicillin 2. Agree MRI abdomen (vs CT though not enthusiastic re IV contrast for CT right now 3. TTE and I have asked Trish to arrange for TEE at [facility] 4. Repeat blood cultures in  Am 5. Follow-up sensis on actual cultures  Dr. Baxter Flattery will check in on the patient this weekend.  Case discussed with the patient's wife Jailen Coward [phone] who is recently retired Ob/Gyn MD from [employer].  Active Problems:   HTN (hypertension)   Anxiety state   Essential tremor   Renal artery stenosis in 1 of 2 vessels (HCC)   Transaminitis   SOB (shortness of breath)   Chronic respiratory failure with hypoxia and hypercapnia (HCC)   OSA (obstructive sleep apnea)   Hyperkalemia   Hyponatremia   ARF (acute renal failure) (HCC)   Scheduled Meds: . amLODipine  5 mg Oral Daily  . folic acid  1 mg Oral Daily  . gabapentin  300 mg Oral BID  . heparin  5,000 Units Subcutaneous Q8H  . metoprolol succinate  150 mg Oral Daily  . multivitamin with minerals  1 tablet Oral Daily  . thiamine  100 mg Oral Daily   Continuous Infusions: . sodium chloride 75 mL/hr at 06/26/19 1019  . sodium chloride 20 mL/hr at 06/26/19 1219  . ampicillin (OMNIPEN) IV 2 g (06/26/19 1023)   PRN Meds:.acetaminophen, ALPRAZolam, ondansetron **OR** ondansetron (ZOFRAN) IV, traMADol  HPI: Louis [name] is a 64 y.o. male with multiple medical problems who has had duodenal perforation with sepsis in January requiring Exploratory laparatomy,abdominal washout, Graham patch repair of perforated duodenal ulcer. He had EGD done during that  admission which showed no leakage from repair site. He has also had colonoscopy that showed diverticulosis. He was admitted after increasing falls and progressive weakness. He was febrile to 101.7 in ER. He had reported SOB and CTA done which did not show PE but exophytic area in kidney. Admission labs were pertinent for ARF  With hyperkalemia and tranaminitis, TTpenia. UA had many bacteria but no pyuria.   He has been confused and Neurology were consulted and MRI of brain without focal deficit.  I am concerned for possible occult intraabdominal infection.  TTE ordered along with TEE     Review of Systems: Review of Systems  Unable to perform ROS: Mental status change    Past Medical History:  Diagnosis Date  . Anxiety    situational  . BPH (benign prostatic hyperplasia)    on flomax  . Bundle branch block    per prior PCP records  . CHF (congestive heart failure) ([place])   . COPD (chronic obstructive pulmonary disease) ([place])   . Diverticulosis    by colonoscopy  . Eczema    per prior PCP records  . Elevated PSA 2015   peaked 6s, s/p benign biopsy 2014, sees urology [name] ([place])  . Essential tremor   . GERD (gastroesophageal reflux disease)    per prior PCP records  . History of panic attacks    per prior PCP records  .  HTN (hypertension)   . Mild intermittent asthma in adult without complication   . Obesity, Class I, BMI 30-34.9   . Osteoporosis    DEXA 06/2013 with osteopenia - h/o ?wrist/hip fracture from AVN from chronic steroid use (asthma), took reclast for 1 year  . Renal artery stenosis in 1 of 2 vessels ([place]) 2011   by [place]  . Seasonal allergies   . Sleep apnea   . Thoracic scoliosis childhood  . Transaminitis    per prior PCP records    Social History   Tobacco Use  . Smoking status: Never Smoker  . Smokeless tobacco: Never Used  Vaping Use  . Vaping Use: Never used  Substance Use Topics  . Alcohol use: Yes    Alcohol/week: 14.0 standard drinks      Types: 14 Glasses of wine per week    Comment: Social  . Drug use: No    Family History  Problem Relation Age of Onset  . Cancer Father 27       colon  . Prostate cancer Father   . Diabetes Mother   . Alcohol abuse Mother   . Stroke Mother   . Cancer Maternal Grandmother        pancreatic  . Ataxia Brother   . Ataxia Sister   . CAD Neg Hx    Allergies  Allergen Reactions  . Accupril [Quinapril Hcl] Cough  . Nsaids     Perforated gastric ulcer    OBJECTIVE: Blood pressure 121/73, pulse 92, temperature 99.2 F (37.3 C), resp. rate 20, SpO2 94 %.  Physical Exam Constitutional:      Appearance: He is well-developed. He is obese.  HENT:     Head: Normocephalic and atraumatic.  Eyes:     Extraocular Movements: Extraocular movements intact.     Conjunctiva/sclera: Conjunctivae normal.  Cardiovascular:     Rate and Rhythm: Normal rate and regular rhythm.  Pulmonary:     Effort: Pulmonary effort is normal. No respiratory distress.     Breath sounds: Normal breath sounds. No stridor. No wheezing or rhonchi.  Abdominal:     General: Abdomen is flat. Bowel sounds are normal. There is no distension.     Palpations: Abdomen is soft. There is no mass.     Tenderness: There is no abdominal tenderness.  Musculoskeletal:        General: No tenderness. Normal range of motion.     Cervical back: Normal range of motion and neck supple.  Skin:    General: Skin is warm and dry.     Coloration: Skin is not pale.     Findings: No erythema or rash.  Neurological:     General: No focal deficit present.     Mental Status: He is alert.  Psychiatric:        Speech: Speech is delayed.        Behavior: Behavior is cooperative.        Cognition and Memory: Cognition is impaired. Memory is impaired.     Lab Results Lab Results  Component Value Date   WBC 6.9 06/26/2019   HGB 9.5 (L) 06/26/2019   HCT 28.2 (L) 06/26/2019   MCV 102.2 (H) 06/26/2019   PLT 122 (L) 06/26/2019     Lab Results  Component Value Date   CREATININE 1.65 (H) 06/26/2019   BUN 29 (H) 06/26/2019   NA 126 (L) 06/26/2019   K 4.4 06/26/2019   CL 89 (L) 06/26/2019   CO2  23 06/26/2019    Lab Results  Component Value Date   ALT 116 (H) 06/26/2019   AST 268 (H) 06/26/2019   ALKPHOS 70 06/26/2019   BILITOT 0.8 06/26/2019     Microbiology: Recent Results (from the past 240 hour(s))  Culture, blood (routine x 2)     Status: None (Preliminary result)   Collection Time: 06/25/19  1:08 PM   Specimen: BLOOD  Result Value Ref Range Status   Specimen Description   Final    BLOOD RIGHT ANTECUBITAL Performed at [facility], [place] [address] [place], [place] [address]    Special Requests   Final    BOTTLES DRAWN AEROBIC AND ANAEROBIC Blood Culture adequate volume Performed at [facility] [address], [place], [place] [address]    Culture  Setup Time   Final    GRAM POSITIVE COCCI IN BOTH AEROBIC AND ANAEROBIC BOTTLES Organism ID to follow CRITICAL RESULT CALLED TO, READ BACK BY AND VERIFIED WITH: M. RENZ PHARMD, AT [id number] 06/26/19 BY Rush Landmark Performed at [facility], [place] [address] [place], [place] [address]    Culture GRAM POSITIVE COCCI  Final   Report Status PENDING  Incomplete  SARS Coronavirus 2 by RT PCR (hospital order, performed in [facility] hospital lab) Nasopharyngeal Nasopharyngeal Swab     Status: None   Collection Time: 06/25/19  1:08 PM   Specimen: Nasopharyngeal Swab  Result Value Ref Range Status   SARS Coronavirus 2 NEGATIVE NEGATIVE Final    Comment: (NOTE) SARS-CoV-2 target nucleic acids are NOT DETECTED.  The SARS-CoV-2 RNA is generally detectable in upper and lower respiratory specimens during the acute phase of infection. The lowest concentration of SARS-CoV-2 viral copies this assay can detect is 250 copies / mL. A negative result does not preclude SARS-CoV-2 infection and should not be used as the sole basis for  treatment or other patient management decisions.  A negative result may occur with improper specimen collection / handling, submission of specimen other than nasopharyngeal swab, presence of viral mutation(s) within the areas targeted by this assay, and inadequate number of viral copies (<250 copies / mL). A negative result must be combined with clinical observations, patient history, and epidemiological information.  Fact Sheet for Patients:   StrictlyIdeas.no  Fact Sheet for Healthcare Providers: BankingDealers.co.za  This test is not yet approved or  cleared by the [place] FDA and has been authorized for detection and/or diagnosis of SARS-CoV-2 by FDA under an Emergency Use Authorization (EUA).  This EUA will remain in effect (meaning this test can be used) for the duration of the COVID-19 declaration under Section 564(b)(1) of the Act, 21 U.S.C. section 360bbb-3(b)(1), unless the authorization is terminated or revoked sooner.  Performed at [facility], [place] [address], [place],  [address]   Blood Culture ID Panel (Reflexed)     Status: Abnormal   Collection Time: 06/25/19  1:08 PM  Result Value Ref Range Status   Enterococcus species DETECTED (A) NOT DETECTED Final    Comment: CRITICAL RESULT CALLED TO, READ BACK BY AND VERIFIED WITH: M. RENZ PHARMD, AT 6203 06/26/19 BY D. VANHOOK    Vancomycin resistance NOT DETECTED NOT DETECTED Final   Listeria monocytogenes NOT DETECTED NOT DETECTED Final   Staphylococcus species NOT DETECTED NOT DETECTED Final   Staphylococcus aureus (BCID) NOT DETECTED NOT DETECTED Final   Streptococcus species NOT DETECTED NOT DETECTED Final   Streptococcus agalactiae NOT DETECTED NOT  DETECTED Final   Streptococcus pneumoniae NOT DETECTED NOT DETECTED Final   Streptococcus pyogenes NOT DETECTED NOT DETECTED Final   Acinetobacter baumannii NOT DETECTED NOT DETECTED Final    Enterobacteriaceae species NOT DETECTED NOT DETECTED Final   Enterobacter cloacae complex NOT DETECTED NOT DETECTED Final   Escherichia coli NOT DETECTED NOT DETECTED Final   Klebsiella oxytoca NOT DETECTED NOT DETECTED Final   Klebsiella pneumoniae NOT DETECTED NOT DETECTED Final   Proteus species NOT DETECTED NOT DETECTED Final   Serratia marcescens NOT DETECTED NOT DETECTED Final   Haemophilus influenzae NOT DETECTED NOT DETECTED Final   Neisseria meningitidis NOT DETECTED NOT DETECTED Final   Pseudomonas aeruginosa NOT DETECTED NOT DETECTED Final   Candida albicans NOT DETECTED NOT DETECTED Final   Candida glabrata NOT DETECTED NOT DETECTED Final   Candida krusei NOT DETECTED NOT DETECTED Final   Candida parapsilosis NOT DETECTED NOT DETECTED Final   Candida tropicalis NOT DETECTED NOT DETECTED Final    Comment: Performed at [facility], [place] [address] [place], [place] [address]    Alcide Evener, [place] [facility] [phone] pager  06/26/2019, 1:33 PM

## 2019-06-27 ENCOUNTER — Inpatient Hospital Stay (HOSPITAL_COMMUNITY): Payer: 59

## 2019-06-27 LAB — ECHOCARDIOGRAM COMPLETE

## 2019-06-27 LAB — CBC
HCT: 27.7 % — ABNORMAL LOW (ref 39.0–52.0)
Hemoglobin: 9.3 g/dL — ABNORMAL LOW (ref 13.0–17.0)
MCH: 34.8 pg — ABNORMAL HIGH (ref 26.0–34.0)
MCHC: 33.6 g/dL (ref 30.0–36.0)
MCV: 103.7 fL — ABNORMAL HIGH (ref 80.0–100.0)
Platelets: 118 10*3/uL — ABNORMAL LOW (ref 150–400)
RBC: 2.67 MIL/uL — ABNORMAL LOW (ref 4.22–5.81)
RDW: 13.3 % (ref 11.5–15.5)
WBC: 6.7 10*3/uL (ref 4.0–10.5)
nRBC: 0 % (ref 0.0–0.2)

## 2019-06-27 LAB — COMPREHENSIVE METABOLIC PANEL
ALT: 97 U/L — ABNORMAL HIGH (ref 0–44)
AST: 125 U/L — ABNORMAL HIGH (ref 15–41)
Albumin: 3.2 g/dL — ABNORMAL LOW (ref 3.5–5.0)
Alkaline Phosphatase: 50 U/L (ref 38–126)
Anion gap: 16 — ABNORMAL HIGH (ref 5–15)
BUN: 29 mg/dL — ABNORMAL HIGH (ref 8–23)
CO2: 22 mmol/L (ref 22–32)
Calcium: 8.2 mg/dL — ABNORMAL LOW (ref 8.9–10.3)
Chloride: 93 mmol/L — ABNORMAL LOW (ref 98–111)
Creatinine, Ser: 1.57 mg/dL — ABNORMAL HIGH (ref 0.61–1.24)
GFR calc Af Amer: 54 mL/min — ABNORMAL LOW (ref >60)
GFR calc non Af Amer: 47 mL/min — ABNORMAL LOW (ref >60)
Glucose, Bld: 109 mg/dL — ABNORMAL HIGH (ref 70–99)
Potassium: 4.3 mmol/L (ref 3.5–5.1)
Sodium: 131 mmol/L — ABNORMAL LOW (ref 135–145)
Total Bilirubin: 0.7 mg/dL (ref 0.3–1.2)
Total Protein: 7 g/dL (ref 6.5–8.1)

## 2019-06-27 MED ORDER — GADOBUTROL 1 MMOL/ML IV SOLN
10.0000 mL | Freq: Once | INTRAVENOUS | Status: AC | PRN
  Administered 2019-06-27: 10 mL via INTRAVENOUS

## 2019-06-27 NOTE — Progress Notes (Signed)
[facility]    Date of Admission:  06/25/2019   Total days of antibiotics 3        Day 2 ampicillin   ID: Louis [name] is a 64 y.o. male with enterococcal bacteremia Active Problems:   HTN (hypertension)   Anxiety state   Essential tremor   Renal artery stenosis in 1 of 2 vessels (HCC)   Transaminitis   SOB (shortness of breath)   Chronic respiratory failure with hypoxia and hypercapnia (HCC)   OSA (obstructive sleep apnea)   Hyperkalemia   Hyponatremia   ARF (acute renal failure) (HCC)   Bacteremia due to Enterococcus    Subjective: Fever up to 101.69F last night but feeling better than yesterday. His wife reports that he has clearer mentation. No lose stools overnight. ROS: numbness LE bilaterally  Medications:  . amLODipine  5 mg Oral Daily  . cyanocobalamin  1,000 mcg Intramuscular Daily  . folic acid  1 mg Oral Daily  . gabapentin  300 mg Oral BID  . heparin  5,000 Units Subcutaneous Q8H  . metoprolol succinate  150 mg Oral Daily  . multivitamin with minerals  1 tablet Oral Daily  . thiamine  100 mg Oral Daily    Objective: Vital signs in last 24 hours: Temp:  [99.2 F (37.3 C)-101.3 F (38.5 C)] 99.3 F (37.4 C) (06/26 0548) Pulse Rate:  [73-92] 87 (06/26 0929) Resp:  [20-28] 24 (06/26 0548) BP: (114-134)/(70-89) 134/84 (06/26 0929) SpO2:  [92 %-95 %] 93 % (06/26 0929) Weight:  [94.6 kg] 94.6 kg (06/26 0500) Physical Exam  Constitutional: He is oriented to person, place. He appears well-developed and well-nourished. No distress.  HENT:  Mouth/Throat: Oropharynx is clear and moist. No oropharyngeal exudate.  Cardiovascular: Normal rate, regular rhythm and normal heart sounds. Exam reveals no gallop and no friction rub.  No murmur heard.  Pulmonary/Chest: Effort normal and breath sounds normal. No respiratory distress. He has no wheezes.  Abdominal: Soft. Bowel sounds are decreased. + distension. There is no tenderness. Surgical  scar well healed Ext: no edema Neurological: He is alert and oriented to person, place, and time.  Skin: Skin is warm and dry. No rash noted. No erythema.  Psychiatric: He has a normal mood and affect. His behavior is normal.     Lab Results Recent Labs    06/25/19 1144 06/25/19 1335 06/26/19 0620  WBC 6.1  --  6.9  HGB 10.4*  --  9.5*  HCT 30.0*  --  28.2*  NA  --  125* 126*  K  --  5.8* 4.4  CL  --  87* 89*  CO2  --  27 23  BUN  --  33* 29*  CREATININE  --  1.99* 1.65*   Liver Panel Recent Labs    06/25/19 1335 06/26/19 0620  PROT 7.7 7.5  ALBUMIN 4.0 3.8  AST 68* 268*  ALT 57* 116*  ALKPHOS 90 70  BILITOT 0.8 0.8    Microbiology: 6/24 blood cx 1 of 2 sets + e.faecalis Studies/Results: CT Head Wo Contrast  Result Date: 06/25/2019 CLINICAL DATA:  Frequent falls EXAM: CT HEAD WITHOUT CONTRAST TECHNIQUE: Contiguous axial images were obtained from the base of the skull through the vertex without intravenous contrast. COMPARISON:  None. FINDINGS: Brain: There is no acute intracranial hemorrhage, mass effect, or edema. Gray-white differentiation is preserved. There is no extra-axial fluid collection. Prominence of the ventricles and sulci reflects mild generalized  parenchymal volume loss. Vascular: There is mild atherosclerotic calcification at the skull base. Skull: Calvarium is unremarkable. Sinuses/Orbits: No acute finding. Other: None. IMPRESSION: No acute intracranial hemorrhage, mass effect, or evidence of acute infarction. Electronically Signed   By: Macy Mis M.D.   On: 06/25/2019 14:43   CT Angio Chest PE W/Cm &/Or Wo Cm  Result Date: 06/25/2019 CLINICAL DATA:  64 year old male with history of shortness of breath. EXAM: CT ANGIOGRAPHY CHEST WITH CONTRAST TECHNIQUE: Multidetector CT imaging of the chest was performed using the standard protocol during bolus administration of intravenous contrast. Multiplanar CT image reconstructions and MIPs were obtained to  evaluate the vascular anatomy. CONTRAST:  29mL OMNIPAQUE IOHEXOL 350 MG/ML SOLN COMPARISON:  Chest CTA 07/02/2017. FINDINGS: Cardiovascular: Today's study is significantly limited by large amount of patient respiratory motion. With this limitation in mind, there is no definite central, lobar or segmental sized pulmonary embolism. Smaller subsegmental sized pulmonary emboli cannot be completely excluded given the limitations of today's examination. Heart size is normal. There is no significant pericardial fluid, thickening or pericardial calcification. Aortic atherosclerosis. No definite coronary artery calcifications. Mediastinum/Nodes: No pathologically enlarged mediastinal or hilar lymph nodes. Esophagus is unremarkable in appearance. No axillary lymphadenopathy. Lungs/Pleura: No suspicious appearing pulmonary nodules or masses are noted. No acute consolidative airspace disease. No pleural effusions. Scattered areas of mild linear scarring in the lung bases bilaterally. Upper Abdomen: Severe diffuse low attenuation throughout the hepatic parenchyma, indicative of severe hepatic steatosis. 1 cm exophytic high attenuation lesion (58 HU) lesion in the upper pole the left kidney, incompletely characterized. 4.5 cm low-attenuation lesion in the upper pole the left kidney, compatible with a simple cyst. Small low-attenuation lesion incompletely imaged in the upper pole the right kidney, also likely to represent a small cyst. Musculoskeletal: Severe dextroscoliosis of the thoracolumbar spine. There are no aggressive appearing lytic or blastic lesions noted in the visualized portions of the skeleton. Review of the MIP images confirms the above findings. IMPRESSION: 1. Limited examination secondary to patient respiratory motion demonstrating no central, lobar or segmental sized pulmonary embolism. 2. No acute findings are noted in the thorax to account for the patient's symptoms. 3. Aortic atherosclerosis. 4. Severe  hepatic steatosis. 5. 1 cm exophytic high attenuation lesion in the upper pole the left kidney, incompletely characterized on today's examination. Follow-up evaluation with nonemergent abdominal MRI with and without IV gadolinium is strongly recommended in the near future to exclude the possibility of neoplasm. Aortic Atherosclerosis (ICD10-I70.0). Electronically Signed   By: Vinnie Langton M.D.   On: 06/25/2019 14:47   CT Cervical Spine Wo Contrast  Result Date: 06/25/2019 CLINICAL DATA:  Multiple falls EXAM: CT CERVICAL SPINE WITHOUT CONTRAST TECHNIQUE: Multidetector CT imaging of the cervical spine was performed without intravenous contrast. Multiplanar CT image reconstructions were also generated. COMPARISON:  None. FINDINGS: Alignment: Facet joints aligned without dislocation. Dens and lateral masses aligned. No static listhesis. Skull base and vertebrae: No acute fracture. Rounded 8 mm area of lucency within the right posterolateral aspect of the C3 vertebral body (series 6, image 21). 9 mm sclerotic lesion within the right posterolateral aspect of the C6 vertebral body (series 5, image 21). Lesions are nonexpansile. Soft tissues and spinal canal: No prevertebral fluid or swelling. No visible canal hematoma. Disc levels: Intervertebral disc height loss at C3-4 through C5-6. No evidence of high-grade foraminal or canal stenosis by CT. Upper chest: Visualized lung apices are clear. Other: None. IMPRESSION: 1. No acute fracture or static listhesis of  the cervical spine. 2. Mild multilevel degenerative changes of the cervical spine. 3. Rounded 8 mm area of lucency within the C3 vertebral body and 9 mm sclerotic lesion within the C6 vertebral body. Findings are nonspecific and may represent benign lesions such as hemangioma and bone island. However, if there is concern for metastatic disease, a nonemergent nuclear medicine bone scan could be performed for further evaluation. Electronically Signed   By:  Davina Poke D.O.   On: 06/25/2019 14:34   MR BRAIN WO CONTRAST  Result Date: 06/25/2019 CLINICAL DATA:  Stroke.  Weakness. EXAM: MRI HEAD WITHOUT CONTRAST TECHNIQUE: Multiplanar, multiecho pulse sequences of the brain and surrounding structures were obtained without intravenous contrast. COMPARISON:  CT head 06/25/2019 FINDINGS: Brain: Negative for acute infarct Moderate atrophy. Minimal white matter changes. Brainstem and cerebellum intact. Negative for hemorrhage or mass. No fluid collection or midline shift. Vascular: Normal arterial flow voids. Skull and upper cervical spine: Normal bone marrow Sinuses/Orbits: Paranasal sinuses clear. No orbital lesion. Other: None IMPRESSION: Negative for acute infarct Moderate atrophy.  No acute abnormality. Electronically Signed   By: Franchot Gallo M.D.   On: 06/25/2019 18:32   MR ABDOMEN W WO CONTRAST  Result Date: 06/27/2019 CLINICAL DATA:  Indeterminate left renal lesion on recent chest CTA. Suspected abdominal infection or abscess. Renal insufficiency. EXAM: MRI ABDOMEN WITHOUT AND WITH CONTRAST TECHNIQUE: Multiplanar multisequence MR imaging of the abdomen was performed both before and after the administration of intravenous contrast. CONTRAST:  87mL GADAVIST GADOBUTROL 1 MMOL/ML IV SOLN COMPARISON:  Chest CTA on 06/25/2019, and AP CT on 01/05/2019 FINDINGS: Lower chest: No acute findings. Hepatobiliary: No hepatic masses identified. A tiny T2 hyperintense lesion is seen in the inferior right hepatic lobe which is too small to characterize but most likely represents a tiny cyst. Marked diffuse hepatic steatosis is seen. Gallbladder is unremarkable. No evidence of biliary ductal dilatation. Pancreas:  No mass or inflammatory changes. Spleen:  Within normal limits in size and appearance. Adrenals/Urinary Tract: Normal adrenal glands. Several simple renal cysts are seen in both kidneys. A 1 cm subcapsular lesion is seen in the posterior upper pole the left  kidney which shows marked T1 hyperintensity, T2 hypointensity, and lack of contrast enhancement on subtraction imaging. This is consistent with a benign Bosniak category 2 hemorrhagic cyst and corresponds with the lesion seen recent chest CTA. This is also stable compared to earlier abdomen CT. No evidence of renal masses. Mild right renal pelvicaliectasis is stable compared to previous CT. Stomach/Bowel: Visualized portion unremarkable. Vascular/Lymphatic: No pathologically enlarged lymph nodes identified. No abdominal aortic aneurysm. Other:  None. Musculoskeletal:  No suspicious bone lesions identified. IMPRESSION: 1. No evidence of abdominal abscess or other acute findings. 2. 1 cm benign Bosniak category 2 hemorrhagic cyst in the upper pole of left kidney, which corresponds with the lesion seen on recent chest CTA. Other benign Bosniak category 1 renal cysts are also seen bilaterally. No evidence of renal neoplasm. 3. Stable mild right renal pelvicaliectasis. 4. Marked hepatic steatosis. Electronically Signed   By: Marlaine Hind M.D.   On: 06/27/2019 11:02   DG Chest Port 1 View  Result Date: 06/25/2019 CLINICAL DATA:  Shortness of breath. Additional provided: History of COPD, CHF, shortness of breath, hypertension. EXAM: PORTABLE CHEST 1 VIEW COMPARISON:  Chest radiograph 01/06/2019 FINDINGS: Unchanged cardiomegaly. Hazy opacity throughout the left mid to lower lung field. The right lung is grossly clear. No evidence of pneumothorax. No acute bony abnormality. Redemonstrated prominent thoracic  dextrocurvature. Healed fracture of the posterior left eighth rib. IMPRESSION: Hazy opacity throughout the left mid to lower lung field. This may reflect pneumonia, atelectasis asymmetric edema or possibly a layering pleural effusion. The right lung is grossly clear. Unchanged cardiomegaly. Electronically Signed   By: Kellie Simmering DO   On: 06/25/2019 12:30   ECHOCARDIOGRAM COMPLETE  Result Date: 06/27/2019     ECHOCARDIOGRAM REPORT   Patient Name:   Louis [name] Date of Exam: 06/26/2019 Medical Rec #:  [id number]    Height:       71.0 in Accession #:    [id number]   Weight:       220.0 lb Date of Birth:  10/13/1955    BSA:          2.196 m Patient Age:    37 years     BP:           121/73 mmHg Patient Gender: M            HR:           86 bpm. Exam Location:  Inpatient Procedure: 2D Echo, Cardiac Doppler, Color Doppler and Intracardiac            Opacification Agent Indications:    Bacteremia 790.7 / R78.81  History:        Patient has prior history of Echocardiogram examinations, most                 recent 07/03/2017. Cardiomegaly, Pulmonary HTN,                 Signs/Symptoms:Shortness of Breath; Risk Factors:Hypertension                 and Non-Smoker. Sepsis.  Sonographer:    Vickie Epley RDCS Referring Phys: [id number] Patrecia Pour  Sonographer Comments: Technically difficult study due to poor echo windows. Image acquisition challenging due to patient body habitus. IMPRESSIONS  1. Left ventricular ejection fraction, by estimation, is 55 to 60%. The left ventricle has normal function. The left ventricle has no regional wall motion abnormalities. Left ventricular diastolic parameters were normal.  2. Right ventricular systolic function is mildly reduced at the RV apex. The right ventricular size is normal. Tricuspid regurgitation signal is inadequate for assessing PA pressure.  3. LA not well visualized. Left atrial size was mildly dilated by visual estimate.  4. The mitral valve is normal in structure. Trivial mitral valve regurgitation. No evidence of mitral stenosis.  5. The aortic valve is tricuspid. Aortic valve regurgitation is not visualized. No aortic stenosis is present.  6. Aortic dilatation noted. There is mild dilatation of the aortic root measuring 43 mm.  7. The inferior vena cava is normal in size with greater than 50% respiratory variability, suggesting right atrial pressure of 3 mmHg.  Conclusion(s)/Recommendation(s): No evidence of valvular vegetations on this transthoracic echocardiogram. Would recommend a transesophageal echocardiogram to exclude infective endocarditis if clinically indicated. This exam was suboptimal for the assessment of valvular lesions due to image quality. FINDINGS  Left Ventricle: Left ventricular ejection fraction, by estimation, is 55 to 60%. The left ventricle has normal function. The left ventricle has no regional wall motion abnormalities. Definity contrast agent was given IV to delineate the left ventricular  endocardial borders. The left ventricular internal cavity size was normal in size. There is no left ventricular hypertrophy. Left ventricular diastolic parameters were normal. Right Ventricle: The right ventricular size is normal. No increase in right ventricular wall  thickness. Right ventricular systolic function is mildly reduced at the RV apex. Tricuspid regurgitation signal is inadequate for assessing PA pressure. Left Atrium: LA not well visualized. Left atrial size was mildly dilated. Right Atrium: Right atrial size was not well visualized. Pericardium: There is no evidence of pericardial effusion. Presence of pericardial fat pad. Mitral Valve: The mitral valve is normal in structure. Normal mobility of the mitral valve leaflets. Trivial mitral valve regurgitation. No evidence of mitral valve stenosis. Tricuspid Valve: The tricuspid valve is normal in structure. Tricuspid valve regurgitation is not demonstrated. No evidence of tricuspid stenosis. Aortic Valve: The aortic valve is tricuspid. Aortic valve regurgitation is not visualized. No aortic stenosis is present. There is mild calcification of the aortic valve. Pulmonic Valve: The pulmonic valve was grossly normal. Pulmonic valve regurgitation is trivial. No evidence of pulmonic stenosis. Aorta: Aortic dilatation noted. There is mild dilatation of the aortic root measuring 43 mm. Venous: The inferior  vena cava is normal in size with greater than 50% respiratory variability, suggesting right atrial pressure of 3 mmHg. IAS/Shunts: No atrial level shunt detected by color flow Doppler.  LEFT VENTRICLE PLAX 2D LVIDd:         5.50 cm      Diastology LVIDs:         4.40 cm      LV e' lateral:   11.70 cm/s LV PW:         0.90 cm      LV E/e' lateral: 4.5 LV IVS:        0.90 cm      LV e' medial:    7.94 cm/s LVOT diam:     2.20 cm      LV E/e' medial:  6.6 LV SV:         72 LV SV Index:   33 LVOT Area:     3.80 cm  LV Volumes (MOD) LV vol d, MOD A2C: 119.0 ml LV vol d, MOD A4C: 156.0 ml LV vol s, MOD A2C: 62.5 ml LV vol s, MOD A4C: 60.4 ml LV SV MOD A2C:     56.5 ml LV SV MOD A4C:     156.0 ml LV SV MOD BP:      82.2 ml RIGHT VENTRICLE RV S prime:     12.50 cm/s TAPSE (M-mode): 1.6 cm LEFT ATRIUM             Index       RIGHT ATRIUM           Index LA diam:        4.20 cm 1.91 cm/m  RA Area:     13.20 cm LA Vol (A2C):   35.3 ml 16.08 ml/m RA Volume:   32.00 ml  14.57 ml/m LA Vol (A4C):   56.5 ml 25.73 ml/m LA Biplane Vol: 44.6 ml 20.31 ml/m  AORTIC VALVE LVOT Vmax:   117.00 cm/s LVOT Vmean:  79.700 cm/s LVOT VTI:    0.189 m  AORTA Ao Root diam: 4.30 cm MITRAL VALVE MV Area (PHT): 3.74 cm    SHUNTS MV Decel Time: 203 msec    Systemic VTI:  0.19 m MV E velocity: 52.60 cm/s  Systemic Diam: 2.20 cm MV A velocity: 53.00 cm/s MV E/A ratio:  0.99 Cherlynn Kaiser MD Electronically signed by Cherlynn Kaiser MD Signature Date/Time: 06/27/2019/6:20:35 AM    Final      Assessment/Plan: Enterococcal bacteremia = continue on ampicillin. Watch fever curve, anticipate to  trend downward. Will get repeat blood cx today. Suspect that fever last night due to not being therapeutic yet on abtx. Suspect that this is potential urinary source since urine cx + as well  - await to get TEE on Monday as well as repeat blood cx to see that he is clearing bacteremia  Peripheral neuropathy = noticeable since beginning of this year.  Impedes his mobility, recommend evaluation for CIR by PT/Ot.  transaminitis = trending down. Continue to monitor to see it returns to baseline.  [facility] Cell: [phone] Pager: [phone]  06/27/2019, 11:55 AM

## 2019-06-27 NOTE — Progress Notes (Signed)
PROGRESS NOTE  AISON MALVEAUX  HAL:[id number] DOB: 1955-10-17 DOA: 06/25/2019 PCP: Rutherford Guys, MD  Outpatient Specialists: GI, Dr. [name]; Cardiology, Dr. Georgina Peer; Pulmonology, [name]  Brief Narrative: EQUAN COGBILL is a 64 y.o. male with a history of scoliosis with restrictive and obstructive lung disease, chronic HFpEF, OSA and nocturnal hypoxia on CPAP and 2L O2, essential tremor, hepatic steatosis, and perforated duodenal ulcer s/p [name] Jan 2021 who presented to the ED with increasing falls and progressive weakness. He was found to be febrile to 101.71F and tachypneic though not hypoxemic. Work up included CXR with indistinct hazy left-sided opacity and subsequent CTA chest after d-dimer was 1.90 which had motion degradation without PE or focal consolidation. There were incidental findings including renal lesions for which abdominal MR was recommended. Multiple metabolic derangements including hyponatremia (125), hyperkalemia (5.8) acute renal failure (SCr 1.99 from baseline of 1), and mild LFT elevations. BNP 229.1 and troponin negative. Hgb 10.4g/dl, and platelets 133. Urine micro showed many bacteria and hyaline casts without RBCs or WBCs. Ceftriaxone and 500cc NS given, blood and urine cultures sent, neurology consulted, and hospitalists consulted for admission.  Blood cultures (2 of 4 bottles) have grown Enterococcus for which ampicillin is started, ID consulted, and TEE is planned. Urine culture has also grown E. faecalis. MRI abdomen was performed showing no intraabdominal abscess, and benign-appearing renal cysts.   Hospitalization has been complicated by diffuse severe weakness and waxing/waning delirium.  Assessment & Plan: Active Problems:   HTN (hypertension)   Anxiety state   Essential tremor   Renal artery stenosis in 1 of 2 vessels (HCC)   Transaminitis   SOB (shortness of breath)   Chronic respiratory failure with hypoxia and hypercapnia (HCC)   OSA (obstructive  sleep apnea)   Hyperkalemia   Hyponatremia   ARF (acute renal failure) (HCC)   Bacteremia due to Enterococcus  Acute renal failure: Prerenal by FENa (.41%) with hyaline casts. Subnephrotic proteinuria nonspecific, nonoliguric. - SCr improving again, CrCl nearing 20ml/min w/IBW. No significant edema developing, so will continue isotonic saline.  - Trend BMP, avoid nephrotoxins.  Enterococcus bacteremia: Probably urinary source with urine culture + for same organism. No abscess on cross-sectional abdominal imaging. - Ampicillin started. Following fever curve. Febrile, but not developing sepsis.  - Echo without valvular vegetation, views were limted. TEE arranged for 6/28.  - Repeated cultures this AM (24 hrs after abx started).   Acute delirium: With negative neuroimaging, waxing/waning course.  - Delirium precautions - Treat infection and monitor - Supportive care  Hyperkalemia: Recurrent problem, resolved by holding spironolactone and ARB.   - Continue to hold spironolactone and ARB. Would be in favor of switching back to lasix from spironolactone, though will defer to cardiology as outpatient.  - Continue cardiac monitoring. QTc 470msec, NSR on my personal review this morning.  Hyponatremia: Recurrent problem/chronic, suspect hypovolemic, possibly exacerbated by EtOH.  - Continue improvement at goal rate. Continue isotonic saline.   Chronic HFpEF (LVEF wnl, G2DD), longstanding resistant HTN: BNP elevated but lower than previous value and does not appear overloaded by exam. No effusions or airspace opacities on CTA chest.  - Echocardiogram repeated shows improvement with LVEF 73-53%, normal diastolic parameters, normal RV size, reported mild reduction in RV apical function of uncertain significance. IVC appears normal. - Hold spironolactone and ARB - Continue metoprolol and norvasc.  - Monitor daily weights, I/O  Aortic root dilation: 25mm.  - Surveillance recommended.    Frequent falls, progressive  generalized weakness:  - Neurology recommendations appreciated, CT and MRI brain unrevealing. - PT/OT consulted. CIR consult recommended.   Essential tremor: Worsening with ataxic features and increasing falls. ?if worsened with gabapentin in setting of renal failure as this has improved with improvement in CrCl. - Due to presence of peripheral neuropathy, checking vitamin B12 (low normal), ceruloplasmin, thiamine, A1c (5.7%). Supplementing thiamine empirically and B12.  LFT elevation and hepatic steatosis: Hepatitis panel negative.  - Alcohol moderation counseling provided. - Trending, showing improvement.  Renal lesions: Seen incidentally on CTA chest. Benign appearance on MRI  OSA, chronic nocturnal hypoxemic respiratory failure, restrictive lung disease:  - Continue CPAP with 2L O2 qHS.   History of perforated duodenal ulcer s/p [name] Jan 2021: Subsequent EGD confirmed resolution of duodenal ulceration. H. pylori has been negative.  - Continue lifelong PPI.  - Avoid NSAIDs  Chronic hyperchromic anemia: Unclear etiology, possible AOCD with high ferritin (though likely acting as acute phase reactant) and low TIBC - Supp B12 and monitor  Thrombocytopenia: ?if this is due to hepatic steatosis vs. early sepsis.  - Continue monitoring.   Alcohol overuse: No history of withdrawal and is drinking less alcohol than previously.  - Alcohol moderation counseling performed.  - No current evidence of withdrawal.  Rounded 8 mm area of lucency within the C3 vertebral body and 9 mm sclerotic lesion within the C6 vertebral body: Incidental findings on cervical spine CT at admission. This is nonspecific.  - Will plan nonemergent nuclear medicine bone scan  Aortic atherosclerosis: Noted.   Anxiety:  - Continue prn xanax as above.   History of left foot fractures s/p ORIF and recent closed nondisplaced fracture of the base of the 5th  metatarsal. - WBAT in CAM boot, PT/OT  DVT prophylaxis: Heparin [place] Code Status: Full Family Communication: Spouse Disposition Plan:  Status is: Inpatient  Remains inpatient appropriate because:Ongoing diagnostic testing needed not appropriate for outpatient work up and IV treatments appropriate due to intensity of illness or inability to take PO  Dispo: The patient is from: Home              Anticipated d/c is to: TBD              Anticipated d/c date is: > 3 days              Patient currently is not medically stable to d/c.  Consultants:   ID  Cardiology for TEE  Procedures:   TEE planned 6/28  Antimicrobials:  Ceftriaxone 6/24  Ampicillin 6/25   Subjective: More coherent this morning, still thinks we're in [place]. Denies any pain anywhere, specifically no chest pain, abdominal pain, neck pain or stiffness. No rectal pain, hematuria, dysuria. No diarrhea reported overnight. No evidence of withdrawal noted.  Objective: Vitals:   06/27/19 0500 06/27/19 0548 06/27/19 0929 06/27/19 1500  BP:  114/70 134/84 (!) 152/89  Pulse:  73 87 93  Resp:  (!) 24  20  Temp:  99.3 F (37.4 C)  99.8 F (37.7 C)  TempSrc:  Oral  Axillary  SpO2:  92% 93%   Weight: 94.6 kg       Intake/Output Summary (Last 24 hours) at 06/27/2019 1603 Last data filed at 06/27/2019 1400 Gross per 24 hour  Intake 1353.45 ml  Output 700 ml  Net 653.45 ml   Filed Weights   06/27/19 0500  Weight: 94.6 kg   Gen: 64 y.o. male in no distress Pulm: Nonlabored breathing room  air. Clear. CV: Regular rate and rhythm. No murmur, rub, or gallop. No JVD, no pitting dependent edema. GI: Abdomen soft, protuberant, nontender +BS.   Ext: Warm, no deformities Skin: No new rashes, lesions or ulcers on visualized skin. Neuro: Alert and incompletely oriented. No focal neurological deficits. Psych: Judgement and insight appear mildly impaired, waxing/waning. Mood euthymic & affect congruent. Behavior is  appropriate.    Data Reviewed: I have personally reviewed following labs and imaging studies  CBC: Recent Labs  Lab 06/25/19 1144 06/26/19 0620 06/27/19 1122  WBC 6.1 6.9 6.7  NEUTROABS 5.5  --   --   HGB 10.4* 9.5* 9.3*  HCT 30.0* 28.2* 27.7*  MCV 99.7 102.2* 103.7*  PLT 133* 122* 818*   Basic Metabolic Panel: Recent Labs  Lab 06/25/19 1335 06/26/19 0620 06/27/19 1122  NA 125* 126* 131*  K 5.8* 4.4 4.3  CL 87* 89* 93*  CO2 27 23 22   GLUCOSE 130* 104* 109*  BUN 33* 29* 29*  CREATININE 1.99* 1.65* 1.57*  CALCIUM 9.1 8.6* 8.2*   GFR: Estimated Creatinine Clearance: 57.3 mL/min (A) (by C-G formula based on SCr of 1.57 mg/dL (H)). Liver Function Tests: Recent Labs  Lab 06/25/19 1335 06/26/19 0620 06/27/19 1122  AST 68* 268* 125*  ALT 57* 116* 97*  ALKPHOS 90 70 50  BILITOT 0.8 0.8 0.7  PROT 7.7 7.5 7.0  ALBUMIN 4.0 3.8 3.2*   No results for input(s): LIPASE, AMYLASE in the last 168 hours. Recent Labs  Lab 06/25/19 2122  AMMONIA 25   Coagulation Profile: No results for input(s): INR, PROTIME in the last 168 hours. Cardiac Enzymes: Recent Labs  Lab 06/25/19 1335  CKTOTAL 344   BNP (last 3 results) No results for input(s): PROBNP in the last 8760 hours. HbA1C: Recent Labs    06/26/19 0620  HGBA1C 5.7*   CBG: Recent Labs  Lab 06/25/19 1226  GLUCAP 140*   Lipid Profile: No results for input(s): CHOL, HDL, LDLCALC, TRIG, CHOLHDL, LDLDIRECT in the last 72 hours. Thyroid Function Tests: No results for input(s): TSH, T4TOTAL, FREET4, T3FREE, THYROIDAB in the last 72 hours. Anemia Panel: Recent Labs    06/25/19 2122  VITAMINB12 237  FOLATE 8.4  FERRITIN 2,333*  TIBC 209*  IRON 30*  RETICCTPCT 1.4   Urine analysis:    Component Value Date/Time   COLORURINE YELLOW 06/25/2019 1144   APPEARANCEUR CLEAR 06/25/2019 1144   APPEARANCEUR Clear 02/27/2017 1555   LABSPEC 1.011 06/25/2019 1144   PHURINE 5.0 06/25/2019 1144   GLUCOSEU >=500 (A)  06/25/2019 1144   HGBUR SMALL (A) 06/25/2019 1144   BILIRUBINUR NEGATIVE 06/25/2019 1144   BILIRUBINUR Negative 02/27/2017 1555   KETONESUR 5 (A) 06/25/2019 1144   PROTEINUR NEGATIVE 06/25/2019 1144   NITRITE NEGATIVE 06/25/2019 1144   LEUKOCYTESUR NEGATIVE 06/25/2019 1144   Recent Results (from the past 240 hour(s))  Urine culture     Status: Abnormal (Preliminary result)   Collection Time: 06/25/19 12:02 PM   Specimen: Urine, Clean Catch  Result Value Ref Range Status   Specimen Description   Final    URINE, CLEAN CATCH Performed at [facility], [place] [address], [place], [place] [address]    Special Requests   Final    NONE Performed at [facility], [place] [address] [place], [place] [address]    Culture >=100,000 COLONIES/mL ENTEROCOCCUS FAECALIS (A)  Final   Report Status PENDING  Incomplete  Culture, blood (routine x 2)  Status: Abnormal (Preliminary result)   Collection Time: 06/25/19  1:08 PM   Specimen: BLOOD  Result Value Ref Range Status   Specimen Description   Final    BLOOD RIGHT ANTECUBITAL Performed at [facility], [place] [address], [place], [place] [address]    Special Requests   Final    BOTTLES DRAWN AEROBIC AND ANAEROBIC Blood Culture adequate volume Performed at [place] [address], [place], [place] [address]    Culture  Setup Time   Final    GRAM POSITIVE COCCI IN BOTH AEROBIC AND ANAEROBIC BOTTLES CRITICAL RESULT CALLED TO, READ BACK BY AND VERIFIED WITH: M. RENZ PHARMD, AT 0981 06/26/19 BY D. VANHOOK    Culture (A)  Final    ENTEROCOCCUS FAECALIS SUSCEPTIBILITIES TO FOLLOW Performed at [facility], [place] [address], [place], [place] [address]    Report Status PENDING  Incomplete  SARS Coronavirus 2 by RT PCR (hospital order, performed in [facility] hospital lab) Nasopharyngeal Nasopharyngeal Swab     Status: None   Collection Time: 06/25/19  1:08 PM   Specimen:  Nasopharyngeal Swab  Result Value Ref Range Status   SARS Coronavirus 2 NEGATIVE NEGATIVE Final    Comment: (NOTE) SARS-CoV-2 target nucleic acids are NOT DETECTED.  The SARS-CoV-2 RNA is generally detectable in upper and lower respiratory specimens during the acute phase of infection. The lowest concentration of SARS-CoV-2 viral copies this assay can detect is 250 copies / mL. A negative result does not preclude SARS-CoV-2 infection and should not be used as the sole basis for treatment or other patient management decisions.  A negative result may occur with improper specimen collection / handling, submission of specimen other than nasopharyngeal swab, presence of viral mutation(s) within the areas targeted by this assay, and inadequate number of viral copies (<250 copies / mL). A negative result must be combined with clinical observations, patient history, and epidemiological information.  Fact Sheet for Patients:   StrictlyIdeas.no  Fact Sheet for Healthcare Providers: BankingDealers.co.za  This test is not yet approved or  cleared by the [place] FDA and has been authorized for detection and/or diagnosis of SARS-CoV-2 by FDA under an Emergency Use Authorization (EUA).  This EUA will remain in effect (meaning this test can be used) for the duration of the COVID-19 declaration under Section 564(b)(1) of the Act, 21 U.S.C. section 360bbb-3(b)(1), unless the authorization is terminated or revoked sooner.  Performed at [facility], [place] [address] [place], [place] [address]   Blood Culture ID Panel (Reflexed)     Status: Abnormal   Collection Time: 06/25/19  1:08 PM  Result Value Ref Range Status   Enterococcus species DETECTED (A) NOT DETECTED Final    Comment: CRITICAL RESULT CALLED TO, READ BACK BY AND VERIFIED WITH: M. RENZ PHARMD, AT 2130 06/26/19 BY D. VANHOOK    Vancomycin resistance NOT DETECTED NOT  DETECTED Final   Listeria monocytogenes NOT DETECTED NOT DETECTED Final   Staphylococcus species NOT DETECTED NOT DETECTED Final   Staphylococcus aureus (BCID) NOT DETECTED NOT DETECTED Final   Streptococcus species NOT DETECTED NOT DETECTED Final   Streptococcus agalactiae NOT DETECTED NOT DETECTED Final   Streptococcus pneumoniae NOT DETECTED NOT DETECTED Final   Streptococcus pyogenes NOT DETECTED NOT DETECTED Final   Acinetobacter baumannii NOT DETECTED NOT DETECTED Final   Enterobacteriaceae species NOT DETECTED NOT DETECTED Final   Enterobacter cloacae complex NOT DETECTED NOT DETECTED Final   Escherichia  coli NOT DETECTED NOT DETECTED Final   Klebsiella oxytoca NOT DETECTED NOT DETECTED Final   Klebsiella pneumoniae NOT DETECTED NOT DETECTED Final   Proteus species NOT DETECTED NOT DETECTED Final   Serratia marcescens NOT DETECTED NOT DETECTED Final   Haemophilus influenzae NOT DETECTED NOT DETECTED Final   Neisseria meningitidis NOT DETECTED NOT DETECTED Final   Pseudomonas aeruginosa NOT DETECTED NOT DETECTED Final   Candida albicans NOT DETECTED NOT DETECTED Final   Candida glabrata NOT DETECTED NOT DETECTED Final   Candida krusei NOT DETECTED NOT DETECTED Final   Candida parapsilosis NOT DETECTED NOT DETECTED Final   Candida tropicalis NOT DETECTED NOT DETECTED Final    Comment: Performed at [facility], [place] [address] [place], [place] [address]  Culture, blood (routine x 2)     Status: None (Preliminary result)   Collection Time: 06/25/19  1:52 PM   Specimen: BLOOD LEFT HAND  Result Value Ref Range Status   Specimen Description   Final    BLOOD LEFT HAND Performed at [place] [address], [place], [place] [address]    Special Requests   Final    BOTTLES DRAWN AEROBIC AND ANAEROBIC Blood Culture adequate volume Performed at [place] [address] [place] [address] [place], [place] [address]    Culture   Final    NO GROWTH 2  DAYS Performed at [place] [address] [place], [place] [address]    Report Status PENDING  Incomplete      Radiology Studies: MR BRAIN WO CONTRAST  Result Date: 06/25/2019 CLINICAL DATA:  Stroke.  Weakness. EXAM: MRI HEAD WITHOUT CONTRAST TECHNIQUE: Multiplanar, multiecho pulse sequences of the brain and surrounding structures were obtained without intravenous contrast. COMPARISON:  CT head 06/25/2019 FINDINGS: Brain: Negative for acute infarct Moderate atrophy. Minimal white matter changes. Brainstem and cerebellum intact. Negative for hemorrhage or mass. No fluid collection or midline shift. Vascular: Normal arterial flow voids. Skull and upper cervical spine: Normal bone marrow Sinuses/Orbits: Paranasal sinuses clear. No orbital lesion. Other: None IMPRESSION: Negative for acute infarct Moderate atrophy.  No acute abnormality. Electronically Signed   By: Franchot Gallo M.D.   On: 06/25/2019 18:32   MR ABDOMEN W WO CONTRAST  Result Date: 06/27/2019 CLINICAL DATA:  Indeterminate left renal lesion on recent chest CTA. Suspected abdominal infection or abscess. Renal insufficiency. EXAM: MRI ABDOMEN WITHOUT AND WITH CONTRAST TECHNIQUE: Multiplanar multisequence MR imaging of the abdomen was performed both before and after the administration of intravenous contrast. CONTRAST:  26mL GADAVIST GADOBUTROL 1 MMOL/ML IV SOLN COMPARISON:  Chest CTA on 06/25/2019, and AP CT on 01/05/2019 FINDINGS: Lower chest: No acute findings. Hepatobiliary: No hepatic masses identified. A tiny T2 hyperintense lesion is seen in the inferior right hepatic lobe which is too small to characterize but most likely represents a tiny cyst. Marked diffuse hepatic steatosis is seen. Gallbladder is unremarkable. No evidence of biliary ductal dilatation. Pancreas:  No mass or inflammatory changes. Spleen:  Within normal limits in size and appearance. Adrenals/Urinary Tract: Normal adrenal glands. Several simple renal cysts  are seen in both kidneys. A 1 cm subcapsular lesion is seen in the posterior upper pole the left kidney which shows marked T1 hyperintensity, T2 hypointensity, and lack of contrast enhancement on subtraction imaging. This is consistent with a benign Bosniak category 2 hemorrhagic cyst and corresponds with the lesion seen recent chest CTA. This is also stable compared to earlier abdomen CT. No evidence  of renal masses. Mild right renal pelvicaliectasis is stable compared to previous CT. Stomach/Bowel: Visualized portion unremarkable. Vascular/Lymphatic: No pathologically enlarged lymph nodes identified. No abdominal aortic aneurysm. Other:  None. Musculoskeletal:  No suspicious bone lesions identified. IMPRESSION: 1. No evidence of abdominal abscess or other acute findings. 2. 1 cm benign Bosniak category 2 hemorrhagic cyst in the upper pole of left kidney, which corresponds with the lesion seen on recent chest CTA. Other benign Bosniak category 1 renal cysts are also seen bilaterally. No evidence of renal neoplasm. 3. Stable mild right renal pelvicaliectasis. 4. Marked hepatic steatosis. Electronically Signed   By: Marlaine Hind M.D.   On: 06/27/2019 11:02   ECHOCARDIOGRAM COMPLETE  Result Date: 06/27/2019    ECHOCARDIOGRAM REPORT   Patient Name:   LOCKLAN CANOY Date of Exam: 06/26/2019 Medical Rec #:  [id number]    Height:       71.0 in Accession #:    [id number]   Weight:       220.0 lb Date of Birth:  01/06/1955    BSA:          2.196 m Patient Age:    5 years     BP:           121/73 mmHg Patient Gender: M            HR:           86 bpm. Exam Location:  Inpatient Procedure: 2D Echo, Cardiac Doppler, Color Doppler and Intracardiac            Opacification Agent Indications:    Bacteremia 790.7 / R78.81  History:        Patient has prior history of Echocardiogram examinations, most                 recent 07/03/2017. Cardiomegaly, Pulmonary HTN,                 Signs/Symptoms:Shortness of Breath; Risk  Factors:Hypertension                 and Non-Smoker. Sepsis.  Sonographer:    Vickie Epley RDCS Referring Phys: [id number] Patrecia Pour  Sonographer Comments: Technically difficult study due to poor echo windows. Image acquisition challenging due to patient body habitus. IMPRESSIONS  1. Left ventricular ejection fraction, by estimation, is 55 to 60%. The left ventricle has normal function. The left ventricle has no regional wall motion abnormalities. Left ventricular diastolic parameters were normal.  2. Right ventricular systolic function is mildly reduced at the RV apex. The right ventricular size is normal. Tricuspid regurgitation signal is inadequate for assessing PA pressure.  3. LA not well visualized. Left atrial size was mildly dilated by visual estimate.  4. The mitral valve is normal in structure. Trivial mitral valve regurgitation. No evidence of mitral stenosis.  5. The aortic valve is tricuspid. Aortic valve regurgitation is not visualized. No aortic stenosis is present.  6. Aortic dilatation noted. There is mild dilatation of the aortic root measuring 43 mm.  7. The inferior vena cava is normal in size with greater than 50% respiratory variability, suggesting right atrial pressure of 3 mmHg. Conclusion(s)/Recommendation(s): No evidence of valvular vegetations on this transthoracic echocardiogram. Would recommend a transesophageal echocardiogram to exclude infective endocarditis if clinically indicated. This exam was suboptimal for the assessment of valvular lesions due to image quality. FINDINGS  Left Ventricle: Left ventricular ejection fraction, by estimation, is 55 to 60%. The left ventricle has normal function.  The left ventricle has no regional wall motion abnormalities. Definity contrast agent was given IV to delineate the left ventricular  endocardial borders. The left ventricular internal cavity size was normal in size. There is no left ventricular hypertrophy. Left ventricular diastolic parameters  were normal. Right Ventricle: The right ventricular size is normal. No increase in right ventricular wall thickness. Right ventricular systolic function is mildly reduced at the RV apex. Tricuspid regurgitation signal is inadequate for assessing PA pressure. Left Atrium: LA not well visualized. Left atrial size was mildly dilated. Right Atrium: Right atrial size was not well visualized. Pericardium: There is no evidence of pericardial effusion. Presence of pericardial fat pad. Mitral Valve: The mitral valve is normal in structure. Normal mobility of the mitral valve leaflets. Trivial mitral valve regurgitation. No evidence of mitral valve stenosis. Tricuspid Valve: The tricuspid valve is normal in structure. Tricuspid valve regurgitation is not demonstrated. No evidence of tricuspid stenosis. Aortic Valve: The aortic valve is tricuspid. Aortic valve regurgitation is not visualized. No aortic stenosis is present. There is mild calcification of the aortic valve. Pulmonic Valve: The pulmonic valve was grossly normal. Pulmonic valve regurgitation is trivial. No evidence of pulmonic stenosis. Aorta: Aortic dilatation noted. There is mild dilatation of the aortic root measuring 43 mm. Venous: The inferior vena cava is normal in size with greater than 50% respiratory variability, suggesting right atrial pressure of 3 mmHg. IAS/Shunts: No atrial level shunt detected by color flow Doppler.  LEFT VENTRICLE PLAX 2D LVIDd:         5.50 cm      Diastology LVIDs:         4.40 cm      LV e' lateral:   11.70 cm/s LV PW:         0.90 cm      LV E/e' lateral: 4.5 LV IVS:        0.90 cm      LV e' medial:    7.94 cm/s LVOT diam:     2.20 cm      LV E/e' medial:  6.6 LV SV:         72 LV SV Index:   33 LVOT Area:     3.80 cm  LV Volumes (MOD) LV vol d, MOD A2C: 119.0 ml LV vol d, MOD A4C: 156.0 ml LV vol s, MOD A2C: 62.5 ml LV vol s, MOD A4C: 60.4 ml LV SV MOD A2C:     56.5 ml LV SV MOD A4C:     156.0 ml LV SV MOD BP:      82.2 ml  RIGHT VENTRICLE RV S prime:     12.50 cm/s TAPSE (M-mode): 1.6 cm LEFT ATRIUM             Index       RIGHT ATRIUM           Index LA diam:        4.20 cm 1.91 cm/m  RA Area:     13.20 cm LA Vol (A2C):   35.3 ml 16.08 ml/m RA Volume:   32.00 ml  14.57 ml/m LA Vol (A4C):   56.5 ml 25.73 ml/m LA Biplane Vol: 44.6 ml 20.31 ml/m  AORTIC VALVE LVOT Vmax:   117.00 cm/s LVOT Vmean:  79.700 cm/s LVOT VTI:    0.189 m  AORTA Ao Root diam: 4.30 cm MITRAL VALVE MV Area (PHT): 3.74 cm    SHUNTS MV Decel Time: 203 msec  Systemic VTI:  0.19 m MV E velocity: 52.60 cm/s  Systemic Diam: 2.20 cm MV A velocity: 53.00 cm/s MV E/A ratio:  0.99 Cherlynn Kaiser MD Electronically signed by Cherlynn Kaiser MD Signature Date/Time: 06/27/2019/6:20:35 AM    Final     Scheduled Meds: . amLODipine  5 mg Oral Daily  . cyanocobalamin  1,000 mcg Intramuscular Daily  . folic acid  1 mg Oral Daily  . gabapentin  300 mg Oral BID  . heparin  5,000 Units Subcutaneous Q8H  . metoprolol succinate  150 mg Oral Daily  . multivitamin with minerals  1 tablet Oral Daily  . thiamine  100 mg Oral Daily   Continuous Infusions: . sodium chloride Stopped (06/26/19 1900)  . sodium chloride Stopped (06/26/19 2125)  . ampicillin (OMNIPEN) IV 2 g (06/27/19 1405)     LOS: 2 days   Time spent: 35 minutes.  Patrecia Pour, MD [facility] [email or web address] 06/27/2019, 4:03 PM

## 2019-06-27 NOTE — Evaluation (Signed)
Physical Therapy Evaluation Patient Details Name: Louis [name] MRN: [id number] DOB: 1955-03-13 Today's Date: 06/27/2019   History of Present Illness  Louis [name] is a 64 y.o. male with a history of scoliosis with restrictive and obstructive lung disease, chronic HFpEF, OSA and nocturnal hypoxia on CPAP and 2L O2, essential tremor, hepatic steatosis, and perforated duodenal ulcer s/p [name] Jan 2021 who presented to the ED with increasing falls and progressive weakness. He was found to be febrile to 101.39F and tachypneic though not hypoxemic. Work up included CXR with indistinct hazy left-sided opacity and subsequent CTA chest after d-dimer was 1.90 which had motion degradation without PE or focal consolidation. There were incidental findings including renal lesions for which abdominal MR was recommended.  Clinical Impression  Pt tolerated evaluation today, wants to get stronger and better, however just sitting tolerance, assessment and standing was very fatiguing for him today. Pt with great overall weakness, underlying tremor that pt pt stes has been there for a long time however has increased greatly over the past few weeks. Pt has had increased falls and increased difficulty with mobility, slight increase in cognitive deficits and slow to process as well. Feel pt would benefit from CIR level of therapies due level of weakness he is experiencing and pt stated his prior level was much more independent that this.     Follow Up Recommendations CIR    Equipment Recommendations  Rolling walker with 5" wheels    Recommendations for Other Services       Precautions / Restrictions Precautions Precautions: Fall Precaution Comments: pt knows his definicts and that he is weak and has been falling., so do not feel like he would try to move without assistance Restrictions Other Position/Activity Restrictions: per chart: WBAT in steel insert shoes per wife      Mobility  Bed Mobility Overal  bed mobility: Needs Assistance Bed Mobility: Rolling;Supine to Sit;Sit to Supine;Sidelying to Sit;Sit to Sidelying Rolling: Max assist Sidelying to sit: Max assist     Sit to sidelying: Max assist General bed mobility comments: Pt needed cues for rolling for sidying to sit bed mobility. Performed this way due to pt stating he has pain in his belly and con't sit up. from supine, so educated on rolling first. Max verbal and tacile cues  Transfers Overall transfer level: Needs assistance Equipment used: Rolling walker (2 wheeled) Transfers: Sit to/from Stand Sit to Stand: Max assist;+2 physical assistance         General transfer comment: pt with great fear and difficulty with powering up for sit to stand. Pt with posterior ( retro lean during entire standing) stood at bedside for 20 seconds , and side stpped wiht Max cueing and asisstance towards the head of the bed.  Ambulation/Gait Ambulation/Gait assistance: Max assist;+2 physical assistance Gait Distance (Feet): 3 Feet (side stepping towards head of bed) Assistive device: Rolling walker (2 wheeled)          Stairs            Wheelchair Mobility    Modified Rankin (Stroke Patients Only)       Balance                                             Pertinent Vitals/Pain Pain Assessment: No/denies pain    Home Living Family/patient expects to be discharged to:: Inpatient  rehab Living Arrangements: Spouse/significant other                    Prior Function Level of Independence: Independent with assistive device(s)         Comments: Unsure of clarity of history reported by patient. Patient was doing well until fall 2 weeks resulting in R  5th metatarsal fracture. Per spouse patient WBAT with carbon steel insert shoes. Patient had been able to traverse stairs and independent with ADLs. 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 however reported he has been falling and had another fall this  [place] and had surgery on his Left foot.     Hand Dominance        Extremity/Trunk Assessment   Upper Extremity Assessment Upper Extremity Assessment: Defer to OT evaluation    Lower Extremity Assessment Lower Extremity Assessment: Generalized weakness (grossly 3-/5 for all LE movement. Pt does have DF/and limited PF on B ankles due to surgeries.)    Cervical / Trunk Assessment Cervical / Trunk Assessment:  (holds head titled to the right, however has rotational WNL , but lists R sittig and with head .)  Communication   Communication: No difficulties;Other (comment) (slow to repond at times and flat affect)  Cognition Arousal/Alertness: Awake/alert   Overall Cognitive Status: Impaired/Different from baseline Area of Impairment: Following commands;Safety/judgement;Orientation                       Following Commands: Follows one step commands with increased time              General Comments      Exercises     Assessment/Plan    PT Assessment Patient needs continued PT services  PT Problem List Decreased strength;Decreased range of motion;Decreased activity tolerance;Decreased balance;Decreased mobility;Decreased coordination;Decreased knowledge of use of DME;Decreased safety awareness       PT Treatment Interventions DME instruction;Therapeutic exercise;Gait training;Stair training;Functional mobility training;Therapeutic activities;Patient/family education;Neuromuscular re-education;Balance training    PT Goals (Current goals can be found in the Care Plan section)  Acute Rehab PT Goals Patient Stated Goal: I want to be able to walk again PT Goal Formulation: With patient Time For Goal Achievement: 07/10/19 Potential to Achieve Goals: Good    Frequency Min 3X/week   Barriers to discharge        Co-evaluation               AM-PAC PT "6 Clicks" Mobility  Outcome Measure Help needed turning from your back to your side while in a flat bed  without using bedrails?: A Lot Help needed moving from lying on your back to sitting on the side of a flat bed without using bedrails?: A Lot Help needed moving to and from a bed to a chair (including a wheelchair)?: A Lot Help needed standing up from a chair using your arms (e.g., wheelchair or bedside chair)?: A Lot Help needed to walk in hospital room?: A Lot Help needed climbing 3-5 steps with a railing? : A Lot 6 Click Score: 12    End of Session Equipment Utilized During Treatment: Gait belt Activity Tolerance: Patient limited by fatigue Patient left: in bed;with call bell/phone within reach;with bed alarm set Nurse Communication: Mobility status PT Visit Diagnosis: Other abnormalities of gait and mobility (R26.89)    Time: 1015-1100 PT Time Calculation (min) (ACUTE ONLY): 45 min   Charges:   PT Evaluation $PT Eval Moderate Complexity: 1 Mod PT Treatments $Therapeutic Activity: 23-37  mins        Gatha Mayer, PT, MPT Acute Rehabilitation Services Office: [phone] Pager: [phone] 06/27/2019   Clide Dales 06/27/2019, 1:18 PM

## 2019-06-27 NOTE — Progress Notes (Signed)
   06/27/19 1606  Assess: MEWS Score  Temp (!) 102.7 F (39.3 C)  BP 132/77  Pulse Rate 88  Resp (!) 26  Level of Consciousness Alert  SpO2 (!) 88 %  O2 Device Room Air  Assess: MEWS Score  MEWS Temp 2  MEWS Systolic 0  MEWS Pulse 0  MEWS RR 2  MEWS LOC 0  MEWS Score 4  MEWS Score Color Red  Assess: if the MEWS score is Yellow or Red  Were vital signs taken at a resting state? Yes  Focused Assessment Documented focused assessment  Early Detection of Sepsis Score *See Row Information* Medium  MEWS guidelines implemented *See Row Information* Yes  Treat  MEWS Interventions Administered prn meds/treatments  Take Vital Signs  Increase Vital Sign Frequency  Red: Q 1hr X 4 then Q 4hr X 4, if remains red, continue Q 4hrs  Escalate  MEWS: Escalate Red: discuss with charge nurse/RN and provider, consider discussing with RRT  Notify: Charge Nurse/RN  Name of Charge Nurse/RN Notified Ify, RN  Date Charge Nurse/RN Notified 06/27/19  Time Charge Nurse/RN Notified 56  Notify: Provider  Provider Name/Title Dr. Bonner Puna  Date Provider Notified 06/27/19  Time Provider Notified 1610  Notification Type Call  Notification Reason Change in status  Response See new orders  Date of Provider Response 06/27/19  Time of Provider Response 1611

## 2019-06-28 LAB — DIFFERENTIAL
Abs Immature Granulocytes: 0.04 10*3/uL (ref 0.00–0.07)
Basophils Absolute: 0 10*3/uL (ref 0.0–0.1)
Basophils Relative: 0 %
Eosinophils Absolute: 0 10*3/uL (ref 0.0–0.5)
Eosinophils Relative: 0 %
Immature Granulocytes: 1 %
Lymphocytes Relative: 12 %
Lymphs Abs: 0.7 10*3/uL (ref 0.7–4.0)
Monocytes Absolute: 1.1 10*3/uL — ABNORMAL HIGH (ref 0.1–1.0)
Monocytes Relative: 21 %
Neutro Abs: 3.6 10*3/uL (ref 1.7–7.7)
Neutrophils Relative %: 66 %

## 2019-06-28 LAB — URINE CULTURE: Culture: >=100000 — AB

## 2019-06-28 LAB — CULTURE, BLOOD (ROUTINE X 2): Special Requests: ADEQUATE

## 2019-06-28 LAB — COMPREHENSIVE METABOLIC PANEL
ALT: 70 U/L — ABNORMAL HIGH (ref 0–44)
AST: 76 U/L — ABNORMAL HIGH (ref 15–41)
Albumin: 2.8 g/dL — ABNORMAL LOW (ref 3.5–5.0)
Alkaline Phosphatase: 44 U/L (ref 38–126)
Anion gap: 10 (ref 5–15)
BUN: 23 mg/dL (ref 8–23)
CO2: 24 mmol/L (ref 22–32)
Calcium: 8 mg/dL — ABNORMAL LOW (ref 8.9–10.3)
Chloride: 99 mmol/L (ref 98–111)
Creatinine, Ser: 1.36 mg/dL — ABNORMAL HIGH (ref 0.61–1.24)
GFR calc Af Amer: >60 mL/min (ref >60)
GFR calc non Af Amer: 55 mL/min — ABNORMAL LOW (ref >60)
Glucose, Bld: 95 mg/dL (ref 70–99)
Potassium: 4 mmol/L (ref 3.5–5.1)
Sodium: 133 mmol/L — ABNORMAL LOW (ref 135–145)
Total Bilirubin: 0.8 mg/dL (ref 0.3–1.2)
Total Protein: 6.2 g/dL — ABNORMAL LOW (ref 6.5–8.1)

## 2019-06-28 LAB — CBC
HCT: 26.5 % — ABNORMAL LOW (ref 39.0–52.0)
Hemoglobin: 8.7 g/dL — ABNORMAL LOW (ref 13.0–17.0)
MCH: 34.1 pg — ABNORMAL HIGH (ref 26.0–34.0)
MCHC: 32.8 g/dL (ref 30.0–36.0)
MCV: 103.9 fL — ABNORMAL HIGH (ref 80.0–100.0)
Platelets: 118 10*3/uL — ABNORMAL LOW (ref 150–400)
RBC: 2.55 MIL/uL — ABNORMAL LOW (ref 4.22–5.81)
RDW: 13.2 % (ref 11.5–15.5)
WBC: 5.2 10*3/uL (ref 4.0–10.5)
nRBC: 0 % (ref 0.0–0.2)

## 2019-06-28 MED ORDER — SODIUM CHLORIDE 0.9 % IV SOLN
600.0000 mg | Freq: Every day | INTRAVENOUS | Status: DC
  Administered 2019-06-28: 600 mg via INTRAVENOUS
  Filled 2019-06-28 (×2): qty 12

## 2019-06-28 NOTE — Progress Notes (Addendum)
ID PROGRESS NOTE  Patient still persistently febrile over the last 24hrs. WBC is stable at 5. On exam, by dr Bonner Puna, no prostatic discomfort.  Imaging of mr abdomen - did not suggest pyelonephritis CT of cervical spine -did show some abnormality/lucency of C3 however radiology felt not consistent with infection   64yo M with enterococcal sepsis of unknown etiology  + urine cx also but patient denied any urinary symptoms - not convinced that this is an ascending infection where its started first in urinary system then had secondary bacteremia  Due to persistent fever, possibly has drug allergy to ampicillin. Will change to daptomycin temporarily. Will get baseline CK. Will check cbc diff, otherwise clinically stable  Alf[facility] [phone]

## 2019-06-28 NOTE — Progress Notes (Signed)
CCM notified RN that patient had 6 runs of SVT. Patient is asymptomatic, no complained of chest pain or Sob. On-call [place] made aware. Will continue to monitor.

## 2019-06-28 NOTE — Progress Notes (Signed)
YELLOW MEWS vital signs ordered every four hours. Will cont. To monitor and handoff the oncoming shift. SRP, RN

## 2019-06-28 NOTE — Progress Notes (Addendum)
PROGRESS NOTE  HARM JOU  IRC:[id number] DOB: [birth year] DOA: 06/25/2019 PCP: Rutherford Guys, MD  Outpatient Specialists: GI, Dr. [name]; Cardiology, Dr. Georgina Peer; Pulmonology, [name]  Brief Narrative: Louis [name] is a 64 y.o. male with a history of scoliosis with restrictive and obstructive lung disease, chronic HFpEF, OSA and nocturnal hypoxia on CPAP and 2L O2, essential tremor, hepatic steatosis, and perforated duodenal ulcer s/p [name] Jan 2021 who presented to the ED with increasing falls and progressive weakness. He was found to be febrile to 101.43F and tachypneic though not hypoxemic. Work up included CXR with indistinct hazy left-sided opacity and subsequent CTA chest after d-dimer was 1.90 which had motion degradation without PE or focal consolidation. There were incidental findings including renal lesions for which abdominal MR was recommended. Multiple metabolic derangements including hyponatremia (125), hyperkalemia (5.8) acute renal failure (SCr 1.99 from baseline of 1), and mild LFT elevations. BNP 229.1 and troponin negative. Hgb 10.4g/dl, and platelets 133. Urine micro showed many bacteria and hyaline casts without RBCs or WBCs. Ceftriaxone and 500cc NS given, blood and urine cultures sent, neurology consulted, and hospitalists consulted for admission.  Blood cultures (2 of 4 bottles) have grown Enterococcus for which ampicillin is started, ID consulted, and TEE is planned. Urine culture has also grown E. faecalis. MRI abdomen was performed showing no intraabdominal abscess, and benign-appearing renal cysts.   Hospitalization has been complicated by diffuse severe weakness and waxing/waning delirium.  Assessment & Plan: Active Problems:   HTN (hypertension)   Anxiety state   Essential tremor   Renal artery stenosis in 1 of 2 vessels (HCC)   Transaminitis   SOB (shortness of breath)   Chronic respiratory failure with hypoxia and hypercapnia (HCC)   OSA (obstructive  sleep apnea)   Hyperkalemia   Hyponatremia   ARF (acute renal failure) (HCC)   Bacteremia due to Enterococcus  Acute renal failure: Prerenal by FENa (0.41%) with hyaline casts. Subnephrotic proteinuria nonspecific, nonoliguric. - SCr continues steady improvement. Again, no signs of overload, will continue isotonic saline.   - Trend BMP, avoid nephrotoxins.  Enterococcus bacteremia: Urinary source w/+UCx vs. descending infection with unclear source. No abscess on cross-sectional abdominal imaging. No pyelonephritis suggested. No prostate tenderness on exam to suggest prostatitis/abscess. Abnormalities on cervical CT are not at all typical of infection per radiologist I spoke with, Dr. Loletta Specter. - Ampicillin started, with persistent fever in the absence of sepsis (no tachycardia, no leukocytosis, etc.) consideration given to drug fever, so switching to daptomycin (to avoid vancomycin nephrotoxicity) and will monitor. Consideration given to cross sectional imaging, which will be deferred at this time.  - Echo without valvular vegetation, views were limted. TEE arranged for 6/28.  - Repeat Cx's 6/26 negative thus far.  Acute delirium: With negative neuroimaging, waxing/waning course. Improving overall as sepsis physiology improves.  - Delirium precautions - Treat infection and monitor  Atelectasis: Noted on personal review of CXR 6/26. - OOB as able, incentive spirometry.   Hyperkalemia: Recurrent problem, resolved by holding spironolactone and ARB.   - Continue to hold spironolactone and ARB. Would be in favor of switching back to lasix from spironolactone, though will defer to cardiology as outpatient.  - Continue cardiac monitoring.  Hyponatremia: Recurrent problem/chronic, suspect hypovolemic, isoosmolar, possibly exacerbated by EtOH.  - Continues improvement at goal rate. Continue isotonic saline.   Chronic HFpEF (LVEF wnl, G2DD), longstanding resistant HTN: BNP elevated but lower  than previous value and does not appear overloaded by  exam. No effusions or airspace opacities on CTA chest.  - Echocardiogram repeated shows improvement with LVEF 22-02%, normal diastolic parameters, normal RV size, reported mild reduction in RV apical function of uncertain significance. IVC appears normal. - Hold spironolactone and ARB - Continue metoprolol and norvasc.  - Monitor daily weights, I/O  Aortic root dilation: 60mm.  - Surveillance recommended.   Frequent falls, progressive generalized weakness: LE neuropathy contributing to fall risk.   - Neurology recommendations appreciated, CT and MRI brain unrevealing. Will continue B12 supplementation, advise alcohol cessation, and recommend close outpatient follow up for EMG/NCS.  - PT/OT consulted. CIR consulted.  Essential tremor: Worsening with ataxic features and increasing falls. ?if worsened with gabapentin in setting of renal failure as this has improved with improvement in CrCl. - Vitamin B12 (low normal), ceruloplasmin (pending), thiamine (pending), A1c (5.7%). Supplementing thiamine empirically and B12.  LFT elevation and hepatic steatosis: Hepatitis panel negative. Likely elevations due to sepsis. - Alcohol moderation counseling provided. - Trending, showing improvement.   Renal lesions: Seen incidentally on CTA chest. Benign appearance on MRI  OSA, chronic nocturnal hypoxemic respiratory failure, restrictive lung disease:  - Continue CPAP with 2L O2 qHS.   History of perforated duodenal ulcer s/p [name] Jan 2021: Subsequent EGD confirmed resolution of duodenal ulceration. H. pylori has been negative.  - Continue lifelong PPI.  - Avoid NSAIDs  Chronic hyperchromic anemia: Unclear etiology, possible AOCD with high ferritin (though likely acting as acute phase reactant) and low TIBC - Supp B12 and monitor  Thrombocytopenia: Suspect hepatic steatosis, though if improves over next few days, could have been due  to sepsis. - Continue monitoring.    Alcohol overuse: No history of withdrawal and is drinking less alcohol than previously.  - Alcohol moderation counseling performed.  - No current evidence of withdrawal.  Rounded 8 mm area of lucency within the C3 vertebral body and 9 mm sclerotic lesion within the C6 vertebral body: Incidental findings on cervical spine CT at admission. This is nonspecific. Discussed with radiology again 6/27, Dr. Loletta Specter, who states this is typical of benign etiologies and not infection. - Consider nonemergent nuclear medicine bone scan. Will not likely repeat imaging during this admission.   Aortic atherosclerosis: Noted.   Anxiety:  - Continue prn xanax as above.   History of left foot fractures s/p ORIF and recent closed nondisplaced fracture of the base of the 5th metatarsal. - WBAT in CAM boot, PT/OT  DVT prophylaxis: Heparin [place] Code Status: Full Family Communication: Spouse, daughter at bedside Disposition Plan:  Status is: Inpatient  Remains inpatient appropriate because:Ongoing diagnostic testing needed not appropriate for outpatient work up and IV treatments appropriate due to intensity of illness or inability to take PO  Dispo: The patient is from: Home              Anticipated d/c is to: TBD: CIR consulted              Anticipated d/c date is: > 3 days              Patient currently is not medically stable to d/c.  Consultants:   ID  Cardiology for TEE  Radiology by phone  Procedures:   TEE planned 6/28  Antimicrobials:  Ceftriaxone 6/24  Ampicillin 6/25   Daptomycin 6/27  Subjective: More lucid today, troubled by ongoing weakness in legs. Having intermittent fevers still with fewer visual disturbances. Again, confirms that he does not currently and has not recently  had back pain, dysuria, rectal pain or flank pain.  Objective: Vitals:   06/28/19 0524 06/28/19 0838 06/28/19 1018 06/28/19 1422  BP:  (!) 151/91 119/78 109/87    Pulse:  85 73 74  Resp:  16 16   Temp:  (!) 102.8 F (39.3 C) 99.6 F (37.6 C) 99.4 F (37.4 C)  TempSrc:   Axillary Axillary  SpO2:  95% 97% 94%  Weight: 100.1 kg       Intake/Output Summary (Last 24 hours) at 06/28/2019 1800 Last data filed at 06/28/2019 1512 Gross per 24 hour  Intake 480 ml  Output 900 ml  Net -420 ml   Filed Weights   06/27/19 0500 06/28/19 0524  Weight: 94.6 kg 100.1 kg   Gen: 64 y.o. male in no distress Pulm: Nonlabored breathing room air. Clear. CV: Regular rate and rhythm. No murmur, rub, or gallop. No JVD, no significant dependent edema. GI: Abdomen soft, protuberant but non-tender, non-distended, with normoactive bowel sounds.  DRE:  Prostate mildly enlarged, not at all tender, not boggy, no gross rectal bleeding/melena. Normal tone. Ext: Warm, no deformities Skin: No rashes, lesions or ulcers on visualized skin. Neuro: Alert and oriented. No asterixis. No focal neurological deficits. Psych: Judgement and insight appear fair/improved. Mood euthymic & affect congruent. Behavior is appropriate.    Data Reviewed: I have personally reviewed following labs and imaging studies  CBC: Recent Labs  Lab 06/25/19 1144 06/26/19 0620 06/27/19 1122 06/28/19 0517  WBC 6.1 6.9 6.7 5.2  NEUTROABS 5.5  --   --  3.6  HGB 10.4* 9.5* 9.3* 8.7*  HCT 30.0* 28.2* 27.7* 26.5*  MCV 99.7 102.2* 103.7* 103.9*  PLT 133* 122* 118* 076*   Basic Metabolic Panel: Recent Labs  Lab 06/25/19 1335 06/26/19 0620 06/27/19 1122 06/28/19 0517  NA 125* 126* 131* 133*  K 5.8* 4.4 4.3 4.0  CL 87* 89* 93* 99  CO2 27 23 22 24   GLUCOSE 130* 104* 109* 95  BUN 33* 29* 29* 23  CREATININE 1.99* 1.65* 1.57* 1.36*  CALCIUM 9.1 8.6* 8.2* 8.0*   GFR: Estimated Creatinine Clearance: 67.9 mL/min (A) (by C-G formula based on SCr of 1.36 mg/dL (H)). Liver Function Tests: Recent Labs  Lab 06/25/19 1335 06/26/19 0620 06/27/19 1122 06/28/19 0517  AST 68* 268* 125* 76*  ALT 57*  116* 97* 70*  ALKPHOS 90 70 50 44  BILITOT 0.8 0.8 0.7 0.8  PROT 7.7 7.5 7.0 6.2*  ALBUMIN 4.0 3.8 3.2* 2.8*   No results for input(s): LIPASE, AMYLASE in the last 168 hours. Recent Labs  Lab 06/25/19 2122  AMMONIA 25   Coagulation Profile: No results for input(s): INR, PROTIME in the last 168 hours. Cardiac Enzymes: Recent Labs  Lab 06/25/19 1335  CKTOTAL 344   BNP (last 3 results) No results for input(s): PROBNP in the last 8760 hours. HbA1C: Recent Labs    06/26/19 0620  HGBA1C 5.7*   CBG: Recent Labs  Lab 06/25/19 1226  GLUCAP 140*   Lipid Profile: No results for input(s): CHOL, HDL, LDLCALC, TRIG, CHOLHDL, LDLDIRECT in the last 72 hours. Thyroid Function Tests: No results for input(s): TSH, T4TOTAL, FREET4, T3FREE, THYROIDAB in the last 72 hours. Anemia Panel: Recent Labs    06/25/19 2122  VITAMINB12 237  FOLATE 8.4  FERRITIN 2,333*  TIBC 209*  IRON 30*  RETICCTPCT 1.4   Urine analysis:    Component Value Date/Time   COLORURINE YELLOW 06/25/2019 [place] 06/25/2019 1144  APPEARANCEUR Clear 02/27/2017 1555   LABSPEC 1.011 06/25/2019 1144   PHURINE 5.0 06/25/2019 1144   GLUCOSEU >=500 (A) 06/25/2019 1144   HGBUR SMALL (A) 06/25/2019 1144   BILIRUBINUR NEGATIVE 06/25/2019 1144   BILIRUBINUR Negative 02/27/2017 1555   KETONESUR 5 (A) 06/25/2019 1144   PROTEINUR NEGATIVE 06/25/2019 1144   NITRITE NEGATIVE 06/25/2019 1144   LEUKOCYTESUR NEGATIVE 06/25/2019 1144   Recent Results (from the past 240 hour(s))  Urine culture     Status: Abnormal   Collection Time: 06/25/19 12:02 PM   Specimen: Urine, Clean Catch  Result Value Ref Range Status   Specimen Description   Final    URINE, CLEAN CATCH Performed at [facility], [place] [address] [place], [place] [address]    Special Requests   Final    NONE Performed at [facility], [place] [address] [place], [place] [address]    Culture >=100,000  COLONIES/mL ENTEROCOCCUS FAECALIS (A)  Final   Report Status 06/28/2019 FINAL  Final   Organism ID, Bacteria ENTEROCOCCUS FAECALIS (A)  Final      Susceptibility   Enterococcus faecalis - MIC*    AMPICILLIN <=2 SENSITIVE Sensitive     NITROFURANTOIN <=16 SENSITIVE Sensitive     VANCOMYCIN 1 SENSITIVE Sensitive     * >=100,000 COLONIES/mL ENTEROCOCCUS FAECALIS  Culture, blood (routine x 2)     Status: Abnormal   Collection Time: 06/25/19  1:08 PM   Specimen: BLOOD  Result Value Ref Range Status   Specimen Description   Final    BLOOD RIGHT ANTECUBITAL Performed at [facility], [place] [address] [place], [place] [address]    Special Requests   Final    BOTTLES DRAWN AEROBIC AND ANAEROBIC Blood Culture adequate volume Performed at [place] [address] [place], [place] [address]    Culture  Setup Time   Final    GRAM POSITIVE COCCI IN BOTH AEROBIC AND ANAEROBIC BOTTLES CRITICAL RESULT CALLED TO, READ BACK BY AND VERIFIED WITH: M. RENZ PHARMD, AT [id number] 06/26/19 BY Rush Landmark Performed at [facility], [place] [address] [place], [place] [address]    Culture ENTEROCOCCUS FAECALIS (A)  Final   Report Status 06/28/2019 FINAL  Final   Organism ID, Bacteria ENTEROCOCCUS FAECALIS  Final      Susceptibility   Enterococcus faecalis - MIC*    AMPICILLIN <=2 SENSITIVE Sensitive     VANCOMYCIN 1 SENSITIVE Sensitive     GENTAMICIN SYNERGY SENSITIVE Sensitive     * ENTEROCOCCUS FAECALIS  SARS Coronavirus 2 by RT PCR (hospital order, performed in [place] hospital lab) Nasopharyngeal Nasopharyngeal Swab     Status: None   Collection Time: 06/25/19  1:08 PM   Specimen: Nasopharyngeal Swab  Result Value Ref Range Status   SARS Coronavirus 2 NEGATIVE NEGATIVE Final    Comment: (NOTE) SARS-CoV-2 target nucleic acids are NOT DETECTED.  The SARS-CoV-2 RNA is generally detectable in upper and lower respiratory specimens during the acute phase of infection. The  lowest concentration of SARS-CoV-2 viral copies this assay can detect is 250 copies / mL. A negative result does not preclude SARS-CoV-2 infection and should not be used as the sole basis for treatment or other patient management decisions.  A negative result may occur with improper specimen collection / handling, submission of specimen other than nasopharyngeal swab, presence of viral mutation(s) within the areas targeted by this assay, and inadequate number of viral copies (<250  copies / mL). A negative result must be combined with clinical observations, patient history, and epidemiological information.  Fact Sheet for Patients:   StrictlyIdeas.no  Fact Sheet for Healthcare Providers: BankingDealers.co.za  This test is not yet approved or  cleared by the [place] FDA and has been authorized for detection and/or diagnosis of SARS-CoV-2 by FDA under an Emergency Use Authorization (EUA).  This EUA will remain in effect (meaning this test can be used) for the duration of the COVID-19 declaration under Section 564(b)(1) of the Act, 21 U.S.C. section 360bbb-3(b)(1), unless the authorization is terminated or revoked sooner.  Performed at [facility], [place] [address] [place], [place] [address]   Blood Culture ID Panel (Reflexed)     Status: Abnormal   Collection Time: 06/25/19  1:08 PM  Result Value Ref Range Status   Enterococcus species DETECTED (A) NOT DETECTED Final    Comment: CRITICAL RESULT CALLED TO, READ BACK BY AND VERIFIED WITH: M. RENZ PHARMD, AT [id number] 06/26/19 BY D. VANHOOK    Vancomycin resistance NOT DETECTED NOT DETECTED Final   Listeria monocytogenes NOT DETECTED NOT DETECTED Final   Staphylococcus species NOT DETECTED NOT DETECTED Final   Staphylococcus aureus (BCID) NOT DETECTED NOT DETECTED Final   Streptococcus species NOT DETECTED NOT DETECTED Final   Streptococcus agalactiae NOT DETECTED NOT  DETECTED Final   Streptococcus pneumoniae NOT DETECTED NOT DETECTED Final   Streptococcus pyogenes NOT DETECTED NOT DETECTED Final   Acinetobacter baumannii NOT DETECTED NOT DETECTED Final   Enterobacteriaceae species NOT DETECTED NOT DETECTED Final   Enterobacter cloacae complex NOT DETECTED NOT DETECTED Final   Escherichia coli NOT DETECTED NOT DETECTED Final   Klebsiella oxytoca NOT DETECTED NOT DETECTED Final   Klebsiella pneumoniae NOT DETECTED NOT DETECTED Final   Proteus species NOT DETECTED NOT DETECTED Final   Serratia marcescens NOT DETECTED NOT DETECTED Final   Haemophilus influenzae NOT DETECTED NOT DETECTED Final   Neisseria meningitidis NOT DETECTED NOT DETECTED Final   Pseudomonas aeruginosa NOT DETECTED NOT DETECTED Final   Candida albicans NOT DETECTED NOT DETECTED Final   Candida glabrata NOT DETECTED NOT DETECTED Final   Candida krusei NOT DETECTED NOT DETECTED Final   Candida parapsilosis NOT DETECTED NOT DETECTED Final   Candida tropicalis NOT DETECTED NOT DETECTED Final    Comment: Performed at [facility], [place] [address] [place], [place] [address]  Culture, blood (routine x 2)     Status: None (Preliminary result)   Collection Time: 06/25/19  1:52 PM   Specimen: BLOOD LEFT HAND  Result Value Ref Range Status   Specimen Description   Final    BLOOD LEFT HAND Performed at [place] [address] [place], [place] [address]    Special Requests   Final    BOTTLES DRAWN AEROBIC AND ANAEROBIC Blood Culture adequate volume Performed at [place] [address], [place], [place] [address]    Culture   Final    NO GROWTH 3 DAYS Performed at [facility], [place] [address], [place], [place] [address]    Report Status PENDING  Incomplete  Culture, blood (routine x 2)     Status: None (Preliminary result)   Collection Time: 06/27/19 11:22 AM   Specimen: BLOOD  Result Value Ref Range Status   Specimen  Description   Final    BLOOD RIGHT HAND Performed at [place] [address], [place], [place] [address]  Special Requests   Final    BOTTLES DRAWN AEROBIC AND ANAEROBIC Blood Culture adequate volume Performed at [place] [address] [place], [place] [address]    Culture   Final    NO GROWTH 1 DAY Performed at [facility], [place] [address] [place], [place] [address]    Report Status PENDING  Incomplete  Culture, blood (routine x 2)     Status: None (Preliminary result)   Collection Time: 06/27/19 11:22 AM   Specimen: BLOOD  Result Value Ref Range Status   Specimen Description   Final    BLOOD LEFT HAND Performed at [place] [address], [place], [place] [address]    Special Requests   Final    BOTTLES DRAWN AEROBIC ONLY Blood Culture adequate volume Performed at [place] [address] [place], [place] [address]    Culture   Final    NO GROWTH 1 DAY Performed at [facility], [place] [address] [place], [place] [address]    Report Status PENDING  Incomplete      Radiology Studies: MR ABDOMEN W WO CONTRAST  Result Date: 06/27/2019 CLINICAL DATA:  Indeterminate left renal lesion on recent chest CTA. Suspected abdominal infection or abscess. Renal insufficiency. EXAM: MRI ABDOMEN WITHOUT AND WITH CONTRAST TECHNIQUE: Multiplanar multisequence MR imaging of the abdomen was performed both before and after the administration of intravenous contrast. CONTRAST:  52mL GADAVIST GADOBUTROL 1 MMOL/ML IV SOLN COMPARISON:  Chest CTA on 06/25/2019, and AP CT on 01/05/2019 FINDINGS: Lower chest: No acute findings. Hepatobiliary: No hepatic masses identified. A tiny T2 hyperintense lesion is seen in the inferior right hepatic lobe which is too small to characterize but most likely represents a tiny cyst. Marked diffuse hepatic steatosis is seen. Gallbladder is unremarkable. No evidence  of biliary ductal dilatation. Pancreas:  No mass or inflammatory changes. Spleen:  Within normal limits in size and appearance. Adrenals/Urinary Tract: Normal adrenal glands. Several simple renal cysts are seen in both kidneys. A 1 cm subcapsular lesion is seen in the posterior upper pole the left kidney which shows marked T1 hyperintensity, T2 hypointensity, and lack of contrast enhancement on subtraction imaging. This is consistent with a benign Bosniak category 2 hemorrhagic cyst and corresponds with the lesion seen recent chest CTA. This is also stable compared to earlier abdomen CT. No evidence of renal masses. Mild right renal pelvicaliectasis is stable compared to previous CT. Stomach/Bowel: Visualized portion unremarkable. Vascular/Lymphatic: No pathologically enlarged lymph nodes identified. No abdominal aortic aneurysm. Other:  None. Musculoskeletal:  No suspicious bone lesions identified. IMPRESSION: 1. No evidence of abdominal abscess or other acute findings. 2. 1 cm benign Bosniak category 2 hemorrhagic cyst in the upper pole of left kidney, which corresponds with the lesion seen on recent chest CTA. Other benign Bosniak category 1 renal cysts are also seen bilaterally. No evidence of renal neoplasm. 3. Stable mild right renal pelvicaliectasis. 4. Marked hepatic steatosis. Electronically Signed   By: Marlaine Hind M.D.   On: 06/27/2019 11:02   DG CHEST PORT 1 VIEW  Result Date: 06/27/2019 CLINICAL DATA:  Hypoxia, shortness of breath and weakness. EXAM: PORTABLE CHEST 1 VIEW COMPARISON:  06/25/2019 FINDINGS: Normal sized heart. Interval linear densities at both lung bases. Stable severe dextroconvex thoracic scoliosis. IMPRESSION: Interval bibasilar linear atelectasis. Electronically Signed   By: Claudie Revering M.D.   On: 06/27/2019 18:40    Scheduled Meds: . amLODipine  5  mg Oral Daily  . folic acid  1 mg Oral Daily  . gabapentin  300 mg Oral BID  . heparin  5,000 Units Subcutaneous Q8H  .  metoprolol succinate  150 mg Oral Daily  . multivitamin with minerals  1 tablet Oral Daily  . thiamine  100 mg Oral Daily   Continuous Infusions: . sodium chloride 75 mL/hr at 06/27/19 1754  . sodium chloride Stopped (06/26/19 2125)  . DAPTOmycin (CUBICIN)  IV 600 mg (06/28/19 1525)     LOS: 3 days   Total time spent 40 minutes in seeing, examining patient, coordinating patient care, greater than 50% of which was spent on the floor and/or at the bedside.   Patrecia Pour, MD [facility] [email or web address] 06/28/2019, 6:00 PM

## 2019-06-28 NOTE — Progress Notes (Signed)
Pharmacy Antibiotic Note  Louis [name] is a 64 y.o. male admitted on 06/25/2019 with worsening tremors and multiple falls.  Patient with + Enterococcus faecalis in urine and bacteremia and has been receiving Ampicillin 2gm IV q4h.  ID notes possible drug allergy to ampicillin due to persistent fever, and Pharmacy has been consulted for Daptomycin dosing.  Plan: Daptomycin 600mg  (6mg /kg) IV q24h Follow abx plans  Weight: 100.1 kg (220 lb 10.9 oz)  Temp (24hrs), Avg:100.4 F (38 C), Min:98 F (36.7 C), Max:102.8 F (39.3 C)  Recent Labs  Lab 06/25/19 1144 06/25/19 1308 06/25/19 1335 06/26/19 0620 06/27/19 1122 06/28/19 0517  WBC 6.1  --   --  6.9 6.7 5.2  CREATININE  --   --  1.99* 1.65* 1.57* 1.36*  LATICACIDVEN  --  1.8  --   --   --   --     Estimated Creatinine Clearance: 67.9 mL/min (A) (by C-G formula based on SCr of 1.36 mg/dL (H)).    Allergies  Allergen Reactions  . Accupril [Quinapril Hcl] Cough  . Nsaids     Perforated gastric ulcer    Antimicrobials this admission: 6/24 Ceftriaxone x 1 in ED 6/24 Ampicillin >> 6/27 6/27 Daptomycin >>  Dose adjustments this admission:   Microbiology results: 6/24 BCx: 2/4 Enterococcus faecalis 6/24 UCx: Enterococcus faecalis   Thank you for allowing pharmacy to be a part of this patient's care.  Everette Rank, PharmD 06/28/2019 1:12 PM

## 2019-06-28 NOTE — H&P (View-Only) (Signed)
PROGRESS NOTE  Louis [name]  MLY:[id number] DOB: [birth year] DOA: 06/25/2019 PCP: Louis Guys, MD  Outpatient Specialists: GI, Dr. [name]; Cardiology, Dr. Georgina Peer; Pulmonology, [name]  Brief Narrative: Louis [name] is a 64 y.o. male with a history of scoliosis with restrictive and obstructive lung disease, chronic HFpEF, OSA and nocturnal hypoxia on CPAP and 2L O2, essential tremor, hepatic steatosis, and perforated duodenal ulcer s/p [name] Jan 2021 who presented to the ED with increasing falls and progressive weakness. He was found to be febrile to 101.42F and tachypneic though not hypoxemic. Work up included CXR with indistinct hazy left-sided opacity and subsequent CTA chest after d-dimer was 1.90 which had motion degradation without PE or focal consolidation. There were incidental findings including renal lesions for which abdominal MR was recommended. Multiple metabolic derangements including hyponatremia (125), hyperkalemia (5.8) acute renal failure (SCr 1.99 from baseline of 1), and mild LFT elevations. BNP 229.1 and troponin negative. Hgb 10.4g/dl, and platelets 133. Urine micro showed many bacteria and hyaline casts without RBCs or WBCs. Ceftriaxone and 500cc NS given, blood and urine cultures sent, neurology consulted, and hospitalists consulted for admission.  Blood cultures (2 of 4 bottles) have grown Enterococcus for which ampicillin is started, ID consulted, and TEE is planned. Urine culture has also grown E. faecalis. MRI abdomen was performed showing no intraabdominal abscess, and benign-appearing renal cysts.   Hospitalization has been complicated by diffuse severe weakness and waxing/waning delirium.  Assessment & Plan: Active Problems:   HTN (hypertension)   Anxiety state   Essential tremor   Renal artery stenosis in 1 of 2 vessels (HCC)   Transaminitis   SOB (shortness of breath)   Chronic respiratory failure with hypoxia and hypercapnia (HCC)   OSA (obstructive  sleep apnea)   Hyperkalemia   Hyponatremia   ARF (acute renal failure) (HCC)   Bacteremia due to Enterococcus  Acute renal failure: Prerenal by FENa (0.41%) with hyaline casts. Subnephrotic proteinuria nonspecific, nonoliguric. - SCr continues steady improvement. Again, no signs of overload, will continue isotonic saline.   - Trend BMP, avoid nephrotoxins.  Enterococcus bacteremia: Urinary source w/+UCx vs. descending infection with unclear source. No abscess on cross-sectional abdominal imaging. No pyelonephritis suggested. No prostate tenderness on exam to suggest prostatitis/abscess. Abnormalities on cervical CT are not at all typical of infection per radiologist I spoke with, Dr. Loletta Specter. - Ampicillin started, with persistent fever in the absence of sepsis (no tachycardia, no leukocytosis, etc.) consideration given to drug fever, so switching to daptomycin (to avoid vancomycin nephrotoxicity) and will monitor. Consideration given to cross sectional imaging, which will be deferred at this time.  - Echo without valvular vegetation, views were limted. TEE arranged for 6/28.  - Repeat Cx's 6/26 negative thus far.  Acute delirium: With negative neuroimaging, waxing/waning course. Improving overall as sepsis physiology improves.  - Delirium precautions - Treat infection and monitor  Atelectasis: Noted on personal review of CXR 6/26. - OOB as able, incentive spirometry.   Hyperkalemia: Recurrent problem, resolved by holding spironolactone and ARB.   - Continue to hold spironolactone and ARB. Would be in favor of switching back to lasix from spironolactone, though will defer to cardiology as outpatient.  - Continue cardiac monitoring.  Hyponatremia: Recurrent problem/chronic, suspect hypovolemic, isoosmolar, possibly exacerbated by EtOH.  - Continues improvement at goal rate. Continue isotonic saline.   Chronic HFpEF (LVEF wnl, G2DD), longstanding resistant HTN: BNP elevated but lower  than previous value and does not appear overloaded by  exam. No effusions or airspace opacities on CTA chest.  - Echocardiogram repeated shows improvement with LVEF 08-67%, normal diastolic parameters, normal RV size, reported mild reduction in RV apical function of uncertain significance. IVC appears normal. - Hold spironolactone and ARB - Continue metoprolol and norvasc.  - Monitor daily weights, I/O  Aortic root dilation: 19mm.  - Surveillance recommended.   Frequent falls, progressive generalized weakness: LE neuropathy contributing to fall risk.   - Neurology recommendations appreciated, CT and MRI brain unrevealing. Will continue B12 supplementation, advise alcohol cessation, and recommend close outpatient follow up for EMG/NCS.  - PT/OT consulted. CIR consulted.  Essential tremor: Worsening with ataxic features and increasing falls. ?if worsened with gabapentin in setting of renal failure as this has improved with improvement in CrCl. - Vitamin B12 (low normal), ceruloplasmin (pending), thiamine (pending), A1c (5.7%). Supplementing thiamine empirically and B12.  LFT elevation and hepatic steatosis: Hepatitis panel negative. Likely elevations due to sepsis. - Alcohol moderation counseling provided. - Trending, showing improvement.   Renal lesions: Seen incidentally on CTA chest. Benign appearance on MRI  OSA, chronic nocturnal hypoxemic respiratory failure, restrictive lung disease:  - Continue CPAP with 2L O2 qHS.   History of perforated duodenal ulcer s/p [name] Jan 2021: Subsequent EGD confirmed resolution of duodenal ulceration. H. pylori has been negative.  - Continue lifelong PPI.  - Avoid NSAIDs  Chronic hyperchromic anemia: Unclear etiology, possible AOCD with high ferritin (though likely acting as acute phase reactant) and low TIBC - Supp B12 and monitor  Thrombocytopenia: Suspect hepatic steatosis, though if improves over next few days, could have been due  to sepsis. - Continue monitoring.    Alcohol overuse: No history of withdrawal and is drinking less alcohol than previously.  - Alcohol moderation counseling performed.  - No current evidence of withdrawal.  Rounded 8 mm area of lucency within the C3 vertebral body and 9 mm sclerotic lesion within the C6 vertebral body: Incidental findings on cervical spine CT at admission. This is nonspecific. Discussed with radiology again 6/27, Dr. Loletta Specter, who states this is typical of benign etiologies and not infection. - Consider nonemergent nuclear medicine bone scan. Will not likely repeat imaging during this admission.   Aortic atherosclerosis: Noted.   Anxiety:  - Continue prn xanax as above.   History of left foot fractures s/p ORIF and recent closed nondisplaced fracture of the base of the 5th metatarsal. - WBAT in CAM boot, PT/OT  DVT prophylaxis: Heparin [place] Code Status: Full Family Communication: Spouse, daughter at bedside Disposition Plan:  Status is: Inpatient  Remains inpatient appropriate because:Ongoing diagnostic testing needed not appropriate for outpatient work up and IV treatments appropriate due to intensity of illness or inability to take PO  Dispo: The patient is from: Home              Anticipated d/c is to: TBD: CIR consulted              Anticipated d/c date is: > 3 days              Patient currently is not medically stable to d/c.  Consultants:   ID  Cardiology for TEE  Radiology by phone  Procedures:   TEE planned 6/28  Antimicrobials:  Ceftriaxone 6/24  Ampicillin 6/25   Daptomycin 6/27  Subjective: More lucid today, troubled by ongoing weakness in legs. Having intermittent fevers still with fewer visual disturbances. Again, confirms that he does not currently and has not recently  had back pain, dysuria, rectal pain or flank pain.  Objective: Vitals:   06/28/19 0524 06/28/19 0838 06/28/19 1018 06/28/19 1422  BP:  (!) 151/91 119/78 109/87    Pulse:  85 73 74  Resp:  16 16   Temp:  (!) 102.8 F (39.3 C) 99.6 F (37.6 C) 99.4 F (37.4 C)  TempSrc:   Axillary Axillary  SpO2:  95% 97% 94%  Weight: 100.1 kg       Intake/Output Summary (Last 24 hours) at 06/28/2019 1800 Last data filed at 06/28/2019 1512 Gross per 24 hour  Intake 480 ml  Output 900 ml  Net -420 ml   Filed Weights   06/27/19 0500 06/28/19 0524  Weight: 94.6 kg 100.1 kg   Gen: 64 y.o. male in no distress Pulm: Nonlabored breathing room air. Clear. CV: Regular rate and rhythm. No murmur, rub, or gallop. No JVD, no significant dependent edema. GI: Abdomen soft, protuberant but non-tender, non-distended, with normoactive bowel sounds.  DRE:  Prostate mildly enlarged, not at all tender, not boggy, no gross rectal bleeding/melena. Normal tone. Ext: Warm, no deformities Skin: No rashes, lesions or ulcers on visualized skin. Neuro: Alert and oriented. No asterixis. No focal neurological deficits. Psych: Judgement and insight appear fair/improved. Mood euthymic & affect congruent. Behavior is appropriate.    Data Reviewed: I have personally reviewed following labs and imaging studies  CBC: Recent Labs  Lab 06/25/19 1144 06/26/19 0620 06/27/19 1122 06/28/19 0517  WBC 6.1 6.9 6.7 5.2  NEUTROABS 5.5  --   --  3.6  HGB 10.4* 9.5* 9.3* 8.7*  HCT 30.0* 28.2* 27.7* 26.5*  MCV 99.7 102.2* 103.7* 103.9*  PLT 133* 122* 118* 096*   Basic Metabolic Panel: Recent Labs  Lab 06/25/19 1335 06/26/19 0620 06/27/19 1122 06/28/19 0517  NA 125* 126* 131* 133*  K 5.8* 4.4 4.3 4.0  CL 87* 89* 93* 99  CO2 27 23 22 24   GLUCOSE 130* 104* 109* 95  BUN 33* 29* 29* 23  CREATININE 1.99* 1.65* 1.57* 1.36*  CALCIUM 9.1 8.6* 8.2* 8.0*   GFR: Estimated Creatinine Clearance: 67.9 mL/min (A) (by C-G formula based on SCr of 1.36 mg/dL (H)). Liver Function Tests: Recent Labs  Lab 06/25/19 1335 06/26/19 0620 06/27/19 1122 06/28/19 0517  AST 68* 268* 125* 76*  ALT 57*  116* 97* 70*  ALKPHOS 90 70 50 44  BILITOT 0.8 0.8 0.7 0.8  PROT 7.7 7.5 7.0 6.2*  ALBUMIN 4.0 3.8 3.2* 2.8*   No results for input(s): LIPASE, AMYLASE in the last 168 hours. Recent Labs  Lab 06/25/19 2122  AMMONIA 25   Coagulation Profile: No results for input(s): INR, PROTIME in the last 168 hours. Cardiac Enzymes: Recent Labs  Lab 06/25/19 1335  CKTOTAL 344   BNP (last 3 results) No results for input(s): PROBNP in the last 8760 hours. HbA1C: Recent Labs    06/26/19 0620  HGBA1C 5.7*   CBG: Recent Labs  Lab 06/25/19 1226  GLUCAP 140*   Lipid Profile: No results for input(s): CHOL, HDL, LDLCALC, TRIG, CHOLHDL, LDLDIRECT in the last 72 hours. Thyroid Function Tests: No results for input(s): TSH, T4TOTAL, FREET4, T3FREE, THYROIDAB in the last 72 hours. Anemia Panel: Recent Labs    06/25/19 2122  VITAMINB12 237  FOLATE 8.4  FERRITIN 2,333*  TIBC 209*  IRON 30*  RETICCTPCT 1.4   Urine analysis:    Component Value Date/Time   COLORURINE YELLOW 06/25/2019 [place] 06/25/2019 1144  APPEARANCEUR Clear 02/27/2017 1555   LABSPEC 1.011 06/25/2019 1144   PHURINE 5.0 06/25/2019 1144   GLUCOSEU >=500 (A) 06/25/2019 1144   HGBUR SMALL (A) 06/25/2019 1144   BILIRUBINUR NEGATIVE 06/25/2019 1144   BILIRUBINUR Negative 02/27/2017 1555   KETONESUR 5 (A) 06/25/2019 1144   PROTEINUR NEGATIVE 06/25/2019 1144   NITRITE NEGATIVE 06/25/2019 1144   LEUKOCYTESUR NEGATIVE 06/25/2019 1144   Recent Results (from the past 240 hour(s))  Urine culture     Status: Abnormal   Collection Time: 06/25/19 12:02 PM   Specimen: Urine, Clean Catch  Result Value Ref Range Status   Specimen Description   Final    URINE, CLEAN CATCH Performed at [facility], [place] [address], [place], [place] [address]    Special Requests   Final    NONE Performed at [facility], [place] [address] [place], [place] [address]    Culture >=100,000  COLONIES/mL ENTEROCOCCUS FAECALIS (A)  Final   Report Status 06/28/2019 FINAL  Final   Organism ID, Bacteria ENTEROCOCCUS FAECALIS (A)  Final      Susceptibility   Enterococcus faecalis - MIC*    AMPICILLIN <=2 SENSITIVE Sensitive     NITROFURANTOIN <=16 SENSITIVE Sensitive     VANCOMYCIN 1 SENSITIVE Sensitive     * >=100,000 COLONIES/mL ENTEROCOCCUS FAECALIS  Culture, blood (routine x 2)     Status: Abnormal   Collection Time: 06/25/19  1:08 PM   Specimen: BLOOD  Result Value Ref Range Status   Specimen Description   Final    BLOOD RIGHT ANTECUBITAL Performed at [facility], [place] [address], [place], [place] [address]    Special Requests   Final    BOTTLES DRAWN AEROBIC AND ANAEROBIC Blood Culture adequate volume Performed at [place] [address], [place], [place] [address]    Culture  Setup Time   Final    GRAM POSITIVE COCCI IN BOTH AEROBIC AND ANAEROBIC BOTTLES CRITICAL RESULT CALLED TO, READ BACK BY AND VERIFIED WITH: M. RENZ PHARMD, AT 2376 06/26/19 BY Rush Landmark Performed at [facility], [place] [address], [place], [place] [address]    Culture ENTEROCOCCUS FAECALIS (A)  Final   Report Status 06/28/2019 FINAL  Final   Organism ID, Bacteria ENTEROCOCCUS FAECALIS  Final      Susceptibility   Enterococcus faecalis - MIC*    AMPICILLIN <=2 SENSITIVE Sensitive     VANCOMYCIN 1 SENSITIVE Sensitive     GENTAMICIN SYNERGY SENSITIVE Sensitive     * ENTEROCOCCUS FAECALIS  SARS Coronavirus 2 by RT PCR (hospital order, performed in [place] hospital lab) Nasopharyngeal Nasopharyngeal Swab     Status: None   Collection Time: 06/25/19  1:08 PM   Specimen: Nasopharyngeal Swab  Result Value Ref Range Status   SARS Coronavirus 2 NEGATIVE NEGATIVE Final    Comment: (NOTE) SARS-CoV-2 target nucleic acids are NOT DETECTED.  The SARS-CoV-2 RNA is generally detectable in upper and lower respiratory specimens during the acute phase of infection. The  lowest concentration of SARS-CoV-2 viral copies this assay can detect is 250 copies / mL. A negative result does not preclude SARS-CoV-2 infection and should not be used as the sole basis for treatment or other patient management decisions.  A negative result may occur with improper specimen collection / handling, submission of specimen other than nasopharyngeal swab, presence of viral mutation(s) within the areas targeted by this assay, and inadequate number of viral copies (<250  copies / mL). A negative result must be combined with clinical observations, patient history, and epidemiological information.  Fact Sheet for Patients:   StrictlyIdeas.no  Fact Sheet for Healthcare Providers: BankingDealers.co.za  This test is not yet approved or  cleared by the [place] FDA and has been authorized for detection and/or diagnosis of SARS-CoV-2 by FDA under an Emergency Use Authorization (EUA).  This EUA will remain in effect (meaning this test can be used) for the duration of the COVID-19 declaration under Section 564(b)(1) of the Act, 21 U.S.C. section 360bbb-3(b)(1), unless the authorization is terminated or revoked sooner.  Performed at [facility], [place] [address] [place], [place] [address]   Blood Culture ID Panel (Reflexed)     Status: Abnormal   Collection Time: 06/25/19  1:08 PM  Result Value Ref Range Status   Enterococcus species DETECTED (A) NOT DETECTED Final    Comment: CRITICAL RESULT CALLED TO, READ BACK BY AND VERIFIED WITH: M. RENZ PHARMD, AT 3762 06/26/19 BY D. VANHOOK    Vancomycin resistance NOT DETECTED NOT DETECTED Final   Listeria monocytogenes NOT DETECTED NOT DETECTED Final   Staphylococcus species NOT DETECTED NOT DETECTED Final   Staphylococcus aureus (BCID) NOT DETECTED NOT DETECTED Final   Streptococcus species NOT DETECTED NOT DETECTED Final   Streptococcus agalactiae NOT DETECTED NOT  DETECTED Final   Streptococcus pneumoniae NOT DETECTED NOT DETECTED Final   Streptococcus pyogenes NOT DETECTED NOT DETECTED Final   Acinetobacter baumannii NOT DETECTED NOT DETECTED Final   Enterobacteriaceae species NOT DETECTED NOT DETECTED Final   Enterobacter cloacae complex NOT DETECTED NOT DETECTED Final   Escherichia coli NOT DETECTED NOT DETECTED Final   Klebsiella oxytoca NOT DETECTED NOT DETECTED Final   Klebsiella pneumoniae NOT DETECTED NOT DETECTED Final   Proteus species NOT DETECTED NOT DETECTED Final   Serratia marcescens NOT DETECTED NOT DETECTED Final   Haemophilus influenzae NOT DETECTED NOT DETECTED Final   Neisseria meningitidis NOT DETECTED NOT DETECTED Final   Pseudomonas aeruginosa NOT DETECTED NOT DETECTED Final   Candida albicans NOT DETECTED NOT DETECTED Final   Candida glabrata NOT DETECTED NOT DETECTED Final   Candida krusei NOT DETECTED NOT DETECTED Final   Candida parapsilosis NOT DETECTED NOT DETECTED Final   Candida tropicalis NOT DETECTED NOT DETECTED Final    Comment: Performed at [facility], [place] [address] [place], [place] [address]  Culture, blood (routine x 2)     Status: None (Preliminary result)   Collection Time: 06/25/19  1:52 PM   Specimen: BLOOD LEFT HAND  Result Value Ref Range Status   Specimen Description   Final    BLOOD LEFT HAND Performed at [place] [address] [place], [place] [address]    Special Requests   Final    BOTTLES DRAWN AEROBIC AND ANAEROBIC Blood Culture adequate volume Performed at [place] [address] [place], [place] [address]    Culture   Final    NO GROWTH 3 DAYS Performed at [facility], [place] [address], [place], [place] [address]    Report Status PENDING  Incomplete  Culture, blood (routine x 2)     Status: None (Preliminary result)   Collection Time: 06/27/19 11:22 AM   Specimen: BLOOD  Result Value Ref Range Status   Specimen  Description   Final    BLOOD RIGHT HAND Performed at [facility] [facility] [address], [place], [place] [address]  Special Requests   Final    BOTTLES DRAWN AEROBIC AND ANAEROBIC Blood Culture adequate volume Performed at [place] [address], [place], [place] [address]    Culture   Final    NO GROWTH 1 DAY Performed at [facility], [place] [address] [place], [place] [address]    Report Status PENDING  Incomplete  Culture, blood (routine x 2)     Status: None (Preliminary result)   Collection Time: 06/27/19 11:22 AM   Specimen: BLOOD  Result Value Ref Range Status   Specimen Description   Final    BLOOD LEFT HAND Performed at [facility] [address], [place], [place] [address]    Special Requests   Final    BOTTLES DRAWN AEROBIC ONLY Blood Culture adequate volume Performed at [place] [address], [place],  [address]    Culture   Final    NO GROWTH 1 DAY Performed at [facility], [place] [address], [place],  [address]    Report Status PENDING  Incomplete      Radiology Studies: MR ABDOMEN W WO CONTRAST  Result Date: 06/27/2019 CLINICAL DATA:  Indeterminate left renal lesion on recent chest CTA. Suspected abdominal infection or abscess. Renal insufficiency. EXAM: MRI ABDOMEN WITHOUT AND WITH CONTRAST TECHNIQUE: Multiplanar multisequence MR imaging of the abdomen was performed both before and after the administration of intravenous contrast. CONTRAST:  34mL GADAVIST GADOBUTROL 1 MMOL/ML IV SOLN COMPARISON:  Chest CTA on 06/25/2019, and AP CT on 01/05/2019 FINDINGS: Lower chest: No acute findings. Hepatobiliary: No hepatic masses identified. A tiny T2 hyperintense lesion is seen in the inferior right hepatic lobe which is too small to characterize but most likely represents a tiny cyst. Marked diffuse hepatic steatosis is seen. Gallbladder is unremarkable. No evidence  of biliary ductal dilatation. Pancreas:  No mass or inflammatory changes. Spleen:  Within normal limits in size and appearance. Adrenals/Urinary Tract: Normal adrenal glands. Several simple renal cysts are seen in both kidneys. A 1 cm subcapsular lesion is seen in the posterior upper pole the left kidney which shows marked T1 hyperintensity, T2 hypointensity, and lack of contrast enhancement on subtraction imaging. This is consistent with a benign Bosniak category 2 hemorrhagic cyst and corresponds with the lesion seen recent chest CTA. This is also stable compared to earlier abdomen CT. No evidence of renal masses. Mild right renal pelvicaliectasis is stable compared to previous CT. Stomach/Bowel: Visualized portion unremarkable. Vascular/Lymphatic: No pathologically enlarged lymph nodes identified. No abdominal aortic aneurysm. Other:  None. Musculoskeletal:  No suspicious bone lesions identified. IMPRESSION: 1. No evidence of abdominal abscess or other acute findings. 2. 1 cm benign Bosniak category 2 hemorrhagic cyst in the upper pole of left kidney, which corresponds with the lesion seen on recent chest CTA. Other benign Bosniak category 1 renal cysts are also seen bilaterally. No evidence of renal neoplasm. 3. Stable mild right renal pelvicaliectasis. 4. Marked hepatic steatosis. Electronically Signed   By: Marlaine Hind M.D.   On: 06/27/2019 11:02   DG CHEST PORT 1 VIEW  Result Date: 06/27/2019 CLINICAL DATA:  Hypoxia, shortness of breath and weakness. EXAM: PORTABLE CHEST 1 VIEW COMPARISON:  06/25/2019 FINDINGS: Normal sized heart. Interval linear densities at both lung bases. Stable severe dextroconvex thoracic scoliosis. IMPRESSION: Interval bibasilar linear atelectasis. Electronically Signed   By: Claudie Revering M.D.   On: 06/27/2019 18:40    Scheduled Meds: . amLODipine  5  mg Oral Daily  . folic acid  1 mg Oral Daily  . gabapentin  300 mg Oral BID  . heparin  5,000 Units Subcutaneous Q8H  .  metoprolol succinate  150 mg Oral Daily  . multivitamin with minerals  1 tablet Oral Daily  . thiamine  100 mg Oral Daily   Continuous Infusions: . sodium chloride 75 mL/hr at 06/27/19 1754  . sodium chloride Stopped (06/26/19 2125)  . DAPTOmycin (CUBICIN)  IV 600 mg (06/28/19 1525)     LOS: 3 days   Total time spent 40 minutes in seeing, examining patient, coordinating patient care, greater than 50% of which was spent on the floor and/or at the bedside.   Patrecia Pour, MD [facility] [email or web address] 06/28/2019, 6:00 PM

## 2019-06-29 ENCOUNTER — Inpatient Hospital Stay (HOSPITAL_COMMUNITY): Payer: 59 | Admitting: Anesthesiology

## 2019-06-29 ENCOUNTER — Encounter (HOSPITAL_COMMUNITY)
Disposition: A | Discharge status: Rehabilitation Facility | Payer: Self-pay | Source: Home / Self Care | Attending: Family Medicine

## 2019-06-29 ENCOUNTER — Ambulatory Visit: Payer: 59 | Admitting: Family Medicine

## 2019-06-29 ENCOUNTER — Inpatient Hospital Stay (HOSPITAL_COMMUNITY): Payer: 59

## 2019-06-29 ENCOUNTER — Encounter (HOSPITAL_COMMUNITY): Payer: Self-pay | Admitting: Family Medicine

## 2019-06-29 DIAGNOSIS — I340 Nonrheumatic mitral (valve) insufficiency: Secondary | ICD-10-CM

## 2019-06-29 DIAGNOSIS — R7881 Bacteremia: Secondary | ICD-10-CM

## 2019-06-29 HISTORY — PX: TEE WITHOUT CARDIOVERSION: SHX5443

## 2019-06-29 LAB — CBC
HCT: 26.5 % — ABNORMAL LOW (ref 39.0–52.0)
Hemoglobin: 8.6 g/dL — ABNORMAL LOW (ref 13.0–17.0)
MCH: 34.1 pg — ABNORMAL HIGH (ref 26.0–34.0)
MCHC: 32.5 g/dL (ref 30.0–36.0)
MCV: 105.2 fL — ABNORMAL HIGH (ref 80.0–100.0)
Platelets: 144 10*3/uL — ABNORMAL LOW (ref 150–400)
RBC: 2.52 MIL/uL — ABNORMAL LOW (ref 4.22–5.81)
RDW: 13.2 % (ref 11.5–15.5)
WBC: 6.5 10*3/uL (ref 4.0–10.5)
nRBC: 0 % (ref 0.0–0.2)

## 2019-06-29 LAB — COMPREHENSIVE METABOLIC PANEL
ALT: 62 U/L — ABNORMAL HIGH (ref 0–44)
AST: 68 U/L — ABNORMAL HIGH (ref 15–41)
Albumin: 2.6 g/dL — ABNORMAL LOW (ref 3.5–5.0)
Alkaline Phosphatase: 53 U/L (ref 38–126)
Anion gap: 7 (ref 5–15)
BUN: 19 mg/dL (ref 8–23)
CO2: 27 mmol/L (ref 22–32)
Calcium: 8 mg/dL — ABNORMAL LOW (ref 8.9–10.3)
Chloride: 100 mmol/L (ref 98–111)
Creatinine, Ser: 1.3 mg/dL — ABNORMAL HIGH (ref 0.61–1.24)
GFR calc Af Amer: >60 mL/min (ref >60)
GFR calc non Af Amer: 58 mL/min — ABNORMAL LOW (ref >60)
Glucose, Bld: 107 mg/dL — ABNORMAL HIGH (ref 70–99)
Potassium: 3.7 mmol/L (ref 3.5–5.1)
Sodium: 134 mmol/L — ABNORMAL LOW (ref 135–145)
Total Bilirubin: 0.8 mg/dL (ref 0.3–1.2)
Total Protein: 6.1 g/dL — ABNORMAL LOW (ref 6.5–8.1)

## 2019-06-29 LAB — CERULOPLASMIN

## 2019-06-29 SURGERY — ECHOCARDIOGRAM, TRANSESOPHAGEAL
Anesthesia: Monitor Anesthesia Care

## 2019-06-29 MED ORDER — OMEPRAZOLE 20 MG PO CPDR
20.0000 mg | DELAYED_RELEASE_CAPSULE | Freq: Every day | ORAL | Status: DC
  Administered 2019-06-29 – 2019-07-01 (×3): 20 mg via ORAL
  Filled 2019-06-29 (×5): qty 1

## 2019-06-29 MED ORDER — ENSURE ENLIVE PO LIQD
237.0000 mL | Freq: Two times a day (BID) | ORAL | Status: DC
  Administered 2019-06-30: 237 mL via ORAL

## 2019-06-29 MED ORDER — PANTOPRAZOLE SODIUM 40 MG IV SOLR: 40.0000 mg | Freq: Two times a day (BID) | INTRAVENOUS | Status: DC

## 2019-06-29 MED ORDER — BOOST / RESOURCE BREEZE PO LIQD CUSTOM
1.0000 | Freq: Three times a day (TID) | ORAL | Status: DC
  Administered 2019-06-29: 1 via ORAL

## 2019-06-29 MED ORDER — SODIUM CHLORIDE 0.9 % IV SOLN
700.0000 mg | Freq: Every day | INTRAVENOUS | Status: DC
  Administered 2019-06-29 – 2019-06-30 (×2): 700 mg via INTRAVENOUS
  Filled 2019-06-29 (×2): qty 14

## 2019-06-29 MED ORDER — PROPOFOL 10 MG/ML IV BOLUS
INTRAVENOUS | Status: DC | PRN
  Administered 2019-06-29 (×2): 10 mg via INTRAVENOUS
  Administered 2019-06-29: 20 mg via INTRAVENOUS
  Administered 2019-06-29: 30 mg via INTRAVENOUS

## 2019-06-29 MED ORDER — THIAMINE HCL 100 MG PO TABS
100.0000 mg | ORAL_TABLET | Freq: Every day | ORAL | Status: DC
  Administered 2019-07-02 – 2019-07-07 (×6): 100 mg via ORAL
  Filled 2019-06-29 (×6): qty 1

## 2019-06-29 MED ORDER — THIAMINE HCL 100 MG/ML IJ SOLN
500.0000 mg | Freq: Three times a day (TID) | INTRAVENOUS | Status: AC
  Administered 2019-06-29 – 2019-07-01 (×9): 500 mg via INTRAVENOUS
  Filled 2019-06-29 (×11): qty 5

## 2019-06-29 MED ORDER — NON FORMULARY: Freq: Every day | Status: DC

## 2019-06-29 MED ORDER — LIDOCAINE 2% (20 MG/ML) 5 ML SYRINGE
INTRAMUSCULAR | Status: DC | PRN
  Administered 2019-06-29: 80 mg via INTRAVENOUS

## 2019-06-29 NOTE — Progress Notes (Signed)
  Echocardiogram Echocardiogram Transesophageal has been performed.  Louis [name] 06/29/2019, 12:57 PM

## 2019-06-29 NOTE — Progress Notes (Addendum)
[facility] for Infectious Disease    Date of Admission:  06/25/2019   Total days of antibiotics 5/day 2 daptomycin           ID: Louis [name] is a 64 y.o. male with  Enterococcal bacteremia and UTI Active Problems:   HTN (hypertension)   Anxiety state   Essential tremor   Renal artery stenosis in 1 of 2 vessels (HCC)   Transaminitis   SOB (shortness of breath)   Chronic respiratory failure with hypoxia and hypercapnia (HCC)   OSA (obstructive sleep apnea)   Hyperkalemia   Hyponatremia   ARF (acute renal failure) ([place])   Bacteremia due to Enterococcus    Subjective: Afebrile today but did have fever up to 101F last night. Underwent TEE which was negative for vegetation. Spouse reports that the patient had diarrhea the week prior to admit, possibly could have had contamination as cause of uti.  Medications:  . amLODipine  5 mg Oral Daily  . feeding supplement  1 Container Oral TID BM  . folic acid  1 mg Oral Daily  . gabapentin  300 mg Oral BID  . heparin  5,000 Units Subcutaneous Q8H  . metoprolol succinate  150 mg Oral Daily  . multivitamin with minerals  1 tablet Oral Daily  . omeprazole  20 mg Oral Daily  . [START ON 07/02/2019] thiamine  100 mg Oral Daily    Objective: Vital signs in last 24 hours: Temp:  [98.6 F (37 C)-101.8 F (38.8 C)] 98.9 F (37.2 C) (06/28 1418) Pulse Rate:  [74-99] 76 (06/28 1418) Resp:  [25-35] 26 (06/28 1418) BP: (113-171)/(54-98) 126/79 (06/28 1418) SpO2:  [89 %-100 %] 95 % (06/28 1418) Weight:  [100 kg] 100 kg (06/28 0500) Physical Exam  Constitutional: He is oriented to person, place, and time. He appears well-developed and well-nourished. No distress.  HENT:  Mouth/Throat: Oropharynx is clear and moist. No oropharyngeal exudate.  Cardiovascular: Normal rate, regular rhythm and normal heart sounds. Exam reveals no gallop and no friction rub.  No murmur heard.  Pulmonary/Chest: Effort normal and breath sounds normal. No  respiratory distress. He has no wheezes.  Abdominal: Soft. Bowel sounds are normal. Mildly distension. There is no tenderness.  Lymphadenopathy:  He has no cervical adenopathy.  Neurological: He is alert and oriented to person, place, and time.  Skin: Skin is warm and dry. No rash noted. No erythema.  Psychiatric: He has a normal mood and affect. His behavior is normal.     Lab Results Recent Labs    06/28/19 0517 06/29/19 0509  WBC 5.2 6.5  HGB 8.7* 8.6*  HCT 26.5* 26.5*  NA 133* 134*  K 4.0 3.7  CL 99 100  CO2 24 27  BUN 23 19  CREATININE 1.36* 1.30*   Liver Panel Recent Labs    06/28/19 0517 06/29/19 0509  PROT 6.2* 6.1*  ALBUMIN 2.8* 2.6*  AST 76* 68*  ALT 70* 62*  ALKPHOS 44 53  BILITOT 0.8 0.8   Sedimentation Rate No results for input(s): ESRSEDRATE in the last 72 hours. C-Reactive Protein No results for input(s): CRP in the last 72 hours.  Microbiology: 6/26 blood cx ngtd 6/24 blood cx 1 of 2 sets + enterococcus 6/24 urine cx +enterococcus Studies/Results: DG CHEST PORT 1 VIEW  Result Date: 06/29/2019 CLINICAL DATA:  Shortness of breath EXAM: PORTABLE CHEST 1 VIEW COMPARISON:  06/27/2019 FINDINGS: Hazy opacification of the bilateral chest which is unchanged. Borderline heart  size which is distorted by scoliosis. No pneumothorax or visible effusion. IMPRESSION: Stable hazy chest opacification favoring atelectasis. Electronically Signed   By: Monte Fantasia M.D.   On: 06/29/2019 11:40   ECHO TEE  Result Date: 06/29/2019    TRANSESOPHOGEAL ECHO REPORT   Patient Name:   Louis [name] Date of Exam: 06/29/2019 Medical Rec #:  [id number]    Height:       71.0 in Accession #:    [id number]   Weight:       220.5 lb Date of Birth:  [birth year]    BSA:          2.198 m Patient Age:    73 years     BP:           158/87 mmHg Patient Gender: M            HR:           91 bpm. Exam Location:  Inpatient Procedure: Transesophageal Echo Indications:    bacteremia  History:         Patient has prior history of Echocardiogram examinations, most                 recent 06/27/2019. CHF, COPD; Risk Factors:Hypertension.  Sonographer:    Jannett Celestine RDCS (AE) Referring Phys: [address] [place]: The transesophogeal probe was passed without difficulty through the esophogus of the patient. Sedation performed by different physician. The patient developed no complications during the procedure. IMPRESSIONS  1. Left ventricular ejection fraction, by estimation, is 60 to 65%. The left ventricle has normal function. The left ventricle has no regional wall motion abnormalities.  2. Right ventricular systolic function is normal. The right ventricular size is normal.  3. No left atrial/left atrial appendage thrombus was detected.  4. The mitral valve is grossly normal. Mild to moderate mitral valve regurgitation. No evidence of mitral stenosis.  5. The aortic valve is normal in structure. Aortic valve regurgitation is trivial. No aortic stenosis is present. FINDINGS  Left Ventricle: Left ventricular ejection fraction, by estimation, is 60 to 65%. The left ventricle has normal function. The left ventricle has no regional wall motion abnormalities. The left ventricular internal cavity size was normal in size. There is  no left ventricular hypertrophy. Right Ventricle: The right ventricular size is normal. No increase in right ventricular wall thickness. Right ventricular systolic function is normal. Left Atrium: Left atrial size was normal in size. No left atrial/left atrial appendage thrombus was detected. Right Atrium: Right atrial size was normal in size. Pericardium: There is no evidence of pericardial effusion. Mitral Valve: The mitral valve is grossly normal. Mild to moderate mitral valve regurgitation. No evidence of mitral valve stenosis. There is no evidence of mitral valve vegetation. Tricuspid Valve: The tricuspid valve is grossly normal. Tricuspid valve regurgitation is mild . No evidence  of tricuspid stenosis. Aortic Valve: The aortic valve is normal in structure. Aortic valve regurgitation is trivial. No aortic stenosis is present. There is no evidence of aortic valve vegetation. Pulmonic Valve: The pulmonic valve was grossly normal. Pulmonic valve regurgitation is mild. No evidence of pulmonic stenosis. Aorta: The aortic root and ascending aorta are structurally normal, with no evidence of dilitation. IAS/Shunts: The atrial septum is grossly normal. [name] MD Electronically signed by [name] MD Signature Date/Time: 06/29/2019/3:37:18 PM    Final      Assessment/Plan: Enterococcal bacteremia = continue on IV abtx with daptomycin presently. If patient is  afebrile tonight, then can place picc line tomorrow  Deconditioning = recommend for PT to evaluate for acute rehab  Fevers = had temp high of 101F yesterday, but fever curve appears to be trending down.   [facility] Cell: [phone] Pager: [phone]  06/29/2019, 6:20 PM

## 2019-06-29 NOTE — Progress Notes (Signed)
[facility] arrived, report given.  Tranported back to [facility] [employer] [facility].

## 2019-06-29 NOTE — Anesthesia Postprocedure Evaluation (Signed)
Anesthesia Post Note  Patient: Louis [name]  Procedure(s) Performed: TRANSESOPHAGEAL ECHOCARDIOGRAM (TEE) (N/A )     Patient location during evaluation: Endoscopy Anesthesia Type: MAC Level of consciousness: awake Pain management: pain level controlled Vital Signs Assessment: post-procedure vital signs reviewed and stable Respiratory status: spontaneous breathing and patient connected to nasal cannula oxygen Cardiovascular status: stable Postop Assessment: no apparent nausea or vomiting Anesthetic complications: no   No complications documented.  Last Vitals:  Vitals:   06/29/19 1320 06/29/19 1418  BP:  126/79  Pulse: 86 76  Resp: (!) 25 (!) 26  Temp:  37.2 C  SpO2: 97% 95%    Last Pain:  Vitals:   06/29/19 1418  TempSrc: Oral  PainSc:                  Huston Foley

## 2019-06-29 NOTE — Progress Notes (Signed)
Pharmacy Antibiotic Note  Louis [name] is a 64 y.o. male admitted on 06/25/2019 with worsening tremors and multiple falls.  Patient with + Enterococcus faecalis in urine and bacteremia and has been receiving Ampicillin 2gm IV q4h.  ID notes possible drug allergy to ampicillin due to persistent fever, and Pharmacy has been consulted for Daptomycin dosing.  Today, 06/29/2019 Day 3 antibiotics  TEE unrevealing for vegetations today  SCr improving  WBC WNL  Repeat Bcx NGTD  Last fever ~3am today 101.8  6/24 (baseline) CK  = 344 (upper end normal)  Plan:  Increase Daptomycin to 700mg  (~8mg /kg per adjusted BW) IV q24h  Follow abx plans - f/u possibility of changing back to ampicillin  Follow weekly CK as long as on daptomycin (not on statin)  Height: 5\' 11"  (180.3 cm) Weight: 100 kg (220 lb 7.4 oz) IBW/kg (Calculated) : 75.3  Temp (24hrs), Avg:100 F (37.8 C), Min:98.6 F (37 C), Max:101.8 F (38.8 C)  Recent Labs  Lab 06/25/19 1144 06/25/19 1308 06/25/19 1335 06/26/19 0620 06/27/19 1122 06/28/19 0517 06/29/19 0509  WBC 6.1  --   --  6.9 6.7 5.2 6.5  CREATININE  --   --  1.99* 1.65* 1.57* 1.36* 1.30*  LATICACIDVEN  --  1.8  --   --   --   --   --     Estimated Creatinine Clearance: 71 mL/min (A) (by C-G formula based on SCr of 1.3 mg/dL (H)).    Allergies  Allergen Reactions  . Accupril [Quinapril Hcl] Cough  . Nsaids     Perforated gastric ulcer    Antimicrobials this admission: 6/24 Ceftriaxone x 1 in ED 6/24 Ampicillin >> 6/27 6/27 Daptomycin >>  Dose adjustments this admission:   Microbiology results: 6/24 BCx: 2/4 Enterococcus faecalis 6/24 UCx: Enterococcus faecalis   Thank you for allowing pharmacy to be a part of this patient's care.  Doreene Eland, PharmD, BCPS.   Work Cell: [phone] 06/29/2019 1:53 PM

## 2019-06-29 NOTE — CV Procedure (Signed)
    Transesophageal Echocardiogram Note  Louis [name] [id number] 1955-01-19  Procedure: Transesophageal Echocardiogram Indications: bacteremia   Procedure Details Consent: Obtained Time Out: Verified patient identification, verified procedure, site/side was marked, verified correct patient position, special equipment/implants available, Radiology Safety Procedures followed,  medications/allergies/relevent history reviewed, required imaging and test results available.  Performed  Medications:  During this procedure the patient is administered a propofol drip 80 mg IV total preceded by Lidocaine 80 mg    procedure. The period of conscious sedation is 30 minutes, of which I was present face-to-face 100% of this time.  Left Ventrical:  Normal LV function   Mitral Valve: normal valve.  No vegetations   Aortic Valve: normal 3 leaflet valve,  No vegetations   Tricuspid Valve: normal valve ,  No vegetations   Pulmonic Valve: normal valve ,  Trivial PI   Left Atrium/ Left atrial appendage: no thrombi   Atrial septum: no ASD or PFO by color doppler   Aorta: normal    Complications: No apparent complications Patient did tolerate procedure well.   Thayer Headings, Brooke Bonito., MD, [facility] 06/29/2019, 12:51 PM

## 2019-06-29 NOTE — Progress Notes (Signed)
Inpatient Rehab Admissions Coordinator Note:   Per therapy recommendations, pt was screened for CIR candidacy by Adelaine Roppolo, MS CCC-SLP. At this time, Pt. Appears to have functional decline and is a good candidate for CIR. Will place order for rehab consult per protocol.  Please contact me with questions.   Enda Santo, MS, CCC-SLP Rehab Admissions Coordinator  [phone] (celll) [phone] (office)  

## 2019-06-29 NOTE — Transfer of Care (Signed)
Immediate Anesthesia Transfer of Care Note  Patient: KYLEN SCHLIEP  Procedure(s) Performed: TRANSESOPHAGEAL ECHOCARDIOGRAM (TEE) (N/A )  Patient Location: PACU and Endoscopy Unit  Anesthesia Type:MAC  Level of Consciousness: awake and patient cooperative  Airway & Oxygen Therapy: Patient Spontanous Breathing and Patient connected to face mask oxygen  Post-op Assessment: Report given to RN, Post -op Vital signs reviewed and stable and generalized weakness  Post vital signs: Reviewed and stable  Last Vitals:  Vitals Value Taken Time  BP    Temp    Pulse 86 06/29/19 1254  Resp 38 06/29/19 1254  SpO2 96 % 06/29/19 1254  Vitals shown include unvalidated device data.  Last Pain:  Vitals:   06/29/19 1110  TempSrc:   PainSc: 0-No pain      Patients Stated Pain Goal: 0 (24/19/91 4445)  Complications: No complications documented.

## 2019-06-29 NOTE — Interval H&P Note (Signed)
History and Physical Interval Note:  06/29/2019 12:31 PM  Louis [name]  has presented today for surgery, with the diagnosis of BACTEREMIA.  The various methods of treatment have been discussed with the patient and family. After consideration of risks, benefits and other options for treatment, the patient has consented to  Procedure(s): TRANSESOPHAGEAL ECHOCARDIOGRAM (TEE) (N/A) as a surgical intervention.  The patient's history has been reviewed, patient examined, no change in status, stable for surgery.  I have reviewed the patient's chart and labs.  Questions were answered to the patient's satisfaction.     [name]

## 2019-06-29 NOTE — Progress Notes (Signed)
Pt. seen for CPAP h/s order, pt. was awoken while still on n/c @ 3lpm n/c, was not ready to be placed on CPAP @ this time, stated he, "would place on when ready", remains on 3 lpm n/c, double flowmeter placed in room to allow transition to CPAP if pt. places on w/o my notification with second flowmeter placed on @ 3 lpm, RT plans to recheck on rounds, RN aware @ bedside.

## 2019-06-29 NOTE — Progress Notes (Signed)
PROGRESS NOTE  Louis [name]  KGM:[id number] DOB: [birth year] DOA: 06/25/2019 PCP: Rutherford Guys, MD  Outpatient Specialists: GI, Dr. [name]; Cardiology, Dr. Georgina Peer; Pulmonology, [name]  Brief Narrative: Louis [name] is a 64 y.o. male with a history of scoliosis with restrictive and obstructive lung disease, chronic HFpEF, OSA and nocturnal hypoxia on CPAP and 2L O2, essential tremor, hepatic steatosis, and perforated duodenal ulcer s/p [name] Jan 2021 who presented to the ED with increasing falls and progressive weakness. He was found to be febrile to 101.45F and tachypneic though not hypoxemic. Work up included CXR with indistinct hazy left-sided opacity and subsequent CTA chest after d-dimer was 1.90 which had motion degradation without PE or focal consolidation. There were incidental findings including renal lesions for which abdominal MR was recommended. Multiple metabolic derangements including hyponatremia (125), hyperkalemia (5.8) acute renal failure (SCr 1.99 from baseline of 1), and mild LFT elevations. BNP 229.1 and troponin negative. Hgb 10.4g/dl, and platelets 133. Urine micro showed many bacteria and hyaline casts without RBCs or WBCs. Ceftriaxone and 500cc NS given, blood and urine cultures sent, neurology consulted, and hospitalists consulted for admission.  Blood cultures grew out Enterococcus for which ampicillin was started, ID consulted, and TEE planned. Urine culture has also grown E. faecalis. MRI abdomen was performed showing no intraabdominal abscess, and benign-appearing renal cysts.  Hospitalization has been complicated by diffuse severe weakness, recurrent fevers in the absence of sepsis, and waxing/waning delirium.  Assessment & Plan: Active Problems:   HTN (hypertension)   Anxiety state   Essential tremor   Renal artery stenosis in 1 of 2 vessels (HCC)   Transaminitis   SOB (shortness of breath)   Chronic respiratory failure with hypoxia and hypercapnia (HCC)    OSA (obstructive sleep apnea)   Hyperkalemia   Hyponatremia   ARF (acute renal failure) (HCC)   Bacteremia due to Enterococcus  Acute renal failure: Prerenal by FENa (0.41%) with hyaline casts. Subnephrotic proteinuria nonspecific, nonoliguric. - Continue gentle IVF, tolerating po. Anticipate DC IVF in 24 hrs.  - Trend BMP, avoiding nephrotoxins (e.g. vancomycin) as able.  Enterococcus bacteremia: Urinary source w/+UCx vs. descending infection with unclear source. No abscess on cross-sectional abdominal imaging. No pyelonephritis suggested. No prostate tenderness on exam to suggest prostatitis/abscess. Abnormalities on cervical CT are not at all typical of infection per radiologist I spoke with, Dr. Loletta Specter. - Ampicillin started, with persistent fever in the absence of sepsis (no tachycardia, no leukocytosis, etc.) consideration given to drug fever, so switched to daptomycin (to avoid vancomycin nephrotoxicity). Consideration given to cross sectional imaging, which will continue to be deferred at this time.  - Echo and subsequent TEE confirms no vegetations.   - Repeat Cx's 6/26 remains negative thus far.  Acute delirium: With negative neuroimaging, waxing/waning course. Improving overall as sepsis physiology improves but remains concomitant with fevers.  - Delirium precautions - Treating infection.  Atelectasis: Noted on personal review of CXR 6/26. - Emphasize OOB, incentive spirometry.  Hyperkalemia: Recurrent problem, resolved by holding spironolactone and ARB.   - Continue to hold spironolactone and ARB. Would be in favor of switching back to lasix from spironolactone, though will defer to cardiology as outpatient.  - Continue cardiac monitoring.  Hyponatremia: Recurrent problem/chronic, suspect hypovolemic, isoosmolar, possibly exacerbated by EtOH.  - Continues improvement at goal rate. Continue isotonic saline.   Chronic HFpEF (LVEF wnl, G2DD), longstanding resistant HTN: BNP  elevated but lower than previous value and does not appear overloaded by  exam. No effusions or airspace opacities on CTA chest.  - Echocardiogram showed improvement with LVEF 29-51%, normal diastolic parameters, normal RV size, reported mild reduction in RV apical function of uncertain significance. Findings confirmed on TEE. IVC appeared normal. - Holding spironolactone and ARB - Continue metoprolol and norvasc.  - Monitor daily weights, I/O  Aortic root dilation: 60mm.  - Surveillance recommended.   Frequent falls, progressive generalized weakness: LE neuropathy contributing to fall risk.   - Neurology recommendations appreciated, CT and MRI brain unrevealing. Will continue B12 supplementation, advise alcohol cessation, and recommend close outpatient follow up for EMG/NCS.  - PT/OT consulted. CIR consulted formally for disposition.  Essential tremor: Worsening with ataxic features and increasing falls. ?if worsened with gabapentin in setting of renal failure as this has improved with improvement in CrCl. - Vitamin B12 (low normal), ceruloplasmin (pending), thiamine (pending), A1c (5.7%). Supplementing thiamine empirically and B12.  LFT elevation and hepatic steatosis: Hepatitis panel negative. Likely elevations are due to sepsis and improving.  - Alcohol moderation counseling provided.  Hypoalbuminemia: Reported delayed surgical wound healing and albumin, once hydrated adequately is low.  - Supplement protein with boost per pt preference.  - Dietitian consulted - Check prealbumin.   Renal lesions: Seen incidentally on CTA chest. Benign appearance on MRI  OSA, chronic nocturnal hypoxemic respiratory failure, restrictive lung disease:  - Continue CPAP with 2L O2 qHS.   History of perforated duodenal ulcer s/p [name] Jan 2021: Subsequent EGD confirmed resolution of duodenal ulceration. H. pylori has been negative.  - Restart PPI. This inadvertently fell off medication list.  Will restart IV while NPO and protonix per formulary, though will also attempt to switch to omeprazole as this caused diarrhea in the past.  - Avoid NSAIDs  Chronic hyperchromic anemia: Unclear etiology, possible AOCD with high ferritin (though likely acting as acute phase reactant) and low TIBC - Supp B12 and monitor  Thrombocytopenia: Suspect hepatic steatosis, though if improves over next few days, could have been due to sepsis. - Continue monitoring.    Alcohol overuse: No history of withdrawal and is drinking less alcohol than previously.  - Alcohol moderation counseling performed.  - No current evidence of withdrawal.  Rounded 8 mm area of lucency within the C3 vertebral body and 9 mm sclerotic lesion within the C6 vertebral body: Incidental findings on cervical spine CT at admission. This is nonspecific. Discussed with radiology again 6/27, Dr. Loletta Specter, who states this is typical of benign etiologies and not infection. - Consider nonemergent nuclear medicine bone scan.   Aortic atherosclerosis: Noted.   Anxiety:  - Continue prn xanax as above.   History of left foot fractures s/p ORIF and recent closed nondisplaced fracture of the base of the 5th metatarsal. - WBAT in CAM boot, PT/OT  DVT prophylaxis: Heparin [place] Code Status: Full Family Communication: Wife at bedside Disposition Plan:  Status is: Inpatient  Remains inpatient appropriate because:Ongoing diagnostic testing needed not appropriate for outpatient work up and IV treatments appropriate due to intensity of illness or inability to take PO  Dispo: The patient is from: Home              Anticipated d/c is to: TBD, CIR consulted              Anticipated d/c date is: 2 days if fever curve improves.              Patient currently is not medically stable to d/c.  Consultants:  ID  Cardiology for TEE  Radiology by phone  Procedures:   TEE planned 6/28  Antimicrobials:  Ceftriaxone 6/24  Ampicillin  6/25   Daptomycin 6/27  Subjective: Had fever last night with concomitant delirium. Has been eating ok. No urinary complaints. Has had oxygen applied, but reports no dyspnea/cough. Denies chest pain.   Objective: Vitals:   06/29/19 0325 06/29/19 0500 06/29/19 0537 06/29/19 0723  BP:   136/81   Pulse:   84   Resp:   (!) 30   Temp: 99.8 F (37.7 C)  98.9 F (37.2 C)   TempSrc: Oral  Oral   SpO2:   96%   Weight:  100 kg    Height:    5\' 11"  (1.803 m)    Intake/Output Summary (Last 24 hours) at 06/29/2019 0958 Last data filed at 06/29/2019 0900 Gross per 24 hour  Intake 2843.25 ml  Output 1800 ml  Net 1043.25 ml   Filed Weights   06/27/19 0500 06/28/19 0524 06/29/19 0500  Weight: 94.6 kg 100.1 kg 100 kg   Gen: 64 y.o. male in no distress Pulm: Nonlabored but tachypneic at rest, no crackles or wheezes. CV: Regular rate and rhythm. No murmur, rub, or gallop. No JVD, no dependent edema. GI: Abdomen soft, not significantly tender, non-distended, with normoactive bowel sounds.  Ext: Warm, no deformities. Hyperkeratotic toe nails Skin: No new rashes, lesions or ulcers on visualized skin. Neuro: Alert and oriented. No focal neurological deficits. Psych: Judgement and insight appear fair. Mood euthymic & affect congruent. Behavior is appropriate.    Data Reviewed: I have personally reviewed following labs and imaging studies  CBC: Recent Labs  Lab 06/25/19 1144 06/26/19 0620 06/27/19 1122 06/28/19 0517 06/29/19 0509  WBC 6.1 6.9 6.7 5.2 6.5  NEUTROABS 5.5  --   --  3.6  --   HGB 10.4* 9.5* 9.3* 8.7* 8.6*  HCT 30.0* 28.2* 27.7* 26.5* 26.5*  MCV 99.7 102.2* 103.7* 103.9* 105.2*  PLT 133* 122* 118* 118* 175*   Basic Metabolic Panel: Recent Labs  Lab 06/25/19 1335 06/26/19 0620 06/27/19 1122 06/28/19 0517 06/29/19 0509  NA 125* 126* 131* 133* 134*  K 5.8* 4.4 4.3 4.0 3.7  CL 87* 89* 93* 99 100  CO2 27 23 22 24 27   GLUCOSE 130* 104* 109* 95 107*  BUN 33* 29* 29*  23 19  CREATININE 1.99* 1.65* 1.57* 1.36* 1.30*  CALCIUM 9.1 8.6* 8.2* 8.0* 8.0*   GFR: Estimated Creatinine Clearance: 71 mL/min (A) (by C-G formula based on SCr of 1.3 mg/dL (H)). Liver Function Tests: Recent Labs  Lab 06/25/19 1335 06/26/19 0620 06/27/19 1122 06/28/19 0517 06/29/19 0509  AST 68* 268* 125* 76* 68*  ALT 57* 116* 97* 70* 62*  ALKPHOS 90 70 50 44 53  BILITOT 0.8 0.8 0.7 0.8 0.8  PROT 7.7 7.5 7.0 6.2* 6.1*  ALBUMIN 4.0 3.8 3.2* 2.8* 2.6*   No results for input(s): LIPASE, AMYLASE in the last 168 hours. Recent Labs  Lab 06/25/19 2122  AMMONIA 25   Coagulation Profile: No results for input(s): INR, PROTIME in the last 168 hours. Cardiac Enzymes: Recent Labs  Lab 06/25/19 1335  CKTOTAL 344   BNP (last 3 results) No results for input(s): PROBNP in the last 8760 hours. HbA1C: No results for input(s): HGBA1C in the last 72 hours. CBG: Recent Labs  Lab 06/25/19 1226  GLUCAP 140*   Lipid Profile: No results for input(s): CHOL, HDL, LDLCALC, TRIG, CHOLHDL, LDLDIRECT in the  last 72 hours. Thyroid Function Tests: No results for input(s): TSH, T4TOTAL, FREET4, T3FREE, THYROIDAB in the last 72 hours. Anemia Panel: No results for input(s): VITAMINB12, FOLATE, FERRITIN, TIBC, IRON, RETICCTPCT in the last 72 hours. Urine analysis:    Component Value Date/Time   COLORURINE YELLOW 06/25/2019 1144   APPEARANCEUR CLEAR 06/25/2019 1144   APPEARANCEUR Clear 02/27/2017 1555   LABSPEC 1.011 06/25/2019 1144   PHURINE 5.0 06/25/2019 1144   GLUCOSEU >=500 (A) 06/25/2019 1144   HGBUR SMALL (A) 06/25/2019 1144   BILIRUBINUR NEGATIVE 06/25/2019 1144   BILIRUBINUR Negative 02/27/2017 1555   KETONESUR 5 (A) 06/25/2019 1144   PROTEINUR NEGATIVE 06/25/2019 1144   NITRITE NEGATIVE 06/25/2019 1144   LEUKOCYTESUR NEGATIVE 06/25/2019 1144   Recent Results (from the past 240 hour(s))  Urine culture     Status: Abnormal   Collection Time: 06/25/19 12:02 PM   Specimen:  Urine, Clean Catch  Result Value Ref Range Status   Specimen Description   Final    URINE, CLEAN CATCH Performed at [facility], [place] [address] [place], [place] [address]    Special Requests   Final    NONE Performed at [facility], [place] [address], [place], [place] [address]    Culture >=100,000 COLONIES/mL ENTEROCOCCUS FAECALIS (A)  Final   Report Status 06/28/2019 FINAL  Final   Organism ID, Bacteria ENTEROCOCCUS FAECALIS (A)  Final      Susceptibility   Enterococcus faecalis - MIC*    AMPICILLIN <=2 SENSITIVE Sensitive     NITROFURANTOIN <=16 SENSITIVE Sensitive     VANCOMYCIN 1 SENSITIVE Sensitive     * >=100,000 COLONIES/mL ENTEROCOCCUS FAECALIS  Culture, blood (routine x 2)     Status: Abnormal   Collection Time: 06/25/19  1:08 PM   Specimen: BLOOD  Result Value Ref Range Status   Specimen Description   Final    BLOOD RIGHT ANTECUBITAL Performed at [facility], [place] [address] [place], [place] [address]    Special Requests   Final    BOTTLES DRAWN AEROBIC AND ANAEROBIC Blood Culture adequate volume Performed at [place] [address], [place], [place] [address]    Culture  Setup Time   Final    GRAM POSITIVE COCCI IN BOTH AEROBIC AND ANAEROBIC BOTTLES CRITICAL RESULT CALLED TO, READ BACK BY AND VERIFIED WITH: M. RENZ PHARMD, AT [id number] 06/26/19 BY Rush Landmark Performed at [facility], [place] [address], [place], [place] [address]    Culture ENTEROCOCCUS FAECALIS (A)  Final   Report Status 06/28/2019 FINAL  Final   Organism ID, Bacteria ENTEROCOCCUS FAECALIS  Final      Susceptibility   Enterococcus faecalis - MIC*    AMPICILLIN <=2 SENSITIVE Sensitive     VANCOMYCIN 1 SENSITIVE Sensitive     GENTAMICIN SYNERGY SENSITIVE Sensitive     * ENTEROCOCCUS FAECALIS  SARS Coronavirus 2 by RT PCR (hospital order, performed in [place] hospital lab) Nasopharyngeal Nasopharyngeal Swab     Status: None     Collection Time: 06/25/19  1:08 PM   Specimen: Nasopharyngeal Swab  Result Value Ref Range Status   SARS Coronavirus 2 NEGATIVE NEGATIVE Final    Comment: (NOTE) SARS-CoV-2 target nucleic acids are NOT DETECTED.  The SARS-CoV-2 RNA is generally detectable in upper and lower respiratory specimens during the acute phase of infection. The lowest concentration of SARS-CoV-2 viral copies this assay can detect is 250 copies / mL. A  negative result does not preclude SARS-CoV-2 infection and should not be used as the sole basis for treatment or other patient management decisions.  A negative result may occur with improper specimen collection / handling, submission of specimen other than nasopharyngeal swab, presence of viral mutation(s) within the areas targeted by this assay, and inadequate number of viral copies (<250 copies / mL). A negative result must be combined with clinical observations, patient history, and epidemiological information.  Fact Sheet for Patients:   StrictlyIdeas.no  Fact Sheet for Healthcare Providers: BankingDealers.co.za  This test is not yet approved or  cleared by the [place] FDA and has been authorized for detection and/or diagnosis of SARS-CoV-2 by FDA under an Emergency Use Authorization (EUA).  This EUA will remain in effect (meaning this test can be used) for the duration of the COVID-19 declaration under Section 564(b)(1) of the Act, 21 U.S.C. section 360bbb-3(b)(1), unless the authorization is terminated or revoked sooner.  Performed at [facility], [place] [address], [place], [place] [address]   Blood Culture ID Panel (Reflexed)     Status: Abnormal   Collection Time: 06/25/19  1:08 PM  Result Value Ref Range Status   Enterococcus species DETECTED (A) NOT DETECTED Final    Comment: CRITICAL RESULT CALLED TO, READ BACK BY AND VERIFIED WITH: M. RENZ PHARMD, AT 6045 06/26/19 BY D.  VANHOOK    Vancomycin resistance NOT DETECTED NOT DETECTED Final   Listeria monocytogenes NOT DETECTED NOT DETECTED Final   Staphylococcus species NOT DETECTED NOT DETECTED Final   Staphylococcus aureus (BCID) NOT DETECTED NOT DETECTED Final   Streptococcus species NOT DETECTED NOT DETECTED Final   Streptococcus agalactiae NOT DETECTED NOT DETECTED Final   Streptococcus pneumoniae NOT DETECTED NOT DETECTED Final   Streptococcus pyogenes NOT DETECTED NOT DETECTED Final   Acinetobacter baumannii NOT DETECTED NOT DETECTED Final   Enterobacteriaceae species NOT DETECTED NOT DETECTED Final   Enterobacter cloacae complex NOT DETECTED NOT DETECTED Final   Escherichia coli NOT DETECTED NOT DETECTED Final   Klebsiella oxytoca NOT DETECTED NOT DETECTED Final   Klebsiella pneumoniae NOT DETECTED NOT DETECTED Final   Proteus species NOT DETECTED NOT DETECTED Final   Serratia marcescens NOT DETECTED NOT DETECTED Final   Haemophilus influenzae NOT DETECTED NOT DETECTED Final   Neisseria meningitidis NOT DETECTED NOT DETECTED Final   Pseudomonas aeruginosa NOT DETECTED NOT DETECTED Final   Candida albicans NOT DETECTED NOT DETECTED Final   Candida glabrata NOT DETECTED NOT DETECTED Final   Candida krusei NOT DETECTED NOT DETECTED Final   Candida parapsilosis NOT DETECTED NOT DETECTED Final   Candida tropicalis NOT DETECTED NOT DETECTED Final    Comment: Performed at [facility], [place] [address] [place], [place] [address]  Culture, blood (routine x 2)     Status: None (Preliminary result)   Collection Time: 06/25/19  1:52 PM   Specimen: BLOOD LEFT HAND  Result Value Ref Range Status   Specimen Description   Final    BLOOD LEFT HAND Performed at [place] [address], [place], [place] [address]    Special Requests   Final    BOTTLES DRAWN AEROBIC AND ANAEROBIC Blood Culture adequate volume Performed at [place] [address],  [place], [place] [address]    Culture   Final    NO GROWTH 3 DAYS Performed at [facility], [place] [address], [place], [place] [address]    Report Status PENDING  Incomplete  Culture, blood (routine x 2)     Status: None (Preliminary result)   Collection Time: 06/27/19 11:22 AM   Specimen: BLOOD  Result Value Ref Range Status   Specimen Description   Final    BLOOD RIGHT HAND Performed at [place] [address], [place], [place] [address]    Special Requests   Final    BOTTLES DRAWN AEROBIC AND ANAEROBIC Blood Culture adequate volume Performed at [place] [address] [place], [place] [address]    Culture   Final    NO GROWTH 1 DAY Performed at [facility], [place] [address] [place], [place] [address]    Report Status PENDING  Incomplete  Culture, blood (routine x 2)     Status: None (Preliminary result)   Collection Time: 06/27/19 11:22 AM   Specimen: BLOOD  Result Value Ref Range Status   Specimen Description   Final    BLOOD LEFT HAND Performed at [place] [address] [place], [place] [address]    Special Requests   Final    BOTTLES DRAWN AEROBIC ONLY Blood Culture adequate volume Performed at [place] [address], [place], [place] [address]    Culture   Final    NO GROWTH 1 DAY Performed at [facility], [place] [address], [place], [place] [address]    Report Status PENDING  Incomplete      Radiology Studies: DG CHEST PORT 1 VIEW  Result Date: 06/27/2019 CLINICAL DATA:  Hypoxia, shortness of breath and weakness. EXAM: PORTABLE CHEST 1 VIEW COMPARISON:  06/25/2019 FINDINGS: Normal sized heart. Interval linear densities at both lung bases. Stable severe dextroconvex thoracic scoliosis. IMPRESSION: Interval bibasilar linear atelectasis. Electronically Signed   By: Claudie Revering M.D.   On: 06/27/2019 18:40    Scheduled Meds: . amLODipine  5 mg Oral Daily    . folic acid  1 mg Oral Daily  . gabapentin  300 mg Oral BID  . heparin  5,000 Units Subcutaneous Q8H  . metoprolol succinate  150 mg Oral Daily  . multivitamin with minerals  1 tablet Oral Daily  . pantoprazole (PROTONIX) IV  40 mg Intravenous Q12H  . thiamine  100 mg Oral Daily   Continuous Infusions: . sodium chloride 75 mL/hr at 06/28/19 2104  . sodium chloride Stopped (06/26/19 2125)  . DAPTOmycin (CUBICIN)  IV 600 mg (06/28/19 1525)  . thiamine injection       LOS: 4 days   Total time spent 35 minutes in seeing, examining patient, coordinating patient care, greater than 50% of which was spent on the floor and/or at the bedside.   Patrecia Pour, MD [facility] [email or web address] 06/29/2019, 9:58 AM

## 2019-06-29 NOTE — Anesthesia Preprocedure Evaluation (Addendum)
Anesthesia Evaluation  Patient identified by MRN, date of birth, ID band Patient awake    Reviewed: Allergy & Precautions, [name] Chart, lab work & pertinent test results  Airway Mallampati: II  TM Distance: >3 FB Neck ROM: Full    Dental no notable dental hx. (+) Teeth Intact, Dental Advisory Given   Pulmonary sleep apnea and Continuous Positive Airway Pressure Ventilation , COPD,  oxygen dependent,    Pulmonary exam normal breath sounds clear to auscultation + decreased breath sounds      Cardiovascular hypertension, Pt. on medications + Peripheral Vascular Disease and +CHF  Normal cardiovascular exam Rhythm:Regular Rate:Normal     Neuro/Psych Anxiety negative neurological ROS     GI/Hepatic   Endo/Other    Renal/GU ARFRenal disease     Musculoskeletal scoliosis   Abdominal   Peds  Hematology   Anesthesia Other Findings   1. Left ventricular ejection fraction, by estimation, is 55 to 60%. The  left ventricle has normal function. The left ventricle has no regional  wall motion abnormalities. Left ventricular diastolic parameters were  normal.  2. Right ventricular systolic function is mildly reduced at the RV apex.  The right ventricular size is normal. Tricuspid regurgitation signal is  inadequate for assessing PA pressure.  3. LA not well visualized. Left atrial size was mildly dilated by visual  estimate.  4. The mitral valve is normal in structure. Trivial mitral valve  regurgitation. No evidence of mitral stenosis.  5. The aortic valve is tricuspid. Aortic valve regurgitation is not  visualized. No aortic stenosis is present.  6. Aortic dilatation noted. There is mild dilatation of the aortic root  measuring 43 mm.  7. The inferior vena cava is normal in size with greater than 50%  respiratory variability, suggesting right atrial pressure of 3 mmHg.   Conclusion(s)/Recommendation(s): No  evidence of valvular vegetations on  this transthoracic echocardiogram. Would recommend a transesophageal  echocardiogram to exclude infective endocarditis if clinically indicated.  This exam was suboptimal for the  assessment of valvular lesions due to image quality.   FINDINGS  Left Ventricle: Left ventricular ejection fraction, by estimation, is 55  to 60%. The left ventricle has normal function. The left ventricle has no  regional wall motion abnormalities. Definity contrast agent was given IV  to delineate the left ventricular  endocardial borders. The left ventricular internal cavity size was normal  in size. There is no left ventricular hypertrophy. Left ventricular  diastolic parameters were normal.   Right Ventricle: The right ventricular size is normal. No increase in  right ventricular wall thickness. Right ventricular systolic function is  mildly reduced at the RV apex. Tricuspid regurgitation signal is  inadequate for assessing PA pressure.   Left Atrium: LA not well visualized. Left atrial size was mildly dilated.   Right Atrium: Right atrial size was not well visualized.   Pericardium: There is no evidence of pericardial effusion. Presence of  pericardial fat pad.   Mitral Valve: The mitral valve is normal in structure. Normal mobility of  the mitral valve leaflets. Trivial mitral valve regurgitation. No evidence  of mitral valve stenosis.   Tricuspid Valve: The tricuspid valve is normal in structure. Tricuspid  valve regurgitation is not demonstrated. No evidence of tricuspid  stenosis.   Aortic Valve: The aortic valve is tricuspid. Aortic valve regurgitation is  not visualized. No aortic stenosis is present. There is mild calcification  of the aortic valve.   Pulmonic Valve: The pulmonic valve was  grossly normal. Pulmonic valve  regurgitation is trivial. No evidence of pulmonic stenosis.   Aorta: Aortic dilatation noted. There is mild dilatation of the  aortic  root measuring 43 mm.   Venous: The inferior vena cava is normal in size with greater than 50%  respiratory variability, suggesting right atrial pressure of 3 mmHg.   IAS/Shunts: No atrial level shunt detected by color flow Doppler.     LEFT VENTRICLE  PLAX 2D  LVIDd:     5.50 cm   Diastology  LVIDs:     4.40 cm   LV e' lateral:  11.70 cm/s  LV PW:     0.90 cm   LV E/e' lateral: 4.5  LV IVS:    0.90 cm   LV e' medial:  7.94 cm/s  LVOT diam:   2.20 cm   LV E/e' medial: 6.6  LV SV:     72  LV SV Index:  33  LVOT Area:   3.80 cm    LV Volumes (MOD)  LV vol d, MOD A2C: 119.0 ml  LV vol d, MOD A4C: 156.0 ml  LV vol s, MOD A2C: 62.5 ml  LV vol s, MOD A4C: 60.4 ml  LV SV MOD A2C:   56.5 ml  LV SV MOD A4C:   156.0 ml  LV SV MOD BP:   82.2 ml   RIGHT VENTRICLE  RV S prime:   12.50 cm/s  TAPSE (M-mode): 1.6 cm   LEFT ATRIUM       Index    RIGHT ATRIUM      Index  LA diam:    4.20 cm 1.91 cm/m RA Area:   13.20 cm  LA Vol (A2C):  35.3 ml 16.08 ml/m RA Volume:  32.00 ml 14.57 ml/m  LA Vol (A4C):  56.5 ml 25.73 ml/m  LA Biplane Vol: 44.6 ml 20.31 ml/m  AORTIC VALVE  LVOT Vmax:  117.00 cm/s  LVOT Vmean: 79.700 cm/s  LVOT VTI:  0.189 m    AORTA  Ao Root diam: 4.30 cm   MITRAL VALVE  MV Area (PHT): 3.74 cm  SHUNTS  MV Decel Time: 203 msec  Systemic VTI: 0.19 m  MV E velocity: 52.60 cm/s Systemic Diam: 2.20 cm  MV A velocity: 53.00 cm/s  MV E/A ratio: 0.99   Cherlynn Kaiser MD  Electronically signed by Cherlynn Kaiser MD  Signature Date/Time: 06/27/2019/6:20:35 AM      Final  Imaging Info  ECHOCARDIOGRAM COMPLETE (Order #[id number]) on 06/26/19 Study History  ECHOCARDIOGRAM COMPLETE (Order #[id number]) on 06/26/19 [employer] Images  Show images for ECHOCARDIOGRAM COMPLETE Images on [employer]  Show images for [name] Performing Technologist/Nurse   Performing Technologist/Nurse: Michiel Cowboy, RDCS Reason for Exam Priority: Routine Not on file Patient Data  BP  121/73 mmHg    Surgical History  Surgical History   No past medical history on file.  Other Surgical History   Procedure Laterality Date Comment Source APPLICATION OF WOUND VAC Left 08/04/6960 Procedure: Application Of Wound Vac; Surgeon: Newt Minion, MD; Location: [facility]; Service: Orthopedics; Laterality: Left;  BIOPSY  04/30/2019 Procedure: BIOPSY; Surgeon: Juanita Craver, MD; Location: [facility]; Service: Endoscopy;;  COLONOSCOPY  12/2013 polyps, int hem, diverticulosis, rpt 5 yrs [name])  COLONOSCOPY WITH PROPOFOL N/A 04/30/2019 Procedure: COLONOSCOPY WITH PROPOFOL; Surgeon: Juanita Craver, MD; Location: [facility]; Service: Endoscopy; Laterality: N/A;  ELBOW SURGERY Right 2008 golfer's elbow  ESOPHAGOGASTRODUODENOSCOPY (EGD) WITH PROPOFOL N/A 04/30/2019 Procedure: ESOPHAGOGASTRODUODENOSCOPY (EGD) WITH  PROPOFOL; Surgeon: Juanita Craver, MD; Location: [facility]; Service: Endoscopy; Laterality: N/A;  INGUINAL HERNIA REPAIR Bilateral childhood & ~1995   LAPAROTOMY N/A 01/05/2019 Procedure: EXPLORATORY LAPAROTOMY graham patch repair; Surgeon: Benjamine Sprague, DO; Location: [facility]; Service: General; Laterality: N/A;  NASAL SEPTUM SURGERY  2013 deviated septum  ORIF ANKLE FRACTURE Left 05/09/2018 Procedure: OPEN REDUCTION INTERNAL FIXATION LEFT LISFRANC FRACTURE/DISLOCATION; Surgeon: Newt Minion, MD; Location: [place]; Service: Orthopedics; Laterality: Left;  POLYPECTOMY  04/30/2019 Procedure: POLYPECTOMY; Surgeon: Juanita Craver, MD; Location: [facility]; Service: Endoscopy;;  US ECHOCARDIOGRAPHY  02/2009 WNL, EF >55% Claiborne Billings)  US RENAL/AORTA Right 05/2009 1-59% diameter reduction renal artery, rec rpt 2 yrs   Implants   No active implants to display in this view. Encounter-Level Documents - 06/25/2019:  Document on 06/29/2019 7:45 AM by Quintella Reichert, MD: ED PB Billing Extract Scan on 06/29/2019 9:11 AM by Default, Provider, MD Scan on 06/28/2019 1:29 AM by Default, Provider, MD Scan on 06/26/2019 10:20 AM by Default, Provider, MD Scan on 06/25/2019 1:31 PM by Default, Provider, MD Electronic signature on 06/25/2019 11:13 AM - 1 of 3 e-signatures recorded   Order-Level Documents - 06/25/2019:  Scan on 06/27/2019 6:21 AM by Default, Provider, MD Scan on 06/26/2019 10:20 AM by Default, Provider, MD   Resulted by:  Signed Date/Time  Phone Pager Cherlynn Kaiser A 06/27/2019 6:20 AM [phone]  External Result Report  External Result Report ECHOCARDIOGRAM COMPLETE: Patient Communication  Add Comments Seen    Reproductive/Obstetrics                           Anesthesia Physical Anesthesia Plan  ASA: III  Anesthesia Plan: MAC   Post-op Pain Management:    Induction:   PONV Risk Score and Plan: 1  Airway Management Planned: Natural Airway and Mask  Additional Equipment: TEE  Intra-op Plan:   Post-operative Plan:   Informed Consent: I have reviewed the patients History and Physical, chart, labs and discussed the procedure including the risks, benefits and alternatives for the proposed anesthesia with the patient or authorized representative who has indicated his/her understanding and acceptance.     Dental advisory given  Plan Discussed with: CRNA  Anesthesia Plan Comments:        Anesthesia Quick Evaluation

## 2019-06-29 NOTE — Anesthesia Procedure Notes (Signed)
Procedure Name: MAC Date/Time: 06/29/2019 12:40 PM Performed by: Renato Shin, CRNA Pre-anesthesia Checklist: Patient identified, Emergency Drugs available, Suction available and Patient being monitored Patient Re-evaluated:Patient Re-evaluated prior to induction Oxygen Delivery Method: Simple face mask Preoxygenation: Pre-oxygenation with 100% oxygen Induction Type: IV induction Placement Confirmation: positive ETCO2 and breath sounds checked- equal and bilateral Dental Injury: Teeth and Oropharynx as per pre-operative assessment

## 2019-06-30 ENCOUNTER — Inpatient Hospital Stay (HOSPITAL_COMMUNITY): Payer: 59

## 2019-06-30 ENCOUNTER — Inpatient Hospital Stay: Payer: Self-pay

## 2019-06-30 LAB — COMPREHENSIVE METABOLIC PANEL
ALT: 51 U/L — ABNORMAL HIGH (ref 0–44)
AST: 40 U/L (ref 15–41)
Albumin: 2.4 g/dL — ABNORMAL LOW (ref 3.5–5.0)
Alkaline Phosphatase: 61 U/L (ref 38–126)
Anion gap: 8 (ref 5–15)
BUN: 17 mg/dL (ref 8–23)
CO2: 28 mmol/L (ref 22–32)
Calcium: 8 mg/dL — ABNORMAL LOW (ref 8.9–10.3)
Chloride: 100 mmol/L (ref 98–111)
Creatinine, Ser: 1.02 mg/dL (ref 0.61–1.24)
GFR calc Af Amer: >60 mL/min (ref >60)
GFR calc non Af Amer: >60 mL/min (ref >60)
Glucose, Bld: 113 mg/dL — ABNORMAL HIGH (ref 70–99)
Potassium: 3.9 mmol/L (ref 3.5–5.1)
Sodium: 136 mmol/L (ref 135–145)
Total Bilirubin: 0.6 mg/dL (ref 0.3–1.2)
Total Protein: 6.1 g/dL — ABNORMAL LOW (ref 6.5–8.1)

## 2019-06-30 LAB — CBC
HCT: 27.2 % — ABNORMAL LOW (ref 39.0–52.0)
Hemoglobin: 8.6 g/dL — ABNORMAL LOW (ref 13.0–17.0)
MCH: 34.3 pg — ABNORMAL HIGH (ref 26.0–34.0)
MCHC: 31.6 g/dL (ref 30.0–36.0)
MCV: 108.4 fL — ABNORMAL HIGH (ref 80.0–100.0)
Platelets: 173 10*3/uL (ref 150–400)
RBC: 2.51 MIL/uL — ABNORMAL LOW (ref 4.22–5.81)
RDW: 13.2 % (ref 11.5–15.5)
WBC: 6.7 10*3/uL (ref 4.0–10.5)
nRBC: 0 % (ref 0.0–0.2)

## 2019-06-30 LAB — CULTURE, BLOOD (ROUTINE X 2)
Culture: NO GROWTH
Special Requests: ADEQUATE

## 2019-06-30 LAB — PREALBUMIN: Prealbumin: 5.5 mg/dL — ABNORMAL LOW (ref 18–38)

## 2019-06-30 MED ORDER — CHLORHEXIDINE GLUCONATE CLOTH 2 % EX PADS
6.0000 | MEDICATED_PAD | Freq: Every day | CUTANEOUS | Status: DC
  Administered 2019-06-30 – 2019-07-07 (×8): 6 via TOPICAL

## 2019-06-30 MED ORDER — SODIUM CHLORIDE 0.9% FLUSH: 10.0000 mL | INTRAVENOUS | Status: DC | PRN

## 2019-06-30 MED ORDER — PRO-STAT SUGAR FREE PO LIQD
30.0000 mL | Freq: Two times a day (BID) | ORAL | Status: DC
  Administered 2019-06-30 – 2019-07-01 (×3): 30 mL via ORAL
  Filled 2019-06-30 (×3): qty 30

## 2019-06-30 MED ORDER — FUROSEMIDE 10 MG/ML IJ SOLN
20.0000 mg | Freq: Once | INTRAMUSCULAR | Status: AC
  Administered 2019-06-30: 20 mg via INTRAVENOUS
  Filled 2019-06-30: qty 2

## 2019-06-30 NOTE — Progress Notes (Signed)
Inpatient Rehab Admissions:  Inpatient Rehab Consult received.  I spoke with pt and his wife over the phone for rehabilitation assessment and to discuss goals and expectations of an inpatient rehab admission.  They are both very interested in CIR program.  Wife is retired and able to provide 24/7 assist at discharge if needed.  Will plan to open insurance for authorization today and f/u with pt/family pending their determination.    Signed: Shann Medal, PT, DPT Admissions Coordinator [phone] 06/30/19  4:10 PM

## 2019-06-30 NOTE — Plan of Care (Signed)
  Problem: Education: Goal: Knowledge of General Education information will improve Description: Including pain rating scale, medication(s)/side effects and non-pharmacologic comfort measures Outcome: Completed/Met   Problem: Health Behavior/Discharge Planning: Goal: Ability to manage health-related needs will improve Outcome: Progressing   Problem: Clinical Measurements: Goal: Ability to maintain clinical measurements within normal limits will improve Outcome: Progressing Goal: Will remain free from infection Outcome: Progressing Goal: Diagnostic test results will improve Outcome: Progressing Goal: Respiratory complications will improve Outcome: Progressing Goal: Cardiovascular complication will be avoided Outcome: Progressing   Problem: Activity: Goal: Risk for activity intolerance will decrease Outcome: Progressing   Problem: Nutrition: Goal: Adequate nutrition will be maintained Outcome: Progressing   Problem: Coping: Goal: Level of anxiety will decrease Outcome: Progressing   Problem: Coping: Goal: Level of anxiety will decrease Outcome: Progressing   Problem: Elimination: Goal: Will not experience complications related to bowel motility Outcome: Progressing Goal: Will not experience complications related to urinary retention Outcome: Completed/Met   Problem: Pain Managment: Goal: General experience of comfort will improve Outcome: Completed/Met   Problem: Safety: Goal: Ability to remain free from injury will improve Outcome: Progressing   Problem: Skin Integrity: Goal: Risk for impaired skin integrity will decrease Outcome: Progressing

## 2019-06-30 NOTE — Progress Notes (Signed)
Physical Therapy Treatment Patient Details Name: Louis [name] MRN: [id number] DOB: 16-Jan-1955 Today's Date: 06/30/2019    History of Present Illness Louis [name] is a 64 y.o. male with a history of scoliosis with restrictive and obstructive lung disease, chronic HFpEF, OSA and nocturnal hypoxia on CPAP and 2L O2, essential tremor, hepatic steatosis, and perforated duodenal ulcer s/p [name] Jan 2021 who presented to the ED with increasing falls and progressive weakness. He was found to be febrile to 101.62F and tachypneic though not hypoxemic. Work up included CXR with indistinct hazy left-sided opacity and subsequent CTA chest after d-dimer was 1.90 which had motion degradation without PE or focal consolidation. There were incidental findings including renal lesions for which abdominal MR was recommended.    PT Comments    Patient making steady progress with acute therapies. Patient noted to be desaturating with SpO2 in 60's on RA at start of session. Pt placed on 4L/min and SpO2 improved to 89% with mobility. MD present during session and aware of increased O2 needs. Pt improved ability to mobilize with supine to sit transfer today however continues to have trouble with coordination and requires +2 assist for safety. Pt with improved srength or power up from EOB however impulsive with transfer and min assist +2 requried to steady to prevent LOB due to significant posterior lean with rising. Pt required Max +2 assist to maintain balance with side steps to move to recliner and required assist for walker positioning and to shift weight more midline. Patient left in room with OT for self care tasks. Acute PT will continue to progress pt as able. Pt remains excellent candidate for intense CIR level therapy follow up. Pt has 24/7 assist available from spouse/family at discharge.    Follow Up Recommendations  CIR     Equipment Recommendations  Rolling walker with 5" wheels    Recommendations for  Other Services       Precautions / Restrictions Precautions Precautions: Fall Precaution Comments: watch O2 sats Restrictions Weight Bearing Restrictions: No Other Position/Activity Restrictions: per chart: WBAT in shoes with carbon plate inserts per wife    Mobility  Bed Mobility Overal bed mobility: Needs Assistance Bed Mobility: Supine to Sit     Supine to sit: Mod assist;+2 for safety/equipment;HOB elevated     General bed mobility comments: verbal/tactile cues required for sequencing. pt required extra time for processing. Pt distracted by persons in room (spouse, MD, 2 OT's and 1 PT). Once pt understood to sit up he moved to EOB with closer to Gordon assist.  Transfers Overall transfer level: Needs assistance Equipment used: Rolling walker (2 wheeled) Transfers: Sit to/from [facility] Sit to Stand: Min assist;+2 physical assistance;From elevated surface Stand pivot transfers: Max assist;+2 physical assistance;+2 safety/equipment       General transfer comment: Bed elevated to improve pt's independence with power up. Cues for safe hand placement/technique required. Pt stood impulsively at EOB with significant posterior lean with rising. VC's to shift weight into great toes improved posture. Max Assist +2 to steady and assist with RW managment for stand step transfer to recliner. pt with Rt lean during Lt lateral stepping.  Ambulation/Gait      Stairs          Wheelchair Mobility    Modified Rankin (Stroke Patients Only)       Balance Overall balance assessment: Needs assistance   Sitting balance-Leahy Scale: Fair   Postural control: Posterior lean;Right lateral lean   Standing  balance-Leahy Scale: Zero Standing balance comment: R lateral lean           Cognition Arousal/Alertness: Awake/alert Behavior During Therapy: Restless;Impulsive Overall Cognitive Status: Impaired/Different from baseline Area of Impairment:  Attention;Memory;Safety/judgement;Awareness;Problem solving        Current Attention Level: Selective Memory: Decreased short-term memory Following Commands: Follows one step commands consistently Safety/Judgement: Decreased awareness of safety;Decreased awareness of deficits Awareness: Emergent Problem Solving: Slow processing General Comments: Improved cognition since initial eval however not at baseline.      Exercises      General Comments        Pertinent Vitals/Pain Pain Assessment: 0-10 Pain Score: 0-No pain Pain Intervention(s): Limited activity within patient's tolerance;Monitored during session    Home Living   Prior Function    PT Goals (current goals can now be found in the care plan section) Acute Rehab PT Goals Patient Stated Goal: I want to be able to walk again PT Goal Formulation: With patient Time For Goal Achievement: 07/10/19 Potential to Achieve Goals: Good Progress towards PT goals: Progressing toward goals    Frequency    Min 3X/week      PT Plan Current plan remains appropriate    Co-evaluation PT/OT/SLP Co-Evaluation/Treatment: Yes Reason for Co-Treatment: Complexity of the patient's impairments (multi-system involvement);For patient/therapist safety;To address functional/ADL transfers PT goals addressed during session: Mobility/safety with mobility;Balance;Proper use of DME        AM-PAC PT "6 Clicks" Mobility   Outcome Measure  Help needed turning from your back to your side while in a flat bed without using bedrails?: A Lot Help needed moving from lying on your back to sitting on the side of a flat bed without using bedrails?: A Lot Help needed moving to and from a bed to a chair (including a wheelchair)?: A Lot Help needed standing up from a chair using your arms (e.g., wheelchair or bedside chair)?: Total Help needed to walk in hospital room?: Total Help needed climbing 3-5 steps with a railing? : Total 6 Click Score: 9     End of Session Equipment Utilized During Treatment: Gait belt Activity Tolerance: Patient tolerated treatment well Patient left: with call bell/phone within reach;in chair;with chair alarm set;with family/visitor present;Other (comment) (OT in room) Nurse Communication: Mobility status PT Visit Diagnosis: Other abnormalities of gait and mobility (R26.89)     Time: 7290-2111 PT Time Calculation (min) (ACUTE ONLY): 29 min  Charges:  $Therapeutic Activity: 8-22 mins                     Verner Mould, DPT Acute Rehabilitation Services  Office [phone] Pager [phone]  06/30/2019 5:35 PM

## 2019-06-30 NOTE — Progress Notes (Signed)
Initial Nutrition Assessment  DOCUMENTATION CODES:   Not applicable  INTERVENTION:  10ml Pro-stat po BID, each supplement provides 100 kcal and 15 grams of protein  Continue Ensure Enlive po BID, each supplement provides 350 kcal and 20 grams of protein  Continue MVI daily   NUTRITION DIAGNOSIS:   Inadequate oral intake related to poor appetite as evidenced by per patient/family report.    GOAL:   Patient will meet greater than or equal to 90% of their needs    MONITOR:   PO intake, Supplement acceptance, Weight trends, Labs, I & O's  REASON FOR ASSESSMENT:   Consult Assessment of nutrition requirement/status  ASSESSMENT:   Pt presented with increasing falls and progressive weakness. Pt with Enterococcal bacteremia and ARF. PMH includes scoliosis with restrictive and obstructive lung disease, chronic HFpEF, OSA and nocturnal hypoxia on CPAP, essential tremor, hepatic steatosis, and perforated duodenal ulcer s/p [name] (Jan 2021).  Pt states he has had a poor appetite for ~2 months. For breakfast, he has a Boost shake and sometimes granola. For lunch, he eats ham and cheese. Dinner is the most varied, but is often a balanced meal that pt has been eating small portions of. Pt agreeable to Ensure while admitted.   Pt with poor PO intake since admit, consuming 0-50% of most meals. Pt noted to have eaten 100% of 1 meal, but meal consisted of yogurt and fruit only.  No significant wt changes noted.   UOP: 2,251ml x24 hours I/O: +1,992.44ml since admit  Labs reviewed. Medications: Ensure Enlive po BID, Folvite, MVI, Prilosec, Thiamine   NUTRITION - FOCUSED PHYSICAL EXAM:    Most Recent Value  Orbital Region No depletion  Upper Arm Region No depletion  Thoracic and Lumbar Region No depletion  Buccal Region No depletion  Temple Region No depletion  Clavicle Bone Region No depletion  Clavicle and Acromion Bone Region No depletion  Scapular Bone Region No  depletion  Dorsal Hand No depletion  Patellar Region No depletion  Anterior Thigh Region Mild depletion  Posterior Calf Region Moderate depletion  Edema (RD Assessment) None  Hair Reviewed  Eyes Reviewed  Mouth Reviewed  Skin Reviewed  Nails Reviewed       Diet Order:   Diet Order            Diet regular Room service appropriate? Yes; Fluid consistency: Thin  Diet effective now                 EDUCATION NEEDS:   No education needs have been identified at this time  Skin:  Skin Assessment: Reviewed RN Assessment  Last BM:  6/28  Height:   Ht Readings from Last 1 Encounters:  06/29/19 5\' 11"  (1.803 m)    Weight:   Wt Readings from Last 10 Encounters:  06/30/19 95.8 kg  06/16/19 99.8 kg  04/30/19 99.8 kg  01/26/19 99.8 kg  01/05/19 93.8 kg  08/05/18 99.8 kg  07/21/18 99.8 kg  06/30/18 99.8 kg  06/12/18 99.8 kg  05/29/18 99.8 kg    BMI:  Body mass index is 29.46 kg/m.  Estimated Nutritional Needs:   Kcal:  2000-2200  Protein:  100-115 grams  Fluid:  >2L/d    Louis Ina, MS, RD, LDN RD pager number and weekend/on-call pager number located in [place].

## 2019-06-30 NOTE — Progress Notes (Signed)
Peripherally Inserted Central Catheter Placement  The IV Nurse has discussed with the patient and/or persons authorized to consent for the patient, the purpose of this procedure and the potential benefits and risks involved with this procedure.  The benefits include less needle sticks, lab draws from the catheter, and the patient may be discharged home with the catheter. Risks include, but not limited to, infection, bleeding, blood clot (thrombus formation), and puncture of an artery; nerve damage and irregular heartbeat and possibility to perform a PICC exchange if needed/ordered by physician.  Alternatives to this procedure were also discussed.  Bard Power PICC patient education guide, fact sheet on infection prevention and patient information card has been provided to patient /or left at bedside.    PICC Placement Documentation  PICC Single Lumen 25/00/37 PICC Right Basilic 41 cm 0 cm (Active)  Indication for Insertion or Continuance of Line Prolonged intravenous therapies 06/30/19 1836  Exposed Catheter (cm) 0 cm 06/30/19 1836  Site Assessment Clean;Dry;Intact 06/30/19 1836  Line Status Flushed;Blood return noted 06/30/19 1836  Dressing Type Transparent 06/30/19 1836  Dressing Status Clean;Dry;Intact;Antimicrobial disc in place;Other (Comment) 06/30/19 1836  Dressing Intervention New dressing 06/30/19 1836  Dressing Change Due 07/07/19 06/30/19 1836       Christella Noa Albarece 06/30/2019, 6:38 PM

## 2019-06-30 NOTE — Progress Notes (Signed)
Occupational Therapy Treatment Patient Details Name: Louis [name] MRN: [id number] DOB: [birth year] Today's Date: 06/30/2019    History of present illness Louis [name] is a 64 y.o. male with a history of scoliosis with restrictive and obstructive lung disease, chronic HFpEF, OSA and nocturnal hypoxia on CPAP and 2L O2, essential tremor, hepatic steatosis, and perforated duodenal ulcer s/p [name] Jan 2021 who presented to the ED with increasing falls and progressive weakness. He was found to be febrile to 101.18F and tachypneic though not hypoxemic. Work up included CXR with indistinct hazy left-sided opacity and subsequent CTA chest after d-dimer was 1.90 which had motion degradation without PE or focal consolidation. There were incidental findings including renal lesions for which abdominal MR was recommended.   OT comments  Wife present  for session and clarified home information and caregiver support. Home has steps to entry however is accessible @ RW level. Wife states 24/7 support is available @ DC. On entry SpO2 69 on RA (finger probe and ear probe reading same with good pleth). O2 placed on 4L with SpO2 89 during mobility. Pt left on 2L with SpO2 93. Nsg made aware/MD in room at beginning of session and aware of SpO2. Pt making significant progress and able to mobilize to EOB with Mod A +2 for safety. Stood impulsively EOB with Min A +2  However demonstrated poor midline orientation and required Max A +2 for stand pivot transfer @ RW level due to significant R lateral lean. Completed grooming task seated with set up. Pt is an excellent candidate for CIR to maximize functional level of independence and facilitate safe DC home with supportive wife. Will continue to benefit from OT acute due to deficits listed below.    Follow Up Recommendations  CIR;Supervision/Assistance - 24 hour    Equipment Recommendations  Other (comment) (TBA; pt may DME at [facility])    Recommendations for Other Services  Rehab consult    Precautions / Restrictions Precautions Precautions: Fall Precaution Comments: watch O2 sats       Mobility Bed Mobility Overal bed mobility: Needs Assistance Bed Mobility: Supine to Sit     Supine to sit: Mod assist;+2 for safety/equipment     General bed mobility comments: Slow porcessing however increased distractions in room and feel pt was unsure that he needed to sit EOB; once clarified pt stated "oh I didn't know what you wanted me to do "; apparent slow processing and decreased ability to problem solve  Transfers Overall transfer level: Needs assistance   Transfers: Sit to/from Stand;Stand Pivot Transfers Sit to Stand: Min assist;+2 physical assistance;From elevated surface; VC for safe hand placement Stand pivot transfers: Max assist;+2 physical assistance       General transfer comment: Pt impulsively stood at bedside with posterior lean noted on bed; during stand pivot transfer to L, pt with significant R lateral lean    Balance Overall balance assessment: Needs assistance   Sitting balance-Leahy Scale: Fair       Standing balance-Leahy Scale: Zero Standing balance comment: R lateral lean    Pt able to shift weight anteriorly on toes however decreased insight into amount of R lateral lean                       ADL either performed or assessed with clinical judgement   ADL Overall ADL's : Needs assistance/impaired Eating/Feeding: Set up;Sitting   Grooming: Set up;Sitting       Lower Body Bathing:  Moderate assistance;Sit to/from stand       Lower Body Dressing: Maximal assistance;Sit to/from stand   Toilet Transfer: Maximal assistance;+2 for physical assistance Toilet Transfer Details (indicate cue type and reason): simulated Toileting- Clothing Manipulation and Hygiene: Maximal assistance Toileting - Clothing Manipulation Details (indicate cue type and reason): male external catheter     Functional mobility during  ADLs: Maximal assistance;+2 for physical assistance;Cueing for safety;Cueing for sequencing;Rolling walker       Vision   Additional Comments: will further assess; wears glaases   Perception     Praxis      Cognition Arousal/Alertness: Awake/alert Behavior During Therapy: Restless;Impulsive Overall Cognitive Status: Impaired/Different from baseline Area of Impairment: Attention;Memory;Safety/judgement;Awareness;Problem solving                   Current Attention Level: Selective Memory: Decreased short-term memory Following Commands: Follows one step commands consistently Safety/Judgement: Decreased awareness of safety;Decreased awareness of deficits Awareness: Emergent Problem Solving: Slow processing General Comments: Improved cognition since initial eval however not at baseline        Exercises Exercises: Other exercises Other Exercises Other Exercises: incentive spirometer x 5 - able to pull 252ml Other Exercises: encouraged BUE AROM through full ROM - completed x 5 BUE   Shoulder Instructions       General Comments Wife present for session asking apropriate questions    Pertinent Vitals/ Pain       Pain Assessment: Faces Faces Pain Scale: Hurts a little bit Pain Location: B feet Pain Descriptors / Indicators: Discomfort Pain Intervention(s): Limited activity within patient's tolerance  Home Living Family/patient expects to be discharged to:: Private residence Living Arrangements: Spouse/significant other Available Help at Discharge: Family;Available 24 hours/day Type of Home: House Home Access: Stairs to enter [employer] of Steps: 4 Entrance Stairs-Rails: Right       Bathroom Shower/Tub: Occupational psychologist: Handicapped height Bathroom Accessibility: Yes How Accessible: Accessible via walker Home Equipment: [place] - 2 wheels;Cane - single point;Crutches;Bedside commode;Hand held shower head          Prior  Functioning/Environment Level of Independence: Independent        Comments: original fx May 2020 (L foot); Recent fall June 13th ( R metatarsal fx) - per Sharol Given can wear steel insert shoes for mobility   Frequency  Min 2X/week        Progress Toward Goals  OT Goals(current goals can now be found in the care plan section)  Progress towards OT goals: Progressing toward goals  Acute Rehab OT Goals Patient Stated Goal: I want to be able to walk again OT Goal Formulation: With patient Time For Goal Achievement: 07/09/19 Potential to Achieve Goals: Good ADL Goals Pt Will Perform Grooming: with set-up;with supervision;sitting Pt Will Perform Upper Body Bathing: with supervision;with set-up;sitting Pt Will Perform Lower Body Bathing: with min assist;sit to/from stand Pt Will Transfer to Toilet: with min assist;bedside commode;stand pivot transfer Pt Will Perform Toileting - Clothing Manipulation and hygiene: with min assist;sit to/from stand  Plan Discharge plan needs to be updated;Frequency needs to be updated    Co-evaluation    PT/OT/SLP Co-Evaluation/Treatment: Yes Reason for Co-Treatment: Complexity of the patient's impairments (multi-system involvement);For patient/therapist safety;To address functional/ADL transfers   OT goals addressed during session: ADL's and self-care;Strengthening/ROM      AM-PAC OT "6 Clicks" Daily Activity     Outcome Measure   Help from another person eating meals?: A Little Help from another person taking care of  personal grooming?: A Little Help from another person toileting, which includes using toliet, bedpan, or urinal?: Total Help from another person bathing (including washing, rinsing, drying)?: A Lot Help from another person to put on and taking off regular upper body clothing?: A Little Help from another person to put on and taking off regular lower body clothing?: A Lot 6 Click Score: 14    End of Session Equipment Utilized During  Treatment: Gait belt;Rolling walker;Oxygen (4L during mobility)  OT Visit Diagnosis: Repeated falls (R29.6);Other abnormalities of gait and mobility (R26.89);Muscle weakness (generalized) (M62.81);Other symptoms and signs involving cognitive function;Pain Pain - Right/Left: Right Pain - part of body:  (foot)   Activity Tolerance     Patient Left in chair;with call bell/phone within reach;with chair alarm set;with family/visitor present   Nurse Communication Mobility status;Need for lift equipment (recommend use of St[employer])        Time: 2440-1027 OT Time Calculation (min): 48 min  Charges: OT General Charges $OT Visit: 1 Visit OT Treatments $Self Care/Home Management : 23-37 mins  Maurie Boettcher, OT/L   Acute OT Clinical Specialist [place] Pager [phone] Office [phone]    [facility] 06/30/2019, 2:11 PM

## 2019-06-30 NOTE — Progress Notes (Signed)
RT in to see pt. for assistance in placement of pts. home CPAP, pt. wants to remain on n/c, notified RN.

## 2019-06-30 NOTE — Progress Notes (Addendum)
Pt continues to be confused at times and has trouble understanding plan of care (importance of cpap and 02). Pt has to be reminded to keep 02 [place] on at times as well. Pt respiratory rate remains elevated at 24-34. Though his work of breathing does not reflect this rate as he appears to be comfortable overall and he is able to talk to staff well without complication. Pt 02 sat remains variable from 87-93 percent. Rn will continue to monitor pt overall status. Rn titrating 02 as needed.

## 2019-06-30 NOTE — Progress Notes (Signed)
PROGRESS NOTE  Louis [name]  INO:[id number] DOB: 1955-09-26 DOA: 06/25/2019 PCP: Rutherford Guys, MD  Outpatient Specialists: GI, Dr. [name]; Cardiology, Dr. Georgina Peer; Pulmonology, [name]  Brief Narrative: Louis [name] is a 64 y.o. male with a history of scoliosis with restrictive and obstructive lung disease, chronic HFpEF, OSA and nocturnal hypoxia on CPAP and 2L O2, essential tremor, hepatic steatosis, and perforated duodenal ulcer s/p [name] Jan 2021 who presented to the ED with increasing falls and progressive weakness. He was found to be febrile to 101.52F and tachypneic though not hypoxemic. Work up included CXR with indistinct hazy left-sided opacity and subsequent CTA chest after d-dimer was 1.90 which had motion degradation without PE or focal consolidation. There were incidental findings including renal lesions for which abdominal MR was recommended. Multiple metabolic derangements including hyponatremia (125), hyperkalemia (5.8) acute renal failure (SCr 1.99 from baseline of 1), and mild LFT elevations. BNP 229.1 and troponin negative. Hgb 10.4g/dl, and platelets 133. Urine micro showed many bacteria and hyaline casts without RBCs or WBCs. Ceftriaxone and 500cc NS given, blood and urine cultures sent, neurology consulted, and hospitalists consulted for admission.  Blood cultures grew out Enterococcus for which ampicillin was started, ID consulted, and TEE planned. Urine culture has also grown E. faecalis. MRI abdomen was performed showing no intraabdominal abscess, and benign-appearing renal cysts.  Hospitalization has been complicated by diffuse severe weakness, recurrent fevers in the absence of sepsis, and waxing/waning delirium.  Assessment & Plan: Active Problems:   HTN (hypertension)   Anxiety state   Essential tremor   Renal artery stenosis in 1 of 2 vessels (HCC)   Transaminitis   SOB (shortness of breath)   Chronic respiratory failure with hypoxia and hypercapnia (HCC)    OSA (obstructive sleep apnea)   Hyperkalemia   Hyponatremia   ARF (acute renal failure) (HCC)   Bacteremia due to Enterococcus  Acute renal failure: Prerenal by FENa (0.41%) with hyaline casts. Subnephrotic proteinuria nonspecific, nonoliguric. - Has resolved. - Check BMP w/diuresis as below. Avoid nephrotoxins.  Enterococcus bacteremia: Urinary source w/+UCx vs. descending infection with unclear source. No abscess on cross-sectional abdominal imaging. No pyelonephritis suggested. No prostate tenderness on exam to suggest prostatitis/abscess. Abnormalities on cervical CT are not at all typical of infection per radiologist I spoke with, Dr. Loletta Specter. - Ampicillin started, switched to daptomycin for concern of drug-related fever. Last dose ampicillin 6/27 AM was also last fever. Will insert PICC w/repeat blood cultures negative, and afebrile x24 hrs.  - Echo and subsequent TEE confirms no vegetations.   - Repeat Cx's 6/26 remains negative thus far (3 days)  Acute delirium: With negative neuroimaging, waxing/waning course associated w/fever. - Delirium precautions - Treating infection.  Acute hypoxemic respiratory failure: Developing hypoxemia and crackles on exam in setting of immobility and prior CXR suggestive of atelectasis. Also has been receiving IV fluids though LV function appears normal on echo and no peripheral edema on exam.  - Will give lasix x1 - Continue to maximize OOB, incentive spirometry.  - Will get CXR.  - Note daptomycin does not penetrate lungs well. - Wean oxygen as tolerated. Pt is not in respiratory distress. - Not a frequently reported effect of daptomycin (rare eosinophilic pneumonitis, BOOP)  Hyperkalemia: Recurrent problem, resolved by holding spironolactone and ARB.   - Continue to hold spironolactone and ARB. Would be in favor of switching back to lasix from spironolactone, though will defer to cardiology as outpatient.  - Continue cardiac monitoring.  Hyponatremia: Recurrent problem/chronic, suspect hypovolemic, isoosmolar, possibly exacerbated by EtOH.  - Has resolved with IVF.   Chronic HFpEF (LVEF wnl, G2DD), longstanding resistant HTN: BNP elevated but lower than previous value and does not appear overloaded by exam. No effusions or airspace opacities on CTA chest.  - Echocardiogram showed improvement with LVEF 63-78%, normal diastolic parameters, normal RV size, reported mild reduction in RV apical function of uncertain significance. Findings confirmed on TEE. IVC appeared normal. - Holding spironolactone and ARB. Will give lasix x1 empirically with increasing crackles on exam as above. - Continue metoprolol and norvasc.  - Monitor daily weights, I/O  Aortic root dilation: 29mm.  - Surveillance recommended.   Frequent falls, progressive generalized weakness: LE neuropathy contributing to fall risk.   - Neurology recommendations appreciated, CT and MRI brain unrevealing. Will continue B12 supplementation, advise alcohol cessation, and recommend close outpatient follow up for EMG/NCS.  - PT/OT consulted. CIR recommended which is being pursued. Confirmed current plan of care is compatible with that disposition. Will await bed availability.   Essential tremor: Worsening with ataxic features and increasing falls. ?if worsened with gabapentin in setting of renal failure as this has improved with improvement in CrCl. - Vitamin B12 (low normal), ceruloplasmin (will reorder with AM labs as specimen hemolyzed per [facility]), thiamine (still pending), A1c (5.7%). Supplementing thiamine empirically and B12.  LFT elevation and hepatic steatosis: Hepatitis panel negative. Likely elevations are due to sepsis, improving.  - Alcohol moderation counseling provided.  Mild protein-calorie malnutrition: Suspected diagnosis based on hypoalbuminemia, prealbumin 5.5 and reported delayed surgical wound healing. - Supplement protein per dietitian consult.   Renal lesions: Seen incidentally on CTA chest. Benign appearance on MRI  OSA, chronic nocturnal hypoxemic respiratory failure, restrictive lung disease:  - Continue CPAP with 2L O2 qHS.   History of perforated duodenal ulcer s/p [name] Jan 2021: Subsequent EGD confirmed resolution of duodenal ulceration. H. pylori has been negative.  - Restart PPI, omeprazole  - Avoid NSAIDs  Chronic macrocytic, hyperchromic anemia: Unclear etiology, possible AOCD with high ferritin (though likely acting as acute phase reactant) and low TIBC, possibly related to hepatic dysfunction. - Supp B12 and monitor  Thrombocytopenia: Suspect hepatic steatosis, though if improves over next few days, could have been due to sepsis. - Continue monitoring.    Alcohol overuse: No history of withdrawal and is drinking less alcohol than previously.  - Alcohol moderation counseling performed.  - No current evidence of withdrawal.  Rounded 8 mm area of lucency within the C3 vertebral body and 9 mm sclerotic lesion within the C6 vertebral body: Incidental findings on cervical spine CT at admission. This is nonspecific. Discussed with radiology again 6/27, Dr. Loletta Specter, who states this is typical of benign etiologies and not infection. - Consider nonemergent nuclear medicine bone scan.  Aortic atherosclerosis: Noted.   Anxiety:  - Continue prn xanax as above.   History of left foot fractures s/p ORIF and recent closed nondisplaced fracture of the base of the 5th metatarsal. - WBAT in CAM boot, PT/OT  DVT prophylaxis: Heparin [place] Code Status: Full Family Communication: Wife at bedside Disposition Plan:  Status is: Inpatient  Remains inpatient appropriate because:Ongoing diagnostic testing needed not appropriate for outpatient work up and IV treatments appropriate due to intensity of illness or inability to take PO  Dispo: The patient is from: Home              Anticipated d/c is to: CIR  Anticipated d/c date is: 6/30              Patient currently is not medically stable to d/c.  Consultants:   ID  Cardiology for TEE  Radiology by phone  Procedures:   TEE 6/28 negative for vegetations. Normal LV function.  Antimicrobials:  Ceftriaxone 6/24  Ampicillin 6/25   Daptomycin 6/27  Subjective: Fevers have resolved over past 24 hours, pt has had normalizing mentation since that last fever. Denies any shortness of breath or chest pain, though seems to require 4L O2.   Objective: Vitals:   06/30/19 0434 06/30/19 0445 06/30/19 0815 06/30/19 1353  BP: (!) 151/81  129/76 122/75  Pulse: 81  97 88  Resp:   (!) 34   Temp:   98.2 F (36.8 C) 98.9 F (37.2 C)  TempSrc:   Oral Oral  SpO2:   91% 91%  Weight:  95.8 kg    Height:        Intake/Output Summary (Last 24 hours) at 06/30/2019 1539 Last data filed at 06/30/2019 1400 Gross per 24 hour  Intake 2225.84 ml  Output 2200 ml  Net 25.84 ml   Filed Weights   06/28/19 0524 06/29/19 0500 06/30/19 0445  Weight: 100.1 kg 100 kg 95.8 kg   Gen: 64 y.o. male alert and oriented in no distress Pulm: Nonlabored with belly breathing, tachypneic, crackles at bases. CV: Regular rate and rhythm. No murmur, rub, or gallop. No JVD, no dependent edema. GI: Abdomen soft, non-tender, non-distended, with normoactive bowel sounds.  Ext: Warm, no deformities. No cyanosis. Skin: No rashes, lesions or ulcers on visualized skin. Neuro: Alert and oriented. No focal neurological deficits. Psych: Judgement and insight appear fair. Mood euthymic & affect congruent. Behavior is appropriate.    Data Reviewed: I have personally reviewed following labs and imaging studies  CBC: Recent Labs  Lab 06/25/19 1144 06/25/19 1144 06/26/19 0620 06/27/19 1122 06/28/19 0517 06/29/19 0509 06/30/19 0440  WBC 6.1   < > 6.9 6.7 5.2 6.5 6.7  NEUTROABS 5.5  --   --   --  3.6  --   --   HGB 10.4*   < > 9.5* 9.3* 8.7* 8.6* 8.6*  HCT 30.0*   < >  28.2* 27.7* 26.5* 26.5* 27.2*  MCV 99.7   < > 102.2* 103.7* 103.9* 105.2* 108.4*  PLT 133*   < > 122* 118* 118* 144* 173   < > = values in this interval not displayed.   Basic Metabolic Panel: Recent Labs  Lab 06/26/19 0620 06/27/19 1122 06/28/19 0517 06/29/19 0509 06/30/19 0440  NA 126* 131* 133* 134* 136  K 4.4 4.3 4.0 3.7 3.9  CL 89* 93* 99 100 100  CO2 23 22 24 27 28   GLUCOSE 104* 109* 95 107* 113*  BUN 29* 29* 23 19 17   CREATININE 1.65* 1.57* 1.36* 1.30* 1.02  CALCIUM 8.6* 8.2* 8.0* 8.0* 8.0*   GFR: Estimated Creatinine Clearance: 88.7 mL/min (by C-G formula based on SCr of 1.02 mg/dL). Liver Function Tests: Recent Labs  Lab 06/26/19 0620 06/27/19 1122 06/28/19 0517 06/29/19 0509 06/30/19 0440  AST 268* 125* 76* 68* 40  ALT 116* 97* 70* 62* 51*  ALKPHOS 70 50 44 53 61  BILITOT 0.8 0.7 0.8 0.8 0.6  PROT 7.5 7.0 6.2* 6.1* 6.1*  ALBUMIN 3.8 3.2* 2.8* 2.6* 2.4*   No results for input(s): LIPASE, AMYLASE in the last 168 hours. Recent Labs  Lab 06/25/19 2122  AMMONIA 25  Coagulation Profile: No results for input(s): INR, PROTIME in the last 168 hours. Cardiac Enzymes: Recent Labs  Lab 06/25/19 1335  CKTOTAL 344   BNP (last 3 results) No results for input(s): PROBNP in the last 8760 hours. HbA1C: No results for input(s): HGBA1C in the last 72 hours. CBG: Recent Labs  Lab 06/25/19 1226  GLUCAP 140*   Lipid Profile: No results for input(s): CHOL, HDL, LDLCALC, TRIG, CHOLHDL, LDLDIRECT in the last 72 hours. Thyroid Function Tests: No results for input(s): TSH, T4TOTAL, FREET4, T3FREE, THYROIDAB in the last 72 hours. Anemia Panel: No results for input(s): VITAMINB12, FOLATE, FERRITIN, TIBC, IRON, RETICCTPCT in the last 72 hours. Urine analysis:    Component Value Date/Time   COLORURINE YELLOW 06/25/2019 1144   APPEARANCEUR CLEAR 06/25/2019 1144   APPEARANCEUR Clear 02/27/2017 1555   LABSPEC 1.011 06/25/2019 1144   PHURINE 5.0 06/25/2019 1144    GLUCOSEU >=500 (A) 06/25/2019 1144   HGBUR SMALL (A) 06/25/2019 1144   BILIRUBINUR NEGATIVE 06/25/2019 1144   BILIRUBINUR Negative 02/27/2017 1555   KETONESUR 5 (A) 06/25/2019 1144   PROTEINUR NEGATIVE 06/25/2019 1144   NITRITE NEGATIVE 06/25/2019 1144   LEUKOCYTESUR NEGATIVE 06/25/2019 1144   Recent Results (from the past 240 hour(s))  Urine culture     Status: Abnormal   Collection Time: 06/25/19 12:02 PM   Specimen: Urine, Clean Catch  Result Value Ref Range Status   Specimen Description   Final    URINE, CLEAN CATCH Performed at [facility], [place] [address] [place], [place] [address]    Special Requests   Final    NONE Performed at [facility], [place] [address], [place], [place] [address]    Culture >=100,000 COLONIES/mL ENTEROCOCCUS FAECALIS (A)  Final   Report Status 06/28/2019 FINAL  Final   Organism ID, Bacteria ENTEROCOCCUS FAECALIS (A)  Final      Susceptibility   Enterococcus faecalis - MIC*    AMPICILLIN <=2 SENSITIVE Sensitive     NITROFURANTOIN <=16 SENSITIVE Sensitive     VANCOMYCIN 1 SENSITIVE Sensitive     * >=100,000 COLONIES/mL ENTEROCOCCUS FAECALIS  Culture, blood (routine x 2)     Status: Abnormal   Collection Time: 06/25/19  1:08 PM   Specimen: BLOOD  Result Value Ref Range Status   Specimen Description   Final    BLOOD RIGHT ANTECUBITAL Performed at [facility], [place] [address], [place], [place] [address]    Special Requests   Final    BOTTLES DRAWN AEROBIC AND ANAEROBIC Blood Culture adequate volume Performed at [place] [address] [place], [place] [address]    Culture  Setup Time   Final    GRAM POSITIVE COCCI IN BOTH AEROBIC AND ANAEROBIC BOTTLES CRITICAL RESULT CALLED TO, READ BACK BY AND VERIFIED WITH: M. RENZ PHARMD, AT 1017 06/26/19 BY Rush Landmark Performed at [facility], [place] [address], [place], [place] [address]    Culture ENTEROCOCCUS FAECALIS (A)  Final    Report Status 06/28/2019 FINAL  Final   Organism ID, Bacteria ENTEROCOCCUS FAECALIS  Final      Susceptibility   Enterococcus faecalis - MIC*    AMPICILLIN <=2 SENSITIVE Sensitive     VANCOMYCIN 1 SENSITIVE Sensitive     GENTAMICIN SYNERGY SENSITIVE Sensitive     * ENTEROCOCCUS FAECALIS  SARS Coronavirus 2 by RT PCR (hospital order, performed in [place] hospital lab) Nasopharyngeal Nasopharyngeal Swab     Status:  None   Collection Time: 06/25/19  1:08 PM   Specimen: Nasopharyngeal Swab  Result Value Ref Range Status   SARS Coronavirus 2 NEGATIVE NEGATIVE Final    Comment: (NOTE) SARS-CoV-2 target nucleic acids are NOT DETECTED.  The SARS-CoV-2 RNA is generally detectable in upper and lower respiratory specimens during the acute phase of infection. The lowest concentration of SARS-CoV-2 viral copies this assay can detect is 250 copies / mL. A negative result does not preclude SARS-CoV-2 infection and should not be used as the sole basis for treatment or other patient management decisions.  A negative result may occur with improper specimen collection / handling, submission of specimen other than nasopharyngeal swab, presence of viral mutation(s) within the areas targeted by this assay, and inadequate number of viral copies (<250 copies / mL). A negative result must be combined with clinical observations, patient history, and epidemiological information.  Fact Sheet for Patients:   StrictlyIdeas.no  Fact Sheet for Healthcare Providers: BankingDealers.co.za  This test is not yet approved or  cleared by the [place] FDA and has been authorized for detection and/or diagnosis of SARS-CoV-2 by FDA under an Emergency Use Authorization (EUA).  This EUA will remain in effect (meaning this test can be used) for the duration of the COVID-19 declaration under Section 564(b)(1) of the Act, 21 U.S.C. section 360bbb-3(b)(1), unless the  authorization is terminated or revoked sooner.  Performed at [facility], [place] [address], [place], [place] [address]   Blood Culture ID Panel (Reflexed)     Status: Abnormal   Collection Time: 06/25/19  1:08 PM  Result Value Ref Range Status   Enterococcus species DETECTED (A) NOT DETECTED Final    Comment: CRITICAL RESULT CALLED TO, READ BACK BY AND VERIFIED WITH: M. RENZ PHARMD, AT 6045 06/26/19 BY D. VANHOOK    Vancomycin resistance NOT DETECTED NOT DETECTED Final   Listeria monocytogenes NOT DETECTED NOT DETECTED Final   Staphylococcus species NOT DETECTED NOT DETECTED Final   Staphylococcus aureus (BCID) NOT DETECTED NOT DETECTED Final   Streptococcus species NOT DETECTED NOT DETECTED Final   Streptococcus agalactiae NOT DETECTED NOT DETECTED Final   Streptococcus pneumoniae NOT DETECTED NOT DETECTED Final   Streptococcus pyogenes NOT DETECTED NOT DETECTED Final   Acinetobacter baumannii NOT DETECTED NOT DETECTED Final   Enterobacteriaceae species NOT DETECTED NOT DETECTED Final   Enterobacter cloacae complex NOT DETECTED NOT DETECTED Final   Escherichia coli NOT DETECTED NOT DETECTED Final   Klebsiella oxytoca NOT DETECTED NOT DETECTED Final   Klebsiella pneumoniae NOT DETECTED NOT DETECTED Final   Proteus species NOT DETECTED NOT DETECTED Final   Serratia marcescens NOT DETECTED NOT DETECTED Final   Haemophilus influenzae NOT DETECTED NOT DETECTED Final   Neisseria meningitidis NOT DETECTED NOT DETECTED Final   Pseudomonas aeruginosa NOT DETECTED NOT DETECTED Final   Candida albicans NOT DETECTED NOT DETECTED Final   Candida glabrata NOT DETECTED NOT DETECTED Final   Candida krusei NOT DETECTED NOT DETECTED Final   Candida parapsilosis NOT DETECTED NOT DETECTED Final   Candida tropicalis NOT DETECTED NOT DETECTED Final    Comment: Performed at [facility], [place] [address], [place], [place] [address]  Culture, blood (routine x 2)     Status: None    Collection Time: 06/25/19  1:52 PM   Specimen: BLOOD LEFT HAND  Result Value Ref Range Status   Specimen Description   Final    BLOOD LEFT HAND Performed at [facility], [place] [address], [place], [place] [address]    Special Requests   Final    BOTTLES DRAWN AEROBIC AND ANAEROBIC Blood Culture adequate volume Performed at [place] [address], [place], [place] [address]    Culture   Final    NO GROWTH 5 DAYS Performed at [facility], [place] [address], [place], [place] [address]    Report Status 06/30/2019 FINAL  Final  Culture, blood (routine x 2)     Status: None (Preliminary result)   Collection Time: 06/27/19 11:22 AM   Specimen: BLOOD  Result Value Ref Range Status   Specimen Description   Final    BLOOD RIGHT HAND Performed at [place] [address], [place], [place] [address]    Special Requests   Final    BOTTLES DRAWN AEROBIC AND ANAEROBIC Blood Culture adequate volume Performed at [place] [address] [place], [place] [address]    Culture   Final    NO GROWTH 3 DAYS Performed at [facility], [place] [address] [place], [place] [address]    Report Status PENDING  Incomplete  Culture, blood (routine x 2)     Status: None (Preliminary result)   Collection Time: 06/27/19 11:22 AM   Specimen: BLOOD  Result Value Ref Range Status   Specimen Description   Final    BLOOD LEFT HAND Performed at [place] [address], [place], [place] [address]    Special Requests   Final    BOTTLES DRAWN AEROBIC ONLY Blood Culture adequate volume Performed at [place] [address], [place], [place] [address]    Culture   Final    NO GROWTH 3 DAYS Performed at [facility], [place] [address], [place], [place] [address]    Report Status PENDING  Incomplete      Radiology Studies: DG CHEST PORT 1 VIEW  Result Date:  06/29/2019 CLINICAL DATA:  Shortness of breath EXAM: PORTABLE CHEST 1 VIEW COMPARISON:  06/27/2019 FINDINGS: Hazy opacification of the bilateral chest which is unchanged. Borderline heart size which is distorted by scoliosis. No pneumothorax or visible effusion. IMPRESSION: Stable hazy chest opacification favoring atelectasis. Electronically Signed   By: Monte Fantasia M.D.   On: 06/29/2019 11:40   ECHO TEE  Result Date: 06/29/2019    TRANSESOPHOGEAL ECHO REPORT   Patient Name:   Louis [name] Date of Exam: 06/29/2019 Medical Rec #:  [id number]    Height:       71.0 in Accession #:    [id number]   Weight:       220.5 lb Date of Birth:  [birth year]    BSA:          2.198 m Patient Age:    44 years     BP:           158/87 mmHg Patient Gender: M            HR:           91 bpm. Exam Location:  Inpatient Procedure: Transesophageal Echo Indications:    bacteremia  History:        Patient has prior history of Echocardiogram examinations, most                 recent 06/27/2019. CHF, COPD; Risk Factors:Hypertension.  Sonographer:    Jannett Celestine RDCS (AE) Referring Phys: [address] [place]:  The transesophogeal probe was passed without difficulty through the esophogus of the patient. Sedation performed by different physician. The patient developed no complications during the procedure. IMPRESSIONS  1. Left ventricular ejection fraction, by estimation, is 60 to 65%. The left ventricle has normal function. The left ventricle has no regional wall motion abnormalities.  2. Right ventricular systolic function is normal. The right ventricular size is normal.  3. No left atrial/left atrial appendage thrombus was detected.  4. The mitral valve is grossly normal. Mild to moderate mitral valve regurgitation. No evidence of mitral stenosis.  5. The aortic valve is normal in structure. Aortic valve regurgitation is trivial. No aortic stenosis is present. FINDINGS  Left Ventricle: Left ventricular ejection fraction, by  estimation, is 60 to 65%. The left ventricle has normal function. The left ventricle has no regional wall motion abnormalities. The left ventricular internal cavity size was normal in size. There is  no left ventricular hypertrophy. Right Ventricle: The right ventricular size is normal. No increase in right ventricular wall thickness. Right ventricular systolic function is normal. Left Atrium: Left atrial size was normal in size. No left atrial/left atrial appendage thrombus was detected. Right Atrium: Right atrial size was normal in size. Pericardium: There is no evidence of pericardial effusion. Mitral Valve: The mitral valve is grossly normal. Mild to moderate mitral valve regurgitation. No evidence of mitral valve stenosis. There is no evidence of mitral valve vegetation. Tricuspid Valve: The tricuspid valve is grossly normal. Tricuspid valve regurgitation is mild . No evidence of tricuspid stenosis. Aortic Valve: The aortic valve is normal in structure. Aortic valve regurgitation is trivial. No aortic stenosis is present. There is no evidence of aortic valve vegetation. Pulmonic Valve: The pulmonic valve was grossly normal. Pulmonic valve regurgitation is mild. No evidence of pulmonic stenosis. Aorta: The aortic root and ascending aorta are structurally normal, with no evidence of dilitation. IAS/Shunts: The atrial septum is grossly normal. [name] MD Electronically signed by [name] MD Signature Date/Time: 06/29/2019/3:37:18 PM    Final     Scheduled Meds: . amLODipine  5 mg Oral Daily  . feeding supplement (ENSURE ENLIVE)  237 mL Oral BID BM  . feeding supplement (PRO-STAT SUGAR FREE 64)  30 mL Oral BID  . folic acid  1 mg Oral Daily  . gabapentin  300 mg Oral BID  . heparin  5,000 Units Subcutaneous Q8H  . metoprolol succinate  150 mg Oral Daily  . multivitamin with minerals  1 tablet Oral Daily  . omeprazole  20 mg Oral Daily  . [START ON 07/02/2019] thiamine  100 mg Oral Daily    Continuous Infusions: . DAPTOmycin (CUBICIN)  IV Stopped (06/29/19 2125)  . thiamine injection 500 mg (06/30/19 1026)     LOS: 5 days   Total time spent 35 minutes in seeing, interviewing, examining patient, coordinating patient care with consultants, greater than 50% of which was spent on the floor and/or at the bedside.   Patrecia Pour, MD [facility] [email or web address] 06/30/2019, 3:39 PM

## 2019-06-30 NOTE — TOC Initial Note (Signed)
Transition of Care [facility]) - Initial/Assessment Note    Patient Details  Name: Louis [name] MRN: [id number] Date of Birth: 04/13/1955  Transition of Care [facility]) CM/SW Contact:    Joaquin Courts, RN Phone Number: 06/30/2019, 2:43 PM  Clinical Narrative:   CM spoke with CIR admission CM.  CIR rep reports that IV ABX/ PICC line will not present any problems for CIR admission.  At this time it is unclear how soon a bed will be available but rep anticipated having more information about this after their 930 am meeting.  Rep reports she will initiate insurance auth and hopes to have that complete within 24 hours.                  Expected Discharge Plan: IP Rehab Facility Barriers to Discharge: Continued Medical Work up   Patient Goals and CMS Choice Patient states their goals for this hospitalization and ongoing recovery are:: to go to CIR CMS Medicare.gov Compare Post Acute Care list provided to:: Patient Choice offered to / list presented to : Patient  Expected Discharge Plan and Services Expected Discharge Plan: [place]   Discharge Planning Services: CM Consult Post Acute Care Choice: IP Rehab Living arrangements for the past 2 months: Single Family Home                                      Prior Living Arrangements/Services Living arrangements for the past 2 months: Single Family Home Lives with:: Spouse Patient language and need for interpreter reviewed:: No Do you feel safe going back to the place where you live?: Yes        Care giver support system in place?: Yes (comment)      Activities of Daily Living Home Assistive Devices/Equipment: CPAP, Walker (specify type), Oxygen, Other (Comment) ADL Screening (condition at time of admission) [name] cognitive ability adequate to safely complete daily activities?: Yes Is the patient deaf or have difficulty hearing?: No Does the patient have difficulty seeing, even when wearing glasses/contacts?:  No Does the patient have difficulty concentrating, remembering, or making decisions?: Yes (some memory issue) Patient able to express need for assistance with ADLs?: Yes Does the patient have difficulty dressing or bathing?: Yes Independently performs ADLs?: No Communication: Independent Dressing (OT): Needs assistance Is this a change from baseline?: Pre-admission baseline Grooming: Independent Feeding: Independent Bathing: Needs assistance Is this a change from baseline?: Pre-admission baseline Toileting: Needs assistance Is this a change from baseline?: Pre-admission baseline In/Out Bed: Needs assistance Is this a change from baseline?: Pre-admission baseline Walks in Home: Dependent Is this a change from baseline?: Pre-admission baseline Does the patient have difficulty walking or climbing stairs?: Yes Weakness of Legs: Both Weakness of Arms/Hands: Both (tremors)  Permission Sought/Granted                  Emotional Assessment Appearance:: Appears stated age     Orientation: : Oriented to Self, Oriented to Place, Oriented to  Time, Oriented to Situation      Admission diagnosis:  Hyperkalemia [E87.5] ARF (acute renal failure) (HCC) [N17.9] Weakness [R53.1] AKI (acute kidney injury) ([place]) [N17.9] Patient Active Problem List   Diagnosis Date Noted  . Bacteremia due to Enterococcus 06/26/2019  . ARF (acute renal failure) ([place]) 06/25/2019  . Perforated viscus 01/06/2019  . Severe sepsis with septic shock ([place]) 01/06/2019  . Acute renal failure ([place]) 01/06/2019  .  Hyperkalemia 01/06/2019  . Hyponatremia 01/06/2019  . Lactic acidosis 01/06/2019  . Onychomycosis 05/19/2018  . Foot fracture, left, closed, initial encounter 05/09/2018  . Lisfranc dislocation, left, initial encounter   . Acute bronchitis 02/25/2018  . Primary pulmonary hypertension ([place]) 07/09/2017  . Diastolic dysfunction 59/93/5701  . OSA (obstructive sleep apnea) 07/08/2017  . SOB (shortness of  breath) 07/02/2017  . Chronic respiratory failure with hypoxia and hypercapnia ([place]) 07/02/2017  . Cardiomegaly 07/02/2017  . Exposure keratitis 02/17/2017  . Pedal edema 12/18/2016  . Transaminitis 12/18/2016  . Bilateral pes planus 12/18/2016  . Psoriasis-eczema overlap condition 07/30/2016  . High serum high density lipoprotein (HDL) 09/26/2015  . Epistaxis 09/26/2015  . Renal artery stenosis in 1 of 2 vessels ([place])   . Essential tremor   . Elevated PSA   . GERD (gastroesophageal reflux disease)   . Health maintenance examination 10/08/2014  . Anxiety state 10/08/2014  . Obesity, Class I, BMI 30-34.9   . Mild intermittent asthma in adult without complication   . Seasonal allergies   . HTN (hypertension)   . Scoliosis   . Osteoporosis    PCP:  Rutherford Guys, MD Pharmacy:   [facility] [address] [place] AT [place] [address] Phone: [phone] Fax: [phone]     Social Determinants of Health (SDOH) Interventions    Readmission Risk Interventions No flowsheet data found.

## 2019-06-30 NOTE — Plan of Care (Signed)
  Problem: Clinical Measurements: Goal: Respiratory complications will improve Outcome: Progressing   Problem: Clinical Measurements: Goal: Cardiovascular complication will be avoided Outcome: Progressing   Problem: Activity: Goal: Risk for activity intolerance will decrease Outcome: Progressing   Problem: Nutrition: Goal: Adequate nutrition will be maintained Outcome: Progressing   Problem: Coping: Goal: Level of anxiety will decrease Outcome: Progressing   Problem: Elimination: Goal: Will not experience complications related to bowel motility Outcome: Progressing   Problem: Safety: Goal: Ability to remain free from injury will improve Outcome: Progressing   Problem: Skin Integrity: Goal: Risk for impaired skin integrity will decrease Outcome: Progressing

## 2019-06-30 NOTE — Progress Notes (Signed)
ID PROGRESS NOTE  Patient is afebrile over the last 24hrs. Working with PT  A/P: enterococcal bacteremia with possible urinary source since + urine cx, though he was asymptomatic. TEE ruled out endocarditis.  Since slow response to therapy, recommend to use day 1 as 6/28 Recommend 2 wk of treatment. Will continue with daptomycin Can place picc line if he remains afebrile  Onedia [facility] for Infectious Diseases [phone]

## 2019-07-01 ENCOUNTER — Inpatient Hospital Stay (HOSPITAL_COMMUNITY): Payer: 59

## 2019-07-01 ENCOUNTER — Inpatient Hospital Stay (HOSPITAL_COMMUNITY): Payer: 59 | Admitting: Registered Nurse

## 2019-07-01 ENCOUNTER — Encounter (HOSPITAL_COMMUNITY): Payer: Self-pay | Admitting: Cardiovascular Disease

## 2019-07-01 DIAGNOSIS — R0603 Acute respiratory distress: Secondary | ICD-10-CM

## 2019-07-01 DIAGNOSIS — Y95 Nosocomial condition: Secondary | ICD-10-CM

## 2019-07-01 DIAGNOSIS — B952 Enterococcus as the cause of diseases classified elsewhere: Secondary | ICD-10-CM

## 2019-07-01 DIAGNOSIS — R7881 Bacteremia: Secondary | ICD-10-CM

## 2019-07-01 DIAGNOSIS — J189 Pneumonia, unspecified organism: Secondary | ICD-10-CM

## 2019-07-01 DIAGNOSIS — N390 Urinary tract infection, site not specified: Secondary | ICD-10-CM

## 2019-07-01 LAB — BLOOD GAS, ARTERIAL
Acid-Base Excess: 4.1 mmol/L — ABNORMAL HIGH (ref 0.0–2.0)
Acid-base deficit: 1.3 mmol/L (ref 0.0–2.0)
Bicarbonate: 33.5 mmol/L — ABNORMAL HIGH (ref 20.0–28.0)
Bicarbonate: 30.6 mmol/L — ABNORMAL HIGH (ref 20.0–28.0)
Drawn by: 225631
Drawn by: 270211
FIO2: 100
FIO2: 100
MECHVT: 590 mL
O2 Content: 15 L/min
O2 Content: 100 L/min
O2 Saturation: 96.3 %
O2 Saturation: 99.7 %
Patient temperature: 97.4
Patient temperature: 98.6
RATE: 18 resp/min
RATE: 24 resp/min
pCO2 arterial: >120 mmHg (ref 32.0–48.0)
pCO2 arterial: 60.5 mmHg — ABNORMAL HIGH (ref 32.0–48.0)
pH, Arterial: 7.002 — CL (ref 7.350–7.450)
pH, Arterial: 7.324 — ABNORMAL LOW (ref 7.350–7.450)
pO2, Arterial: 112 mmHg — ABNORMAL HIGH (ref 83.0–108.0)
pO2, Arterial: 203 mmHg — ABNORMAL HIGH (ref 83.0–108.0)

## 2019-07-01 LAB — BODY FLUID CELL COUNT WITH DIFFERENTIAL
Eos, Fluid: 1 %
Lymphs, Fluid: 22 %
Monocyte-Macrophage-Serous Fluid: 30 % — ABNORMAL LOW (ref 50–90)
Neutrophil Count, Fluid: 47 % — ABNORMAL HIGH (ref 0–25)
Total Nucleated Cell Count, Fluid: 19 cu mm (ref 0–1000)

## 2019-07-01 LAB — COMPREHENSIVE METABOLIC PANEL
ALT: 48 U/L — ABNORMAL HIGH (ref 0–44)
ALT: 51 U/L — ABNORMAL HIGH (ref 0–44)
AST: 36 U/L (ref 15–41)
AST: 46 U/L — ABNORMAL HIGH (ref 15–41)
Albumin: 2.7 g/dL — ABNORMAL LOW (ref 3.5–5.0)
Albumin: 2.8 g/dL — ABNORMAL LOW (ref 3.5–5.0)
Alkaline Phosphatase: 74 U/L (ref 38–126)
Alkaline Phosphatase: 74 U/L (ref 38–126)
Anion gap: 5 (ref 5–15)
Anion gap: 11 (ref 5–15)
BUN: 22 mg/dL (ref 8–23)
BUN: 22 mg/dL (ref 8–23)
CO2: 35 mmol/L — ABNORMAL HIGH (ref 22–32)
CO2: 29 mmol/L (ref 22–32)
Calcium: 8.7 mg/dL — ABNORMAL LOW (ref 8.9–10.3)
Calcium: 8.7 mg/dL — ABNORMAL LOW (ref 8.9–10.3)
Chloride: 101 mmol/L (ref 98–111)
Chloride: 99 mmol/L (ref 98–111)
Creatinine, Ser: 1.04 mg/dL (ref 0.61–1.24)
Creatinine, Ser: 1.35 mg/dL — ABNORMAL HIGH (ref 0.61–1.24)
GFR calc Af Amer: >60 mL/min (ref >60)
GFR calc Af Amer: >60 mL/min (ref >60)
GFR calc non Af Amer: >60 mL/min (ref >60)
GFR calc non Af Amer: 56 mL/min — ABNORMAL LOW (ref >60)
Glucose, Bld: 133 mg/dL — ABNORMAL HIGH (ref 70–99)
Glucose, Bld: 179 mg/dL — ABNORMAL HIGH (ref 70–99)
Potassium: 4 mmol/L (ref 3.5–5.1)
Potassium: 5.3 mmol/L — ABNORMAL HIGH (ref 3.5–5.1)
Sodium: 141 mmol/L (ref 135–145)
Sodium: 139 mmol/L (ref 135–145)
Total Bilirubin: 0.5 mg/dL (ref 0.3–1.2)
Total Bilirubin: 0.4 mg/dL (ref 0.3–1.2)
Total Protein: 6.2 g/dL — ABNORMAL LOW (ref 6.5–8.1)
Total Protein: 6.8 g/dL (ref 6.5–8.1)

## 2019-07-01 LAB — URINALYSIS, ROUTINE W REFLEX MICROSCOPIC
Bacteria, UA: NONE SEEN
Bilirubin Urine: NEGATIVE
Glucose, UA: >=500 mg/dL — AB
Ketones, ur: NEGATIVE mg/dL
Leukocytes,Ua: NEGATIVE
Nitrite: NEGATIVE
Protein, ur: 100 mg/dL — AB
Specific Gravity, Urine: 1.012 (ref 1.005–1.030)
pH: 6 (ref 5.0–8.0)

## 2019-07-01 LAB — CBC WITH DIFFERENTIAL/PLATELET
Abs Immature Granulocytes: 0.14 10*3/uL — ABNORMAL HIGH (ref 0.00–0.07)
Basophils Absolute: 0 10*3/uL (ref 0.0–0.1)
Basophils Relative: 0 %
Eosinophils Absolute: 0 10*3/uL (ref 0.0–0.5)
Eosinophils Relative: 1 %
HCT: 32.2 % — ABNORMAL LOW (ref 39.0–52.0)
Hemoglobin: 9.9 g/dL — ABNORMAL LOW (ref 13.0–17.0)
Immature Granulocytes: 3 %
Lymphocytes Relative: 9 %
Lymphs Abs: 0.4 10*3/uL — ABNORMAL LOW (ref 0.7–4.0)
MCH: 34.5 pg — ABNORMAL HIGH (ref 26.0–34.0)
MCHC: 30.7 g/dL (ref 30.0–36.0)
MCV: 112.2 fL — ABNORMAL HIGH (ref 80.0–100.0)
Monocytes Absolute: 0.7 10*3/uL (ref 0.1–1.0)
Monocytes Relative: 13 %
Neutro Abs: 3.9 10*3/uL (ref 1.7–7.7)
Neutrophils Relative %: 74 %
Platelets: 339 10*3/uL (ref 150–400)
RBC: 2.87 MIL/uL — ABNORMAL LOW (ref 4.22–5.81)
RDW: 13.4 % (ref 11.5–15.5)
WBC: 5.2 10*3/uL (ref 4.0–10.5)
nRBC: 0 % (ref 0.0–0.2)

## 2019-07-01 LAB — GLUCOSE, CAPILLARY
Glucose-Capillary: 155 mg/dL — ABNORMAL HIGH (ref 70–99)
Glucose-Capillary: 135 mg/dL — ABNORMAL HIGH (ref 70–99)
Glucose-Capillary: 98 mg/dL (ref 70–99)
Glucose-Capillary: 116 mg/dL — ABNORMAL HIGH (ref 70–99)
Glucose-Capillary: 124 mg/dL — ABNORMAL HIGH (ref 70–99)

## 2019-07-01 LAB — BASIC METABOLIC PANEL
Anion gap: 9 (ref 5–15)
BUN: 27 mg/dL — ABNORMAL HIGH (ref 8–23)
CO2: 31 mmol/L (ref 22–32)
Calcium: 8.6 mg/dL — ABNORMAL LOW (ref 8.9–10.3)
Chloride: 102 mmol/L (ref 98–111)
Creatinine, Ser: 1.24 mg/dL (ref 0.61–1.24)
GFR calc Af Amer: >60 mL/min (ref >60)
GFR calc non Af Amer: >60 mL/min (ref >60)
Glucose, Bld: 115 mg/dL — ABNORMAL HIGH (ref 70–99)
Potassium: 3.4 mmol/L — ABNORMAL LOW (ref 3.5–5.1)
Sodium: 142 mmol/L (ref 135–145)

## 2019-07-01 LAB — PHOSPHORUS
Phosphorus: 5.4 mg/dL — ABNORMAL HIGH (ref 2.5–4.6)
Phosphorus: <1 mg/dL — CL (ref 2.5–4.6)

## 2019-07-01 LAB — CBC
HCT: 30.6 % — ABNORMAL LOW (ref 39.0–52.0)
Hemoglobin: 9.4 g/dL — ABNORMAL LOW (ref 13.0–17.0)
MCH: 34.1 pg — ABNORMAL HIGH (ref 26.0–34.0)
MCHC: 30.7 g/dL (ref 30.0–36.0)
MCV: 110.9 fL — ABNORMAL HIGH (ref 80.0–100.0)
Platelets: 304 10*3/uL (ref 150–400)
RBC: 2.76 MIL/uL — ABNORMAL LOW (ref 4.22–5.81)
RDW: 13.4 % (ref 11.5–15.5)
WBC: 6.6 10*3/uL (ref 4.0–10.5)
nRBC: 0 % (ref 0.0–0.2)

## 2019-07-01 LAB — CREATININE, URINE, RANDOM: Creatinine, Urine: 102.36 mg/dL

## 2019-07-01 LAB — LACTIC ACID, PLASMA
Lactic Acid, Venous: 0.8 mmol/L (ref 0.5–1.9)
Lactic Acid, Venous: 1.4 mmol/L (ref 0.5–1.9)

## 2019-07-01 LAB — MAGNESIUM
Magnesium: 1.7 mg/dL (ref 1.7–2.4)
Magnesium: 1.6 mg/dL — ABNORMAL LOW (ref 1.7–2.4)

## 2019-07-01 LAB — TROPONIN I (HIGH SENSITIVITY)
Troponin I (High Sensitivity): 13 ng/L (ref <18)
Troponin I (High Sensitivity): 16 ng/L (ref <18)

## 2019-07-01 LAB — SODIUM, URINE, RANDOM: Sodium, Ur: 24 mmol/L

## 2019-07-01 LAB — AMMONIA
Ammonia: 75 umol/L — ABNORMAL HIGH (ref 9–35)
Ammonia: 34 umol/L (ref 9–35)

## 2019-07-01 LAB — CORTISOL: Cortisol, Plasma: 19.4 ug/dL

## 2019-07-01 MED ORDER — CHLORHEXIDINE GLUCONATE 0.12% ORAL RINSE (MEDLINE KIT)
15.0000 mL | Freq: Two times a day (BID) | OROMUCOSAL | Status: DC
  Administered 2019-07-01 – 2019-07-02 (×3): 15 mL via OROMUCOSAL

## 2019-07-01 MED ORDER — FENTANYL CITRATE (PF) 100 MCG/2ML IJ SOLN: 50.0000 ug | INTRAMUSCULAR | Status: DC | PRN

## 2019-07-01 MED ORDER — PRO-STAT SUGAR FREE PO LIQD: 30.0000 mL | Freq: Two times a day (BID) | ORAL | Status: DC

## 2019-07-01 MED ORDER — PROPOFOL 1000 MG/100ML IV EMUL
0.0000 ug/kg/min | INTRAVENOUS | Status: DC
  Administered 2019-07-01: 20 ug/kg/min via INTRAVENOUS
  Administered 2019-07-01: 5 ug/kg/min via INTRAVENOUS
  Administered 2019-07-01: 30 ug/kg/min via INTRAVENOUS
  Administered 2019-07-02: 50 ug/kg/min via INTRAVENOUS
  Administered 2019-07-02: 40 ug/kg/min via INTRAVENOUS
  Filled 2019-07-01 (×5): qty 100

## 2019-07-01 MED ORDER — VITAL AF 1.2 CAL PO LIQD
1000.0000 mL | ORAL | Status: DC
  Administered 2019-07-01 – 2019-07-02 (×2): 1000 mL

## 2019-07-01 MED ORDER — ACETAMINOPHEN 160 MG/5ML PO SOLN
650.0000 mg | Freq: Four times a day (QID) | ORAL | Status: DC | PRN
  Administered 2019-07-01: 650 mg
  Filled 2019-07-01: qty 20.3

## 2019-07-01 MED ORDER — FENTANYL CITRATE (PF) 100 MCG/2ML IJ SOLN
50.0000 ug | INTRAMUSCULAR | Status: DC | PRN
  Administered 2019-07-01: 100 ug via INTRAVENOUS
  Filled 2019-07-01: qty 4

## 2019-07-01 MED ORDER — SODIUM CHLORIDE 0.9 % IV SOLN
2.0000 g | INTRAVENOUS | Status: DC
  Administered 2019-07-01 (×2): 2 g via INTRAVENOUS
  Filled 2019-07-01 (×2): qty 2
  Filled 2019-07-01: qty 2000

## 2019-07-01 MED ORDER — DOCUSATE SODIUM 50 MG/5ML PO LIQD
100.0000 mg | Freq: Two times a day (BID) | ORAL | Status: DC
  Administered 2019-07-01 – 2019-07-02 (×2): 100 mg
  Filled 2019-07-01 (×2): qty 10

## 2019-07-01 MED ORDER — ORAL CARE MOUTH RINSE
15.0000 mL | OROMUCOSAL | Status: DC
  Administered 2019-07-01 – 2019-07-02 (×8): 15 mL via OROMUCOSAL

## 2019-07-01 MED ORDER — SODIUM CHLORIDE 0.9 % IV SOLN
2.0000 g | Freq: Three times a day (TID) | INTRAVENOUS | Status: DC
  Administered 2019-07-02 – 2019-07-04 (×8): 2 g via INTRAVENOUS
  Filled 2019-07-01 (×12): qty 2

## 2019-07-01 MED ORDER — MIDAZOLAM HCL 2 MG/2ML IJ SOLN: 2.0000 mg | INTRAMUSCULAR | Status: DC | PRN

## 2019-07-01 MED ORDER — DOCUSATE SODIUM 50 MG/5ML PO LIQD: 100.0000 mg | Freq: Two times a day (BID) | ORAL | Status: DC

## 2019-07-01 MED ORDER — ETOMIDATE 2 MG/ML IV SOLN
20.0000 mg | Freq: Once | INTRAVENOUS | Status: AC
  Administered 2019-07-01: 20 mg via INTRAVENOUS

## 2019-07-01 MED ORDER — LINEZOLID 600 MG/300ML IV SOLN
600.0000 mg | Freq: Two times a day (BID) | INTRAVENOUS | Status: DC
  Administered 2019-07-01 – 2019-07-03 (×4): 600 mg via INTRAVENOUS
  Filled 2019-07-01 (×4): qty 300

## 2019-07-01 MED ORDER — IPRATROPIUM-ALBUTEROL 0.5-2.5 (3) MG/3ML IN SOLN
3.0000 mL | Freq: Four times a day (QID) | RESPIRATORY_TRACT | Status: DC
  Administered 2019-07-01 – 2019-07-02 (×5): 3 mL via RESPIRATORY_TRACT
  Filled 2019-07-01 (×5): qty 3

## 2019-07-01 MED ORDER — VITAL HIGH PROTEIN PO LIQD: 1000.0000 mL | ORAL | Status: DC

## 2019-07-01 MED ORDER — POLYETHYLENE GLYCOL 3350 17 G PO PACK
17.0000 g | PACK | Freq: Every day | ORAL | Status: DC
  Administered 2019-07-02: 17 g
  Filled 2019-07-01: qty 1

## 2019-07-01 MED ORDER — SUCCINYLCHOLINE CHLORIDE 20 MG/ML IJ SOLN
INTRAMUSCULAR | Status: DC | PRN
  Administered 2019-07-01: 80 mg via INTRAVENOUS

## 2019-07-01 MED ORDER — ETOMIDATE 2 MG/ML IV SOLN
INTRAVENOUS | Status: AC
  Filled 2019-07-01: qty 10

## 2019-07-01 MED ORDER — MIDAZOLAM HCL 2 MG/2ML IJ SOLN
2.0000 mg | INTRAMUSCULAR | Status: DC | PRN
  Administered 2019-07-01: 2 mg via INTRAVENOUS
  Filled 2019-07-01: qty 2

## 2019-07-01 MED ORDER — FUROSEMIDE 10 MG/ML IJ SOLN
40.0000 mg | INTRAMUSCULAR | Status: AC
  Filled 2019-07-01: qty 4

## 2019-07-01 MED ORDER — FAMOTIDINE IN NACL 20-0.9 MG/50ML-% IV SOLN
20.0000 mg | Freq: Two times a day (BID) | INTRAVENOUS | Status: DC
  Administered 2019-07-01 – 2019-07-02 (×3): 20 mg via INTRAVENOUS
  Filled 2019-07-01 (×3): qty 50

## 2019-07-01 MED ORDER — SODIUM PHOSPHATES 45 MMOLE/15ML IV SOLN
10.0000 mmol | Freq: Once | INTRAVENOUS | Status: AC
  Administered 2019-07-01: 10 mmol via INTRAVENOUS
  Filled 2019-07-01: qty 3.33

## 2019-07-01 MED ORDER — PRO-STAT SUGAR FREE PO LIQD
30.0000 mL | Freq: Every day | ORAL | Status: DC
  Administered 2019-07-02: 30 mL
  Filled 2019-07-01: qty 30

## 2019-07-01 NOTE — Progress Notes (Signed)
CRITICAL VALUE ALERT  Critical Value:  Phos <1  Date & Time Notied:  07/01/19 7322  Provider Notified: Dr. Lake Bells  Orders Received/Actions taken: Awaiting new orders

## 2019-07-01 NOTE — Progress Notes (Signed)
Pt. seen for cpap, not ready @ this time, RT to recheck on rounds, pt. remains on 2.5 lpm n/c.

## 2019-07-01 NOTE — Progress Notes (Signed)
Inpatient Rehab Admissions Coordinator:   Note events of this morning.  Will follow from a distance.   Shann Medal, PT, DPT Admissions Coordinator [phone] 07/01/19  10:22 AM

## 2019-07-01 NOTE — Progress Notes (Addendum)
[name] for Infectious Disease    Date of Admission:  06/25/2019   Total days of antibiotics 7          ID: Louis [name] is a 64 y.o. male with  Enterococcal bacteremia and UTI Active Problems:   HTN (hypertension)   Anxiety state   Essential tremor   Renal artery stenosis in 1 of 2 vessels (HCC)   Transaminitis   SOB (shortness of breath)   Chronic respiratory failure with hypoxia and hypercapnia (HCC)   OSA (obstructive sleep apnea)   Hyperkalemia   Hyponatremia   ARF (acute renal failure) (HCC)   Bacteremia due to Enterococcus    Subjective: Yesterday had desaturation while working with PT, received lasix due to concern for pulm edema, which improved slightly per wife's report, however early this morning was found unresponsive with hypercarbic acidosis requiring intubation. He had not worn his CPAP overnight but his wife reports that he has in the past done fine with missing a night or 2 of cpap.  He underwent bronchoscopy for respiratory sample and to evaluate for eosinophilic pneumonia since he had been on daptomycin for 3 days.  Remains intubated/sedated Medications:  . amLODipine  5 mg Oral Daily  . chlorhexidine gluconate (MEDLINE KIT)  15 mL Mouth Rinse BID  . Chlorhexidine Gluconate Cloth  6 each Topical Daily  . docusate  100 mg Per Tube BID  . etomidate      . [START ON 07/02/2019] feeding supplement (PRO-STAT SUGAR FREE 64)  30 mL Per Tube Daily  . folic acid  1 mg Oral Daily  . furosemide  40 mg Intravenous STAT  . gabapentin  300 mg Oral BID  . heparin  5,000 Units Subcutaneous Q8H  . ipratropium-albuterol  3 mL Nebulization Q6H  . mouth rinse  15 mL Mouth Rinse 10 times per day  . metoprolol succinate  150 mg Oral Daily  . multivitamin with minerals  1 tablet Oral Daily  . omeprazole  20 mg Oral Daily  . polyethylene glycol  17 g Per Tube Daily  . [START ON 07/02/2019] thiamine  100 mg Oral Daily    Objective: Vital signs in last 24 hours:  Temp:  [96.6 F (35.9 C)-102.4 F (39.1 C)] 100.2 F (37.9 C) (06/30 1600) Pulse Rate:  [61-91] 70 (06/30 1600) Resp:  [16-36] 16 (06/30 1600) BP: (71-147)/(34-82) 129/55 (06/30 1600) SpO2:  [67 %-100 %] 100 % (06/30 1600) FiO2 (%):  [40 %-100 %] 40 % (06/30 1600) Weight:  [95.2 kg] 95.2 kg (06/30 0421) Physical Exam  Constitutional: He is intubated. He appears well-developed and well-nourished. No distress.  HENT: OETT in place Neuro = winces when touching him. Moved arms spontaneously, Cardiovascular: Normal rate, regular rhythm and normal heart sounds. Exam reveals no gallop and no friction rub.  No murmur heard.  Pulmonary/Chest: Effort normal and breath sounds normal. No respiratory distress. He has no wheezes.  Skin: Skin is warm and dry. No rash noted. No erythema.   Lab Results Recent Labs    07/01/19 0316 07/01/19 0316 07/01/19 0738 07/01/19 1509  WBC 6.6  --  5.2  --   HGB 9.4*  --  9.9*  --   HCT 30.6*  --  32.2*  --   NA 141   < > 139 142  K 4.0   < > 5.3* 3.4*  CL 101   < > 99 102  CO2 35*   < > 29 31  BUN 22   < > 22 27*  CREATININE 1.04   < > 1.35* 1.24   < > = values in this interval not displayed.   Liver Panel Recent Labs    07/01/19 0316 07/01/19 0738  PROT 6.2* 6.8  ALBUMIN 2.7* 2.8*  AST 36 46*  ALT 48* 51*  ALKPHOS 74 74  BILITOT 0.5 0.4   BAL - neutrophil predominace Microbiology: 6/30 BAL pending 6/26 blood cx ngtd 6/24 blood cx 2 of 4 bottles e.faecalis Enterococcus faecalis      MIC    AMPICILLIN <=2 SENSITIVE  Sensitive    GENTAMICIN SYNERGY SENSITIVE  Sensitive    VANCOMYCIN 1 SENSITIVE  Sensitive     6/24 urine cx e.faecalis Studies/Results: DG Abd 1 View  Result Date: 07/01/2019 CLINICAL DATA:  Nasogastric tube placement EXAM: ABDOMEN - 1 VIEW COMPARISON:  January 08, 2019 FINDINGS: Nasogastric tube tip and side port are in the proximal stomach. No evident bowel dilatation or air-fluid levels. No free air. Atelectatic  change noted in right lung base. Marked lower thoracic and lumbar scoliosis noted. IMPRESSION: Nasogastric tube tip and side port in proximal stomach. No bowel obstruction or free air appreciable. Marked scoliosis. Electronically Signed   By: Lowella Grip III M.D.   On: 07/01/2019 08:39   CT HEAD WO CONTRAST  Result Date: 07/01/2019 CLINICAL DATA:  Mental status change with unknown cause. Hypercarbia and hypoxemia EXAM: CT HEAD WITHOUT CONTRAST TECHNIQUE: Contiguous axial images were obtained from the base of the skull through the vertex without intravenous contrast. COMPARISON:  06/25/2019 brain MRI FINDINGS: Brain: No evidence of acute infarction, hemorrhage, hydrocephalus, extra-axial collection or mass lesion/mass effect. Generalized cortical and cerebellar atrophy greater than expected for age. Vascular: No hyperdense vessel or unexpected calcification. Skull: Normal. Negative for fracture or focal lesion. Sinuses/Orbits: No acute finding. Other: [name] oropharyngeal catheter loops through the nasopharynx. The tip is at the stomach by prior radiograph IMPRESSION: 1. No acute finding. 2. Generalized atrophy. 3. The orogastric tube loops through the nasopharynx but the tip reaches the stomach by prior radiograph Electronically Signed   By: Monte Fantasia M.D.   On: 07/01/2019 10:39   Portable Chest x-ray  Result Date: 07/01/2019 CLINICAL DATA:  ETT placement. EXAM: PORTABLE CHEST 1 VIEW COMPARISON:  07/01/2019 at 6:43 a.m. FINDINGS: Endotracheal tube present approximately 4 cm above the carina. Gastric tube courses through in off the field of the radiograph into the upper stomach, see dedicated abdominal film for further detail. Mediastinal contours are partially obscured. Lung bases excluded from chest radiograph, included on abdominal radiograph. Persistent opacity and effusion noted at the RIGHT lung base. Increased interstitial markings throughout the chest with graded opacity also in the  LEFT chest. LEFT hemidiaphragm remains visible. RIGHT-sided PICC line remains in place. Multiple leads partially obscure the tip of the PICC line. Exact position difficult to determine but grossly unchanged compared to the recent chest x-ray likely near the caval to atrial junction. IMPRESSION: 1. Interval placement of endotracheal tube approximately 4 cm above the carina. 2. No interval change in the appearance of the lungs compared to earlier today. Lung bases excluded from chest radiograph, included on abdominal radiograph. Findings remain concerning for bilateral airspace disease with concomitant effusions RIGHT greater than LEFT. 3. PICC line remains in place, tip not well assessed but grossly similar to the prior exam where was seen at the caval to atrial junction. Electronically Signed   By: Zetta Bills M.D.   On:  07/01/2019 08:43   DG CHEST PORT 1 VIEW  Result Date: 07/01/2019 CLINICAL DATA:  Increased shortness of breath EXAM: PORTABLE CHEST 1 VIEW COMPARISON:  Yesterday FINDINGS: Hazy opacity on both sides that is progressed. Prominent heart size accentuated by scoliotic curvature. No visible pneumothorax. Right PICC with tip at the SVC. IMPRESSION: Increased hazy opacity on both sides that is likely a combination of pleural fluid and atelectasis/pneumonia. Electronically Signed   By: Monte Fantasia M.D.   On: 07/01/2019 07:13   DG CHEST PORT 1 VIEW  Result Date: 06/30/2019 CLINICAL DATA:  Respiratory failure. EXAM: PORTABLE CHEST 1 VIEW COMPARISON:  Radiograph yesterday.  Chest CT 06/25/2019 FINDINGS: Stable prominent heart size, distorted by scoliosis. Increasing hazy opacity at the right greater than left lung base. Patchy bibasilar opacities. No pneumothorax. Upper lungs are clear. Severe scoliotic curvature of the spine. IMPRESSION: Increasing hazy and patchy opacity at the right greater than left lung base, increasing atelectasis versus developing pleural effusions. Electronically Signed    By: Keith Rake M.D.   On: 06/30/2019 19:23   [place] EKG SITE RITE  Result Date: 06/30/2019 If Site Rite image not attached, placement could not be confirmed due to current cardiac rhythm.    Assessment/Plan:  Respiratory distress with vent dependence = suspect he has pneumonia, will expand coverage as well as cover enterococcus. I don't think this is eosinophilic pneumonia since it would be quick development related to just 3 doses of daptomycin but will still continue to monitor. Will change him to linezolid plus cefepime (which would cover mrsa, less renal injury)  cxr appears still some fluid overload, agree with doing diuresis  Enterococcal bacteremia and uti = will be covered by linezolid.  [facility] for Infectious Diseases Cell: [phone] Pager: [phone]  07/01/2019, 4:59 PM

## 2019-07-01 NOTE — Procedures (Addendum)
PCCM Video Bronchoscopy Procedure Note  The patient was informed of the risks (including but not limited to bleeding, infection, respiratory failure, lung injury, tooth/oral injury) and benefits of the procedure and gave consent, see chart.  Indication: bilateral airspace disease  Post Procedure Diagnosis: severe bronchomalacia right lung, particularly right lower lobe  Location: Acute respiratory failure with hypoxemia  Condition pre procedure: Critically ill, on vent  Medications for procedure: etomidate 6m, propofol infusion  Procedure description: The bronchoscope was introduced through the endotracheal tube and passed to the bilateral lungs to the level of the subsegmental bronchi throughout the tracheobronchial tree.  Airway exam revealed normal trachea, sharp carina.  The left tracheobronchial tree was essentially normal with the exception of mild bronchomalacia in the left lower lobe.  The right mainstem bronchus and right upper lobe were normal, however there was severe narrowing of the right lower lobe and right middle lobe orifices.        Procedures performed: BAL right middle lobe medial segment, 100 cc sterile saline instilled, 30cc clear return  Specimens sent: BAL  Condition post procedure: critically ill, on ventilator  EBL: none from procedure  Complications: none immediate  BRoselie Awkward MD [facility] Pager: [phone]: [phone] no response, call [phone]

## 2019-07-01 NOTE — Progress Notes (Addendum)
md paged through [facility] about concern for pt respiratory rate. Pt has been in yellow mews for most of last 24 hours. Rn awaiting call back.  Md came to floor after receiving page about pt and looked at pt chart. No new orders were obtained at this time.

## 2019-07-01 NOTE — Anesthesia Procedure Notes (Signed)
Procedure Name: Intubation Date/Time: 07/01/2019 7:21 AM Performed by: Victoriano Lain, CRNA Pre-anesthesia Checklist: Patient identified, Emergency Drugs available, Suction available, Timeout performed and Patient being monitored Patient Re-evaluated:Patient Re-evaluated prior to induction Oxygen Delivery Method: Ambu bag Preoxygenation: Pre-oxygenation with 100% oxygen Induction Type: IV induction and Cricoid Pressure applied Ventilation: Oral airway inserted - appropriate to patient size, Two handed mask ventilation required and Mask ventilation with difficulty Laryngoscope Size: Glidescope and 3 Grade View: Grade I Tube type: Glide rite Tube size: 7.5 mm Number of attempts: 2 Airway Equipment and Method: Video-laryngoscopy and Stylet Placement Confirmation: ETT inserted through vocal cords under direct vision,  positive ETCO2 and breath sounds checked- equal and bilateral Secured at: 22 cm Tube secured with: Tape Dental Injury: Teeth and Oropharynx as per pre-operative assessment  Comments: Please see Quick note.

## 2019-07-01 NOTE — Progress Notes (Signed)
md made aware of pt short run of svt. Pt did enter into red mews but quickly returned to yellow. Pt stable overall with no changes in pt overall condition. Md made aware of pt respiratory rate and overall condition.

## 2019-07-01 NOTE — Progress Notes (Signed)
Mid level provider paged for rn concern that pt has been in yellow mews due to increased respiratory rate of 24-32. Pt appears to be calm and non labored but fast with his breathing. 02 sat on 2.5 liters remains 91-94 percent. Pt lung sounds remain unchanged. Rn will continue to monitor.

## 2019-07-01 NOTE — Progress Notes (Addendum)
CRITICAL VALUE STICKER  CRITICAL VALUE: ph 7.002, CO2 of greater than 120  RECEIVER (on-site recipient of call): Passenger transport manager  DATE & TIME NOTIFIED: 07-01-2019 at [facility] (representative from lab):  MD NOTIFIED: [place]: 0645-was out in hall  RESPONSE: pt transferred to icu at 0705 for intubation

## 2019-07-01 NOTE — Progress Notes (Signed)
When rn rounded on pt at 0609 to perform vs. Pt was unresponsive to sternal rub. Pt would only open his  eyes to the side and moan. Pt also had a visual drool coming out of mouth. Pt respiratory rate had been increased but pt had been talking and responding appropriately up to this point. He would open his eyes and talk to staff. His lung sounds were also present but diminished. At time of event at 0609 pt had a rub sound in chest with 02 sat of 67 percent. Charge nurse [place] notified and Rapid response was called to room.Md was also called and came to pt room.  100 percent non rebreather applied to patient and 02 sat rose to 97 percent. Abg and chest xray obtained. Critical abg results called to RN , which were given to md that was in hall. Pt was then transferred to ICU for intubation. Daytime md Dr. Bonner Puna med nursing staff in ICU and notified family. Pt had been refusing to use cpap for sleep last night and would also take off his 02 as well, especially to eat or drink. Rn did explain the importance of his cpap and 02 to help his breathing. Pt still refused to wear cpap. Report given to [facility] in ICU at bedside without complication.

## 2019-07-01 NOTE — Progress Notes (Signed)
Called at 7:00am by overnight MD to respond to the patient found unresponsive this morning after reportedly declining CPAP. Still with spontaneous respirations but ABG confirms respiratory acidosis, moved to ICU and intubated. Called and spoke with the patient's wife and discussed case with PCCM who will assume care for the patient.   Suspect abrupt respiratory decompensation with decreased baseline pulmonary function due in part to not having CPAP overnight. Initial insult may have been pulmonary edema, though he diuresed well, had no peripheral edema, and had essentially normal LV function on recent echocardiograms. Decrudescence of fever argues against HCAP. AEP from daptomycin also a possibility and suggested by timing of doses and respiratory symptoms, no Eo's on diff. Note AM labs from 3am showed stable renal function. Labs from shortly after intubation are notable for the hyperkalemic AKI.  Vance Gather, MD 07/01/2019 12:02 PM

## 2019-07-01 NOTE — Consult Note (Addendum)
NAME:  Louis [name], MRN:  [id number], DOB:  January 22, 1955, LOS: 6 ADMISSION DATE:  06/25/2019, CONSULTATION DATE: July 01, 2019 REFERRING MD: Dr. Bonner Puna, CHIEF COMPLAINT: Respiratory arrest  Brief History   64 year old male admitted on June 25, 2019 in the setting of fever, tachypnea and some confusion.  Found to have enterococcal bacteremia of a urinary source.  Has been followed by infectious diseases, had a TEE which was unrevealing.  Was treated here with appropriate antibiotic therapy and his condition was improving and was to be discharged to inpatient rehab on June 30.  However, developed a respiratory arrest in the setting of unresponsiveness early in the morning on July 01, 2019.  History of present illness   The patient was obtunded and intubated on my arrival, I could not obtain history, see details listed above.  The rest of the history is obtained by chart review.  During the hospitalization while being treated for enterococcal bacteremia with ampicillin he was noted to have hyponatremia, hyperkalemia, some acute kidney injury and some volume overload.  On 6/29 fluids were held and he was given lasix.  Overnight from 6/29 through 6/30 he had some confusion, tachypnea.  By the morning of 6/30 he was unresponsive and moved to the ICU for intubation.    Past Medical History  BPH COPD > moderate airflow obstruction 2019, no restriction but has air trapping Thoracic Scoliosis but no restriction Diastolic heart failure Diverticulosis Elevated PSA Alcohol abuse Essential tremor Fatty liver Hypertension GERD Obesity Osteopenia Renal artery stenosis Obstructive sleep apnea on CPAP  Significant Hospital Events   6/24 admission  6/30 move to ICU, intubation  Consults:  ID PCCM  Procedures:  6/29 single lumen PICC >  6/30 ETT >   Significant Diagnostic Tests:  June 26, 2019 TTE: LVEF 55 to 94%, RV systolic function mildly reduced, aortic dilation, valves normal as  visualized June 28 TEE: LVEF normal, RV systolic function normal, mitral and aortic valves normal  Micro Data:  6/24 SARS COV 2 > negative 6/24 blood > enterococcus faecalis amp sensitive 6/24 urine culture  6/26 blood > negative  Antimicrobials:  6/24 ceftriaxone >  6/25 ampicillin >  6/27 daptomycin >   Interim history/subjective:  See above   Objective   Blood pressure 119/70, pulse 70, temperature (!) 97.4 F (36.3 C), temperature source Oral, resp. rate (!) 35, height 5\' 11"  (1.803 m), weight 95.2 kg, SpO2 97 %.        Intake/Output Summary (Last 24 hours) at 07/01/2019 0734 Last data filed at 07/01/2019 0535 Gross per 24 hour  Intake 1860.12 ml  Output 2100 ml  Net -239.88 ml   Filed Weights   06/29/19 0500 06/30/19 0445 07/01/19 0421  Weight: 100 kg 95.8 kg 95.2 kg    Examination: General:  In bed on vent HENT: NCAT ETT in place PULM: CTA B, vent supported breathing CV: RRR, no mgr GI: BS+, soft, nontender MSK: normal bulk and tone Neuro: sedated on vent  6/30 CXR images personally reviewed showing a bilateral airspace disease with pleural effusions  Resolved Hospital Problem list     Assessment & Plan:  Acute respiratory failure with hypercarbia and hypoxemia in setting of worsening bilateral infiltrates and pleural effusion: Clinical picture most consistent with acute pulmonary edema though his weight is down after diuresis and physical exam is not entirely consistent with volume overload.  Differential diagnosis includes acute eosinophilic pneumonia secondary to daptomycin versus healthcare associated pneumonia.  Based on his  significant underlying lung pathology (scoliosis with COPD and some significant bronchomalacia and bronchiectasis in RLL) suspect it would not take much of an acute pulmonary insult to make him decompensate. COPD Scoliosis OSA > refused CPAP 6/29  Intubated Admit to ICU Full mechanical ventilatory support Bronchoscopy to send for  cell count and culture Ventilator associated pneumonia prevention protocol CPAP post extubation  Chronic HPpEF Hypertension Hold lasix for now Continue metoprolol, norvasc  Essential tremor monitor  Hepatic steatosis with alcohol overuse > LFT prn > f/u repeat ceruloplasmin  B12 deficiency? and chronic macrocitis anemia >  thiamine, folate > check methylmelonic acid  Acute toxic and metabolic encephalopathy due to hypercarbia: Rapidly improving, suspect most likely etiology is hypercarbia.  Need for sedation for mechanical ventilation Check CT head  Consider EEG PAD protocol: propofol, intermittent versed and fentanyl RASS target 0, -1  Enterococcal bacteremia, urinary source Continue daptomycin for now  Hyperkalemia: mild Repeat BMET Monitor BMET and UOP Replace electrolytes as needed  Acute on chronic kidney disease: initially improved, slightly worse with diuresis Hold lasix Check U/A Check urine Cr and Na  Best practice:  Diet: hold tube feeding for now, assess if can extubate soon Pain/Anxiety/Delirium protocol (if indicated): as above VAP protocol (if indicated): yes DVT prophylaxis: sub cutaneous heparin GI prophylaxis: famotidine Glucose control: SSi Mobility: bed rest Code Status: full Family Communication: updated his wife [name] bedside Disposition: remain in ICU  Labs   CBC: Recent Labs  Lab 06/25/19 1144 06/26/19 0620 06/27/19 1122 06/28/19 0517 06/29/19 0509 06/30/19 0440 07/01/19 0316  WBC 6.1   < > 6.7 5.2 6.5 6.7 6.6  NEUTROABS 5.5  --   --  3.6  --   --   --   HGB 10.4*   < > 9.3* 8.7* 8.6* 8.6* 9.4*  HCT 30.0*   < > 27.7* 26.5* 26.5* 27.2* 30.6*  MCV 99.7   < > 103.7* 103.9* 105.2* 108.4* 110.9*  PLT 133*   < > 118* 118* 144* 173 304   < > = values in this interval not displayed.    Basic Metabolic Panel: Recent Labs  Lab 06/27/19 1122 06/28/19 0517 06/29/19 0509 06/30/19 0440 07/01/19 0316  NA 131* 133* 134* 136 141  K  4.3 4.0 3.7 3.9 4.0  CL 93* 99 100 100 101  CO2 22 24 27 28  35*  GLUCOSE 109* 95 107* 113* 133*  BUN 29* 23 19 17 22   CREATININE 1.57* 1.36* 1.30* 1.02 1.04  CALCIUM 8.2* 8.0* 8.0* 8.0* 8.7*   GFR: Estimated Creatinine Clearance: 86.8 mL/min (by C-G formula based on SCr of 1.04 mg/dL). Recent Labs  Lab 06/25/19 1308 06/26/19 0620 06/28/19 0517 06/29/19 0509 06/30/19 0440 07/01/19 0316  WBC  --    < > 5.2 6.5 6.7 6.6  LATICACIDVEN 1.8  --   --   --   --   --    < > = values in this interval not displayed.    Liver Function Tests: Recent Labs  Lab 06/27/19 1122 06/28/19 0517 06/29/19 0509 06/30/19 0440 07/01/19 0316  AST 125* 76* 68* 40 36  ALT 97* 70* 62* 51* 48*  ALKPHOS 50 44 53 61 74  BILITOT 0.7 0.8 0.8 0.6 0.5  PROT 7.0 6.2* 6.1* 6.1* 6.2*  ALBUMIN 3.2* 2.8* 2.6* 2.4* 2.7*   No results for input(s): LIPASE, AMYLASE in the last 168 hours. Recent Labs  Lab 06/25/19 2122  AMMONIA 25    ABG  Component Value Date/Time   PHART 7.002 (LL) 07/01/2019 0632   PCO2ART >120 (HH) 07/01/2019 0632   PO2ART 112 (H) 07/01/2019 5732   HCO3 33.5 (H) 07/01/2019 0632   ACIDBASEDEF 1.3 07/01/2019 0632   O2SAT 96.3 07/01/2019 0632     Coagulation Profile: No results for input(s): INR, PROTIME in the last 168 hours.  Cardiac Enzymes: Recent Labs  Lab 06/25/19 1335  CKTOTAL 344    HbA1C: Hgb A1c MFr Bld  Date/Time Value Ref Range Status  06/26/2019 06:20 AM 5.7 (H) 4.8 - 5.6 % Final    Comment:    (NOTE) Pre diabetes:          5.7%-6.4%  Diabetes:              >6.4%  Glycemic control for   <7.0% adults with diabetes     CBG: Recent Labs  Lab 06/25/19 1226 07/01/19 0626  GLUCAP 140* 155*    Review of Systems:   Cannot obtain due to intubation  Past Medical History  He,  has a past medical history of Anxiety, BPH (benign prostatic hyperplasia), Bundle branch block, CHF (congestive heart failure) ([place]), COPD (chronic obstructive pulmonary  disease) ([place]), Diverticulosis, Eczema, Elevated PSA (2015), Essential tremor, GERD (gastroesophageal reflux disease), History of panic attacks, HTN (hypertension), Mild intermittent asthma in adult without complication, Obesity, Class I, BMI 30-34.9, Osteoporosis, Renal artery stenosis in 1 of 2 vessels ([place]) (2011), Seasonal allergies, Sleep apnea, Thoracic scoliosis (childhood), and Transaminitis.   Surgical History    Past Surgical History:  Procedure Laterality Date  . APPLICATION OF WOUND VAC Left 05/09/2018   Procedure: Application Of Wound Vac;  Surgeon: Newt Minion, MD;  Location: [place];  Service: Orthopedics;  Laterality: Left;  . BIOPSY  04/30/2019   Procedure: BIOPSY;  Surgeon: Juanita Craver, MD;  Location: [facility];  Service: Endoscopy;;  . COLONOSCOPY  12/2013   polyps, int hem, diverticulosis, rpt 5 yrs ([place])  . COLONOSCOPY WITH PROPOFOL N/A 04/30/2019   Procedure: COLONOSCOPY WITH PROPOFOL;  Surgeon: Juanita Craver, MD;  Location: [facility];  Service: Endoscopy;  Laterality: N/A;  . ELBOW SURGERY Right 2008   golfer's elbow  . ESOPHAGOGASTRODUODENOSCOPY (EGD) WITH PROPOFOL N/A 04/30/2019   Procedure: ESOPHAGOGASTRODUODENOSCOPY (EGD) WITH PROPOFOL;  Surgeon: Juanita Craver, MD;  Location: [facility];  Service: Endoscopy;  Laterality: N/A;  . INGUINAL HERNIA REPAIR Bilateral childhood & ~1995  . LAPAROTOMY N/A 01/05/2019   Procedure: EXPLORATORY LAPAROTOMY graham patch repair;  Surgeon: Benjamine Sprague, DO;  Location: [facility];  Service: General;  Laterality: N/A;  . NASAL SEPTUM SURGERY  2013   deviated septum  . ORIF ANKLE FRACTURE Left 05/09/2018   Procedure: OPEN REDUCTION INTERNAL FIXATION LEFT LISFRANC FRACTURE/DISLOCATION;  Surgeon: Newt Minion, MD;  Location: [place];  Service: Orthopedics;  Laterality: Left;  . POLYPECTOMY  04/30/2019   Procedure: POLYPECTOMY;  Surgeon: Juanita Craver, MD;  Location[facility];  Service: Endoscopy;;  . US ECHOCARDIOGRAPHY  02/2009    WNL, EF >55% Claiborne Billings)  . US RENAL/AORTA Right 05/2009   1-59% diameter reduction renal artery, rec rpt 2 yrs     Social History   reports that he has never smoked. He has never used smokeless tobacco. He reports current alcohol use of about 14.0 standard drinks of alcohol per week. He reports that he does not use drugs.   Family History   His family history includes Alcohol abuse in his mother; Ataxia in his brother and sister; Cancer  in his maternal grandmother; Cancer (age of onset: 73) in his father; Diabetes in his mother; Prostate cancer in his father; Stroke in his mother. There is no history of CAD.   Allergies Allergies  Allergen Reactions  . Accupril [Quinapril Hcl] Cough  . Nsaids     Perforated gastric ulcer     Home Medications  Prior to Admission medications   Medication Sig Start Date End Date Taking? Authorizing Provider  ALPRAZolam Duanne Moron) 0.5 MG tablet Take 1 tablet (0.5 mg total) by mouth daily as needed (essential tremor). 11/18/18  Yes Rutherford Guys, MD  amLODipine (NORVASC) 5 MG tablet TAKE 1 TABLET(5 MG) BY MOUTH DAILY Patient taking differently: Take 5 mg by mouth daily.  12/22/18  Yes Troy Sine, MD  gabapentin (NEURONTIN) 300 MG capsule Take 300 mg by mouth 2 (two) times daily.   Yes [provider]  losartan (COZAAR) 100 MG tablet Take 1 tablet (100 mg total) by mouth daily. NEED OV. 01/26/19  Yes Almyra Deforest, PA  metoprolol succinate (TOPROL-XL) 50 MG 24 hr tablet Take 3 tablets (150 mg total) by mouth daily. Take with or immediately following a meal. 01/26/19  Yes Meng, [place], [place]  spironolactone (ALDACTONE) 25 MG tablet TAKE 1 TABLET BY MOUTH EVERY DAY Patient taking differently: Take 25 mg by mouth daily.  10/14/18  Yes Troy Sine, MD  pantoprazole (PROTONIX) 40 MG tablet Take 1 tablet (40 mg total) by mouth daily. Patient not taking: Reported on 06/25/2019 01/09/19 01/09/20  Benjamine Sprague, DO     Critical care time: 45 minutes     Roselie Awkward, MD [place] PCCM Pager: [phone] Cell: [phone] If no response, call [phone]

## 2019-07-01 NOTE — Progress Notes (Signed)
Nutrition Follow-up  DOCUMENTATION CODES:   Not applicable  INTERVENTION:  - will order TF: Vital AF 1.2 @ 60 ml/hr with 30 ml prostat once/day. - this regimen + kcal from current propofol rate will provide 2279 kcal (98.5% estimated kcal need), 123 grams protein, and 1168 ml free water.   NUTRITION DIAGNOSIS:   Inadequate oral intake related to inability to eat as evidenced by NPO status. -ongoing  GOAL:   Patient will meet greater than or equal to 90% of their needs -unmet at this time  MONITOR:   Vent status, TF tolerance, Labs, Weight trends  REASON FOR ASSESSMENT:   Ventilator, Consult Enteral/tube feeding initiation and management  ASSESSMENT:   Pt presented with increasing falls and progressive weakness. Pt with Enterococcal bacteremia and ARF. PMH includes scoliosis with restrictive and obstructive lung disease, chronic HFpEF, OSA and nocturnal hypoxia on CPAP, essential tremor, hepatic steatosis, and perforated duodenal ulcer s/p Louis [name] patch (Jan 2021).  Full RD assessment completed yesterday afternoon. Patient with low respiratory rate during night shift. Rapid Response was called and patient was minimally responsive at that time. He was transferred to ICU and emergently intubated. Patient with OGT in place (tip in proximal stomach).   Able to talk with RN. Estimated nutrition needs updated and based on weight on 6/26, admission (94.6 kg); weight today is very consistent with this.   He was previously on a Regular diet and most recently consumed 50% of breakfast and 50% of dinner yesterday.   Patient is currently intubated on ventilator support MV: 9.5 L/min Temp (24hrs), Avg:98.5 F (36.9 C), Min:96.6 F (35.9 C), Max:102.4 F (39.1 C) Propofol: 17.1 ml/hr (451 kcal) BP: 104/46 and MAP: 63    Labs reviewed; CBGs: 155 and 135 mg/dl, K: 5.3 mmol/l, creatinine: 1.35 mg/dl, Ca: 8.7 mg/dl, GFR: 56 ml/min. Medications reviewed; 100 mg colace BID, 20 mg IV pepcid  BID, 1 mg folvite/day, 40 mg IV lasix x1 dose 6/30, 1 tablet multivitamin with minerals/day, 17 g miralax/day, 500 mg IV TID 6/28-6/30. 100 mg oral thiamine/day. Drip; propofol @ 30 mcg/kg/min.    Diet Order:   Diet Order            Diet NPO time specified  Diet effective now                 EDUCATION NEEDS:   No education needs have been identified at this time  Skin:  Skin Assessment: Reviewed RN Assessment  Last BM:  6/29  Height:   Ht Readings from Last 1 Encounters:  07/01/19 5\' 11"  (1.803 m)    Weight:   Wt Readings from Last 1 Encounters:  07/01/19 95.2 kg    Estimated Nutritional Needs:  Kcal:  2313 kcal Protein:  123-142 grams Fluid:  >/= 2.2 L/day     Louis Matin, MS, RD, LDN, CNSC Inpatient Clinical Dietitian RD pager # available in [facility]  After hours/weekend pager # available in [facility]

## 2019-07-01 NOTE — Progress Notes (Signed)
[facility] PCCM  BAL: neutrophil predominant inflammatory infiltrate, no eosinophils Discussed with ID, not consistent with daptomycin lung toxicity Would broaden antibiotic coverage to include HCAP Continue lasix  Roselie Awkward, MD [place] PCCM Pager: [phone] Cell: [phone] If no response, call [phone]

## 2019-07-01 NOTE — TOC Progression Note (Signed)
Transition of Care [facility] - Progression Note    Patient Details  Name: Louis [name] MRN: [id number] Date of Birth: 1955-01-29  [facility]) CM/SW Contact  Leeroy Cha, RN Phone Number: 07/01/2019, 11:07 AM  Clinical Narrative:    Transferred to icu early this am, intubated, spoke with wife, has been declining over past several weeks was planning to go to CIR today.  Patient sedated at this time.   Expected Discharge Plan: IP Rehab Facility Barriers to Discharge: Continued Medical Work up  Expected Discharge Plan and Services Expected Discharge Plan: [place]   Discharge Planning Services: CM Consult Post Acute Care Choice: IP Rehab Living arrangements for the past 2 months: Single Family Home                                       Social Determinants of Health (SDOH) Interventions    Readmission Risk Interventions No flowsheet data found.

## 2019-07-01 NOTE — Significant Event (Addendum)
Rapid Response Event Note  Overview: Called d/t pt found unresponsive    Initial Focused Assessment:  Found pt to be minimally responsive.   Interventions: CBG, CXR and ABG collected.  Pt VS intact other than pt placed on NRB for decreased sats. MD at bedside awaiting results. See chart for orders per MD and VS.  Plan of Care (if not transferred): Pt to be transferred to ICU and intubated.  Pt wife made aware.   Dyann Ruddle

## 2019-07-02 LAB — GLUCOSE, CAPILLARY
Glucose-Capillary: 104 mg/dL — ABNORMAL HIGH (ref 70–99)
Glucose-Capillary: 124 mg/dL — ABNORMAL HIGH (ref 70–99)
Glucose-Capillary: 137 mg/dL — ABNORMAL HIGH (ref 70–99)
Glucose-Capillary: 144 mg/dL — ABNORMAL HIGH (ref 70–99)
Glucose-Capillary: 86 mg/dL (ref 70–99)

## 2019-07-02 LAB — CBC WITH DIFFERENTIAL/PLATELET
Abs Immature Granulocytes: 0.1 10*3/uL — ABNORMAL HIGH (ref 0.00–0.07)
Basophils Absolute: 0 10*3/uL (ref 0.0–0.1)
Basophils Relative: 0 %
Eosinophils Absolute: 0.2 10*3/uL (ref 0.0–0.5)
Eosinophils Relative: 3 %
HCT: 24.9 % — ABNORMAL LOW (ref 39.0–52.0)
Hemoglobin: 8 g/dL — ABNORMAL LOW (ref 13.0–17.0)
Immature Granulocytes: 2 %
Lymphocytes Relative: 18 %
Lymphs Abs: 0.9 10*3/uL (ref 0.7–4.0)
MCH: 33.8 pg (ref 26.0–34.0)
MCHC: 32.1 g/dL (ref 30.0–36.0)
MCV: 105.1 fL — ABNORMAL HIGH (ref 80.0–100.0)
Monocytes Absolute: 0.8 10*3/uL (ref 0.1–1.0)
Monocytes Relative: 15 %
Neutro Abs: 3.1 10*3/uL (ref 1.7–7.7)
Neutrophils Relative %: 62 %
Platelets: 372 10*3/uL (ref 150–400)
RBC: 2.37 MIL/uL — ABNORMAL LOW (ref 4.22–5.81)
RDW: 13.6 % (ref 11.5–15.5)
WBC: 5.1 10*3/uL (ref 4.0–10.5)
nRBC: 0 % (ref 0.0–0.2)

## 2019-07-02 LAB — PHOSPHORUS
Phosphorus: <1 mg/dL — CL (ref 2.5–4.6)
Phosphorus: 1.2 mg/dL — ABNORMAL LOW (ref 2.5–4.6)

## 2019-07-02 LAB — CULTURE, BLOOD (ROUTINE X 2)
Culture: NO GROWTH
Culture: NO GROWTH
Special Requests: ADEQUATE
Special Requests: ADEQUATE

## 2019-07-02 LAB — COMPREHENSIVE METABOLIC PANEL
ALT: 36 U/L (ref 0–44)
AST: 31 U/L (ref 15–41)
Albumin: 2.4 g/dL — ABNORMAL LOW (ref 3.5–5.0)
Alkaline Phosphatase: 64 U/L (ref 38–126)
Anion gap: 11 (ref 5–15)
BUN: 24 mg/dL — ABNORMAL HIGH (ref 8–23)
CO2: 29 mmol/L (ref 22–32)
Calcium: 8.6 mg/dL — ABNORMAL LOW (ref 8.9–10.3)
Chloride: 103 mmol/L (ref 98–111)
Creatinine, Ser: 0.96 mg/dL (ref 0.61–1.24)
GFR calc Af Amer: >60 mL/min (ref >60)
GFR calc non Af Amer: >60 mL/min (ref >60)
Glucose, Bld: 202 mg/dL — ABNORMAL HIGH (ref 70–99)
Potassium: 3 mmol/L — ABNORMAL LOW (ref 3.5–5.1)
Sodium: 143 mmol/L (ref 135–145)
Total Bilirubin: 0.6 mg/dL (ref 0.3–1.2)
Total Protein: 5.9 g/dL — ABNORMAL LOW (ref 6.5–8.1)

## 2019-07-02 LAB — MRSA PCR SCREENING: MRSA by PCR: NEGATIVE

## 2019-07-02 LAB — TRIGLYCERIDES: Triglycerides: 135 mg/dL (ref <150)

## 2019-07-02 LAB — TSH: TSH: 0.937 u[IU]/mL (ref 0.350–4.500)

## 2019-07-02 LAB — PATHOLOGIST SMEAR REVIEW

## 2019-07-02 LAB — MAGNESIUM
Magnesium: 1.5 mg/dL — ABNORMAL LOW (ref 1.7–2.4)
Magnesium: 1.9 mg/dL (ref 1.7–2.4)

## 2019-07-02 MED ORDER — IPRATROPIUM-ALBUTEROL 0.5-2.5 (3) MG/3ML IN SOLN
3.0000 mL | Freq: Four times a day (QID) | RESPIRATORY_TRACT | Status: DC | PRN
  Administered 2019-07-02: 3 mL via RESPIRATORY_TRACT
  Filled 2019-07-02: qty 3

## 2019-07-02 MED ORDER — DOCUSATE SODIUM 100 MG PO CAPS
100.0000 mg | ORAL_CAPSULE | Freq: Two times a day (BID) | ORAL | Status: DC
  Administered 2019-07-02 – 2019-07-04 (×4): 100 mg via ORAL
  Filled 2019-07-02 (×8): qty 1

## 2019-07-02 MED ORDER — MAGNESIUM SULFATE 2 GM/50ML IV SOLN
2.0000 g | Freq: Once | INTRAVENOUS | Status: AC
  Administered 2019-07-02: 2 g via INTRAVENOUS
  Filled 2019-07-02: qty 50

## 2019-07-02 MED ORDER — POTASSIUM PHOSPHATES 15 MMOLE/5ML IV SOLN
30.0000 mmol | Freq: Once | INTRAVENOUS | Status: AC
  Administered 2019-07-02: 30 mmol via INTRAVENOUS
  Filled 2019-07-02: qty 10

## 2019-07-02 MED ORDER — SODIUM PHOSPHATES 45 MMOLE/15ML IV SOLN
30.0000 mmol | Freq: Once | INTRAVENOUS | Status: AC
  Administered 2019-07-02: 30 mmol via INTRAVENOUS
  Filled 2019-07-02: qty 10

## 2019-07-02 MED ORDER — AMLODIPINE BESYLATE 5 MG PO TABS
5.0000 mg | ORAL_TABLET | Freq: Every day | ORAL | Status: DC
  Administered 2019-07-03 – 2019-07-07 (×5): 5 mg via ORAL
  Filled 2019-07-02 (×5): qty 1

## 2019-07-02 MED ORDER — AMLODIPINE BESYLATE 5 MG PO TABS
5.0000 mg | ORAL_TABLET | Freq: Every day | ORAL | Status: DC
  Administered 2019-07-02: 5 mg

## 2019-07-02 MED ORDER — POLYETHYLENE GLYCOL 3350 17 G PO PACK
17.0000 g | PACK | Freq: Every day | ORAL | Status: DC
  Administered 2019-07-04: 17 g via ORAL
  Filled 2019-07-02: qty 1

## 2019-07-02 MED ORDER — METOPROLOL TARTRATE 25 MG/10 ML ORAL SUSPENSION
75.0000 mg | Freq: Two times a day (BID) | ORAL | Status: AC
  Administered 2019-07-02 (×2): 75 mg
  Filled 2019-07-02 (×2): qty 30

## 2019-07-02 MED ORDER — FAMOTIDINE 20 MG PO TABS
20.0000 mg | ORAL_TABLET | Freq: Two times a day (BID) | ORAL | Status: DC
  Administered 2019-07-02 – 2019-07-07 (×10): 20 mg via ORAL
  Filled 2019-07-02 (×10): qty 1

## 2019-07-02 MED ORDER — ORAL CARE MOUTH RINSE
15.0000 mL | Freq: Two times a day (BID) | OROMUCOSAL | Status: DC
  Administered 2019-07-02 – 2019-07-07 (×8): 15 mL via OROMUCOSAL

## 2019-07-02 MED ORDER — PHENOL 1.4 % MT LIQD
1.0000 | OROMUCOSAL | Status: DC | PRN
  Filled 2019-07-02: qty 177

## 2019-07-02 MED ORDER — METOPROLOL SUCCINATE ER 25 MG PO TB24
150.0000 mg | ORAL_TABLET | Freq: Every day | ORAL | Status: DC
  Filled 2019-07-02: qty 6

## 2019-07-02 NOTE — Progress Notes (Signed)
Physical Therapy Treatment Patient Details Name: Louis [name] MRN: [id number] DOB: September 04, 1955 Today's Date: 07/02/2019    History of Present Illness Louis [name] is a 64 y.o. male with a history of scoliosis with restrictive and obstructive lung disease, chronic HFpEF, OSA and nocturnal hypoxia on CPAP and 2L O2, essential tremor, hepatic steatosis, and perforated duodenal ulcer s/p [name] Jan 2021 who presented to the ED with increasing falls and progressive weakness. Patient intubated 6/30 secondary to respiratory distress and extubated 7/1. Patient currenty on [place] [facility] Events   6/24 admission   6/30 move to ICU, intubation  6/30 bronchoscopy   PT Comments    Patient extubated this AM following transfer to ICU yesterday due to respiratory arrest. Pt on 2L/min via [place] today and saturating at 100% at start of session. Patient placed on RA for majority of session with SpO2 remaining in 90's and slight drop to 80's with gait. Patient completed partial stands/tricep press ups prior to full sit<>stands. He took several small steps for forward gait and had difficulty sequencing/planning foot placement. Following short steps patient fatigued and required seated rest break. Placed back on 2L.min at EOS and saturating at 98%. RN notified and plans to continue to wean.     Follow Up Recommendations  CIR     Equipment Recommendations  Rolling walker with 5" wheels    Recommendations for Other Services       Precautions / Restrictions Precautions Precautions: Fall Precaution Comments: watch O2 sats Restrictions Weight Bearing Restrictions: No Other Position/Activity Restrictions: per chart: WBAT in shoes with carbon plate inserts per wife    Mobility  Bed Mobility      General bed mobility comments: Patient seated in recliner when therapist entered the room. Patient performed partial sit to stand x 5 with use of BUEs to prepapre for standing activity and Vs  monitored. BP improved, HR WNL and o2 sat in high 90s with activity.  Transfers Overall transfer level: Needs assistance Equipment used: Rolling walker (2 wheeled) Transfers: Sit to/from Stand Sit to Stand: Min assist;+2 safety/equipment         General transfer comment: pt continues to be quick with power up and requires min assist to steady with rising. cues for safe hand placement required. 2x sit<>stand performed from recliner. BP monitored with transfer and stable, pt denied symptoms. Pt had good recall on hand placement on second stand.   Ambulation/Gait Ambulation/Gait assistance: +2 safety/equipment;Mod assist Gait Distance (Feet): 2 Feet Assistive device: Rolling walker (2 wheeled) Gait Pattern/deviations: Step-to pattern;Decreased stride length;Shuffle Gait velocity: decreased   General Gait Details: pt with difficulty sequencing steps in RW and managing walker position. Mod assist to steady and position walker. Pt fatigued easily and SOB with short gait. SpO2 appeared to drop to 81% and then 69% momentarily but believe this was slight artifact from pt's grip on RW as pulse ox was on fingertip. close chair follow provided for safety.    Stairs        Wheelchair Mobility    Modified Rankin (Stroke Patients Only)       Balance Overall balance assessment: Needs assistance Sitting-balance support: Feet supported Sitting balance-Leahy Scale: Fair     Standing balance support: During functional activity;Bilateral upper extremity supported Standing balance-Leahy Scale: Poor           Cognition Arousal/Alertness: Awake/alert Behavior During Therapy: WFL for tasks assessed/performed Overall Cognitive Status: Impaired/Different from baseline Area of Impairment: Memory;Problem solving;Safety/judgement  Current Attention Level: Selective Memory: Decreased short-term memory Following Commands: Follows one step commands consistently Safety/Judgement:  Decreased awareness of safety Awareness: Emergent Problem Solving: Slow processing General Comments: Continues to improve cognitively but still mild deficits noted - especially with short term memory.      Exercises      General Comments        Pertinent Vitals/Pain Pain Assessment: No/denies pain           PT Goals (current goals can now be found in the care plan section) Acute Rehab PT Goals Patient Stated Goal: I want to be able to walk again PT Goal Formulation: With patient Time For Goal Achievement: 07/10/19 Potential to Achieve Goals: Good Progress towards PT goals: Progressing toward goals    Frequency    Min 3X/week      PT Plan Current plan remains appropriate    Co-evaluation PT/OT/SLP Co-Evaluation/Treatment: Yes Reason for Co-Treatment: For patient/therapist safety;To address functional/ADL transfers PT goals addressed during session: Mobility/safety with mobility OT goals addressed during session: Strengthening/ROM (functional mobility)      AM-PAC PT "6 Clicks" Mobility   Outcome Measure  Help needed turning from your back to your side while in a flat bed without using bedrails?: A Lot Help needed moving from lying on your back to sitting on the side of a flat bed without using bedrails?: A Lot Help needed moving to and from a bed to a chair (including a wheelchair)?: A Lot Help needed standing up from a chair using your arms (e.g., wheelchair or bedside chair)?: A Little Help needed to walk in hospital room?: A Lot Help needed climbing 3-5 steps with a railing? : Total 6 Click Score: 12    End of Session Equipment Utilized During Treatment: Gait belt Activity Tolerance: Patient tolerated treatment well Patient left: in chair;with call bell/phone within reach;with family/visitor present Nurse Communication: Mobility status PT Visit Diagnosis: Other abnormalities of gait and mobility (R26.89)     Time: 0998-3382 PT Time Calculation (min)  (ACUTE ONLY): 33 min  Charges:  $Therapeutic Activity: 8-22 mins                     Verner Mould, DPT Acute Rehabilitation Services  Office [phone] Pager [phone]  07/02/2019 5:40 PM

## 2019-07-02 NOTE — TOC Progression Note (Addendum)
Transition of Care [facility]) - Progression Note    Patient Details  Name: Louis [name] MRN: [id number] Date of Birth: 1955/10/29  Transition of Care [facility]) CM/SW Contact  Leeroy Cha, RN Phone Number: 07/02/2019, 10:48 AM  Clinical Narrative:    Patint awake and reactive, Extubated this am.   Expected Discharge Plan: IP Rehab Facility Barriers to Discharge: Continued Medical Work up  Expected Discharge Plan and Services Expected Discharge Plan: [place]   Discharge Planning Services: CM Consult Post Acute Care Choice: IP Rehab Living arrangements for the past 2 months: Single Family Home                                       Social Determinants of Health (SDOH) Interventions    Readmission Risk Interventions No flowsheet data found.

## 2019-07-02 NOTE — Procedures (Signed)
Extubation Procedure Note  Patient Details:   Name: DIA JEFFERYS DOB: 30-Nov-1955 MRN: [id number]   Airway Documentation:    Vent end date: 07/02/19 Vent end time: 0910   Evaluation  O2 sats: stable throughout Complications: No apparent complications Patient did tolerate procedure well. Bilateral Breath Sounds: Diminished   Placed on 4l [place]  Tamera Reason 07/02/2019, 9:32 AM

## 2019-07-02 NOTE — Progress Notes (Signed)
[facility] Disease    Date of Admission:  06/25/2019   Total days of antibiotics 8          ID: Louis [name] is a 64 y.o. male with enterococcal uti/bacteremia with acute respiratory failure requiring intubation Active Problems:   HTN (hypertension)   Anxiety state   Essential tremor   Renal artery stenosis in 1 of 2 vessels (HCC)   Transaminitis   SOB (shortness of breath)   Chronic respiratory failure with hypoxia and hypercapnia (HCC)   OSA (obstructive sleep apnea)   Hyperkalemia   Hyponatremia   ARF (acute renal failure) (HCC)   Bacteremia due to Enterococcus    Subjective: He was extubated earlier today and doing well. Mild throat irritation, not having a productive cough. Very alert speaking with his wife at his bedside; has been afebrile the last 24hrs (only had isolated 102F at time of intubation)  Medications:  . amLODipine  5 mg Per Tube Daily  . Chlorhexidine Gluconate Cloth  6 each Topical Daily  . docusate  100 mg Per Tube BID  . feeding supplement (PRO-STAT SUGAR FREE 64)  30 mL Per Tube Daily  . folic acid  1 mg Oral Daily  . gabapentin  300 mg Oral BID  . heparin  5,000 Units Subcutaneous Q8H  . mouth rinse  15 mL Mouth Rinse BID  . metoprolol tartrate  75 mg Per Tube BID  . multivitamin with minerals  1 tablet Oral Daily  . polyethylene glycol  17 g Per Tube Daily  . thiamine  100 mg Oral Daily    Objective: Vital signs in last 24 hours: Temp:  [97.3 F (36.3 C)-99 F (37.2 C)] 97.3 F (36.3 C) (07/01 1616) Pulse Rate:  [52-81] 81 (07/01 1700) Resp:  [15-25] 23 (07/01 1700) BP: (130-154)/(58-111) 154/80 (07/01 1700) SpO2:  [92 %-100 %] 96 % (07/01 1700) FiO2 (%):  [40 %] 40 % (07/01 0856) Physical Exam  Constitutional: He is oriented to person, place, and time. He appears well-developed and well-nourished. No distress.  HENT:  Mouth/Throat: Oropharynx is clear and moist. No oropharyngeal exudate.  Cardiovascular: Normal  rate, regular rhythm and normal heart sounds. Exam reveals no gallop and no friction rub.  No murmur heard.  Pulmonary/Chest: Effort normal and breath sounds normal. No respiratory distress. He has no wheezes.  Abdominal: Soft. Bowel sounds are normal. He exhibits no distension. There is no tenderness.  Lymphadenopathy:  He has no cervical adenopathy.  Neurological: He is alert and oriented to person, place, and time. Mild tremor Skin: Skin is warm and dry. No rash noted. No erythema.  Psychiatric: He has a normal mood and affect. His behavior is normal.     Lab Results Recent Labs    07/01/19 0738 07/01/19 0738 07/01/19 1509 07/02/19 0901  WBC 5.2  --   --  5.1  HGB 9.9*  --   --  8.0*  HCT 32.2*  --   --  24.9*  NA 139   < > 142 143  K 5.3*   < > 3.4* 3.0*  CL 99   < > 102 103  CO2 29   < > 31 29  BUN 22   < > 27* 24*  CREATININE 1.35*   < > 1.24 0.96   < > = values in this interval not displayed.   Liver Panel Recent Labs    07/01/19 0738 07/02/19 0901  PROT 6.8 5.9*  ALBUMIN 2.8* 2.4*  AST 46* 31  ALT 51* 36  ALKPHOS 74 64  BILITOT 0.4 0.6    Microbiology: reviewed Studies/Results: DG Abd 1 View  Result Date: 07/01/2019 CLINICAL DATA:  Nasogastric tube placement EXAM: ABDOMEN - 1 VIEW COMPARISON:  January 08, 2019 FINDINGS: Nasogastric tube tip and side port are in the proximal stomach. No evident bowel dilatation or air-fluid levels. No free air. Atelectatic change noted in right lung base. Marked lower thoracic and lumbar scoliosis noted. IMPRESSION: Nasogastric tube tip and side port in proximal stomach. No bowel obstruction or free air appreciable. Marked scoliosis. Electronically Signed   By: Lowella Grip III M.D.   On: 07/01/2019 08:39   CT HEAD WO CONTRAST  Result Date: 07/01/2019 CLINICAL DATA:  Mental status change with unknown cause. Hypercarbia and hypoxemia EXAM: CT HEAD WITHOUT CONTRAST TECHNIQUE: Contiguous axial images were obtained from the  base of the skull through the vertex without intravenous contrast. COMPARISON:  06/25/2019 brain MRI FINDINGS: Brain: No evidence of acute infarction, hemorrhage, hydrocephalus, extra-axial collection or mass lesion/mass effect. Generalized cortical and cerebellar atrophy greater than expected for age. Vascular: No hyperdense vessel or unexpected calcification. Skull: Normal. Negative for fracture or focal lesion. Sinuses/Orbits: No acute finding. Other: [name] oropharyngeal catheter loops through the nasopharynx. The tip is at the stomach by prior radiograph IMPRESSION: 1. No acute finding. 2. Generalized atrophy. 3. The orogastric tube loops through the nasopharynx but the tip reaches the stomach by prior radiograph Electronically Signed   By: Monte Fantasia M.D.   On: 07/01/2019 10:39   Portable Chest x-ray  Result Date: 07/01/2019 CLINICAL DATA:  ETT placement. EXAM: PORTABLE CHEST 1 VIEW COMPARISON:  07/01/2019 at 6:43 a.m. FINDINGS: Endotracheal tube present approximately 4 cm above the carina. Gastric tube courses through in off the field of the radiograph into the upper stomach, see dedicated abdominal film for further detail. Mediastinal contours are partially obscured. Lung bases excluded from chest radiograph, included on abdominal radiograph. Persistent opacity and effusion noted at the RIGHT lung base. Increased interstitial markings throughout the chest with graded opacity also in the LEFT chest. LEFT hemidiaphragm remains visible. RIGHT-sided PICC line remains in place. Multiple leads partially obscure the tip of the PICC line. Exact position difficult to determine but grossly unchanged compared to the recent chest x-ray likely near the caval to atrial junction. IMPRESSION: 1. Interval placement of endotracheal tube approximately 4 cm above the carina. 2. No interval change in the appearance of the lungs compared to earlier today. Lung bases excluded from chest radiograph, included on  abdominal radiograph. Findings remain concerning for bilateral airspace disease with concomitant effusions RIGHT greater than LEFT. 3. PICC line remains in place, tip not well assessed but grossly similar to the prior exam where was seen at the caval to atrial junction. Electronically Signed   By: Zetta Bills M.D.   On: 07/01/2019 08:43   DG CHEST PORT 1 VIEW  Result Date: 07/01/2019 CLINICAL DATA:  Increased shortness of breath EXAM: PORTABLE CHEST 1 VIEW COMPARISON:  Yesterday FINDINGS: Hazy opacity on both sides that is progressed. Prominent heart size accentuated by scoliotic curvature. No visible pneumothorax. Right PICC with tip at the SVC. IMPRESSION: Increased hazy opacity on both sides that is likely a combination of pleural fluid and atelectasis/pneumonia. Electronically Signed   By: Monte Fantasia M.D.   On: 07/01/2019 07:13     Assessment/Plan: Acute respiratory failure, now recovered = likely all due to hypercapnea. He is  the clearest in his sensorium since I have come to meet him. I suspect he was retaining CO2 prior to intubation. Recommend to make sure he has CPAP tonight, since hx of obstructive lung disease/ osa.  hcap = await BAL cultures. We broadened his antibiotics. If cx are negative, would just keep on linezolid  Deconditioning = approved for [facility] for Infectious Diseases Cell: [phone] Pager: [phone]  07/02/2019, 5:58 PM

## 2019-07-02 NOTE — Progress Notes (Signed)
Occupational Therapy Treatment Patient Details Name: Louis [name] MRN: [id number] DOB: 1955/08/01 Today's Date: 07/02/2019    History of present illness Louis [name] is a 64 y.o. male with a history of scoliosis with restrictive and obstructive lung disease, chronic HFpEF, OSA and nocturnal hypoxia on CPAP and 2L O2, essential tremor, hepatic steatosis, and perforated duodenal ulcer s/p [name] Jan 2021 who presented to the ED with increasing falls and progressive weakness. Patient intubated 6/30 secondary to respiratory distress and extubated 7/1. Patient currenty on 2L [place]   OT comments  Patient s/p extubation this morning and on 2L [place]. However patient did well on RA with o2 sats maintaining in the 90s. Patient able to perform partial sit to stands x 5, standing x 2, and several steps with RW. Assistance x 2 needing for safety, lines and leads. Patient exhibited the most difficulty with motor planning steps and continues to demonstrate decreased activity tolerance. VS stable.   Follow Up Recommendations  CIR;Supervision/Assistance - 24 hour    Equipment Recommendations  Other (comment)    Recommendations for Other Services      Precautions / Restrictions Precautions Precautions: Fall Precaution Comments: watch O2 sats Restrictions Weight Bearing Restrictions: No Other Position/Activity Restrictions: per chart: WBAT in shoes with carbon plate inserts per wife       Mobility Bed Mobility               General bed mobility comments: Patient seated in recliner when therapist entered the room. Patient performed partial sit to stand x 5 with use of BUEs to prepapre for standing activity and Vs monitored. BP improved, HR WNL and o2 sat in high 90s with activity.  Transfers Overall transfer level: Needs assistance Equipment used: Rolling walker (2 wheeled) Transfers: Sit to/from Stand Sit to Stand: Min assist;+2 safety/equipment         General transfer comment: Patient  performed sit to stand x 2 from recliner height. BP monitored and patient unable to sustain prolonged standing after BP checked. Patient took several steps on second stand forward with RW and min assistance from therapist. Patient exhibited difficulty motor planning steps and then needed sitting rest break.    Balance Overall balance assessment: Needs assistance Sitting-balance support: Feet supported Sitting balance-Leahy Scale: Fair     Standing balance support: During functional activity;Bilateral upper extremity supported Standing balance-Leahy Scale: Poor                             ADL either performed or assessed with clinical judgement   ADL                                               Vision       Perception     Praxis      Cognition Arousal/Alertness: Awake/alert Behavior During Therapy: WFL for tasks assessed/performed Overall Cognitive Status: Impaired/Different from baseline Area of Impairment: Memory;Problem solving;Safety/judgement                   Current Attention Level: Selective Memory: Decreased short-term memory Following Commands: Follows one step commands consistently Safety/Judgement: Decreased awareness of safety Awareness: Emergent Problem Solving: Slow processing General Comments: Continues to improve cognitively but still mild deficits noted - especially with short term memory.  Exercises     Shoulder Instructions       General Comments      Pertinent Vitals/ Pain       Pain Assessment: No/denies pain  Home Living                                          Prior Functioning/Environment              Frequency  Min 2X/week        Progress Toward Goals  OT Goals(current goals can now be found in the care plan section)  Progress towards OT goals: Progressing toward goals  Acute Rehab OT Goals Patient Stated Goal: I want to be able to walk again  Plan  Discharge plan remains appropriate    Co-evaluation    PT/OT/SLP Co-Evaluation/Treatment: Yes Reason for Co-Treatment: For patient/therapist safety;To address functional/ADL transfers PT goals addressed during session: Mobility/safety with mobility OT goals addressed during session: Strengthening/ROM (functional mobility)      AM-PAC OT "6 Clicks" Daily Activity     Outcome Measure   Help from another person eating meals?: None Help from another person taking care of personal grooming?: A Little Help from another person toileting, which includes using toliet, bedpan, or urinal?: Total Help from another person bathing (including washing, rinsing, drying)?: A Lot Help from another person to put on and taking off regular upper body clothing?: A Little Help from another person to put on and taking off regular lower body clothing?: A Lot 6 Click Score: 15    End of Session Equipment Utilized During Treatment: Gait belt;Rolling walker  OT Visit Diagnosis: Repeated falls (R29.6);Other abnormalities of gait and mobility (R26.89);Muscle weakness (generalized) (M62.81);Other symptoms and signs involving cognitive function;Pain   Activity Tolerance Patient limited by fatigue   Patient Left in chair;with call bell/phone within reach;with chair alarm set;with family/visitor present   Nurse Communication Mobility status        Time: 8887-5797 OT Time Calculation (min): 27 min  Charges: OT General Charges $OT Visit: 1 Visit OT Treatments $Therapeutic Activity: 8-22 mins  Louis [name], OTR/L [place]  Office [phone] Pager: [place] 07/02/2019, 5:36 PM

## 2019-07-02 NOTE — Progress Notes (Signed)
NAME:  Louis [name], MRN:  [id number], DOB:  Oct 19, 1955, LOS: 7 ADMISSION DATE:  06/25/2019, CONSULTATION DATE:  07/01/2019 REFERRING MD:  Dr Bonner Puna, CHIEF COMPLAINT:  Respiratory arrest   Brief History   64 year old male admitted on June 25, 2019 in the setting of fever, tachypnea and some confusion.  Found to have enterococcal bacteremia of a urinary source.  Has been followed by infectious diseases, had a TEE which was unrevealing.  Was treated here with appropriate antibiotic therapy and his condition was improving and was to be discharged to inpatient rehab on June 30.  However, developed a respiratory arrest in the setting of unresponsiveness early in the morning on July 01, 2019.  History of present illness   The patient was obtunded and intubated on my arrival, I could not obtain history, see details listed above.  The rest of the history is obtained by chart review.  During the hospitalization while being treated for enterococcal bacteremia with ampicillin he was noted to have hyponatremia, hyperkalemia, some acute kidney injury and some volume overload.  On 6/29 fluids were held and he was given lasix.  Overnight from 6/29 through 6/30 he had some confusion, tachypnea.  By the morning of 6/30 he was unresponsive and moved to the ICU for intubation.    Past Medical History   Past Medical History:  Diagnosis Date   Anxiety    situational   BPH (benign prostatic hyperplasia)    on flomax   Bundle branch block    per prior PCP records   CHF (congestive heart failure) ([place])    COPD (chronic obstructive pulmonary disease) ([place])    Diverticulosis    by colonoscopy   Eczema    per prior PCP records   Elevated PSA 2015   peaked 6s, s/p benign biopsy 2014, sees urology [name]   Essential tremor    GERD (gastroesophageal reflux disease)    per prior PCP records   History of panic attacks    per prior PCP records   HTN (hypertension)    Mild intermittent  asthma in adult without complication    Obesity, Class I, BMI 30-34.9    Osteoporosis    DEXA 06/2013 with osteopenia - h/o ?wrist/hip fracture from AVN from chronic steroid use (asthma), took reclast for 1 year   Renal artery stenosis in 1 of 2 vessels ([place]) 2011   by [place]   Seasonal allergies    Sleep apnea    Thoracic scoliosis childhood   Transaminitis    per prior PCP records    [facility] Events    6/24 admission  6/30 move to ICU, intubation 6/30 bronchoscopy Consults:   Infectious disease Critical care Procedures:   Bronchoscopy 6/30 Significant Diagnostic Tests:   June 26, 2019 TTE: LVEF 55 to 32%, RV systolic function mildly reduced, aortic dilation, valves normal as visualized June 28 TEE: LVEF normal, RV systolic function normal, mitral and aortic valves normal Micro Data:   6/24 SARS COV 2 > negative 6/24 blood > enterococcus faecalis amp sensitive 6/24 urine culture  6/26 blood > negative Antimicrobials:    6/24 ceftriaxone >  6/25 ampicillin >  6/27 daptomycin >  Interim history/subjective:   Awake and alert this morning No overnight events Has no pain or discomfort at present Tolerating the vent Objective   Blood pressure 135/64, pulse (!) 56, temperature 98.8 F (37.1 C), temperature source Axillary, resp. rate 16, height 5\' 11"  (1.803 m), weight 95.2 kg, SpO2  100 %.    Vent Mode: PRVC FiO2 (%):  [40 %] 40 % Set Rate:  [16 bmp] 16 bmp Vt Set:  [600 mL] 600 mL PEEP:  [5 cmH20] 5 cmH20 Plateau Pressure:  [14 cmH20-18 cmH20] 14 cmH20   Intake/Output Summary (Last 24 hours) at 07/02/2019 0846 Last data filed at 07/02/2019 4540 Gross per 24 hour  Intake 1042.6 ml  Output 1125 ml  Net -82.4 ml   Filed Weights   06/29/19 0500 06/30/19 0445 07/01/19 0421  Weight: 100 kg 95.8 kg 95.2 kg    Examination: General: On vent, comfortable HENT: Endotracheal tube in place Lungs: Clear to auscultation bilaterally Cardiovascular: S1-S2  appreciated Abdomen: Soft, nontender  Resolved Hospital Problem list     Assessment & Plan:  Acute hypoxemic respiratory failure, hypercapnic respiratory failure -Obstructive lung disease -Underlying scoliosis Was doing well prior to event on 6/30 -Attempt at weaning and possible extubation -Bronchoscopy is unyielding so far  Chronic heart failure with preserved ejection fraction -Metoprolol, Norvasc  Essential tremor -Monitor  Hepatic steatosis with alcohol abuse -Trend LFTs  B12 deficiency -Thiamine, folate  Acute toxic and metabolic encephalopathy due to hypoxia -CT head is unrevealing -We will wean and possibly extubate  Electrolyte derangement -Hypokalemia-being addressed  Acute on chronic kidney disease -Continue to monitor  History of obstructive sleep apnea -BiPAP if needed -CPAP use  Discussed with spouse at bedside this morning  Best practice:  Diet: Interstitial lung disease Pain/Anxiety/Delirium protocol (if indicated): As needed VAP protocol (if indicated): In place DVT prophylaxis: Subcu heparin GI prophylaxis: Famotidine Glucose control: SSI Mobility: Bedrest Code Status: Full code Family Communication: Spoke with spouse at bedside Disposition: ICU  Labs   CBC: Recent Labs  Lab 06/25/19 1144 06/26/19 0620 06/28/19 0517 06/29/19 0509 06/30/19 0440 07/01/19 0316 07/01/19 0738  WBC 6.1   < > 5.2 6.5 6.7 6.6 5.2  NEUTROABS 5.5  --  3.6  --   --   --  3.9  HGB 10.4*   < > 8.7* 8.6* 8.6* 9.4* 9.9*  HCT 30.0*   < > 26.5* 26.5* 27.2* 30.6* 32.2*  MCV 99.7   < > 103.9* 105.2* 108.4* 110.9* 112.2*  PLT 133*   < > 118* 144* 173 304 339   < > = values in this interval not displayed.    Basic Metabolic Panel: Recent Labs  Lab 06/29/19 0509 06/30/19 0440 07/01/19 0316 07/01/19 0738 07/01/19 1509  NA 134* 136 141 139 142  K 3.7 3.9 4.0 5.3* 3.4*  CL 100 100 101 99 102  CO2 27 28 35* 29 31  GLUCOSE 107* 113* 133* 179* 115*  BUN 19  17 22 22  27*  CREATININE 1.30* 1.02 1.04 1.35* 1.24  CALCIUM 8.0* 8.0* 8.7* 8.7* 8.6*  MG  --   --   --  1.7 1.6*  PHOS  --   --   --  5.4* <1.0*   GFR: Estimated Creatinine Clearance: 72.8 mL/min (by C-G formula based on SCr of 1.24 mg/dL). Recent Labs  Lab 06/25/19 1308 06/26/19 0620 06/29/19 0509 06/30/19 0440 07/01/19 0316 07/01/19 0738 07/01/19 1509  WBC  --    < > 6.5 6.7 6.6 5.2  --   LATICACIDVEN 1.8  --   --   --   --  0.8 1.4   < > = values in this interval not displayed.    Liver Function Tests: Recent Labs  Lab 06/28/19 0517 06/29/19 0509 06/30/19 0440 07/01/19 0316 07/01/19  0738  AST 76* 68* 40 36 46*  ALT 70* 62* 51* 48* 51*  ALKPHOS 44 53 61 74 74  BILITOT 0.8 0.8 0.6 0.5 0.4  PROT 6.2* 6.1* 6.1* 6.2* 6.8  ALBUMIN 2.8* 2.6* 2.4* 2.7* 2.8*   No results for input(s): LIPASE, AMYLASE in the last 168 hours. Recent Labs  Lab 06/25/19 2122 07/01/19 0738 07/01/19 1509  AMMONIA 25 75* 34    ABG    Component Value Date/Time   PHART 7.324 (L) 07/01/2019 0855   PCO2ART 60.5 (H) 07/01/2019 0855   PO2ART 203 (H) 07/01/2019 0855   HCO3 30.6 (H) 07/01/2019 0855   ACIDBASEDEF 1.3 07/01/2019 0632   O2SAT 99.7 07/01/2019 0855     Coagulation Profile: No results for input(s): INR, PROTIME in the last 168 hours.  Cardiac Enzymes: Recent Labs  Lab 06/25/19 1335  CKTOTAL 344    HbA1C: Hgb A1c MFr Bld  Date/Time Value Ref Range Status  06/26/2019 06:20 AM 5.7 (H) 4.8 - 5.6 % Final    Comment:    (NOTE) Pre diabetes:          5.7%-6.4%  Diabetes:              >6.4%  Glycemic control for   <7.0% adults with diabetes     CBG: Recent Labs  Lab 07/01/19 1606 07/01/19 1945 07/02/19 0002 07/02/19 0319 07/02/19 0810  GLUCAP 116* 124* 144* 124* 137*    Review of Systems:   Not in pain or discomfort at present Past Medical History  He,  has a past medical history of Anxiety, BPH (benign prostatic hyperplasia), Bundle branch block, CHF  (congestive heart failure) ([place]), COPD (chronic obstructive pulmonary disease) ([place]), Diverticulosis, Eczema, Elevated PSA (2015), Essential tremor, GERD (gastroesophageal reflux disease), History of panic attacks, HTN (hypertension), Mild intermittent asthma in adult without complication, Obesity, Class I, BMI 30-34.9, Osteoporosis, Renal artery stenosis in 1 of 2 vessels ([place]) (2011), Seasonal allergies, Sleep apnea, Thoracic scoliosis (childhood), and Transaminitis.   Surgical History    Past Surgical History:  Procedure Laterality Date   APPLICATION OF WOUND VAC Left 05/09/2018   Procedure: Application Of Wound Vac;  Surgeon: Newt Minion, MD;  Location: [facility];  Service: Orthopedics;  Laterality: Left;   BIOPSY  04/30/2019   Procedure: BIOPSY;  Surgeon: Juanita Craver, MD;  Location: [facility];  Service: Endoscopy;;   COLONOSCOPY  12/2013   polyps, int hem, diverticulosis, rpt 5 yrs [name])   COLONOSCOPY WITH PROPOFOL N/A 04/30/2019   Procedure: COLONOSCOPY WITH PROPOFOL;  Surgeon: Juanita Craver, MD;  Location: [facility];  Service: Endoscopy;  Laterality: N/A;   ELBOW SURGERY Right 2008   golfer's elbow   ESOPHAGOGASTRODUODENOSCOPY (EGD) WITH PROPOFOL N/A 04/30/2019   Procedure: ESOPHAGOGASTRODUODENOSCOPY (EGD) WITH PROPOFOL;  Surgeon: Juanita Craver, MD;  Location: [facility];  Service: Endoscopy;  Laterality: N/A;   INGUINAL HERNIA REPAIR Bilateral childhood & ~1995   LAPAROTOMY N/A 01/05/2019   Procedure: EXPLORATORY LAPAROTOMY graham patch repair;  Surgeon: Benjamine Sprague, DO;  Location: [facility];  Service: General;  Laterality: N/A;   NASAL SEPTUM SURGERY  2013   deviated septum   ORIF ANKLE FRACTURE Left 05/09/2018   Procedure: OPEN REDUCTION INTERNAL FIXATION LEFT LISFRANC FRACTURE/DISLOCATION;  Surgeon: Newt Minion, MD;  Location: [facility];  Service: Orthopedics;  Laterality: Left;   POLYPECTOMY  04/30/2019   Procedure: POLYPECTOMY;  Surgeon: Juanita Craver, MD;  Location: [facility];  Service: Endoscopy;;   TEE WITHOUT CARDIOVERSION  N/A 06/29/2019   Procedure: TRANSESOPHAGEAL ECHOCARDIOGRAM (TEE);  Surgeon: Acie Fredrickson Wonda Cheng, MD;  Location: [facility] END[facility]COPY;  Service: Cardiovascular;  Laterality: N/A;   US ECHOCARDIOGRAPHY  02/2009   WNL, EF >55% Claiborne Billings)   US RENAL/AORTA Right 05/2009   1-59% diameter reduction renal artery, rec rpt 2 yrs     Social History   reports that he has never smoked. He has never used smokeless tobacco. He reports current alcohol use of about 14.0 standard drinks of alcohol per week. He reports that he does not use drugs.   Family History   His family history includes Alcohol abuse in his mother; Ataxia in his brother and sister; Cancer in his maternal grandmother; Cancer (age of onset: 35) in his father; Diabetes in his mother; Prostate cancer in his father; Stroke in his mother. There is no history of CAD.   Allergies Allergies  Allergen Reactions   Accupril [Quinapril Hcl] Cough   Nsaids     Perforated gastric ulcer     The patient is critically ill with multiple organ systems failure and requires high complexity decision making for assessment and support, frequent evaluation and titration of therapies, application of advanced monitoring technologies and extensive interpretation of multiple databases. Critical Care Time devoted to patient care services described in this note independent of APP/resident time (if applicable)  is 35 minutes.   Sherrilyn Rist MD [facility] Personal pager: [phone] If unanswered, please page CCM On-call: [phone]

## 2019-07-03 LAB — BASIC METABOLIC PANEL
Anion gap: 10 (ref 5–15)
BUN: 18 mg/dL (ref 8–23)
CO2: 30 mmol/L (ref 22–32)
Calcium: 8.4 mg/dL — ABNORMAL LOW (ref 8.9–10.3)
Chloride: 100 mmol/L (ref 98–111)
Creatinine, Ser: 1.06 mg/dL (ref 0.61–1.24)
GFR calc Af Amer: >60 mL/min (ref >60)
GFR calc non Af Amer: >60 mL/min (ref >60)
Glucose, Bld: 106 mg/dL — ABNORMAL HIGH (ref 70–99)
Potassium: 3 mmol/L — ABNORMAL LOW (ref 3.5–5.1)
Sodium: 140 mmol/L (ref 135–145)

## 2019-07-03 LAB — CBC WITH DIFFERENTIAL/PLATELET
Abs Immature Granulocytes: 0.13 10*3/uL — ABNORMAL HIGH (ref 0.00–0.07)
Basophils Absolute: 0 10*3/uL (ref 0.0–0.1)
Basophils Relative: 1 %
Eosinophils Absolute: 0.2 10*3/uL (ref 0.0–0.5)
Eosinophils Relative: 3 %
HCT: 24 % — ABNORMAL LOW (ref 39.0–52.0)
Hemoglobin: 8 g/dL — ABNORMAL LOW (ref 13.0–17.0)
Immature Granulocytes: 2 %
Lymphocytes Relative: 24 %
Lymphs Abs: 1.6 10*3/uL (ref 0.7–4.0)
MCH: 34.6 pg — ABNORMAL HIGH (ref 26.0–34.0)
MCHC: 33.3 g/dL (ref 30.0–36.0)
MCV: 103.9 fL — ABNORMAL HIGH (ref 80.0–100.0)
Monocytes Absolute: 0.9 10*3/uL (ref 0.1–1.0)
Monocytes Relative: 14 %
Neutro Abs: 3.8 10*3/uL (ref 1.7–7.7)
Neutrophils Relative %: 56 %
Platelets: 396 10*3/uL (ref 150–400)
RBC: 2.31 MIL/uL — ABNORMAL LOW (ref 4.22–5.81)
RDW: 13.9 % (ref 11.5–15.5)
WBC: 6.6 10*3/uL (ref 4.0–10.5)
nRBC: 0 % (ref 0.0–0.2)

## 2019-07-03 LAB — MAGNESIUM: Magnesium: 1.6 mg/dL — ABNORMAL LOW (ref 1.7–2.4)

## 2019-07-03 LAB — VITAMIN B1: Vitamin B1 (Thiamine): 66.6 nmol/L (ref 66.5–200.0)

## 2019-07-03 MED ORDER — ACETAMINOPHEN 325 MG PO TABS
650.0000 mg | ORAL_TABLET | Freq: Four times a day (QID) | ORAL | Status: DC | PRN
  Administered 2019-07-03: 650 mg via ORAL
  Filled 2019-07-03: qty 2

## 2019-07-03 MED ORDER — METOPROLOL SUCCINATE ER 50 MG PO TB24
50.0000 mg | ORAL_TABLET | Freq: Every day | ORAL | Status: DC
  Administered 2019-07-04 – 2019-07-07 (×4): 50 mg via ORAL
  Filled 2019-07-03 (×4): qty 1

## 2019-07-03 MED ORDER — POTASSIUM CHLORIDE 20 MEQ PO PACK
40.0000 meq | PACK | ORAL | Status: AC
  Administered 2019-07-03: 40 meq via ORAL
  Filled 2019-07-03: qty 2

## 2019-07-03 MED ORDER — POTASSIUM CHLORIDE CRYS ER 20 MEQ PO TBCR
40.0000 meq | EXTENDED_RELEASE_TABLET | ORAL | Status: DC
  Administered 2019-07-03: 40 meq via ORAL
  Filled 2019-07-03: qty 2

## 2019-07-03 MED ORDER — LINEZOLID 600 MG PO TABS
600.0000 mg | ORAL_TABLET | Freq: Two times a day (BID) | ORAL | Status: DC
  Administered 2019-07-03 – 2019-07-07 (×7): 600 mg via ORAL
  Filled 2019-07-03 (×9): qty 1

## 2019-07-03 MED ORDER — ACETAMINOPHEN 160 MG/5ML PO SOLN: 650.0000 mg | Freq: Four times a day (QID) | ORAL | Status: DC | PRN

## 2019-07-03 NOTE — Progress Notes (Addendum)
NAME:  Louis [name], MRN:  [id number], DOB:  12-30-1955, LOS: 8 ADMISSION DATE:  06/25/2019, CONSULTATION DATE:  07/01/2019 REFERRING MD:  Dr Bonner Puna, CHIEF COMPLAINT:  Respiratory arrest   Brief History   64 year old male admitted on June 25, 2019 in the setting of fever, tachypnea and some confusion.  Found to have enterococcal bacteremia of a urinary source.  Has been followed by infectious diseases, had a TEE which was unrevealing.  Was treated here with appropriate antibiotic therapy and his condition was improving and was to be discharged to inpatient rehab on June 30.  However, developed a respiratory arrest in the setting of unresponsiveness early in the morning on July 01, 2019.  History of present illness   The patient was obtunded and intubated on my arrival, I could not obtain history, see details listed above.  The rest of the history is obtained by chart review.  During the hospitalization while being treated for enterococcal bacteremia with ampicillin he was noted to have hyponatremia, hyperkalemia, some acute kidney injury and some volume overload.  On 6/29 fluids were held and he was given lasix.  Overnight from 6/29 through 6/30 he had some confusion, tachypnea.  By the morning of 6/30 he was unresponsive and moved to the ICU for intubation.    Past Medical History   Past Medical History:  Diagnosis Date  . Anxiety    situational  . BPH (benign prostatic hyperplasia)    on flomax  . Bundle branch block    per prior PCP records  . CHF (congestive heart failure) ([place])   . COPD (chronic obstructive pulmonary disease) ([place])   . Diverticulosis    by colonoscopy  . Eczema    per prior PCP records  . Elevated PSA 2015   peaked 6s, s/p benign biopsy 2014, sees urology [name] ([place])  . Essential tremor   . GERD (gastroesophageal reflux disease)    per prior PCP records  . History of panic attacks    per prior PCP records  . HTN (hypertension)   . Mild intermittent  asthma in adult without complication   . Obesity, Class I, BMI 30-34.9   . Osteoporosis    DEXA 06/2013 with osteopenia - h/o ?wrist/hip fracture from AVN from chronic steroid use (asthma), took reclast for 1 year  . Renal artery stenosis in 1 of 2 vessels ([place]) 2011   by [place]  . Seasonal allergies   . Sleep apnea   . Thoracic scoliosis childhood  . Transaminitis    per prior PCP records    [facility] Events    6/24 admission  6/30 move to ICU, intubation 6/30 bronchoscopy 7/1 extubated  Consults:   Infectious disease Critical care  Procedures:   Bronchoscopy 6/30  Significant Diagnostic Tests:   June 26, 2019 TTE: LVEF 55 to 44%, RV systolic function mildly reduced, aortic dilation, valves normal as visualized June 28 TEE: LVEF normal, RV systolic function normal, mitral and aortic valves normal  Micro Data:   6/24 SARS COV 2 > negative 6/24 blood > enterococcus faecalis amp sensitive 6/24 urine culture  6/26 blood > negative 6/30 Respiratory cultures negative to date  Antimicrobials:    6/24 ceftriaxone >  6/25 ampicillin >  6/27 daptomycin >   Interim history/subjective:   Awake and alert this morning No overnight events Has no pain or discomfort at present Tolerating the vent  Objective   Blood pressure (!) 168/83, pulse (!) 57, temperature 98.6 F (37  C), temperature source Oral, resp. rate (!) 28, height 5\' 11"  (1.803 m), weight 95.2 kg, SpO2 (!) 88 %.        Intake/Output Summary (Last 24 hours) at 07/03/2019 1008 Last data filed at 07/03/2019 0600 Gross per 24 hour  Intake 2044.95 ml  Output 1600 ml  Net 444.95 ml   Filed Weights   06/29/19 0500 06/30/19 0445 07/01/19 0421  Weight: 100 kg 95.8 kg 95.2 kg    Examination: General: Off vent, on room air comfortable HENT: Moist oral mucosa Lungs: Clear to auscultation bilaterally Cardiovascular: S1-S2 appreciated Abdomen: Soft, nontender  Resolved Hospital Problem list      Assessment & Plan:  Acute hypoxemic respiratory failure, hypercapnic respiratory failure -Obstructive lung disease -Underlying scoliosis Was doing well prior to event on 6/30 -Bronchoscopy remains unyielding so far -Being treated for possible healthcare associated pneumonia  Chronic heart failure with preserved ejection fraction -Metoprolol, Norvasc -Dose of metoprolol reduced to 50 from 150(heart rate currently running in the 60s but has been dipping down) -May need increased once more stable  Enterococcal bacteremia and UTI -Linezolid  Essential tremor -Monitor  Hepatic steatosis with alcohol abuse -Trend LFTs -Stable 7/1  B12 deficiency -Thiamine, folate  Acute toxic and metabolic encephalopathy due to hypoxia -CT head is unrevealing -Appears to have fully resolved  Electrolyte derangement -Hypokalemia-being addressed  Acute on chronic kidney disease -Continue to monitor  History of obstructive sleep apnea -CPAP use  Out of bed as tolerated We will transfer back to the hospitalist service  Best practice:  Diet: Diet as tolerated Pain/Anxiety/Delirium protocol (if indicated): As needed VAP protocol (if indicated): In place DVT prophylaxis: Subcu heparin GI prophylaxis: Famotidine Glucose control: SSI Mobility: Bedrest Code Status: Full code Family Communication: Spoke with spouse at bedside 7/1 Disposition: ICU  Labs   CBC: Recent Labs  Lab 06/28/19 0517 06/29/19 0509 06/30/19 0440 07/01/19 0316 07/01/19 0738 07/02/19 0901 07/03/19 0633  WBC 5.2   < > 6.7 6.6 5.2 5.1 6.6  NEUTROABS 3.6  --   --   --  3.9 3.1 3.8  HGB 8.7*   < > 8.6* 9.4* 9.9* 8.0* 8.0*  HCT 26.5*   < > 27.2* 30.6* 32.2* 24.9* 24.0*  MCV 103.9*   < > 108.4* 110.9* 112.2* 105.1* 103.9*  PLT 118*   < > 173 304 339 372 396   < > = values in this interval not displayed.    Basic Metabolic Panel: Recent Labs  Lab 07/01/19 0316 07/01/19 0738 07/01/19 1509 07/02/19 0901  07/02/19 1735 07/03/19 0633  NA 141 139 142 143  --  140  K 4.0 5.3* 3.4* 3.0*  --  3.0*  CL 101 99 102 103  --  100  CO2 35* 29 31 29   --  30  GLUCOSE 133* 179* 115* 202*  --  106*  BUN 22 22 27* 24*  --  18  CREATININE 1.04 1.35* 1.24 0.96  --  1.06  CALCIUM 8.7* 8.7* 8.6* 8.6*  --  8.4*  MG  --  1.7 1.6* 1.5* 1.9 1.6*  PHOS  --  5.4* <1.0* <1.0* 1.2*  --    GFR: Estimated Creatinine Clearance: 85.1 mL/min (by C-G formula based on SCr of 1.06 mg/dL). Recent Labs  Lab 07/01/19 0316 07/01/19 0738 07/01/19 1509 07/02/19 0901 07/03/19 0633  WBC 6.6 5.2  --  5.1 6.6  LATICACIDVEN  --  0.8 1.4  --   --     Liver  Function Tests: Recent Labs  Lab 06/29/19 0509 06/30/19 0440 07/01/19 0316 07/01/19 0738 07/02/19 0901  AST 68* 40 36 46* 31  ALT 62* 51* 48* 51* 36  ALKPHOS 53 61 74 74 64  BILITOT 0.8 0.6 0.5 0.4 0.6  PROT 6.1* 6.1* 6.2* 6.8 5.9*  ALBUMIN 2.6* 2.4* 2.7* 2.8* 2.4*   No results for input(s): LIPASE, AMYLASE in the last 168 hours. Recent Labs  Lab 07/01/19 0738 07/01/19 1509  AMMONIA 75* 34    ABG    Component Value Date/Time   PHART 7.324 (L) 07/01/2019 0855   PCO2ART 60.5 (H) 07/01/2019 0855   PO2ART 203 (H) 07/01/2019 0855   HCO3 30.6 (H) 07/01/2019 0855   ACIDBASEDEF 1.3 07/01/2019 0632   O2SAT 99.7 07/01/2019 0855     Coagulation Profile: No results for input(s): INR, PROTIME in the last 168 hours.  Cardiac Enzymes: No results for input(s): CKTOTAL, CKMB, CKMBINDEX, TROPONINI in the last 168 hours.  HbA1C: Hgb A1c MFr Bld  Date/Time Value Ref Range Status  06/26/2019 06:20 AM 5.7 (H) 4.8 - 5.6 % Final    Comment:    (NOTE) Pre diabetes:          5.7%-6.4%  Diabetes:              >6.4%  Glycemic control for   <7.0% adults with diabetes     CBG: Recent Labs  Lab 07/02/19 0002 07/02/19 0319 07/02/19 0810 07/02/19 1154 07/02/19 1616  GLUCAP 144* 124* 137* 104* 86    Review of Systems:   Not in pain or discomfort at  present  Past Medical History  He,  has a past medical history of Anxiety, BPH (benign prostatic hyperplasia), Bundle branch block, CHF (congestive heart failure) ([place]), COPD (chronic obstructive pulmonary disease) ([place]), Diverticulosis, Eczema, Elevated PSA (2015), Essential tremor, GERD (gastroesophageal reflux disease), History of panic attacks, HTN (hypertension), Mild intermittent asthma in adult without complication, Obesity, Class I, BMI 30-34.9, Osteoporosis, Renal artery stenosis in 1 of 2 vessels ([place]) (2011), Seasonal allergies, Sleep apnea, Thoracic scoliosis (childhood), and Transaminitis.   Surgical History    Past Surgical History:  Procedure Laterality Date  . APPLICATION OF WOUND VAC Left 05/09/2018   Procedure: Application Of Wound Vac;  Surgeon: Newt Minion, MD;  Location: [facility];  Service: Orthopedics;  Laterality: Left;  . BIOPSY  04/30/2019   Procedure: BIOPSY;  Surgeon: Juanita Craver, MD;  Location: [facility];  Service: Endoscopy;;  . COLONOSCOPY  12/2013   polyps, int hem, diverticulosis, rpt 5 yrs ([place])  . COLONOSCOPY WITH PROPOFOL N/A 04/30/2019   Procedure: COLONOSCOPY WITH PROPOFOL;  Surgeon: Juanita Craver, MD;  Location: [facility];  Service: Endoscopy;  Laterality: N/A;  . ELBOW SURGERY Right 2008   golfer's elbow  . ESOPHAGOGASTRODUODENOSCOPY (EGD) WITH PROPOFOL N/A 04/30/2019   Procedure: ESOPHAGOGASTRODUODENOSCOPY (EGD) WITH PROPOFOL;  Surgeon: Juanita Craver, MD;  Location: [facility];  Service: Endoscopy;  Laterality: N/A;  . INGUINAL HERNIA REPAIR Bilateral childhood & ~1995  . LAPAROTOMY N/A 01/05/2019   Procedure: EXPLORATORY LAPAROTOMY graham patch repair;  Surgeon: Benjamine Sprague, DO;  Location: [facility];  Service: General;  Laterality: N/A;  . NASAL SEPTUM SURGERY  2013   deviated septum  . ORIF ANKLE FRACTURE Left 05/09/2018   Procedure: OPEN REDUCTION INTERNAL FIXATION LEFT LISFRANC FRACTURE/DISLOCATION;  Surgeon: Newt Minion, MD;  Location: [facility];   Service: Orthopedics;  Laterality: Left;  . POLYPECTOMY  04/30/2019   Procedure: POLYPECTOMY;  Surgeon:  Juanita Craver, MD;  Location: [facility];  Service: Endoscopy;;  . TEE WITHOUT CARDIOVERSION N/A 06/29/2019   Procedure: TRANSESOPHAGEAL ECHOCARDIOGRAM (TEE);  Surgeon: Acie Fredrickson Wonda Cheng, MD;  Location[facility];  Service: Cardiovascular;  Laterality: N/A;  . US ECHOCARDIOGRAPHY  02/2009   WNL, EF >55% [facility])  . US RENAL/AORTA Right 05/2009   1-59% diameter reduction renal artery, rec rpt 2 yrs     Social History   reports that he has never smoked. He has never used smokeless tobacco. He reports current alcohol use of about 14.0 standard drinks of alcohol per week. He reports that he does not use drugs.   Family History   His family history includes Alcohol abuse in his mother; Ataxia in his brother and sister; Cancer in his maternal grandmother; Cancer (age of onset: 44) in his father; Diabetes in his mother; Prostate cancer in his father; Stroke in his mother. There is no history of CAD.   Allergies Allergies  Allergen Reactions  . Accupril [Quinapril Hcl] Cough  . Nsaids     Perforated gastric ulcer     The patient is critically ill with multiple organ systems failure and requires high complexity decision making for assessment and support, frequent evaluation and titration of therapies, application of advanced monitoring technologies and extensive interpretation of multiple databases. Critical Care Time devoted to patient care services described in this note independent of APP/resident time (if applicable)  is 30 minutes.   Sherrilyn Rist MD [facility] Personal pager: [phone] If unanswered, please page CCM On-call: [phone]

## 2019-07-03 NOTE — Progress Notes (Addendum)
[facility] for Infectious Disease    Date of Admission:  06/25/2019   Total days of antibiotics 9/day 3 of linezolid and cefepime           ID: Louis [name] is a 64 y.o. male with   Active Problems:   HTN (hypertension)   Anxiety state   Essential tremor   Renal artery stenosis in 1 of 2 vessels (H[facility])   Transaminitis   SOB (shortness of breath)   Chronic respiratory failure with hypoxia and hypercapnia ([facility])   OSA (obstructive sleep apnea)   Hyperkalemia   Hyponatremia   ARF (acute renal failure) ([place])   Bacteremia due to Enterococcus    Subjective: Afebrile. His wife reports some confusion this morning. Has some mild productive cough still.  Medications:  . amLODipine  5 mg Oral Daily  . Chlorhexidine Gluconate Cloth  6 each Topical Daily  . docusate sodium  100 mg Oral BID  . famotidine  20 mg Oral BID  . folic acid  1 mg Oral Daily  . gabapentin  300 mg Oral BID  . heparin  5,000 Units Subcutaneous Q8H  . linezolid  600 mg Oral Q12H  . mouth rinse  15 mL Mouth Rinse BID  . metoprolol succinate  50 mg Oral Daily  . multivitamin with minerals  1 tablet Oral Daily  . polyethylene glycol  17 g Oral Daily  . potassium chloride  40 mEq Oral Q4H  . thiamine  100 mg Oral Daily    Objective: Vital signs in last 24 hours: Temp:  [97.7 F (36.5 C)-99.7 F (37.6 C)] 97.7 F (36.5 C) (07/02 1200) Pulse Rate:  [41-92] 63 (07/02 1600) Resp:  [17-32] 25 (07/02 1600) BP: (133-174)/(57-106) 160/65 (07/02 1600) SpO2:  [90 %-100 %] 91 % (07/02 1600) Physical Exam  Constitutional: He is oriented to person, place. He appears well-developed and well-nourished. No distress.  HENT:  Mouth/Throat: Oropharynx is clear and moist. No oropharyngeal exudate.  Cardiovascular: Normal rate, regular rhythm and normal heart sounds. Exam reveals no gallop and no friction rub.  No murmur heard.  Pulmonary/Chest: Effort normal and breath sounds normal. No respiratory distress. He has  no wheezes.  Abdominal: Soft. Bowel sounds are normal. He exhibits no distension. There is no tenderness.  Lymphadenopathy:  He has no cervical adenopathy.  Neurological: He is alert and oriented to person, place. Skin: Skin is warm and dry. No rash noted. No erythema.  Psychiatric: He has a normal mood and affect. His behavior is normal.     Lab Results Recent Labs    07/02/19 0901 07/03/19 0633  WBC 5.1 6.6  HGB 8.0* 8.0*  HCT 24.9* 24.0*  NA 143 140  K 3.0* 3.0*  CL 103 100  CO2 29 30  BUN 24* 18  CREATININE 0.96 1.06   Liver Panel Recent Labs    07/01/19 0738 07/02/19 0901  PROT 6.8 5.9*  ALBUMIN 2.8* 2.4*  AST 46* 31  ALT 51* 36  ALKPHOS 74 64  BILITOT 0.4 0.6    Microbiology: BAL - reincubating Studies/Results: No results found.   Assessment/Plan: Enterococcal bacteremia with UTI = continue on linezolid, plan to treat for 14 days using day 1 as 6/28. Can switch linezolid to oral  Pneumonia = BAL has some preliminary growth, will continue cefepime for now. Await to see what is isolated on culture to narrow abtx accordingly.  AMS= may have some icu-psychosis. Continue with daytime lights.  Dr  Lucianne Lei dam to see over the weekend.  [facility] Cell: [phone] Pager: [phone]  07/03/2019, 5:01 PM

## 2019-07-03 NOTE — Progress Notes (Signed)
Physical Therapy Treatment Patient Details Name: Louis [name] MRN: [id number] DOB: 05-14-1955 Today's Date: 07/03/2019    History of Present Illness Louis [name] is a 64 y.o. male with a history of scoliosis with restrictive and obstructive lung disease, chronic HFpEF, OSA and nocturnal hypoxia on CPAP and 2L O2, essential tremor, hepatic steatosis, and perforated duodenal ulcer s/p [name] Jan 2021 who presented to the ED with increasing falls and progressive weakness. He was found to be febrile to 101.72F and tachypneic though not hypoxemic. Work up included CXR with indistinct hazy left-sided opacity and subsequent CTA chest after d-dimer was 1.90 which had motion degradation without PE or focal consolidation. There were incidental findings including renal lesions for which abdominal MR was recommended.    PT Comments    Patient making excellent progress with acute therapy and remains highly motivated to mobilize. He continues to improve strength for to initiate power up for transfers but continues to require min assist to steady with rising. Patient was able to increase ambulation distance today to ~40' with RW and min-mod assist to mange walker position and steady. Close chair follow for safety. Patient desaturated to 86-88% on RA with gait and dropped to 81% for short period. 2L/min applied and SpO2 remained in 90's with rest breaks for pursed lip breathing. Pt required seated rest once fatigued and ended session on RA saturating at 95%. Acute PT will continue to progress patient as able, he remains and excellent candidate for intensive therapy follow up at CIR.    Follow Up Recommendations  CIR     Equipment Recommendations  Rolling walker with 5" wheels    Recommendations for Other Services       Precautions / Restrictions Precautions Precautions: Fall Precaution Comments: watch O2 sats Restrictions Other Position/Activity Restrictions: per chart: WBAT in shoes with carbon  plate inserts per wife    Mobility  Bed Mobility Overal bed mobility: Needs Assistance Bed Mobility: Supine to Sit     Supine to sit: Min assist;HOB elevated     General bed mobility comments: Min assist and cues for LE's sequencing and to use bed rail. assist to steady at EOB as pt scoots forward to place feet on floor.  Transfers Overall transfer level: Needs assistance Equipment used: Rolling walker (2 wheeled) Transfers: Sit to/from Stand Sit to Stand: Min assist;+2 safety/equipment         General transfer comment: Pt with improved strength for power up. remains unsteady with rising requiring assist to steady. pt with improved recall for saf reach back to sit in recliner this date.   Ambulation/Gait Ambulation/Gait assistance: +2 safety/equipment;Mod assist;Min assist Gait Distance (Feet): 40 Feet Assistive device: Rolling walker (2 wheeled) Gait Pattern/deviations: Step-to pattern;Decreased stride length;Shuffle;Trunk flexed Gait velocity: decreased   General Gait Details: Min-Mod assist for gait to manage RW position and steady intermittently. pt requried cues throughout for posture to improve step length and SpO2. pt dropped to 86-88% on RA with mobility and 2L/min applied. SpO2 improved to 90's with 2L. Pt required intermittent rest breaks standing with pursed lip breathing due to SOB.    Stairs        Wheelchair Mobility    Modified Rankin (Stroke Patients Only)       Balance Overall balance assessment: Needs assistance Sitting-balance support: Feet supported Sitting balance-Leahy Scale: Fair     Standing balance support: During functional activity;Bilateral upper extremity supported Standing balance-Leahy Scale: Poor  Cognition Arousal/Alertness: Awake/alert Behavior During Therapy: WFL for tasks assessed/performed Overall Cognitive Status: Impaired/Different from baseline Area of Impairment: Memory;Safety/judgement;Following  commands        Memory: Decreased short-term memory Following Commands: Follows one step commands consistently;Follows multi-step commands with increased time Safety/Judgement: Decreased awareness of safety         Exercises      General Comments General comments (skin integrity, edema, etc.): HR in 80's at rest. Reached max of 128bpm with gait. Pt's BP 164/75 after gait. EOS pt left on RA saturating at 95% while resting in recliner.      Pertinent Vitals/Pain Pain Assessment: No/denies pain           PT Goals (current goals can now be found in the care plan section) Acute Rehab PT Goals Patient Stated Goal: I want to be able to walk again PT Goal Formulation: With patient Time For Goal Achievement: 07/10/19 Potential to Achieve Goals: Good Progress towards PT goals: Progressing toward goals    Frequency    Min 3X/week      PT Plan Current plan remains appropriate       AM-PAC PT "6 Clicks" Mobility   Outcome Measure  Help needed turning from your back to your side while in a flat bed without using bedrails?: A Lot Help needed moving from lying on your back to sitting on the side of a flat bed without using bedrails?: A Lot Help needed moving to and from a bed to a chair (including a wheelchair)?: A Lot Help needed standing up from a chair using your arms (e.g., wheelchair or bedside chair)?: A Little Help needed to walk in hospital room?: A Lot Help needed climbing 3-5 steps with a railing? : Total 6 Click Score: 12    End of Session Equipment Utilized During Treatment: Gait belt Activity Tolerance: Patient tolerated treatment well Patient left: in chair;with call bell/phone within reach;with family/visitor present Nurse Communication: Mobility status PT Visit Diagnosis: Other abnormalities of gait and mobility (R26.89)     Time: 1031-5945 PT Time Calculation (min) (ACUTE ONLY): 24 min  Charges:  $Gait Training: 8-22 mins $Therapeutic Activity: 8-22  mins                     Verner Mould, DPT Acute Rehabilitation Services  Office [phone] Pager [phone]  07/03/2019 2:00 PM

## 2019-07-03 NOTE — Progress Notes (Signed)
eLink Physician-Brief Progress Note Patient Name: Louis [name] DOB: [birth year] MRN: [id number]   Date of Service  07/03/2019  HPI/Events of Note  Patient now extubated Request to shift acetaminophen to PO  eICU Interventions  Order placed     Intervention Category Minor Interventions: Routine modifications to care plan (e.g. PRN medications for pain, fever)  Louis [name] Louis [name] 07/03/2019, 6:37 AM

## 2019-07-03 NOTE — TOC Progression Note (Signed)
Transition of Care [facility]) - Progression Note    Patient Details  Name: Louis [name] MRN: [id number] Date of Birth: 1955-06-08  Transition of Care [facility]) CM/SW Contact  Leeroy Cha, RN Phone Number: 07/03/2019, 8:33 AM  Clinical Narrative:    On room air does desat. Down to 88% with exertion. Iv maxipime and zyvox/ labs wnls.  ID approved for CIR. Alert and orientated.   Plan follow for transfer to CIR and toc needs.    Expected Discharge Plan: IP Rehab Facility Barriers to Discharge: Continued Medical Work up  Expected Discharge Plan and Services Expected Discharge Plan: [place]   Discharge Planning Services: CM Consult Post Acute Care Choice: IP Rehab Living arrangements for the past 2 months: Single Family Home                                       Social Determinants of Health (SDOH) Interventions    Readmission Risk Interventions No flowsheet data found.

## 2019-07-03 NOTE — Progress Notes (Signed)
Inpatient Rehab Admissions Coordinator:   Note pt extubated yesterday and able to participate with therapy.  I have no beds available for this patient to admit through the weekend.  I will f/u on Monday to see how he is doing and discuss bed availability for possible admission next week.   Shann Medal, PT, DPT Admissions Coordinator [phone] 07/03/19  10:35 AM

## 2019-07-04 LAB — COMPREHENSIVE METABOLIC PANEL
ALT: 35 U/L (ref 0–44)
AST: 39 U/L (ref 15–41)
Albumin: 2.9 g/dL — ABNORMAL LOW (ref 3.5–5.0)
Alkaline Phosphatase: 63 U/L (ref 38–126)
Anion gap: 10 (ref 5–15)
BUN: 15 mg/dL (ref 8–23)
CO2: 29 mmol/L (ref 22–32)
Calcium: 8.8 mg/dL — ABNORMAL LOW (ref 8.9–10.3)
Chloride: 99 mmol/L (ref 98–111)
Creatinine, Ser: 0.92 mg/dL (ref 0.61–1.24)
GFR calc Af Amer: >60 mL/min (ref >60)
GFR calc non Af Amer: >60 mL/min (ref >60)
Glucose, Bld: 130 mg/dL — ABNORMAL HIGH (ref 70–99)
Potassium: 3.4 mmol/L — ABNORMAL LOW (ref 3.5–5.1)
Sodium: 138 mmol/L (ref 135–145)
Total Bilirubin: 0.9 mg/dL (ref 0.3–1.2)
Total Protein: 6.7 g/dL (ref 6.5–8.1)

## 2019-07-04 LAB — MAGNESIUM: Magnesium: 1.4 mg/dL — ABNORMAL LOW (ref 1.7–2.4)

## 2019-07-04 LAB — CULTURE, RESPIRATORY W GRAM STAIN
Culture: NORMAL
Gram Stain: NONE SEEN

## 2019-07-04 LAB — CBC WITH DIFFERENTIAL/PLATELET
Abs Immature Granulocytes: 0.12 10*3/uL — ABNORMAL HIGH (ref 0.00–0.07)
Basophils Absolute: 0.1 10*3/uL (ref 0.0–0.1)
Basophils Relative: 1 %
Eosinophils Absolute: 0.2 10*3/uL (ref 0.0–0.5)
Eosinophils Relative: 3 %
HCT: 25.6 % — ABNORMAL LOW (ref 39.0–52.0)
Hemoglobin: 8.4 g/dL — ABNORMAL LOW (ref 13.0–17.0)
Immature Granulocytes: 2 %
Lymphocytes Relative: 22 %
Lymphs Abs: 1.3 10*3/uL (ref 0.7–4.0)
MCH: 34.1 pg — ABNORMAL HIGH (ref 26.0–34.0)
MCHC: 32.8 g/dL (ref 30.0–36.0)
MCV: 104.1 fL — ABNORMAL HIGH (ref 80.0–100.0)
Monocytes Absolute: 0.8 10*3/uL (ref 0.1–1.0)
Monocytes Relative: 14 %
Neutro Abs: 3.4 10*3/uL (ref 1.7–7.7)
Neutrophils Relative %: 58 %
Platelets: 450 10*3/uL — ABNORMAL HIGH (ref 150–400)
RBC: 2.46 MIL/uL — ABNORMAL LOW (ref 4.22–5.81)
RDW: 13.9 % (ref 11.5–15.5)
WBC: 5.8 10*3/uL (ref 4.0–10.5)
nRBC: 0 % (ref 0.0–0.2)

## 2019-07-04 LAB — PHOSPHORUS: Phosphorus: 2.4 mg/dL — ABNORMAL LOW (ref 2.5–4.6)

## 2019-07-04 MED ORDER — POTASSIUM CHLORIDE CRYS ER 20 MEQ PO TBCR
40.0000 meq | EXTENDED_RELEASE_TABLET | Freq: Once | ORAL | Status: AC
  Administered 2019-07-04: 40 meq via ORAL
  Filled 2019-07-04: qty 2

## 2019-07-04 MED ORDER — LOSARTAN POTASSIUM 50 MG PO TABS
50.0000 mg | ORAL_TABLET | Freq: Every day | ORAL | Status: DC
  Administered 2019-07-04 – 2019-07-06 (×3): 50 mg via ORAL
  Filled 2019-07-04 (×3): qty 1

## 2019-07-04 MED ORDER — MAGNESIUM SULFATE 2 GM/50ML IV SOLN
2.0000 g | Freq: Once | INTRAVENOUS | Status: AC
  Administered 2019-07-04: 2 g via INTRAVENOUS

## 2019-07-04 MED ORDER — VITAMIN B-12 1000 MCG PO TABS
1000.0000 ug | ORAL_TABLET | Freq: Every day | ORAL | Status: DC
  Administered 2019-07-04 – 2019-07-07 (×4): 1000 ug via ORAL
  Filled 2019-07-04 (×4): qty 1

## 2019-07-04 MED ORDER — SODIUM PHOSPHATES 45 MMOLE/15ML IV SOLN
9.0000 mmol | Freq: Once | INTRAVENOUS | Status: AC
  Administered 2019-07-04: 9 mmol via INTRAVENOUS
  Filled 2019-07-04: qty 3

## 2019-07-04 NOTE — Progress Notes (Signed)
      INFECTIOUS DISEASE ATTENDING ADDENDUM:   Date: 07/04/2019  Patient name: Louis [name]  Medical record number: [id number]  Date of birth: 1955/03/25   Normal flora on BAL culture  DC cefepime  Continue po zyvox    Alcide Evener 07/04/2019, 1:47 PM

## 2019-07-04 NOTE — Progress Notes (Signed)
PROGRESS NOTE  Louis [name]  DOB: Mar 05, 1955  PCP: Rutherford Guys, MD QQV:[id number]  DOA: 06/25/2019  LOS: 9 days   Chief Complaint  Patient presents with  . Tremors  . Fall   Brief narrative: 64 year old male with PMH significant for HTN, CHF, COPD, sleep apnea, GERD, BPH, anxiety, essential tremor.   Patient presented to the ED on 6/24 with fever, tachypnea and confusion.   He was admitted to hospitalist service.  Urine culture and blood culture sent on admission grew Enterococcus.  He was followed by infectious disease.  He underwent TEE on 6/28 that did not show any vegetation.  His condition improved with appropriate antibiotic therapy and was to be discharged to inpatient rehab on June 30. However, overnight from 6/29 through 6/30 he had some confusion, tachypnea.  Apparently he did not have his CPAP mask on that night.  By the morning of 6/30 he was unresponsive.  Blood gas shows pH of 7.00 and PCO2 elevated to more than 120.  Patient underwent intubation and was transferred to ICU. 6/30, underwent bronchoscopy 7/1, extubated 7/2, transfer out to hospitalist service again.  Subjective: Patient was seen and examined this morning.  Pleasant middle-aged Caucasian male.   Sitting up in chair.  Not on supplemental oxygen. Not in distress.  Feels better than presentation. Chart reviewed. No fever, heart rate in 60s, blood pressure seems to be trending up mostly in 150s. Hemoglobin runs low at 8 with MCV 104  Assessment/Plan: Enterococcal bacteremia and UTI -Currently on linezolid with a plan to treat for 14 days (6/28-7/11).  Healthcare associated pneumonia Acute hypoxemic hypercapnic respiratory failure History of COPD, scoliosis -Patient had hypercapnic respiratory arrest on 6/30. -Underwent bronchoscopy on 6/30 -Bronchoalveolar lavage showed some preliminary growth.  Currently on IV cefepime.  ID following.  Acute toxic and metabolic encephalopathy due to  hypercapnia -CT head is unrevealing -Appears to have fully resolved  Essential hypertension Chronic heart failure with preserved ejection fraction -Home meds include Norvasc 5 mg daily, losartan 100 mg daily, Toprol 150 mg daily, Aldactone 25 mg daily, -Currently on reduced dose of metoprolol (due to bradycardia) and amlodipine.  Losartan and Aldactone on hold.  Blood pressure trending up.  I would resume losartan today at a lower dose of 50 mg daily.  Chronic macrocytic anemia Vitamin B12 deficiency - hemoglobin has been running mostly between 9-10 since Jan 2021, persistent macrocytosis. -Patient reports recent EGD and colonoscopy by Dr. [name], reports negative for any significant findings. -Vitamin B12 level 237 on 6/24.  Started on supplement. -Ferritin level more than 2000. -Hemoglobin at 8 today.  Continue to monitor.  No active bleeding. -Continue Protonix.  Hypokalemia/hypomagnesemia/hypophosphatemia -Labs this morning shows potassium 3.4, magnesium 1.4, phosphorus 2.4. -Replacements ordered.  Recheck tomorrow.  AKI on CKD 2 -Creatinine was elevated at 1.99 on 6/24. -Improved to normal now.  Essential tremor -On beta-blocker.  Hepatic steatosis with history of alcohol abuse -AST/ALT currently normal range.  History of obstructive sleep apnea -Nightly CPAP use  Mobility: Encourage ambulation.  PT eval obtained.  CIR recommended. Code Status:   Code Status: Full Code  Nutritional status: Body mass index is 30.13 kg/m. Nutrition Problem: Inadequate oral intake Etiology: inability to eat Signs/Symptoms: NPO status Diet Order            Diet regular Room service appropriate? Yes; Fluid consistency: Thin  Diet effective now                 DVT  prophylaxis: heparin injection 5,000 Units Start: 06/25/19 2200   Antimicrobials:  IV cefepime, oral Zyvox Fluid: None  Consultants: Critical care, ID Family Communication: called and updated patient's wife Mrs.  Winquist.  Status is: Inpatient  Remains inpatient appropriate because:Persistent severe electrolyte disturbances   Dispo:  Patient From: Home  Planned Disposition: To be determined  Expected discharge date: 07/01/19  Medically stable for discharge: No   Infusions:  . ceFEPime (MAXIPIME) IV 2 g (07/04/19 0233)    Scheduled Meds: . amLODipine  5 mg Oral Daily  . Chlorhexidine Gluconate Cloth  6 each Topical Daily  . docusate sodium  100 mg Oral BID  . famotidine  20 mg Oral BID  . folic acid  1 mg Oral Daily  . gabapentin  300 mg Oral BID  . heparin  5,000 Units Subcutaneous Q8H  . linezolid  600 mg Oral Q12H  . mouth rinse  15 mL Mouth Rinse BID  . metoprolol succinate  50 mg Oral Daily  . multivitamin with minerals  1 tablet Oral Daily  . polyethylene glycol  17 g Oral Daily  . thiamine  100 mg Oral Daily    Antimicrobials: Anti-infectives (From admission, onward)   Start     Dose/Rate Route Frequency Ordered Stop   07/03/19 2200  linezolid (ZYVOX) tablet 600 mg     Discontinue     600 mg Oral Every 12 hours 07/03/19 1245     07/01/19 2000  linezolid (ZYVOX) IVPB 600 mg  Status:  Discontinued        600 mg 300 mL/hr over 60 Minutes Intravenous Every 12 hours 07/01/19 1709 07/03/19 1245   07/01/19 1800  ceFEPIme (MAXIPIME) 2 g in sodium chloride 0.9 % 100 mL IVPB     Discontinue     2 g 200 mL/hr over 30 Minutes Intravenous Every 8 hours 07/01/19 1714     07/01/19 1200  ampicillin (OMNIPEN) 2 g in sodium chloride 0.9 % 100 mL IVPB  Status:  Discontinued        2 g 300 mL/hr over 20 Minutes Intravenous Every 4 hours 07/01/19 1051 07/01/19 1709   06/29/19 2000  DAPTOmycin (CUBICIN) 700 mg in sodium chloride 0.9 % IVPB  Status:  Discontinued        700 mg 228 mL/hr over 30 Minutes Intravenous Daily 06/29/19 1345 07/01/19 1051   06/28/19 1400  DAPTOmycin (CUBICIN) 600 mg in sodium chloride 0.9 % IVPB  Status:  Discontinued        600 mg 224 mL/hr over 30 Minutes  Intravenous Daily 06/28/19 1308 06/29/19 1345   06/26/19 2200  vancomycin (VANCOREADY) IVPB 750 mg/150 mL  Status:  Discontinued        750 mg 150 mL/hr over 60 Minutes Intravenous Every 12 hours 06/26/19 0913 06/26/19 0944   06/26/19 1200  ampicillin (OMNIPEN) 2 g in sodium chloride 0.9 % 100 mL IVPB  Status:  Discontinued        2 g 300 mL/hr over 20 Minutes Intravenous Every 6 hours 06/26/19 0902 06/26/19 0944   06/26/19 1200  ampicillin (OMNIPEN) 2 g in sodium chloride 0.9 % 100 mL IVPB  Status:  Discontinued        2 g 300 mL/hr over 20 Minutes Intravenous Every 4 hours 06/26/19 0948 06/28/19 1245   06/26/19 0945  ampicillin (OMNIPEN) 2 g in sodium chloride 0.9 % 100 mL IVPB  Status:  Discontinued        2 g  300 mL/hr over 20 Minutes Intravenous Every 4 hours 06/26/19 0944 06/26/19 0948   06/26/19 0915  vancomycin (VANCOREADY) IVPB 2000 mg/400 mL  Status:  Discontinued        2,000 mg 200 mL/hr over 120 Minutes Intravenous NOW 06/26/19 0912 06/26/19 0944   06/25/19 1300  cefTRIAXone (ROCEPHIN) 1 g in sodium chloride 0.9 % 100 mL IVPB        1 g 200 mL/hr over 30 Minutes Intravenous  Once 06/25/19 1246 06/25/19 1520      PRN meds: acetaminophen, ALPRAZolam, fentaNYL (SUBLIMAZE) injection, fentaNYL (SUBLIMAZE) injection, ipratropium-albuterol, midazolam, midazolam, ondansetron **OR** ondansetron (ZOFRAN) IV, phenol, sodium chloride flush, traMADol   Objective: Vitals:   07/04/19 0609 07/04/19 0637  BP: (!) 150/88   Pulse: 67   Resp: (!) 27 20  Temp: 98.4 F (36.9 C)   SpO2: 97%     Intake/Output Summary (Last 24 hours) at 07/04/2019 0947 Last data filed at 07/04/2019 0600 Gross per 24 hour  Intake 979.1 ml  Output 1625 ml  Net -645.9 ml   Filed Weights   06/30/19 0445 07/01/19 0421 07/04/19 0500  Weight: 95.8 kg 95.2 kg 98 kg   Weight change:  Body mass index is 30.13 kg/m.   Physical Exam: General exam: Appears calm and comfortable.  Not in physical distress Skin:  No rashes, lesions or ulcers. HEENT: Atraumatic, normocephalic, supple neck, no obvious bleeding Lungs: Clear to auscultation bilaterally CVS: Regular rate and rhythm, no murmur GI/Abd soft, nontender, nondistended, bowel sound present CNS: Alert, awake, oriented x3 Psychiatry: Mood appropriate Extremities: No pedal edema, no calf tenderness  Data Review: I have personally reviewed the laboratory data and studies available.  Recent Labs  Lab 06/28/19 0517 06/29/19 0509 06/30/19 0440 07/01/19 0316 07/01/19 0738 07/02/19 0901 07/03/19 0633  WBC 5.2   < > 6.7 6.6 5.2 5.1 6.6  NEUTROABS 3.6  --   --   --  3.9 3.1 3.8  HGB 8.7*   < > 8.6* 9.4* 9.9* 8.0* 8.0*  HCT 26.5*   < > 27.2* 30.6* 32.2* 24.9* 24.0*  MCV 103.9*   < > 108.4* 110.9* 112.2* 105.1* 103.9*  PLT 118*   < > 173 304 339 372 396   < > = values in this interval not displayed.   Recent Labs  Lab 07/01/19 0316 07/01/19 0738 07/01/19 1509 07/02/19 0901 07/02/19 1735 07/03/19 0633  NA 141 139 142 143  --  140  K 4.0 5.3* 3.4* 3.0*  --  3.0*  CL 101 99 102 103  --  100  CO2 35* _0 --  30  GLUCOSE 133* 179* 115* 202*  --  106*  BUN 22 22 27* 24*  --  18  CREATININE 1.04 1.35* 1.24 0.96  --  1.06  CALCIUM 8.7* 8.7* 8.6* 8.6*  --  8.4*  MG  --  1.7 1.6* 1.5* 1.9 1.6*  PHOS  --  5.4* <1.0* <1.0* 1.2*  --    Signed, Terrilee Croak, MD [facility] Pager: [phone] (Secure Chat preferred). 07/04/2019

## 2019-07-05 DIAGNOSIS — N170 Acute kidney failure with tubular necrosis: Secondary | ICD-10-CM

## 2019-07-05 LAB — BASIC METABOLIC PANEL
Anion gap: 8 (ref 5–15)
BUN: 12 mg/dL (ref 8–23)
CO2: 30 mmol/L (ref 22–32)
Calcium: 8.4 mg/dL — ABNORMAL LOW (ref 8.9–10.3)
Chloride: 99 mmol/L (ref 98–111)
Creatinine, Ser: 0.87 mg/dL (ref 0.61–1.24)
GFR calc Af Amer: >60 mL/min (ref >60)
GFR calc non Af Amer: >60 mL/min (ref >60)
Glucose, Bld: 98 mg/dL (ref 70–99)
Potassium: 3.6 mmol/L (ref 3.5–5.1)
Sodium: 137 mmol/L (ref 135–145)

## 2019-07-05 LAB — PHOSPHORUS: Phosphorus: 2.9 mg/dL (ref 2.5–4.6)

## 2019-07-05 LAB — MAGNESIUM: Magnesium: 1.6 mg/dL — ABNORMAL LOW (ref 1.7–2.4)

## 2019-07-05 LAB — FERRITIN: Ferritin: 1345 ng/mL — ABNORMAL HIGH (ref 24–336)

## 2019-07-05 MED ORDER — PRO-STAT SUGAR FREE PO LIQD
30.0000 mL | Freq: Two times a day (BID) | ORAL | Status: DC
  Administered 2019-07-05 – 2019-07-07 (×5): 30 mL via ORAL
  Filled 2019-07-05 (×5): qty 30

## 2019-07-05 MED ORDER — MAGNESIUM SULFATE 2 GM/50ML IV SOLN
2.0000 g | Freq: Once | INTRAVENOUS | Status: AC
  Administered 2019-07-05: 2 g via INTRAVENOUS
  Filled 2019-07-05: qty 50

## 2019-07-05 NOTE — Plan of Care (Signed)
  Problem: Health Behavior/Discharge Planning: Goal: Ability to manage health-related needs will improve Outcome: Progressing   Problem: Clinical Measurements: Goal: Ability to maintain clinical measurements within normal limits will improve Outcome: Progressing Goal: Will remain free from infection Outcome: Progressing Goal: Diagnostic test results will improve Outcome: Progressing Goal: Respiratory complications will improve Outcome: Progressing   Problem: Activity: Goal: Risk for activity intolerance will decrease Outcome: Progressing   Problem: Nutrition: Goal: Adequate nutrition will be maintained Outcome: Progressing   Problem: Coping: Goal: Level of anxiety will decrease Outcome: Progressing   Problem: Skin Integrity: Goal: Risk for impaired skin integrity will decrease Outcome: Progressing

## 2019-07-05 NOTE — Progress Notes (Signed)
Occupational Therapy Treatment Patient Details Name: Louis [name] MRN: [id number] DOB: 04/16/1955 Today's Date: 07/05/2019    History of present illness Louis [name] is a 64 y.o. male with a history of scoliosis with restrictive and obstructive lung disease, chronic HFpEF, OSA and nocturnal hypoxia on CPAP and 2L O2, essential tremor, hepatic steatosis, and perforated duodenal ulcer s/p [name] Jan 2021 who presented to the ED with increasing falls and progressive weakness. He was found to be febrile to 101.33F and tachypneic though not hypoxemic. Work up included CXR with indistinct hazy left-sided opacity and subsequent CTA chest after d-dimer was 1.90 which had motion degradation without PE or focal consolidation. There were incidental findings including renal lesions for which abdominal MR was recommended.   OT comments  Patient continues to demonstrate improving. Today patient able to ambulate to bathroom with min guard and Rw, stand at sink to perform grooming task, perform lower body dressing with mod assist, perform toilet transfer with min guard and perform chair exercises. Patient did need sitting rest break after standing - with o2 sats 02% and Hr in to the 120s. Continue to recommend aggressive therapy at discharge to return patient to independence and profession.   Follow Up Recommendations  CIR;Supervision/Assistance - 24 hour    Equipment Recommendations  None recommended by OT    Recommendations for Other Services      Precautions / Restrictions Precautions Precautions: Fall Restrictions Weight Bearing Restrictions: No Other Position/Activity Restrictions: per chart: WBAT in shoes with carbon plate inserts per wife       Mobility Bed Mobility               General bed mobility comments: Patient seated in recliner when therapist entered the room.  Transfers Overall transfer level: Needs assistance Equipment used: Rolling walker (2 wheeled) Transfers: Sit  to/from Stand Sit to Stand: Min guard Stand pivot transfers: Min guard       General transfer comment: Min guard to stand from recliner. Min guard to ambulate to bathroom with RW. min guard to stand the sink. Min guard for toilet transfer. Min guard to return to reclilner. Patient reports decreased endurance and did require sitting break after standing at sink.    Balance Overall balance assessment: Mild deficits observed, not formally tested Sitting-balance support: No upper extremity supported;Feet supported Sitting balance-Leahy Scale: Good     Standing balance support: During functional activity;Bilateral upper extremity supported Standing balance-Leahy Scale: Fair                             ADL either performed or assessed with clinical judgement   ADL       Grooming: Wash/dry face;Oral care Grooming Details (indicate cue type and reason): Patient stood at sink to perform grooming task after ambulating to the bathroom with RW. Patient performed task quickly and unsure of quickly. Patient counters with staying he is always quick with brushing his teeth. Patient required sitting rest break on toilet after approx 2 min standing task. Hr in 120s and o2 sat 92%. [name]             Lower Body Dressing: Set up;Moderate assistance Lower Body Dressing Details (indicate cue type and reason): Mod assist to donn socks and shoes. Patient able to donn right socks. Therapist donned left sock and shoe. Patient able to to donn right shoe and sock. Therapist tied laces. Toilet Transfer: Min guard;RW;Ambulation;Grab bars  Vision       Perception     Praxis      Cognition Arousal/Alertness: Awake/alert Behavior During Therapy: WFL for tasks assessed/performed Overall Cognitive Status: Within Functional Limits for tasks assessed                                          Exercises Other Exercises Other Exercises: Chair Push Upx x  5, 3 sets with 3 second hold; educated to perform 3 x a day Other Exercises: Shoulder Flexion (reaching high) x 20 reps; educated to perform 3 x a day Other Exercises: Hip Flexion x 10 each hip; educated to perform 3 x a day   Shoulder Instructions       General Comments      Pertinent Vitals/ Pain       Pain Assessment: No/denies pain  Home Living                                          Prior Functioning/Environment              Frequency  Min 2X/week        Progress Toward Goals  OT Goals(current goals can now be found in the care plan section)  Progress towards OT goals: Progressing toward goals  Acute Rehab OT Goals Patient Stated Goal: I need to improve my endurance OT Goal Formulation: With patient Time For Goal Achievement: 07/09/19 Potential to Achieve Goals: Good  Plan Discharge plan remains appropriate    Co-evaluation          OT goals addressed during session: ADL's and self-care;Strengthening/ROM      AM-PAC OT "6 Clicks" Daily Activity     Outcome Measure   Help from another person eating meals?: None Help from another person taking care of personal grooming?: A Little Help from another person toileting, which includes using toliet, bedpan, or urinal?: A Little Help from another person bathing (including washing, rinsing, drying)?: A Little Help from another person to put on and taking off regular upper body clothing?: A Little Help from another person to put on and taking off regular lower body clothing?: A Lot 6 Click Score: 18    End of Session Equipment Utilized During Treatment: Gait belt;Rolling walker  OT Visit Diagnosis: Repeated falls (R29.6);Other abnormalities of gait and mobility (R26.89);Muscle weakness (generalized) (M62.81);Other symptoms and signs involving cognitive function;Pain   Activity Tolerance Patient limited by fatigue   Patient Left in chair;with call bell/phone within reach;with chair  alarm set   Nurse Communication  (okay to see per Nursing)        Time: 7858-8502 OT Time Calculation (min): 29 min  Charges: OT General Charges $OT Visit: 1 Visit OT Treatments $Self Care/Home Management : 8-22 mins $Therapeutic Exercise: 8-22 mins  Derl Barrow, OTR/L [place]  Office [phone] Pager: [place] 07/05/2019, 1:34 PM

## 2019-07-05 NOTE — Progress Notes (Signed)
PROGRESS NOTE  Louis [name]  DOB: 11-04-1955  PCP: Rutherford Guys, MD ZDG:[id number]  DOA: 06/25/2019  LOS: 10 days   Chief Complaint  Patient presents with  . Tremors  . Fall   Brief narrative: 64 year old male with PMH significant for HTN, CHF, COPD, sleep apnea, GERD, BPH, anxiety, essential tremor.   Patient presented to the ED on 6/24 with fever, tachypnea and confusion.   He was admitted to hospitalist service.  Urine culture and blood culture sent on admission grew Enterococcus.  He was followed by infectious disease.  He underwent TEE on 6/28 that did not show any vegetation.  His condition improved with appropriate antibiotic therapy and was to be discharged to inpatient rehab on June 30. However, overnight from 6/29 through 6/30 he had some confusion, tachypnea.  Apparently he did not have his CPAP mask on that night.  By the morning of 6/30 he was unresponsive.  Blood gas shows pH of 7.00 and PCO2 elevated to more than 120.  Patient underwent intubation and was transferred to ICU. 6/30, underwent bronchoscopy 7/1, extubated 7/2, transfer out to hospitalist service again.  Subjective: Patient was seen and examined this morning. Sitting up in chair.  Not in distress.  No new symptoms. Called patient's wife from his room during the evaluation.  Chart reviewed. No fever in last 24 hours, heart rate 60s to 80s. Blood pressure in normal range. Breathing in room air. Labs from this morning with normal sodium, potassium, creatinine, phosphorus. Magnesium level remains low at 1.6. Ferritin level down to 1300.  Assessment/Plan: Enterococcal bacteremia and UTI -Currently on linezolid with a plan to treat for 14 days (6/28-7/11).  Healthcare associated pneumonia Acute hypoxemic hypercapnic respiratory failure History of COPD, scoliosis -Patient had hypercapnic respiratory arrest on 6/30. -Underwent bronchoscopy on 6/30 -Bronchoalveolar lavage showed normal flora only. ID  consult appreciated. IV cefepime stopped yesterday on 7/3.   Acute toxic and metabolic encephalopathy  -Likely due to hypercapnia with PCO2 more than 120. -CT head is unrevealing -Appears to have fully resolved  Essential hypertension Chronic heart failure with preserved ejection fraction -Home meds include Norvasc 5 mg daily, losartan 100 mg daily, Toprol 150 mg daily, Aldactone 25 mg daily, -Currently on reduced dose of metoprolol (due to bradycardia), reduced dose of losartan 50 mg daily and Norvasc at 5 mg daily. -Blood pressure mostly in normal range. Continue to hold Aldactone. Continue to monitor.   Chronic macrocytic anemia Vitamin B12 deficiency - hemoglobin has been running mostly between 9-10 since Jan 2021, persistent macrocytosis. -Patient reports recent EGD and colonoscopy by Dr. [name], reports negative for any significant findings. -Vitamin B12 level 237 on 6/24.  Started on supplement. -Ferritin level >2300 on 6/24. Repeat level today is lower at 1300. -No active bleeding. Continue to monitor hemoglobin.  Hypokalemia/hypomagnesemia/hypophosphatemia -Improving levels with replacement. Labs today with magnesium level still low at 1.6. Ordered for IV replacement.  -Recheck tomorrow.  AKI on CKD 2 -Creatinine was elevated at 1.99 on 6/24. -Improved to normal now.  Essential tremor -On beta-blocker.  Hepatic steatosis with history of alcohol abuse -AST/ALT currently normal range.  History of obstructive sleep apnea -Nightly CPAP use  Mobility: Encourage ambulation.  PT eval obtained.  CIR recommended. Code Status:   Code Status: Full Code  Nutritional status: Body mass index is 30.13 kg/m. Nutrition Problem: Inadequate oral intake Etiology: inability to eat Signs/Symptoms: NPO status Diet Order            Diet  regular Room service appropriate? Yes; Fluid consistency: Thin  Diet effective now                 DVT prophylaxis: heparin injection 5,000  Units Start: 06/25/19 2200   Antimicrobials:  oral Zyvox Fluid: None  Consultants: Critical care, ID Family Communication:  Discussed with patient's wife on phone  Status is: Inpatient  Remains inpatient appropriate because pending placement to rehab  Dispo:  Patient From: Home  Planned Disposition: Likely CIR  expected discharge date: When bed is available/insurance authorization is obtained.  Medically stable for discharge: Yes  Infusions:    Scheduled Meds: . amLODipine  5 mg Oral Daily  . Chlorhexidine Gluconate Cloth  6 each Topical Daily  . docusate sodium  100 mg Oral BID  . famotidine  20 mg Oral BID  . feeding supplement (PRO-STAT SUGAR FREE 64)  30 mL Oral BID  . folic acid  1 mg Oral Daily  . gabapentin  300 mg Oral BID  . heparin  5,000 Units Subcutaneous Q8H  . linezolid  600 mg Oral Q12H  . losartan  50 mg Oral Daily  . mouth rinse  15 mL Mouth Rinse BID  . metoprolol succinate  50 mg Oral Daily  . multivitamin with minerals  1 tablet Oral Daily  . thiamine  100 mg Oral Daily  . vitamin B-12  1,000 mcg Oral Daily    Antimicrobials: Anti-infectives (From admission, onward)   Start     Dose/Rate Route Frequency Ordered Stop   07/03/19 2200  linezolid (ZYVOX) tablet 600 mg     Discontinue     600 mg Oral Every 12 hours 07/03/19 1245     07/01/19 2000  linezolid (ZYVOX) IVPB 600 mg  Status:  Discontinued        600 mg 300 mL/hr over 60 Minutes Intravenous Every 12 hours 07/01/19 1709 07/03/19 1245   07/01/19 1800  ceFEPIme (MAXIPIME) 2 g in sodium chloride 0.9 % 100 mL IVPB  Status:  Discontinued        2 g 200 mL/hr over 30 Minutes Intravenous Every 8 hours 07/01/19 1714 07/04/19 1348   07/01/19 1200  ampicillin (OMNIPEN) 2 g in sodium chloride 0.9 % 100 mL IVPB  Status:  Discontinued        2 g 300 mL/hr over 20 Minutes Intravenous Every 4 hours 07/01/19 1051 07/01/19 1709   06/29/19 2000  DAPTOmycin (CUBICIN) 700 mg in sodium chloride 0.9 % IVPB   Status:  Discontinued        700 mg 228 mL/hr over 30 Minutes Intravenous Daily 06/29/19 1345 07/01/19 1051   06/28/19 1400  DAPTOmycin (CUBICIN) 600 mg in sodium chloride 0.9 % IVPB  Status:  Discontinued        600 mg 224 mL/hr over 30 Minutes Intravenous Daily 06/28/19 1308 06/29/19 1345   06/26/19 2200  vancomycin (VANCOREADY) IVPB 750 mg/150 mL  Status:  Discontinued        750 mg 150 mL/hr over 60 Minutes Intravenous Every 12 hours 06/26/19 0913 06/26/19 0944   06/26/19 1200  ampicillin (OMNIPEN) 2 g in sodium chloride 0.9 % 100 mL IVPB  Status:  Discontinued        2 g 300 mL/hr over 20 Minutes Intravenous Every 6 hours 06/26/19 0902 06/26/19 0944   06/26/19 1200  ampicillin (OMNIPEN) 2 g in sodium chloride 0.9 % 100 mL IVPB  Status:  Discontinued  2 g 300 mL/hr over 20 Minutes Intravenous Every 4 hours 06/26/19 0948 06/28/19 1245   06/26/19 0945  ampicillin (OMNIPEN) 2 g in sodium chloride 0.9 % 100 mL IVPB  Status:  Discontinued        2 g 300 mL/hr over 20 Minutes Intravenous Every 4 hours 06/26/19 0944 06/26/19 0948   06/26/19 0915  vancomycin (VANCOREADY) IVPB 2000 mg/400 mL  Status:  Discontinued        2,000 mg 200 mL/hr over 120 Minutes Intravenous NOW 06/26/19 0912 06/26/19 0944   06/25/19 1300  cefTRIAXone (ROCEPHIN) 1 g in sodium chloride 0.9 % 100 mL IVPB        1 g 200 mL/hr over 30 Minutes Intravenous  Once 06/25/19 1246 06/25/19 1520      PRN meds: acetaminophen, ALPRAZolam, fentaNYL (SUBLIMAZE) injection, fentaNYL (SUBLIMAZE) injection, ipratropium-albuterol, midazolam, midazolam, ondansetron **OR** ondansetron (ZOFRAN) IV, phenol, sodium chloride flush, traMADol   Objective: Vitals:   07/05/19 0440 07/05/19 1328  BP: 140/76 133/78  Pulse: 66 75  Resp: 19 15  Temp: 98.8 F (37.1 C) 97.8 F (36.6 C)  SpO2: 98% 95%    Intake/Output Summary (Last 24 hours) at 07/05/2019 1532 Last data filed at 07/05/2019 1322 Gross per 24 hour  Intake 460 ml  Output  1175 ml  Net -715 ml   Filed Weights   06/30/19 0445 07/01/19 0421 07/04/19 0500  Weight: 95.8 kg 95.2 kg 98 kg   Weight change:  Body mass index is 30.13 kg/m.   Physical Exam: General exam: Appears calm and comfortable.  Not in physical distress Skin: No rashes, lesions or ulcers. HEENT: Atraumatic, normocephalic, supple neck, no obvious bleeding Lungs: Clear to auscultation bilaterally.  No crackles or wheezing. CVS: Regular rate and rhythm, no murmur GI/Abd soft, nontender, nondistended, bowel sound present CNS: Alert, awake, oriented x3 Psychiatry: Mood appropriate Extremities: No pedal edema, no calf tenderness  Data Review: I have personally reviewed the laboratory data and studies available.  Recent Labs  Lab 07/01/19 0316 07/01/19 0738 07/02/19 0901 07/03/19 0633 07/04/19 1226  WBC 6.6 5.2 5.1 6.6 5.8  NEUTROABS  --  3.9 3.1 3.8 3.4  HGB 9.4* 9.9* 8.0* 8.0* 8.4*  HCT 30.6* 32.2* 24.9* 24.0* 25.6*  MCV 110.9* 112.2* 105.1* 103.9* 104.1*  PLT 304 339 372 396 450*   Recent Labs  Lab 07/01/19 1509 07/01/19 1509 07/02/19 0901 07/02/19 1735 07/03/19 0633 07/04/19 1226 07/05/19 0400  NA 142  --  143  --  140 138 137  K 3.4*  --  3.0*  --  3.0* 3.4* 3.6  CL 102  --  103  --  100 99 99  CO2 31  --  29  --  _0 GLUCOSE 115*  --  202*  --  106* 130* 98  BUN 27*  --  24*  --  _1 CREATININE 1.24  --  0.96  --  1.06 0.92 0.87  CALCIUM 8.6*  --  8.6*  --  8.4* 8.8* 8.4*  MG 1.6*   < > 1.5* 1.9 1.6* 1.4* 1.6*  PHOS <1.0*  --  <1.0* 1.2*  --  2.4* 2.9   < > = values in this interval not displayed.   Signed, Terrilee Croak, MD [facility] Pager: [phone] (Secure Chat preferred). 07/05/2019

## 2019-07-06 ENCOUNTER — Encounter: Payer: Self-pay | Admitting: Orthopedic Surgery

## 2019-07-06 ENCOUNTER — Inpatient Hospital Stay (HOSPITAL_COMMUNITY): Payer: 59

## 2019-07-06 DIAGNOSIS — J9601 Acute respiratory failure with hypoxia: Secondary | ICD-10-CM

## 2019-07-06 DIAGNOSIS — J9811 Atelectasis: Secondary | ICD-10-CM

## 2019-07-06 LAB — BASIC METABOLIC PANEL
Anion gap: 8 (ref 5–15)
BUN: 14 mg/dL (ref 8–23)
CO2: 28 mmol/L (ref 22–32)
Calcium: 8.8 mg/dL — ABNORMAL LOW (ref 8.9–10.3)
Chloride: 98 mmol/L (ref 98–111)
Creatinine, Ser: 0.86 mg/dL (ref 0.61–1.24)
GFR calc Af Amer: >60 mL/min (ref >60)
GFR calc non Af Amer: >60 mL/min (ref >60)
Glucose, Bld: 103 mg/dL — ABNORMAL HIGH (ref 70–99)
Potassium: 3.7 mmol/L (ref 3.5–5.1)
Sodium: 134 mmol/L — ABNORMAL LOW (ref 135–145)

## 2019-07-06 LAB — CBC WITH DIFFERENTIAL/PLATELET
Abs Immature Granulocytes: 0.11 10*3/uL — ABNORMAL HIGH (ref 0.00–0.07)
Basophils Absolute: 0 10*3/uL (ref 0.0–0.1)
Basophils Relative: 0 %
Eosinophils Absolute: 0.2 10*3/uL (ref 0.0–0.5)
Eosinophils Relative: 4 %
HCT: 23.9 % — ABNORMAL LOW (ref 39.0–52.0)
Hemoglobin: 7.7 g/dL — ABNORMAL LOW (ref 13.0–17.0)
Immature Granulocytes: 2 %
Lymphocytes Relative: 23 %
Lymphs Abs: 1.3 10*3/uL (ref 0.7–4.0)
MCH: 33.8 pg (ref 26.0–34.0)
MCHC: 32.2 g/dL (ref 30.0–36.0)
MCV: 104.8 fL — ABNORMAL HIGH (ref 80.0–100.0)
Monocytes Absolute: 0.9 10*3/uL (ref 0.1–1.0)
Monocytes Relative: 16 %
Neutro Abs: 3.2 10*3/uL (ref 1.7–7.7)
Neutrophils Relative %: 55 %
Platelets: 447 10*3/uL — ABNORMAL HIGH (ref 150–400)
RBC: 2.28 MIL/uL — ABNORMAL LOW (ref 4.22–5.81)
RDW: 14.1 % (ref 11.5–15.5)
WBC: 5.8 10*3/uL (ref 4.0–10.5)
nRBC: 0 % (ref 0.0–0.2)

## 2019-07-06 LAB — MAGNESIUM: Magnesium: 1.6 mg/dL — ABNORMAL LOW (ref 1.7–2.4)

## 2019-07-06 LAB — PREPARE RBC (CROSSMATCH)

## 2019-07-06 LAB — ABO/RH: ABO/RH(D): O POS

## 2019-07-06 LAB — PHOSPHORUS: Phosphorus: 3.5 mg/dL (ref 2.5–4.6)

## 2019-07-06 MED ORDER — MAGNESIUM OXIDE 400 (241.3 MG) MG PO TABS
400.0000 mg | ORAL_TABLET | Freq: Two times a day (BID) | ORAL | Status: DC
  Administered 2019-07-06 – 2019-07-07 (×3): 400 mg via ORAL
  Filled 2019-07-06 (×3): qty 1

## 2019-07-06 MED ORDER — MAGNESIUM SULFATE 2 GM/50ML IV SOLN
2.0000 g | Freq: Once | INTRAVENOUS | Status: AC
  Administered 2019-07-06: 2 g via INTRAVENOUS
  Filled 2019-07-06: qty 50

## 2019-07-06 MED ORDER — SODIUM CHLORIDE 0.9% IV SOLUTION: Freq: Once | INTRAVENOUS | Status: AC

## 2019-07-06 NOTE — Progress Notes (Signed)
Physical Therapy Treatment Patient Details Name: Louis [name] MRN: [id number] DOB: 1955-10-07 Today's Date: 07/06/2019    History of Present Illness Louis [name] is a 64 y.o. male with a history of scoliosis with restrictive and obstructive lung disease, chronic HFpEF, OSA and nocturnal hypoxia on CPAP and 2L O2, essential tremor, hepatic steatosis, and perforated duodenal ulcer s/p [name] Jan 2021 who presented to the ED with increasing falls and progressive weakness. He was found to be febrile to 101.28F and tachypneic though not hypoxemic. Work up included CXR with indistinct hazy left-sided opacity and subsequent CTA chest after d-dimer was 1.90 which had motion degradation without PE or focal consolidation. There were incidental findings including renal lesions for which abdominal MR was recommended.    PT Comments    Patient remains highly motivated and continues to progress well with acute PT. Patient required fewer cues for safe technique with transfers this session and increased gait distance to 2x 150' this date with seated rest between. Louis [name] continues to be mildly unsteady with slight tremor in all four extremities however step length and symmetrical step pattern has improved. Patient will continue to benefit from skilled PT interventions to address impairments and progress towards PLOF. Continue to recommend intense therapy follow up at CIR level.   Follow Up Recommendations  CIR     Equipment Recommendations  Rolling walker with 5" wheels    Recommendations for Other Services       Precautions / Restrictions Precautions Precautions: Fall Precaution Comments: watch O2 sats Restrictions Weight Bearing Restrictions: No Other Position/Activity Restrictions: per chart: WBAT in shoes with carbon plate inserts per wife    Mobility  Bed Mobility          General bed mobility comments: pt OOB in recliner at start of session and ended in recliner  Transfers Overall  transfer level: Needs assistance Equipment used: Rolling walker (2 wheeled) Transfers: Sit to/from [facility] Sit to Stand: Min assist;Min guard Stand pivot transfers: Min assist       General transfer comment: pt required cues for safe hand placement/technique with RW on first sti<>stand and demonstrated good carryover with technique during session. pt performed stand step in bathroom with use of grab bar for controlled lower and rising.   Ambulation/Gait Ambulation/Gait assistance: +2 safety/equipment;Min assist Gait Distance (Feet): 140 Feet (2x) Assistive device: Rolling walker (2 wheeled) Gait Pattern/deviations: Step-to pattern;Decreased stride length;Shuffle;Trunk flexed Gait velocity: decreased   General Gait Details: Min assist with ceus for safe step pattern/proximity to RW, and intermittent assist for walker management. chair follow for safety. pt HR elevated to 132 bpm max and SpO2 maintained between 92-95% on RA with gait. Pt has mild tremor noted in all extremities with mobility. Seated rest break halfway and pt able to ambulate bcak to room after ~3 mins rest.   Stairs          Wheelchair Mobility    Modified Rankin (Stroke Patients Only)       Balance Overall balance assessment: Needs assistance Sitting-balance support: Feet supported Sitting balance-Leahy Scale: Good Sitting balance - Comments: pt able to don socks and shoes while sitting in recliner. pt able to tie shoes with some set up assist.   Standing balance support: During functional activity;Bilateral upper extremity supported Standing balance-Leahy Scale: Fair               Cognition Arousal/Alertness: Awake/alert Behavior During Therapy: WFL for tasks assessed/performed Overall Cognitive Status: Within Functional Limits  for tasks assessed           Exercises      General Comments        Pertinent Vitals/Pain Pain Assessment: No/denies pain           PT  Goals (current goals can now be found in the care plan section) Acute Rehab PT Goals Patient Stated Goal: I need to improve my endurance PT Goal Formulation: With patient Time For Goal Achievement: 07/10/19 Potential to Achieve Goals: Good    Frequency    Min 3X/week      PT Plan Current plan remains appropriate       AM-PAC PT "6 Clicks" Mobility   Outcome Measure  Help needed turning from your back to your side while in a flat bed without using bedrails?: A Lot Help needed moving from lying on your back to sitting on the side of a flat bed without using bedrails?: A Lot Help needed moving to and from a bed to a chair (including a wheelchair)?: A Lot Help needed standing up from a chair using your arms (e.g., wheelchair or bedside chair)?: A Little Help needed to walk in hospital room?: A Lot Help needed climbing 3-5 steps with a railing? : Total 6 Click Score: 12    End of Session Equipment Utilized During Treatment: Gait belt Activity Tolerance: Patient tolerated treatment well Patient left: in chair;with call bell/phone within reach;with family/visitor present Nurse Communication: Mobility status PT Visit Diagnosis: Other abnormalities of gait and mobility (R26.89)     Time: 7253-6644 PT Time Calculation (min) (ACUTE ONLY): 23 min  Charges:  $Gait Training: 8-22 mins                     Verner Mould, DPT Acute Rehabilitation Services  Office [phone] Pager [phone]  07/06/2019 1:54 PM

## 2019-07-06 NOTE — Progress Notes (Signed)
Subjective: No new complaints   Antibiotics:  Anti-infectives (From admission, onward)   Start     Dose/Rate Route Frequency Ordered Stop   07/03/19 2200  linezolid (ZYVOX) tablet 600 mg     Discontinue     600 mg Oral Every 12 hours 07/03/19 1245     07/01/19 2000  linezolid (ZYVOX) IVPB 600 mg  Status:  Discontinued        600 mg 300 mL/hr over 60 Minutes Intravenous Every 12 hours 07/01/19 1709 07/03/19 1245   07/01/19 1800  ceFEPIme (MAXIPIME) 2 g in sodium chloride 0.9 % 100 mL IVPB  Status:  Discontinued        2 g 200 mL/hr over 30 Minutes Intravenous Every 8 hours 07/01/19 1714 07/04/19 1348   07/01/19 1200  ampicillin (OMNIPEN) 2 g in sodium chloride 0.9 % 100 mL IVPB  Status:  Discontinued        2 g 300 mL/hr over 20 Minutes Intravenous Every 4 hours 07/01/19 1051 07/01/19 1709   06/29/19 2000  DAPTOmycin (CUBICIN) 700 mg in sodium chloride 0.9 % IVPB  Status:  Discontinued        700 mg 228 mL/hr over 30 Minutes Intravenous Daily 06/29/19 1345 07/01/19 1051   06/28/19 1400  DAPTOmycin (CUBICIN) 600 mg in sodium chloride 0.9 % IVPB  Status:  Discontinued        600 mg 224 mL/hr over 30 Minutes Intravenous Daily 06/28/19 1308 06/29/19 1345   06/26/19 2200  vancomycin (VANCOREADY) IVPB 750 mg/150 mL  Status:  Discontinued        750 mg 150 mL/hr over 60 Minutes Intravenous Every 12 hours 06/26/19 0913 06/26/19 0944   06/26/19 1200  ampicillin (OMNIPEN) 2 g in sodium chloride 0.9 % 100 mL IVPB  Status:  Discontinued        2 g 300 mL/hr over 20 Minutes Intravenous Every 6 hours 06/26/19 0902 06/26/19 0944   06/26/19 1200  ampicillin (OMNIPEN) 2 g in sodium chloride 0.9 % 100 mL IVPB  Status:  Discontinued        2 g 300 mL/hr over 20 Minutes Intravenous Every 4 hours 06/26/19 0948 06/28/19 1245   06/26/19 0945  ampicillin (OMNIPEN) 2 g in sodium chloride 0.9 % 100 mL IVPB  Status:  Discontinued        2 g 300 mL/hr over 20 Minutes Intravenous Every 4 hours  06/26/19 0944 06/26/19 0948   06/26/19 0915  vancomycin (VANCOREADY) IVPB 2000 mg/400 mL  Status:  Discontinued        2,000 mg 200 mL/hr over 120 Minutes Intravenous NOW 06/26/19 0912 06/26/19 0944   06/25/19 1300  cefTRIAXone (ROCEPHIN) 1 g in sodium chloride 0.9 % 100 mL IVPB        1 g 200 mL/hr over 30 Minutes Intravenous  Once 06/25/19 1246 06/25/19 1520      Medications: Scheduled Meds: . sodium chloride   Intravenous Once  . amLODipine  5 mg Oral Daily  . Chlorhexidine Gluconate Cloth  6 each Topical Daily  . docusate sodium  100 mg Oral BID  . famotidine  20 mg Oral BID  . feeding supplement (PRO-STAT SUGAR FREE 64)  30 mL Oral BID  . folic acid  1 mg Oral Daily  . gabapentin  300 mg Oral BID  . heparin  5,000 Units Subcutaneous Q8H  . linezolid  600 mg Oral Q12H  . losartan  50  mg Oral Daily  . magnesium oxide  400 mg Oral BID  . mouth rinse  15 mL Mouth Rinse BID  . metoprolol succinate  50 mg Oral Daily  . multivitamin with minerals  1 tablet Oral Daily  . thiamine  100 mg Oral Daily  . vitamin B-12  1,000 mcg Oral Daily   Continuous Infusions: PRN Meds:.acetaminophen, ALPRAZolam, fentaNYL (SUBLIMAZE) injection, fentaNYL (SUBLIMAZE) injection, ipratropium-albuterol, midazolam, midazolam, ondansetron **OR** ondansetron (ZOFRAN) IV, phenol, sodium chloride flush, traMADol    Objective: Weight change:   Intake/Output Summary (Last 24 hours) at 07/06/2019 1528 Last data filed at 07/06/2019 1400 Gross per 24 hour  Intake 1560 ml  Output 1425 ml  Net 135 ml   Blood pressure 121/66, pulse 84, temperature 98.1 F (36.7 C), temperature source Oral, resp. rate 16, height _0  (1.803 m), weight 95.6 kg, SpO2 93 %. Temp:  [98.1 F (36.7 C)-98.4 F (36.9 C)] 98.1 F (36.7 C) (07/05 1411) Pulse Rate:  [73-84] 84 (07/05 1411) Resp:  [16-20] 16 (07/05 1411) BP: (121-144)/(66-86) 121/66 (07/05 1411) SpO2:  [93 %-97 %] 93 % (07/05 1411) Weight:  [95.6 kg] 95.6 kg (07/05  0500)  Physical Exam: General: Alert and awake, oriented x3, not in any acute distress. HEENT: anicteric sclera, EOMI CVS regular rate, normal no mgr Chest: , no wheezing, no respiratory distress, lungs fairly clearly Abdomen: soft non-distended,  Extremities: no edema or deformity noted bilaterally Skin: no rashes Neuro: nonfocal  CBC:    BMET Recent Labs    07/05/19 0400 07/06/19 0350  NA 137 134*  K 3.6 3.7  CL 99 98  CO2 30 28  GLUCOSE 98 103*  BUN 12 14  CREATININE 0.87 0.86  CALCIUM 8.4* 8.8*     Liver Panel  Recent Labs    07/04/19 1226  PROT 6.7  ALBUMIN 2.9*  AST 39  ALT 35  ALKPHOS 63  BILITOT 0.9       Sedimentation Rate No results for input(s): ESRSEDRATE in the last 72 hours. C-Reactive Protein No results for input(s): CRP in the last 72 hours.  Micro Results: Recent Results (from the past 720 hour(s))  Urine culture     Status: Abnormal   Collection Time: 06/25/19 12:02 PM   Specimen: Urine, Clean Catch  Result Value Ref Range Status   Specimen Description   Final    URINE, CLEAN CATCH Performed at [facility], [place] [address], [place], [place] [address]    Special Requests   Final    NONE Performed at [facility], [place] [address] [place], [place] [address]    Culture >=100,000 COLONIES/mL ENTEROCOCCUS FAECALIS (A)  Final   Report Status 06/28/2019 FINAL  Final   Organism ID, Bacteria ENTEROCOCCUS FAECALIS (A)  Final      Susceptibility   Enterococcus faecalis - MIC*    AMPICILLIN <=2 SENSITIVE Sensitive     NITROFURANTOIN <=16 SENSITIVE Sensitive     VANCOMYCIN 1 SENSITIVE Sensitive     * >=100,000 COLONIES/mL ENTEROCOCCUS FAECALIS  Culture, blood (routine x 2)     Status: Abnormal   Collection Time: 06/25/19  1:08 PM   Specimen: BLOOD  Result Value Ref Range Status   Specimen Description   Final    BLOOD RIGHT ANTECUBITAL Performed at [facility], [place] [address],  [place], [place] [address]    Special Requests   Final    BOTTLES DRAWN AEROBIC AND ANAEROBIC Blood Culture adequate volume  Performed at [facility], [place] [address] [place], [place] [address]    Culture  Setup Time   Final    GRAM POSITIVE COCCI IN BOTH AEROBIC AND ANAEROBIC BOTTLES CRITICAL RESULT CALLED TO, READ BACK BY AND VERIFIED WITH: M. RENZ PHARMD, AT [id number] 06/26/19 BY Rush Landmark Performed at [facility], [place] [address] [place], [place] [address]    Culture ENTEROCOCCUS FAECALIS (A)  Final   Report Status 06/28/2019 FINAL  Final   Organism ID, Bacteria ENTEROCOCCUS FAECALIS  Final      Susceptibility   Enterococcus faecalis - MIC*    AMPICILLIN <=2 SENSITIVE Sensitive     VANCOMYCIN 1 SENSITIVE Sensitive     GENTAMICIN SYNERGY SENSITIVE Sensitive     * ENTEROCOCCUS FAECALIS  SARS Coronavirus 2 by RT PCR (hospital order, performed in [facility] hospital lab) Nasopharyngeal Nasopharyngeal Swab     Status: None   Collection Time: 06/25/19  1:08 PM   Specimen: Nasopharyngeal Swab  Result Value Ref Range Status   SARS Coronavirus 2 NEGATIVE NEGATIVE Final    Comment: (NOTE) SARS-CoV-2 target nucleic acids are NOT DETECTED.  The SARS-CoV-2 RNA is generally detectable in upper and lower respiratory specimens during the acute phase of infection. The lowest concentration of SARS-CoV-2 viral copies this assay can detect is 250 copies / mL. A negative result does not preclude SARS-CoV-2 infection and should not be used as the sole basis for treatment or other patient management decisions.  A negative result may occur with improper specimen collection / handling, submission of specimen other than nasopharyngeal swab, presence of viral mutation(s) within the areas targeted by this assay, and inadequate number of viral copies (<250 copies / mL). A negative result must be combined with clinical observations, patient history, and epidemiological  information.  Fact Sheet for Patients:   StrictlyIdeas.no  Fact Sheet for Healthcare Providers: BankingDealers.co.za  This test is not yet approved or  cleared by the [place] FDA and has been authorized for detection and/or diagnosis of SARS-CoV-2 by FDA under an Emergency Use Authorization (EUA).  This EUA will remain in effect (meaning this test can be used) for the duration of the COVID-19 declaration under Section 564(b)(1) of the Act, 21 U.S.C. section 360bbb-3(b)(1), unless the authorization is terminated or revoked sooner.  Performed at [facility], [place] [address], [place], [place] [address]   Blood Culture ID Panel (Reflexed)     Status: Abnormal   Collection Time: 06/25/19  1:08 PM  Result Value Ref Range Status   Enterococcus species DETECTED (A) NOT DETECTED Final    Comment: CRITICAL RESULT CALLED TO, READ BACK BY AND VERIFIED WITH: M. RENZ PHARMD, AT [id number] 06/26/19 BY D. VANHOOK    Vancomycin resistance NOT DETECTED NOT DETECTED Final   Listeria monocytogenes NOT DETECTED NOT DETECTED Final   Staphylococcus species NOT DETECTED NOT DETECTED Final   Staphylococcus aureus (BCID) NOT DETECTED NOT DETECTED Final   Streptococcus species NOT DETECTED NOT DETECTED Final   Streptococcus agalactiae NOT DETECTED NOT DETECTED Final   Streptococcus pneumoniae NOT DETECTED NOT DETECTED Final   Streptococcus pyogenes NOT DETECTED NOT DETECTED Final   Acinetobacter baumannii NOT DETECTED NOT DETECTED Final   Enterobacteriaceae species NOT DETECTED NOT DETECTED Final   Enterobacter cloacae complex NOT DETECTED NOT DETECTED Final   Escherichia coli NOT DETECTED NOT DETECTED Final   Klebsiella oxytoca NOT DETECTED NOT DETECTED Final   Klebsiella pneumoniae NOT DETECTED NOT DETECTED Final  Proteus species NOT DETECTED NOT DETECTED Final   Serratia marcescens NOT DETECTED NOT DETECTED Final   Haemophilus  influenzae NOT DETECTED NOT DETECTED Final   Neisseria meningitidis NOT DETECTED NOT DETECTED Final   Pseudomonas aeruginosa NOT DETECTED NOT DETECTED Final   Candida albicans NOT DETECTED NOT DETECTED Final   Candida glabrata NOT DETECTED NOT DETECTED Final   Candida krusei NOT DETECTED NOT DETECTED Final   Candida parapsilosis NOT DETECTED NOT DETECTED Final   Candida tropicalis NOT DETECTED NOT DETECTED Final    Comment: Performed at [facility], [place] [address] [place], [place] [address]  Culture, blood (routine x 2)     Status: None   Collection Time: 06/25/19  1:52 PM   Specimen: BLOOD LEFT HAND  Result Value Ref Range Status   Specimen Description   Final    BLOOD LEFT HAND Performed at [place] [address], [place], [place] [address]    Special Requests   Final    BOTTLES DRAWN AEROBIC AND ANAEROBIC Blood Culture adequate volume Performed at [place] [address] [place], [place] [address]    Culture   Final    NO GROWTH 5 DAYS Performed at [facility], [place] [address], [place], [place] [address]    Report Status 06/30/2019 FINAL  Final  Culture, blood (routine x 2)     Status: None   Collection Time: 06/27/19 11:22 AM   Specimen: BLOOD  Result Value Ref Range Status   Specimen Description   Final    BLOOD RIGHT HAND Performed at [place] [address], [place], [place] [address]    Special Requests   Final    BOTTLES DRAWN AEROBIC AND ANAEROBIC Blood Culture adequate volume Performed at [place] [address] [place], [place] [address]    Culture   Final    NO GROWTH 5 DAYS Performed at [facility], [place] [address] [place], [place] [address]    Report Status 07/02/2019 FINAL  Final  Culture, blood (routine x 2)     Status: None   Collection Time: 06/27/19 11:22 AM   Specimen: BLOOD  Result Value Ref Range Status   Specimen Description    Final    BLOOD LEFT HAND Performed at [place] [address], [place], [place] [address]    Special Requests   Final    BOTTLES DRAWN AEROBIC ONLY Blood Culture adequate volume Performed at [place] [address], [place], [place] [address]    Culture   Final    NO GROWTH 5 DAYS Performed at [facility], [place] [address] [place], [place] [address]    Report Status 07/02/2019 FINAL  Final  Culture, respiratory (non-expectorated)     Status: None   Collection Time: 07/01/19 12:32 PM   Specimen: Bronchoalveolar Lavage; Respiratory  Result Value Ref Range Status   Specimen Description   Final    BRONCHIAL ALVEOLAR LAVAGE Performed at [facility] [address] [place], [place] [address]    Special Requests   Final    NONE Performed at [facility], [place] [address], [place], [place] [address]    Gram Stain NO WBC SEEN NO ORGANISMS SEEN   Final   Culture   Final    Consistent with normal respiratory flora. Performed at [facility], [place],  [place] [address]    Report Status 07/04/2019 FINAL  Final  MRSA PCR Screening     Status: None   Collection Time: 07/02/19 11:29 AM   Specimen: Nasal Mucosa; Nasopharyngeal  Result Value Ref Range Status   MRSA by PCR NEGATIVE NEGATIVE Final    Comment:        The GeneXpert MRSA Assay (FDA approved for NASAL specimens only), is one component of a comprehensive MRSA colonization surveillance program. It is not intended to diagnose MRSA infection nor to guide or monitor treatment for MRSA infections. Performed at [facility], [place] [address], [place], [place] [address]     Studies/Results: DG Foot 2 Views Right  Result Date: 07/06/2019 CLINICAL DATA:  History of fracture the 5th metatarsal 2 weeks ago. EXAM: RIGHT FOOT - 2 VIEW COMPARISON:  06/16/2019 FINDINGS: Comminuted fracture of the base of  the 5th metatarsal shows increased lucency at the fracture site. No definitive callus. No change in alignment. There is increased soft tissue swelling IMPRESSION: Increased lucency at the fracture site. No definitive callus. Electronically Signed   By: Nolon Nations M.D.   On: 07/06/2019 13:56      Assessment/Plan:  INTERVAL HISTORY: patient doing well on zyvox alone   Active Problems:   HTN (hypertension)   Anxiety state   Essential tremor   Renal artery stenosis in 1 of 2 vessels (HCC)   Transaminitis   SOB (shortness of breath)   Chronic respiratory failure with hypoxia and hypercapnia (HCC)   OSA (obstructive sleep apnea)   Hyperkalemia   Hyponatremia   ARF (acute renal failure) (HCC)   Bacteremia due to Enterococcus    Louis [name] is a 64 y.o. male with Enterococcal bacteremia and septic shock. He DID grow the organism from urine and blood though urine was without pyuria.. TEE is negative. His course was complicated by ? Drug fever, and change from AMP to daptomicin then  need for intubation and mechanical ventilation. Changed  To zyvox and cefepime. BAL  Did not grow anything other than normal oral flora and I dc'd cefepime.  Today he looks the best that I have seen him and he and his wife think  He is doing better than he has in months   #1 Enterococcal bacteremia: would finish course of zyvox as planned by Dr. Baxter Flattery  Would get surveillance blood cultures 2 weeks after stopping antibiotics  #2 Reduced clarity function over  Several months: I wonder  If his sleep apnea was not optimally managed by  CPAP he has at home and that if mechanical ventilation has improved his gas exchange and this is ONE reason he feels better.  I will schedule an appt for him in our clinic for followup at which point in time we can get surveillance blood cultures. If he is on  The CIR floor we can also get them there but any time after 2 weeks is OK    LOS: 11 days   Alcide Evener 07/06/2019, 3:28 PM

## 2019-07-06 NOTE — Progress Notes (Signed)
Inpatient Rehab Admissions Coordinator:   Spoke with pt and his wife over the phone.  I do not have any beds available for this patient to admit to CIR today.  Will continue to follow for timing of potential admit this week pending bed availability.   Shann Medal, PT, DPT Admissions Coordinator [phone] 07/06/19  10:24 AM

## 2019-07-06 NOTE — Progress Notes (Signed)
PROGRESS NOTE  Louis [name]  DOB: 09/29/1955  PCP: Rutherford Guys, MD YPP:[id number]  DOA: 06/25/2019  LOS: 11 days   Chief Complaint  Patient presents with  . Tremors  . Fall   Brief narrative: 64 year old male with PMH significant for HTN, CHF, COPD, sleep apnea, GERD, BPH, anxiety, essential tremor.   Patient presented to the ED on 6/24 with fever, tachypnea and confusion.   He was admitted to hospitalist service.  Urine culture and blood culture sent on admission grew Enterococcus.  He was followed by infectious disease.  He underwent TEE on 6/28 that did not show any vegetation.  His condition improved with appropriate antibiotic therapy and was to be discharged to inpatient rehab on June 30. However, overnight from 6/29 through 6/30 he had some confusion, tachypnea.  Apparently he did not have his CPAP mask on that night.  By the morning of 6/30 he was unresponsive.  Blood gas shows pH of 7.00 and PCO2 elevated to more than 120.  Patient underwent intubation and was transferred to ICU. 6/30, underwent bronchoscopy 7/1, extubated 7/2, transfer out to hospitalist service again.  Subjective: Patient was seen and examined this morning. Sitting up in chair.  Not in distress.  No new symptoms. [name] wife was at bedside.  Chart reviewed. Hemodynamically stable.  Breathing in room air. Labs this morning with magnesium remaining low at 1.6, hemoglobin 7.7, with no active evidence of bleeding.  Assessment/Plan: Enterococcal bacteremia and UTI -Currently on linezolid with a plan to treat for 14 days (6/28-7/11).  Healthcare associated pneumonia Acute hypoxemic hypercapnic respiratory failure History of COPD, scoliosis -Patient had hypercapnic respiratory arrest on 6/30. -Underwent bronchoscopy on 6/30 -Bronchoalveolar lavage showed normal flora only. ID consult appreciated.  Acute toxic and metabolic encephalopathy  -Likely due to hypercapnia with PCO2 more than 120.  -Mental status back to normal after hypercapnia resolved.  Essential hypertension Chronic heart failure with preserved ejection fraction -Home meds include Norvasc 5 mg daily, losartan 100 mg daily, Toprol 150 mg daily, Aldactone 25 mg daily, -Currently on reduced dose of metoprolol (due to bradycardia), reduced dose of losartan 50 mg daily and Norvasc at 5 mg daily.  -Blood pressure mostly in normal range. Continue to hold Aldactone. Continue to monitor blood pressure..   Chronic macrocytic anemia Vitamin B12 deficiency -Hemoglobin low at 7.7 today.  No evidence of active bleeding.  I discussed the benefit and risk of blood transfusion with patient and his wife (a physician) at bedside.  1 unit of PRBC transfusion ordered. -Hemoglobin had been running mostly between 9-10 since Jan 2021 with persistent macrocytosis. -Patient reports recent EGD and colonoscopy by Dr. [name], reports negative for any significant findings. -Vitamin B12 level 237 on 6/24.  Started on supplement. -Ferritin level >2300 on 6/24. Repeat level on 7/3 was lower at 1300. -No active bleeding. Continue to monitor hemoglobin.  Hypokalemia/hypomagnesemia/hypophosphatemia -Improving levels with replacement. Labs today with magnesium level still low at 1.6.  2 g IV replacement ordered.  Also ordered for 400 mg oral magnesium oxide twice daily. -Recheck magnesium level tomorrow.  AKI on CKD 2 -Creatinine was elevated at 1.99 on 6/24. -Improved to normal now.  Essential tremor -On beta-blocker.  Hepatic steatosis with history of alcohol abuse -AST/ALT currently normal range.  History of obstructive sleep apnea -Nightly CPAP use  Mobility: Encourage ambulation.  PT eval obtained.  CIR recommended. Code Status:   Code Status: Full Code  Nutritional status: Body mass index is 29.4 kg/m.  Nutrition Problem: Inadequate oral intake Etiology: inability to eat Signs/Symptoms: NPO status Diet Order            Diet  regular Room service appropriate? Yes; Fluid consistency: Thin  Diet effective now                 DVT prophylaxis: heparin injection 5,000 Units Start: 06/25/19 2200   Antimicrobials:  oral Zyvox Fluid: None  Consultants: Critical care, ID Family Communication:  Discussed with patient's wife at bedside  Status is: Inpatient  Remains inpatient appropriate because pending placement to rehab  Dispo:  Patient From: Home  Planned Disposition: CIR  expected discharge date: When bed is available/insurance authorization is obtained.  Medically stable for discharge: Yes  Infusions:    Scheduled Meds: . sodium chloride   Intravenous Once  . amLODipine  5 mg Oral Daily  . Chlorhexidine Gluconate Cloth  6 each Topical Daily  . docusate sodium  100 mg Oral BID  . famotidine  20 mg Oral BID  . feeding supplement (PRO-STAT SUGAR FREE 64)  30 mL Oral BID  . folic acid  1 mg Oral Daily  . gabapentin  300 mg Oral BID  . heparin  5,000 Units Subcutaneous Q8H  . linezolid  600 mg Oral Q12H  . losartan  50 mg Oral Daily  . magnesium oxide  400 mg Oral BID  . mouth rinse  15 mL Mouth Rinse BID  . metoprolol succinate  50 mg Oral Daily  . multivitamin with minerals  1 tablet Oral Daily  . thiamine  100 mg Oral Daily  . vitamin B-12  1,000 mcg Oral Daily    Antimicrobials: Anti-infectives (From admission, onward)   Start     Dose/Rate Route Frequency Ordered Stop   07/03/19 2200  linezolid (ZYVOX) tablet 600 mg     Discontinue     600 mg Oral Every 12 hours 07/03/19 1245     07/01/19 2000  linezolid (ZYVOX) IVPB 600 mg  Status:  Discontinued        600 mg 300 mL/hr over 60 Minutes Intravenous Every 12 hours 07/01/19 1709 07/03/19 1245   07/01/19 1800  ceFEPIme (MAXIPIME) 2 g in sodium chloride 0.9 % 100 mL IVPB  Status:  Discontinued        2 g 200 mL/hr over 30 Minutes Intravenous Every 8 hours 07/01/19 1714 07/04/19 1348   07/01/19 1200  ampicillin (OMNIPEN) 2 g in sodium  chloride 0.9 % 100 mL IVPB  Status:  Discontinued        2 g 300 mL/hr over 20 Minutes Intravenous Every 4 hours 07/01/19 1051 07/01/19 1709   06/29/19 2000  DAPTOmycin (CUBICIN) 700 mg in sodium chloride 0.9 % IVPB  Status:  Discontinued        700 mg 228 mL/hr over 30 Minutes Intravenous Daily 06/29/19 1345 07/01/19 1051   06/28/19 1400  DAPTOmycin (CUBICIN) 600 mg in sodium chloride 0.9 % IVPB  Status:  Discontinued        600 mg 224 mL/hr over 30 Minutes Intravenous Daily 06/28/19 1308 06/29/19 1345   06/26/19 2200  vancomycin (VANCOREADY) IVPB 750 mg/150 mL  Status:  Discontinued        750 mg 150 mL/hr over 60 Minutes Intravenous Every 12 hours 06/26/19 0913 06/26/19 0944   06/26/19 1200  ampicillin (OMNIPEN) 2 g in sodium chloride 0.9 % 100 mL IVPB  Status:  Discontinued        2  g 300 mL/hr over 20 Minutes Intravenous Every 6 hours 06/26/19 0902 06/26/19 0944   06/26/19 1200  ampicillin (OMNIPEN) 2 g in sodium chloride 0.9 % 100 mL IVPB  Status:  Discontinued        2 g 300 mL/hr over 20 Minutes Intravenous Every 4 hours 06/26/19 0948 06/28/19 1245   06/26/19 0945  ampicillin (OMNIPEN) 2 g in sodium chloride 0.9 % 100 mL IVPB  Status:  Discontinued        2 g 300 mL/hr over 20 Minutes Intravenous Every 4 hours 06/26/19 0944 06/26/19 0948   06/26/19 0915  vancomycin (VANCOREADY) IVPB 2000 mg/400 mL  Status:  Discontinued        2,000 mg 200 mL/hr over 120 Minutes Intravenous NOW 06/26/19 0912 06/26/19 0944   06/25/19 1300  cefTRIAXone (ROCEPHIN) 1 g in sodium chloride 0.9 % 100 mL IVPB        1 g 200 mL/hr over 30 Minutes Intravenous  Once 06/25/19 1246 06/25/19 1520      PRN meds: acetaminophen, ALPRAZolam, fentaNYL (SUBLIMAZE) injection, fentaNYL (SUBLIMAZE) injection, ipratropium-albuterol, midazolam, midazolam, ondansetron **OR** ondansetron (ZOFRAN) IV, phenol, sodium chloride flush, traMADol   Objective: Vitals:   07/06/19 0505 07/06/19 1411  BP: (!) 144/86 121/66   Pulse: 75 84  Resp: 20 16  Temp: 98.4 F (36.9 C) 98.1 F (36.7 C)  SpO2: 94% 93%    Intake/Output Summary (Last 24 hours) at 07/06/2019 1618 Last data filed at 07/06/2019 1400 Gross per 24 hour  Intake 1560 ml  Output 1425 ml  Net 135 ml   Filed Weights   07/01/19 0421 07/04/19 0500 07/06/19 0500  Weight: 95.2 kg 98 kg 95.6 kg   Weight change:  Body mass index is 29.4 kg/m.   Physical Exam: General exam: Appears calm and comfortable.  Not in physical distress Skin: No rashes, lesions or ulcers. HEENT: Atraumatic, normocephalic, supple neck, no obvious bleeding Lungs: Clear to auscultation bilaterally.  No crackles or wheezing. CVS: Regular rate and rhythm, no murmur GI/Abd soft, nontender, nondistended, bowel sound present CNS: Alert, awake, oriented x3 Psychiatry: Mood appropriate Extremities: No pedal edema, no calf tenderness  Data Review: I have personally reviewed the laboratory data and studies available.  Recent Labs  Lab 07/01/19 0738 07/02/19 0901 07/03/19 0633 07/04/19 1226 07/06/19 0350  WBC 5.2 5.1 6.6 5.8 5.8  NEUTROABS 3.9 3.1 3.8 3.4 3.2  HGB 9.9* 8.0* 8.0* 8.4* 7.7*  HCT 32.2* 24.9* 24.0* 25.6* 23.9*  MCV 112.2* 105.1* 103.9* 104.1* 104.8*  PLT 339 372 396 450* 447*   Recent Labs  Lab 07/02/19 0901 07/02/19 0901 07/02/19 1735 07/03/19 0633 07/04/19 1226 07/05/19 0400 07/06/19 0350  NA 143  --   --  140 138 137 134*  K 3.0*  --   --  3.0* 3.4* 3.6 3.7  CL 103  --   --  100 99 99 98  CO2 29  --   --  _0 GLUCOSE 202*  --   --  106* 130* 98 103*  BUN 24*  --   --  _1 CREATININE 0.96  --   --  1.06 0.92 0.87 0.86  CALCIUM 8.6*  --   --  8.4* 8.8* 8.4* 8.8*  MG 1.5*   < > 1.9 1.6* 1.4* 1.6* 1.6*  PHOS <1.0*  --  1.2*  --  2.4* 2.9 3.5   < > = values in this interval not  displayed.   Signed, Terrilee Croak, MD [facility] Pager: [phone] (Secure Chat preferred). 07/06/2019

## 2019-07-06 NOTE — Progress Notes (Signed)
Pt states that he will self administer CPAP when ready for bed.  Pt CPAP machine is at bedside and ready for administration.  RT to monitor and assess as needed.

## 2019-07-07 ENCOUNTER — Encounter (HOSPITAL_COMMUNITY): Payer: Self-pay | Admitting: Physical Medicine & Rehabilitation

## 2019-07-07 ENCOUNTER — Other Ambulatory Visit: Payer: Self-pay

## 2019-07-07 ENCOUNTER — Ambulatory Visit: Payer: 59 | Admitting: Physician Assistant

## 2019-07-07 ENCOUNTER — Inpatient Hospital Stay (HOSPITAL_COMMUNITY)
Admission: RE | Admit: 2019-07-07 | Discharge: 2019-07-17 | DRG: 945 | Disposition: A | Discharge status: Home / Self Care | Payer: 59 | Source: Intra-hospital | Attending: Physical Medicine & Rehabilitation | Admitting: Physical Medicine & Rehabilitation

## 2019-07-07 DIAGNOSIS — Z833 Family history of diabetes mellitus: Secondary | ICD-10-CM | POA: Diagnosis not present

## 2019-07-07 DIAGNOSIS — D539 Nutritional anemia, unspecified: Secondary | ICD-10-CM | POA: Diagnosis present

## 2019-07-07 DIAGNOSIS — G621 Alcoholic polyneuropathy: Secondary | ICD-10-CM | POA: Diagnosis present

## 2019-07-07 DIAGNOSIS — K219 Gastro-esophageal reflux disease without esophagitis: Secondary | ICD-10-CM | POA: Diagnosis present

## 2019-07-07 DIAGNOSIS — G250 Essential tremor: Secondary | ICD-10-CM | POA: Diagnosis present

## 2019-07-07 DIAGNOSIS — G629 Polyneuropathy, unspecified: Secondary | ICD-10-CM

## 2019-07-07 DIAGNOSIS — R7881 Bacteremia: Secondary | ICD-10-CM | POA: Diagnosis present

## 2019-07-07 DIAGNOSIS — R296 Repeated falls: Secondary | ICD-10-CM | POA: Diagnosis present

## 2019-07-07 DIAGNOSIS — J9611 Chronic respiratory failure with hypoxia: Secondary | ICD-10-CM

## 2019-07-07 DIAGNOSIS — J440 Chronic obstructive pulmonary disease with acute lower respiratory infection: Secondary | ICD-10-CM | POA: Diagnosis present

## 2019-07-07 DIAGNOSIS — R945 Abnormal results of liver function studies: Secondary | ICD-10-CM | POA: Diagnosis present

## 2019-07-07 DIAGNOSIS — Z79899 Other long term (current) drug therapy: Secondary | ICD-10-CM

## 2019-07-07 DIAGNOSIS — J4520 Mild intermittent asthma, uncomplicated: Secondary | ICD-10-CM

## 2019-07-07 DIAGNOSIS — B952 Enterococcus as the cause of diseases classified elsewhere: Secondary | ICD-10-CM | POA: Diagnosis present

## 2019-07-07 DIAGNOSIS — Z8042 Family history of malignant neoplasm of prostate: Secondary | ICD-10-CM | POA: Diagnosis not present

## 2019-07-07 DIAGNOSIS — E538 Deficiency of other specified B group vitamins: Secondary | ICD-10-CM | POA: Diagnosis present

## 2019-07-07 DIAGNOSIS — Z823 Family history of stroke: Secondary | ICD-10-CM

## 2019-07-07 DIAGNOSIS — R5381 Other malaise: Secondary | ICD-10-CM | POA: Diagnosis present

## 2019-07-07 DIAGNOSIS — I110 Hypertensive heart disease with heart failure: Secondary | ICD-10-CM | POA: Diagnosis present

## 2019-07-07 DIAGNOSIS — M419 Scoliosis, unspecified: Secondary | ICD-10-CM | POA: Diagnosis present

## 2019-07-07 DIAGNOSIS — I5032 Chronic diastolic (congestive) heart failure: Secondary | ICD-10-CM | POA: Diagnosis present

## 2019-07-07 DIAGNOSIS — R270 Ataxia, unspecified: Secondary | ICD-10-CM | POA: Diagnosis present

## 2019-07-07 DIAGNOSIS — S92351D Displaced fracture of fifth metatarsal bone, right foot, subsequent encounter for fracture with routine healing: Secondary | ICD-10-CM | POA: Diagnosis not present

## 2019-07-07 DIAGNOSIS — G4733 Obstructive sleep apnea (adult) (pediatric): Secondary | ICD-10-CM | POA: Diagnosis present

## 2019-07-07 DIAGNOSIS — J9601 Acute respiratory failure with hypoxia: Secondary | ICD-10-CM

## 2019-07-07 LAB — CBC WITH DIFFERENTIAL/PLATELET
Abs Immature Granulocytes: 0.07 10*3/uL (ref 0.00–0.07)
Basophils Absolute: 0 10*3/uL (ref 0.0–0.1)
Basophils Relative: 1 %
Eosinophils Absolute: 0.2 10*3/uL (ref 0.0–0.5)
Eosinophils Relative: 4 %
HCT: 26.6 % — ABNORMAL LOW (ref 39.0–52.0)
Hemoglobin: 8.7 g/dL — ABNORMAL LOW (ref 13.0–17.0)
Immature Granulocytes: 1 %
Lymphocytes Relative: 24 %
Lymphs Abs: 1.4 10*3/uL (ref 0.7–4.0)
MCH: 33.3 pg (ref 26.0–34.0)
MCHC: 32.7 g/dL (ref 30.0–36.0)
MCV: 101.9 fL — ABNORMAL HIGH (ref 80.0–100.0)
Monocytes Absolute: 0.9 10*3/uL (ref 0.1–1.0)
Monocytes Relative: 15 %
Neutro Abs: 3.2 10*3/uL (ref 1.7–7.7)
Neutrophils Relative %: 55 %
Platelets: 385 10*3/uL (ref 150–400)
RBC: 2.61 MIL/uL — ABNORMAL LOW (ref 4.22–5.81)
RDW: 15.3 % (ref 11.5–15.5)
WBC: 5.8 10*3/uL (ref 4.0–10.5)
nRBC: 0 % (ref 0.0–0.2)

## 2019-07-07 LAB — BASIC METABOLIC PANEL
Anion gap: 8 (ref 5–15)
BUN: 14 mg/dL (ref 8–23)
CO2: 26 mmol/L (ref 22–32)
Calcium: 8.8 mg/dL — ABNORMAL LOW (ref 8.9–10.3)
Chloride: 100 mmol/L (ref 98–111)
Creatinine, Ser: 0.8 mg/dL (ref 0.61–1.24)
GFR calc Af Amer: >60 mL/min (ref >60)
GFR calc non Af Amer: >60 mL/min (ref >60)
Glucose, Bld: 101 mg/dL — ABNORMAL HIGH (ref 70–99)
Potassium: 3.6 mmol/L (ref 3.5–5.1)
Sodium: 134 mmol/L — ABNORMAL LOW (ref 135–145)

## 2019-07-07 LAB — BPAM RBC
Blood Product Expiration Date: 202107312359
ISSUE DATE / TIME: 202107052137
Unit Type and Rh: 5100

## 2019-07-07 LAB — TYPE AND SCREEN
ABO/RH(D): O POS
Antibody Screen: NEGATIVE
Unit division: 0

## 2019-07-07 LAB — MAGNESIUM: Magnesium: 1.6 mg/dL — ABNORMAL LOW (ref 1.7–2.4)

## 2019-07-07 MED ORDER — LOSARTAN POTASSIUM 50 MG PO TABS
100.0000 mg | ORAL_TABLET | Freq: Every day | ORAL | Status: DC
  Administered 2019-07-07: 100 mg via ORAL
  Filled 2019-07-07: qty 2

## 2019-07-07 MED ORDER — KATE FARMS STANDARD 1.4 PO LIQD: 325.0000 mL | Freq: Every day | ORAL | Status: DC

## 2019-07-07 MED ORDER — MAGNESIUM OXIDE 400 (241.3 MG) MG PO TABS: 400.0000 mg | ORAL_TABLET | Freq: Two times a day (BID) | ORAL | Status: DC

## 2019-07-07 MED ORDER — FAMOTIDINE 20 MG PO TABS
20.0000 mg | ORAL_TABLET | Freq: Two times a day (BID) | ORAL | Status: DC
  Administered 2019-07-07 – 2019-07-17 (×20): 20 mg via ORAL
  Filled 2019-07-07 (×20): qty 1

## 2019-07-07 MED ORDER — PRO-STAT SUGAR FREE PO LIQD: 30.0000 mL | Freq: Two times a day (BID) | ORAL | 0 refills | Status: DC

## 2019-07-07 MED ORDER — ADULT MULTIVITAMIN W/MINERALS CH
1.0000 | ORAL_TABLET | Freq: Every day | ORAL | Status: DC
  Administered 2019-07-08 – 2019-07-17 (×10): 1 via ORAL
  Filled 2019-07-07 (×10): qty 1

## 2019-07-07 MED ORDER — HEPARIN SOD (PORK) LOCK FLUSH 100 UNIT/ML IV SOLN
500.0000 [IU] | INTRAVENOUS | Status: AC | PRN
  Administered 2019-07-07: 500 [IU]
  Filled 2019-07-07: qty 5

## 2019-07-07 MED ORDER — AMLODIPINE BESYLATE 5 MG PO TABS
5.0000 mg | ORAL_TABLET | Freq: Every day | ORAL | Status: DC
  Administered 2019-07-08 – 2019-07-17 (×10): 5 mg via ORAL
  Filled 2019-07-07 (×10): qty 1

## 2019-07-07 MED ORDER — ADULT MULTIVITAMIN W/MINERALS CH: 1.0000 | ORAL_TABLET | Freq: Every day | ORAL | Status: AC

## 2019-07-07 MED ORDER — METOPROLOL SUCCINATE ER 50 MG PO TB24
50.0000 mg | ORAL_TABLET | Freq: Every day | ORAL | Status: DC
  Administered 2019-07-08 – 2019-07-17 (×10): 50 mg via ORAL
  Filled 2019-07-07 (×10): qty 1

## 2019-07-07 MED ORDER — DIPHENHYDRAMINE HCL 12.5 MG/5ML PO ELIX: 12.5000 mg | ORAL_SOLUTION | Freq: Four times a day (QID) | ORAL | Status: DC | PRN

## 2019-07-07 MED ORDER — CYANOCOBALAMIN 1000 MCG PO TABS: 1000.0000 ug | ORAL_TABLET | Freq: Every day | ORAL | Status: DC

## 2019-07-07 MED ORDER — TRAMADOL HCL 50 MG PO TABS
50.0000 mg | ORAL_TABLET | Freq: Two times a day (BID) | ORAL | Status: DC | PRN
  Filled 2019-07-07: qty 1

## 2019-07-07 MED ORDER — METOPROLOL SUCCINATE ER 50 MG PO TB24: 50.0000 mg | ORAL_TABLET | Freq: Every day | ORAL | Status: DC

## 2019-07-07 MED ORDER — GUAIFENESIN-DM 100-10 MG/5ML PO SYRP: 5.0000 mL | ORAL_SOLUTION | Freq: Four times a day (QID) | ORAL | Status: DC | PRN

## 2019-07-07 MED ORDER — LINEZOLID 600 MG PO TABS: 600.0000 mg | ORAL_TABLET | Freq: Two times a day (BID) | ORAL | Status: DC

## 2019-07-07 MED ORDER — ALPRAZOLAM 0.5 MG PO TABS: 0.5000 mg | ORAL_TABLET | Freq: Every day | ORAL | Status: DC | PRN

## 2019-07-07 MED ORDER — TRAMADOL HCL 50 MG PO TABS: 50.0000 mg | ORAL_TABLET | Freq: Two times a day (BID) | ORAL | Status: DC | PRN

## 2019-07-07 MED ORDER — ORAL CARE MOUTH RINSE
15.0000 mL | Freq: Two times a day (BID) | OROMUCOSAL | Status: DC
  Administered 2019-07-07 – 2019-07-17 (×15): 15 mL via OROMUCOSAL

## 2019-07-07 MED ORDER — GABAPENTIN 300 MG PO CAPS
300.0000 mg | ORAL_CAPSULE | Freq: Two times a day (BID) | ORAL | Status: DC
  Administered 2019-07-07 – 2019-07-17 (×20): 300 mg via ORAL
  Filled 2019-07-07 (×20): qty 1

## 2019-07-07 MED ORDER — ONDANSETRON HCL 4 MG/2ML IJ SOLN: 4.0000 mg | Freq: Four times a day (QID) | INTRAMUSCULAR | Status: DC | PRN

## 2019-07-07 MED ORDER — ALUM & MAG HYDROXIDE-SIMETH 200-200-20 MG/5ML PO SUSP: 30.0000 mL | ORAL | Status: DC | PRN

## 2019-07-07 MED ORDER — FOLIC ACID 1 MG PO TABS: 1.0000 mg | ORAL_TABLET | Freq: Every day | ORAL | Status: DC

## 2019-07-07 MED ORDER — MAGNESIUM SULFATE 2 GM/50ML IV SOLN
2.0000 g | Freq: Once | INTRAVENOUS | Status: AC
  Administered 2019-07-07: 2 g via INTRAVENOUS
  Filled 2019-07-07: qty 50

## 2019-07-07 MED ORDER — VITAMIN B-12 1000 MCG PO TABS
1000.0000 ug | ORAL_TABLET | Freq: Every day | ORAL | Status: DC
  Administered 2019-07-08 – 2019-07-17 (×10): 1000 ug via ORAL
  Filled 2019-07-07 (×10): qty 1

## 2019-07-07 MED ORDER — PRO-STAT SUGAR FREE PO LIQD
30.0000 mL | Freq: Three times a day (TID) | ORAL | Status: DC
  Administered 2019-07-07 – 2019-07-17 (×29): 30 mL via ORAL
  Filled 2019-07-07 (×29): qty 30

## 2019-07-07 MED ORDER — HEPARIN SODIUM (PORCINE) 5000 UNIT/ML IJ SOLN
5000.0000 [IU] | Freq: Three times a day (TID) | INTRAMUSCULAR | Status: DC
  Administered 2019-07-07 – 2019-07-14 (×19): 5000 [IU] via SUBCUTANEOUS
  Filled 2019-07-07 (×19): qty 1

## 2019-07-07 MED ORDER — POLYETHYLENE GLYCOL 3350 17 G PO PACK: 17.0000 g | PACK | Freq: Every day | ORAL | Status: DC | PRN

## 2019-07-07 MED ORDER — ACETAMINOPHEN 325 MG PO TABS: 325.0000 mg | ORAL_TABLET | ORAL | Status: DC | PRN

## 2019-07-07 MED ORDER — HEPARIN SODIUM (PORCINE) 5000 UNIT/ML IJ SOLN: 5000.0000 [IU] | Freq: Three times a day (TID) | INTRAMUSCULAR | Status: DC

## 2019-07-07 MED ORDER — KATE FARMS STANDARD 1.4 PO LIQD
325.0000 mL | Freq: Every day | ORAL | Status: DC
  Administered 2019-07-07: 325 mL via ORAL
  Filled 2019-07-07 (×4): qty 325

## 2019-07-07 MED ORDER — FOLIC ACID 1 MG PO TABS
1.0000 mg | ORAL_TABLET | Freq: Every day | ORAL | Status: DC
  Administered 2019-07-08 – 2019-07-17 (×10): 1 mg via ORAL
  Filled 2019-07-07 (×10): qty 1

## 2019-07-07 MED ORDER — LINEZOLID 600 MG PO TABS
600.0000 mg | ORAL_TABLET | Freq: Two times a day (BID) | ORAL | Status: AC
  Administered 2019-07-07 – 2019-07-12 (×11): 600 mg via ORAL
  Filled 2019-07-07 (×11): qty 1

## 2019-07-07 MED ORDER — ONDANSETRON HCL 4 MG PO TABS: 4.0000 mg | ORAL_TABLET | Freq: Four times a day (QID) | ORAL | Status: DC | PRN

## 2019-07-07 MED ORDER — CHLORHEXIDINE GLUCONATE CLOTH 2 % EX PADS
6.0000 | MEDICATED_PAD | Freq: Every day | CUTANEOUS | Status: DC
  Administered 2019-07-08: 6 via TOPICAL

## 2019-07-07 MED ORDER — THIAMINE HCL 100 MG PO TABS: 100.0000 mg | ORAL_TABLET | Freq: Every day | ORAL | Status: DC

## 2019-07-07 MED ORDER — FLEET ENEMA 7-19 GM/118ML RE ENEM: 1.0000 | ENEMA | Freq: Once | RECTAL | Status: DC | PRN

## 2019-07-07 MED ORDER — BISACODYL 10 MG RE SUPP: 10.0000 mg | Freq: Every day | RECTAL | Status: DC | PRN

## 2019-07-07 MED ORDER — MAGNESIUM OXIDE 400 (241.3 MG) MG PO TABS
400.0000 mg | ORAL_TABLET | Freq: Two times a day (BID) | ORAL | Status: DC
  Administered 2019-07-07 – 2019-07-11 (×8): 400 mg via ORAL
  Filled 2019-07-07 (×8): qty 1

## 2019-07-07 MED ORDER — MELATONIN 3 MG PO TABS: 3.0000 mg | ORAL_TABLET | Freq: Every evening | ORAL | Status: DC | PRN

## 2019-07-07 MED ORDER — KATE FARMS STANDARD 1.4 PO LIQD
325.0000 mL | Freq: Every day | ORAL | Status: DC
  Filled 2019-07-07: qty 325

## 2019-07-07 MED ORDER — THIAMINE HCL 100 MG PO TABS
100.0000 mg | ORAL_TABLET | Freq: Every day | ORAL | Status: DC
  Administered 2019-07-08 – 2019-07-17 (×10): 100 mg via ORAL
  Filled 2019-07-07 (×10): qty 1

## 2019-07-07 MED ORDER — DOCUSATE SODIUM 100 MG PO CAPS: 100.0000 mg | ORAL_CAPSULE | Freq: Two times a day (BID) | ORAL | Status: DC

## 2019-07-07 MED ORDER — LOSARTAN POTASSIUM 50 MG PO TABS
100.0000 mg | ORAL_TABLET | Freq: Every day | ORAL | Status: DC
  Administered 2019-07-08 – 2019-07-17 (×10): 100 mg via ORAL
  Filled 2019-07-07 (×10): qty 2

## 2019-07-07 MED ORDER — PHENOL 1.4 % MT LIQD
1.0000 | OROMUCOSAL | Status: DC | PRN
  Filled 2019-07-07: qty 177

## 2019-07-07 MED ORDER — IPRATROPIUM-ALBUTEROL 0.5-2.5 (3) MG/3ML IN SOLN: 3.0000 mL | Freq: Four times a day (QID) | RESPIRATORY_TRACT | Status: DC | PRN

## 2019-07-07 MED ORDER — DOCUSATE SODIUM 100 MG PO CAPS: 100.0000 mg | ORAL_CAPSULE | Freq: Two times a day (BID) | ORAL | Status: DC | PRN

## 2019-07-07 NOTE — Progress Notes (Signed)
Physical Therapy Treatment Patient Details Name: Louis [name] MRN: [id number] DOB: Nov 08, 1955 Today's Date: 07/07/2019    History of Present Illness Louis [name] is a 64 y.o. male with a history of scoliosis with restrictive and obstructive lung disease, chronic HFpEF, OSA and nocturnal hypoxia on CPAP and 2L O2, essential tremor, hepatic steatosis, and perforated duodenal ulcer s/p [name] Jan 2021 who presented to the ED with increasing falls and progressive weakness. He was found to be febrile to 101.69F and tachypneic though not hypoxemic. Work up included CXR with indistinct hazy left-sided opacity and subsequent CTA chest after d-dimer was 1.90 which had motion degradation without PE or focal consolidation. There were incidental findings including renal lesions for which abdominal MR was recommended.    PT Comments    Pt ambulated in hallway and did not require rest breaks today.  Pt hopeful for d/c to CIR soon.  Follow Up Recommendations  CIR     Equipment Recommendations  Rolling walker with 5" wheels    Recommendations for Other Services       Precautions / Restrictions Precautions Precautions: Fall Restrictions Weight Bearing Restrictions: No Other Position/Activity Restrictions: per chart: WBAT in shoes with carbon plate inserts per wife    Mobility  Bed Mobility Overal bed mobility: Needs Assistance Bed Mobility: Supine to Sit     Supine to sit: Min guard;HOB elevated     General bed mobility comments: pt in recliner on arrival  Pt required assist from PT for donning socks and shoes prior to mobility.  Transfers Overall transfer level: Needs assistance Equipment used: Rolling walker (2 wheeled) Transfers: Sit to/from Stand Sit to Stand: Min guard         General transfer comment: pt able to recall hand placement and self assisted with UEs  Ambulation/Gait Ambulation/Gait assistance: Min assist Gait Distance (Feet): 200 Feet Assistive device:  Rolling walker (2 wheeled) Gait Pattern/deviations: Step-through pattern;Decreased stride length;Trunk flexed     General Gait Details: verbal cues for posture and RW distance, able to ambulate without any rest breaks today, SpO2 92% on room air, HR 95 and BP 153/81 upon sitting in recliner   Stairs             Wheelchair Mobility    Modified Rankin (Stroke Patients Only)       Balance Overall balance assessment: History of Falls Sitting-balance support: Feet supported Sitting balance-Leahy Scale: Good     Standing balance support: During functional activity;Bilateral upper extremity supported Standing balance-Leahy Scale: Fair                              Cognition Arousal/Alertness: Awake/alert Behavior During Therapy: WFL for tasks assessed/performed Overall Cognitive Status: Within Functional Limits for tasks assessed                                        Exercises      General Comments        Pertinent Vitals/Pain Pain Assessment: No/denies pain Faces Pain Scale: No hurt    Home Living                      Prior Function            PT Goals (current goals can now be found in the care plan section) Acute  Rehab PT Goals Patient Stated Goal: I want to walk without any assistance Progress towards PT goals: Progressing toward goals    Frequency    Min 3X/week      PT Plan Current plan remains appropriate    Co-evaluation              AM-PAC PT "6 Clicks" Mobility   Outcome Measure  Help needed turning from your back to your side while in a flat bed without using bedrails?: A Little Help needed moving from lying on your back to sitting on the side of a flat bed without using bedrails?: A Little Help needed moving to and from a bed to a chair (including a wheelchair)?: A Little Help needed standing up from a chair using your arms (e.g., wheelchair or bedside chair)?: A Little Help needed to walk  in hospital room?: A Little Help needed climbing 3-5 steps with a railing? : A Lot 6 Click Score: 17    End of Session Equipment Utilized During Treatment: Gait belt Activity Tolerance: Patient tolerated treatment well Patient left: in chair;with call bell/phone within reach;with chair alarm set   PT Visit Diagnosis: Other abnormalities of gait and mobility (R26.89)     Time: 8403-7543 PT Time Calculation (min) (ACUTE ONLY): 17 min  Charges:  $Gait Training: 8-22 mins                     Arlyce Dice, DPT Acute Rehabilitation Services Pager: [phone] Office: [place] 07/07/2019, 12:48 PM

## 2019-07-07 NOTE — Progress Notes (Signed)
Patient arrived to unit, no s/s of distress noted at this time. Patient denies any pain, head to toe assessment completed with second nurse. No complications noted at this time.  Audie Clear, LPN

## 2019-07-07 NOTE — Progress Notes (Signed)
Inpatient Rehab Admissions Coordinator:   Pt to go to room 4w03 at [facility].  [facility] arranged for pickup ~3pm.    Shann Medal, PT, DPT Admissions Coordinator [phone] 07/07/19  1:51 PM

## 2019-07-07 NOTE — Discharge Summary (Signed)
Physician Discharge Summary  Louis [name] PFX:[id number] DOB: 15-Feb-1955 DOA: 06/25/2019  PCP: Rutherford Guys, MD  Admit date: 06/25/2019 Discharge date: 07/07/2019  Admitted From: Home Discharge disposition: CIR   Code Status: Full Code  Diet Recommendation: Cardiac diet   Recommendations for Outpatient Follow-Up:   1. Follow-up with PCP as an outpatient 2. Follow-up with pulmonology as an outpatient 3. Follow-up with orthopedics as an outpatient  Discharge Diagnosis:   Active Problems:   HTN (hypertension)   Anxiety state   Essential tremor   Renal artery stenosis in 1 of 2 vessels (HCC)   Transaminitis   SOB (shortness of breath)   Chronic respiratory failure with hypoxia and hypercapnia (HCC)   OSA (obstructive sleep apnea)   Hyperkalemia   Hyponatremia   AKI (acute kidney injury) ([place])   Enterococcal bacteremia   Acute respiratory failure with hypoxia (HCC)   Atelectasis    History of Present Illness / Brief narrative:  64 year old male with PMH significant for HTN, CHF, COPD, sleep apnea, GERD, BPH, anxiety, essential tremor.   Patient presented to the ED on 6/24 with fever, tachypnea and confusion.   He was admitted to hospitalist service.  Urine culture and blood culture sent on admission grew Enterococcus.  He was followed by infectious disease.  He underwent TEE on 6/28 that did not show any vegetation.  His condition improved with appropriate antibiotic therapy and was to be discharged to inpatient rehab on June 30. However, overnight from 6/29 through 6/30 he had some confusion, tachypnea.  Apparently he did not have his CPAP mask on that night.  By the morning of 6/30 he was unresponsive.  Blood gas shows pH of 7.00 and PCO2 elevated to more than 120.  Patient underwent intubation and was transferred to ICU. 6/30, underwent bronchoscopy 7/1, extubated 7/2, transfer out to hospitalist service again  Hospital Course:  Enterococcal bacteremia and  UTI -Currently on linezolid with a plan to treat for 14 days (6/28-7/11).  Healthcare associated pneumonia Acute hypoxemic hypercapnic respiratory failure History of COPD, scoliosis -Patient had hypercapnic respiratory arrest on 6/30. -Underwent bronchoscopy on 6/30 -Bronchoalveolar lavage showed normal flora only. ID consult appreciated.   Acute toxic and metabolic encephalopathy  -Likely due to hypercapnia with PCO2 more than 120. -Mental status back to normal after hypercapnia resolved.  Essential hypertension Chronic heart failure with preserved ejection fraction -Home meds include Norvasc 5 mg daily, losartan 100 mg daily, Toprol 150 mg daily, Aldactone 25 mg daily. -Medications adjusted based on heart rate and blood pressure. -At discharge, patient will be on Norvasc 5 mg daily, losartan 100 mg daily and Toprol 50 mg daily.  Aldactone on hold.   Chronic macrocytic anemia Vitamin B12 deficiency -Hemoglobin low at 7.7 yesterday on 7/5.[name]  No evidence of active bleeding.  I discussed the benefit and risk of blood transfusion with patient and his wife (a physician) at bedside.  1 unit of PRBC transfusion ordered.  Hemoglobin up to 8.7 today. -Hemoglobin had been running mostly between 9-10 since Jan 2021 with persistent macrocytosis. -Patient reports recent EGD and colonoscopy by Dr. [name], reports negative for any significant findings. -Vitamin B12 level 237 on 6/24.  Started on supplement. -Ferritin level >2300 on 6/24. Repeat level on 7/3 was lower at 1300. -No active bleeding. Continue to monitor hemoglobin.  Repeat ferritin level as an outpatient.  Hypokalemia/hypomagnesemia/hypophosphatemia -Potassium magnesium and phosphorus level checked and adequately replaced.   -Particularly his magnesium level has been running low  despite replacement.  On 7/5, I started him on magnesium oxide oral 400 mg twice daily.  Repeat magnesium level in next 3 days to adjust the dose and  frequency.    AKI on CKD 2 -Creatinine was elevated at 1.99 on 6/24. -Improved to normal now.  Essential tremor -On beta-blocker and as needed Xanax.  Low  Hepatic steatosis with history of alcohol abuse -AST/ALT currently normal range.  History of obstructive sleep apnea -Nightly CPAP use -At patient and family's request today, I discussed with pulmonary PA Marni Griffon)  to inform pulmonologist Dr. Ezra Sites office to consider adjustment of CPAP settings.  Recent history of right foot metatarsal fracture -Repeat x-ray obtaine on 7/5.  It showed increased lucency at the fracture site. No definitive callus. -Follow-up outpatient with Dr. Sharol Given.  Mobility: Encourage ambulation.  PT eval obtained.  CIR recommended. Code Status:  Code Status: Full Code   Antimicrobials: oral Zyvox till 7/11  Consultants: Critical care, ID Family Communication: Discussed with patient's wife on the phone this morning  Wound care:    Subjective:  Seen and examined this morning.  Sitting up in chair.  Not in distress.  No new symptoms.  Feels better.  Discussed with patient's wife Dr. Margaretha Glassing on the phone.  Discharge Exam:   Vitals:   07/06/19 2158 07/07/19 0029 07/07/19 0508 07/07/19 1307  BP: 140/82 (!) 148/81 (!) 147/81 137/79  Pulse: 69 76 72 70  Resp: _0 Temp: 98.1 F (36.7 C) 98.8 F (37.1 C) 98.5 F (36.9 C) 98.5 F (36.9 C)  TempSrc: Oral Oral Oral Oral  SpO2: 99% 100% 93%   Weight:   96.9 kg   Height:        Body mass index is 29.79 kg/m.  General exam: Appears calm and comfortable.  No physical distress Skin: No rashes, lesions or ulcers. HEENT: Atraumatic, normocephalic, supple neck, no obvious bleeding Lungs: Clear to auscultation bilaterally CVS: Regular rate and rhythm, no murmur GI/Abd soft, nontender, nondistended, bowel sound present CNS: Alert, awake, oriented x3 Psychiatry: Mood appropriate Extremities: No pedal edema, no calf  tenderness  Discharge Instructions:   Discharge Instructions    Diet - low sodium heart healthy   Complete by: As directed    Increase activity slowly   Complete by: As directed       Follow-up Information    Rutherford Guys, MD Follow up.   Specialty: Family Medicine Contact information: [address] [place] [address] [phone]        Troy Sine, MD .   Specialty: Cardiology Contact information: [address] [place] [address] [phone]        Rigoberto Noel, MD Follow up.   Specialty: Pulmonary Disease Contact information: [place] [address] [phone]              Allergies as of 07/07/2019      Reactions   Accupril [quinapril Hcl] Cough   Nsaids    Perforated gastric ulcer      Medication List    STOP taking these medications   pantoprazole 40 MG tablet Commonly known as: Protonix   spironolactone 25 MG tablet Commonly known as: ALDACTONE     TAKE these medications   ALPRAZolam 0.5 MG tablet Commonly known as: XANAX Take 1 tablet (0.5 mg total) by mouth daily as needed (essential tremor).   amLODipine 5 MG tablet Commonly known as:  NORVASC TAKE 1 TABLET(5 MG) BY MOUTH DAILY What changed: See the new instructions.   cyanocobalamin 1000 MCG tablet Take 1 tablet (1,000 mcg total) by mouth daily. Start taking on: July 08, 2019   feeding supplement (KATE FARMS STANDARD 1.4) Liqd liquid Take 325 mLs by mouth daily.   feeding supplement (PRO-STAT SUGAR FREE 64) Liqd Take 30 mLs by mouth 2 (two) times daily.   folic acid 1 MG tablet Commonly known as: FOLVITE Take 1 tablet (1 mg total) by mouth daily. Start taking on: July 08, 2019   gabapentin 300 MG capsule Commonly known as: NEURONTIN Take 300 mg by mouth 2 (two) times daily.   linezolid 600 MG tablet Commonly known as: ZYVOX Take 1 tablet (600 mg total) by mouth every 12 (twelve) hours for 5 days.   losartan  100 MG tablet Commonly known as: COZAAR Take 1 tablet (100 mg total) by mouth daily. NEED OV.   magnesium oxide 400 (241.3 Mg) MG tablet Commonly known as: MAG-OX Take 1 tablet (400 mg total) by mouth 2 (two) times daily.   metoprolol succinate 50 MG 24 hr tablet Commonly known as: TOPROL-XL Take 1 tablet (50 mg total) by mouth daily. Take with or immediately following a meal. Start taking on: July 08, 2019 What changed: how much to take   multivitamin with minerals Tabs tablet Take 1 tablet by mouth daily. Start taking on: July 08, 2019   thiamine 100 MG tablet Take 1 tablet (100 mg total) by mouth daily. Start taking on: July 08, 2019   traMADol 50 MG tablet Commonly known as: ULTRAM Take 1 tablet (50 mg total) by mouth every 12 (twelve) hours as needed (pain).       Time coordinating discharge: 35 minutes  The results of significant diagnostics from this hospitalization (including imaging, microbiology, ancillary and laboratory) are listed below for reference.    Procedures and Diagnostic Studies:   CT Head Wo Contrast  Result Date: 06/25/2019 CLINICAL DATA:  Frequent falls EXAM: CT HEAD WITHOUT CONTRAST TECHNIQUE: Contiguous axial images were obtained from the base of the skull through the vertex without intravenous contrast. COMPARISON:  None. FINDINGS: Brain: There is no acute intracranial hemorrhage, mass effect, or edema. Gray-white differentiation is preserved. There is no extra-axial fluid collection. Prominence of the ventricles and sulci reflects mild generalized parenchymal volume loss. Vascular: There is mild atherosclerotic calcification at the skull base. Skull: Calvarium is unremarkable. Sinuses/Orbits: No acute finding. Other: None. IMPRESSION: No acute intracranial hemorrhage, mass effect, or evidence of acute infarction. Electronically Signed   By: Macy Mis M.D.   On: 06/25/2019 14:43   CT Angio Chest PE W/Cm &/Or Wo Cm  Result Date: 06/25/2019 CLINICAL  DATA:  64 year old male with history of shortness of breath. EXAM: CT ANGIOGRAPHY CHEST WITH CONTRAST TECHNIQUE: Multidetector CT imaging of the chest was performed using the standard protocol during bolus administration of intravenous contrast. Multiplanar CT image reconstructions and MIPs were obtained to evaluate the vascular anatomy. CONTRAST:  20m OMNIPAQUE IOHEXOL 350 MG/ML SOLN COMPARISON:  Chest CTA 07/02/2017. FINDINGS: Cardiovascular: Today's study is significantly limited by large amount of patient respiratory motion. With this limitation in mind, there is no definite central, lobar or segmental sized pulmonary embolism. Smaller subsegmental sized pulmonary emboli cannot be completely excluded given the limitations of today's examination. Heart size is normal. There is no significant pericardial fluid, thickening or pericardial calcification. Aortic atherosclerosis. No definite coronary artery calcifications. Mediastinum/Nodes: No pathologically enlarged mediastinal or  hilar lymph nodes. Esophagus is unremarkable in appearance. No axillary lymphadenopathy. Lungs/Pleura: No suspicious appearing pulmonary nodules or masses are noted. No acute consolidative airspace disease. No pleural effusions. Scattered areas of mild linear scarring in the lung bases bilaterally. Upper Abdomen: Severe diffuse low attenuation throughout the hepatic parenchyma, indicative of severe hepatic steatosis. 1 cm exophytic high attenuation lesion (58 HU) lesion in the upper pole the left kidney, incompletely characterized. 4.5 cm low-attenuation lesion in the upper pole the left kidney, compatible with a simple cyst. Small low-attenuation lesion incompletely imaged in the upper pole the right kidney, also likely to represent a small cyst. Musculoskeletal: Severe dextroscoliosis of the thoracolumbar spine. There are no aggressive appearing lytic or blastic lesions noted in the visualized portions of the skeleton. Review of the MIP  images confirms the above findings. IMPRESSION: 1. Limited examination secondary to patient respiratory motion demonstrating no central, lobar or segmental sized pulmonary embolism. 2. No acute findings are noted in the thorax to account for the patient's symptoms. 3. Aortic atherosclerosis. 4. Severe hepatic steatosis. 5. 1 cm exophytic high attenuation lesion in the upper pole the left kidney, incompletely characterized on today's examination. Follow-up evaluation with nonemergent abdominal MRI with and without IV gadolinium is strongly recommended in the near future to exclude the possibility of neoplasm. Aortic Atherosclerosis (ICD10-I70.0). Electronically Signed   By: Vinnie Langton M.D.   On: 06/25/2019 14:47   CT Cervical Spine Wo Contrast  Result Date: 06/25/2019 CLINICAL DATA:  Multiple falls EXAM: CT CERVICAL SPINE WITHOUT CONTRAST TECHNIQUE: Multidetector CT imaging of the cervical spine was performed without intravenous contrast. Multiplanar CT image reconstructions were also generated. COMPARISON:  None. FINDINGS: Alignment: Facet joints aligned without dislocation. Dens and lateral masses aligned. No static listhesis. Skull base and vertebrae: No acute fracture. Rounded 8 mm area of lucency within the right posterolateral aspect of the C3 vertebral body (series 6, image 21). 9 mm sclerotic lesion within the right posterolateral aspect of the C6 vertebral body (series 5, image 21). Lesions are nonexpansile. Soft tissues and spinal canal: No prevertebral fluid or swelling. No visible canal hematoma. Disc levels: Intervertebral disc height loss at C3-4 through C5-6. No evidence of high-grade foraminal or canal stenosis by CT. Upper chest: Visualized lung apices are clear. Other: None. IMPRESSION: 1. No acute fracture or static listhesis of the cervical spine. 2. Mild multilevel degenerative changes of the cervical spine. 3. Rounded 8 mm area of lucency within the C3 vertebral body and 9 mm sclerotic  lesion within the C6 vertebral body. Findings are nonspecific and may represent benign lesions such as hemangioma and bone island. However, if there is concern for metastatic disease, a nonemergent nuclear medicine bone scan could be performed for further evaluation. Electronically Signed   By: Davina Poke D.O.   On: 06/25/2019 14:34   MR BRAIN WO CONTRAST  Result Date: 06/25/2019 CLINICAL DATA:  Stroke.  Weakness. EXAM: MRI HEAD WITHOUT CONTRAST TECHNIQUE: Multiplanar, multiecho pulse sequences of the brain and surrounding structures were obtained without intravenous contrast. COMPARISON:  CT head 06/25/2019 FINDINGS: Brain: Negative for acute infarct Moderate atrophy. Minimal white matter changes. Brainstem and cerebellum intact. Negative for hemorrhage or mass. No fluid collection or midline shift. Vascular: Normal arterial flow voids. Skull and upper cervical spine: Normal bone marrow Sinuses/Orbits: Paranasal sinuses clear. No orbital lesion. Other: None IMPRESSION: Negative for acute infarct Moderate atrophy.  No acute abnormality. Electronically Signed   By: Franchot Gallo M.D.   On: 06/25/2019  18:32   DG Chest Port 1 View  Result Date: 06/25/2019 CLINICAL DATA:  Shortness of breath. Additional provided: History of COPD, CHF, shortness of breath, hypertension. EXAM: PORTABLE CHEST 1 VIEW COMPARISON:  Chest radiograph 01/06/2019 FINDINGS: Unchanged cardiomegaly. Hazy opacity throughout the left mid to lower lung field. The right lung is grossly clear. No evidence of pneumothorax. No acute bony abnormality. Redemonstrated prominent thoracic dextrocurvature. Healed fracture of the posterior left eighth rib. IMPRESSION: Hazy opacity throughout the left mid to lower lung field. This may reflect pneumonia, atelectasis asymmetric edema or possibly a layering pleural effusion. The right lung is grossly clear. Unchanged cardiomegaly. Electronically Signed   By: Kellie Simmering DO   On: 06/25/2019 12:30    ECHOCARDIOGRAM COMPLETE  Result Date: 06/27/2019    ECHOCARDIOGRAM REPORT   Patient Name:   DONTREAL MIERA Date of Exam: 06/26/2019 Medical Rec #:  [id number]    Height:       71.0 in Accession #:    [id number]   Weight:       220.0 lb Date of Birth:  1955/05/11    BSA:          2.196 m Patient Age:    63 years     BP:           121/73 mmHg Patient Gender: M            HR:           86 bpm. Exam Location:  Inpatient Procedure: 2D Echo, Cardiac Doppler, Color Doppler and Intracardiac            Opacification Agent Indications:    Bacteremia 790.7 / R78.81  History:        Patient has prior history of Echocardiogram examinations, most                 recent 07/03/2017. Cardiomegaly, Pulmonary HTN,                 Signs/Symptoms:Shortness of Breath; Risk Factors:Hypertension                 and Non-Smoker. Sepsis.  Sonographer:    Vickie Epley RDCS Referring Phys: [id number] Patrecia Pour  Sonographer Comments: Technically difficult study due to poor echo windows. Image acquisition challenging due to patient body habitus. IMPRESSIONS  1. Left ventricular ejection fraction, by estimation, is 55 to 60%. The left ventricle has normal function. The left ventricle has no regional wall motion abnormalities. Left ventricular diastolic parameters were normal.  2. Right ventricular systolic function is mildly reduced at the RV apex. The right ventricular size is normal. Tricuspid regurgitation signal is inadequate for assessing PA pressure.  3. LA not well visualized. Left atrial size was mildly dilated by visual estimate.  4. The mitral valve is normal in structure. Trivial mitral valve regurgitation. No evidence of mitral stenosis.  5. The aortic valve is tricuspid. Aortic valve regurgitation is not visualized. No aortic stenosis is present.  6. Aortic dilatation noted. There is mild dilatation of the aortic root measuring 43 mm.  7. The inferior vena cava is normal in size with greater than 50% respiratory variability, suggesting  right atrial pressure of 3 mmHg. Conclusion(s)/Recommendation(s): No evidence of valvular vegetations on this transthoracic echocardiogram. Would recommend a transesophageal echocardiogram to exclude infective endocarditis if clinically indicated. This exam was suboptimal for the assessment of valvular lesions due to image quality. FINDINGS  Left Ventricle: Left ventricular ejection fraction, by estimation, is  55 to 60%. The left ventricle has normal function. The left ventricle has no regional wall motion abnormalities. Definity contrast agent was given IV to delineate the left ventricular  endocardial borders. The left ventricular internal cavity size was normal in size. There is no left ventricular hypertrophy. Left ventricular diastolic parameters were normal. Right Ventricle: The right ventricular size is normal. No increase in right ventricular wall thickness. Right ventricular systolic function is mildly reduced at the RV apex. Tricuspid regurgitation signal is inadequate for assessing PA pressure. Left Atrium: LA not well visualized. Left atrial size was mildly dilated. Right Atrium: Right atrial size was not well visualized. Pericardium: There is no evidence of pericardial effusion. Presence of pericardial fat pad. Mitral Valve: The mitral valve is normal in structure. Normal mobility of the mitral valve leaflets. Trivial mitral valve regurgitation. No evidence of mitral valve stenosis. Tricuspid Valve: The tricuspid valve is normal in structure. Tricuspid valve regurgitation is not demonstrated. No evidence of tricuspid stenosis. Aortic Valve: The aortic valve is tricuspid. Aortic valve regurgitation is not visualized. No aortic stenosis is present. There is mild calcification of the aortic valve. Pulmonic Valve: The pulmonic valve was grossly normal. Pulmonic valve regurgitation is trivial. No evidence of pulmonic stenosis. Aorta: Aortic dilatation noted. There is mild dilatation of the aortic root  measuring 43 mm. Venous: The inferior vena cava is normal in size with greater than 50% respiratory variability, suggesting right atrial pressure of 3 mmHg. IAS/Shunts: No atrial level shunt detected by color flow Doppler.  LEFT VENTRICLE PLAX 2D LVIDd:         5.50 cm      Diastology LVIDs:         4.40 cm      LV e' lateral:   11.70 cm/s LV PW:         0.90 cm      LV E/e' lateral: 4.5 LV IVS:        0.90 cm      LV e' medial:    7.94 cm/s LVOT diam:     2.20 cm      LV E/e' medial:  6.6 LV SV:         72 LV SV Index:   33 LVOT Area:     3.80 cm  LV Volumes (MOD) LV vol d, MOD A2C: 119.0 ml LV vol d, MOD A4C: 156.0 ml LV vol s, MOD A2C: 62.5 ml LV vol s, MOD A4C: 60.4 ml LV SV MOD A2C:     56.5 ml LV SV MOD A4C:     156.0 ml LV SV MOD BP:      82.2 ml RIGHT VENTRICLE RV S prime:     12.50 cm/s TAPSE (M-mode): 1.6 cm LEFT ATRIUM             Index       RIGHT ATRIUM           Index LA diam:        4.20 cm 1.91 cm/m  RA Area:     13.20 cm LA Vol (A2C):   35.3 ml 16.08 ml/m RA Volume:   32.00 ml  14.57 ml/m LA Vol (A4C):   56.5 ml 25.73 ml/m LA Biplane Vol: 44.6 ml 20.31 ml/m  AORTIC VALVE LVOT Vmax:   117.00 cm/s LVOT Vmean:  79.700 cm/s LVOT VTI:    0.189 m  AORTA Ao Root diam: 4.30 cm MITRAL VALVE MV Area (PHT): 3.74 cm  SHUNTS MV Decel Time: 203 msec    Systemic VTI:  0.19 m MV E velocity: 52.60 cm/s  Systemic Diam: 2.20 cm MV A velocity: 53.00 cm/s MV E/A ratio:  0.99 Cherlynn Kaiser MD Electronically signed by Cherlynn Kaiser MD Signature Date/Time: 06/27/2019/6:20:35 AM    Final      Labs:   Basic Metabolic Panel: Recent Labs  Lab 07/02/19 0901 07/02/19 0901 07/02/19 1735 07/03/19 1829 07/03/19 9371 07/04/19 1226 07/04/19 1226 07/05/19 0400 07/05/19 0400 07/06/19 0350 07/07/19 0444  NA 143   < >  --  140  --  138  --  137  --  134* 134*  K 3.0*   < >  --  3.0*   < > 3.4*   < > 3.6   < > 3.7 3.6  CL 103   < >  --  100  --  99  --  99  --  98 100  CO2 29   < >  --  30  --  29  --   30  --  28 26  GLUCOSE 202*   < >  --  106*  --  130*  --  98  --  103* 101*  BUN 24*   < >  --  18  --  15  --  12  --  14 14  CREATININE 0.96   < >  --  1.06  --  0.92  --  0.87  --  0.86 0.80  CALCIUM 8.6*   < >  --  8.4*  --  8.8*  --  8.4*  --  8.8* 8.8*  MG 1.5*   < > 1.9 1.6*  --  1.4*  --  1.6*  --  1.6* 1.6*  PHOS <1.0*  --  1.2*  --   --  2.4*  --  2.9  --  3.5  --    < > = values in this interval not displayed.   GFR Estimated Creatinine Clearance: 113.6 mL/min (by C-G formula based on SCr of 0.8 mg/dL). Liver Function Tests: Recent Labs  Lab 07/01/19 0316 07/01/19 0738 07/02/19 0901 07/04/19 1226  AST 36 46* 31 39  ALT 48* 51* 36 35  ALKPHOS 74 74 64 63  BILITOT 0.5 0.4 0.6 0.9  PROT 6.2* 6.8 5.9* 6.7  ALBUMIN 2.7* 2.8* 2.4* 2.9*   No results for input(s): LIPASE, AMYLASE in the last 168 hours. Recent Labs  Lab 07/01/19 0738 07/01/19 1509  AMMONIA 75* 34   Coagulation profile No results for input(s): INR, PROTIME in the last 168 hours.  CBC: Recent Labs  Lab 07/02/19 0901 07/03/19 0633 07/04/19 1226 07/06/19 0350 07/07/19 0444  WBC 5.1 6.6 5.8 5.8 5.8  NEUTROABS 3.1 3.8 3.4 3.2 3.2  HGB 8.0* 8.0* 8.4* 7.7* 8.7*  HCT 24.9* 24.0* 25.6* 23.9* 26.6*  MCV 105.1* 103.9* 104.1* 104.8* 101.9*  PLT 372 396 450* 447* 385   Cardiac Enzymes: No results for input(s): CKTOTAL, CKMB, CKMBINDEX, TROPONINI in the last 168 hours. BNP: Invalid input(s): POCBNP CBG: Recent Labs  Lab 07/02/19 0002 07/02/19 0319 07/02/19 0810 07/02/19 1154 07/02/19 1616  GLUCAP 144* 124* 137* 104* 86   D-Dimer No results for input(s): DDIMER in the last 72 hours. Hgb A1c No results for input(s): HGBA1C in the last 72 hours. Lipid Profile No results for input(s): CHOL, HDL, LDLCALC, TRIG, CHOLHDL, LDLDIRECT in the last 72 hours. Thyroid function studies No results for input(s):  TSH, T4TOTAL, T3FREE, THYROIDAB in the last 72 hours.  Invalid input(s): FREET3 Anemia work  up Recent Labs    07/05/19 0350  FERRITIN 1,345*   Microbiology Recent Results (from the past 240 hour(s))  Culture, respiratory (non-expectorated)     Status: None   Collection Time: 07/01/19 12:32 PM   Specimen: Bronchoalveolar Lavage; Respiratory  Result Value Ref Range Status   Specimen Description   Final    BRONCHIAL ALVEOLAR LAVAGE Performed at [facility] [address], [place], [place] [address]    Special Requests   Final    NONE Performed at [facility], [place] [address] [place], [place] [address]    Gram Stain NO WBC SEEN NO ORGANISMS SEEN   Final   Culture   Final    Consistent with normal respiratory flora. Performed at [facility], [place] [address] [place], [place] [address]    Report Status 07/04/2019 FINAL  Final  MRSA PCR Screening     Status: None   Collection Time: 07/02/19 11:29 AM   Specimen: Nasal Mucosa; Nasopharyngeal  Result Value Ref Range Status   MRSA by PCR NEGATIVE NEGATIVE Final    Comment:        The GeneXpert MRSA Assay (FDA approved for NASAL specimens only), is one component of a comprehensive MRSA colonization surveillance program. It is not intended to diagnose MRSA infection nor to guide or monitor treatment for MRSA infections. Performed at [facility], [place] [address], [place], [place] [address]     Please note: You were cared for by a hospitalist during your hospital stay. Once you are discharged, your primary care physician will handle any further medical issues. Please note that NO REFILLS for any discharge medications will be authorized once you are discharged, as it is imperative that you return to your primary care physician (or establish a relationship with a primary care physician if you do not have one) for your post hospital discharge needs so that they can reassess your need for medications and monitor your lab values.  Signed: Marlowe Aschoff Ai Sonnenfeld  [facility] 07/07/2019, 1:09 PM

## 2019-07-07 NOTE — TOC Progression Note (Signed)
Transition of Care [facility]) - Progression Note    Patient Details  Name: Louis [name] MRN: [id number] Date of Birth: April 15, 1955  [facility] CM/SW Contact  Purcell Mouton, RN Phone Number: 07/07/2019, 1:29 PM  Clinical Narrative:    Pt discharging to CIR today.    Expected Discharge Plan: IP Rehab Facility Barriers to Discharge: Continued Medical Work up  Expected Discharge Plan and Services Expected Discharge Plan: [place]   Discharge Planning Services: CM Consult Post Acute Care Choice: IP Rehab Living arrangements for the past 2 months: Single Family Home Expected Discharge Date: 07/07/19                                     Social Determinants of Health (SDOH) Interventions    Readmission Risk Interventions No flowsheet data found.

## 2019-07-07 NOTE — Progress Notes (Signed)
Occupational Therapy Treatment Patient Details Name: JAYVIEN ROWLETTE MRN: [id number] DOB: 05-28-1955 Today's Date: 07/07/2019    History of present illness TERIK HAUGHEY is a 64 y.o. male with a history of scoliosis with restrictive and obstructive lung disease, chronic HFpEF, OSA and nocturnal hypoxia on CPAP and 2L O2, essential tremor, hepatic steatosis, and perforated duodenal ulcer s/p [name] Jan 2021 who presented to the ED with increasing falls and progressive weakness. He was found to be febrile to 101.64F and tachypneic though not hypoxemic. Work up included CXR with indistinct hazy left-sided opacity and subsequent CTA chest after d-dimer was 1.90 which had motion degradation without PE or focal consolidation. There were incidental findings including renal lesions for which abdominal MR was recommended.   OT comments  Patient progressing towards acute OT goals, min A initially with sit to stand from EOB and cues to correct hand placement as patient is pulling on walker vs pushing from bed. Patient progress to min guard with functional transfer to toilet then to recliner with increased safety. Min cue for safety with ambulation to bathroom as patient had walker on L sock pulling it off his foot. Pt supervision standing at sink to perform g/h. Will continue to follow.   Follow Up Recommendations  CIR;Supervision/Assistance - 24 hour    Equipment Recommendations  None recommended by OT       Precautions / Restrictions Precautions Precautions: Fall Restrictions Weight Bearing Restrictions: No       Mobility Bed Mobility Overal bed mobility: Needs Assistance Bed Mobility: Supine to Sit     Supine to sit: Min guard;HOB elevated     General bed mobility comments: heavy use of bed rail and HOB significantly elevated  Transfers Overall transfer level: Needs assistance Equipment used: Rolling walker (2 wheeled) Transfers: Sit to/from Stand Sit to Stand: Min guard;Min assist          General transfer comment: pt required cues for safe hand placement/technique with RW on first sti<>stand trying to pull up onto walker, increased safety with toilet transfer using grab bars    Balance Overall balance assessment: Needs assistance Sitting-balance support: Feet supported Sitting balance-Leahy Scale: Good     Standing balance support: During functional activity;Bilateral upper extremity supported Standing balance-Leahy Scale: Fair                             ADL either performed or assessed with clinical judgement   ADL Overall ADL's : Needs assistance/impaired     Grooming: Oral care;Wash/dry face;Wash/dry hands;Supervision/safety;Standing Grooming Details (indicate cue type and reason): Patient stood at sink to perform grooming task after ambulating to the bathroom with RW. Supervision for safety as patient is mildly tremulous in all extremities                  Toilet Transfer: Min guard;RW;Ambulation;Grab Information systems manager Details (indicate cue type and reason): patient able to stand up from lower surface using grab bar for assist          Functional mobility during ADLs: Min guard;Rolling walker;Cueing for safety General ADL Comments: min cues for safety awareness during treatment, pt did not observe walker pulling on L sock requiring assist to pull up                Cognition Arousal/Alertness: Awake/alert Behavior During Therapy: WFL for tasks assessed/performed Overall Cognitive Status: Within Functional Limits for tasks assessed  Pertinent Vitals/ Pain       Pain Assessment: Faces Faces Pain Scale: No hurt         Frequency  Min 2X/week        Progress Toward Goals  OT Goals(current goals can now be found in the care plan section)  Progress towards OT goals: Progressing toward goals  Acute Rehab OT Goals Patient  Stated Goal: I want to walk without any assistance OT Goal Formulation: With patient Time For Goal Achievement: 07/21/19 Potential to Achieve Goals: Good ADL Goals Pt Will Perform Grooming: with set-up;standing Pt Will Perform Upper Body Bathing: with set-up;sitting;standing Pt Will Perform Lower Body Bathing: with set-up;sitting/lateral leans;sit to/from stand Pt Will Transfer to Toilet: with supervision;ambulating;with modified independence;regular height toilet (walker) Pt Will Perform Toileting - Clothing Manipulation and hygiene: with supervision;with modified independence;sit to/from stand;sitting/lateral leans  Plan Discharge plan remains appropriate       AM-PAC OT "6 Clicks" Daily Activity     Outcome Measure   Help from another person eating meals?: None Help from another person taking care of personal grooming?: A Little Help from another person toileting, which includes using toliet, bedpan, or urinal?: A Little Help from another person bathing (including washing, rinsing, drying)?: A Little Help from another person to put on and taking off regular upper body clothing?: A Little Help from another person to put on and taking off regular lower body clothing?: A Lot 6 Click Score: 18    End of Session Equipment Utilized During Treatment: Rolling walker  OT Visit Diagnosis: Repeated falls (R29.6);Other abnormalities of gait and mobility (R26.89);Muscle weakness (generalized) (M62.81);Other symptoms and signs involving cognitive function;Pain   Activity Tolerance Patient tolerated treatment well   Patient Left in chair;with call bell/phone within reach   Nurse Communication Mobility status        Time: 3491-7915 OT Time Calculation (min): 17 min  Charges: OT General Charges $OT Visit: 1 Visit OT Treatments $Self Care/Home Management : 8-22 mins  Delbert Phenix OT Pager: [place] 07/07/2019, 12:34 PM

## 2019-07-07 NOTE — PMR Pre-admission (Signed)
PMR Admission Coordinator Pre-Admission Assessment  Patient: Louis [name] is an 64 y.o., male MRN: [id number] DOB: 07/23/1955 Height: _0  (180.3 cm) Weight: 96.9 kg  Insurance Information HMO:     PPO: yes     PCP:      IPA:      80/20:      OTHER:  PRIMARY: [employer]      Policy#: [id number]      Subscriber: pt CM Name: Luisa Hart      Phone#: [phone] ext [phone]     Fax#:  [phone] Pre-Cert#: [id number] [place] for CIR provided by Butch Penny with Christella Scheuermann, for admit 7/6 with updates due via fax to number listed above on 7/12.       Employer:  Benefits:  Phone #: [phone]     Name:  Eff. Date: 01/02/2008     Deduct: $1500 (met)      Out of Pocket Max: $6000 (met)      Life Max: n/a CIR: $250/admission, then 70%      SNF: 70% Outpatient:      Co-Pay: $50/visit Home Health: 70%      Co-Ins: 20% DME: 70%     Co-Ins: 30% Providers: preferred network   SECONDARY:       Policy#:      Phone#:   Development worker, community:       Phone#:   The Engineer, petroleum" for patients in Inpatient Rehabilitation Facilities with attached "Privacy Act [place] Records" was provided and verbally reviewed with: N/A  Emergency Contact Information Contact Information    Name Relation Home Work Mobile   Baskett,Lynn Spouse [phone]  [phone]      Current Medical History  Patient Admitting Diagnosis: debility  History of Present Illness: Pt is a 64 y/o male with PMH of scoliosis with restrictive and obstructive lung disease, chronic HFpEF, OSA, and nocturnal hypoxia (on CPAP), essential tremor, hepatic steatosis, and perforated duodenal ulcer s/p [name] 01/2019, [facility] to [facility] with increasing falls and progressive weakness.  Pt was febrile on admit (101.7 rectally) with reports of SOB and tachypnea.  Chest xray revealed hazy left-sided opacity with f/u CTA chest without PT, d-dimer 1.9.  Incidental finding of renal lesions.  CT head and cspine negative.   Metabolic derangements included hyponatremia  (125), hyperkalemia (5.8), SCr (1.99 from baseline of 1), and milt LFT elevations.  SARS-Cov-2 negative.  Urine micro showed many bacteria and hyaline casts without RBCs/WBCs.  Neurology was consulted and recommended MRI, which showed mild atrophy and non-specific cerebellar atrophy, and recommended further outpatient workup for tremors.  Infectious disease consult for enterococcal bacteremia with septic shock and end organ damage recommended IV ampicillin, MRI abdomen (negative), TEE (negative for vegetation), and follow blood cultures.  Pt persistently febrile while on IV ampicillin, so transitioned to IV daptomycin.  Intermittent bouts of delirium while in acute.  Pt developed respiratory arrest on the morning of 6/30 and was intubated.  Critical care consulted for management and pt was transferred to ICU.  Bronchopscopy was negative.  Pt was weaned from vent and extubated on 7/1.  Now saturating well on room air and mentation clearing.  Therapy evaluations were completed and pt was recommended for CIR.     [name] medical record from [facility] has been reviewed by the rehabilitation admission coordinator and physician.  Past Medical History  Past Medical History:  Diagnosis Date  . Anxiety    situational  . BPH (benign prostatic  hyperplasia)    on flomax  . Bundle branch block    per prior PCP records  . CHF (congestive heart failure) ([place])   . COPD (chronic obstructive pulmonary disease) ([place])   . Diverticulosis    by colonoscopy  . Eczema    per prior PCP records  . Elevated PSA 2015   peaked 6s, s/p benign biopsy 2014, sees urology [name] ([place])  . Essential tremor   . GERD (gastroesophageal reflux disease)    per prior PCP records  . History of panic attacks    per prior PCP records  . HTN (hypertension)   . Mild intermittent asthma in adult without complication   . Obesity, Class I, BMI 30-34.9   . Osteoporosis     DEXA 06/2013 with osteopenia - h/o ?wrist/hip fracture from AVN from chronic steroid use (asthma), took reclast for 1 year  . Renal artery stenosis in 1 of 2 vessels ([place]) 2011   by [place]  . Seasonal allergies   . Sleep apnea   . Thoracic scoliosis childhood  . Transaminitis    per prior PCP records    Family History   family history includes Alcohol abuse in his mother; Ataxia in his brother and sister; Cancer in his maternal grandmother; Cancer (age of onset: 66) in his father; Diabetes in his mother; Prostate cancer in his father; Stroke in his mother.  Prior Rehab/Hospitalizations Has the patient had prior rehab or hospitalizations prior to admission? Yes  Has the patient had major surgery during 100 days prior to admission? Yes   Current Medications  Current Facility-Administered Medications:  .  acetaminophen (TYLENOL) tablet 650 mg, 650 mg, Oral, Q6H PRN, Olalere, Adewale A, MD, 650 mg at 07/03/19 0745 .  ALPRAZolam Duanne Moron) tablet 0.5 mg, 0.5 mg, Oral, Daily PRN, Nahser, Wonda Cheng, MD, 0.5 mg at 06/30/19 2044 .  amLODipine (NORVASC) tablet 5 mg, 5 mg, Oral, Daily, Bell, Michelle T, RPH, 5 mg at 07/07/19 1020 .  Chlorhexidine Gluconate Cloth 2 % PADS 6 each, 6 each, Topical, Daily, Patrecia Pour, MD, 6 each at 07/07/19 1049 .  docusate sodium (COLACE) capsule 100 mg, 100 mg, Oral, BID, Eudelia Bunch, RPH, 100 mg at 07/04/19 1055 .  famotidine (PEPCID) tablet 20 mg, 20 mg, Oral, BID, Eudelia Bunch, RPH, 20 mg at 07/07/19 1020 .  feeding supplement (KATE FARMS STANDARD 1.4) liquid 325 mL, 325 mL, Oral, Daily, Dahal, Binaya, MD .  feeding supplement (PRO-STAT SUGAR FREE 64) liquid 30 mL, 30 mL, Oral, BID, Dahal, Binaya, MD, 30 mL at 07/07/19 1024 .  fentaNYL (SUBLIMAZE) injection 50 mcg, 50 mcg, Intravenous, Q15 min PRN, McQuaid, Douglas B, MD .  fentaNYL (SUBLIMAZE) injection 50-200 mcg, 50-200 mcg, Intravenous, Q30 min PRN, Simonne Maffucci B, MD, 100 mcg at 07/01/19 0757 .   folic acid (FOLVITE) tablet 1 mg, 1 mg, Oral, Daily, Nahser, Wonda Cheng, MD, 1 mg at 07/07/19 1020 .  gabapentin (NEURONTIN) capsule 300 mg, 300 mg, Oral, BID, Nahser, Wonda Cheng, MD, 300 mg at 07/07/19 1021 .  heparin injection 5,000 Units, 5,000 Units, Subcutaneous, Q8H, Nahser, Wonda Cheng, MD, 5,000 Units at 07/07/19 0539 .  ipratropium-albuterol (DUONEB) 0.5-2.5 (3) MG/3ML nebulizer solution 3 mL, 3 mL, Nebulization, Q6H PRN, Simonne Maffucci B, MD, 3 mL at 07/02/19 1617 .  linezolid (ZYVOX) tablet 600 mg, 600 mg, Oral, Q12H, Carlyle Basques, MD, 600 mg at 07/07/19 1019 .  losartan (COZAAR) tablet 100 mg, 100 mg, Oral, Daily, Dahal,  Marlowe Aschoff, MD, 100 mg at 07/07/19 1020 .  magnesium oxide (MAG-OX) tablet 400 mg, 400 mg, Oral, BID, Dahal, Binaya, MD, 400 mg at 07/07/19 1021 .  MEDLINE mouth rinse, 15 mL, Mouth Rinse, BID, Olalere, Adewale A, MD, 15 mL at 07/07/19 1021 .  metoprolol succinate (TOPROL-XL) 24 hr tablet 50 mg, 50 mg, Oral, Daily, Olalere, Adewale A, MD, 50 mg at 07/07/19 1021 .  midazolam (VERSED) injection 2 mg, 2 mg, Intravenous, Q15 min PRN, Simonne Maffucci B, MD, 2 mg at 07/01/19 0756 .  midazolam (VERSED) injection 2 mg, 2 mg, Intravenous, Q2H PRN, Simonne Maffucci B, MD .  multivitamin with minerals tablet 1 tablet, 1 tablet, Oral, Daily, Nahser, Wonda Cheng, MD, 1 tablet at 07/07/19 1019 .  ondansetron (ZOFRAN) tablet 4 mg, 4 mg, Oral, Q6H PRN **OR** ondansetron (ZOFRAN) injection 4 mg, 4 mg, Intravenous, Q6H PRN, Nahser, Wonda Cheng, MD .  phenol (CHLORASEPTIC) mouth spray 1 spray, 1 spray, Mouth/Throat, PRN, Olalere, Adewale A, MD .  sodium chloride flush (NS) 0.9 % injection 10-40 mL, 10-40 mL, Intracatheter, PRN, Vance Gather B, MD .  thiamine tablet 100 mg, 100 mg, Oral, Daily, Vance Gather B, MD, 100 mg at 07/07/19 1020 .  traMADol (ULTRAM) tablet 50 mg, 50 mg, Oral, Q12H PRN, Nahser, Wonda Cheng, MD, 50 mg at 07/03/19 1430 .  vitamin B-12 (CYANOCOBALAMIN) tablet 1,000 mcg, 1,000 mcg,  Oral, Daily, Dahal, Binaya, MD, 1,000 mcg at 07/07/19 1021  Patients Current Diet:  Diet Order            Diet regular Room service appropriate? Yes; Fluid consistency: Thin  Diet effective now                 Precautions / Restrictions Precautions Precautions: Fall Precaution Comments: watch O2 sats Restrictions Weight Bearing Restrictions: No Other Position/Activity Restrictions: per chart: WBAT in shoes with carbon plate inserts per wife   Has the patient had 2 or more falls or a fall with injury in the past year? Yes  Prior Activity Level Community (5-7x/wk): worked prior to admission as a Theme park manager, no DME used,   Prior Functional Level Self Care: Did the patient need help bathing, dressing, using the toilet or eating? Independent  Indoor Mobility: Did the patient need assistance with walking from room to room (with or without device)? Independent  Stairs: Did the patient need assistance with internal or external stairs (with or without device)? Independent  Functional Cognition: Did the patient need help planning regular tasks such as shopping or remembering to take medications? Independent  Home Assistive Devices / Equipment Home Assistive Devices/Equipment: CPAP, Walker (specify type), Oxygen, Other (Comment) Home Equipment: Walker - 2 wheels, Cane - single point, Crutches, Bedside commode, Hand held shower head  Prior Device Use: Indicate devices/aids used by the patient prior to current illness, exacerbation or injury? None of the above  Current Functional Level Cognition  Overall Cognitive Status: Within Functional Limits for tasks assessed Current Attention Level: Selective Orientation Level: Oriented X4 Following Commands: Follows one step commands consistently, Follows multi-step commands with increased time Safety/Judgement: Decreased awareness of safety General Comments: Continues to improve cognitively but still mild deficits noted - especially with short  term memory.    Extremity Assessment (includes Sensation/Coordination)  Upper Extremity Assessment: Generalized weakness (AROM generally WFL; Tremors BUE) RUE Deficits / Details: RUE 5/5 strength except 3+/5 grip strength, tremulous upper extremity and ataxic movement with finger to nose. RUE Coordination: decreased fine motor LUE Deficits /  Details: Shoulder strength 4/5, 5/5 elbow, 3+/5 grip strength, tremulous, ataxic LUE Coordination: decreased fine motor  Lower Extremity Assessment: Defer to PT evaluation (Recent R foot fx June 13th)    ADLs  Overall ADL's : Needs assistance/impaired Eating/Feeding: Set up, Sitting Grooming: Oral care, Wash/dry face, Wash/dry hands, Supervision/safety, Standing Grooming Details (indicate cue type and reason): Patient stood at sink to perform grooming task after ambulating to the bathroom with RW. Supervision for safety as patient is mildly tremulous in all extremities  Upper Body Bathing: Minimal assistance, Bed level, Set up Lower Body Bathing: Moderate assistance, Sit to/from stand Upper Body Dressing : Moderate assistance, Bed level Lower Body Dressing: Set up, Moderate assistance Lower Body Dressing Details (indicate cue type and reason): Mod assist to donn socks and shoes. Patient able to donn right socks. Therapist donned left sock and shoe. Patient able to to donn right shoe and sock. Therapist tied laces. Toilet Transfer: Min guard, RW, Ambulation, Grab bars, Regular Glass blower/designer Details (indicate cue type and reason): patient able to stand up from lower surface using grab bar for assist  Toileting- Clothing Manipulation and Hygiene: Maximal assistance Toileting - Clothing Manipulation Details (indicate cue type and reason): male external catheter Tub/Shower Transfer Details (indicate cue type and reason): n/a Functional mobility during ADLs: Min guard, Rolling walker, Cueing for safety General ADL Comments: min cues for safety  awareness during treatment, pt did not observe walker pulling on L sock requiring assist to pull up     Mobility  Overal bed mobility: Needs Assistance Bed Mobility: Supine to Sit Rolling: Max assist Sidelying to sit: Max assist Supine to sit: Min guard, HOB elevated Sit to supine: Max assist Sit to sidelying: Max assist General bed mobility comments: heavy use of bed rail and HOB significantly elevated    Transfers  Overall transfer level: Needs assistance Equipment used: Rolling walker (2 wheeled) Transfers: Sit to/from Stand Sit to Stand: Min guard, Min assist Stand pivot transfers: Min assist General transfer comment: pt required cues for safe hand placement/technique with RW on first sti<>stand trying to pull up onto walker, increased safety with toilet transfer using grab bars    Ambulation / Gait / Stairs / Wheelchair Mobility  Ambulation/Gait Ambulation/Gait assistance: +2 safety/equipment, Min assist Gait Distance (Feet): 140 Feet (2x) Assistive device: Rolling walker (2 wheeled) Gait Pattern/deviations: Step-to pattern, Decreased stride length, Shuffle, Trunk flexed General Gait Details: Min assist with ceus for safe step pattern/proximity to RW, and intermittent assist for walker management. chair follow for safety. pt HR elevated to 132 bpm max and SpO2 maintained between 92-95% on RA with gait. Pt has mild tremor noted in all extremities with mobility. Seated rest break halfway and pt able to ambulate bcak to room after ~3 mins rest. Gait velocity: decreased    Posture / Balance Dynamic Sitting Balance Sitting balance - Comments: pt able to don socks and shoes while sitting in recliner. pt able to tie shoes with some set up assist. Balance Overall balance assessment: Needs assistance Sitting-balance support: Feet supported Sitting balance-Leahy Scale: Good Sitting balance - Comments: pt able to don socks and shoes while sitting in recliner. pt able to tie shoes with  some set up assist. Postural control: Posterior lean, Right lateral lean Standing balance support: During functional activity, Bilateral upper extremity supported Standing balance-Leahy Scale: Fair Standing balance comment: R lateral lean    Special needs/care consideration CPAP and Designated visitor [place] (from acute  therapy documentation) Living Arrangements: Spouse/significant other Available Help at Discharge: Family, Available 24 hours/day Type of Home: House Home Access: Stairs to enter Entrance Stairs-Rails: Right Entrance Stairs-Number of Steps: 4 Bathroom Shower/Tub: Multimedia programmer: Handicapped height Bathroom Accessibility: Yes How Accessible: Accessible via walker [place]: No  Discharge Living Setting Plans for Discharge Living Setting: [name] home Type of Home at Discharge: House Discharge Home Layout: Able to live on main level with bedroom/bathroom Discharge Home Access: Stairs to enter Entrance Stairs-Rails: Right, Left (R HR in front, Bilat HR in back) Entrance Stairs-Number of Steps: 1+3+1 in front, 5 in back Discharge Bathroom Shower/Tub: Walk-in shower Discharge Bathroom Toilet: Standard Discharge Bathroom Accessibility: Yes How Accessible: Accessible via walker Does the patient have any problems obtaining your medications?: No  Social/Family/Support Systems Patient Roles: Spouse, Other (Comment) (pastor) Anticipated Caregiver: wife, Dr. 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 I discussed via phone for inpatient rehabilitation assessment.  Patient will benefit from ongoing PT and OT, can actively participate in 3 hours of therapy a day 5 days of the week, and can make measurable gains during the admission.  Patient will also benefit from the coordinated team approach during an Inpatient Acute Rehabilitation admission.  The patient will receive intensive therapy as well as Rehabilitation physician, nursing, social worker, and care management interventions.  Due to safety, skin/wound care, disease management, medication administration, pain management and patient education the patient requires 24 hour a day rehabilitation nursing.  The patient is currently min assist with mobility and basic ADLs.  Discharge setting and therapy post discharge at home is anticipated.  Patient has agreed to participate in the Acute Inpatient Rehabilitation Program and will admit today.  Preadmission Screen Completed By:  Michel Santee, PT, DPT 07/07/2019 12:34 PM ______________________________________________________________________   Discussed status with Dr. Ranell Patrick on 07/07/19  at 12:59 PM  and received approval for admission today.  Admission Coordinator:  Michel Santee, PT, DPT time 12:59 PM Sudie Grumbling 07/07/19    Assessment/Plan: Diagnosis: Debility following enterococcal bacteremia 1. Does the need for close, 24 hr/day Medical supervision in concert with the patient's rehab  needs make it unreasonable for this patient to be served in a less intensive setting? Yes 2. Co-Morbidities requiring supervision/potential complications: enterococcal bacteremia and UTI, healthcare associated pneumonia, acute hypoxemic hypercapnic respiratory failure, history of COPD, history of scoliosis, essential HTN, chronic heart failure with preserved ejection fraction, chronic macrocytic anemia, vitamin B12 deficiency, hypokalemia, hypomagnesemia, hypophosphatemia, AKI on CKD 2, history of obstructive sleep apnea, recent history of right foot metatarsal fracture 3. Due to bladder management, bowel management, safety, skin/wound care, disease management, medication administration, pain management  and patient education, does the patient require 24 hr/day rehab nursing? Yes 4. Does the patient require coordinated care of a physician, rehab nurse, PT, OT, and SLP to address physical and functional deficits in the context of the above medical diagnosis(es)? Yes Addressing deficits in the following areas: balance, endurance, locomotion, strength, transferring, bowel/bladder control, bathing, dressing, feeding, grooming, toileting and psychosocial support 5. Can the patient actively participate in an intensive therapy program of at least 3 hrs of therapy 5 days a week? Yes 6. The potential for patient to make measurable gains while on inpatient rehab is excellent 7. Anticipated functional outcomes upon discharge from inpatient rehab: modified independent PT, modified independent OT, independent SLP 8. Estimated rehab length of stay to reach the above functional goals is: 5-7 days 9. Anticipated discharge destination: Home 10. Overall Rehab/Functional Prognosis: excellent   MD Signature: Leeroy Cha, MD

## 2019-07-07 NOTE — Progress Notes (Signed)
Inpatient Rehabilitation Medication Review by a Pharmacist  A complete drug regimen review was completed for this patient to identify any potential clinically significant medication issues.  Clinically significant medication issues were identified:  yes   Type of Medication Issue Identified Description of Issue Urgent (address now) Non-Urgent (address on AM team rounds) Plan Plan Accepted by Provider? (Yes / No / Pending AM Rounds)  No Medication Administration End Date  Linezolid therapy to be completed 07/12/2019 Non-Urgent Stop date added per ID recommendations Yes (per protocol)  Other  New MgOx.  Recommend checking Mg level and adjusting dose if needed. Non-Urgent Order Mg level with AM labs Yes    Name of provider notified for urgent issues identified: N/A  Pharmacist comments: Med issues resolved.  Time spent performing this drug regimen review (minutes):  10 minutes   [employer], Pharm.D., BCPS Clinical Pharmacist 07/07/2019 4:36 PM

## 2019-07-07 NOTE — Progress Notes (Addendum)
Inpatient Rehab Admissions Coordinator:   Sent updated clinicals to insurance for authorization.  Awaiting determination for CIR auth.   Addendum: I have insurance authorization and Dr. Pietro Cassis in agreement for pt to admit to CIR today. I will let pt/family and TOC team know.   Shann Medal, PT, DPT Admissions Coordinator [phone] 07/07/19  10:52 AM

## 2019-07-07 NOTE — Progress Notes (Signed)
Heparin flush of central line ordered by mistake and not given via picc line before removal of picc

## 2019-07-07 NOTE — H&P (Signed)
Physical Medicine and Rehabilitation Admission H&P    Chief Complaint  Patient presents with  . Debility with functional deficits secondary to   . Enterococcal bacteremia and medical issues.     HPI: Louis Keidel. [name] is a 64 year old male with history of familial tremors, restrictive lung disease with scoliosis, perforated duodenal ulcer 01/2019, hepatic steatosis, moderate to excessive alcohol use, left foot lisfranc fracture with dislocation s/p ORIF 05/2018 with onset of neuropathy, B12 deficiency, gait disorder with history of falls who was admitted to [facility] on 06/25/19 with 2 week history of difficulty walking, confusion, SOB and multiple falls. He was found to have acute renal failure with hyperkalemia, hyponatremia and was febrile at admission.  Neurology and ID consulted for full work up which revealed sepsis due to enterococcus bacteremia.  Urine culture positive for enterococcus but patient denied symptoms.  CT head negative for acute changes. CTA chest negative for PE but showed severe hepatic steatosis and  1cm left upper pole renal lesion with recommendations for nonemergent abdominal MRI for work up. TEE was negative for vegetation and showed normal LVF with no valvular abnormality. He was started on ampicillin but continued to have fevers therefore antibiotics changed to daptomycin on 06/27 by Dr. Baxter Flattery.  MRI abdomen done and was negative for abdominal abscess and 1 cm benign hemorrhagic cyst noted in upper pole of left upper kidney.    He has had issues with delirium and waxing and waning of mental status with plans for CIR initially on 06/30 but developed acute hypoxemic hypercarbic respiratory failure with respiratory arrest due to acute pulmonary edema and required intubation. He was treated with IV diuresis and bronchoscopy done revealing severe bronchomalacia right lung particularly RLL with severe narrowing of RLL and RML orifices and mild LLL bronchomalacia. BAL showed inflammatory  infiltrate with neutrophil predominance and antibiotic coverage broadened to include HCAP.  . Due to concerns for daptomycin toxicity,  Dr. Baxter Flattery recommended change to antibiotics to Linzeloid  X 14 day with 06/28 as day #1.  BAL.  He tolerated extubation by 07/01 and has continued to have bouts of confusion and mild productive cough. BAL showed normal flora and Cefepime d/c on 07/03.  He has had issues with electrolyte abnormalities that are being supplemented. Macrocytic anemia of being monitored with H/H in 7.7- 8.7 range. He did have follow up X rays of right foot done 07/05 and c/w 06/15 reveal increased lucency of fracture without definite callus and no change in alinement.  Therapy ongoing with CAM boot on left foot and CIR recommended due to debility.    Review of Systems  Constitutional: Negative for chills and fever.  HENT: Negative for hearing loss and tinnitus.   Eyes: Negative for blurred vision.  Respiratory: Negative for cough and shortness of breath.   Cardiovascular: Negative for chest pain, palpitations and leg swelling.  Gastrointestinal: Positive for diarrhea. Negative for abdominal pain, heartburn, nausea and vomiting.  Genitourinary: Negative for dysuria and urgency.  Musculoskeletal: Positive for falls. Negative for back pain and myalgias.  Skin: Negative for itching and rash.  Neurological: Positive for tremors (bilateral hands and "travelled to the feet"), sensory change (can't feel his feet) and weakness. Negative for dizziness.  Psychiatric/Behavioral: Negative for memory loss. The patient is not nervous/anxious.      Past Medical History:  Diagnosis Date  . Anxiety    situational  . BPH (benign prostatic hyperplasia)    on flomax  . Bundle branch block  per prior PCP records  . CHF (congestive heart failure) ([place])   . COPD (chronic obstructive pulmonary disease) ([place])   . Diverticulosis    by colonoscopy  . Eczema    per prior PCP records  . Elevated PSA  2015   peaked 6s, s/p benign biopsy 2014, sees urology [name] ([place])  . Essential tremor   . GERD (gastroesophageal reflux disease)    per prior PCP records  . History of panic attacks    per prior PCP records  . HTN (hypertension)   . Mild intermittent asthma in adult without complication   . Obesity, Class I, BMI 30-34.9   . Osteoporosis    DEXA 06/2013 with osteopenia - h/o ?wrist/hip fracture from AVN from chronic steroid use (asthma), took reclast for 1 year  . Renal artery stenosis in 1 of 2 vessels ([place]) 2011   by [place]  . Seasonal allergies   . Sleep apnea   . Thoracic scoliosis childhood  . Transaminitis    per prior PCP records    Past Surgical History:  Procedure Laterality Date  . APPLICATION OF WOUND VAC Left 05/09/2018   Procedure: Application Of Wound Vac;  Surgeon: Newt Minion, MD;  Location: [place];  Service: Orthopedics;  Laterality: Left;  . BIOPSY  04/30/2019   Procedure: BIOPSY;  Surgeon: Juanita Craver, MD;  Location: [facility];  Service: Endoscopy;;  . COLONOSCOPY  12/2013   polyps, int hem, diverticulosis, rpt 5 yrs ([place])  . COLONOSCOPY WITH PROPOFOL N/A 04/30/2019   Procedure: COLONOSCOPY WITH PROPOFOL;  Surgeon: Juanita Craver, MD;  Location: [facility];  Service: Endoscopy;  Laterality: N/A;  . ELBOW SURGERY Right 2008   golfer's elbow  . ESOPHAGOGASTRODUODENOSCOPY (EGD) WITH PROPOFOL N/A 04/30/2019   Procedure: ESOPHAGOGASTRODUODENOSCOPY (EGD) WITH PROPOFOL;  Surgeon: Juanita Craver, MD;  Location: [facility];  Service: Endoscopy;  Laterality: N/A;  . INGUINAL HERNIA REPAIR Bilateral childhood & ~1995  . LAPAROTOMY N/A 01/05/2019   Procedure: EXPLORATORY LAPAROTOMY graham patch repair;  Surgeon: Benjamine Sprague, DO;  Location: [facility];  Service: General;  Laterality: N/A;  . NASAL SEPTUM SURGERY  2013   deviated septum  . ORIF ANKLE FRACTURE Left 05/09/2018   Procedure: OPEN REDUCTION INTERNAL FIXATION LEFT LISFRANC FRACTURE/DISLOCATION;  Surgeon: Newt Minion, MD;  Location: [facility];  Service: Orthopedics;  Laterality: Left;  . POLYPECTOMY  04/30/2019   Procedure: POLYPECTOMY;  Surgeon: Juanita Craver, MD;  Location: [facility];  Service: Endoscopy;;  . TEE WITHOUT CARDIOVERSION N/A 06/29/2019   Procedure: TRANSESOPHAGEAL ECHOCARDIOGRAM (TEE);  Surgeon: Acie Fredrickson Wonda Cheng, MD;  Location: [facility];  Service: Cardiovascular;  Laterality: N/A;  . US ECHOCARDIOGRAPHY  02/2009   WNL, EF >55% Claiborne [place])  . US RENAL/AORTA Right 05/2009   1-59% diameter reduction renal artery, rec rpt 2 yrs    Family History  Problem Relation Age of Onset  . Cancer Father 4       colon  . Prostate cancer Father   . Diabetes Mother   . Alcohol abuse Mother   . Stroke Mother   . Cancer Maternal Grandmother        pancreatic  . Ataxia Brother   . Ataxia Sister   . 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 He reports that he has never smoked. He has never used smokeless tobacco. He reports current alcohol use of about 14.0  standard drinks of alcohol per week. He reports that he does not use drugs.    Allergies  Allergen Reactions  . Accupril [Quinapril Hcl] Cough  . Nsaids     Perforated gastric ulcer    Medications Prior to Admission  Medication Sig Dispense Refill  . ALPRAZolam (XANAX) 0.5 MG tablet Take 1 tablet (0.5 mg total) by mouth daily as needed (essential tremor). 20 tablet 0  . Amino Acids-Protein Hydrolys (FEEDING SUPPLEMENT, PRO-STAT SUGAR FREE 64,) LIQD Take 30 mLs by mouth 2 (two) times daily. 887 mL 0  . amLODipine (NORVASC) 5 MG tablet TAKE 1 TABLET(5 MG) BY MOUTH DAILY (Patient taking differently: Take 5 mg by mouth daily. ) 90 tablet 2  . [START ON 03/03/4399] folic acid (FOLVITE) 1 MG tablet Take 1 tablet (1 mg total) by mouth daily.    [name] gabapentin (NEURONTIN) 300 MG capsule Take 300 mg by mouth 2 (two) times daily.    [name] linezolid (ZYVOX) 600 MG tablet Take 1 tablet (600  mg total) by mouth every 12 (twelve) hours for 5 days.    [name] losartan (COZAAR) 100 MG tablet Take 1 tablet (100 mg total) by mouth daily. NEED OV. 90 tablet 3  . magnesium oxide (MAG-OX) 400 (241.3 Mg) MG tablet Take 1 tablet (400 mg total) by mouth 2 (two) times daily.    Derrill Memo ON 07/08/2019] metoprolol succinate (TOPROL-XL) 50 MG 24 hr tablet Take 1 tablet (50 mg total) by mouth daily. Take with or immediately following a meal.    . [START ON 07/08/2019] Multiple Vitamin (MULTIVITAMIN WITH MINERALS) TABS tablet Take 1 tablet by mouth daily.    . Nutritional Supplements (FEEDING SUPPLEMENT, KATE FARMS STANDARD 1.4,) LIQD liquid Take 325 mLs by mouth daily.    Derrill Memo ON 07/08/2019] thiamine 100 MG tablet Take 1 tablet (100 mg total) by mouth daily.    . traMADol (ULTRAM) 50 MG tablet Take 1 tablet (50 mg total) by mouth every 12 (twelve) hours as needed (pain). 30 tablet   . [START ON 07/08/2019] vitamin B-12 1000 MCG tablet Take 1 tablet (1,000 mcg total) by mouth daily.      Drug Regimen Review  Drug regimen was reviewed and remains appropriate with no significant issues identified  Home: Home Living Family/patient expects to be discharged to:: Private residence Living Arrangements: Spouse/significant other Available Help at Discharge: Family, Available 24 hours/day Type of Home: House Home Access: Stairs to enter [employer] of Steps: 4 Entrance Stairs-Rails: Right Bathroom Shower/Tub: Multimedia programmer: Handicapped height Bathroom Accessibility: Yes Home Equipment: Environmental consultant - 2 wheels, Cane - single point, Crutches, Bedside commode, Hand held shower head   Functional History: Prior Function Level of Independence: Independent Comments: original fx May 2020 (L foot); Recent fall June 13th ( R metatarsal fx) - per Sharol Given can wear steel insert shoes for mobility  Functional Status:  Mobility: Bed Mobility Overal bed mobility: Needs Assistance Bed Mobility: Supine  to Sit Rolling: Max assist Sidelying to sit: Max assist Supine to sit: Min guard, HOB elevated Sit to supine: Max assist Sit to sidelying: Max assist General bed mobility comments: pt in recliner on arrival Transfers Overall transfer level: Needs assistance Equipment used: Rolling walker (2 wheeled) Transfers: Sit to/from Stand Sit to Stand: Min guard Stand pivot transfers: Min assist General transfer comment: pt able to recall hand placement and self assisted with UEs Ambulation/Gait Ambulation/Gait assistance: Min assist Gait Distance (Feet): 200 Feet Assistive device: Rolling walker (2  wheeled) Gait Pattern/deviations: Step-through pattern, Decreased stride length, Trunk flexed General Gait Details: verbal cues for posture and RW distance, able to ambulate without any rest breaks today, SpO2 92% on room air, HR 95 and BP 153/81 upon sitting in recliner Gait velocity: decreased  ADL: ADL Overall ADL's : Needs assistance/impaired Eating/Feeding: Set up, Sitting Grooming: Oral care, Wash/dry face, Wash/dry hands, Supervision/safety, Standing Grooming Details (indicate cue type and reason): Patient stood at sink to perform grooming task after ambulating to the bathroom with RW. Supervision for safety as patient is mildly tremulous in all extremities  Upper Body Bathing: Minimal assistance, Bed level, Set up Lower Body Bathing: Moderate assistance, Sit to/from stand Upper Body Dressing : Moderate assistance, Bed level Lower Body Dressing: Set up, Moderate assistance Lower Body Dressing Details (indicate cue type and reason): Mod assist to donn socks and shoes. Patient able to donn right socks. Therapist donned left sock and shoe. Patient able to to donn right shoe and sock. Therapist tied laces. Toilet Transfer: Min guard, RW, Ambulation, Grab bars, Regular Glass blower/designer Details (indicate cue type and reason): patient able to stand up from lower surface using grab bar for  assist  Toileting- Clothing Manipulation and Hygiene: Maximal assistance Toileting - Clothing Manipulation Details (indicate cue type and reason): male external catheter Tub/Shower Transfer Details (indicate cue type and reason): n/a Functional mobility during ADLs: Min guard, Rolling walker, Cueing for safety General ADL Comments: min cues for safety awareness during treatment, pt did not observe walker pulling on L sock requiring assist to pull up   Cognition: Cognition Overall Cognitive Status: Within Functional Limits for tasks assessed Orientation Level: Oriented X4 Cognition Arousal/Alertness: Awake/alert Behavior During Therapy: WFL for tasks assessed/performed Overall Cognitive Status: Within Functional Limits for tasks assessed Area of Impairment: Memory, Safety/judgement, Following commands Current Attention Level: Selective Memory: Decreased short-term memory Following Commands: Follows one step commands consistently, Follows multi-step commands with increased time Safety/Judgement: Decreased awareness of safety Awareness: Emergent Problem Solving: Slow processing General Comments: Continues to improve cognitively but still mild deficits noted - especially with short term memory.   Blood pressure 128/79, pulse 75, temperature 98 F (36.7 C), resp. rate 15, SpO2 96 %. Physical Exam  General: Alert and oriented x 3, No apparent distress HEENT: Head is normocephalic, atraumatic, PERRLA, EOMI, sclera anicteric, oral mucosa pink and moist, dentition intact, ext ear canals clear,  Neck: Supple without JVD or lymphadenopathy Heart: Reg rate and rhythm. No murmurs rubs or gallops Chest: CTA bilaterally without wheezes, rales, or rhonchi; no distress Abdomen: Soft, non-tender, non-distended, bowel sounds positive. Extremities: No clubbing, cyanosis, or edema. Pulses are 2+ Skin: Clean and intact without signs of breakdown Neuro: Speech clear and able to answer orientation  questions without difficulty. Intentional tremors BUE.  BLE ataxia noted.  5/5 strength throughout. Decreased sensation along soles of bilateral feet- otherwise intact.  Musculoskeletal: Left ankle with charcot abnormality --well healed old incision on foot. Right malleolus with edema. Right foot with evidence of collapse.   Psych: Pt's affect is appropriate. Pt is cooperative  Results for orders placed or performed during the hospital encounter of 06/25/19 (from the past 48 hour(s))  CBC with Differential/Platelet     Status: Abnormal   Collection Time: 07/06/19  3:50 AM  Result Value Ref Range   WBC 5.8 4.0 - 10.5 K/uL   RBC 2.28 (L) 4.22 - 5.81 MIL/uL   Hemoglobin 7.7 (L) 13.0 - 17.0 g/dL   HCT 23.9 (L) 39 -  52 %   MCV 104.8 (H) 80.0 - 100.0 fL   MCH 33.8 26.0 - 34.0 pg   MCHC 32.2 30.0 - 36.0 g/dL   RDW 14.1 11.5 - 15.5 %   Platelets 447 (H) 150 - 400 K/uL   nRBC 0.0 0.0 - 0.2 %   Neutrophils Relative % 55 %   Neutro Abs 3.2 1.7 - 7.7 K/uL   Lymphocytes Relative 23 %   Lymphs Abs 1.3 0.7 - 4.0 K/uL   Monocytes Relative 16 %   Monocytes Absolute 0.9 0 - 1 K/uL   Eosinophils Relative 4 %   Eosinophils Absolute 0.2 0 - 0 K/uL   Basophils Relative 0 %   Basophils Absolute 0.0 0 - 0 K/uL   Immature Granulocytes 2 %   Abs Immature Granulocytes 0.11 (H) 0.00 - 0.07 K/uL    Comment: Performed at [facility], [place] [address] [place], [place] [address]  Basic metabolic panel     Status: Abnormal   Collection Time: 07/06/19  3:50 AM  Result Value Ref Range   Sodium 134 (L) 135 - 145 mmol/L   Potassium 3.7 3.5 - 5.1 mmol/L   Chloride 98 98 - 111 mmol/L   CO2 28 22 - 32 mmol/L   Glucose, Bld 103 (H) 70 - 99 mg/dL    Comment: Glucose reference range applies only to samples taken after fasting for at least 8 hours.   BUN 14 8 - 23 mg/dL   Creatinine, Ser 0.86 0.61 - 1.24 mg/dL   Calcium 8.8 (L) 8.9 - 10.3 mg/dL   GFR calc non Af Amer >60 >60 mL/min   GFR calc Af  Amer >60 >60 mL/min   Anion gap 8 5 - 15    Comment: Performed at [facility], [place] [address] [place], [place] [address]  Magnesium     Status: Abnormal   Collection Time: 07/06/19  3:50 AM  Result Value Ref Range   Magnesium 1.6 (L) 1.7 - 2.4 mg/dL    Comment: Performed at [facility], [place] [address] [place], [place] [address]  Phosphorus     Status: None   Collection Time: 07/06/19  3:50 AM  Result Value Ref Range   Phosphorus 3.5 2.5 - 4.6 mg/dL    Comment: Performed at [facility], [place] [address] [place], [place] [address]  Type and screen [place]     Status: None   Collection Time: 07/06/19 11:46 AM  Result Value Ref Range   ABO/RH(D) O POS    Antibody Screen NEG    Sample Expiration 07/09/2019,2359    Unit Number [id number]    Blood Component Type RED CELLS,LR    Unit division 00    Status of Unit ISSUED,FINAL    Transfusion Status OK TO TRANSFUSE    Crossmatch Result      Compatible Performed at [facility], [place] [address] [place], [place] [address]   Prepare RBC (crossmatch)     Status: None   Collection Time: 07/06/19 11:46 AM  Result Value Ref Range   Order Confirmation      ORDER PROCESSED BY BLOOD BANK Performed at [facility], [place] [address] [place], [place] [address]   ABO/Rh     Status: None   Collection Time: 07/06/19 11:46 AM  Result Value Ref Range   ABO/RH(D)      O POS Performed at [facility],  [place] [address], [place], [place] [address]   Magnesium     Status: Abnormal   Collection Time: 07/07/19  4:44 AM  Result Value Ref Range   Magnesium 1.6 (L) 1.7 - 2.4 mg/dL    Comment: Performed at [facility], [place] [address], [place], [place] [address]  CBC with Differential/Platelet     Status: Abnormal   Collection Time: 07/07/19  4:44 AM  Result Value Ref Range   WBC 5.8 4.0 - 10.5  K/uL   RBC 2.61 (L) 4.22 - 5.81 MIL/uL   Hemoglobin 8.7 (L) 13.0 - 17.0 g/dL   HCT 26.6 (L) 39 - 52 %   MCV 101.9 (H) 80.0 - 100.0 fL   MCH 33.3 26.0 - 34.0 pg   MCHC 32.7 30.0 - 36.0 g/dL   RDW 15.3 11.5 - 15.5 %   Platelets 385 150 - 400 K/uL   nRBC 0.0 0.0 - 0.2 %   Neutrophils Relative % 55 %   Neutro Abs 3.2 1.7 - 7.7 K/uL   Lymphocytes Relative 24 %   Lymphs Abs 1.4 0.7 - 4.0 K/uL   Monocytes Relative 15 %   Monocytes Absolute 0.9 0 - 1 K/uL   Eosinophils Relative 4 %   Eosinophils Absolute 0.2 0 - 0 K/uL   Basophils Relative 1 %   Basophils Absolute 0.0 0 - 0 K/uL   Immature Granulocytes 1 %   Abs Immature Granulocytes 0.07 0.00 - 0.07 K/uL    Comment: Performed at [facility], [place] [address] [place], [place] [address]  Basic metabolic panel     Status: Abnormal   Collection Time: 07/07/19  4:44 AM  Result Value Ref Range   Sodium 134 (L) 135 - 145 mmol/L   Potassium 3.6 3.5 - 5.1 mmol/L   Chloride 100 98 - 111 mmol/L   CO2 26 22 - 32 mmol/L   Glucose, Bld 101 (H) 70 - 99 mg/dL    Comment: Glucose reference range applies only to samples taken after fasting for at least 8 hours.   BUN 14 8 - 23 mg/dL   Creatinine, Ser 0.80 0.61 - 1.24 mg/dL   Calcium 8.8 (L) 8.9 - 10.3 mg/dL   GFR calc non Af Amer >60 >60 mL/min   GFR calc Af Amer >60 >60 mL/min   Anion gap 8 5 - 15    Comment: Performed at [facility] [place] [address], [place], [place] [address]   DG Foot 2 Views Right  Result Date: 07/06/2019 CLINICAL DATA:  History of fracture the 5th metatarsal 2 weeks ago. EXAM: RIGHT FOOT - 2 VIEW COMPARISON:  06/16/2019 FINDINGS: Comminuted fracture of the base of the 5th metatarsal shows increased lucency at the fracture site. No definitive callus. No change in alignment. There is increased soft tissue swelling IMPRESSION: Increased lucency at the fracture site. No definitive callus. Electronically Signed   By: Nolon Nations M.D.   On:  07/06/2019 13:56       Medical Problem List and Plan: 1. Debility secondary to bacterial sepsis  -patient may shower  -ELOS/Goals: modI 5-7 days 2.  Antithrombotics: -DVT/anticoagulation:  Pharmaceutical: Heparin  -antiplatelet therapy: N/A 3. Pain Management:  Tramadol or Tylenol prn. Currently well controlled.  4. Mood:  LCSW to follow for evaluation and support.   -antipsychotic agents: N/A 5. Neuropsych: This patient appears to be capable of making decisions on his own behalf currently.  6. Skin/Wound Care: Routine pressure  relief measures.  7. Fluids/Electrolytes/Nutrition: continue supplements--will increase prostat to tid.   8. Enterococcal bacteremia: Continue Zyvox for 2 weeks with with end date 7/11. Reports loose stools today--will d/c colace.  9. Obstructive lung disease/underlying scoliosis: Pulmonary hygiene and encourage OOB activity.  10. Hypercarbic respiratory failure/OSA:  Nees to use CPAP whenever napping and at nights.  11. Macrocytic anemia/Vitamin B 12 deficiency: On  Vitamin B 12 supplement. H/H in 7-8 range  12.  Acute renal failure: Due to infection has resolved.  13. Chronic heart failure with preserved EF: Add low salt restrictions. Will monitor for signs of overload with daily weights and strict I/O. Continue metoprolol.  55. H/o "Excessive Alcohol use" /Hepatic steatosis: Abnormal LFTs resolving. Recheck in am.  15. Hypokalemia: Resolved with supplementation. Recheck in am.  16. Hypomagnesemia: Chronic due to malnutrition? Has had IV supplementation but still 1.6. Continue oral supplement--recheck labs in am.   17. Hypophosphatemia: Recheck levels in am.  18. Right 5th MT fx: WBAT with carbon enforced shoes plates.  19. Essential tremor: On Xanax 0.92m daily prn. Can consider starting propanolol. 20. HTN: Continue amlodipine 520mdaily.   PaReesa ChewPA-C  I have personally performed a face to face diagnostic evaluation, including, but not limited to  relevant history and physical exam findings, of this patient and developed relevant assessment and plan.  Additionally, I have reviewed and concur with the physician assistant's documentation above.  The patient's status has not changed. The original post admission physician evaluation remains appropriate, and any changes from the pre-admission screening or documentation from the acute chart are noted above.   KrIzora RibasMD 07/07/2019

## 2019-07-07 NOTE — Progress Notes (Signed)
PMR Admission Coordinator Pre-Admission Assessment  Patient: Louis [name] is an 64 y.o., male MRN: [id number] DOB: 12/22/1955 Height: 5' 11" (180.3 cm) Weight: 96.9 kg  Insurance Information HMO:     PPO: yes     PCP:      IPA:      80/20:      OTHER:  PRIMARY: [employer]      Policy#: [id number]      Subscriber: pt CM Name: Donna Hiscock      Phone#: [phone]     Fax#:  [phone] Pre-Cert#: [id number] auth for CIR provided by Donna with [employer], for admit 7/6 with updates due via fax to number listed above on 7/12.       Employer:  Benefits:  Phone #: [phone]     Name:  Eff. Date: 01/02/2008     Deduct: $1500 (met)      Out of Pocket Max: $6000 (met)      Life Max: n/a CIR: $250/admission, then 70%      SNF: 70% Outpatient:      Co-Pay: $50/visit Home Health: 70%      Co-Ins: 20% DME: 70%     Co-Ins: 30% Providers: preferred network   SECONDARY:       Policy#:      Phone#:   Financial Counselor:       Phone#:   The "Data Collection Information Summary" for patients in Inpatient Rehabilitation Facilities with attached "Privacy Act Statement-Health Care Records" was provided and verbally reviewed with: N/A  Emergency Contact Information Contact Information    Name Relation Home Work Mobile   Schirtzinger,Lynn Spouse [phone]  [phone]      Current Medical History  Patient Admitting Diagnosis: debility  History of Present Illness: Pt is a 64 y/o male with PMH of scoliosis with restrictive and obstructive lung disease, chronic HFpEF, OSA, and nocturnal hypoxia (on CPAP), essential tremor, hepatic steatosis, and perforated duodenal ulcer s/p Graham patch 01/2019, presented to [facility] on 6/24 with increasing falls and progressive weakness.  Pt was febrile on admit (101.7 rectally) with reports of SOB and tachypnea.  Chest xray revealed hazy left-sided opacity with f/u CTA chest without PT, d-dimer 1.9.  Incidental finding of renal lesions.  CT head and cspine negative.   Metabolic derangements included hyponatremia  (125), hyperkalemia (5.8), SCr (1.99 from baseline of 1), and milt LFT elevations.  SARS-Cov-2 negative.  Urine micro showed many bacteria and hyaline casts without RBCs/WBCs.  Neurology was consulted and recommended MRI, which showed mild atrophy and non-specific cerebellar atrophy, and recommended further outpatient workup for tremors.  Infectious disease consult for enterococcal bacteremia with septic shock and end organ damage recommended IV ampicillin, MRI abdomen (negative), TEE (negative for vegetation), and follow blood cultures.  Pt persistently febrile while on IV ampicillin, so transitioned to IV daptomycin.  Intermittent bouts of delirium while in acute.  Pt developed respiratory arrest on the morning of 6/30 and was intubated.  Critical care consulted for management and pt was transferred to ICU.  Bronchopscopy was negative.  Pt was weaned from vent and extubated on 7/1.  Now saturating well on room air and mentation clearing.  Therapy evaluations were completed and pt was recommended for CIR.     [name] medical record from [facility] has been reviewed by the rehabilitation admission coordinator and physician.  Past Medical History  Past Medical History:  Diagnosis Date  . Anxiety    situational  . BPH (benign prostatic   hyperplasia)    on flomax  . Bundle branch block    per prior PCP records  . CHF (congestive heart failure) (HCC)   . COPD (chronic obstructive pulmonary disease) (HCC)   . Diverticulosis    by colonoscopy  . Eczema    per prior PCP records  . Elevated PSA 2015   peaked 6s, s/p benign biopsy 2014, sees urology [facility] ([facility])  . Essential tremor   . GERD (gastroesophageal reflux disease)    per prior PCP records  . History of panic attacks    per prior PCP records  . HTN (hypertension)   . Mild intermittent asthma in adult without complication   . Obesity, Class I, BMI 30-34.9   . Osteoporosis     DEXA 06/2013 with osteopenia - h/o ?wrist/hip fracture from AVN from chronic steroid use (asthma), took reclast for 1 year  . Renal artery stenosis in 1 of 2 vessels (HCC) 2011   by US  . Seasonal allergies   . Sleep apnea   . Thoracic scoliosis childhood  . Transaminitis    per prior PCP records    Family History   family history includes Alcohol abuse in his mother; Ataxia in his brother and sister; Cancer in his maternal grandmother; Cancer (age of onset: 70) in his father; Diabetes in his mother; Prostate cancer in his father; Stroke in his mother.  Prior Rehab/Hospitalizations Has the patient had prior rehab or hospitalizations prior to admission? Yes  Has the patient had major surgery during 100 days prior to admission? Yes   Current Medications  Current Facility-Administered Medications:  .  acetaminophen (TYLENOL) tablet 650 mg, 650 mg, Oral, Q6H PRN, Olalere, Adewale A, MD, 650 mg at 07/03/19 0745 .  ALPRAZolam (XANAX) tablet 0.5 mg, 0.5 mg, Oral, Daily PRN, Nahser, Philip J, MD, 0.5 mg at 06/30/19 2044 .  amLODipine (NORVASC) tablet 5 mg, 5 mg, Oral, Daily, Bell, Michelle T, RPH, 5 mg at 07/07/19 1020 .  Chlorhexidine Gluconate Cloth 2 % PADS 6 each, 6 each, Topical, Daily, Grunz, Ryan B, MD, 6 each at 07/07/19 1049 .  docusate sodium (COLACE) capsule 100 mg, 100 mg, Oral, BID, Bell, Michelle T, RPH, 100 mg at 07/04/19 1055 .  famotidine (PEPCID) tablet 20 mg, 20 mg, Oral, BID, Bell, Michelle T, RPH, 20 mg at 07/07/19 1020 .  feeding supplement (KATE FARMS STANDARD 1.4) liquid 325 mL, 325 mL, Oral, Daily, Dahal, Binaya, MD .  feeding supplement (PRO-STAT SUGAR FREE 64) liquid 30 mL, 30 mL, Oral, BID, Dahal, Binaya, MD, 30 mL at 07/07/19 1024 .  fentaNYL (SUBLIMAZE) injection 50 mcg, 50 mcg, Intravenous, Q15 min PRN, McQuaid, Douglas B, MD .  fentaNYL (SUBLIMAZE) injection 50-200 mcg, 50-200 mcg, Intravenous, Q30 min PRN, McQuaid, Douglas B, MD, 100 mcg at 07/01/19 0757 .   folic acid (FOLVITE) tablet 1 mg, 1 mg, Oral, Daily, Nahser, Philip J, MD, 1 mg at 07/07/19 1020 .  gabapentin (NEURONTIN) capsule 300 mg, 300 mg, Oral, BID, Nahser, Philip J, MD, 300 mg at 07/07/19 1021 .  heparin injection 5,000 Units, 5,000 Units, Subcutaneous, Q8H, Nahser, Philip J, MD, 5,000 Units at 07/07/19 0539 .  ipratropium-albuterol (DUONEB) 0.5-2.5 (3) MG/3ML nebulizer solution 3 mL, 3 mL, Nebulization, Q6H PRN, McQuaid, Douglas B, MD, 3 mL at 07/02/19 1617 .  linezolid (ZYVOX) tablet 600 mg, 600 mg, Oral, Q12H, Snider, Cynthia, MD, 600 mg at 07/07/19 1019 .  losartan (COZAAR) tablet 100 mg, 100 mg, Oral, Daily, Dahal,   Binaya, MD, 100 mg at 07/07/19 1020 .  magnesium oxide (MAG-OX) tablet 400 mg, 400 mg, Oral, BID, Dahal, Binaya, MD, 400 mg at 07/07/19 1021 .  MEDLINE mouth rinse, 15 mL, Mouth Rinse, BID, Olalere, Adewale A, MD, 15 mL at 07/07/19 1021 .  metoprolol succinate (TOPROL-XL) 24 hr tablet 50 mg, 50 mg, Oral, Daily, Olalere, Adewale A, MD, 50 mg at 07/07/19 1021 .  midazolam (VERSED) injection 2 mg, 2 mg, Intravenous, Q15 min PRN, McQuaid, Douglas B, MD, 2 mg at 07/01/19 0756 .  midazolam (VERSED) injection 2 mg, 2 mg, Intravenous, Q2H PRN, McQuaid, Douglas B, MD .  multivitamin with minerals tablet 1 tablet, 1 tablet, Oral, Daily, Nahser, Philip J, MD, 1 tablet at 07/07/19 1019 .  ondansetron (ZOFRAN) tablet 4 mg, 4 mg, Oral, Q6H PRN **OR** ondansetron (ZOFRAN) injection 4 mg, 4 mg, Intravenous, Q6H PRN, Nahser, Philip J, MD .  phenol (CHLORASEPTIC) mouth spray 1 spray, 1 spray, Mouth/Throat, PRN, Olalere, Adewale A, MD .  sodium chloride flush (NS) 0.9 % injection 10-40 mL, 10-40 mL, Intracatheter, PRN, Grunz, Ryan B, MD .  thiamine tablet 100 mg, 100 mg, Oral, Daily, Grunz, Ryan B, MD, 100 mg at 07/07/19 1020 .  traMADol (ULTRAM) tablet 50 mg, 50 mg, Oral, Q12H PRN, Nahser, Philip J, MD, 50 mg at 07/03/19 1430 .  vitamin B-12 (CYANOCOBALAMIN) tablet 1,000 mcg, 1,000 mcg,  Oral, Daily, Dahal, Binaya, MD, 1,000 mcg at 07/07/19 1021  Patients Current Diet:  Diet Order            Diet regular Room service appropriate? Yes; Fluid consistency: Thin  Diet effective now                 Precautions / Restrictions Precautions Precautions: Fall Precaution Comments: watch O2 sats Restrictions Weight Bearing Restrictions: No Other Position/Activity Restrictions: per chart: WBAT in shoes with carbon plate inserts per wife   Has the patient had 2 or more falls or a fall with injury in the past year? 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 Independent  Indoor Mobility: Did the patient need assistance with walking from room to room (with or without device)? Independent  Stairs: Did the patient need assistance with internal or external stairs (with or without device)? Independent  Functional Cognition: Did the patient need help planning regular tasks such as shopping or remembering to take medications? Independent  Home Assistive Devices / Equipment Home Assistive Devices/Equipment: CPAP, Walker (specify type), Oxygen, Other (Comment) Home Equipment: Walker - 2 wheels, Cane - single point, Crutches, Bedside commode, Hand held shower head  Prior Device Use: Indicate devices/aids used by the patient prior to current illness, exacerbation or injury? None of the above  Current Functional Level Cognition  Overall Cognitive Status: Within Functional Limits for tasks assessed Current Attention Level: Selective Orientation Level: Oriented X4 Following Commands: Follows one step commands consistently, Follows multi-step commands with increased time Safety/Judgement: Decreased awareness of safety General Comments: Continues to improve cognitively but still mild deficits noted - especially with short  term memory.    Extremity Assessment (includes Sensation/Coordination)  Upper Extremity Assessment: Generalized weakness (AROM generally WFL; Tremors BUE) RUE Deficits / Details: RUE 5/5 strength except 3+/5 grip strength, tremulous upper extremity and ataxic movement with finger to nose. RUE Coordination: decreased fine motor LUE Deficits /   Details: Shoulder strength 4/5, 5/5 elbow, 3+/5 grip strength, tremulous, ataxic LUE Coordination: decreased fine motor  Lower Extremity Assessment: Defer to PT evaluation (Recent R foot fx June 13th)    ADLs  Overall ADL's : Needs assistance/impaired Eating/Feeding: Set up, Sitting Grooming: Oral care, Wash/dry face, Wash/dry hands, Supervision/safety, Standing Grooming Details (indicate cue type and reason): Patient stood at sink to perform grooming task after ambulating to the bathroom with RW. Supervision for safety as patient is mildly tremulous in all extremities  Upper Body Bathing: Minimal assistance, Bed level, Set up Lower Body Bathing: Moderate assistance, Sit to/from stand Upper Body Dressing : Moderate assistance, Bed level Lower Body Dressing: Set up, Moderate assistance Lower Body Dressing Details (indicate cue type and reason): Mod assist to donn socks and shoes. Patient able to donn right socks. Therapist donned left sock and shoe. Patient able to to donn right shoe and sock. Therapist tied laces. Toilet Transfer: Min guard, RW, Ambulation, Grab bars, Regular Toilet Toilet Transfer Details (indicate cue type and reason): patient able to stand up from lower surface using grab bar for assist  Toileting- Clothing Manipulation and Hygiene: Maximal assistance Toileting - Clothing Manipulation Details (indicate cue type and reason): male external catheter Tub/Shower Transfer Details (indicate cue type and reason): n/a Functional mobility during ADLs: Min guard, Rolling walker, Cueing for safety General ADL Comments: min cues for safety  awareness during treatment, pt did not observe walker pulling on L sock requiring assist to pull up     Mobility  Overal bed mobility: Needs Assistance Bed Mobility: Supine to Sit Rolling: Max assist Sidelying to sit: Max assist Supine to sit: Min guard, HOB elevated Sit to supine: Max assist Sit to sidelying: Max assist General bed mobility comments: heavy use of bed rail and HOB significantly elevated    Transfers  Overall transfer level: Needs assistance Equipment used: Rolling walker (2 wheeled) Transfers: Sit to/from Stand Sit to Stand: Min guard, Min assist Stand pivot transfers: Min assist General transfer comment: pt required cues for safe hand placement/technique with RW on first sti<>stand trying to pull up onto walker, increased safety with toilet transfer using grab bars    Ambulation / Gait / Stairs / Wheelchair Mobility  Ambulation/Gait Ambulation/Gait assistance: +2 safety/equipment, Min assist Gait Distance (Feet): 140 Feet (2x) Assistive device: Rolling walker (2 wheeled) Gait Pattern/deviations: Step-to pattern, Decreased stride length, Shuffle, Trunk flexed General Gait Details: Min assist with ceus for safe step pattern/proximity to RW, and intermittent assist for walker management. chair follow for safety. pt HR elevated to 132 bpm max and SpO2 maintained between 92-95% on RA with gait. Pt has mild tremor noted in all extremities with mobility. Seated rest break halfway and pt able to ambulate bcak to room after ~3 mins rest. Gait velocity: decreased    Posture / Balance Dynamic Sitting Balance Sitting balance - Comments: pt able to don socks and shoes while sitting in recliner. pt able to tie shoes with some set up assist. Balance Overall balance assessment: Needs assistance Sitting-balance support: Feet supported Sitting balance-Leahy Scale: Good Sitting balance - Comments: pt able to don socks and shoes while sitting in recliner. pt able to tie shoes with  some set up assist. Postural control: Posterior lean, Right lateral lean Standing balance support: During functional activity, Bilateral upper extremity supported Standing balance-Leahy Scale: Fair Standing balance comment: R lateral lean    Special needs/care consideration CPAP and Designated visitor Lynn Herberger   Previous Home Environment (from acute   therapy documentation) Living Arrangements: Spouse/significant other Available Help at Discharge: Family, Available 24 hours/day Type of Home: House Home Access: Stairs to enter Entrance Stairs-Rails: Right Entrance Stairs-Number of Steps: 4 Bathroom Shower/Tub: Walk-in shower Bathroom Toilet: Handicapped height Bathroom Accessibility: Yes How Accessible: Accessible via walker Home Care Services: No  Discharge Living Setting Plans for Discharge Living Setting: [name] home Type of Home at Discharge: House Discharge Home Layout: Able to live on main level with bedroom/bathroom Discharge Home Access: Stairs to enter Entrance Stairs-Rails: Right, Left (R HR in front, Bilat HR in back) Entrance Stairs-Number of Steps: 1+3+1 in front, 5 in back Discharge Bathroom Shower/Tub: Walk-in shower Discharge Bathroom Toilet: Standard Discharge Bathroom Accessibility: Yes How Accessible: Accessible via walker Does the patient have any problems obtaining your medications?: No  Social/Family/Support Systems Patient Roles: Spouse, Other (Comment) (pastor) Anticipated Caregiver: wife, Dr. 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 I discussed via phone for inpatient rehabilitation assessment.  Patient will benefit from ongoing PT and OT, can actively participate in 3 hours of therapy a day 5 days of the week, and can make measurable gains during the admission.  Patient will also benefit from the coordinated team approach during an Inpatient Acute Rehabilitation admission.  The patient will receive intensive therapy as well as Rehabilitation physician, nursing, social worker, and care management interventions.  Due to safety, skin/wound care, disease management, medication administration, pain management and patient education the patient requires 24 hour a day rehabilitation nursing.  The patient is currently min assist with mobility and basic ADLs.  Discharge setting and therapy post discharge at home is anticipated.  Patient has agreed to participate in the Acute Inpatient Rehabilitation Program and will admit today.  Preadmission Screen Completed By:  Oscar Hank E Ernie Sagrero, PT, DPT 07/07/2019 12:34 PM ______________________________________________________________________   Discussed status with Dr. Raulkar on 07/07/19  at 12:59 PM  and received approval for admission today.  Admission Coordinator:  Xcaret Morad E Ralonda Tartt, PT, DPT time 12:59 PM /Date 07/07/19    Assessment/Plan: Diagnosis: Debility following enterococcal bacteremia 1. Does the need for close, 24 hr/day Medical supervision in concert with the patient's rehab  needs make it unreasonable for this patient to be served in a less intensive setting? Yes 2. Co-Morbidities requiring supervision/potential complications: enterococcal bacteremia and UTI, healthcare associated pneumonia, acute hypoxemic hypercapnic respiratory failure, history of COPD, history of scoliosis, essential HTN, chronic heart failure with preserved ejection fraction, chronic macrocytic anemia, vitamin B12 deficiency, hypokalemia, hypomagnesemia, hypophosphatemia, AKI on CKD 2, history of obstructive sleep apnea, recent history of right foot metatarsal fracture 3. Due to bladder management, bowel management, safety, skin/wound care, disease management, medication administration, pain management   and patient education, does the patient require 24 hr/day rehab nursing? Yes 4. Does the patient require coordinated care of a physician, rehab nurse, PT, OT, and SLP to address physical and functional deficits in the context of the above medical diagnosis(es)? Yes Addressing deficits in the following areas: balance, endurance, locomotion, strength, transferring, bowel/bladder control, bathing, dressing, feeding, grooming, toileting and psychosocial support 5. Can the patient actively participate in an intensive therapy program of at least 3 hrs of therapy 5 days a week? Yes 6. The potential for patient to make measurable gains while on inpatient rehab is excellent 7. Anticipated functional outcomes upon discharge from inpatient rehab: modified independent PT, modified independent OT, independent SLP 8. Estimated rehab length of stay to reach the above functional goals is: 5-7 days 9. Anticipated discharge destination: Home 10. Overall Rehab/Functional Prognosis: excellent   MD Signature: Krutika Raulkar, MD 

## 2019-07-07 NOTE — Progress Notes (Signed)
Nutrition Follow-up  DOCUMENTATION CODES:   Not applicable  INTERVENTION:  - continue 30 ml prostat BID. - will order [employer] once/day, each supplement provides 455 kcal and 20 grams protein.   NUTRITION DIAGNOSIS:   Increased nutrient needs related to acute illness as evidenced by estimated needs. -revised; ongoing  GOAL:   Patient will meet greater than or equal to 90% of their needs -improving  MONITOR:   PO intake, Supplement acceptance, Labs, Weight trends  ASSESSMENT:   Pt presented with increasing falls and progressive weakness. Pt with Enterococcal bacteremia and ARF. PMH includes scoliosis with restrictive and obstructive lung disease, chronic HFpEF, OSA and nocturnal hypoxia on CPAP, essential tremor, hepatic steatosis, and perforated duodenal ulcer s/p [name] (Jan 2021).  Patient now extubated and moved from 2W to 4W. Diet advanced from NPO to Regular on 7/1. Per flow sheet documentation, on 7/4 patient ate 100% of breakfast and lunch (total of 1055 kcal, 46 grams protein); 100% of all meals 7/5 (total of 1782 kcal, 65 grams protein). He has been accepting prostat 100% of the time offered.  Notes state that patient is medically stable pending bed at CIR to be available for d/c.    Labs reviewed; Na: 134 mmol/l, Mg: 1.6 mg/dl. Medications reviewed; 100 mg colace BID, 1 mg folvite/day, 2 g IV Mg sulfate x1 run 7/6, 1 tablet multivitamin with minerals/day, 100 mg oral thiamine/day, 1000 mcg oral cyanocobalamin/day.    Diet Order:   Diet Order            Diet regular Room service appropriate? Yes; Fluid consistency: Thin  Diet effective now                 EDUCATION NEEDS:   No education needs have been identified at this time  Skin:  Skin Assessment: Reviewed RN Assessment  Last BM:  7/3  Height:   Ht Readings from Last 1 Encounters:  07/01/19 5\' 11"  (1.803 m)    Weight:   Wt Readings from Last 1 Encounters:  07/07/19 96.9 kg      Estimated Nutritional Needs:  Kcal:  2100-2300 kcal Protein:  105-115 grams Fluid:  >/= 2.2 L/day     Jarome Matin, MS, RD, LDN, CNSC Inpatient Clinical Dietitian RD pager # available in [facility]  After hours/weekend pager # available in [facility]

## 2019-07-08 ENCOUNTER — Inpatient Hospital Stay (HOSPITAL_COMMUNITY): Payer: 59 | Admitting: Occupational Therapy

## 2019-07-08 ENCOUNTER — Inpatient Hospital Stay (HOSPITAL_COMMUNITY): Payer: 59

## 2019-07-08 ENCOUNTER — Inpatient Hospital Stay (HOSPITAL_COMMUNITY): Payer: 59 | Admitting: Physical Therapy

## 2019-07-08 LAB — CBC WITH DIFFERENTIAL/PLATELET
Abs Immature Granulocytes: 0.04 10*3/uL (ref 0.00–0.07)
Basophils Absolute: 0.1 10*3/uL (ref 0.0–0.1)
Basophils Relative: 1 %
Eosinophils Absolute: 0.3 10*3/uL (ref 0.0–0.5)
Eosinophils Relative: 4 %
HCT: 29.3 % — ABNORMAL LOW (ref 39.0–52.0)
Hemoglobin: 9.5 g/dL — ABNORMAL LOW (ref 13.0–17.0)
Immature Granulocytes: 1 %
Lymphocytes Relative: 24 %
Lymphs Abs: 1.4 10*3/uL (ref 0.7–4.0)
MCH: 33.2 pg (ref 26.0–34.0)
MCHC: 32.4 g/dL (ref 30.0–36.0)
MCV: 102.4 fL — ABNORMAL HIGH (ref 80.0–100.0)
Monocytes Absolute: 0.9 10*3/uL (ref 0.1–1.0)
Monocytes Relative: 14 %
Neutro Abs: 3.3 10*3/uL (ref 1.7–7.7)
Neutrophils Relative %: 56 %
Platelets: 438 10*3/uL — ABNORMAL HIGH (ref 150–400)
RBC: 2.86 MIL/uL — ABNORMAL LOW (ref 4.22–5.81)
RDW: 14.6 % (ref 11.5–15.5)
WBC: 5.9 10*3/uL (ref 4.0–10.5)
nRBC: 0 % (ref 0.0–0.2)

## 2019-07-08 LAB — COMPREHENSIVE METABOLIC PANEL
ALT: 40 U/L (ref 0–44)
AST: 35 U/L (ref 15–41)
Albumin: 2.6 g/dL — ABNORMAL LOW (ref 3.5–5.0)
Alkaline Phosphatase: 54 U/L (ref 38–126)
Anion gap: 9 (ref 5–15)
BUN: 9 mg/dL (ref 8–23)
CO2: 27 mmol/L (ref 22–32)
Calcium: 9.4 mg/dL (ref 8.9–10.3)
Chloride: 99 mmol/L (ref 98–111)
Creatinine, Ser: 0.8 mg/dL (ref 0.61–1.24)
GFR calc Af Amer: >60 mL/min (ref >60)
GFR calc non Af Amer: >60 mL/min (ref >60)
Glucose, Bld: 99 mg/dL (ref 70–99)
Potassium: 3.9 mmol/L (ref 3.5–5.1)
Sodium: 135 mmol/L (ref 135–145)
Total Bilirubin: 0.6 mg/dL (ref 0.3–1.2)
Total Protein: 6.3 g/dL — ABNORMAL LOW (ref 6.5–8.1)

## 2019-07-08 LAB — MAGNESIUM: Magnesium: 1.6 mg/dL — ABNORMAL LOW (ref 1.7–2.4)

## 2019-07-08 LAB — PHOSPHORUS: Phosphorus: 4.3 mg/dL (ref 2.5–4.6)

## 2019-07-08 NOTE — Progress Notes (Signed)
Inpatient Rehabilitation Center Individual Statement of Services  Patient Name:  Louis [name]  Date:  07/08/2019  Welcome to the [facility].  Our goal is to provide you with an individualized program based on your diagnosis and situation, designed to meet your specific needs.  With this comprehensive rehabilitation program, you will be expected to participate in at least 3 hours of rehabilitation therapies Monday-Friday, with modified therapy programming on the weekends.  Your rehabilitation program will include the following services:  Physical Therapy (PT), Occupational Therapy (OT), Speech Therapy (ST), 24 hour per day rehabilitation nursing, Therapeutic Recreaction (TR), Neuropsychology, Care Coordinator, Rehabilitation Medicine, Nutrition Services, Pharmacy Services and Other  Weekly team conferences will be held on Wednesdays to discuss your progress.  Your Inpatient Rehabilitation Care Coordinator will talk with you frequently to get your input and to update you on team discussions.  Team conferences with you and your family in attendance may also be held.  Expected length of stay: 7-10 Days  Overall anticipated outcome: MOD I  Depending on your progress and recovery, your program may change. Your Inpatient Rehabilitation Care Coordinator will coordinate services and will keep you informed of any changes. Your Inpatient Rehabilitation Care Coordinator's name and contact numbers are listed  below.  The following services may also be recommended but are not provided by the [facility]:    [place] will be made to provide these services after discharge if needed.  Arrangements include referral to agencies that provide these services.  Your insurance has been verified to be:  [place]  Your primary doctor is:  Shelva Majestic, MD  Pertinent information will be shared with  your doctor and your insurance company.  Inpatient Rehabilitation Care Coordinator:  Erlene Quan, [place] or [phone]  Information discussed with and copy given to patient by: [name], 07/08/2019, 8:49 AM

## 2019-07-08 NOTE — Progress Notes (Addendum)
[place] PHYSICAL MEDICINE & REHABILITATION PROGRESS NOTE   Subjective/Complaints: Discussed issues with pt's spouse.  Reviewed xray result No foot pain during OT eval  Objective:   DG Foot 2 Views Right  Result Date: 07/06/2019 CLINICAL DATA:  History of fracture the 5th metatarsal 2 weeks ago. EXAM: RIGHT FOOT - 2 VIEW COMPARISON:  06/16/2019 FINDINGS: Comminuted fracture of the base of the 5th metatarsal shows increased lucency at the fracture site. No definitive callus. No change in alignment. There is increased soft tissue swelling IMPRESSION: Increased lucency at the fracture site. No definitive callus. Electronically Signed   By: Nolon Nations M.D.   On: 07/06/2019 13:56   Recent Labs    07/07/19 0444 07/08/19 0538  WBC 5.8 5.9  HGB 8.7* 9.5*  HCT 26.6* 29.3*  PLT 385 438*   Recent Labs    07/07/19 0444 07/08/19 0538  NA 134* 135  K 3.6 3.9  CL 100 99  CO2 26 27  GLUCOSE 101* 99  BUN 14 9  CREATININE 0.80 0.80  CALCIUM 8.8* 9.4    Intake/Output Summary (Last 24 hours) at 07/08/2019 0943 Last data filed at 07/08/2019 0730 Gross per 24 hour  Intake 600 ml  Output 2925 ml  Net -2325 ml     Physical Exam: Vital Signs Blood pressure 129/81, pulse 78, temperature 98.3 F (36.8 C), resp. rate 15, weight 92.9 kg, SpO2 95 %.  General: No acute distress Mood and affect are appropriate Heart: Regular rate and rhythm no rubs murmurs or extra sounds Lungs: Clear to auscultation, breathing unlabored, no rales or wheezes Abdomen: Positive bowel sounds, soft nontender to palpation, nondistended Extremities: No clubbing, cyanosis, or edema Skin: No evidence of breakdown, no evidence of rash Neurologic: Cranial nerves II through XII intact, motor strength is 4/5 in bilateral deltoid, bicep, tricep, grip, hip flexor, knee extensors, ankle dorsiflexor and plantar flexor Tremor mild , no ataxia or asterixis Musculoskeletal: Full range of motion in all 4 extremities.     Assessment/Plan: 1. Functional deficits secondary to debility Urosepsis which require 3+ hours per day of interdisciplinary therapy in a comprehensive inpatient rehab setting.  Physiatrist is providing close team supervision and 24 hour management of active medical problems listed below.  Physiatrist and rehab team continue to assess barriers to discharge/monitor patient progress toward functional and medical goals  Care Tool:  Bathing    Body parts bathed by patient: Right arm, Left arm, Chest, Abdomen, Front perineal area, Buttocks, Right upper leg, Left upper leg, Left lower leg, Right lower leg, Face         Bathing assist Assist Level: Minimal Assistance - Patient > 75%     Upper Body Dressing/Undressing Upper body dressing   What is the patient wearing?: Pull over shirt    Upper body assist Assist Level: Set up assist    Lower Body Dressing/Undressing Lower body dressing      What is the patient wearing?: Pants, Underwear/pull up     Lower body assist Assist for lower body dressing: Minimal Assistance - Patient > 75%     Toileting Toileting    Toileting assist Assist for toileting: Minimal Assistance - Patient > 75% Assistive Device Comment: urinal   Transfers Chair/bed transfer  Transfers assist     Chair/bed transfer assist level: Minimal Assistance - Patient > 75%     Locomotion Ambulation   Ambulation assist      Assist level: Minimal Assistance - Patient > 75% Assistive device: Walker-rolling  Max distance: 12'   Walk 10 feet activity   Assist           Walk 50 feet activity   Assist           Walk 150 feet activity   Assist           Walk 10 feet on uneven surface  activity   Assist           Wheelchair     Assist               Wheelchair 50 feet with 2 turns activity    Assist            Wheelchair 150 feet activity     Assist          Blood pressure 129/81, pulse  78, temperature 98.3 F (36.8 C), resp. rate 15, weight 92.9 kg, SpO2 95 %.  Medical Problem List and Plan: 1. Debility secondary to bacterial urosepsis,              -patient may shower             -ELOS/Goals: ELOS 7/16 2.  Antithrombotics: -DVT/anticoagulation:  Pharmaceutical: Heparin             -antiplatelet therapy: N/A 3. Pain Management:  Tramadol or Tylenol prn. Currently well controlled.  4. Mood:  LCSW to follow for evaluation and support.              -antipsychotic agents: N/A 5. Neuropsych: This patient appears to be capable of making decisions on his own behalf currently.  6. Skin/Wound Care: Routine pressure relief measures.  7. Fluids/Electrolytes/Nutrition: continue supplements--will increase prostat to tid.   8. Enterococcal bacteremia: Continue Zyvox for 2 weeks with with end date 7/11. Reports loose stools today--will d/c colace.  9. Obstructive lung disease/underlying scoliosis: Pulmonary hygiene and encourage OOB activity.  10. Hypercarbic respiratory failure/OSA:  Nees to use CPAP whenever napping and at nights.  11. Macrocytic anemia/Vitamin B 12 deficiency: On  Vitamin B 12 supplement. H/H in 7-8 range , improved today  12.  Acute renal failure: Due to infection has resolved.  13. Chronic heart failure with preserved EF: Add low salt restrictions. Will monitor for signs of overload with daily weights and strict I/O. Continue metoprolol.  19. H/o "Excessive Alcohol use" /Hepatic steatosis: Abnormal LFTs resolving. Recheck in am. ETOH neuropathy likely as well , improvement in ataxia in part related to abstinence 15. Hypokalemia: Resolved with supplementation. Recheck in am.  16. Hypomagnesemia: Chronic due to malnutrition? Has had IV supplementation but still 1.6. Continue oral supplement--recheck labs in am.   17. Hypophosphatemia: Recheck levels in am.  18. Right 5th MT fx: WBAT with carbon enforced shoes plates.  19. Essential tremor: On Xanax 0.5mg  daily prn.  Would avoid benzos, may consider SSRI 20. HTN: Continue amlodipine 5mg  daily.     LOS: 1 days A FACE TO [place] Albertina Leise 07/08/2019, 9:43 AM

## 2019-07-08 NOTE — Patient Care Conference (Signed)
Inpatient RehabilitationTeam Conference and Plan of Care Update Date: 07/08/2019   Time: 1:29 PM    Patient Name: Louis [name]      Medical Record Number: [id number]  Date of Birth: February 05, 1955 Sex: Male         Room/Bed: 1T14H/8O87N-79 Payor Info: Payor: [employer] / Plan: Electrical engineer / Product Type: *No Product type* /    Admit Date/Time:  07/07/2019  3:27 PM  Primary Diagnosis:  <principal problem not specified>  Hospital Problems: Active Problems:   Debility    Expected Discharge Date: Expected Discharge Date: 07/17/19  Team Members Present: Physician leading conference: Dr. Alysia Penna Care Coodinator Present: Erlene Quan, BSW;Osmond Steckman Hervey Ard, RN, BSN, Lily Nurse Present: Other (comment) Rufina Falco Mcglosky RN) PT Present: Barrie Folk, PT OT Present: Clyda Greener, OT SLP Present: Jettie Booze, CF-SLP PPS Coordinator present : Gunnar Fusi, SLP     Current Status/Progress Goal Weekly Team Focus  Bowel/Bladder   Continent [name]&[name]  Maintain Continence  Assess toileting needs PRN.   Swallow/Nutrition/ Hydration             ADL's   supervision for UB selfcare with min assist for LB selfcare and transfers.  Limited endurance with tremors noted  supervision to modified independent level  selfcare retraining, balance retraining, transfer training, DME education, pt education   Mobility   Eval Pending  Eval Pending.      Communication             Safety/Cognition/ Behavioral Observations            Pain   Denies pain  Remain pain free  Assess pain QShift and PRN.   Skin   Clean, Dry, Intact. Some ecchmoysis on abdomen, hip, knee.  Maintain good skin integrity.  Assess Qshift.     Team Discussion:  Discharge Planning/Teaching Needs:    TBD     Current Update:    Current Barriers to Discharge:  Tremors, peripheral neuropathy and foot fractures limits progress  Possible Resolutions to Barriers: MD initiate propanolol for tremors, Wife to assist don/doff  special shoes for foot fractures  Patient on target to meet rehab goals: yes  *See Care Plan and progress notes for long and short-term goals.   Revisions to Treatment Plan:  On target to meet MOD I transfer goals and supervision gait with walker    Medical Summary                 I attest that I was present, lead the team conference, and concur with the assessment and plan of the team.   Louis [name] 07/08/2019, 1:29 PM

## 2019-07-08 NOTE — Progress Notes (Signed)
Occupational Therapy Assessment and Plan  Patient Details  Name: Louis [name] MRN: [id number] Date of Birth: 10-07-1955  OT Diagnosis: abnormal posture and muscle weakness (generalized) Rehab Potential: Rehab Potential (ACUTE ONLY): Excellent ELOS: 6-8 days   Today's Date: 07/08/2019 OT Individual Time: 4097-3532 OT Individual Time Calculation (min): 72 min     Hospital Problem: Active Problems:   Debility   Past Medical History:  Past Medical History:  Diagnosis Date  . Anxiety    situational  . BPH (benign prostatic hyperplasia)    on flomax  . Bundle branch block    per prior PCP records  . CHF (congestive heart failure) ([place])   . COPD (chronic obstructive pulmonary disease) ([place])   . Diverticulosis    by colonoscopy  . Eczema    per prior PCP records  . Elevated PSA 2015   peaked 6s, s/p benign biopsy 2014, sees urology [name] ([place])  . Essential tremor   . GERD (gastroesophageal reflux disease)    per prior PCP records  . History of panic attacks    per prior PCP records  . HTN (hypertension)   . Mild intermittent asthma in adult without complication   . Obesity, Class I, BMI 30-34.9   . Osteoporosis    DEXA 06/2013 with osteopenia - h/o ?wrist/hip fracture from AVN from chronic steroid use (asthma), took reclast for 1 year  . Renal artery stenosis in 1 of 2 vessels ([place]) 2011   by [place]  . Seasonal allergies   . Sleep apnea   . Thoracic scoliosis childhood  . Transaminitis    per prior PCP records   Past Surgical History:  Past Surgical History:  Procedure Laterality Date  . APPLICATION OF WOUND VAC Left 05/09/2018   Procedure: Application Of Wound Vac;  Surgeon: Newt Minion, MD;  Location: [facility];  Service: Orthopedics;  Laterality: Left;  . BIOPSY  04/30/2019   Procedure: BIOPSY;  Surgeon: Juanita Craver, MD;  Location: [facility];  Service: Endoscopy;;  . COLONOSCOPY  12/2013   polyps, int hem, diverticulosis, rpt 5 yrs ([place])  . COLONOSCOPY WITH  PROPOFOL N/A 04/30/2019   Procedure: COLONOSCOPY WITH PROPOFOL;  Surgeon: Juanita Craver, MD;  Location: [facility];  Service: Endoscopy;  Laterality: N/A;  . ELBOW SURGERY Right 2008   golfer's elbow  . ESOPHAGOGASTRODUODENOSCOPY (EGD) WITH PROPOFOL N/A 04/30/2019   Procedure: ESOPHAGOGASTRODUODENOSCOPY (EGD) WITH PROPOFOL;  Surgeon: Juanita Craver, MD;  Location: [facility];  Service: Endoscopy;  Laterality: N/A;  . INGUINAL HERNIA REPAIR Bilateral childhood & ~1995  . LAPAROTOMY N/A 01/05/2019   Procedure: EXPLORATORY LAPAROTOMY graham patch repair;  Surgeon: Benjamine Sprague, DO;  Location: [facility];  Service: General;  Laterality: N/A;  . NASAL SEPTUM SURGERY  2013   deviated septum  . ORIF ANKLE FRACTURE Left 05/09/2018   Procedure: OPEN REDUCTION INTERNAL FIXATION LEFT LISFRANC FRACTURE/DISLOCATION;  Surgeon: Newt Minion, MD;  Location: [facility];  Service: Orthopedics;  Laterality: Left;  . POLYPECTOMY  04/30/2019   Procedure: POLYPECTOMY;  Surgeon: Juanita Craver, MD;  Location: [facility];  Service: Endoscopy;;  . TEE WITHOUT CARDIOVERSION N/A 06/29/2019   Procedure: TRANSESOPHAGEAL ECHOCARDIOGRAM (TEE);  Surgeon: Acie Fredrickson Wonda Cheng, MD;  Location: [facility];  Service: Cardiovascular;  Laterality: N/A;  . US ECHOCARDIOGRAPHY  02/2009   WNL, EF >55% [facility])  . US RENAL/AORTA Right 05/2009   1-59% diameter reduction renal artery, rec rpt 2 yrs    Assessment & Plan Clinical Impression: Patient is a  64 y.o. year old male with recent admission to the hospital on 06/25/19 with 2 week history of difficulty walking, confusion, SOB and multiple falls. He was found to have acute renal failure with hyperkalemia, hyponatremia and was febrile at admission.  Neurology and ID consulted for full work up which revealed sepsis due to enterococcus bacteremia.  Urine culture positive for enterococcus but patient denied symptoms.  CT head negative for acute changes. CTA chest negative for PE but showed severe hepatic  steatosis and  1cm left upper pole renal lesion with recommendations for nonemergent abdominal MRI for work up. TEE was negative for vegetation and showed normal LVF with no valvular abnormality. He was started on ampicillin but continued to have fevers therefore antibiotics changed to daptomycin on 06/27 by Dr. Baxter Flattery.  MRI abdomen done and was negative for abdominal abscess and 1 cm benign hemorrhagic cyst noted in upper pole of left upper kidney.     Patient transferred to CIR on 07/07/2019 .    Patient currently requires min with basic self-care skills secondary to muscle weakness, decreased cardiorespiratoy endurance and decreased standing balance and decreased balance strategies.  Prior to hospitalization, patient could complete ADLs with modified independent .  Patient will benefit from skilled intervention to decrease level of assist with basic self-care skills and increase independence with basic self-care skills prior to discharge home with care partner.  Anticipate patient will require intermittent supervision and follow up outpatient.  OT - End of Session Activity Tolerance: Decreased this session Endurance Deficit: Yes OT Assessment Rehab Potential (ACUTE ONLY): Excellent OT Patient demonstrates impairments in the following area(s): Balance;Endurance OT Basic ADL's Functional Problem(s): Grooming;Bathing;Dressing;Toileting OT Advanced ADL's Functional Problem(s): Simple Meal Preparation OT Transfers Functional Problem(s): Toilet;Tub/Shower OT Additional Impairment(s): None OT Plan OT Intensity: Minimum of 1-2 x/day, 45 to 90 minutes OT Frequency: 5 out of 7 days OT Duration/Estimated Length of Stay: 6-8 days OT Treatment/Interventions: Balance/vestibular training;Community reintegration;DME/adaptive equipment instruction;Discharge planning;Pain management;Patient/family education;Functional mobility training;Neuromuscular re-education;Psychosocial support;UE/LE Strength  taining/ROM;Therapeutic Exercise;Self Care/advanced ADL retraining;Therapeutic Activities;UE/LE Coordination activities OT Self Feeding Anticipated Outcome(s): independent OT Basic Self-Care Anticipated Outcome(s): supervision to modifed independent OT Toileting Anticipated Outcome(s): modified independent OT Bathroom Transfers Anticipated Outcome(s): supervision to modified independent OT Recommendation Patient destination: Home Follow Up Recommendations: Outpatient OT Equipment Recommended: To be determined   Skilled Therapeutic Intervention Pt completed supine to sit with supervision.  He was able to complete donning his socks and shoes with min assist to start in order to walk into the bathroom.  He was able to complete transfer with the RW with min assist and complete toileting at min assist.  He then transferred over to the tub seat with min guard as well.  Showering was completed at min assist sit to stand with use of the grab bar for support.  He was able to complete dressing sit to stand from the shower seat with increased time and rest breaks secondary to fatigue.  He completed toilet transfer again with min assist and then transferred out to the sink for washing his hands in standing.  Increased trunk flexion noted in standing with full body tremor secondary to increased fatigue.  He finished session sitting in the wheelchair with call button and phone inr reach and safety belt in place.  Pt is in agreement with supervision to modified independent goals,  He stated he will have 24 hr supervision from spouse at home.    OT Evaluation Precautions/Restrictions  Precautions Precautions: Fall Restrictions Weight Bearing  Restrictions: No Other Position/Activity Restrictions: WBAT in shoes with carbon plate inserts per wife  Pain Pain Assessment Pain Scale: Faces Pain Score: 0-No pain Home Living/Prior Functioning Home Living Family/patient expects to be discharged to:: Private  residence Living Arrangements: Spouse/significant other Available Help at Discharge: Family, Available 24 hours/day Type of Home: House Home Access: Stairs to enter [employer] of Steps: 4 Entrance Stairs-Rails: Right Home Layout: Able to live on main level with bedroom/bathroom Bathroom Shower/Tub: Multimedia programmer: Handicapped height Bathroom Accessibility: Yes  Lives With: Spouse IADL History Homemaking Responsibilities: Yes Meal Prep Responsibility: Secondary Current License: Yes Occupation: Full time employment Type of Occupation: Theme park manager Leisure and Hobbies: Likes to play golf Prior Function Level of Independence: Independent with basic ADLs, Independent with gait Driving: Yes Comments: original fx May 2020 (L foot); Recent fall June 13th ( R metatarsal fx) - per Sharol Given can wear steel insert shoes for mobility ADL ADL Eating: Independent Where Assessed-Eating: Wheelchair Grooming: Contact guard Where Assessed-Grooming: Standing at sink Upper Body Bathing: Setup Where Assessed-Upper Body Bathing: Shower Lower Body Bathing: Minimal assistance Where Assessed-Lower Body Bathing: Shower Upper Body Dressing: Setup Where Assessed-Upper Body Dressing: Chair Lower Body Dressing: Minimal assistance Where Assessed-Lower Body Dressing: Chair Toileting: Minimal assistance Where Assessed-Toileting: Glass blower/designer: Psychiatric nurse Method: Counselling psychologist: Grab bars, Raised toilet seat Social research officer, government: Minimal assistance Social research officer, government Method: Print production planner with back Vision Baseline Vision/History: Wears glasses Wears Glasses: Distance only Patient Visual Report: No change from baseline Vision Assessment?: No apparent visual deficits Perception  Perception: Within Functional Limits Praxis Praxis: Intact Cognition Overall Cognitive Status: Within  Functional Limits for tasks assessed Arousal/Alertness: Awake/alert Orientation Level: Person;Place;Situation Person: Oriented Place: Oriented Situation: Oriented Year: 2021 Month: July Day of Week: Correct Memory: Appears intact Immediate Memory Recall: Sock;Blue;Bed Memory Recall Sock: Without Cue Memory Recall Blue: Without Cue Memory Recall Bed: Without Cue Attention: Sustained;Selective Sustained Attention: Appears intact Selective Attention: Appears intact Awareness: Appears intact Problem Solving: Appears intact Safety/Judgment: Appears intact Sensation Sensation Light Touch: Appears Intact Hot/Cold: Appears Intact Proprioception: Appears Intact Stereognosis: Appears Intact Additional Comments: sensation intact in BUEs Coordination Gross Motor Movements are Fluid and Coordinated: No Fine Motor Movements are Fluid and Coordinated: No Coordination and Movement Description: Pt with bilateral tremors present in both UEs, left UE slightly worse than right Motor  Motor Motor: Within Functional Limits Motor - Skilled Clinical Observations: generalized weakness, body tremors in standing Mobility  Bed Mobility Bed Mobility: Supine to Sit Supine to Sit: Supervision/Verbal cueing Transfers Sit to Stand: Minimal Assistance - Patient > 75% Stand to Sit: Minimal Assistance - Patient > 75%  Trunk/Postural Assessment  Cervical Assessment Cervical Assessment: Exceptions to [facility] (forward cervical flexion) Thoracic Assessment Thoracic Assessment: Exceptions to [facility] (right trunk elongation with thoracic flexion from scoliosis) Lumbar Assessment Lumbar Assessment: Exceptions to [facility] (lumbar flexion in standing)  Balance Balance Balance Assessed: Yes Static Sitting Balance Static Sitting - Balance Support: Feet supported Static Sitting - Level of Assistance: 6: Modified independent (Device/Increase time) Dynamic Sitting Balance Dynamic Sitting - Balance Support: Feet  unsupported Dynamic Sitting - Level of Assistance: 5: Stand by assistance Static Standing Balance Static Standing - Balance Support: Bilateral upper extremity supported Static Standing - Level of Assistance: 4: Min assist Dynamic Standing Balance Dynamic Standing - Balance Support: During functional activity Dynamic Standing - Level of Assistance: 4: Min assist Extremity/Trunk Assessment RUE Assessment RUE Assessment: Exceptions to [facility] Passive  Range of Motion (PROM) Comments: PROM WFLS for all joints Active Range of Motion (AROM) Comments: AROM WFLS for all joints General Strength Comments: strength overall 4/5 throughout with noted tremors proximal and distal that were premorbid LUE Assessment LUE Assessment: Exceptions to [facility] Active Range of Motion (AROM) Comments: WFLs for all joints General Strength Comments: strength overall 4+/5 throughout with noted tremors proximal and distal that were premorbid     Refer to Care Plan for Long Term Goals  Recommendations for other services: None    Discharge Criteria: Patient will be discharged from OT if patient refuses treatment 3 consecutive times without medical reason, if treatment goals not met, if there is a change in medical status, if patient makes no progress towards goals or if patient is discharged from hospital.  The above assessment, treatment plan, treatment alternatives and goals were discussed and mutually agreed upon: by patient  Emil Weigold OTR/L 07/08/2019, 10:29 AM

## 2019-07-08 NOTE — Progress Notes (Signed)
Inpatient Rehabilitation Care Coordinator Assessment and Plan  Patient Details  Name: Louis [name] MRN: [id number] Date of Birth: [birth year]  Today's Date: 07/08/2019  Problem List:  Patient Active Problem List   Diagnosis Date Noted  . Debility 07/07/2019  . Acute respiratory failure with hypoxia ([place])   . Atelectasis   . Enterococcal bacteremia 06/26/2019  . AKI (acute kidney injury) ([place]) 06/25/2019  . Perforated viscus 01/06/2019  . Severe sepsis with septic shock ([place]) 01/06/2019  . Acute renal failure ([place]) 01/06/2019  . Hyperkalemia 01/06/2019  . Hyponatremia 01/06/2019  . Lactic acidosis 01/06/2019  . Onychomycosis 05/19/2018  . Foot fracture, left, closed, initial encounter 05/09/2018  . Lisfranc dislocation, left, initial encounter   . Acute bronchitis 02/25/2018  . Primary pulmonary hypertension ([place]) 07/09/2017  . Diastolic dysfunction 27/74/1287  . OSA (obstructive sleep apnea) 07/08/2017  . SOB (shortness of breath) 07/02/2017  . Chronic respiratory failure with hypoxia and hypercapnia ([place]) 07/02/2017  . Cardiomegaly 07/02/2017  . Exposure keratitis 02/17/2017  . Pedal edema 12/18/2016  . Transaminitis 12/18/2016  . Bilateral pes planus 12/18/2016  . Psoriasis-eczema overlap condition 07/30/2016  . High serum high density lipoprotein (HDL) 09/26/2015  . Epistaxis 09/26/2015  . Renal artery stenosis in 1 of 2 vessels ([place])   . Essential tremor   . Elevated PSA   . GERD (gastroesophageal reflux disease)   . Health maintenance examination 10/08/2014  . Anxiety state 10/08/2014  . Obesity, Class I, BMI 30-34.9   . Mild intermittent asthma in adult without complication   . Seasonal allergies   . HTN (hypertension)   . Scoliosis   . Osteoporosis    Past Medical History:  Past Medical History:  Diagnosis Date  . Anxiety    situational  . BPH (benign prostatic hyperplasia)    on flomax  . Bundle branch block    per prior PCP records  . CHF (congestive  heart failure) ([place])   . COPD (chronic obstructive pulmonary disease) ([place])   . Diverticulosis    by colonoscopy  . Eczema    per prior PCP records  . Elevated PSA 2015   peaked 6s, s/p benign biopsy 2014, sees urology [name] ([place])  . Essential tremor   . GERD (gastroesophageal reflux disease)    per prior PCP records  . History of panic attacks    per prior PCP records  . HTN (hypertension)   . Mild intermittent asthma in adult without complication   . Obesity, Class I, BMI 30-34.9   . Osteoporosis    DEXA 06/2013 with osteopenia - h/o ?wrist/hip fracture from AVN from chronic steroid use (asthma), took reclast for 1 year  . Renal artery stenosis in 1 of 2 vessels ([place]) 2011   by [place]  . Seasonal allergies   . Sleep apnea   . Thoracic scoliosis childhood  . Transaminitis    per prior PCP records   Past Surgical History:  Past Surgical History:  Procedure Laterality Date  . APPLICATION OF WOUND VAC Left 05/09/2018   Procedure: Application Of Wound Vac;  Surgeon: Newt Minion, MD;  Location: [place];  Service: Orthopedics;  Laterality: Left;  . BIOPSY  04/30/2019   Procedure: BIOPSY;  Surgeon: Juanita Craver, MD;  [facility];  Service: Endoscopy;;  . COLONOSCOPY  12/2013   polyps, int hem, diverticulosis, rpt 5 yrs ([place])  . COLONOSCOPY WITH PROPOFOL N/A 04/30/2019   Procedure: COLONOSCOPY WITH PROPOFOL;  Surgeon: Juanita Craver, MD;  Location:  [facility];  Service: Endoscopy;  Laterality: N/A;  . ELBOW SURGERY Right 2008   golfer's elbow  . ESOPHAGOGASTRODUODENOSCOPY (EGD) WITH PROPOFOL N/A 04/30/2019   Procedure: ESOPHAGOGASTRODUODENOSCOPY (EGD) WITH PROPOFOL;  Surgeon: Juanita Craver, MD;  [facility];  Service: Endoscopy;  Laterality: N/A;  . INGUINAL HERNIA REPAIR Bilateral childhood & ~1995  . LAPAROTOMY N/A 01/05/2019   Procedure: EXPLORATORY LAPAROTOMY graham patch repair;  Surgeon: Benjamine Sprague, DO;  Location: [facility];  Service: General;   Laterality: N/A;  . NASAL SEPTUM SURGERY  2013   deviated septum  . ORIF ANKLE FRACTURE Left 05/09/2018   Procedure: OPEN REDUCTION INTERNAL FIXATION LEFT LISFRANC FRACTURE/DISLOCATION;  Surgeon: Newt Minion, MD;  Location: [place];  Service: Orthopedics;  Laterality: Left;  . POLYPECTOMY  04/30/2019   Procedure: POLYPECTOMY;  Surgeon: Juanita Craver, MD;  [facility];  Service: Endoscopy;;  . TEE WITHOUT CARDIOVERSION N/A 06/29/2019   Procedure: TRANSESOPHAGEAL ECHOCARDIOGRAM (TEE);  Surgeon: Acie Fredrickson Wonda Cheng, MD;  [facility]: [facility]COPY;  Service: Cardiovascular;  Laterality: N/A;  . US ECHOCARDIOGRAPHY  02/2009   WNL, EF >55% Claiborne [place])  . US RENAL/AORTA Right 05/2009   1-59% diameter reduction renal artery, rec rpt 2 yrs   Social History:  reports that he has never smoked. He has never used smokeless tobacco. He reports current alcohol use of about 14.0 standard drinks of alcohol per week. He reports that he does not use drugs.  Family / Support Systems Patient Roles: Spouse Spouse/Significant Other: [name] Children: 2 adult children Anticipated Caregiver: [name] (spouse) Ability/Limitations of Caregiver: Works schedule varies Caregiver Availability: Intermittent  Social History Preferred language: English Religion: Methodist Cultural Background: Theme park manager Read: Yes Write: Yes Employment Status: Employed   Abuse/Neglect Abuse/Neglect Assessment Can Be Completed: Yes Physical Abuse: Denies Verbal Abuse: Denies Sexual Abuse: Denies Exploitation of patient/patient's resources: Denies Self-Neglect: Denies  Emotional Status Pt's affect, behavior and adjustment status: no Recent Psychosocial Issues: no Psychiatric History: no Substance Abuse History: no  Patient / Family Perceptions, Expectations & Goals Pt/Family understanding of illness & functional limitations: yes 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 1 step front door, then 3 with railings. Back door 4 steps with railings) Insurance Resources: Multimedia programmer (specify) Psychologist, counselling) Does the patient have any problems obtaining your medications?: No Care Coordinator Anticipated Follow Up Needs: HH/OP Expected length of stay: 7-10 Days  Clinical Impression SW introduced self explained role and addressed questions and concerns. Sw will continue to follow up.  [name] 07/08/2019, 9:28 AM

## 2019-07-08 NOTE — Progress Notes (Signed)
Pt refused [employer] standard drink due to the amount of calories and little protein. Pt prefers to have his own personal boost supplement.

## 2019-07-08 NOTE — Evaluation (Signed)
Physical Therapy Assessment and Plan  Patient Details  Name: Louis [name] MRN: [id number] Date of Birth: April 22, 1955  PT Diagnosis: Abnormal posture, Abnormality of gait, Difficulty walking and Muscle weakness Rehab Potential: Good ELOS: 6-8 days   Today's Date: 07/08/2019 PT Individual Time: 1025-8527 PT Individual Time Calculation (min): 71 min    Hospital Problem: Active Problems:   Debility   Past Medical History:  Past Medical History:  Diagnosis Date  . Anxiety    situational  . BPH (benign prostatic hyperplasia)    on flomax  . Bundle branch block    per prior PCP records  . CHF (congestive heart failure) ([place])   . COPD (chronic obstructive pulmonary disease) ([place])   . Diverticulosis    by colonoscopy  . Eczema    per prior PCP records  . Elevated PSA 2015   peaked 6s, s/p benign biopsy 2014, sees urology Louis [name] ([place])  . Essential tremor   . GERD (gastroesophageal reflux disease)    per prior PCP records  . History of panic attacks    per prior PCP records  . HTN (hypertension)   . Mild intermittent asthma in adult without complication   . Obesity, Class I, BMI 30-34.9   . Osteoporosis    DEXA 06/2013 with osteopenia - h/o ?wrist/hip fracture from AVN from chronic steroid use (asthma), took reclast for 1 year  . Renal artery stenosis in 1 of 2 vessels ([place]) 2011   by [place]  . Seasonal allergies   . Sleep apnea   . Thoracic scoliosis childhood  . Transaminitis    per prior PCP records   Past Surgical History:  Past Surgical History:  Procedure Laterality Date  . APPLICATION OF WOUND VAC Left 05/09/2018   Procedure: Application Of Wound Vac;  Surgeon: Louis Minion, MD;  Location: [place];  Service: Orthopedics;  Laterality: Left;  . BIOPSY  04/30/2019   Procedure: BIOPSY;  Surgeon: Louis Craver, MD;  [facility];  Service: Endoscopy;;  . COLONOSCOPY  12/2013   polyps, int hem, diverticulosis, rpt 5 yrs (Louis [name])  . COLONOSCOPY WITH PROPOFOL N/A  04/30/2019   Procedure: COLONOSCOPY WITH PROPOFOL;  Surgeon: Louis Craver, MD;  [facility];  Service: Endoscopy;  Laterality: N/A;  . ELBOW SURGERY Right 2008   golfer's elbow  . ESOPHAGOGASTRODUODENOSCOPY (EGD) WITH PROPOFOL N/A 04/30/2019   Procedure: ESOPHAGOGASTRODUODENOSCOPY (EGD) WITH PROPOFOL;  Surgeon: Louis Craver, MD;  [facility];  Service: Endoscopy;  Laterality: N/A;  . INGUINAL HERNIA REPAIR Bilateral childhood & ~1995  . LAPAROTOMY N/A 01/05/2019   Procedure: EXPLORATORY LAPAROTOMY graham patch repair;  Surgeon: Louis Sprague, DO;  Location: [facility];  Service: General;  Laterality: N/A;  . NASAL SEPTUM SURGERY  2013   deviated septum  . ORIF ANKLE FRACTURE Left 05/09/2018   Procedure: OPEN REDUCTION INTERNAL FIXATION LEFT LISFRANC FRACTURE/DISLOCATION;  Surgeon: Louis Minion, MD;  Location: [place];  Service: Orthopedics;  Laterality: Left;  . POLYPECTOMY  04/30/2019   Procedure: POLYPECTOMY;  Surgeon: Louis Craver, MD;  [facility];  Service: Endoscopy;;  . TEE WITHOUT CARDIOVERSION N/A 06/29/2019   Procedure: TRANSESOPHAGEAL ECHOCARDIOGRAM (TEE);  Surgeon: Louis Fredrickson Wonda Cheng, MD;  Location: [facility] ENDOSCOPY;  Service: Cardiovascular;  Laterality: N/A;  . US ECHOCARDIOGRAPHY  02/2009   WNL, EF >55% Louis [name])  . US RENAL/AORTA Right 05/2009   1-59% diameter reduction renal artery, rec rpt 2 yrs    Assessment & Plan Clinical Impression: Louis [name] is  a 64 year old male with history of familial tremors, restrictive lung disease with scoliosis, perforated duodenal ulcer 01/2019, hepatic steatosis, moderate to excessive alcohol use, left foot lisfranc fracture with dislocation s/p ORIF 05/2018 with onset of neuropathy, B12 deficiency, gait disorder with history of falls who was admitted to [facility] on 06[facility]19 with 2 week history of difficulty walking, confusion, SOB and multiple falls. He was found to have acute renal failure with hyperkalemia, hyponatremia and was  febrile at admission.  Neurology and ID consulted for full work up which revealed sepsis due to enterococcus bacteremia.  Urine culture positive for enterococcus but patient denied symptoms.  CT head negative for acute changes. CTA chest negative for PE but showed severe hepatic steatosis and  1cm left upper pole renal lesion with recommendations for nonemergent abdominal MRI for work up. TEE was negative for vegetation and showed normal LVF with no valvular abnormality. He was started on ampicillin but continued to have fevers therefore antibiotics changed to daptomycin on 06/27 by Louis [name].  MRI abdomen done and was negative for abdominal abscess and 1 cm benign hemorrhagic cyst noted in upper pole of left upper kidney.    He has had issues with delirium and waxing and waning of mental status with plans for CIR initially on 06/30 but developed acute hypoxemic hypercarbic respiratory failure with respiratory arrest due to acute pulmonary edema and required intubation. He was treated with IV diuresis and bronchoscopy done revealing severe bronchomalacia right lung particularly RLL with severe narrowing of RLL and RML orifices and mild LLL bronchomalacia. BAL showed inflammatory infiltrate with neutrophil predominance and antibiotic coverage broadened to include HCAP.  . Due to concerns for daptomycin toxicity,  Louis [name] recommended change to antibiotics to Linzeloid  X 14 day with 06/28 as day #1.  BAL.  He tolerated extubation by 07/01 and has continued to have bouts of confusion and mild productive cough. BAL showed normal flora and Cefepime d/c on 07/03.  He has had issues with electrolyte abnormalities that are being supplemented. Macrocytic anemia of being monitored with H/H in 7.7- 8.7 range. He did have follow up X rays of right foot done 07/05 and c/w 06/15 reveal increased lucency of fracture without definite callus and no change in alinement.  Therapy ongoing with CAM boot on left foot and CIR  recommended due to debility.    Patient currently requires min with mobility secondary to muscle weakness and decreased cardiorespiratoy endurance.  Prior to hospitalization, patient was modified independent  with mobility and lived with Spouse in a House home.  Home access is 4Stairs to enter.  Patient will benefit from skilled PT intervention to maximize safe functional mobility, minimize fall risk and decrease caregiver burden for planned discharge home with 24 hour supervision.  Anticipate patient will benefit from follow up OP at discharge.  PT - End of Session Activity Tolerance: Tolerates 30+ min activity with multiple rests Endurance Deficit: Yes PT Assessment Rehab Potential (ACUTE/IP ONLY): Good PT Barriers to Discharge: Inaccessible home environment;Decreased caregiver support PT Patient demonstrates impairments in the following area(s): Balance;Safety;Endurance;Motor PT Transfers Functional Problem(s): Bed Mobility;Bed to Chair;Car;Furniture PT Locomotion Functional Problem(s): Ambulation;Stairs PT Plan PT Intensity: Minimum of 1-2 x/day ,45 to 90 minutes PT Frequency: 5 out of 7 days PT Duration Estimated Length of Stay: 6-8 days PT Treatment/Interventions: Ambulation/gait training;Community reintegration;DME/adaptive equipment instruction;Neuromuscular re-education;Stair training;UE/LE Strength taining/ROM;Balance/vestibular training;Discharge planning;Therapeutic Activities;UE/LE Coordination activities;Functional mobility training;Patient/family education;Therapeutic Exercise PT Transfers Anticipated Outcome(s): Mod I for transfers PT Locomotion  Anticipated Outcome(s): Mod I to supervision w/ LRAD. PT Recommendation Follow Up Recommendations: Outpatient PT Patient destination: Home Equipment Recommended: To be determined Equipment Details: has RW, rollator, 3 in 1, and qc, able to borrow any equipment from church.  Skilled Therapeutic Intervention Evaluation completed  (see details above and below) with education on PT POC and goals and individual treatment initiated with focus on endurance, gait and balance.  Pt presents sitting in recliner w/ spouse present.  Pt agreeable to treatment.  Pt performs multiple sit to stand transfers w/ min A but verbal cues for improved initiation improves independence.  Pt transfers from arm chair, recliner, bed, low couch and car.  Pt performs bed mobility w/ supervision from flat bed.  Pt performs stairs w/ bilateral rails and min to CGA, step-to gait pattern on 4 6" stairs.  Pt performs 1 curb height step w/ min A and Right rail as has at home.  Discussed use of B hands on rail if needed.  Pt transfers to SUV height car w/ CGA to min A and verbal cues for safety w/ approach.  Pt amb multiple trials w/ RW and CGA, but increased verbal cues for posture and walker management as fatigues.  Pt amb up to 150' w/o LOB.  Pt amb into BR and transfers to commode w/ CGA.  Pt stands at sink to wash hands although leans forward onto forearms.  Pt returned to recliner and alarm on w/ all needs in reach.  PT Evaluation Precautions/Restrictions Precautions Precautions: Fall Restrictions Weight Bearing Restrictions: No Other Position/Activity Restrictions: WBAT in shoes with carbon plate inserts per wife General Chart Reviewed: Yes Family/Caregiver Present: Yes Vital Signs  Pain Pain Assessment Pain Scale: 0-10 Pain Score: 0-No pain Home Living/Prior Functioning Home Living Available Help at Discharge: Family;Available 24 hours/day Type of Home: House Home Access: Stairs to enter [employer] of Steps: 4 Entrance Stairs-Rails: Can reach both (from back, from front has 1 step w/ right rail and then 3 steps.) Home Layout: Able to live on main level with bedroom/bathroom Bathroom Shower/Tub: Multimedia programmer: Handicapped height Bathroom Accessibility: Yes  Lives With: Spouse Prior Function Level of  Independence: Independent with basic ADLs;Independent with transfers;Independent with gait  Able to Take Stairs?: Yes Driving: Yes Comments: original fx May 2020 (L foot); Recent fall June 13th ( R metatarsal fx) - per Sharol Given can wear steel insert shoes for mobility Vision/Perception     Cognition Overall Cognitive Status: Within Functional Limits for tasks assessed Arousal/Alertness: Awake/alert Attention: Sustained;Selective Sustained Attention: Appears intact Selective Attention: Appears intact Memory: Appears intact Sensation Sensation Light Touch: Appears Intact Additional Comments: appears intact in LEs to light touch, although does state has neuropathy. Coordination Gross Motor Movements are Fluid and Coordinated: No Fine Motor Movements are Fluid and Coordinated: No Motor  Motor Motor: Within Functional Limits Motor - Skilled Clinical Observations: generalized weakness, body tremors in standing  Mobility Bed Mobility Bed Mobility: Supine to Sit;Sit to Supine Supine to Sit: Supervision/Verbal cueing Sit to Supine: Supervision/Verbal cueing Transfers Transfers: Sit to Stand;Stand to Sit Sit to Stand: Minimal Assistance - Patient > 75% Stand to Sit: Minimal Assistance - Patient > 75% Transfer (Assistive device): Rolling walker Locomotion  Gait Ambulation: Yes Gait Assistance: Contact Guard/Touching assist Gait Distance (Feet): 150 Feet Assistive device: Rolling walker Gait Gait velocity: decreased Stairs / Additional Locomotion Stairs: Yes Stairs Assistance: Minimal Assistance - Patient > 75% Stair Management Technique: Step to pattern;Two rails Number of Stairs: 4 Height  of Stairs: 6 Curb: Minimal Assistance - Patient >75% (w/ right rail.) Wheelchair Mobility Wheelchair Mobility: Yes Wheelchair Assistance: Development worker, international aid: Both upper extremities;Both lower extermities (alternating LEs and UEs.) Wheelchair Parts Management:  Needs assistance Distance: 100  Trunk/Postural Assessment  Thoracic Assessment Thoracic Assessment: Exceptions to [facility] (scoliosis.) Lumbar Assessment Lumbar Assessment: Exceptions to [facility] (flexion w/ standing posture.)  Balance Static Sitting Balance Static Sitting - Balance Support: Feet supported Static Sitting - Level of Assistance: 6: Modified independent (Device/Increase time) Static Standing Balance Static Standing - Balance Support: Bilateral upper extremity supported Static Standing - Level of Assistance: 4: Min assist Dynamic Standing Balance Dynamic Standing - Balance Support: During functional activity Dynamic Standing - Level of Assistance: 4: Min assist Extremity Assessment      RLE Assessment RLE Assessment: Within Functional Limits General Strength Comments: grossly 4/5 LLE Assessment LLE Assessment: Within Functional Limits General Strength Comments: grossly 4/5    Refer to Care Plan for Long Term Goals  Recommendations for other services: None   Discharge Criteria: Patient will be discharged from PT if patient refuses treatment 3 consecutive times without medical reason, if treatment goals not met, if there is a change in medical status, if patient makes no progress towards goals or if patient is discharged from hospital.  The above assessment, treatment plan, treatment alternatives and goals were discussed and mutually agreed upon: by patient and by family  Ladoris Gene 07/08/2019, 3:26 PM

## 2019-07-08 NOTE — Progress Notes (Signed)
Occupational Therapy Session Note  Patient Details  Name: Louis [name] MRN: [id number] Date of Birth: 1955/06/23  Today's Date: 07/08/2019 OT Individual Time: 9798-9211 OT Individual Time Calculation (min): 60 min    Short Term Goals: Week 1:   See OT POC  Skilled Therapeutic Interventions/Progress Updates:    Pt received in w/c, pleasant and ready for session, no c/ o pain. Pt completed sit > stand with CGA. Pt used RW to complete functional mobility into the bathroom, CGA overall. Pt able to complete clothing management and all toileting tasks with CGA. Pt returned to w/c and completed oral care at the sink with set up assist. Pt was taken to the therapy gym via w/c for time management. Pt completed functional alternating stepping activity with 4 in step with min A required for dynamic standing balance 3x 10 repetitions. Pt then completed sit > stand without UE support holding 1 kg medicine ball. Once standing pt completed functional reaching with medicine ball overhead and forward 3x 10 repetitions. Pt requiring seated rest break between each set. Pt returned to his room via w/c. He was left sitting up in the recliner with all needs met, chair alarm set.   Therapy Documentation Precautions:  Restrictions Weight Bearing Restrictions: No   Therapy/Group: Individual Therapy  Curtis Sites 07/08/2019, 7:26 AM

## 2019-07-08 NOTE — Progress Notes (Signed)
[name], male   DOB: 08-10-1955, 64 y.o.   MRN: [id number] Team Conference Report to Patient/Family  Team Conference discussion was reviewed with the patient and caregiver, including goals, any changes in plan of care and target discharge date.  Patient and caregiver express understanding and are in agreement.  The patient has a target discharge date of 07/17/19.  [name] 07/08/2019, 2:00 PM

## 2019-07-08 NOTE — Progress Notes (Signed)
Inpatient Rehabilitation  Patient information reviewed and entered into eRehab system by Lelani Garnett M. Kiani Wurtzel, M.A., CCC/SLP, PPS Coordinator.  Information including medical coding, functional ability and quality indicators will be reviewed and updated through discharge.    

## 2019-07-08 NOTE — Plan of Care (Signed)
  Problem: RH SAFETY Goal: RH STG ADHERE TO SAFETY PRECAUTIONS W/ASSISTANCE/DEVICE Description: STG Adhere to Safety Precautions With cues and reminders Outcome: Progressing

## 2019-07-09 ENCOUNTER — Inpatient Hospital Stay (HOSPITAL_COMMUNITY): Payer: 59 | Admitting: Occupational Therapy

## 2019-07-09 ENCOUNTER — Inpatient Hospital Stay (HOSPITAL_COMMUNITY): Payer: 59 | Admitting: Physical Therapy

## 2019-07-09 LAB — METHYLMALONIC ACID, SERUM: Methylmalonic Acid, Quantitative: 505 nmol/L — ABNORMAL HIGH (ref 0–378)

## 2019-07-09 NOTE — Progress Notes (Signed)
Physical Therapy Session Note  Patient Details  Name: Louis [name] MRN: [id number] Date of Birth: 1955/11/08  Today's Date: 07/09/2019 PT Individual Time: [phone] and 1425-1535 PT Individual Time Calculation (min): 44 min and 70 min  Short Term Goals: Week 1:  PT Short Term Goal 1 (Week 1): STG = LTGs due to LOS  Skilled Therapeutic Interventions/Progress Updates:    Session 1: Pt received supine in bed and agreeable to therapy session. Supine>sitting R EOB, HOB partially elevated not using bedrails, with supervision. Sitting EOB performed UB dressing with set-up assist, LB dressing with set-up assist except shoes requiring max assist - requires increased time to complete dressing task. Sit<>stands EOB<>RW during dressing with CGA/min assist. Gait ~19ft x2 to/from bathroom using RW with CGA for steadying. Sit<>stand toilet<>RW and grab bar with pt using grab bar to pull up into standing and CGA for safety. Continent of bladder. Ambulated to sink requiring mod cuing for safe AD management at sink - hand hygiene with CGA for steadying.  Transported to/from gym in w/c for time management and energy conservation. Pt verbalizes his goal is to get back to being independent with ambulation without AD. Gait training 227ft using RW with CGA for steadying - demonstrates no significant LE gait mechanic impairments - slight increased anterior trunk lean. Gait training ~85ft, no AD, with CGA/min assist for steadying/balance - slower gait speed with more guarded trunk control and wider BOS. Lateral side stepping down/back in // bars without UE support 2x with CGA/min assist for steadying/balance. Transported back to room in w/c. Gait ~56ft w/c>recliner, no AD, with CGA for steadying. Pt left sitting in recliner with needs in reach and seat belt alarm on.  Session 2: Pt received sitting in recliner and agreeable to therapy session. Sit<>stands, no AD, with CGA for steadying during session. Gait ~18ft into bathooom,  no AD, with CGA/min assist for steadying - demonstrates wider BOS during gait. Sit<>stand to/from toilet using grab bar as needed with CGA for steadying. Continent of bladder. Standing hand hygiene at sink with CGA for steadying.  Transported to/from gym in w/c for time management and energy conservation. Stand pivot w/c<>Nustep, no AD, CGA for steadying using B UE support on armrests as needed. B UE and B LE reciprocal movements on Nustep targeting cardiovascular endurance transitioned to just using B LEs targeting strengthening against level 6 resistance for 6 minutes total. Stand pivot w/c<>EOM, no AD, CGA for steadying. Repeated sit<>stands to/from elevated EOM, no UE support, 2sets of 6 reps - cuing for anterior trunk lean/weight shift and increased hip/knee extension to achieve full stance. Sit<>supine<>rolling supervision.  Performed the following B LE exercises: - bridging with level 3 theraband for hip abduction activation then added 10lb weight across hips for 2 sets of 20 reps  - sidelying clamshells against level 3 theraband resistance 2x20 reps with pulse on final rep  - ankle DF/PF against level 3 theraband resistance x20reps each  Transported back to room. Gait ~49ft x2 in/out bathroom, no AD, with CGA for steadying. Continent of bladder in standing with CGA for steadying. Standing hand hygiene with CGA for steadying. Pt left seated in recliner with needs in reach and seat belt alarm on.  Therapy Documentation Precautions:  Precautions Precautions: Fall Restrictions Weight Bearing Restrictions: No Other Position/Activity Restrictions: WBAT in shoes with carbon plate inserts per wife Pain: Session 1: No reports of pain throughout session.  Session 2: No reports of pain throughout session.   Therapy/Group: Individual Therapy  Cherlyn Syring  Francis Dowse, PT, DPT, CSRS  07/09/2019, 7:46 AM

## 2019-07-09 NOTE — IPOC Note (Addendum)
Overall Plan of Care [facility]) Patient Details Name: PATTON RABINOVICH MRN: [id number] DOB: [birth year]  Admitting Diagnosis: Debility  Hospital Problems: Principal Problem:   Debility     Functional Problem List: Nursing Pain, Safety, Endurance, Motor, Nutrition  PT Balance, Safety, Endurance, Motor  OT Balance, Endurance  SLP    TR         Basic ADL's: OT Grooming, Bathing, Dressing, Toileting     Advanced  ADL's: OT Simple Meal Preparation     Transfers: PT Bed Mobility, Bed to Chair, Car, Manufacturing systems engineer, Metallurgist: PT Ambulation, Stairs     Additional Impairments: OT None  SLP        TR      Anticipated Outcomes Item Anticipated Outcome  Self Feeding independent  Swallowing      Basic self-care  supervision to modifed independent  Toileting  modified independent   Bathroom Transfers supervision to modified independent  Bowel/Bladder  remain continent of bowel and bladder with mod I assist  Transfers  Mod I for transfers  Locomotion  Mod I to supervision w/ LRAD.  Communication     Cognition     Pain  pain level less than 4 on scale of 0-10  Safety/Judgment  Remain free of injury, prevent falls with cues and reminders.   Therapy Plan: PT Intensity: Minimum of 1-2 x/day ,45 to 90 minutes PT Frequency: 5 out of 7 days PT Duration Estimated Length of Stay: 6-8 days OT Intensity: Minimum of 1-2 x/day, 45 to 90 minutes OT Frequency: 5 out of 7 days OT Duration/Estimated Length of Stay: 6-8 days     Due to the current state of emergency, patients may not be receiving their 3-hours of Medicare-mandated therapy.   Team Interventions: Nursing Interventions Patient/Family Education, Disease Management/Prevention, Discharge Planning, Psychosocial Support, Pain Management, Medication Management  PT interventions Ambulation/gait training, Community reintegration, DME/adaptive equipment instruction, Neuromuscular re-education, Stair  training, UE/LE Strength taining/ROM, Training and development officer, Discharge planning, Therapeutic Activities, UE/LE Coordination activities, Functional mobility training, Patient/family education, Therapeutic Exercise  OT Interventions Training and development officer, Academic librarian, Engineer, drilling, Discharge planning, Pain management, Patient/family education, Functional mobility training, Neuromuscular re-education, Psychosocial support, UE/LE Strength taining/ROM, Therapeutic Exercise, Self Care/advanced ADL retraining, Therapeutic Activities, UE/LE Coordination activities  SLP Interventions    TR Interventions    SW/CM Interventions Discharge Planning, Psychosocial Support, Patient/Family Education   Barriers to Discharge MD  Medical stability, Home enviroment access/loayout and Weight  Nursing      PT Inaccessible home environment, Decreased caregiver support    OT      SLP      SW       Team Discharge Planning: Destination: PT-Home ,OT- Home , SLP-  Projected Follow-up: PT-Outpatient PT, OT-  Outpatient OT, SLP-  Projected Equipment Needs: PT-To be determined, OT- To be determined, SLP-  Equipment Details: PT-has RW, rollator, 3 in 1, and qc, able to borrow any equipment from church., OT-  Patient/family involved in discharge planning: PT- Patient, Family member/caregiver,  OT-Patient, SLP-   MD ELOS: 7-10d Medical Rehab Prognosis:  Good Assessment:  64 year old male with history of familial tremors, restrictive lung disease with scoliosis, perforated duodenal ulcer 01/2019, hepatic steatosis, moderate to excessive alcohol use, left foot lisfranc fracture with dislocation s/p ORIF 05/2018 with onset of neuropathy, B12 deficiency, gait disorder with history of falls who was admitted to [facility] on 06/25/19 with 2 week history of difficulty walking, confusion, SOB and  multiple falls. He was found to have acute renal failure with hyperkalemia, hyponatremia and was  febrile at admission.  Neurology and ID consulted for full work up which revealed sepsis due to enterococcus bacteremia.  Urine culture positive for enterococcus but patient denied symptoms.  CT head negative for acute changes. CTA chest negative for PE but showed severe hepatic steatosis and  1cm left upper pole renal lesion with recommendations for nonemergent abdominal MRI for work up. TEE was negative for vegetation and showed normal LVF with no valvular abnormality. He was started on ampicillin but continued to have fevers therefore antibiotics changed to daptomycin on 06/27 by Dr. Baxter Flattery.  MRI abdomen done and was negative for abdominal abscess and 1 cm benign hemorrhagic cyst noted in upper pole of left upper kidney.     Now requiring 24/7 Rehab RN,MD, as well as CIR level PT, OT and SLP.  Treatment team will focus on ADLs and mobility with goals set at Sup/Mod I See Team Conference Notes for weekly updates to the plan of care

## 2019-07-09 NOTE — Progress Notes (Signed)
[place] PHYSICAL MEDICINE & REHABILITATION PROGRESS NOTE   Subjective/Complaints: No issues overnite , discussed potential etiology of neuropathy, discussed both B12 and ETOH as causes of neuropathy  ROS neg CP, SOB, N/V/D  Objective:   No results found. Recent Labs    07/07/19 0444 07/08/19 0538  WBC 5.8 5.9  HGB 8.7* 9.5*  HCT 26.6* 29.3*  PLT 385 438*   Recent Labs    07/07/19 0444 07/08/19 0538  NA 134* 135  K 3.6 3.9  CL 100 99  CO2 26 27  GLUCOSE 101* 99  BUN 14 9  CREATININE 0.80 0.80  CALCIUM 8.8* 9.4    Intake/Output Summary (Last 24 hours) at 07/09/2019 0705 Last data filed at 07/09/2019 0657 Gross per 24 hour  Intake 780 ml  Output 1925 ml  Net -1145 ml     Physical Exam: Vital Signs Blood pressure 123/82, pulse 76, temperature 98.7 F (37.1 C), resp. rate 20, weight 97.1 kg, SpO2 97 %.  General: No acute distress Mood and affect are appropriate Heart: Regular rate and rhythm no rubs murmurs or extra sounds Lungs: Clear to auscultation, breathing unlabored, no rales or wheezes Abdomen: Positive bowel sounds, soft nontender to palpation, nondistended Extremities: No clubbing, cyanosis, or edema Neuro- intrinsic atrophy of feet bilaterally , sensation intact to LT but absent prorio at toes, with intact proprio at ankles   Tremor mild , no ataxia or asterixis Musculoskeletal:severe chronic valgus defornity left foot and ankle , pes planus    Assessment/Plan: 1. Functional deficits secondary to debility Urosepsis which require 3+ hours per day of interdisciplinary therapy in a comprehensive inpatient rehab setting.  Physiatrist is providing close team supervision and 24 hour management of active medical problems listed below.  Physiatrist and rehab team continue to assess barriers to discharge/monitor patient progress toward functional and medical goals  Care Tool:  Bathing    Body parts bathed by patient: Right arm, Left arm, Chest,  Abdomen, Front perineal area, Buttocks, Right upper leg, Left upper leg, Left lower leg, Right lower leg, Face         Bathing assist Assist Level: Minimal Assistance - Patient > 75%     Upper Body Dressing/Undressing Upper body dressing   What is the patient wearing?: Pull over shirt    Upper body assist Assist Level: Set up assist    Lower Body Dressing/Undressing Lower body dressing      What is the patient wearing?: Pants, Underwear/pull up     Lower body assist Assist for lower body dressing: Minimal Assistance - Patient > 75%     Toileting Toileting    Toileting assist Assist for toileting: Independent with assistive device Assistive Device Comment: urinal   Transfers Chair/bed transfer  Transfers assist     Chair/bed transfer assist level: Minimal Assistance - Patient > 75%     Locomotion Ambulation   Ambulation assist      Assist level: Contact Guard/Touching assist Assistive device: Walker-rolling Max distance: 150'   Walk 10 feet activity   Assist     Assist level: Contact Guard/Touching assist Assistive device: Walker-rolling   Walk 50 feet activity   Assist    Assist level: Contact Guard/Touching assist Assistive device: Walker-rolling    Walk 150 feet activity   Assist    Assist level: Contact Guard/Touching assist Assistive device: Walker-rolling    Walk 10 feet on uneven surface  activity   Assist Walk 10 feet on uneven surfaces activity did not occur:  Safety/medical concerns         Wheelchair     Assist Will patient use wheelchair at discharge?: No Type of Wheelchair: Manual    Wheelchair assist level: Supervision/Verbal cueing Max wheelchair distance: 100    Wheelchair 50 feet with 2 turns activity    Assist        Assist Level: Supervision/Verbal cueing   Wheelchair 150 feet activity     Assist  Wheelchair 150 feet activity did not occur: Safety/medical concerns       Blood  pressure 123/82, pulse 76, temperature 98.7 F (37.1 C), resp. rate 20, weight 97.1 kg, SpO2 97 %.  Medical Problem List and Plan: 1. Debility secondary to bacterial urosepsis,              -patient may shower             -ELOS/Goals: ELOS 7/16 2.  Antithrombotics: -DVT/anticoagulation:  Pharmaceutical: Heparin             -antiplatelet therapy: N/A 3. Pain Management:  Tramadol or Tylenol prn. Currently well controlled.  4. Mood:  LCSW to follow for evaluation and support.              -antipsychotic agents: N/A 5. Neuropsych: This patient appears to be capable of making decisions on his own behalf currently.  6. Skin/Wound Care: Routine pressure relief measures.  7. Fluids/Electrolytes/Nutrition: continue supplements--will increase prostat to tid.   8. Enterococcal bacteremia: Continue Zyvox for 2 weeks with with end date 7/11. Reports loose stools today--will d/c colace.  9. Obstructive lung disease/underlying scoliosis: Pulmonary hygiene and encourage OOB activity.  10. Hypercarbic respiratory failure/OSA:  Nees to use CPAP whenever napping and at nights.  11. Macrocytic anemia/Vitamin B 12 deficiency: On  Vitamin B 12 supplement. H/H in 7-8 range , ETOH can also cause macrocytosis   12.  Acute renal failure: Due to infection has resolved.  13. Chronic heart failure with preserved EF: Add low salt restrictions. Will monitor for signs of overload with daily weights and strict I/O. Continue metoprolol.  17. H/o "Excessive Alcohol use" /Hepatic steatosis: Abnormal LFTs resolving. Recheck in am. ETOH neuropathy likely as well , improvement in ataxia in part related to abstinence 15. Hypokalemia: Resolved with supplementation. Recheck in am.  16. Hypomagnesemia: Chronic due to malnutrition? Has had IV supplementation but still 1.6. Continue oral supplement--recheck labs in am.   17. Hypophosphatemia: Recheck levels in am.  18. Right 5th MT fx: WBAT with carbon enforced shoes plates.  19.  Essential tremor: Mild exacerbated by anxiety On Xanax 0.5mg  daily prn. Would avoid benzos, may consider SSRI, pt had nausea with prior SSRI, can use prn benzo, used sparingly at home per pt and spouse 53. HTN: Continue amlodipine 5mg  daily.     LOS: 2 days A FACE TO FACE EVALUATION WAS PERFORMED  Charlett Blake 07/09/2019, 7:05 AM

## 2019-07-09 NOTE — Care Management (Signed)
[name], male   DOB: 18-Jun-1955, 64 y.o.   MRN: [id number] Met with patient to review role of CM and collaboration with SWMargreta Journey) to facilitate preparation for discharge. Reviewed risks/complications from co-morbidities including diastolic heart failure, HTN and Vit B 12 deficiency. Reviewed zones and daily weight check rationale along with exercise. Denies problems with constipation or pain at present. Continue to follow along for nursing concerns.

## 2019-07-09 NOTE — Progress Notes (Signed)
Occupational Therapy Session Note  Patient Details  Name: Louis [name] MRN: [id number] Date of Birth: 08-09-1955  Today's Date: 07/09/2019 OT Individual Time: 0814-4818 OT Individual Time Calculation (min): 74 min    Short Term Goals: Week 1:  OT Short Term Goal 1 (Week 1): STGs equal to LTGs set a supervision to modified independent level.  Skilled Therapeutic Interventions/Progress Updates:    Pt in the recliner to start session.  He was able to ambulate to the bathroom with use of the RW and min guard assist to begin session for toileting.  He completed all aspects of toileting with min guard assist as well and transferred out to the sink with use of the RW to complete washing his hands and brushing his teeth with supervision.  He then transferred to the wheelchair and was taken down to the ADL apartment for session.  He was able to complete simulated walk-in shower transfers with use of the RW and the shower seat for support at min guard assist level.  Also had him complete transfers stepping over the simulated edge with min guard assist as well to work on balance.  Progressed to working on standing balance with use of the [employer].  He was able to stand on a solid surface for 2 min intervals with an average amount of reaction time at 1.3-1.6 seconds.  He was able to complete two intervals standing on the foam surface with min assist needed for stepping on and off of the foam secondary to LOB.  He needed min assist on one occasion for maintaining balance while completing two minute interval.  Noted dyspnea 3/4 after each interval with O2 sats at 95% on room air and HR at 98 BPM.  Finished session with transition back to the room via wheelchair and pt transferring to the recliner with min guard assist and no device.  Call button and phone in reach with safety alarm belt in place.    Therapy Documentation Precautions:  Precautions Precautions: Fall Restrictions Weight Bearing Restrictions: No  Other Position/Activity Restrictions: WBAT in shoes with carbon plate inserts per wife  Pain: Pain Assessment Pain Scale: Faces Pain Score: 0-No pain ADL:  See Care Tool Section for some details of mobility and selfcare   Therapy/Group: Individual Therapy  Dortha Neighbors OTR/L 07/09/2019, 3:43 PM

## 2019-07-10 ENCOUNTER — Inpatient Hospital Stay (HOSPITAL_COMMUNITY): Payer: 59 | Admitting: Occupational Therapy

## 2019-07-10 ENCOUNTER — Inpatient Hospital Stay (HOSPITAL_COMMUNITY): Payer: 59 | Admitting: Physical Therapy

## 2019-07-10 NOTE — Progress Notes (Signed)
[place] PHYSICAL MEDICINE & REHABILITATION PROGRESS NOTE   Subjective/Complaints: No new issues overnight tolerated therapy was a little bit tired after 75-minute sessions ROS neg CP, SOB, N/V/D  Objective:   No results found. Recent Labs    07/08/19 0538  WBC 5.9  HGB 9.5*  HCT 29.3*  PLT 438*   Recent Labs    07/08/19 0538  NA 135  K 3.9  CL 99  CO2 27  GLUCOSE 99  BUN 9  CREATININE 0.80  CALCIUM 9.4    Intake/Output Summary (Last 24 hours) at 07/10/2019 0731 Last data filed at 07/10/2019 1324 Gross per 24 hour  Intake --  Output 1450 ml  Net -1450 ml     Physical Exam: Vital Signs Blood pressure 130/73, pulse 84, temperature (!) 97.5 F (36.4 C), temperature source Oral, resp. rate 20, weight 97.6 kg, SpO2 96 %.   General: No acute distress Mood and affect are appropriate Heart: Regular rate and rhythm no rubs murmurs or extra sounds Lungs: Clear to auscultation, breathing unlabored, no rales or wheezes Abdomen: Positive bowel sounds, soft nontender to palpation, nondistended Extremities: No clubbing, cyanosis, or edema Skin: No evidence of breakdown, no evidence of rash Neuro- foot intrinsic atrophy, intact LT but absent proprioception in feet  Tremor mild , no ataxia or asterixis Musculoskeletal:severe chronic valgus defornity left foot and ankle , pes planus    Assessment/Plan: 1. Functional deficits secondary to debility Urosepsis which require 3+ hours per day of interdisciplinary therapy in a comprehensive inpatient rehab setting.  Physiatrist is providing close team supervision and 24 hour management of active medical problems listed below.  Physiatrist and rehab team continue to assess barriers to discharge/monitor patient progress toward functional and medical goals  Care Tool:  Bathing    Body parts bathed by patient: Right arm, Left arm, Chest, Abdomen, Front perineal area, Buttocks, Right upper leg, Left upper leg, Left lower leg,  Right lower leg, Face         Bathing assist Assist Level: Minimal Assistance - Patient > 75%     Upper Body Dressing/Undressing Upper body dressing   What is the patient wearing?: Pull over shirt    Upper body assist Assist Level: Supervision/Verbal cueing    Lower Body Dressing/Undressing Lower body dressing      What is the patient wearing?: Underwear/pull up, Pants     Lower body assist Assist for lower body dressing: Set up assist     Toileting Toileting    Toileting assist Assist for toileting: Independent with assistive device Assistive Device Comment: urinal   Transfers Chair/bed transfer  Transfers assist     Chair/bed transfer assist level: Set up assist     Locomotion Ambulation   Ambulation assist      Assist level: Minimal Assistance - Patient > 75% Assistive device: No Device Max distance: 70ft   Walk 10 feet activity   Assist     Assist level: Minimal Assistance - Patient > 75% Assistive device: No Device   Walk 50 feet activity   Assist    Assist level: Minimal Assistance - Patient > 75% Assistive device: No Device    Walk 150 feet activity   Assist    Assist level: Contact Guard/Touching assist Assistive device: Walker-rolling    Walk 10 feet on uneven surface  activity   Assist Walk 10 feet on uneven surfaces activity did not occur: Safety/medical concerns         Wheelchair  Assist Will patient use wheelchair at discharge?: No Type of Wheelchair: Manual    Wheelchair assist level: Supervision/Verbal cueing Max wheelchair distance: 100    Wheelchair 50 feet with 2 turns activity    Assist        Assist Level: Supervision/Verbal cueing   Wheelchair 150 feet activity     Assist  Wheelchair 150 feet activity did not occur: Safety/medical concerns       Blood pressure 130/73, pulse 84, temperature (!) 97.5 F (36.4 C), temperature source Oral, resp. rate 20, weight 97.6 kg,  SpO2 96 %.  Medical Problem List and Plan: 1. Debility secondary to bacterial urosepsis,              -patient may shower             -ELOS/Goals: ELOS 7/16 2.  Antithrombotics: -DVT/anticoagulation:  Pharmaceutical: Heparin             -antiplatelet therapy: N/A 3. Pain Management:  Tramadol or Tylenol prn. Currently well controlled.  4. Mood:  LCSW to follow for evaluation and support.              -antipsychotic agents: N/A 5. Neuropsych: This patient appears to be capable of making decisions on his own behalf currently.  6. Skin/Wound Care: Routine pressure relief measures.  7. Fluids/Electrolytes/Nutrition: continue supplements--will increase prostat to tid.   8. Enterococcal bacteremia: Continue Zyvox for 2 weeks with with end date 7/11. Reports loose stools today--will d/c colace.  9. Obstructive lung disease/underlying scoliosis: Pulmonary hygiene and encourage OOB activity.  10. Hypercarbic respiratory failure/OSA:  Nees to use CPAP whenever napping and at nights.  11. Macrocytic anemia/Vitamin B 12 deficiency: On  Vitamin B 12 supplement. H/H in 7-8 range , ETOH can also cause macrocytosis   12.  Acute renal failure: Due to infection has resolved.  13. Chronic heart failure with preserved EF: Add low salt restrictions. Will monitor for signs of overload with daily weights and strict I/O. Continue metoprolol.  74. H/o "Excessive Alcohol use" /Hepatic steatosis: Abnormal LFTs resolving. Recheck in am. ETOH neuropathy likely as well , improvement in ataxia in part related to abstinence 15. Hypokalemia: Resolved with supplementation. Recheck in am.  16. Hypomagnesemia: Chronic due to malnutrition? Has had IV supplementation but still 1.6. Continue oral supplement--recheck labs in am.   17. Hypophosphatemia: Recheck levels in am.  18. Right 5th MT fx: WBAT with carbon enforced shoes plates.  19. Essential tremor: Mild exacerbated by anxiety On Xanax 0.5mg  daily prn. Would avoid benzos,  may consider SSRI, pt had nausea with prior SSRI, can use prn benzo, used sparingly at home per pt and spouse 44. HTN: Continue amlodipine 5mg  daily.  Vitals:   07/09/19 2054 07/10/19 0421  BP:  130/73  Pulse: 85 84  Resp: 18 20  Temp:  (!) 97.5 F (36.4 C)  SpO2: 97% 96%  Controlled, 7/9    LOS: 3 days A FACE TO FACE EVALUATION WAS PERFORMED  Charlett Blake 07/10/2019, 7:31 AM

## 2019-07-10 NOTE — Plan of Care (Signed)
  Problem: Consults Goal: RH GENERAL PATIENT EDUCATION Description: See Patient Education module for education specifics. Outcome: Progressing   Problem: RH SAFETY Goal: RH STG ADHERE TO SAFETY PRECAUTIONS W/ASSISTANCE/DEVICE Description: STG Adhere to Safety Precautions With cues and reminders Outcome: Progressing Goal: RH STG DECREASED RISK OF FALL WITH ASSISTANCE Description: STG Decreased Risk of Fall With cues and reminders Outcome: Progressing   Problem: RH PAIN MANAGEMENT Goal: RH STG PAIN MANAGED AT OR BELOW PT'S PAIN GOAL Description: Pain level less than 4 on scale of 0-10 Outcome: Progressing   Problem: RH KNOWLEDGE DEFICIT GENERAL Goal: RH STG INCREASE KNOWLEDGE OF SELF CARE AFTER HOSPITALIZATION Description: Pt will be able to demonstrate understanding of precautions to take to prevent falls and injuries with mod I assist. Pt will be able to demonstrate understanding of medication management, dietary and lifestyle modification to better control blood pressure and prevent further complication independently.  Outcome: Progressing

## 2019-07-10 NOTE — Plan of Care (Signed)
  Problem: Consults Goal: RH GENERAL PATIENT EDUCATION Description: See Patient Education module for education specifics. 07/10/2019 1131 by Renda Rolls L, LPN Outcome: Progressing 07/10/2019 1130 by Renda Rolls L, LPN Outcome: Progressing   Problem: RH SAFETY Goal: RH STG ADHERE TO SAFETY PRECAUTIONS W/ASSISTANCE/DEVICE Description: STG Adhere to Safety Precautions With cues and reminders 07/10/2019 1131 by Nkechi Linehan L, LPN Outcome: Progressing 07/10/2019 1130 by Renda Rolls L, LPN Outcome: Progressing Goal: RH STG DECREASED RISK OF FALL WITH ASSISTANCE Description: STG Decreased Risk of Fall With cues and reminders 07/10/2019 1131 by Renda Rolls L, LPN Outcome: Progressing 07/10/2019 1130 by Renda Rolls L, LPN Outcome: Progressing   Problem: RH PAIN MANAGEMENT Goal: RH STG PAIN MANAGED AT OR BELOW PT'S PAIN GOAL Description: Pain level less than 4 on scale of 0-10 07/10/2019 1131 by Shadiyah Wernli L, LPN Outcome: Progressing 07/10/2019 1130 by Renda Rolls L, LPN Outcome: Progressing   Problem: RH KNOWLEDGE DEFICIT GENERAL Goal: RH STG INCREASE KNOWLEDGE OF SELF CARE AFTER HOSPITALIZATION Description: Pt will be able to demonstrate understanding of precautions to take to prevent falls and injuries with mod I assist. Pt will be able to demonstrate understanding of medication management, dietary and lifestyle modification to better control blood pressure and prevent further complication independently.  07/10/2019 1131 by Renda Rolls L, LPN Outcome: Progressing 07/10/2019 1130 by Sanda Linger, LPN Outcome: Progressing

## 2019-07-10 NOTE — Progress Notes (Signed)
Physical Therapy Session Note  Patient Details  Name: Louis [name] MRN: [id number] Date of Birth: 19-Oct-1955  Today's Date: 07/10/2019 PT Individual Time: 0907-1002 and 7169-6789 PT Individual Time Calculation (min): 55 min and 59 min  Short Term Goals: Week 1:  PT Short Term Goal 1 (Week 1): STG = LTGs due to LOS  Skilled Therapeutic Interventions/Progress Updates:    Session 1: Pt received sitting in recliner and agreeable to therapy session. Gait training ~254ft to main therapy gym using RW with CGA for steadying - reciprocal stepping pattern with slightly wider BOS. Gait training 270ft no AD - demonstrates progressively wider BOS and slightly increased anterior trunk lean with fatigue. Pam, PA present to suggesting possibility of toe-up brace but based on pt's gait mechanics do not feel that is needed at this time.   Performed the following B LE strengthening and cardiovascular endurance tasks: - repeated sit<>stands to/from elevated EOM no UE support x8 reps then x10 reps with CGA for steadying - min cuing for form - standing at steps with B UE support on HRs repeated foot taps on 2nd step wearing 7lb ankle weight 2x10reps - lateral side stepping at hallway rail ~2ft down/back without UE support - min assist for safety - backwards walking at hallway rail no UE support with min assist for balance - demonstrates further R LE step length compared to L  Dynamic standing balance tasks: - attempted 3 cone foot taps but pt unable to focus vision looking down  - transitioned to attempting alternate  BLE foot taps on 6" step with pt requiring min assist and still some blurred vision but able to perform ~6 reps before having to stop due to vision Pt initiated side stepping over hockey stick but due to B LE fatigued deferred task at this time for safety. After therapeutic rest break ambulated ~212ft back to room using RW with CGA/min assist for steadying/balance - continues to demonstrate above gait  impairments. Pt left seated in recliner with needs in reach.    Session 2: Pt received sitting in recliner and agreeable to therapy session. Pt reports he enjoyed the OT dance group. Sit<>stands, no AD, with CGA for steadying during session. Gait training ~220ft to main therapy gym, no AD, with CGA intermittent min assist for balance - with fatigue had a few instances of anterior trunk lean and slightly wider BOS. Discussed with pt his gait pattern of slightly wider BOS and determined this may be pt's normal gait due to scoliosis causing COM to be more laterally displaced. Dynamic standing balance tasks of:  - R/L lateral side stepping over hockey stick with pt initially requiring min assist for balance progressed to CGA - forward/backwards stepping over hockey stick leading with R then L LE with CGA/min assist for steadying/balance - progressed to 4-square stepping over hockey sticks with min assist for balance - had increased difficulty stepping counter-clockwise as compared to clockwise but this may be due to increasing B LE fatigue Discussed AD recommendations with therapist suggesting option of rollator as this would allow energy conservation while also providing a seat for pt to rest during longer distance ambulation - pt agreeable to try. Therapist educated pt on proper use of rollator breaks and seat against a wall for safety - adjusted height of AD for improved pt fit. Gait training ~165ft to/from ADL restroom using rollator with pt initially having difficulty controlling AD as it moves much more easily than RW and pt having to adjust but with  practice demonstrates safe management of device. Sit<>stand with CGA/close supervision, LB clothing management without assist, and continent of bladder. Ambulated ~274ft back to room using rollator with CGA for steadying - cuing and education to maintain reciprocal gait pattern and avoid flexing trunk forward when using AD. Performed seated B LE ankle DF  against level 3 theraband resistance x15 reps each. Pt left seated in recliner with needs in reach.       Therapy Documentation Precautions:  Precautions Precautions: Fall Restrictions Weight Bearing Restrictions: No Other Position/Activity Restrictions: WBAT in shoes with carbon plate inserts per wife  Pain:   Session 1: Reports some muscle soreness after yesterdays therapy sessions but otherwise no reports of pain.  Session 2: No reports of pain throughout session.   Therapy/Group: Individual Therapy  Tawana Scale, PT, DPT, CSRS  07/10/2019, 7:47 AM

## 2019-07-10 NOTE — Progress Notes (Signed)
Occupational Therapy Session Note  Patient Details  Name: Louis [name] MRN: [id number] Date of Birth: 13-Jan-1955  Today's Date: 07/10/2019 OT Group Time: 1100-1200 OT Group Time Calculation (min): 60 min  Skilled Therapeutic Interventions/Progress Updates:    Pt engaged in therapeutic w/c level dance group focusing on patient choice, UE/LE strengthening, salience, activity tolerance, and social participation. Pt was guided through various dance-based exercises involving UEs/LEs and trunk. All music was selected by group members. Emphasis placed on general strengthening and dynamic standing balance. Pt was actively engaged throughout session, requested a favorite song, and he stood with OT to dance with unilateral support on the back of his w/c or without UE supported. At end of session pt was escorted back to room and transferred to the recliner with CGA. Left him with all needs within reach.    Therapy Documentation Precautions:  Precautions Precautions: Fall Restrictions Weight Bearing Restrictions: No Other Position/Activity Restrictions: WBAT in shoes with carbon plate inserts per wife Pain: no s/s pain during tx Pain Assessment Pain Scale: 0-10 Pain Score: 0-No pain ADL: ADL Eating: Independent Where Assessed-Eating: Wheelchair Grooming: Contact guard Where Assessed-Grooming: Standing at sink Upper Body Bathing: Setup Where Assessed-Upper Body Bathing: Shower Lower Body Bathing: Minimal assistance Where Assessed-Lower Body Bathing: Shower Upper Body Dressing: Setup Where Assessed-Upper Body Dressing: Chair Lower Body Dressing: Minimal assistance Where Assessed-Lower Body Dressing: Chair Toileting: Minimal assistance Where Assessed-Toileting: Glass blower/designer: Psychiatric nurse Method: Counselling psychologist: Grab bars, Raised toilet seat Social research officer, government: Environmental education officer Method: Conservation officer, historic buildings: Shower seat with back      Therapy/Group: Group Therapy  [employer] 07/10/2019, 12:50 PM

## 2019-07-11 ENCOUNTER — Inpatient Hospital Stay (HOSPITAL_COMMUNITY): Payer: 59 | Admitting: Physical Therapy

## 2019-07-11 ENCOUNTER — Inpatient Hospital Stay (HOSPITAL_COMMUNITY): Payer: 59 | Admitting: Occupational Therapy

## 2019-07-11 DIAGNOSIS — R7881 Bacteremia: Secondary | ICD-10-CM

## 2019-07-11 DIAGNOSIS — D539 Nutritional anemia, unspecified: Secondary | ICD-10-CM

## 2019-07-11 MED ORDER — MAGNESIUM GLUCONATE 500 MG PO TABS
500.0000 mg | ORAL_TABLET | Freq: Two times a day (BID) | ORAL | Status: DC
  Administered 2019-07-11 – 2019-07-13 (×4): 500 mg via ORAL
  Filled 2019-07-11 (×4): qty 1

## 2019-07-11 NOTE — Progress Notes (Signed)
Occupational Therapy Session Note  Patient Details  Name: [name] MRN: [id number] Date of Birth: [birth year]  Today's Date: 07/11/2019   OT Individual Time:945-11:00 (75 min )  1305-1330 OT Individual Time Calculation (min): 25 min    Short Term Goals: Week 1:  OT Short Term Goal 1 (Week 1): STGs equal to LTGs set a supervision to modified independent level.  Skilled Therapeutic Interventions/Progress Updates:    1st session: Pt completed self care retraining at shower level. Pt navigated around the room without use of AE/ DME with close supervision to contact guard. PT able to doff clothing and bathe sit to stand with distant supervision. Pt is encouraged to only ambulate with his shoes with foot support in them for optimal support. Pt does continue to practice this. Pt donns clothing with setup A. Pt performed grooming at the sink sit to stand with use of Rollator. Recommended pt to use Rollator with longer distances in the community and for stability. Pt ambulated to the gym ~ 200 feet with supervision. Pt performed task of stepping up onto 6 inch step bilaterally and them back down for one minute with one hand rail (on the right) for endurance, cardiopulmonary exercise and for dyanmic balance with contact guard. After rest break performed 5x without UE support further challenging his balance. Performed continued exercise sit to stand from mat at various heights without UE support while throwing 2 lb ball with rebounder. The lower the surface the more difficulty coming into standing without UE support. Ambulated back to room with Rollator with supervision. Pt's Rollator does catch the corner of object when backing up/ turning tight corner- but with time pt able to correct.   Left resting in the recliner  2nd session continued to address functional mobility in community outdoor environment. Rode w/c outside to conserve energy. Practiced ambulating with Rollator on brick and pavement uneven  surface, navigating up and down a curb with instructional cues for management of Rollator. Also navigated through grass and uneven softer ground with Rollator. Recommended using AD when outdoors for increased confidence and safety with uneven surface. Wife present and observed session. Pt able to ambulate after seated rest break from outdoor patio all the wall back to room with seated rest break in the elevator.   Left resting in the recliner.    Therapy Documentation Precautions:  Precautions Precautions: Fall Restrictions Weight Bearing Restrictions: No Other Position/Activity Restrictions: WBAT in shoes with carbon plate inserts per wife Pain: Pain Assessment Pain Scale: 0-10 Pain Score: 0-No pain   Therapy/Group: Individual Therapy  Adnan, [facility] 07/11/2019, 3:09 PM

## 2019-07-11 NOTE — Progress Notes (Signed)
[facility] PHYSICAL MEDICINE & REHABILITATION PROGRESS NOTE  Subjective/Complaints: Patient seen sitting up in his chair this morning.  He states he slept well overnight.  He is in good spirits.  He denies complaints.  ROS: Denies CP, SOB, N/V/D  Objective:   No results found. No results for input(s): WBC, HGB, HCT, PLT in the last 72 hours. No results for input(s): NA, K, CL, CO2, GLUCOSE, BUN, CREATININE, CALCIUM in the last 72 hours.  Intake/Output Summary (Last 24 hours) at 07/11/2019 1340 Last data filed at 07/11/2019 0900 Gross per 24 hour  Intake 640 ml  Output 2350 ml  Net -1710 ml     Physical Exam: Vital Signs Blood pressure 120/65, pulse 80, temperature 98.9 F (37.2 C), temperature source Oral, resp. rate 18, weight 97.6 kg, SpO2 97 %. Constitutional: No distress . Vital signs reviewed. HENT: Normocephalic.  Atraumatic. Eyes: EOMI. No discharge. Cardiovascular: No JVD. Respiratory: Normal effort.  No stridor. GI: Non-distended. Skin: Warm and dry.  Intact. Psych: Normal mood.  Normal behavior. Musc: No edema in extremities.  No tenderness in extremities. Neuro: Alert Grossly 4/5 throughout  Assessment/Plan: 1. Functional deficits secondary to debility Urosepsis which require 3+ hours per day of interdisciplinary therapy in a comprehensive inpatient rehab setting.  Physiatrist is providing close team supervision and 24 hour management of active medical problems listed below.  Physiatrist and rehab team continue to assess barriers to discharge/monitor patient progress toward functional and medical goals  Care Tool:  Bathing    Body parts bathed by patient: Right arm, Left arm, Chest, Abdomen, Front perineal area, Buttocks, Right upper leg, Left upper leg, Left lower leg, Right lower leg, Face         Bathing assist Assist Level: Minimal Assistance - Patient > 75%     Upper Body Dressing/Undressing Upper body dressing   What is the patient wearing?:  Pull over shirt    Upper body assist Assist Level: Supervision/Verbal cueing    Lower Body Dressing/Undressing Lower body dressing      What is the patient wearing?: Underwear/pull up, Pants     Lower body assist Assist for lower body dressing: Set up assist     Toileting Toileting    Toileting assist Assist for toileting: Independent with assistive device Assistive Device Comment: urinal   Transfers Chair/bed transfer  Transfers assist     Chair/bed transfer assist level: Contact Guard/Touching assist     Locomotion Ambulation   Ambulation assist      Assist level: Minimal Assistance - Patient > 75% Assistive device: No Device Max distance: 24ft   Walk 10 feet activity   Assist     Assist level: Contact Guard/Touching assist Assistive device: No Device   Walk 50 feet activity   Assist    Assist level: Contact Guard/Touching assist Assistive device: No Device    Walk 150 feet activity   Assist    Assist level: Minimal Assistance - Patient > 75% Assistive device: No Device    Walk 10 feet on uneven surface  activity   Assist Walk 10 feet on uneven surfaces activity did not occur: Safety/medical concerns         Wheelchair     Assist Will patient use wheelchair at discharge?: No Type of Wheelchair: Manual    Wheelchair assist level: Supervision/Verbal cueing Max wheelchair distance: 100    Wheelchair 50 feet with 2 turns activity    Assist        Assist Level: Supervision/Verbal  cueing   Wheelchair 150 feet activity     Assist  Wheelchair 150 feet activity did not occur: Safety/medical concerns       Blood pressure 120/65, pulse 80, temperature 98.9 F (37.2 C), temperature source Oral, resp. rate 18, weight 97.6 kg, SpO2 97 %.  Medical Problem List and Plan: 1. Debility secondary to bacterial urosepsis   Continue CIR  2.  Antithrombotics: -DVT/anticoagulation:  Pharmaceutical: Heparin              -antiplatelet therapy: N/A 3. Pain Management:  Tramadol or Tylenol prn.   Controlled on 7/10 4. Mood:  LCSW to follow for evaluation and support.              -antipsychotic agents: N/A 5. Neuropsych: This patient capable of making decisions on his own behalf currently.  6. Skin/Wound Care: Routine pressure relief measures.  7. Fluids/Electrolytes/Nutrition: continue supplements--increased prostat to tid.   8. Enterococcal bacteremia: Continue Zyvox for 2 weeks with with end date 7/11.  9. Obstructive lung disease/underlying scoliosis: Pulmonary hygiene and encourage OOB activity.  10. Hypercarbic respiratory failure/OSA:  Nees to use CPAP whenever napping and at nights.  11. Macrocytic anemia/Vitamin B 12 deficiency: On  Vitamin B 12 supplement.   Hemoglobin 9.5 on 7/7  Continue to monitor 12.  Acute renal failure: Due to infection has resolved.  13. Chronic heart failure with preserved EF: Added low salt restrictions. Will monitor for signs of overload with daily weights and strict I/O. Continue metoprolol.  Filed Weights   07/08/19 0503 07/09/19 0538 07/10/19 0453  Weight: 92.9 kg 97.1 kg 97.6 kg   No weights for today 14. H/o "Excessive Alcohol use" /Hepatic steatosis:   LFTs within normal limits on 7/7   ETOH neuropathy likely as well 15. Hypokalemia: Resolved with supplementation.   Potassium 3.9 on 7/7 16. Hypomagnesemia: Chronic due to malnutrition?    Oral supplement changed and increased on 7/10   Magnesium 1.6 on 7/7, labs ordered for Monday 17. Hypophosphatemia:   Phosphorus within normal limits on 7/7 18. Right 5th MT fx: WBAT with carbon enforced shoes plates.  19. Essential tremor: Mild exacerbated by anxiety on Xanax 0.5mg  daily prn. Would avoid benzos, may consider SSRI, pt had nausea with prior SSRI, can use prn benzo, used sparingly at home per pt and spouse 20. HTN: Continue amlodipine 5mg  daily.  Vitals:   07/10/19 1947 07/11/19 0539  BP: 120/72 120/65   Pulse: 77 80  Resp: 18 18  Temp: 98.2 F (36.8 C) 98.9 F (37.2 C)  SpO2: 99% 97%   Controlled on 7/10  LOS: 4 days A FACE TO FACE EVALUATION WAS PERFORMED  Geroldine Esquivias Lorie Phenix 07/11/2019, 1:40 PM

## 2019-07-11 NOTE — Plan of Care (Signed)
  Problem: Consults Goal: RH GENERAL PATIENT EDUCATION Description: See Patient Education module for education specifics. Outcome: Progressing   Problem: RH SAFETY Goal: RH STG ADHERE TO SAFETY PRECAUTIONS W/ASSISTANCE/DEVICE Description: STG Adhere to Safety Precautions With cues and reminders Outcome: Progressing Goal: RH STG DECREASED RISK OF FALL WITH ASSISTANCE Description: STG Decreased Risk of Fall With cues and reminders Outcome: Progressing   Problem: RH PAIN MANAGEMENT Goal: RH STG PAIN MANAGED AT OR BELOW PT'S PAIN GOAL Description: Pain level less than 4 on scale of 0-10 Outcome: Progressing   Problem: RH KNOWLEDGE DEFICIT GENERAL Goal: RH STG INCREASE KNOWLEDGE OF SELF CARE AFTER HOSPITALIZATION Description: Pt will be able to demonstrate understanding of precautions to take to prevent falls and injuries with mod I assist. Pt will be able to demonstrate understanding of medication management, dietary and lifestyle modification to better control blood pressure and prevent further complication independently.  Outcome: Progressing

## 2019-07-11 NOTE — Progress Notes (Signed)
Physical Therapy Session Note  Patient Details  Name: Louis [name] MRN: [id number] Date of Birth: 10-21-1955  Today's Date: 07/11/2019 PT Individual Time: 9030-0923 and 1410-1504 PT Individual Time Calculation (min): 31 min and 54 min  Short Term Goals: Week 1:  PT Short Term Goal 1 (Week 1): STG = LTGs due to LOS  Skilled Therapeutic Interventions/Progress Updates:    Session 1: Pt received sitting in recliner performing B LE ankle PF exercise using theraband and excited to participate in therapy session. Sit<>stands, no AD, with CGA/close supervision for safety during session. Gait training ~241ft to therapy gym, no AD, with CGA for steadying - continues to demonstrates slightly wider BOS, slight increased unsteadiness, and slight lateral trunk lean in direction of stance limb (indiciating likely hip abductor weakness) but no LOB. Lateral side stepping at hallway rail ~70ft down/back x2, no UE support, with CGA for steadying - cuing for hip alignment to target hip abductors - requires standing rest breaks with UE support between each set. B LE strengthening task of lateral side stepping up/down on/off 1st step of stairs with 1 UE support and CGA for steadying. Gait training ~225ft back to room, no AD, with CGA for steadying and pt initially demonstrating improved gait mechanics with more narrow BOS and decreased lateral trunk lean on stance limb but with fatigue towards end of walk required min assist for safety and demonstrates wider BOS and decreased BLE step lengths and foot clearance. Pt left seated in recliner with needs in reach.  Session 2: Pt received sitting in recliner and agreeable to therapy session. Pt reports hip muscle soreness from earlier therapy sessions and bilateral feet soreness - based on this used rollator for ambulation to support LEs. Gait training ~221ft to main therapy gym using rollator with CGA for steadying - with fatigue has slight anterior trunk lean and decreased B LE  step lengths and foot clearance.  Performed the following LE stretches: - supine hip flexor stretch with LEs off EOM for increased stretch x85minutes.  - supine figure 4stretch 2x32minute each LE - supine single knee to chest 2x48minute each - supine straight leg hamstring stretch using gait belt 2x65minute Cuing throughout for proper form/technique. Printed this stretching HEP, including ankle PF/DF against theraband resistance, for pt to perform at home.  Rolling on mat with close supervision/CGA. In quadruped performed the following: alternate B LE straight leg raises, alternate B LE arm raises, progressed to bird dog (alternating contralateral UE and LE raises) with pt having some instability noted with this but improved upon 2nd attempt. Pt reports gratefulness for performing LE exercises this session. Gait training ~22ft back to room using rollator with CGA for safety/steading with pt demonstrating increasing fatigue towards end of walk. Pt left seated in recliner with needs in reach.  Therapy Documentation Precautions:  Precautions Precautions: Fall Restrictions Weight Bearing Restrictions: No Other Position/Activity Restrictions: WBAT in shoes with carbon plate inserts per wife  Pain:   Session 1: No reports of pain throughout session.  Session 2: Reports muscle soreness and B LE feet soreness but otherwise no complaints of pain during session.   Therapy/Group: Individual Therapy  Tawana Scale , PT, DPT, CSRS  07/11/2019, 7:48 AM

## 2019-07-12 ENCOUNTER — Inpatient Hospital Stay (HOSPITAL_COMMUNITY): Payer: 59 | Admitting: Occupational Therapy

## 2019-07-12 ENCOUNTER — Inpatient Hospital Stay (HOSPITAL_COMMUNITY): Payer: 59 | Admitting: Physical Therapy

## 2019-07-12 DIAGNOSIS — D539 Nutritional anemia, unspecified: Secondary | ICD-10-CM

## 2019-07-12 DIAGNOSIS — I10 Essential (primary) hypertension: Secondary | ICD-10-CM

## 2019-07-12 DIAGNOSIS — R7881 Bacteremia: Secondary | ICD-10-CM

## 2019-07-12 NOTE — Plan of Care (Signed)
  Problem: Consults Goal: RH GENERAL PATIENT EDUCATION Description: See Patient Education module for education specifics. Outcome: Progressing   Problem: RH SAFETY Goal: RH STG ADHERE TO SAFETY PRECAUTIONS W/ASSISTANCE/DEVICE Description: STG Adhere to Safety Precautions With cues and reminders Outcome: Progressing Goal: RH STG DECREASED RISK OF FALL WITH ASSISTANCE Description: STG Decreased Risk of Fall With cues and reminders Outcome: Progressing   Problem: RH PAIN MANAGEMENT Goal: RH STG PAIN MANAGED AT OR BELOW PT'S PAIN GOAL Description: Pain level less than 4 on scale of 0-10 Outcome: Progressing   Problem: RH KNOWLEDGE DEFICIT GENERAL Goal: RH STG INCREASE KNOWLEDGE OF SELF CARE AFTER HOSPITALIZATION Description: Pt will be able to demonstrate understanding of precautions to take to prevent falls and injuries with mod I assist. Pt will be able to demonstrate understanding of medication management, dietary and lifestyle modification to better control blood pressure and prevent further complication independently.  Outcome: Progressing

## 2019-07-12 NOTE — Progress Notes (Signed)
Patient was noted alert & responsive at the beginning of the shift. He was sitting in his recliner & stayed there until later that night. Upon assessment, she c/o itching to bil anterior arms in the antecubital area. There is redness & seeming inflammation. Asked if he told the physician & how long it had been there. He stated no, he had not told the provider & it has been a few days. He stated that he thought it was heat rash. No papules noted, just the marked redness. Communication was left for the provider for evaluation on rounds. No c/o pain or discomfort. He wore his CPAP last night No acute distress noted at this time. Will continue to monitor for changes

## 2019-07-12 NOTE — Progress Notes (Signed)
Occupational Therapy Session Note  Patient Details  Name: Louis [name] MRN: [id number] Date of Birth: 10/09/1955  Today's Date: 07/12/2019 OT Individual Time: 0705-0800 OT Individual Time Calculation (min): 55 min    Short Term Goals: Week 1:  OT Short Term Goal 1 (Week 1): STGs equal to LTGs set a supervision to modified independent level.  Skilled Therapeutic Interventions/Progress Updates:    Upon entering the room, pt seated in recliner chair awaiting OT arrival with no c/o pain and motivated for OT intervention. Pt standing from chair with supervision and ambulating 150' with CGA and use of rollator to ADL apartment. Pt seated in recliner chair , similar to home environment, and needing only supervision. Pt demonstrated transfer onto standard height bed with CGA and supervision for sit <>supine. Pt ambulating into kitchen to discuss home set up and use of rollator for safety and energy conservation with kitchen tasks. Pt very active participant in discussion. OT continued education of energy conservation and provided pt with paper handout for education. Pt returning back to room with rollator in same manner as above. Pt obtaining clothing items from bed and transferring them to dresser drawers with CGA. Pt seated in recliner chair with call bell and all needed items within reach.   Therapy Documentation Precautions:  Precautions Precautions: Fall Restrictions Weight Bearing Restrictions: No Other Position/Activity Restrictions: WBAT in shoes with carbon plate inserts per wife General:   Vital Signs: Therapy Vitals Pulse Rate: 85 BP: 110/69 Pain:   ADL: ADL Eating: Independent Where Assessed-Eating: Wheelchair Grooming: Contact guard Where Assessed-Grooming: Standing at sink Upper Body Bathing: Setup Where Assessed-Upper Body Bathing: Shower Lower Body Bathing: Minimal assistance Where Assessed-Lower Body Bathing: Shower Upper Body Dressing: Setup Where Assessed-Upper  Body Dressing: Chair Lower Body Dressing: Minimal assistance Where Assessed-Lower Body Dressing: Chair Toileting: Minimal assistance Where Assessed-Toileting: Glass blower/designer: Psychiatric nurse Method: Counselling psychologist: Grab bars, Raised toilet seat Social research officer, government: Minimal assistance Social research officer, government Method: Heritage manager: Civil engineer, contracting with back Glass blower/designer   Exercises:   Other Treatments:     Therapy/Group: Individual Therapy  Gypsy Decant 07/12/2019, 11:01 AM

## 2019-07-12 NOTE — Progress Notes (Signed)
Physical Therapy Session Note  Patient Details  Name: Louis [name] MRN: [id number] Date of Birth: 1955-05-04  Today's Date: 07/12/2019 PT Individual Time: 0900-0956 PT Individual Time Calculation (min): 56 min   Short Term Goals: Week 1:  PT Short Term Goal 1 (Week 1): STG = LTGs due to LOS  Skilled Therapeutic Interventions/Progress Updates: Pt presents sitting up in recliner and agreeable to therapy.  Pt performed multiple sit to stand transfers w/ supervision. From all surfaces.  Pt amb up to 150' per trial during session w/ CGA and increased BOS, probably d/t c/o soreness/stiffness to abductors/quads from exercise yesterday.  Pt amb w/ improved BOS as increased gait w/ stretching of sore muscles.  Pt tolerated 10' of Nu-step at Level 4 w/ BLEs only w/o c/o.  Pt performed standing marching w/ and w/o UE support (on back of chair only), sidestepping in parallel bars w/o UE support, amb up ramp and then across mulch w/o LOB.  Pt required seated rest breaks d/t fatigue.  Time for family ed Tuesday 10-12 placed on calender.  Pt returned to room and used BR to stand and continent of urine.  Pt returned to recliner w/ all needs in reach.     Therapy Documentation Precautions:  Precautions Precautions: Fall Restrictions Weight Bearing Restrictions: No Other Position/Activity Restrictions: WBAT in shoes with carbon plate inserts per wife General:   Vital Signs: Therapy Vitals Pulse Rate: 85 BP: 110/69 Pain:just states soreness to lateral hips/upper quads from PT.      Therapy/Group: Individual Therapy  Louis [name] 07/12/2019, 11:04 AM

## 2019-07-12 NOTE — Progress Notes (Addendum)
[facility] PHYSICAL MEDICINE & REHABILITATION PROGRESS NOTE  Subjective/Complaints: Patient seen sitting up in his chair this morning.  He states he slept well overnight.  He notes pruritus in his right antecubital fossa area.  No rash, notes improvement with lotion.  ROS: + Pruritus right antecubital fossa.  Denies CP, SOB, N/V/D  Objective:   No results found. No results for input(s): WBC, HGB, HCT, PLT in the last 72 hours. No results for input(s): NA, K, CL, CO2, GLUCOSE, BUN, CREATININE, CALCIUM in the last 72 hours.  Intake/Output Summary (Last 24 hours) at 07/12/2019 1620 Last data filed at 07/12/2019 1400 Gross per 24 hour  Intake --  Output 1950 ml  Net -1950 ml     Physical Exam: Vital Signs Blood pressure 114/69, pulse 80, temperature 98.3 F (36.8 C), resp. rate 16, weight 93.5 kg, SpO2 97 %. Constitutional: No distress . Vital signs reviewed. HENT: Normocephalic.  Atraumatic. Eyes: EOMI. No discharge. Cardiovascular: No JVD.  RRR. Respiratory: Normal effort.  No stridor.  Bilaterally clear to auscultation. GI: Non-distended.  BS +. Skin: Warm and dry.  Intact.  No rash noted in right arm. Psych: Normal mood.  Normal behavior. Musc: No edema in extremities.  No tenderness in extremities. Psych: Normal mood.  Normal behavior. Neuro: Alert Grossly 4/5 throughout, unchanged  Assessment/Plan: 1. Functional deficits secondary to debility Urosepsis which require 3+ hours per day of interdisciplinary therapy in a comprehensive inpatient rehab setting.  Physiatrist is providing close team supervision and 24 hour management of active medical problems listed below.  Physiatrist and rehab team continue to assess barriers to discharge/monitor patient progress toward functional and medical goals  Care Tool:  Bathing    Body parts bathed by patient: Right arm, Left arm, Chest, Abdomen, Front perineal area, Buttocks, Right upper leg, Left upper leg, Left lower leg, Right  lower leg, Face         Bathing assist Assist Level: Minimal Assistance - Patient > 75%     Upper Body Dressing/Undressing Upper body dressing   What is the patient wearing?: Pull over shirt    Upper body assist Assist Level: Independent    Lower Body Dressing/Undressing Lower body dressing      What is the patient wearing?: Pants     Lower body assist Assist for lower body dressing: Independent     Toileting Toileting    Toileting assist Assist for toileting: Independent with assistive device Assistive Device Comment: urinal   Transfers Chair/bed transfer  Transfers assist     Chair/bed transfer assist level: Supervision/Verbal cueing     Locomotion Ambulation   Ambulation assist      Assist level: Contact Guard/Touching assist (Simultaneous filing. User may not have seen previous data.) Assistive device: No Device (Simultaneous filing. User may not have seen previous data.) Max distance: 150 (Simultaneous filing. User may not have seen previous data.)   Walk 10 feet activity   Assist     Assist level: Contact Guard/Touching assist (Simultaneous filing. User may not have seen previous data.) Assistive device: No Device (Simultaneous filing. User may not have seen previous data.)   Walk 50 feet activity   Assist    Assist level: Contact Guard/Touching assist (Simultaneous filing. User may not have seen previous data.) Assistive device: No Device (Simultaneous filing. User may not have seen previous data.)    Walk 150 feet activity   Assist    Assist level: Contact Guard/Touching assist (Simultaneous filing. User may not have seen  previous data.) Assistive device: No Device (Simultaneous filing. User may not have seen previous data.)    Walk 10 feet on uneven surface  activity   Assist Walk 10 feet on uneven surfaces activity did not occur: Safety/medical concerns   Assist level: Contact Guard/Touching assist Assistive device: Other  (comment) (no device)   Wheelchair     Assist Will patient use wheelchair at discharge?: No Type of Wheelchair: Manual    Wheelchair assist level: Supervision/Verbal cueing Max wheelchair distance: 100    Wheelchair 50 feet with 2 turns activity    Assist        Assist Level: Supervision/Verbal cueing   Wheelchair 150 feet activity     Assist  Wheelchair 150 feet activity did not occur: Safety/medical concerns       Blood pressure 114/69, pulse 80, temperature 98.3 F (36.8 C), resp. rate 16, weight 93.5 kg, SpO2 97 %.  Medical Problem List and Plan: 1. Debility secondary to bacterial urosepsis   Continue CIR  2.  Antithrombotics: -DVT/anticoagulation:  Pharmaceutical: Heparin             -antiplatelet therapy: N/A 3. Pain Management:  Tramadol or Tylenol prn.   Controlled on 7/11 4. Mood:  LCSW to follow for evaluation and support.              -antipsychotic agents: N/A 5. Neuropsych: This patient capable of making decisions on his own behalf currently.  6. Skin/Wound Care: Routine pressure relief measures.  7. Fluids/Electrolytes/Nutrition: continue supplements--increased prostat to tid.   8. Enterococcal bacteremia: Continue Zyvox for 2 weeks with with end date 7/11.  9. Obstructive lung disease/underlying scoliosis: Pulmonary hygiene and encourage OOB activity.  10. Hypercarbic respiratory failure/OSA:  Nees to use CPAP whenever napping and at nights.  11. Macrocytic anemia/Vitamin B 12 deficiency: On  Vitamin B 12 supplement.   Hemoglobin 9.5 on 7/7  Continue to monitor 12.  Acute renal failure: Due to infection has resolved.  13. Chronic heart failure with preserved EF: Added low salt restrictions. Will monitor for signs of overload with daily weights and strict I/O. Continue metoprolol.  Filed Weights   07/09/19 0538 07/10/19 0453 07/12/19 0519  Weight: 97.1 kg 97.6 kg 93.5 kg   ? Reliability on 7/11 14. H/o "Excessive Alcohol use" /Hepatic  steatosis:   LFTs within normal limits on 7/7   ETOH neuropathy likely as well 15. Hypokalemia: Resolved with supplementation.   Potassium 3.9 on 7/7 16. Hypomagnesemia: Chronic due to malnutrition?    Oral supplement changed and increased on 7/10  Magnesium 1.6 on 7/7, labs ordered for tomorrow 17. Hypophosphatemia:   Phosphorus within normal limits on 7/7 18. Right 5th MT fx: WBAT with carbon enforced shoes plates.  19. Essential tremor: Mild exacerbated by anxiety on Xanax 0.5mg  daily prn. Would avoid benzos, may consider SSRI, pt had nausea with prior SSRI, can use prn benzo, used sparingly at home per pt and spouse 62. HTN: Continue amlodipine 5mg  daily.  Vitals:   07/12/19 0818 07/12/19 1527  BP: 110/69 114/69  Pulse: 85 80  Resp:    Temp:  98.3 F (36.8 C)  SpO2:  97%   Controlled on 7/11  LOS: 5 days A FACE TO FACE EVALUATION WAS PERFORMED  Ron Beske Lorie Phenix 07/12/2019, 4:20 PM

## 2019-07-13 ENCOUNTER — Inpatient Hospital Stay (HOSPITAL_COMMUNITY): Payer: 59 | Admitting: Occupational Therapy

## 2019-07-13 ENCOUNTER — Inpatient Hospital Stay (HOSPITAL_COMMUNITY): Payer: 59 | Admitting: Physical Therapy

## 2019-07-13 LAB — BASIC METABOLIC PANEL
Anion gap: 11 (ref 5–15)
BUN: 14 mg/dL (ref 8–23)
CO2: 24 mmol/L (ref 22–32)
Calcium: 9.3 mg/dL (ref 8.9–10.3)
Chloride: 100 mmol/L (ref 98–111)
Creatinine, Ser: 1.11 mg/dL (ref 0.61–1.24)
GFR calc Af Amer: >60 mL/min (ref >60)
GFR calc non Af Amer: >60 mL/min (ref >60)
Glucose, Bld: 99 mg/dL (ref 70–99)
Potassium: 4 mmol/L (ref 3.5–5.1)
Sodium: 135 mmol/L (ref 135–145)

## 2019-07-13 LAB — CBC
HCT: 29.3 % — ABNORMAL LOW (ref 39.0–52.0)
Hemoglobin: 9.3 g/dL — ABNORMAL LOW (ref 13.0–17.0)
MCH: 33.1 pg (ref 26.0–34.0)
MCHC: 31.7 g/dL (ref 30.0–36.0)
MCV: 104.3 fL — ABNORMAL HIGH (ref 80.0–100.0)
Platelets: 355 10*3/uL (ref 150–400)
RBC: 2.81 MIL/uL — ABNORMAL LOW (ref 4.22–5.81)
RDW: 13.6 % (ref 11.5–15.5)
WBC: 4.2 10*3/uL (ref 4.0–10.5)
nRBC: 0 % (ref 0.0–0.2)

## 2019-07-13 LAB — MAGNESIUM: Magnesium: 1.6 mg/dL — ABNORMAL LOW (ref 1.7–2.4)

## 2019-07-13 MED ORDER — MAGNESIUM GLUCONATE 500 MG PO TABS
500.0000 mg | ORAL_TABLET | Freq: Three times a day (TID) | ORAL | Status: DC
  Administered 2019-07-13 – 2019-07-17 (×12): 500 mg via ORAL
  Filled 2019-07-13 (×13): qty 1

## 2019-07-13 NOTE — Progress Notes (Signed)
Physical Therapy Session Note  Patient Details  Name: Louis [name] MRN: [id number] Date of Birth: 1955/09/04  Today's Date: 07/13/2019 PT Individual Time: 3419-6222 PT Individual Time Calculation (min): 72 min   Short Term Goals: Week 1:  PT Short Term Goal 1 (Week 1): STG = LTGs due to LOS  Skilled Therapeutic Interventions/Progress Updates:    Pt received sitting in recliner and eager to participate in therapy session. Pt does report he had significant B LE muscle fatigue yesterday. Pt showing therapist pictures of his home-entry (5 ~4" height steps with B HRs) and 15steps to 2nd floor with R HR untill approximately step #12 then having banisters on L side. Based on pictures, therapist recommended pt use back stairs with bilateral handrails as opposed to front stairs with only small R HR to enter home. Dietary in/out for meal orders and NT in/out for vital sign assessment. Sit<>stands with and without AD with supervision for safety throughout session. Gait training ~238ft to main gym using rollator with CGA/close supervision for safety. 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 Discussed pt planning to get in the floor on mat at in-home exercise room to perform stretching exercises therefore educated on proper floor transfer technique getting to/from sturdy chair. Pt demonstrates ability to get to/from floor 2x initially with CGA then supervision - pt demonstrating excellent understanding of technique. Ascended/descended 4 steps  (6" height) using B UE support on HRs and CGA for steadying/safety - self-selecting reciprocal pattern. Went into stairwell and ascended/descended 12 steps using 1 UE support on R HR then transitioning to L HR (to replicate home stairs) and reciprocal stepping pattern with CGA for safety/steadying but no LOB or knee instability noted. Gait training ~113ft to day room using rollator with CGA/supervision for safety. Stepped on/off treadmill using litegait bar for UE support with CGA for safety. Standing with UE support and supervision donned litegait harness.  Performed the following gait training trials on treadmill targeting increased endurance - ambulated for a total of 64minutes and 484ft - forward progressed to backwards ambulation at 1.18mph totaling 457ft - lateral side stepping ~27minute each direction at 0.26mph - pt continues to have significant weakness in hip abductors with short step lengths and not fully clearing feet when stepping Educated pt that lateral side stepping will be part of his HEP to continue progressing this. Doffed harness and stepped off treadmill as described above. Gait training ~155ft back to room, no AD, with CGA for safety but no LOB or unsteadiness noted. Pt left seated in recliner with needs in reach.  Therapy Documentation Precautions:  Precautions Precautions: Fall Restrictions Weight Bearing Restrictions: No Other Position/Activity Restrictions: WBAT in shoes with carbon plate inserts per wife  Pain: Reports muscle fatigue/soreness yesterday but no complaints of pain during session.    Therapy/Group: Individual Therapy  Tawana Scale , PT, DPT, CSRS  07/13/2019, 12:30 PM

## 2019-07-13 NOTE — Progress Notes (Signed)
Pt already wearing CPAP when RT checked during rounds. Pt states he is good at the moment. Advised pt to notify for RT if any further assistance needed.

## 2019-07-13 NOTE — Plan of Care (Signed)
  Problem: RH SAFETY Goal: RH STG ADHERE TO SAFETY PRECAUTIONS W/ASSISTANCE/DEVICE Description: STG Adhere to Safety Precautions With cues and reminders Outcome: Progressing Goal: RH STG DECREASED RISK OF FALL WITH ASSISTANCE Description: STG Decreased Risk of Fall With cues and reminders Outcome: Progressing

## 2019-07-13 NOTE — Progress Notes (Signed)
[facility] PHYSICAL MEDICINE & REHABILITATION PROGRESS NOTE  Subjective/Complaints: Feeling great this morning. Very happy with his progress here. Denies pain, constipation, insomnia. DC Friday   ROS:   Denies CP, SOB, N/V/D  Objective:   No results found. Recent Labs    07/13/19 0733  WBC 4.2  HGB 9.3*  HCT 29.3*  PLT 355   Recent Labs    07/13/19 0733  NA 135  K 4.0  CL 100  CO2 24  GLUCOSE 99  BUN 14  CREATININE 1.11  CALCIUM 9.3    Intake/Output Summary (Last 24 hours) at 07/13/2019 0915 Last data filed at 07/13/2019 0500 Gross per 24 hour  Intake --  Output 2775 ml  Net -2775 ml     Physical Exam: Vital Signs Blood pressure 121/64, pulse 90, temperature 98.6 F (37 C), resp. rate 18, weight 96.8 kg, SpO2 97 %. General: Alert and oriented x 3, No apparent distress HEENT: Head is normocephalic, atraumatic, PERRLA, EOMI, sclera anicteric, oral mucosa pink and moist, dentition intact, ext ear canals clear,  Neck: Supple without JVD or lymphadenopathy Heart: Reg rate and rhythm. No murmurs rubs or gallops Chest: CTA bilaterally without wheezes, rales, or rhonchi; no distress Abdomen: Soft, non-tender, non-distended, bowel sounds positive. Extremities: No clubbing, cyanosis, or edema. Pulses are 2+ Skin: Warm and dry.  Intact.  No rash noted in right arm. Psych: Normal mood.  Normal behavior. Musc: No edema in extremities.  No tenderness in extremities. Psych: Normal mood.  Normal behavior. Neuro: Alert Grossly 4/5 throughout, unchanged   Assessment/Plan: 1. Functional deficits secondary to debility Urosepsis which require 3+ hours per day of interdisciplinary therapy in a comprehensive inpatient rehab setting.  Physiatrist is providing close team supervision and 24 hour management of active medical problems listed below.  Physiatrist and rehab team continue to assess barriers to discharge/monitor patient progress toward functional and medical  goals  Care Tool:  Bathing    Body parts bathed by patient: Right arm, Left arm, Chest, Abdomen, Front perineal area, Buttocks, Right upper leg, Left upper leg, Left lower leg, Right lower leg, Face         Bathing assist Assist Level: Minimal Assistance - Patient > 75%     Upper Body Dressing/Undressing Upper body dressing   What is the patient wearing?: Pull over shirt    Upper body assist Assist Level: Independent    Lower Body Dressing/Undressing Lower body dressing      What is the patient wearing?: Pants     Lower body assist Assist for lower body dressing: Independent     Toileting Toileting    Toileting assist Assist for toileting: Independent with assistive device Assistive Device Comment: urinal   Transfers Chair/bed transfer  Transfers assist     Chair/bed transfer assist level: Supervision/Verbal cueing     Locomotion Ambulation   Ambulation assist      Assist level: Contact Guard/Touching assist (Simultaneous filing. User may not have seen previous data.) Assistive device: No Device (Simultaneous filing. User may not have seen previous data.) Max distance: 150 (Simultaneous filing. User may not have seen previous data.)   Walk 10 feet activity   Assist     Assist level: Contact Guard/Touching assist (Simultaneous filing. User may not have seen previous data.) Assistive device: No Device (Simultaneous filing. User may not have seen previous data.)   Walk 50 feet activity   Assist    Assist level: Contact Guard/Touching assist (Simultaneous filing. User may not have  seen previous data.) Assistive device: No Device (Simultaneous filing. User may not have seen previous data.)    Walk 150 feet activity   Assist    Assist level: Contact Guard/Touching assist (Simultaneous filing. User may not have seen previous data.) Assistive device: No Device (Simultaneous filing. User may not have seen previous data.)    Walk 10 feet on  uneven surface  activity   Assist Walk 10 feet on uneven surfaces activity did not occur: Safety/medical concerns   Assist level: Contact Guard/Touching assist Assistive device: Other (comment) (no device)   Wheelchair     Assist Will patient use wheelchair at discharge?: No Type of Wheelchair: Manual    Wheelchair assist level: Supervision/Verbal cueing Max wheelchair distance: 100    Wheelchair 50 feet with 2 turns activity    Assist        Assist Level: Supervision/Verbal cueing   Wheelchair 150 feet activity     Assist  Wheelchair 150 feet activity did not occur: Safety/medical concerns       Blood pressure 121/64, pulse 90, temperature 98.6 F (37 C), resp. rate 18, weight 96.8 kg, SpO2 97 %.  Medical Problem List and Plan: 1. Debility secondary to bacterial urosepsis   Continue CIR  2.  Antithrombotics: -DVT/anticoagulation:  Pharmaceutical: Heparin             -antiplatelet therapy: N/A 3. Pain Management:  Tramadol or Tylenol prn.   Controlled 7/12.  4. Mood:  LCSW to follow for evaluation and support.              -antipsychotic agents: N/A 5. Neuropsych: This patient capable of making decisions on his own behalf currently.  6. Skin/Wound Care: Routine pressure relief measures.  7. Fluids/Electrolytes/Nutrition: continue supplements--increased prostat to tid.   8. Enterococcal bacteremia: Continue Zyvox for 2 weeks with with end date 7/11.  9. Obstructive lung disease/underlying scoliosis: Pulmonary hygiene and encourage OOB activity.  10. Hypercarbic respiratory failure/OSA:  Nees to use CPAP whenever napping and at nights.  11. Macrocytic anemia/Vitamin B 12 deficiency: On  Vitamin B 12 supplement.   Hemoglobin 9.5 on 7/7, 9.3 on 7/12  Continue to monitor 12.  Acute renal failure: Due to infection has resolved.  13. Chronic heart failure with preserved EF: Added low salt restrictions. Will monitor for signs of overload with daily weights  and strict I/O. Continue metoprolol.  Filed Weights   07/10/19 0453 07/12/19 0519 07/13/19 0502  Weight: 97.6 kg 93.5 kg 96.8 kg   7/12: Fluctuates but overall stable from 7/9.  14. H/o "Excessive Alcohol use" /Hepatic steatosis:   LFTs within normal limits on 7/7   ETOH neuropathy likely as well 15. Hypokalemia: Resolved with supplementation.   Potassium 3.9 on 7/7, normal on 7/12.  16. Hypomagnesemia: Chronic due to malnutrition?    Oral supplement changed and increased on 7/10  Magnesium 1.6 on 7/7, continues to be low on 7/12. Increase mag gluconate to 500mg  TID and repeat level tomorrow.  17. Hypophosphatemia:   Phosphorus within normal limits on 7/7 18. Right 5th MT fx: WBAT with carbon enforced shoes plates.  19. Essential tremor: Mild exacerbated by anxiety on Xanax 0.5mg  daily prn. Would avoid benzos, may consider SSRI, pt had nausea with prior SSRI, can use prn benzo, used sparingly at home per pt and spouse 33. HTN: Continue amlodipine 5mg  daily.  Vitals:   07/12/19 2038 07/13/19 0502  BP: 116/72 121/64  Pulse: 78 90  Resp: 16 18  Temp: (!)  97.5 F (36.4 C) 98.6 F (37 C)  SpO2: 100% 97%   Controlled on 7/12  LOS: 6 days A FACE TO FACE EVALUATION WAS PERFORMED  Nayah Lukens P Agustine Rossitto 07/13/2019, 9:15 AM

## 2019-07-13 NOTE — Progress Notes (Signed)
Occupational Therapy Session Note  Patient Details  Name: Louis [name] MRN: [id number] Date of Birth: March 19, 1955  Today's Date: 07/13/2019 OT Individual Time:  -       Short Term Goals: Week 1:  OT Short Term Goal 1 (Week 1): STGs equal to LTGs set a supervision to modified independent level.  Skilled Therapeutic Interventions/Progress Updates:     Session 1:  (0900-1003) Pt pleasant and eager to work in therapy with agreement to work on ADL tasks.  He began with ambulation over to his dresser with use of the rollator for gathering his electric trimmer for shaving.  He then sat at the sink to complete this task with setup only.  Next, he ambulated to the bathroom, but initially attempted to complete this without use of the rollator, with therapist cueing him that he needed to take it.  Supervision was needed for standing while urinating.  He then completed shower sit to stand on the seat with supervision and use of the grab bar for support.  He was able to complete dressing sit to stand from the recliner, with mod instructional cueing to remember to lock his rollator brakes and to avoid excessive backing up with it as well.  He performed all dressing with supervision and then ambulated down to the ortho gym with use of the rollator for BUE strengthening.  He was able to complete seven mins on the UE ergonometer for BUE strengthening.  Resistance was set at level 11 with average RPMs of 25 per minute.  Completed session with ambulation back to the room via the rollator at supervision level.  He was left sitting up in the recliner with call button and phone in reach and safety belt in place.    Session 2: (1104-1200)  Pt in recliner to start session.  He was able to ambulate down to the dayroom with use of the rollator.  He worked on dynamic standing balance with use of the WII balance board.  He was able to complete several intervals of balance with min assist for stepping on and off of the balance  board with min assist for balance during short interval tasks.  He demonstrated dyspnea 2/4 during short intervals of 1-2 mins with rest breaks given in sitting PRN.  Finished session with ambulation back to the room with supervision and use of the rollator.  Call button and phone in reach with pt sitting in the recliner to finish session.   Therapy Documentation Precautions:  Precautions Precautions: Fall Restrictions Weight Bearing Restrictions: No Other Position/Activity Restrictions: WBAT in shoes with carbon plate inserts per wife   Pain: Pain Assessment Pain Scale: Faces Pain Score: 0-No pain ADL: See Care Tool Section for some details of mobility and selfcare  Therapy/Group: Individual Therapy  Teasha Murrillo OTR/L 07/13/2019, 9:37 AM

## 2019-07-14 ENCOUNTER — Encounter (HOSPITAL_COMMUNITY): Payer: 59 | Admitting: Occupational Therapy

## 2019-07-14 ENCOUNTER — Inpatient Hospital Stay (HOSPITAL_COMMUNITY): Payer: 59 | Admitting: Speech Pathology

## 2019-07-14 ENCOUNTER — Inpatient Hospital Stay (HOSPITAL_COMMUNITY): Payer: 59 | Admitting: Occupational Therapy

## 2019-07-14 ENCOUNTER — Ambulatory Visit (HOSPITAL_COMMUNITY): Payer: 59 | Admitting: Physical Therapy

## 2019-07-14 LAB — MAGNESIUM: Magnesium: 1.8 mg/dL (ref 1.7–2.4)

## 2019-07-14 NOTE — Progress Notes (Signed)
[facility] PHYSICAL MEDICINE & REHABILITATION PROGRESS NOTE  Subjective/Complaints: Feeling very well this morning.  He had SLP session with Louis [name] this morning and feel that he did very well. Has no complaints.   ROS: Denies CP, SOB, N/V/D  Objective:   No results found. Recent Labs    07/13/19 0733  WBC 4.2  HGB 9.3*  HCT 29.3*  PLT 355   Recent Labs    07/13/19 0733  NA 135  K 4.0  CL 100  CO2 24  GLUCOSE 99  BUN 14  CREATININE 1.11  CALCIUM 9.3    Intake/Output Summary (Last 24 hours) at 07/14/2019 0953 Last data filed at 07/14/2019 0107 Gross per 24 hour  Intake --  Output 1500 ml  Net -1500 ml     Physical Exam: Vital Signs Blood pressure 131/77, pulse 86, temperature 98 F (36.7 C), resp. rate 18, weight 99.6 kg, SpO2 97 %. General: Alert and oriented x 3, No apparent distress HEENT: Head is normocephalic, atraumatic, PERRLA, EOMI, sclera anicteric, oral mucosa pink and moist, dentition intact, ext ear canals clear,  Neck: Supple without JVD or lymphadenopathy Heart: Reg rate and rhythm. No murmurs rubs or gallops Chest: CTA bilaterally without wheezes, rales, or rhonchi; no distress Abdomen: Soft, non-tender, non-distended, bowel sounds positive. Extremities: No clubbing, cyanosis, or edema. Pulses are 2+ Skin: Warm and dry.  Intact.  No rash noted in right arm. Psych: Normal mood.  Normal behavior. Musc: No edema in extremities.  No tenderness in extremities. Psych: Normal mood.  Normal behavior. Neuro: Alert Grossly 4/5 throughout, unchanged  Assessment/Plan: 1. Functional deficits secondary to debility Urosepsis which require 3+ hours per day of interdisciplinary therapy in a comprehensive inpatient rehab setting.  Physiatrist is providing close team supervision and 24 hour management of active medical problems listed below.  Physiatrist and rehab team continue to assess barriers to discharge/monitor patient progress toward functional and  medical goals  Care Tool:  Bathing    Body parts bathed by patient: Right arm, Left arm, Chest, Abdomen, Front perineal area, Buttocks, Right upper leg, Left upper leg, Left lower leg, Right lower leg, Face         Bathing assist Assist Level: Supervision/Verbal cueing     Upper Body Dressing/Undressing Upper body dressing   What is the patient wearing?: Pull over shirt    Upper body assist Assist Level: Set up assist    Lower Body Dressing/Undressing Lower body dressing      What is the patient wearing?: Pants, Underwear/pull up     Lower body assist Assist for lower body dressing: Supervision/Verbal cueing     Toileting Toileting    Toileting assist Assist for toileting: Independent with assistive device Assistive Device Comment: urinal   Transfers Chair/bed transfer  Transfers assist     Chair/bed transfer assist level: Supervision/Verbal cueing     Locomotion Ambulation   Ambulation assist      Assist level: Contact Guard/Touching assist Assistive device: No Device Max distance: 115ft   Walk 10 feet activity   Assist     Assist level: Contact Guard/Touching assist Assistive device: No Device   Walk 50 feet activity   Assist    Assist level: Contact Guard/Touching assist Assistive device: No Device    Walk 150 feet activity   Assist    Assist level: Contact Guard/Touching assist Assistive device: No Device    Walk 10 feet on uneven surface  activity   Assist Walk 10 feet on uneven  surfaces activity did not occur: Safety/medical concerns   Assist level: Contact Guard/Touching assist Assistive device: Other (comment) (no device)   Wheelchair     Assist Will patient use wheelchair at discharge?: No Type of Wheelchair: Manual    Wheelchair assist level: Supervision/Verbal cueing Max wheelchair distance: 100    Wheelchair 50 feet with 2 turns activity    Assist        Assist Level: Supervision/Verbal  cueing   Wheelchair 150 feet activity     Assist  Wheelchair 150 feet activity did not occur: Safety/medical concerns       Blood pressure 131/77, pulse 86, temperature 98 F (36.7 C), resp. rate 18, weight 99.6 kg, SpO2 97 %.  Medical Problem List and Plan: 1. Debility secondary to bacterial urosepsis   Continue CIR 2.  Antithrombotics: -DVT/anticoagulation:  Pharmaceutical: Heparin- discontinued 7/13 as ambulating >200 feet.              -antiplatelet therapy: N/A 3. Pain Management:  Tramadol or Tylenol prn.   Controlled 7/13 4. Mood:  LCSW to follow for evaluation and support.              -antipsychotic agents: N/A 5. Neuropsych: This patient capable of making decisions on his own behalf currently.  6. Skin/Wound Care: Routine pressure relief measures.  7. Fluids/Electrolytes/Nutrition: continue supplements--increased prostat to tid.   8. Enterococcal bacteremia: Continue Zyvox for 2 weeks with with end date 7/11.  9. Obstructive lung disease/underlying scoliosis: Pulmonary hygiene and encourage OOB activity.  10. Hypercarbic respiratory failure/OSA:  Nees to use CPAP whenever napping and at nights.  11. Macrocytic anemia/Vitamin B 12 deficiency: On  Vitamin B 12 supplement.   Hemoglobin 9.5 on 7/7, 9.3 on 7/12  Continue to monitor 12.  Acute renal failure: Due to infection has resolved.  13. Chronic heart failure with preserved EF: Added low salt restrictions. Will monitor for signs of overload with daily weights and strict I/O. Continue metoprolol.  Filed Weights   07/12/19 0519 07/13/19 0502 07/14/19 0522  Weight: 93.5 kg 96.8 kg 99.6 kg   7/13: large increase- does appear to have increased lower extremity edema. Advised compression garments, elevation, and ice. (ordered). Can consider addition of Lasix if swelling persists/weight uptrend further.  14. H/o "Excessive Alcohol use" /Hepatic steatosis:   LFTs within normal limits on 7/7   ETOH neuropathy likely as  well 15. Hypokalemia: Resolved with supplementation.   Potassium 3.9 on 7/7, normal on 7/12.  16. Hypomagnesemia: Chronic due to malnutrition?    Oral supplement changed and increased on 7/10  Magnesium 1.6 on 7/7, continues to be low on 7/12. Increase mag gluconate to 500mg  TID   7/13: Magnesium improved to 1.8. Continue TID supplement and recheck level as outpatient.   17. Hypophosphatemia:   Phosphorus within normal limits on 7/7 18. Right 5th MT fx: WBAT with carbon enforced shoes plates.  19. Essential tremor: Mild exacerbated by anxiety on Xanax 0.5mg  daily prn. Would avoid benzos, may consider SSRI, pt had nausea with prior SSRI, can use prn benzo, used sparingly at home per pt and spouse 58. HTN: Continue amlodipine 5mg  daily.  Vitals:   07/13/19 1952 07/14/19 0522  BP: 120/66 131/77  Pulse: 79 86  Resp: 18 18  Temp: 98 F (36.7 C) 98 F (36.7 C)  SpO2: 99% 97%   Controlled on 7/12  LOS: 7 days A FACE TO FACE EVALUATION WAS PERFORMED  Louis [name] P Trana Ressler 07/14/2019, 9:53 AM

## 2019-07-14 NOTE — Progress Notes (Signed)
Occupational Therapy Session Note  Patient Details  Name: Louis [name] MRN: [id number] Date of Birth: [birth year]  Today's Date: 07/14/2019 OT Individual Time: 1004-1101 OT Individual Time Calculation (min): 57 min    Short Term Goals: Week 1:  OT Short Term Goal 1 (Week 1): STGs equal to LTGs set a supervision to modified independent level.  Skilled Therapeutic Interventions/Progress Updates:    Session 1: (6440-3474) Pt's spouse in for family education this session.  Discussed pt's current level of assist for selfcare tasks and ADLs at this time, which is supervision.  Discussed yesterday's difficulty with recalling to lock the rollator brakes with transfers and before sitting down.  He was able to ambulate down to the ADL apartment at supervision using the rollator for practice and demonstration with simulated walk-in shower transfers.  He completed with supervision level stepping backwards into the shower and then stepping forward out with use of the rollator for support.  He was able to remember to lock the wheelchair brakes with all transfers this session, but did sit down to close to the arm of the couch as he did not align himself with the cushion correctly.  Returned to room with supervision as well with pt resting in the recliner for next session.  Discussed removal of throw rugs in the bathroom as well as possible need for RW and not rollator when using the master bath as the rollator will not fit in the toilet closet efficiently.  Both pt and spouse voice understanding as well as need for pt to sit down with toileting tasks and not stand to urinate.    Session 2: (2595-6387)  Pt in chair eager for session.  Had him ambulate with the rollator to the dayroom where he engaged in balance task with use of the Wii.  He was able to stand unsupported for intervals of 2-3 mins while engaged in task with close supervision for balance without any UE support.  He was able to sit on his rollator for  rest breaks and demonstrated consistency with locking the brakes prior to sitting and re-checking them as needed.  Several intervals were completed in standing with noted fatigue in the legs and dyspnea at 2/4 prior to resting.  Finished session with ambulation back to his room at supervision level with use of the rollator.  Call button and phone in reach at end of session with pt sitting up in the recliner.    Therapy Documentation Precautions:  Precautions Precautions: Fall Precaution Comments: watch O2 sats Restrictions Weight Bearing Restrictions: No Other Position/Activity Restrictions: WBAT in shoes with carbon plate inserts per wife   Pain: Pain Assessment Pain Scale: Faces Pain Score: 0-No pain Faces Pain Scale: No hurt ADL: See Care Tool Section for some details of mobility and selfcare  Therapy/Group: Individual Therapy  Ebonique Hallstrom OTR/L 07/14/2019, 12:52 PM

## 2019-07-14 NOTE — Plan of Care (Signed)
  Problem: RH Wheelchair Mobility Goal: LTG Patient will propel w/c in controlled environment (PT) Description: LTG: Patient will propel wheelchair in controlled environment, # of feet with assist (PT) Outcome: Not Applicable Flowsheets (Taken 07/14/2019 2112) LTG: Pt will propel w/c in controlled environ  assist needed:: (Discontinued as pt is a functional ambulator)  Note: Discontinued as pt is a functional ambulator Goal: LTG Patient will propel w/c in home environment (PT) Description: LTG: Patient will propel wheelchair in home environment, # of feet with assistance (PT). Outcome: Not Applicable Flowsheets (Taken 07/14/2019 2112) LTG: Pt will propel w/c in home environ  assist needed:: (Discontinued as pt is a functional ambulator)  Note: Discontinued as pt is a functional ambulator

## 2019-07-14 NOTE — Progress Notes (Signed)
Physical Therapy Session Note  Patient Details  Name: Louis [name] MRN: [id number] Date of Birth: February 19, 1955  Today's Date: 07/14/2019 PT Individual Time: 1122-1208 PT Individual Time Calculation (min): 46 min   Short Term Goals: Week 1:  PT Short Term Goal 1 (Week 1): STG = LTGs due to LOS  Skilled Therapeutic Interventions/Progress Updates:    Pt received sitting in recliner with his wife, Lyn, present for family education/training and pt agreeable to therapy session. Pt/family inquiring about use of rollator in the home vs community: therapist educated on definitely needing the rollator in the community for longer distances and unlevel surfaces; however, once pt has reacclimated to his home environment recommended ambulating without AD with close supervision from his wife inside the home - pt/family in agreement as pt's primary goal was to not need an AD for ambulation. Educated pt on recommendation for continued OPPT upon discharge because based on his continued hip abduction weakness when a LOB does occur he may not have the power (speed and strength) to perform adequate stepping strategy for balance recovery - pt verbalizes understanding. Therapist educated pt/family on fall risk education and calling 911. Educated wife on pt learning floor transfer technique to allow pt to perform stretches on floor mat of their home gym. Sit<>stands without AD with distant supervision during session. Gait training ~146ft, no AD, to stairwell with close supervision - no LOB or significant unsteadiness noted - does have slightly wider BOS with slight increased hip external rotation (anticipate this may also be due to the carbon fiber plates in his shoes limiting foot/ankle anterior progression during normal stance phase) - pt's wife reports his gait is significantly improved compared to immediately prior to hospitalization. Educated pt's wife on how to safely guard pt on stairs. Ascended/descended 12 steps using  HRs to replicate home environment with close supervision - pt self selecting reciprocal pattern - repeated a 2nd time with pt's wife demonstrating safe supervision assist with pt then self-selecting step-to pattern demonstrating proper safety awareness in event his LEs are fatiguing and to have increased control when his wife is providing assist. Gait training ~185ft to ortho gym, no AD, with close supervision - again no LOB or significant unsteadiness - continues with above gait impairments. Simulated ambulatory car transfer (small SUV), no AD, with supervision - demos good safety awareness via turning to sit prior to placing LEs in car without cuing. Discussed safe home set-up with night lights, rugs, and ensuring no obstacles in his path when ambulating. Gait training ~318ft back to room using rollator with distant supervision - no LOB or unsteadiness. Min cuing when in room to walk up to recliner then turn around to sit when using rollator as opposed to walking ~86ft backwards to decrease fall risk. Pt/family report no questions/concerns regarding education today and demonstrated excellent understanding. Pt left seated in recliner with needs in reach and his wife present.  Therapy Documentation Precautions:  Precautions Precautions: Fall Restrictions Weight Bearing Restrictions: No Other Position/Activity Restrictions: WBAT in shoes with carbon plate inserts per wife  Pain: No reports of pain throughout session.   Therapy/Group: Individual Therapy  Tawana Scale , PT, DPT, CSRS  07/14/2019, 8:01 AM

## 2019-07-14 NOTE — Evaluation (Signed)
Speech Language Pathology Assessment and Plan  Patient Details  Name: Louis [name] MRN: [id number] Date of Birth: Apr 23, 1955  SLP Diagnosis:  (n/a for ST)  Rehab Potential: Excellent ELOS: n/a for ST    Today's Date: 07/14/2019 SLP Individual Time: 0730-0820 SLP Individual Time Calculation (min): 50 min   Hospital Problem: Principal Problem:   Debility Active Problems:   Hypomagnesemia   Macrocytic anemia   Bacteremia  Past Medical History:  Past Medical History:  Diagnosis Date  . Anxiety    situational  . BPH (benign prostatic hyperplasia)    on flomax  . Bundle branch block    per prior PCP records  . CHF (congestive heart failure) ([place])   . COPD (chronic obstructive pulmonary disease) ([place])   . Diverticulosis    by colonoscopy  . Eczema    per prior PCP records  . Elevated PSA 2015   peaked 6s, s/p benign biopsy 2014, sees urology [name] ([place])  . Essential tremor   . GERD (gastroesophageal reflux disease)    per prior PCP records  . History of panic attacks    per prior PCP records  . HTN (hypertension)   . Mild intermittent asthma in adult without complication   . Obesity, Class I, BMI 30-34.9   . Osteoporosis    DEXA 06/2013 with osteopenia - h/o ?wrist/hip fracture from AVN from chronic steroid use (asthma), took reclast for 1 year  . Renal artery stenosis in 1 of 2 vessels ([place]) 2011   by [place]  . Seasonal allergies   . Sleep apnea   . Thoracic scoliosis childhood  . Transaminitis    per prior PCP records   Past Surgical History:  Past Surgical History:  Procedure Laterality Date  . APPLICATION OF WOUND VAC Left 05/09/2018   Procedure: Application Of Wound Vac;  Surgeon: Newt Minion, MD;  Location: [place];  Service: Orthopedics;  Laterality: Left;  . BIOPSY  04/30/2019   Procedure: BIOPSY;  Surgeon: Juanita Craver, MD;  Location: [facility];  Service: Endoscopy;;  . COLONOSCOPY  12/2013   polyps, int hem, diverticulosis, rpt 5 yrs ([place])  .  COLONOSCOPY WITH PROPOFOL N/A 04/30/2019   Procedure: COLONOSCOPY WITH PROPOFOL;  Surgeon: Juanita Craver, MD;  Location: [facility];  Service: Endoscopy;  Laterality: N/A;  . ELBOW SURGERY Right 2008   golfer's elbow  . ESOPHAGOGASTRODUODENOSCOPY (EGD) WITH PROPOFOL N/A 04/30/2019   Procedure: ESOPHAGOGASTRODUODENOSCOPY (EGD) WITH PROPOFOL;  Surgeon: Juanita Craver, MD;  Location: [facility];  Service: Endoscopy;  Laterality: N/A;  . INGUINAL HERNIA REPAIR Bilateral childhood & ~1995  . LAPAROTOMY N/A 01/05/2019   Procedure: EXPLORATORY LAPAROTOMY graham patch repair;  Surgeon: Benjamine Sprague, DO;  Location: [facility];  Service: General;  Laterality: N/A;  . NASAL SEPTUM SURGERY  2013   deviated septum  . ORIF ANKLE FRACTURE Left 05/09/2018   Procedure: OPEN REDUCTION INTERNAL FIXATION LEFT LISFRANC FRACTURE/DISLOCATION;  Surgeon: Newt Minion, MD;  Location: [place];  Service: Orthopedics;  Laterality: Left;  . POLYPECTOMY  04/30/2019   Procedure: POLYPECTOMY;  Surgeon: Juanita Craver, MD;  Location: [facility];  Service: Endoscopy;;  . TEE WITHOUT CARDIOVERSION N/A 06/29/2019   Procedure: TRANSESOPHAGEAL ECHOCARDIOGRAM (TEE);  Surgeon: Acie Fredrickson Wonda Cheng, MD;  [facility];  Service: Cardiovascular;  Laterality: N/A;  . US ECHOCARDIOGRAPHY  02/2009   WNL, EF >55% [facility])  . US RENAL/AORTA Right 05/2009   1-59% diameter reduction renal artery, rec rpt 2 yrs    Assessment /  Plan / Recommendation Clinical Impression   HPI: Louis [name] is a 64 year old male with history of familial tremors, restrictive lung disease with scoliosis, perforated duodenal ulcer 01/2019, hepatic steatosis, moderate to excessive alcohol use, left foot lisfranc fracture with dislocation s/p ORIF 05/2018 with onset of neuropathy, B12 deficiency, gait disorder with history of falls who was admitted to [facility] on 06/25/19 with 2 week history of difficulty walking, confusion, SOB and multiple falls. He was found to have acute  renal failure with hyperkalemia, hyponatremia and was febrile at admission.  Neurology and ID consulted for full work up which revealed sepsis due to enterococcus bacteremia.  Urine culture positive for enterococcus but patient denied symptoms.  CT head negative for acute changes. CTA chest negative for PE but showed severe hepatic steatosis and  1cm left upper pole renal lesion with recommendations for nonemergent abdominal MRI for work up. TEE was negative for vegetation and showed normal LVF with no valvular abnormality. He was started on ampicillin but continued to have fevers therefore antibiotics changed to daptomycin on 06/27 by Dr. Baxter Flattery.  MRI abdomen done and was negative for abdominal abscess and 1 cm benign hemorrhagic cyst noted in upper pole of left upper kidney.   He has had issues with delirium and waxing and waning of mental status with plans for CIR initially on 06/30 but developed acute hypoxemic hypercarbic respiratory failure with respiratory arrest due to acute pulmonary edema and required intubation. He was treated with IV diuresis and bronchoscopy done revealing severe bronchomalacia right lung particularly RLL with severe narrowing of RLL and RML orifices and mild LLL bronchomalacia. BAL showed inflammatory infiltrate with neutrophil predominance and antibiotic coverage broadened to include HCAP.  . Due to concerns for daptomycin toxicity,  Dr. Baxter Flattery recommended change to antibiotics to Linzeloid  X 14 day with 06/28 as day #1.  BAL.  He tolerated extubation by 07/01 and has continued to have bouts of confusion and mild productive cough. BAL showed normal flora and Cefepime d/c on 07/03.  He has had issues with electrolyte abnormalities that are being supplemented. Macrocytic anemia of being monitored with H/H in 7.7- 8.7 range. He did have follow up X rays of right foot done 07/05 and c/w 06/15 reveal increased lucency of fracture without definite callus and no change in alinement.  Pt  was admitted to [facility] CIR program 07/07/19; request for ST consult was received on 07/13/19 due to PT's concern for reduced short and long term recall, therefore ST administered cognitive-linguistic evaluation 07/14/19 with results as follows:  Pt presents with mild short term memory deficits noted during delayed recall task, requiring categorical or multiple choice cues for retrieval of 4/5 words. Pt also reported baseline short term memory difficulties, having to use strategies to compensate and requiring repetition to recall some conversations with family. Although no family present to confirm, pt reports he does not feel as though these deficits are exacerbated in setting of current admission. Pt scored 24/30 on MOCA version 7.1 (26 or > WFL), with primary deficit noted in the short term memory subtest. Pt was aware of all errors and able to identify 2 compensatory memory strategies Mod I. Verbal education and written handout provided regarding additional compensatory memory strategies, also discussed in the context of his job. Pt able to recall course of hospitalization, new strategies for safe mobility learned in CIR, anticipate equipment needs, and potential impact of current deficits on daily function and leisure activities. SLP and pt discussed  awareness and how to identify need to seek medical or OP ST services if a further decline in memory is noted.   No skilled ST is indicated at this time.    Skilled Therapeutic Interventions          Cognitive-linguistic evaluaiton was administered and results were reviewed with pt (please see above for details regarding results).  Education also provided regarding compensatory memory strategies, awareness, and OP ST services/indication of need for services.    SLP Assessment  Patient does not need any further Speech [facility] Pathology Services    Recommendations  Patient destination: Home Follow up Recommendations: None Equipment Recommended: None  recommended by SLP         SLP Duration   n/a for ST          Pain Pain Assessment Pain Scale: Faces Pain Score: 0-No pain Faces Pain Scale: No hurt      SLP Evaluation Cognition Overall Cognitive Status: No family/caregiver present to determine baseline cognitive functioning Arousal/Alertness: Awake/alert Orientation Level: Oriented X4 Attention: Selective Selective Attention: Appears intact Memory: Impaired Memory Impairment: Retrieval deficit;Decreased short term memory Decreased Short Term Memory: Verbal basic Awareness: Appears intact Problem Solving: Appears intact Executive Function: Organizing;Self Monitoring;Self Correcting Organizing: Appears intact Self Monitoring: Appears intact Self Correcting: Appears intact Safety/Judgment: Appears intact  Comprehension Auditory Comprehension Overall Auditory Comprehension: Appears within functional limits for tasks assessed Conversation: Complex Visual Recognition/Discrimination Discrimination: Within Function Limits Reading Comprehension Reading Status: Not tested Expression Expression Primary Mode of Expression: Verbal Verbal Expression Overall Verbal Expression: Appears within functional limits for tasks assessed Non-Verbal Means of Communication: Not applicable Written Expression Written Expression: Within Functional Limits Oral Motor Oral Motor/Sensory Function Overall Oral Motor/Sensory Function: Within functional limits Motor Speech Overall Motor Speech: Appears within functional limits for tasks assessed Phonation: Normal Intelligibility: Intelligible   PMSV Assessment  PMSV Trial Intelligibility: Intelligible      Short Term Goals: No short term goals set    Recommendations for other services: None   Discharge Criteria: Patient will be discharged from SLP if patient refuses treatment 3 consecutive times without medical reason, if treatment goals not met, if there is a change in  medical status, if patient makes no progress towards goals or if patient is discharged from hospital.  The above assessment, treatment plan, treatment alternatives and goals were discussed and mutually agreed upon: by patient  Louis [name] 07/14/2019, 12:07 PM

## 2019-07-14 NOTE — Plan of Care (Signed)
  Problem: RH SAFETY Goal: RH STG ADHERE TO SAFETY PRECAUTIONS W/ASSISTANCE/DEVICE Description: STG Adhere to Safety Precautions With cues and reminders Outcome: Progressing Goal: RH STG DECREASED RISK OF FALL WITH ASSISTANCE Description: STG Decreased Risk of Fall With cues and reminders Outcome: Progressing

## 2019-07-15 ENCOUNTER — Inpatient Hospital Stay (HOSPITAL_COMMUNITY): Payer: 59 | Admitting: Occupational Therapy

## 2019-07-15 ENCOUNTER — Inpatient Hospital Stay (HOSPITAL_COMMUNITY): Payer: 59 | Admitting: Physical Therapy

## 2019-07-15 NOTE — Progress Notes (Signed)
[facility] PHYSICAL MEDICINE & REHABILITATION PROGRESS NOTE  Subjective/Complaints:  Still with balance issues, ADLs improving, discussed weight and method of measurement  Intake not recorded output normal 1500-2042m per day  ROS: Denies CP, SOB, N/V/D  Objective:   No results found. Recent Labs    07/13/19 0733  WBC 4.2  HGB 9.3*  HCT 29.3*  PLT 355   Recent Labs    07/13/19 0733  NA 135  K 4.0  CL 100  CO2 24  GLUCOSE 99  BUN 14  CREATININE 1.11  CALCIUM 9.3    Intake/Output Summary (Last 24 hours) at 07/15/2019 0809 Last data filed at 07/15/2019 0645 Gross per 24 hour  Intake --  Output 2025 ml  Net -2025 ml     Physical Exam: Vital Signs Blood pressure 99/70, pulse 84, temperature 98.3 F (36.8 C), temperature source Oral, resp. rate 18, weight 92.6 kg, SpO2 95 %.  General: No acute distress Mood and affect are appropriate Heart: Regular rate and rhythm no rubs murmurs or extra sounds Lungs: Clear to auscultation, breathing unlabored, no rales or wheezes Abdomen: Positive bowel sounds, soft nontender to palpation, nondistended Extremities: No clubbing, cyanosis, or edema Skin: No evidence of breakdown, no evidence of rash Neurologic: Cranial nerves II through XII intact, motor strength is 5/5 in bilateral deltoid, bicep, tricep, grip, hip flexor, knee extensors, ankle dorsiflexor and plantar flexor Sensory exam normal sensation to light touch and proprioception in bilateral upper and lower extremities Cerebellar exam normal finger to nose to finger as well as heel to shin in bilateral upper and lower extremities Musculoskeletal: Full range of motion in all 4 extremities. No joint swelling  Assessment/Plan: 1. Functional deficits secondary to debility Urosepsis which require 3+ hours per day of interdisciplinary therapy in a comprehensive inpatient rehab setting.  Physiatrist is providing close team supervision and 24 hour management of active medical  problems listed below.  Physiatrist and rehab team continue to assess barriers to discharge/monitor patient progress toward functional and medical goals  Care Tool:  Bathing    Body parts bathed by patient: Right arm, Left arm, Chest, Abdomen, Front perineal area, Buttocks, Right upper leg, Left upper leg, Left lower leg, Right lower leg, Face         Bathing assist Assist Level: Supervision/Verbal cueing     Upper Body Dressing/Undressing Upper body dressing   What is the patient wearing?: Pull over shirt    Upper body assist Assist Level: Set up assist    Lower Body Dressing/Undressing Lower body dressing      What is the patient wearing?: Pants, Underwear/pull up     Lower body assist Assist for lower body dressing: Supervision/Verbal cueing     Toileting Toileting    Toileting assist Assist for toileting: Independent with assistive device Assistive Device Comment: urinal   Transfers Chair/bed transfer  Transfers assist     Chair/bed transfer assist level: Supervision/Verbal cueing     Locomotion Ambulation   Ambulation assist      Assist level: Supervision/Verbal cueing Assistive device: No Device Max distance: 1730f  Walk 10 feet activity   Assist     Assist level: Supervision/Verbal cueing Assistive device: No Device   Walk 50 feet activity   Assist    Assist level: Supervision/Verbal cueing Assistive device: No Device    Walk 150 feet activity   Assist    Assist level: Supervision/Verbal cueing Assistive device: No Device    Walk 10 feet on uneven  surface  activity   Assist Walk 10 feet on uneven surfaces activity did not occur: Safety/medical concerns   Assist level: Contact Guard/Touching assist Assistive device: Other (comment) (no device)   Wheelchair     Assist Will patient use wheelchair at discharge?: No Type of Wheelchair: Manual    Wheelchair assist level: Supervision/Verbal cueing Max  wheelchair distance: 100    Wheelchair 50 feet with 2 turns activity    Assist        Assist Level: Supervision/Verbal cueing   Wheelchair 150 feet activity     Assist  Wheelchair 150 feet activity did not occur: Safety/medical concerns       Blood pressure 99/70, pulse 84, temperature 98.3 F (36.8 C), temperature source Oral, resp. rate 18, weight 92.6 kg, SpO2 95 %.  Medical Problem List and Plan: 1. Debility secondary to bacterial urosepsis   Continue CIR Team conference today please see physician documentation under team conference tab, met with team  to discuss problems,progress, and goals. Formulized individual treatment plan based on medical history, underlying problem and comorbidities. No need for OP OT post d/c 2.  Antithrombotics: -DVT/anticoagulation:  Pharmaceutical: Heparin- discontinued 7/13 as ambulating >200 feet.              -antiplatelet therapy: N/A 3. Pain Management:  Tramadol or Tylenol prn.   Controlled 7/13 4. Mood:  LCSW to follow for evaluation and support.              -antipsychotic agents: N/A 5. Neuropsych: This patient capable of making decisions on his own behalf currently.  6. Skin/Wound Care: Routine pressure relief measures.  7. Fluids/Electrolytes/Nutrition: continue supplements--increased prostat to tid.   8. Enterococcal bacteremia: Continue Zyvox for 2 weeks with with end date 7/11.  9. Obstructive lung disease/underlying scoliosis: Pulmonary hygiene and encourage OOB activity.  10. Hypercarbic respiratory failure/OSA:  Nees to use CPAP whenever napping and at nights.  11. Macrocytic anemia/Vitamin B 12 deficiency: On  Vitamin B 12 supplement.   Hemoglobin 9.5 on 7/7, 9.3 on 7/12  Continue to monitor 12.  Acute renal failure: Due to infection has resolved.  13. Chronic heart failure with preserved EF: Added low salt restrictions. Will monitor for signs of overload with daily weights and strict I/O. Continue metoprolol.   Filed Weights   07/13/19 0502 07/14/19 0522 07/15/19 0308  Weight: 96.8 kg 99.6 kg 92.6 kg   ?technique given wide fluctuations despite no change in diuretics, try standing weights  14. H/o "Excessive Alcohol use" /Hepatic steatosis:   LFTs within normal limits on 7/7   ETOH neuropathy likely as well 15. Hypokalemia: Resolved with supplementation.   Potassium 3.9 on 7/7, normal on 7/12.  16. Hypomagnesemia: Chronic due to malnutrition?    Oral supplement changed and increased on 7/10  Magnesium 1.6 on 7/7, continues to be low on 7/12. Increase mag gluconate to 59m TID   7/13: Magnesium improved to 1.8. Continue TID supplement and recheck level as outpatient.   17. Hypophosphatemia:   Phosphorus within normal limits on 7/7 18. Right 5th MT fx: WBAT with carbon enforced shoes plates.  19. Essential tremor: Mild exacerbated by anxiety on Xanax 0.556mdaily prn. Would avoid benzos, may consider SSRI, pt had nausea with prior SSRI, can use prn benzo, used sparingly at home per pt and spouse 2046HTN: Continue amlodipine 98m23maily.  Vitals:   07/14/19 1934 07/15/19 0308  BP: 121/68 99/70  Pulse: 77 84  Resp: 18 18  Temp:  98.3 F (36.8 C) 98.3 F (36.8 C)  SpO2: 97% 95%   Controlled on 7/14  LOS: 8 days A FACE TO FACE EVALUATION WAS PERFORMED  Charlett Blake 07/15/2019, 8:09 AM

## 2019-07-15 NOTE — Patient Care Conference (Signed)
Inpatient RehabilitationTeam Conference and Plan of Care Update Date: 07/15/2019   Time: 1:58 PM    Patient Name: Louis [name]      Medical Record Number: [id number]  Date of Birth: 1955-07-13 Sex: Male         Room/Bed: 3G18E/9H37J-69 Payor Info: Payor: [employer] / Plan: Electrical engineer / Product Type: *No Product type* /    Admit Date/Time:  07/07/2019  3:27 PM  Primary Diagnosis:  [facility] Problems: Principal Problem:   Debility Active Problems:   Hypomagnesemia   Macrocytic anemia   Bacteremia    Expected Discharge Date: Expected Discharge Date: 07/17/19  Team Members Present: Physician leading conference: Dr. Alysia Penna Care Coodinator Present: Dorien Chihuahua, RN, BSN, CRRN;Christina Sampson Goon, [place] Nurse Present: Debroah Loop, RN PT Present: Page Spiro, PT OT Present: Clyda Greener, OT PPS Coordinator present : Gunnar Fusi, SLP     Current Status/Progress Goal Weekly Team Focus  Bowel/Bladder   patient continent of bowel and bladder  Maintain Continence  provide assistance to toileting needs   Swallow/Nutrition/ Hydration             ADL's   Supervision for all bathing, dressing, transfers with use of the rollator for support.  supervision to modified independent level  selfcare retraining, balance retraining, transfer training, DME education, pt/family education   Mobility   supervision bed mobility, supervision functional transfers with and without rollator, ambulation up to 323ft using rollator, 12 stair navigation using HRs per home set-up with supervision  mod-I overall with supervision ambulation/stairs  B LE strengthening, dynamic standing balance, gait training with LRAD, pt/family education, discharge planning, cardiovascular endurance   Communication             Safety/Cognition/ Behavioral Observations            Pain   patient denies pain  Remain pain free  Assess pain q shift and PRN   Skin   +1 Edema in BLE, eechymosis in hip,  abdomen and knee  Maintain good skin integrity.  Q shift skin assessment     Team Discussion:  Discharge Planning/Teaching Needs:  Patient plans to discharge home independent  Will schedule with spouse if reccomended   Current Update: on target  Current Barriers to Discharge:  Hip abductor weakness limits ability to catch self and correct possible fall  Possible Resolutions to Barriers: Home exercise program specific for strenghtening  Patient on target to meet rehab goals: yes  *See Care Plan and progress notes for long and short-term goals.   Revisions to Treatment Plan:      Medical Summary Current Status: cont bowel adn bladder , BPs well controlled Weekly Focus/Goal: DC planning, cont balance training  Barriers to Discharge: Medical stability   Possible Resolutions to Barriers: trial Mod I in room prior to d/c, work on hip abd weakness   Continued Need for Acute Rehabilitation Level of Care: The patient requires daily medical management by a physician with specialized training in physical medicine and rehabilitation for the following reasons: Direction of a multidisciplinary physical rehabilitation program to maximize functional independence : Yes Medical management of patient stability for increased activity during participation in an intensive rehabilitation regime.: Yes Analysis of laboratory values and/or radiology reports with any subsequent need for medication adjustment and/or medical intervention. : Yes   I attest that I was present, lead the team conference, and concur with the assessment and plan of the team.   [name] 07/15/2019, 1:58 PM

## 2019-07-15 NOTE — Progress Notes (Signed)
cpap set up and ready for PT, he states he will put it on when hes ready.

## 2019-07-15 NOTE — Plan of Care (Signed)
  Problem: RH KNOWLEDGE DEFICIT GENERAL Goal: RH STG INCREASE KNOWLEDGE OF SELF CARE AFTER HOSPITALIZATION Description: Pt will be able to demonstrate understanding of precautions to take to prevent falls and injuries with mod I assist. Pt will be able to demonstrate understanding of medication management, dietary and lifestyle modification to better control blood pressure and prevent further complication independently.  Outcome: Progressing

## 2019-07-15 NOTE — Progress Notes (Signed)
Patient rested all night w/o any c/o pain or any discomfort. Tremors observed in R hand and +1 pitting edema in BLE.

## 2019-07-15 NOTE — Progress Notes (Signed)
[name], male   DOB: [birth year], 64 y.o.   MRN: [id number] Team Conference Report to Patient/Family  Team Conference discussion was reviewed with the patient and caregiver, including goals, any changes in plan of care and target discharge date.  Patient and caregiver express understanding and are in agreement.  The patient has a target discharge date of 07/17/19.  [name] 07/15/2019, 2:00 PM

## 2019-07-15 NOTE — Progress Notes (Signed)
Patient stated he will place himself on CPAP when he is ready. CPAP is set up at bedside with 2L O2 bled into circuit. RT will monitor as needed.

## 2019-07-15 NOTE — Progress Notes (Addendum)
Occupational Therapy Session Note  Patient Details  Name: Louis [name] MRN: [id number] Date of Birth: Jan 02, 1955  Today's Date: 07/15/2019 OT Individual Time: 3154-0086 OT Individual Time Calculation (min): 45 min    Short Term Goals: Week 1:  OT Short Term Goal 1 (Week 1): STGs equal to LTGs set a supervision to modified independent level.  Skilled Therapeutic Interventions/Progress Updates:    Session 1:   [phone]) Pt requesting ADL session this am.  He was able to ambulate to the bathroom with use of the rollator with supervision and complete toileting as well as bathing tasks sit to stand from the shower.  He then ambulated out to the recliner at the bedside for dressing at supervision level.  He was able to use the rollator for ambulating to the sink to complete grooming tasks as well as for retrieving items from his dresser and cleaning up dirty clothing from the floor.  He exhibited one episode of forgetting to lock the rollator brakes but overall did well at remembering this.  Finished session with pt in the recliner to rest and call button and phone in reach.  Session 2: ([phone])  Pt used the rollator for functional mobility down to the [name] and outside with supervision.  Once outside he worked on transfers to the regular benches as well as functional mobility with use of an assistive device up and down inclines as well as on an off of soft surfaces at supervision level.  He was able to retrieve items from the ground as well with supervision for reaching.  Oxygen sats at 93% or greater with HR increasing to the low 100's with activity.  Dyspnea 2/4 as well.  With ambulation back to the room, the rollator was used and he needed one rest break to complete task secondary to fatigue. He was left in the recliner at the end of the session with call button and phone in reach.    Therapy Documentation Precautions:  Precautions Precautions: Fall Precaution Comments: watch  O2 sats Restrictions Weight Bearing Restrictions: No Other Position/Activity Restrictions: WBAT in shoes with carbon plate inserts per wife   Pain: Pain Assessment Pain Scale: Faces Pain Score: 0-No pain ADL: See Care Tool Section for some details of mobility and selfcare  Therapy/Group: Individual Therapy  Lafreda Casebeer OTR/L 07/15/2019, 10:18 AM

## 2019-07-15 NOTE — Progress Notes (Signed)
Physical Therapy Session Note  Patient Details  Name: Louis [name] MRN: [id number] Date of Birth: 1955/06/12  Today's Date: 07/15/2019 PT Individual Time: 1003-1059 PT Individual Time Calculation (min): 56 min   Short Term Goals: Week 1:  PT Short Term Goal 1 (Week 1): STG = LTGs due to LOS  Skilled Therapeutic Interventions/Progress Updates: Pt presents sitting in recliner and ready for therapy.  Pt amb multiple trials up to 200' per trial w/ supervision and occasional verbal cues for walker management and posture.  Pt performed Nu-step at Level 5 x 12' for LEs only w/o c/o.  Pt performed obstacle course through cones w/o AD including carrying red T-ball in front, and under each arm, picking up cones from floor and toe-taps to 1 3/4' platform.  Pt improves w/ control during taps as progresses.  Pt returned to room and to recliner w/ all needs in reach.     Therapy Documentation Precautions:  Precautions Precautions: Fall Precaution Comments: watch O2 sats Restrictions Weight Bearing Restrictions: No Other Position/Activity Restrictions: WBAT in shoes with carbon plate inserts per wife General:   Vital Signs:   Pain: no c/o pain. Pain Assessment Pain Scale: Faces Pain Score: 0-No pain Mobility:      Therapy/Group: Individual Therapy  Ladoris Gene 07/15/2019, 11:01 AM

## 2019-07-16 ENCOUNTER — Inpatient Hospital Stay (HOSPITAL_COMMUNITY): Payer: 59 | Admitting: Occupational Therapy

## 2019-07-16 ENCOUNTER — Telehealth: Payer: Self-pay | Admitting: Pulmonary Disease

## 2019-07-16 ENCOUNTER — Inpatient Hospital Stay (HOSPITAL_COMMUNITY): Payer: 59 | Admitting: Physical Therapy

## 2019-07-16 NOTE — Plan of Care (Signed)
  Problem: RH SAFETY Goal: RH STG ADHERE TO SAFETY PRECAUTIONS W/ASSISTANCE/DEVICE Description: STG Adhere to Safety Precautions With cues and reminders Outcome: Progressing Goal: RH STG DECREASED RISK OF FALL WITH ASSISTANCE Description: STG Decreased Risk of Fall With cues and reminders Outcome: Progressing   Problem: RH PAIN MANAGEMENT Goal: RH STG PAIN MANAGED AT OR BELOW PT'S PAIN GOAL Description: Pain level less than 4 on scale of 0-10 Outcome: Progressing

## 2019-07-16 NOTE — Progress Notes (Signed)
Pt on CPAP for the night. No further needs at this time. RT will continue to monitor.

## 2019-07-16 NOTE — Progress Notes (Signed)
[facility] PHYSICAL MEDICINE & REHABILITATION PROGRESS NOTE  Subjective/Complaints:   No problems overnite Conversation with pt's wife, Hakop Humbarger MD    ROS: Denies CP, SOB, N/V/D  Objective:   No results found. Recent Labs    07/13/19 0733  WBC 4.2  HGB 9.3*  HCT 29.3*  PLT 355   Recent Labs    07/13/19 0733  NA 135  K 4.0  CL 100  CO2 24  GLUCOSE 99  BUN 14  CREATININE 1.11  CALCIUM 9.3    Intake/Output Summary (Last 24 hours) at 07/16/2019 0728 Last data filed at 07/16/2019 7096 Gross per 24 hour  Intake 660 ml  Output 1775 ml  Net -1115 ml     Physical Exam: Vital Signs Blood pressure 127/72, pulse 73, temperature 98.4 F (36.9 C), resp. rate 16, weight 93.5 kg, SpO2 98 %.  General: No acute distress Mood and affect are appropriate Heart: Regular rate and rhythm no rubs murmurs or extra sounds Lungs: Clear to auscultation, breathing unlabored, no rales or wheezes Abdomen: Positive bowel sounds, soft nontender to palpation, nondistended Extremities: No clubbing, cyanosis, or edema Skin: No evidence of breakdown, no evidence of rash Neurologic: Cranial nerves II through XII intact, motor strength is 5/5 in bilateral deltoid, bicep, tricep, grip, hip flexor, knee extensors, ankle dorsiflexor and plantar flexor Sensory exam normal sensation to light touch and proprioception in bilateral upper and lower extremities Cerebellar exam normal finger to nose to finger as well as heel to shin in bilateral upper and lower extremities Musculoskeletal: Full range of motion in all 4 extremities. No joint swelling  Assessment/Plan: 1. Functional deficits secondary to debility  Tent d/c 7/16 F/u PCP in 2 weeks hospital f/u ,  F/u Cardiology Dr Claiborne Billings F/u ortho for non union metatarsal fx Dr Sharol Given F/u peripheral neuropathy and tremor Dr Jannifer Franklin F/u Pulmonary Dr Elsworth Soho OSA F/u ID ?- repeat BC in office Dr Wendie Agreste  Pt's wife would like to be present for D/C instructions  tomorrow Care Tool:  Bathing    Body parts bathed by patient: Right arm, Left arm, Chest, Abdomen, Front perineal area, Buttocks, Right upper leg, Left upper leg, Left lower leg, Right lower leg, Face         Bathing assist Assist Level: Supervision/Verbal cueing     Upper Body Dressing/Undressing Upper body dressing   What is the patient wearing?: Pull over shirt    Upper body assist Assist Level: Set up assist    Lower Body Dressing/Undressing Lower body dressing      What is the patient wearing?: Pants, Underwear/pull up     Lower body assist Assist for lower body dressing: Supervision/Verbal cueing     Toileting Toileting    Toileting assist Assist for toileting: Supervision/Verbal cueing Assistive Device Comment: urinal   Transfers Chair/bed transfer  Transfers assist     Chair/bed transfer assist level: Supervision/Verbal cueing     Locomotion Ambulation   Ambulation assist      Assist level: Supervision/Verbal cueing Assistive device: Rollator Max distance: 500'   Walk 10 feet activity   Assist     Assist level: Supervision/Verbal cueing Assistive device: Rollator   Walk 50 feet activity   Assist    Assist level: Supervision/Verbal cueing Assistive device: Rollator    Walk 150 feet activity   Assist    Assist level: Supervision/Verbal cueing Assistive device: Rollator    Walk 10 feet on uneven surface  activity   Assist Walk 10  feet on uneven surfaces activity did not occur: Safety/medical concerns   Assist level: Contact Guard/Touching assist Assistive device: Other (comment) (no device)   Wheelchair     Assist Will patient use wheelchair at discharge?: No Type of Wheelchair: Manual    Wheelchair assist level: Supervision/Verbal cueing Max wheelchair distance: 100    Wheelchair 50 feet with 2 turns activity    Assist        Assist Level: Supervision/Verbal cueing   Wheelchair 150 feet  activity     Assist  Wheelchair 150 feet activity did not occur: Safety/medical concerns       Blood pressure 127/72, pulse 73, temperature 98.4 F (36.9 C), resp. rate 16, weight 93.5 kg, SpO2 98 %.  Medical Problem List and Plan: 1. Debility secondary to bacterial urosepsis   Continue CIR Tent D/C in am  No need for OP OT post d/c 2.  Antithrombotics: -DVT/anticoagulation:  Pharmaceutical: Heparin- discontinued 7/13 as ambulating >200 feet.              -antiplatelet therapy: N/A 3. Pain Management:  Tramadol or Tylenol prn.   Controlled 7/15 4. Mood:  LCSW to follow for evaluation and support.              -antipsychotic agents: N/A 5. Neuropsych: This patient capable of making decisions on his own behalf currently.  6. Skin/Wound Care: Routine pressure relief measures.  7. Fluids/Electrolytes/Nutrition: continue supplements--increased prostat to tid.   8. Enterococcal bacteremia: Continue Zyvox for 2 weeks with with end date 7/11.  9. Obstructive lung disease/underlying scoliosis: Pulmonary hygiene and encourage OOB activity.  10. Hypercarbic respiratory failure/OSA:  Nees to use CPAP whenever napping and at nights. F/u Dr Elsworth Soho 11. Macrocytic anemia/Vitamin B 12 deficiency: On  Vitamin B 12 supplement.   Hemoglobin 9.5 on 7/7, 9.3 on 7/12  Continue to monitor 12.  Acute renal failure: Due to infection has resolved.  13. Chronic heart failure with preserved EF: Added low salt restrictions. Will monitor for signs of overload with daily weights and strict I/O. Continue metoprolol.  Filed Weights   07/14/19 0522 07/15/19 0308 07/16/19 0500  Weight: 99.6 kg 92.6 kg 93.5 kg   ?technique given wide fluctuations despite no change in diuretics, try standing weights  14. H/o "Excessive Alcohol use" /Hepatic steatosis:   LFTs within normal limits on 7/7   ETOH neuropathy likely as well 15. Hypokalemia: Resolved with supplementation.   Potassium 3.9 on 7/7, normal on 7/12.  16.  Hypomagnesemia: Chronic due to malnutrition?    Oral supplement changed and increased on 7/10  Magnesium 1.6 on 7/7, continues to be low on 7/12. Increase mag gluconate to 500mg  TID   7/13: Magnesium improved to 1.8. Continue TID supplement and recheck level as outpatient.   17. Hypophosphatemia:   Phosphorus within normal limits on 7/7 18. Right 5th MT fx: WBAT with carbon enforced shoes plates.  19. Essential tremor: Mild exacerbated by anxiety on Xanax 0.5mg  daily prn. Would avoid benzos, may consider SSRI, pt had nausea with prior SSRI, can use prn benzo, used sparingly at home per pt and spouse 46. HTN: Continue amlodipine 5mg  daily.  Vitals:   07/15/19 2041 07/16/19 0606  BP:  127/72  Pulse: 82 73  Resp: 16 16  Temp:  98.4 F (36.9 C)  SpO2: 98% 98%   Controlled on 7/15  LOS: 9 days A FACE TO FACE EVALUATION WAS PERFORMED  Charlett Blake 07/16/2019, 7:28 AM

## 2019-07-16 NOTE — Telephone Encounter (Signed)
Checked pt's chart and saw that pt is currently admitted at [facility]. Pt was admitted 7/6.   Called and spoke with pt's wife Louis [name] who stated that pt is currently in inpatient rehab but will be discharged tomorrow 7/16. Stated to her that we need to get pt scheduled with an APP as RA's first avail is not until September. Louis [name] verbalized understanding and pt has been scheduled an appt Monday 7/19 with SG. Nothing further needed.

## 2019-07-16 NOTE — Progress Notes (Signed)
Physical Therapy Session Note  Patient Details  Name: Louis [name] MRN: [id number] Date of Birth: [birth year]  Today's Date: 07/16/2019 PT Individual Time: 0805-0900 and 3536-1443 PT Individual Time Calculation (min): 55 min and 30 min  Short Term Goals: Week 1:  PT Short Term Goal 1 (Week 1): STG = LTGs due to LOS  Skilled Therapeutic Interventions/Progress Updates: Pt presented in recliner agreeable to therapy, pt denies pain during session. Pt ambulated to ortho gym with rollator and supervision. Participated in functional activities including car transfer, ramp, and gait on compliant surface all performed mod I/supervision. Pt ambulated to ADL apt and performed bed mobility independent. Pt ambulated to rehab gym and participated in ascending/descending x 12 steps with supervision. Participated in use of rebounder on level tile forward and with L/R rotation. Pt also participated forward bounces while standing on [employer], demonstrated with supervision and no LOB noted. Participated in obstacle course including weaving through cones, stepping over thresholds and walking on mat without AD and supervision. Participated in stepping over hockey sticks forward/backwards/sideways with emphasis stepping backwards over step. Pt then ambulated back to room at end of session with rollator and returned to recliner. Pt left with call bell within reach and needs met.   Tx2: Pt presented in recliner agreeable to therapy. Pt denies pain during session. Session focused on BLE strengthening and development of HEP. Pt ambulated to day room and participated in NuStep L5 x 10 min BLE only. Pt maintained avg 55-60 SPM. PTA then provided HEP as noted below and pt was able to perform and demonstrate safely. Pt ambulated back to room at end of session and left with all needs met.   Access Code: [email or web address] URL: [email or web address] Date: 07/16/2019 Prepared by: Karlyne Greenspan Preethi Scantlebury  Exercises Bridge with Hip  Abduction and Resistance - Ground Touches - 1 x daily - 7 x weekly - 2 sets - 10 reps Hip Abduction with Resistance Loop - 1 x daily - 7 x weekly - 2 sets - 10 reps Side Stepping with Resistance at Ankles - 1 x daily - 7 x weekly - 2 sets - 10 reps Sit to Stand with Ball Between Knees - 1 x daily - 7 x weekly - 1 sets - 10 reps      Therapy Documentation Precautions:  Precautions Precautions: Fall Precaution Comments: watch O2 sats Restrictions Weight Bearing Restrictions: No Other Position/Activity Restrictions: WBAT in shoes with carbon plate inserts per wife General:   Vital Signs:   Pain: Pain Assessment Pain Scale: Faces Pain Score: 0-No pain Mobility: Bed Mobility Supine to Sit: Independent Sit to Supine: Independent Transfers Transfers: Sit to Stand;Stand to Sit Sit to Stand: Independent with assistive device Stand to Sit: Independent with assistive device Stand Pivot Transfers: Independent with assistive device Transfer (Assistive device): Rolling walker Locomotion :    Trunk/Postural Assessment : Cervical Assessment Cervical Assessment: Exceptions to [facility] (forward head) Thoracic Assessment Thoracic Assessment: Exceptions to [facility] (history of scolioses with thoracic rounding as well) Lumbar Assessment Lumbar Assessment: Exceptions to [facility] (posterior pelvic tilt)  Balance: Balance Balance Assessed: Yes Static Sitting Balance Static Sitting - Balance Support: Feet supported Static Sitting - Level of Assistance: 7: Independent Dynamic Sitting Balance Dynamic Sitting - Balance Support: Feet supported Dynamic Sitting - Level of Assistance: 6: Modified independent (Device/Increase time) Static Standing Balance Static Standing - Balance Support: During functional activity Static Standing - Level of Assistance: 6: Modified independent (Device/Increase time) Dynamic Standing Balance Dynamic Standing - Level of Assistance: 6:  Modified independent (Device/Increase  time) Exercises:   Other Treatments:      Therapy/Group: Individual Therapy  Erza Mothershead 07/16/2019, 12:42 PM

## 2019-07-16 NOTE — Progress Notes (Signed)
Occupational Therapy Discharge Summary  Patient Details  Name: Louis [name] MRN: [id number] Date of Birth: 1955/02/17  Today's Date: 07/16/2019 OT Individual Time: 3419-3790 OT Individual Time Calculation (min): 54 min   Session Note:  Pt ambulated without an assistive device to the toilet for simulated toilet transfer to start.  He was able to complete sit to stand from the toilet with modified independence, pushing off of the toilet to complete task as he could not stand up without UE support.  He next ambulated down to the dayroom for work on dynamic standing balance with use of the Wii balance board and different activities.  Supervision for mobility without assistive device.  Occasional min assist needed for balance with use of the Wii as well as when stepping on and off of the board.  More consistent assist needed when stepping backwards off of the board compared to stepping up forward.  He continues to demonstrate some difficulty with forward weightshifts over his forefoot as he feels like he is going to fall.  Delayed balance reactions are still present in all directions however as well.  Finished session with ambulation back to the room with supervision.  Pt was made modified independent in the room with use of the rollator.  Call button and phone in reach at end of session if needed.    Patient has met 9 of 9 long term goals due to improved activity tolerance, improved balance, postural control, ability to compensate for deficits and improved coordination.  Patient to discharge at overall Modified Independent level.  [name] care partner is independent to provide the necessary physical and cognitive assistance at discharge.    Reasons goals not met: NA  Recommendation:  Pt is currently modified independent for all selfcare tasks and functional transfers with use of the rollator as well as a shower seat.  He continues to demonstrate decreased overall dynamic standing balance which can be  worked on further with outpatient PT.  No further OT needs at this time.   Equipment: No equipment provided  Reasons for discharge: treatment goals met and discharge from hospital  Patient/family agrees with progress made and goals achieved: Yes  OT Discharge Precautions/Restrictions  Precautions Precautions: Fall Precaution Comments: watch O2 sats Restrictions Weight Bearing Restrictions: No   Pain Pain Assessment Pain Scale: Faces Pain Score: 0-No pain ADL ADL Eating: Independent Where Assessed-Eating: Wheelchair Grooming: Independent Where Assessed-Grooming: Standing at sink Upper Body Bathing: Modified independent Where Assessed-Upper Body Bathing: Multimedia programmer, Chair Lower Body Bathing: Modified independent Where Assessed-Lower Body Bathing: Shower, Chair Upper Body Dressing: Independent Where Assessed-Upper Body Dressing: Chair Lower Body Dressing: Independent Where Assessed-Lower Body Dressing: Chair Toileting: Modified independent Where Assessed-Toileting: Glass blower/designer: Independent, Diplomatic Services operational officer Method: Counselling psychologist: Raised toilet seat Social research officer, government: Modified independent Social research officer, government Method: Heritage manager: Civil engineer, contracting with back Vision Baseline Vision/History: Wears glasses Wears Glasses: Distance only Patient Visual Report: No change from baseline Vision Assessment?: No apparent visual deficits Perception  Perception: Within Functional Limits Praxis Praxis: Intact Cognition Overall Cognitive Status: Within Functional Limits for tasks assessed Arousal/Alertness: Awake/alert Orientation Level: Oriented X4 Sustained Attention: Appears intact Selective Attention: Appears intact Memory: Impaired Memory Impairment: Retrieval deficit;Decreased short term memory Decreased Short Term Memory: Verbal complex;Verbal basic Awareness: Appears intact Problem Solving:  Appears intact Safety/Judgment: Appears intact Sensation Sensation Light Touch: Appears Intact Hot/Cold: Appears Intact Proprioception: Appears Intact Stereognosis: Appears Intact Additional Comments: Sensation intact in [place] Gross Motor  Movements are Fluid and Coordinated: No Fine Motor Movements are Fluid and Coordinated: No Coordination and Movement Description: Pt with bilateral tremors present in both UEs, left UE slightly worse than right but improved since admission. Motor  Motor Motor: Within Functional Limits Motor - Skilled Clinical Observations: generalized weakness, body tremors in standing Mobility  Bed Mobility Supine to Sit: Independent Sit to Supine: Independent Transfers Sit to Stand: Independent with assistive device Stand to Sit: Independent with assistive device  Trunk/Postural Assessment  Cervical Assessment Cervical Assessment: Exceptions to [facility] (forward head) Thoracic Assessment Thoracic Assessment: Exceptions to [facility] (history of scolioses with thoracic rounding as well) Lumbar Assessment Lumbar Assessment: Exceptions to [facility] (posterior pelvic tilt)  Balance Balance Balance Assessed: Yes Static Sitting Balance Static Sitting - Balance Support: Feet supported Static Sitting - Level of Assistance: 7: Independent Dynamic Sitting Balance Dynamic Sitting - Balance Support: Feet supported Dynamic Sitting - Level of Assistance: 6: Modified independent (Device/Increase time) Static Standing Balance Static Standing - Balance Support: During functional activity Static Standing - Level of Assistance: 6: Modified independent (Device/Increase time) Dynamic Standing Balance Dynamic Standing - Level of Assistance: 6: Modified independent (Device/Increase time) Extremity/Trunk Assessment RUE Assessment RUE Assessment: Exceptions to [facility] Active Range of Motion (AROM) Comments: AROM WFLS for all joints General Strength Comments: strength overall 4/5  throughout with noted tremors proximal and distal that were premorbid, improved since evaluation LUE Assessment LUE Assessment: Exceptions to [facility] Active Range of Motion (AROM) Comments: WFLs for all joints General Strength Comments: strength overall 4+/5 throughout with noted tremors proximal and distal that were premorbid, but less than on eval   Rogelio Waynick OTR/L 07/16/2019, 12:43 PM

## 2019-07-16 NOTE — Progress Notes (Signed)
Physical Therapy Discharge Summary  Patient Details  Name: Louis [name] MRN: [id number] Date of Birth: 11-02-1955  Today's Date: 07/16/2019 PT Individual Time: 1510-1605 PT Individual Time Calculation (min): 55 min    Patient has met 11 of 11 long term goals due to improved activity tolerance, improved balance, improved postural control, increased strength, ability to compensate for deficits, functional use of  right lower extremity and left lower extremity, improved awareness and improved coordination.  Patient to discharge at an ambulatory level Modified Independent using rollator.  [name] care partner attended family education/trianing and is independent to provide the necessary physical assistance at discharge.  All goals met.  Recommendation:  Patient will benefit from ongoing skilled PT services in outpatient setting to continue to advance safe functional mobility, address ongoing impairments in dynamic standing balance, B LE strength, gait training with LRAD, and minimize fall risk.  Equipment: No equipment provided - pt has all necessary DME  Reasons for discharge: treatment goals met and discharge from hospital  Patient/family agrees with progress made and goals achieved: Yes  Skilled Therapeutic Interventions/Progress Updates:  Pt received sitting in recliner and agreeable to therapy session. Sit<>stands using rollator mod-I throughout session. Gait ~254f to main therapy gym mod-I using rollator. Static standing balance task of normal BOS eyes closed 30seconds progressed to narrow BOS eyes closed 30 seconds - close supervision for safety - educated pt on performing this as part of his HEP at home with safe step-up of standing ~1 foot away from corner of a room with a sturdy chair in front of him - printout provided. Dynamic standing balance task of cross body reaching to place clothespins from low to high surface (including slight squat during task) with CGA for safety but no  significant unsteadiness noted. Dynamic gait using agility ladder via: forward stepping 2 feet in/2 feet out with CGA for steadying, lateral side stepping with CGA, and backwards walking with min assist for balance. Dynamic standing balance of PNF cross body diagonal rotations while holding 2kg weighted ball - CGA for safety but no significant unsteadiness. Gait training ~309fback to room using rollator focusing on dynamic challenges of sudden start/stops, horizontal head turns, and R/L turning with AD - no unsteadiness. Pt left seated in recliner with needs in reach - pt approved for mod-I in room using rollator.  PT Discharge Precautions/Restrictions Precautions Precautions: Fall Precaution Comments: watch O2 sats Restrictions Weight Bearing Restrictions: No Pain Pain Assessment Pain Scale: Faces Pain Score: 0-No pain Perception  Perception Perception: Within Functional Limits Praxis Praxis: Intact  Cognition Overall Cognitive Status: Within Functional Limits for tasks assessed Arousal/Alertness: Awake/alert Orientation Level: Oriented X4 Sustained Attention: Appears intact Selective Attention: Appears intact Memory: Impaired Memory Impairment: Retrieval deficit;Decreased short term memory Decreased Short Term Memory: Verbal complex;Verbal basic Awareness: Appears intact Problem Solving: Appears intact Safety/Judgment: Appears intact Sensation Sensation Light Touch: Appears Intact Hot/Cold: Not tested Proprioception: Appears Intact Stereognosis: Not tested Coordination Gross Motor Movements are Fluid and Coordinated: No Coordination and Movement Description: Gross motor movements of LEs uncoordinated when fatigued causing "shaking" Motor  Motor Motor: Within Functional Limits Motor - Discharge Observations: generalized weakness, body tremors in standing  Mobility Bed Mobility Supine to Sit: Independent Sit to Supine: Independent Transfers Transfers: Sit to  Stand;Stand to Sit;Stand Pivot Transfers Sit to Stand: Independent with assistive device Stand to Sit: Independent with assistive device Stand Pivot Transfers: Independent with assistive device Transfer (Assistive device): Rollator Locomotion  Gait Ambulation: Yes Gait Assistance: Independent with  assistive device Gait Distance (Feet): 400 Feet Assistive device: Rollator Gait Gait: Yes Gait Pattern: Within Functional Limits Gait velocity: decreased Stairs / Additional Locomotion Stairs: Yes Stairs Assistance: Independent with assistive device Stair Management Technique: Two rails Number of Stairs: 12 Height of Stairs: 6 Ramp: Supervision/Verbal cueing Wheelchair Mobility Wheelchair Mobility: No  Trunk/Postural Assessment  Cervical Assessment Cervical Assessment: Exceptions to WFL (slight forward head) Thoracic Assessment Thoracic Assessment: Exceptions to [facility] (hx of scoliosis) Lumbar Assessment Lumbar Assessment: Exceptions to [facility] (pelvic tilt due to scoliosis) Postural Control Postural Control: Within Functional Limits  Balance Balance Balance Assessed: Yes Static Sitting Balance Static Sitting - Balance Support: Feet supported Static Sitting - Level of Assistance: 7: Independent Dynamic Sitting Balance Dynamic Sitting - Balance Support: Feet supported Dynamic Sitting - Level of Assistance: 6: Modified independent (Device/Increase time) Static Standing Balance Static Standing - Balance Support: During functional activity Static Standing - Level of Assistance: 6: Modified independent (Device/Increase time) Dynamic Standing Balance Dynamic Standing - Balance Support: During functional activity Dynamic Standing - Level of Assistance: 6: Modified independent (Device/Increase time) Extremity Assessment  RLE Assessment RLE Assessment: Within Functional Limits General Strength Comments: grossly 4+/5 LLE Assessment LLE Assessment: Within Functional Limits General  Strength Comments: grossly 4/5    Tawana Scale , PT, DPT, CSRS  07/16/2019, 12:43 PM

## 2019-07-17 MED ORDER — ALPRAZOLAM 0.5 MG PO TABS: 0.5000 mg | ORAL_TABLET | Freq: Every day | ORAL | 0 refills | Status: DC | PRN

## 2019-07-17 MED ORDER — DOCUSATE SODIUM 100 MG PO CAPS: 100.0000 mg | ORAL_CAPSULE | Freq: Two times a day (BID) | ORAL | 0 refills | Status: DC | PRN

## 2019-07-17 MED ORDER — MAGNESIUM OXIDE 400 (241.3 MG) MG PO TABS: 400.0000 mg | ORAL_TABLET | Freq: Three times a day (TID) | ORAL | 0 refills | Status: DC

## 2019-07-17 MED ORDER — LOSARTAN POTASSIUM 100 MG PO TABS: 100.0000 mg | ORAL_TABLET | Freq: Every day | ORAL | 0 refills | Status: DC

## 2019-07-17 MED ORDER — METOPROLOL SUCCINATE ER 50 MG PO TB24: 50.0000 mg | ORAL_TABLET | Freq: Every day | ORAL | 0 refills | Status: DC

## 2019-07-17 MED ORDER — GABAPENTIN 300 MG PO CAPS: 300.0000 mg | ORAL_CAPSULE | Freq: Two times a day (BID) | ORAL | 0 refills | Status: DC

## 2019-07-17 MED ORDER — THIAMINE HCL 100 MG PO TABS: 100.0000 mg | ORAL_TABLET | Freq: Every day | ORAL | 0 refills | Status: DC

## 2019-07-17 MED ORDER — PRO-STAT SUGAR FREE PO LIQD: 30.0000 mL | Freq: Three times a day (TID) | ORAL | 0 refills | Status: DC

## 2019-07-17 MED ORDER — FAMOTIDINE 20 MG PO TABS: 20.0000 mg | ORAL_TABLET | Freq: Two times a day (BID) | ORAL | 0 refills | Status: DC

## 2019-07-17 MED ORDER — CYANOCOBALAMIN 1000 MCG PO TABS: 1000.0000 ug | ORAL_TABLET | Freq: Every day | ORAL | 0 refills | Status: DC

## 2019-07-17 MED ORDER — MELATONIN 3 MG PO TABS: 3.0000 mg | ORAL_TABLET | Freq: Every evening | ORAL | 0 refills | Status: DC | PRN

## 2019-07-17 NOTE — Progress Notes (Signed)
[facility] PHYSICAL MEDICINE & REHABILITATION PROGRESS NOTE  Subjective/Complaints: No complaints this morning. He is very thankful for his care here. Will review medications and follow-up appointments at 10am with patient and wife.    ROS: Denies CP, SOB, N/V/D  Objective:   No results found. No results for input(s): WBC, HGB, HCT, PLT in the last 72 hours. No results for input(s): NA, K, CL, CO2, GLUCOSE, BUN, CREATININE, CALCIUM in the last 72 hours.  Intake/Output Summary (Last 24 hours) at 07/17/2019 0934 Last data filed at 07/17/2019 0700 Gross per 24 hour  Intake 480 ml  Output 1175 ml  Net -695 ml     Physical Exam: Vital Signs Blood pressure 109/68, pulse 75, temperature 98.5 F (36.9 C), resp. rate 18, weight 93.5 kg, SpO2 99 %. General: Alert and oriented x 3, No apparent distress HEENT: Head is normocephalic, atraumatic, PERRLA, EOMI, sclera anicteric, oral mucosa pink and moist, dentition intact, ext ear canals clear,  Neck: Supple without JVD or lymphadenopathy Heart: Reg rate and rhythm. No murmurs rubs or gallops Chest: CTA bilaterally without wheezes, rales, or rhonchi; no distress Abdomen: Soft, non-tender, non-distended, bowel sounds positive. Extremities: Swelling present in bilateral ankle.  Skin: No evidence of breakdown, no evidence of rash Neurologic: Cranial nerves II through XII intact, motor strength is 5/5 in bilateral deltoid, bicep, tricep, grip, hip flexor, knee extensors, ankle dorsiflexor and plantar flexor Sensory exam normal sensation to light touch and proprioception in bilateral upper and lower extremities Cerebellar exam normal finger to nose to finger as well as heel to shin in bilateral upper and lower extremities Musculoskeletal: Full range of motion in all 4 extremities. No joint swelling   Assessment/Plan: 1. Functional deficits secondary to debility  Tent d/c 7/16 F/u PCP in 2 weeks hospital f/u ,  F/u Cardiology Dr Claiborne Billings F/u  ortho for non union metatarsal fx Dr Sharol Given F/u peripheral neuropathy and tremor Dr Jannifer Franklin F/u Pulmonary Dr Elsworth Soho OSA F/u ID ?- repeat [facility] in office Dr Wendie Agreste  Pt's wife would like to be present for D/C instructions tomorrow Care Tool:  Bathing    Body parts bathed by patient: Right arm, Left arm, Chest, Abdomen, Front perineal area, Buttocks, Right upper leg, Left upper leg, Left lower leg, Right lower leg, Face         Bathing assist Assist Level: Independent with assistive device Assistive Device Comment: shower seat   Upper Body Dressing/Undressing Upper body dressing   What is the patient wearing?: Pull over shirt    Upper body assist Assist Level: Independent    Lower Body Dressing/Undressing Lower body dressing      What is the patient wearing?: Pants, Underwear/pull up     Lower body assist Assist for lower body dressing: Independent     Toileting Toileting    Toileting assist Assist for toileting: Independent with assistive device Assistive Device Comment: rollator   Transfers Chair/bed transfer  Transfers assist     Chair/bed transfer assist level: Independent with assistive device Chair/bed transfer assistive device: Other (rollator)   Locomotion Ambulation   Ambulation assist      Assist level: Independent with assistive device Assistive device: Rollator Max distance: >435ft   Walk 10 feet activity   Assist     Assist level: Independent with assistive device Assistive device: Rollator   Walk 50 feet activity   Assist    Assist level: Independent with assistive device Assistive device: Rollator    Walk 150 feet activity  Assist    Assist level: Independent with assistive device Assistive device: Rollator    Walk 10 feet on uneven surface  activity   Assist Walk 10 feet on uneven surfaces activity did not occur: Safety/medical concerns   Assist level: Independent with assistive device Assistive device: Rollator    Wheelchair     Assist Will patient use wheelchair at discharge?: No Type of Wheelchair: Manual    Wheelchair assist level: Supervision/Verbal cueing Max wheelchair distance: 100    Wheelchair 50 feet with 2 turns activity    Assist        Assist Level: Supervision/Verbal cueing   Wheelchair 150 feet activity     Assist  Wheelchair 150 feet activity did not occur: Safety/medical concerns       Blood pressure 109/68, pulse 75, temperature 98.5 F (36.9 C), resp. rate 18, weight 93.5 kg, SpO2 99 %.  Medical Problem List and Plan: 1. Debility secondary to bacterial urosepsis   DC home today. F/u in clinic in 2-4 weeks.  2.  Antithrombotics: -DVT/anticoagulation:  Pharmaceutical: Heparin- discontinued 7/13 as ambulating >200 feet.              -antiplatelet therapy: N/A 3. Pain Management:  Tramadol or Tylenol prn.   Controlled 7/16 4. Mood:  LCSW to follow for evaluation and support.              -antipsychotic agents: N/A 5. Neuropsych: This patient capable of making decisions on his own behalf currently.  6. Skin/Wound Care: Routine pressure relief measures.  7. Fluids/Electrolytes/Nutrition: continue supplements--increased prostat to tid.   8. Enterococcal bacteremia: Continue Zyvox for 2 weeks with with end date 7/11.  9. Obstructive lung disease/underlying scoliosis: Pulmonary hygiene and encourage OOB activity.  10. Hypercarbic respiratory failure/OSA:  Nees to use CPAP whenever napping and at nights. F/u Dr Elsworth Soho 11. Macrocytic anemia/Vitamin B 12 deficiency: On  Vitamin B 12 supplement.   Hemoglobin 9.5 on 7/7, 9.3 on 7/12  Continue to monitor 12.  Acute renal failure: Due to infection has resolved.  13. Chronic heart failure with preserved EF: Added low salt restrictions. Will monitor for signs of overload with daily weights and strict I/O. Continue metoprolol.  Filed Weights   07/14/19 0522 07/15/19 0308 07/16/19 0500  Weight: 99.6 kg 92.6 kg 93.5  kg   ?technique given wide fluctuations despite no change in diuretics, try standing weights  14. H/o "Excessive Alcohol use" /Hepatic steatosis:   LFTs within normal limits on 7/7   ETOH neuropathy likely as well 15. Hypokalemia: Resolved with supplementation.   Potassium 3.9 on 7/7, normal on 7/12.  16. Hypomagnesemia: Chronic due to malnutrition?    Oral supplement changed and increased on 7/10  Magnesium 1.6 on 7/7, continues to be low on 7/12. Increase mag gluconate to 500mg  TID   7/13: Magnesium improved to 1.8. Continue TID supplement and recheck level as outpatient.   17. Hypophosphatemia:   Phosphorus within normal limits on 7/7 18. Right 5th MT fx: WBAT with carbon enforced shoes plates.  19. Essential tremor: Mild exacerbated by anxiety on Xanax 0.5mg  daily prn. Would avoid benzos, may consider SSRI, pt had nausea with prior SSRI, can use prn benzo, used sparingly at home per pt and spouse 17. HTN: Continue amlodipine 5mg  daily.  Vitals:   07/16/19 2335 07/17/19 0432  BP:  109/68  Pulse: 76 75  Resp: 18 18  Temp:  98.5 F (36.9 C)  SpO2: 97% 99%   Controlled on  7/16- little low. Can consider d/cing amlodipine in outpatient if remains low. 21. Bilateral ankle edema. Advised continued elevation, compression garments (which he has at home), and ice three times per day. He will discuss with his PCP whether he would benefit from Lasix.   >30 minutes spent in discharge of patient including review of medications and follow-up appointments, physical examination, assessment of edema and discussion of treatment, assessment of BP and plan for outpatient follow-up, and in answering all patient's questions  LOS: 10 days A FACE TO FACE EVALUATION WAS Falls 07/17/2019, 9:34 AM

## 2019-07-17 NOTE — Discharge Instructions (Signed)
Inpatient Rehab Discharge Instructions  Louis [name] Discharge date and time: 07/17/19   Activities/Precautions/ Functional Status: Activity: no lifting, driving, or strenuous exercise till cleared by MD Diet: regular diet Wound Care: none needed   Functional status:  ___ No restrictions     ___ Walk up steps independently ___ 24/7 supervision/assistance   ___ Walk up steps with assistance _X__ Intermittent supervision/assistance  ___ Bathe/dress independently ___ Walk with walker     ___ Bathe/dress with assistance ___ Walk Independently    ___ Shower independently ___ Walk with assistance    _X__ Shower with assistance _X__ No alcohol     ___ Return to work/school ________  Special Instructions:    COMMUNITY REFERRALS UPON DISCHARGE:    Outpatient: PT            Agency: [facility]  NZVJK:[phone]              Appointment Date/Time: Facility to Determine at Discharge     My questions have been answered and I understand these instructions. I will adhere to these goals and the provided educational materials after my discharge from the hospital.  Patient/Caregiver Signature _______________________________ Date __________  Clinician Signature _______________________________________ Date __________  Please bring this form and your medication list with you to all your follow-up doctor's appointments.

## 2019-07-17 NOTE — Discharge Summary (Signed)
Physician Discharge Summary  [name] MRN: [id number] DOB/AGE: 1955-05-15 64 y.o.  Admit date: 07/07/2019 Discharge date: 07/17/2019  Discharge Diagnoses:  Principal Problem:   Debility Active Problems:   Essential tremor   GERD (gastroesophageal reflux disease)   Hypomagnesemia   Macrocytic anemia   Bacteremia   Neuropathy   Discharged Condition: stable   Significant Diagnostic Studies: N/A   Labs:  Basic Metabolic Panel: BMP Latest Ref Rng & Units 07/13/2019 07/08/2019 07/07/2019  Glucose 70 - 99 mg/dL 99 99 101(H)  BUN 8 - 23 mg/dL _0 Creatinine 0.61 - 1.24 mg/dL 1.11 0.80 0.80  BUN/Creat Ratio 10 - 24 - - -  Sodium 135 - 145 mmol/L 135 135 134(L)  Potassium 3.5 - 5.1 mmol/L 4.0 3.9 3.6  Chloride 98 - 111 mmol/L 100 99 100  CO2 22 - 32 mmol/L _1 Calcium 8.9 - 10.3 mg/dL 9.3 9.4 8.8(L)    CBC: CBC Latest Ref Rng & Units 07/13/2019 07/08/2019 07/07/2019  WBC 4.0 - 10.5 K/uL 4.2 5.9 5.8  Hemoglobin 13.0 - 17.0 g/dL 9.3(L) 9.5(L) 8.7(L)  Hematocrit 39 - 52 % 29.3(L) 29.3(L) 26.6(L)  Platelets 150 - 400 K/uL 355 438(H) 385    CBG: No results for input(s): GLUCAP in the last 168 hours.  Brief HPI:   Louis [name] is a 64 y.o. male with history of familial tremors, restrictive lung disease with scoliosis, perforated duodenal ulcer, hepatic steatosis, moderate to excessive alcohol use, left Lisfranc fracture with dislocations s/p ORIF with onset of neuropathy, B12 deficiency, gait disorder with history of falls who was admitted to [facility] on 06/25/2019 with 2-week history of difficulty walking, confusion, shortness of breath and multiple falls.  He was found to have acute renal failure with hyperkalemia, hyponatremia and was febrile at admission.  Work-up done revealing sepsis due to Enterococcus bacteremia and urine culture also positive for Enterococcus but patient without symptoms.  CTA chest was negative for PE but showed severe hepatic steatosis  with 1 cm left upper lobe renal lesion.  TEE was negative for vegetation.  He was started on ampicillin but continued to have fevers therefore antibiotics changed to daptomycin on 06/27 by Dr. Baxter Flattery.  MRI abdomen was negative for abdominal abscess and 1 cm benign hemorrhagic cyst noted in upper pole of left upper kidneys.  Hospital course significant for waxing and waning of mental status due to delirium as well as respiratory arrest due to acute pulmonary edema requiring brief intubation.  He was treated with IV diuresis and BAL done showing inflammatory infiltrate with neutrophil predominance and antibiotics was broadened to include HCAP.  Due to concerns of daptomycin toxicity, Dr. Baxter Flattery recommended change of antibiotics to linezolid x14 days with 06/28 as day 1.  He tolerated extubation on 07/01 and mental status is been slowly improving.  Issues with electrolyte abnormalities are resolving with supplementation.  Macrocytic anemia was being monitored with H&H in 7.7-8.7 range.  Follow-up x-rays of right foot done showing increased lucency of fracture without definite callus and no change in alignment.  Therapy ongoing and patient was noted to be debilitated.  CIR was recommended due to functional deficits.   Hospital Course: Louis [name] was admitted to rehab 07/07/2019 for inpatient therapies to consist of PT and OT at least three hours five days a week. Past admission physiatrist, therapy team and rehab RN have worked together to provide customized collaborative inpatient rehab. He was maintained on Zyvox  thorough 07/11 and has tolerated this without SE. Colace was discontinued due to loose stools. Prostat was added to help with low calorie malnutrition. Follow up labs revealed that hyponatremia has resolved and renal status is stable. Follow up CBC showed improvement in H/H and platelets are stable.  He was maintained on Vitamin B 12 supplement and is reporting improvement in neuropathic symptoms.  Hypomagnesemia has resolved with increase in Mg supplementation to TID. Hypokalemia has resolved    Chronic heart failure is compensated and no signs of overload noted. Respiratory status is stable and he has been compliant with CPAP use. His blood pressures were monitored on TID basis and has been occasionally low but patient has been asymptomatic. Bilateral ankle edema noted with increase in mobility and elevation as well as use of compression stocking advised. Tremors have improved and not been a limiting factor with activity. He has made steady progress during his rehab stay and is currently at modified independent level with use of rollater.     Rehab course: During patient's stay in rehab  team conference was held to monitor patient's progress, set goals and discuss barriers to discharge. At admission, patient required min assist for transfers and with mobility. He has had improvement in activity tolerance, balance, postural control as well as ability to compensate for deficits. He is able to complete ADL tasks at modified independent level. He is modified independent for transfers and is able to ambulate 300' at modified independent level with use of rollater. Family education was completed regarding all aspects of care and safety.    Discharge disposition: 01-Home or Self Care  Diet: Low salt  Special Instructions: 1. Needs supervision if not using walker. 2.    Discharge Instructions    Ambulatory referral to Infectious Disease   Complete by: As directed    Follow up post treatment for bacteremia. Appointment in a week   Ambulatory referral to Neurology   Complete by: As directed    An appointment is requested in approximately: 2 weeks. Hx of tremors.   Ambulatory referral to Neurology   Complete by: As directed    An appointment is requested in approximately: 2-3 weeks for tremors   Ambulatory referral to Pulmonology   Complete by: As directed    Reason for referral:  Asthma/COPD     Allergies as of 07/17/2019      Reactions   Accupril [quinapril Hcl] Cough   Nsaids    Perforated gastric ulcer      Medication List    STOP taking these medications   feeding supplement ([employer] STANDARD 1.4) Liqd liquid   linezolid 600 MG tablet Commonly known as: ZYVOX   traMADol 50 MG tablet Commonly known as: ULTRAM     TAKE these medications   ALPRAZolam 0.5 MG tablet--Rx # 20 pills.  Commonly known as: XANAX Take 1 tablet (0.5 mg total) by mouth daily as needed (essential tremor).   amLODipine 5 MG tablet Commonly known as: NORVASC TAKE 1 TABLET(5 MG) BY MOUTH DAILY What changed: See the new instructions.   cyanocobalamin 1000 MCG tablet Take 1 tablet (1,000 mcg total) by mouth daily. Available over the counter What changed: additional instructions   docusate sodium 100 MG capsule Commonly known as: COLACE Take 1 capsule (100 mg total) by mouth 2 (two) times daily as needed for mild constipation. Available over the counter   feeding supplement (PRO-STAT SUGAR FREE 64) Liqd Take 30 mLs by mouth 3 (three) times daily with  meals. What changed: when to take this   folic acid 1 MG tablet Commonly known as: FOLVITE Take 1 tablet (1 mg total) by mouth daily.   gabapentin 300 MG capsule Commonly known as: NEURONTIN Take 1 capsule (300 mg total) by mouth 2 (two) times daily.   losartan 100 MG tablet Commonly known as: COZAAR Take 1 tablet (100 mg total) by mouth daily. What changed: additional instructions   magnesium oxide 400 (241.3 Mg) MG tablet Commonly known as: MAG-OX Take 1 tablet (400 mg total) by mouth with breakfast, with lunch, and with evening meal. What changed: when to take this   metoprolol succinate 50 MG 24 hr tablet Commonly known as: TOPROL-XL Take 1 tablet (50 mg total) by mouth daily. Take with a meal. What changed: additional instructions   multivitamin with minerals Tabs tablet Take 1 tablet by mouth daily.    thiamine 100 MG tablet Take 1 tablet (100 mg total) by mouth daily. Available over the counter What changed: additional instructions       Follow-up Information    Kirsteins, Luanna Salk, MD. Call.   Specialty: Physical Medicine and Rehabilitation Why: as needed Contact information: [place] [address] [phone]        Rutherford Guys, MD. Call in 1 day(s).   Specialty: Family Medicine Why: for post hospital follow up Contact information: [address] [place] [address] [phone]        Kathrynn Ducking, MD Follow up.   Specialty: Neurology Why: Office will call you with follow up appointment Contact information: [address] [place] [address] [phone]        Carlyle Basques, MD Follow up on 07/27/2019.   Specialty: Infectious Diseases Why: Appointment at 10 am Contact information: [place] [address] [phone]        Magdalen Spatz, NP Follow up on 07/20/2019.   Specialty: Pulmonary Disease Why: Appointment at 3:30 pm Contact information: [place] [address] [phone]        Newt Minion, MD. Call.   Specialty: Orthopedic Surgery Why: for follow up on right foot Contact information: [place] [address] [phone]               Signed: Bary Leriche 07/20/2019, 7:12 PM

## 2019-07-17 NOTE — Progress Notes (Signed)
Inpatient Rehabilitation Care Coordinator  Discharge Note  The overall goal for the admission was met for:   Discharge location: Yes, Home  Length of Stay: Yes, 10 Days  Discharge activity level: Yes, Modified Independent  Home/community participation: Yes  Services provided included: MD, RD, PT, OT, SLP, RN, CM, TR, Pharmacy, Neuropsych and SW  Financial Services: Private Insurance: [employer]  Follow-up services arranged: Outpatient: [facility]  Comments (or additional information):  Patient/Family verbalized understanding of follow-up arrangements: Yes  Individual responsible for coordination of the follow-up plan: [name] [phone]  Confirmed correct DME delivered: [name] 07/17/2019    [name]

## 2019-07-20 ENCOUNTER — Other Ambulatory Visit: Payer: Self-pay

## 2019-07-20 ENCOUNTER — Telehealth: Payer: Self-pay | Admitting: *Deleted

## 2019-07-20 ENCOUNTER — Encounter: Payer: Self-pay | Admitting: Acute Care

## 2019-07-20 ENCOUNTER — Ambulatory Visit (INDEPENDENT_AMBULATORY_CARE_PROVIDER_SITE_OTHER): Payer: 59 | Admitting: Acute Care

## 2019-07-20 VITALS — BP 110/70 | HR 78 | Temp 97.3°F | Ht 71.5 in | Wt 213.0 lb

## 2019-07-20 DIAGNOSIS — J9602 Acute respiratory failure with hypercapnia: Secondary | ICD-10-CM | POA: Diagnosis not present

## 2019-07-20 DIAGNOSIS — A419 Sepsis, unspecified organism: Secondary | ICD-10-CM

## 2019-07-20 DIAGNOSIS — G4733 Obstructive sleep apnea (adult) (pediatric): Secondary | ICD-10-CM | POA: Diagnosis not present

## 2019-07-20 DIAGNOSIS — Z9989 Dependence on other enabling machines and devices: Secondary | ICD-10-CM

## 2019-07-20 DIAGNOSIS — J9601 Acute respiratory failure with hypoxia: Secondary | ICD-10-CM | POA: Diagnosis not present

## 2019-07-20 DIAGNOSIS — G629 Polyneuropathy, unspecified: Secondary | ICD-10-CM

## 2019-07-20 DIAGNOSIS — R6521 Severe sepsis with septic shock: Secondary | ICD-10-CM

## 2019-07-20 NOTE — Progress Notes (Signed)
History of Present Illness Louis [name] is a 64 y.o. male with PMH significant for HTN, CHF, COPD, sleep apnea on CPAP , and scoliosis with  recent hospitalization  06/25/2019-7/17/2021with sepsis 2/2 Enterococcus in his blood and urine. Additionally he required intubation 6/30-7/1 for hypercarbic respiratory failure/ HAP prior to admission to inpatient rehab 7/6-7/17. The patient was treated by Dr. Lake Bells and Dr. Ander Slade as an inpatient. He is followed by [name] for his OSA.   07/20/2019  Pt. Presents for hospital follow up. 64 year old male admitted on June 25, 2019 in the setting of fever, tachypnea and some confusion. Found to have enterococcal bacteremia of a urinary source. Has been followed by infectious diseases, had a TEE which was unrevealing. Was treated as an inpatient  with appropriate antibiotic therapy and his condition was improving.  He  was to be discharged to inpatient rehab on 07/01/2019, but had a respiratory arrested ( Hypercarbic)  on 6/30 and was transferred to ICU. He was intubated and diuresed.He was extubated 26 hours later.  He was not placed on his CPAP the night prior to his arrest. . ( He refused). He was bronched by Dr. Lake Bells. BAL was done. Cultures were resulted as a normal flora. He has been treated with linezolid for 14 days. He was also treated with ceftriaxone >ampicillin  and daptomycin . He has follow up with ID.   He presents for follow up. He was discharged from inpatient rehab 07/18/2019. He is doing well. He has been discharged without beta blocker of diuretic. I explained to the patient's wife that his BP was 120/70, and he does not need beta blocker at present. I have asked them to monitor his BP at home , and they need follow up with Dr. Claiborne Billings regarding BP meds and diuretic. The patient has had no fever, chest pain, orthopnea or hemoptysis. [name] He states any secretions he has are clear. He is compliant with his CPAP with oxygen 2 L [place] . He has no am  headaches or daytime sleepiness. Saturations are 99% on RA, and the patient is in no distress. Weight is stable since discharge.   Test Results: 07/01/2019 Chest X ray Interval placement of endotracheal tube approximately 4 cm above the carina. No interval change in the appearance of the lungs compared to earlier today. Lung bases excluded from chest radiograph, included on abdominal radiograph. Findings remain concerning for bilateral airspace disease with concomitant effusions RIGHT greater than LEFT. PICC line remains in place, tip not well assessed but grossly similar to the prior exam where was seen at the caval to atrial junction.  June 26, 2019 TTE: LVEF 55 to 33%, RV systolic function mildly reduced, aortic dilation, valves normal as visualized June 28 TEE: LVEF normal, RV systolic function normal, mitral and aortic valves normal 6/24 SARS COV 2 > negative 6/24 blood > enterococcus faecalis amp sensitive 6/24 urine culture  6/26 blood > negative  PFT's 08/15/2017>> suggestive extraparenchymal restriction     NPSG AHI 39/h, >> CPAP 9 cm + 2L o2   CBC Latest Ref Rng & Units 07/13/2019 07/08/2019 07/07/2019  WBC 4.0 - 10.5 K/uL 4.2 5.9 5.8  Hemoglobin 13.0 - 17.0 g/dL 9.3(L) 9.5(L) 8.7(L)  Hematocrit 39 - 52 % 29.3(L) 29.3(L) 26.6(L)  Platelets 150 - 400 K/uL 355 438(H) 385    BMP Latest Ref Rng & Units 07/13/2019 07/08/2019 07/07/2019  Glucose 70 - 99 mg/dL 99 99 101(H)  BUN 8 - 23 mg/dL 14 9  14  Creatinine 0.61 - 1.24 mg/dL 1.11 0.80 0.80  BUN/Creat Ratio 10 - 24 - - -  Sodium 135 - 145 mmol/L 135 135 134(L)  Potassium 3.5 - 5.1 mmol/L 4.0 3.9 3.6  Chloride 98 - 111 mmol/L 100 99 100  CO2 22 - 32 mmol/L _0 Calcium 8.9 - 10.3 mg/dL 9.3 9.4 8.8(L)    BNP    Component Value Date/Time   BNP 229.1 (H) 06/25/2019 1217    ProBNP No results found for: PROBNP  PFT    Component Value Date/Time   FEV1PRE 1.87 08/15/2017 1002   FEV1POST 1.97 08/15/2017 1002    FVCPRE 2.90 08/15/2017 1002   FVCPOST 2.79 08/15/2017 1002   TLC 5.72 08/15/2017 1002   DLCOUNC 27.00 08/15/2017 1002   PREFEV1FVCRT 64 08/15/2017 1002   PSTFEV1FVCRT 71 08/15/2017 1002    DG Abd 1 View  Result Date: 07/01/2019 CLINICAL DATA:  Nasogastric tube placement EXAM: ABDOMEN - 1 VIEW COMPARISON:  January 08, 2019 FINDINGS: Nasogastric tube tip and side port are in the proximal stomach. No evident bowel dilatation or air-fluid levels. No free air. Atelectatic change noted in right lung base. Marked lower thoracic and lumbar scoliosis noted. IMPRESSION: Nasogastric tube tip and side port in proximal stomach. No bowel obstruction or free air appreciable. Marked scoliosis. Electronically Signed   By: Lowella Grip III M.D.   On: 07/01/2019 08:39   CT HEAD WO CONTRAST  Result Date: 07/01/2019 CLINICAL DATA:  Mental status change with unknown cause. Hypercarbia and hypoxemia EXAM: CT HEAD WITHOUT CONTRAST TECHNIQUE: Contiguous axial images were obtained from the base of the skull through the vertex without intravenous contrast. COMPARISON:  06/25/2019 brain MRI FINDINGS: Brain: No evidence of acute infarction, hemorrhage, hydrocephalus, extra-axial collection or mass lesion/mass effect. Generalized cortical and cerebellar atrophy greater than expected for age. Vascular: No hyperdense vessel or unexpected calcification. Skull: Normal. Negative for fracture or focal lesion. Sinuses/Orbits: No acute finding. Other: [name] oropharyngeal catheter loops through the nasopharynx. The tip is at the stomach by prior radiograph IMPRESSION: 1. No acute finding. 2. Generalized atrophy. 3. The orogastric tube loops through the nasopharynx but the tip reaches the stomach by prior radiograph Electronically Signed   By: Monte Fantasia M.D.   On: 07/01/2019 10:39   CT Head Wo Contrast  Result Date: 06/25/2019 CLINICAL DATA:  Frequent falls EXAM: CT HEAD WITHOUT CONTRAST TECHNIQUE: Contiguous axial images  were obtained from the base of the skull through the vertex without intravenous contrast. COMPARISON:  None. FINDINGS: Brain: There is no acute intracranial hemorrhage, mass effect, or edema. Gray-white differentiation is preserved. There is no extra-axial fluid collection. Prominence of the ventricles and sulci reflects mild generalized parenchymal volume loss. Vascular: There is mild atherosclerotic calcification at the skull base. Skull: Calvarium is unremarkable. Sinuses/Orbits: No acute finding. Other: None. IMPRESSION: No acute intracranial hemorrhage, mass effect, or evidence of acute infarction. Electronically Signed   By: Macy Mis M.D.   On: 06/25/2019 14:43   CT Angio Chest PE W/Cm &/Or Wo Cm  Result Date: 06/25/2019 CLINICAL DATA:  64 year old male with history of shortness of breath. EXAM: CT ANGIOGRAPHY CHEST WITH CONTRAST TECHNIQUE: Multidetector CT imaging of the chest was performed using the standard protocol during bolus administration of intravenous contrast. Multiplanar CT image reconstructions and MIPs were obtained to evaluate the vascular anatomy. CONTRAST:  70m OMNIPAQUE IOHEXOL 350 MG/ML SOLN COMPARISON:  Chest CTA 07/02/2017. FINDINGS: Cardiovascular: Today's study is significantly limited by  large amount of patient respiratory motion. With this limitation in mind, there is no definite central, lobar or segmental sized pulmonary embolism. Smaller subsegmental sized pulmonary emboli cannot be completely excluded given the limitations of today's examination. Heart size is normal. There is no significant pericardial fluid, thickening or pericardial calcification. Aortic atherosclerosis. No definite coronary artery calcifications. Mediastinum/Nodes: No pathologically enlarged mediastinal or hilar lymph nodes. Esophagus is unremarkable in appearance. No axillary lymphadenopathy. Lungs/Pleura: No suspicious appearing pulmonary nodules or masses are noted. No acute consolidative airspace  disease. No pleural effusions. Scattered areas of mild linear scarring in the lung bases bilaterally. Upper Abdomen: Severe diffuse low attenuation throughout the hepatic parenchyma, indicative of severe hepatic steatosis. 1 cm exophytic high attenuation lesion (58 HU) lesion in the upper pole the left kidney, incompletely characterized. 4.5 cm low-attenuation lesion in the upper pole the left kidney, compatible with a simple cyst. Small low-attenuation lesion incompletely imaged in the upper pole the right kidney, also likely to represent a small cyst. Musculoskeletal: Severe dextroscoliosis of the thoracolumbar spine. There are no aggressive appearing lytic or blastic lesions noted in the visualized portions of the skeleton. Review of the MIP images confirms the above findings. IMPRESSION: 1. Limited examination secondary to patient respiratory motion demonstrating no central, lobar or segmental sized pulmonary embolism. 2. No acute findings are noted in the thorax to account for the patient's symptoms. 3. Aortic atherosclerosis. 4. Severe hepatic steatosis. 5. 1 cm exophytic high attenuation lesion in the upper pole the left kidney, incompletely characterized on today's examination. Follow-up evaluation with nonemergent abdominal MRI with and without IV gadolinium is strongly recommended in the near future to exclude the possibility of neoplasm. Aortic Atherosclerosis (ICD10-I70.0). Electronically Signed   By: Vinnie Langton M.D.   On: 06/25/2019 14:47   CT Cervical Spine Wo Contrast  Result Date: 06/25/2019 CLINICAL DATA:  Multiple falls EXAM: CT CERVICAL SPINE WITHOUT CONTRAST TECHNIQUE: Multidetector CT imaging of the cervical spine was performed without intravenous contrast. Multiplanar CT image reconstructions were also generated. COMPARISON:  None. FINDINGS: Alignment: Facet joints aligned without dislocation. Dens and lateral masses aligned. No static listhesis. Skull base and vertebrae: No acute  fracture. Rounded 8 mm area of lucency within the right posterolateral aspect of the C3 vertebral body (series 6, image 21). 9 mm sclerotic lesion within the right posterolateral aspect of the C6 vertebral body (series 5, image 21). Lesions are nonexpansile. Soft tissues and spinal canal: No prevertebral fluid or swelling. No visible canal hematoma. Disc levels: Intervertebral disc height loss at C3-4 through C5-6. No evidence of high-grade foraminal or canal stenosis by CT. Upper chest: Visualized lung apices are clear. Other: None. IMPRESSION: 1. No acute fracture or static listhesis of the cervical spine. 2. Mild multilevel degenerative changes of the cervical spine. 3. Rounded 8 mm area of lucency within the C3 vertebral body and 9 mm sclerotic lesion within the C6 vertebral body. Findings are nonspecific and may represent benign lesions such as hemangioma and bone island. However, if there is concern for metastatic disease, a nonemergent nuclear medicine bone scan could be performed for further evaluation. Electronically Signed   By: Davina Poke D.O.   On: 06/25/2019 14:34   MR BRAIN WO CONTRAST  Result Date: 06/25/2019 CLINICAL DATA:  Stroke.  Weakness. EXAM: MRI HEAD WITHOUT CONTRAST TECHNIQUE: Multiplanar, multiecho pulse sequences of the brain and surrounding structures were obtained without intravenous contrast. COMPARISON:  CT head 06/25/2019 FINDINGS: Brain: Negative for acute infarct Moderate atrophy. Minimal  white matter changes. Brainstem and cerebellum intact. Negative for hemorrhage or mass. No fluid collection or midline shift. Vascular: Normal arterial flow voids. Skull and upper cervical spine: Normal bone marrow Sinuses/Orbits: Paranasal sinuses clear. No orbital lesion. Other: None IMPRESSION: Negative for acute infarct Moderate atrophy.  No acute abnormality. Electronically Signed   By: Franchot Gallo M.D.   On: 06/25/2019 18:32   MR ABDOMEN W WO CONTRAST  Result Date:  06/27/2019 CLINICAL DATA:  Indeterminate left renal lesion on recent chest CTA. Suspected abdominal infection or abscess. Renal insufficiency. EXAM: MRI ABDOMEN WITHOUT AND WITH CONTRAST TECHNIQUE: Multiplanar multisequence MR imaging of the abdomen was performed both before and after the administration of intravenous contrast. CONTRAST:  20m GADAVIST GADOBUTROL 1 MMOL/ML IV SOLN COMPARISON:  Chest CTA on 06/25/2019, and AP CT on 01/05/2019 FINDINGS: Lower chest: No acute findings. Hepatobiliary: No hepatic masses identified. A tiny T2 hyperintense lesion is seen in the inferior right hepatic lobe which is too small to characterize but most likely represents a tiny cyst. Marked diffuse hepatic steatosis is seen. Gallbladder is unremarkable. No evidence of biliary ductal dilatation. Pancreas:  No mass or inflammatory changes. Spleen:  Within normal limits in size and appearance. Adrenals/Urinary Tract: Normal adrenal glands. Several simple renal cysts are seen in both kidneys. A 1 cm subcapsular lesion is seen in the posterior upper pole the left kidney which shows marked T1 hyperintensity, T2 hypointensity, and lack of contrast enhancement on subtraction imaging. This is consistent with a benign Bosniak category 2 hemorrhagic cyst and corresponds with the lesion seen recent chest CTA. This is also stable compared to earlier abdomen CT. No evidence of renal masses. Mild right renal pelvicaliectasis is stable compared to previous CT. Stomach/Bowel: Visualized portion unremarkable. Vascular/Lymphatic: No pathologically enlarged lymph nodes identified. No abdominal aortic aneurysm. Other:  None. Musculoskeletal:  No suspicious bone lesions identified. IMPRESSION: 1. No evidence of abdominal abscess or other acute findings. 2. 1 cm benign Bosniak category 2 hemorrhagic cyst in the upper pole of left kidney, which corresponds with the lesion seen on recent chest CTA. Other benign Bosniak category 1 renal cysts are also  seen bilaterally. No evidence of renal neoplasm. 3. Stable mild right renal pelvicaliectasis. 4. Marked hepatic steatosis. Electronically Signed   By: JMarlaine HindM.D.   On: 06/27/2019 11:02   Portable Chest x-ray  Result Date: 07/01/2019 CLINICAL DATA:  ETT placement. EXAM: PORTABLE CHEST 1 VIEW COMPARISON:  07/01/2019 at 6:43 a.m. FINDINGS: Endotracheal tube present approximately 4 cm above the carina. Gastric tube courses through in off the field of the radiograph into the upper stomach, see dedicated abdominal film for further detail. Mediastinal contours are partially obscured. Lung bases excluded from chest radiograph, included on abdominal radiograph. Persistent opacity and effusion noted at the RIGHT lung base. Increased interstitial markings throughout the chest with graded opacity also in the LEFT chest. LEFT hemidiaphragm remains visible. RIGHT-sided PICC line remains in place. Multiple leads partially obscure the tip of the PICC line. Exact position difficult to determine but grossly unchanged compared to the recent chest x-ray likely near the caval to atrial junction. IMPRESSION: 1. Interval placement of endotracheal tube approximately 4 cm above the carina. 2. No interval change in the appearance of the lungs compared to earlier today. Lung bases excluded from chest radiograph, included on abdominal radiograph. Findings remain concerning for bilateral airspace disease with concomitant effusions RIGHT greater than LEFT. 3. PICC line remains in place, tip not well assessed but grossly  similar to the prior exam where was seen at the caval to atrial junction. Electronically Signed   By: Zetta Bills M.D.   On: 07/01/2019 08:43   DG CHEST PORT 1 VIEW  Result Date: 07/01/2019 CLINICAL DATA:  Increased shortness of breath EXAM: PORTABLE CHEST 1 VIEW COMPARISON:  Yesterday FINDINGS: Hazy opacity on both sides that is progressed. Prominent heart size accentuated by scoliotic curvature. No visible  pneumothorax. Right PICC with tip at the SVC. IMPRESSION: Increased hazy opacity on both sides that is likely a combination of pleural fluid and atelectasis/pneumonia. Electronically Signed   By: Monte Fantasia M.D.   On: 07/01/2019 07:13   DG CHEST PORT 1 VIEW  Result Date: 06/30/2019 CLINICAL DATA:  Respiratory failure. EXAM: PORTABLE CHEST 1 VIEW COMPARISON:  Radiograph yesterday.  Chest CT 06/25/2019 FINDINGS: Stable prominent heart size, distorted by scoliosis. Increasing hazy opacity at the right greater than left lung base. Patchy bibasilar opacities. No pneumothorax. Upper lungs are clear. Severe scoliotic curvature of the spine. IMPRESSION: Increasing hazy and patchy opacity at the right greater than left lung base, increasing atelectasis versus developing pleural effusions. Electronically Signed   By: Keith Rake M.D.   On: 06/30/2019 19:23   DG CHEST PORT 1 VIEW  Result Date: 06/29/2019 CLINICAL DATA:  Shortness of breath EXAM: PORTABLE CHEST 1 VIEW COMPARISON:  06/27/2019 FINDINGS: Hazy opacification of the bilateral chest which is unchanged. Borderline heart size which is distorted by scoliosis. No pneumothorax or visible effusion. IMPRESSION: Stable hazy chest opacification favoring atelectasis. Electronically Signed   By: Monte Fantasia M.D.   On: 06/29/2019 11:40   DG CHEST PORT 1 VIEW  Result Date: 06/27/2019 CLINICAL DATA:  Hypoxia, shortness of breath and weakness. EXAM: PORTABLE CHEST 1 VIEW COMPARISON:  06/25/2019 FINDINGS: Normal sized heart. Interval linear densities at both lung bases. Stable severe dextroconvex thoracic scoliosis. IMPRESSION: Interval bibasilar linear atelectasis. Electronically Signed   By: Claudie Revering M.D.   On: 06/27/2019 18:40   DG Chest Port 1 View  Result Date: 06/25/2019 CLINICAL DATA:  Shortness of breath. Additional provided: History of COPD, CHF, shortness of breath, hypertension. EXAM: PORTABLE CHEST 1 VIEW COMPARISON:  Chest radiograph  01/06/2019 FINDINGS: Unchanged cardiomegaly. Hazy opacity throughout the left mid to lower lung field. The right lung is grossly clear. No evidence of pneumothorax. No acute bony abnormality. Redemonstrated prominent thoracic dextrocurvature. Healed fracture of the posterior left eighth rib. IMPRESSION: Hazy opacity throughout the left mid to lower lung field. This may reflect pneumonia, atelectasis asymmetric edema or possibly a layering pleural effusion. The right lung is grossly clear. Unchanged cardiomegaly. Electronically Signed   By: Kellie Simmering DO   On: 06/25/2019 12:30   DG Foot 2 Views Right  Result Date: 07/06/2019 CLINICAL DATA:  History of fracture the 5th metatarsal 2 weeks ago. EXAM: RIGHT FOOT - 2 VIEW COMPARISON:  06/16/2019 FINDINGS: Comminuted fracture of the base of the 5th metatarsal shows increased lucency at the fracture site. No definitive callus. No change in alignment. There is increased soft tissue swelling IMPRESSION: Increased lucency at the fracture site. No definitive callus. Electronically Signed   By: Nolon Nations M.D.   On: 07/06/2019 13:56   ECHOCARDIOGRAM COMPLETE  Result Date: 06/27/2019    ECHOCARDIOGRAM REPORT   Patient Name:   FILMORE MOLYNEUX Date of Exam: 06/26/2019 Medical Rec #:  [id number]    Height:       71.0 in Accession #:    [id number]  Weight:       220.0 lb Date of Birth:  1955/06/21    BSA:          2.196 m Patient Age:    8 years     BP:           121/73 mmHg Patient Gender: M            HR:           86 bpm. Exam Location:  Inpatient Procedure: 2D Echo, Cardiac Doppler, Color Doppler and Intracardiac            Opacification Agent Indications:    Bacteremia 790.7 / R78.81  History:        Patient has prior history of Echocardiogram examinations, most                 recent 07/03/2017. Cardiomegaly, Pulmonary HTN,                 Signs/Symptoms:Shortness of Breath; Risk Factors:Hypertension                 and Non-Smoker. Sepsis.  Sonographer:    Vickie Epley  RDCS Referring Phys: [id number] Patrecia Pour  Sonographer Comments: Technically difficult study due to poor echo windows. Image acquisition challenging due to patient body habitus. IMPRESSIONS  1. Left ventricular ejection fraction, by estimation, is 55 to 60%. The left ventricle has normal function. The left ventricle has no regional wall motion abnormalities. Left ventricular diastolic parameters were normal.  2. Right ventricular systolic function is mildly reduced at the RV apex. The right ventricular size is normal. Tricuspid regurgitation signal is inadequate for assessing PA pressure.  3. LA not well visualized. Left atrial size was mildly dilated by visual estimate.  4. The mitral valve is normal in structure. Trivial mitral valve regurgitation. No evidence of mitral stenosis.  5. The aortic valve is tricuspid. Aortic valve regurgitation is not visualized. No aortic stenosis is present.  6. Aortic dilatation noted. There is mild dilatation of the aortic root measuring 43 mm.  7. The inferior vena cava is normal in size with greater than 50% respiratory variability, suggesting right atrial pressure of 3 mmHg. Conclusion(s)/Recommendation(s): No evidence of valvular vegetations on this transthoracic echocardiogram. Would recommend a transesophageal echocardiogram to exclude infective endocarditis if clinically indicated. This exam was suboptimal for the assessment of valvular lesions due to image quality. FINDINGS  Left Ventricle: Left ventricular ejection fraction, by estimation, is 55 to 60%. The left ventricle has normal function. The left ventricle has no regional wall motion abnormalities. Definity contrast agent was given IV to delineate the left ventricular  endocardial borders. The left ventricular internal cavity size was normal in size. There is no left ventricular hypertrophy. Left ventricular diastolic parameters were normal. Right Ventricle: The right ventricular size is normal. No increase in right  ventricular wall thickness. Right ventricular systolic function is mildly reduced at the RV apex. Tricuspid regurgitation signal is inadequate for assessing PA pressure. Left Atrium: LA not well visualized. Left atrial size was mildly dilated. Right Atrium: Right atrial size was not well visualized. Pericardium: There is no evidence of pericardial effusion. Presence of pericardial fat pad. Mitral Valve: The mitral valve is normal in structure. Normal mobility of the mitral valve leaflets. Trivial mitral valve regurgitation. No evidence of mitral valve stenosis. Tricuspid Valve: The tricuspid valve is normal in structure. Tricuspid valve regurgitation is not demonstrated. No evidence of tricuspid stenosis. Aortic Valve: The aortic valve  is tricuspid. Aortic valve regurgitation is not visualized. No aortic stenosis is present. There is mild calcification of the aortic valve. Pulmonic Valve: The pulmonic valve was grossly normal. Pulmonic valve regurgitation is trivial. No evidence of pulmonic stenosis. Aorta: Aortic dilatation noted. There is mild dilatation of the aortic root measuring 43 mm. Venous: The inferior vena cava is normal in size with greater than 50% respiratory variability, suggesting right atrial pressure of 3 mmHg. IAS/Shunts: No atrial level shunt detected by color flow Doppler.  LEFT VENTRICLE PLAX 2D LVIDd:         5.50 cm      Diastology LVIDs:         4.40 cm      LV e' lateral:   11.70 cm/s LV PW:         0.90 cm      LV E/e' lateral: 4.5 LV IVS:        0.90 cm      LV e' medial:    7.94 cm/s LVOT diam:     2.20 cm      LV E/e' medial:  6.6 LV SV:         72 LV SV Index:   33 LVOT Area:     3.80 cm  LV Volumes (MOD) LV vol d, MOD A2C: 119.0 ml LV vol d, MOD A4C: 156.0 ml LV vol s, MOD A2C: 62.5 ml LV vol s, MOD A4C: 60.4 ml LV SV MOD A2C:     56.5 ml LV SV MOD A4C:     156.0 ml LV SV MOD BP:      82.2 ml RIGHT VENTRICLE RV S prime:     12.50 cm/s TAPSE (M-mode): 1.6 cm LEFT ATRIUM              Index       RIGHT ATRIUM           Index LA diam:        4.20 cm 1.91 cm/m  RA Area:     13.20 cm LA Vol (A2C):   35.3 ml 16.08 ml/m RA Volume:   32.00 ml  14.57 ml/m LA Vol (A4C):   56.5 ml 25.73 ml/m LA Biplane Vol: 44.6 ml 20.31 ml/m  AORTIC VALVE LVOT Vmax:   117.00 cm/s LVOT Vmean:  79.700 cm/s LVOT VTI:    0.189 m  AORTA Ao Root diam: 4.30 cm MITRAL VALVE MV Area (PHT): 3.74 cm    SHUNTS MV Decel Time: 203 msec    Systemic VTI:  0.19 m MV E velocity: 52.60 cm/s  Systemic Diam: 2.20 cm MV A velocity: 53.00 cm/s MV E/A ratio:  0.99 Cherlynn Kaiser MD Electronically signed by Cherlynn Kaiser MD Signature Date/Time: 06/27/2019/6:20:35 AM    Final    ECHO TEE  Result Date: 06/29/2019    TRANSESOPHOGEAL ECHO REPORT   Patient Name:   [name] Date of Exam: 06/29/2019 Medical Rec #:  [id number]    Height:       71.0 in Accession #:    [id number]   Weight:       220.5 lb Date of Birth:  09/05/1955    BSA:          2.198 m Patient Age:    23 years     BP:           158/87 mmHg Patient Gender: M            HR:  91 bpm. Exam Location:  Inpatient Procedure: Transesophageal Echo Indications:    bacteremia  History:        Patient has prior history of Echocardiogram examinations, most                 recent 06/27/2019. CHF, COPD; Risk Factors:Hypertension.  Sonographer:    Jannett Celestine RDCS (AE) Referring Phys: 3 [place]: The transesophogeal probe was passed without difficulty through the esophogus of the patient. Sedation performed by different physician. The patient developed no complications during the procedure. IMPRESSIONS  1. Left ventricular ejection fraction, by estimation, is 60 to 65%. The left ventricle has normal function. The left ventricle has no regional wall motion abnormalities.  2. Right ventricular systolic function is normal. The right ventricular size is normal.  3. No left atrial/left atrial appendage thrombus was detected.  4. The mitral valve is grossly normal.  Mild to moderate mitral valve regurgitation. No evidence of mitral stenosis.  5. The aortic valve is normal in structure. Aortic valve regurgitation is trivial. No aortic stenosis is present. FINDINGS  Left Ventricle: Left ventricular ejection fraction, by estimation, is 60 to 65%. The left ventricle has normal function. The left ventricle has no regional wall motion abnormalities. The left ventricular internal cavity size was normal in size. There is  no left ventricular hypertrophy. Right Ventricle: The right ventricular size is normal. No increase in right ventricular wall thickness. Right ventricular systolic function is normal. Left Atrium: Left atrial size was normal in size. No left atrial/left atrial appendage thrombus was detected. Right Atrium: Right atrial size was normal in size. Pericardium: There is no evidence of pericardial effusion. Mitral Valve: The mitral valve is grossly normal. Mild to moderate mitral valve regurgitation. No evidence of mitral valve stenosis. There is no evidence of mitral valve vegetation. Tricuspid Valve: The tricuspid valve is grossly normal. Tricuspid valve regurgitation is mild . No evidence of tricuspid stenosis. Aortic Valve: The aortic valve is normal in structure. Aortic valve regurgitation is trivial. No aortic stenosis is present. There is no evidence of aortic valve vegetation. Pulmonic Valve: The pulmonic valve was grossly normal. Pulmonic valve regurgitation is mild. No evidence of pulmonic stenosis. Aorta: The aortic root and ascending aorta are structurally normal, with no evidence of dilitation. IAS/Shunts: The atrial septum is grossly normal. [name] MD Electronically signed by [name] MD Signature Date/Time: 06/29/2019/3:37:18 PM    Final    [place] EKG SITE RITE  Result Date: 06/30/2019 If Site Rite image not attached, placement could not be confirmed due to current cardiac rhythm.    Past medical hx Past Medical History:  Diagnosis Date  .  Anxiety    situational  . BPH (benign prostatic hyperplasia)    on flomax  . Bundle branch block    per prior PCP records  . CHF (congestive heart failure) ([place])   . COPD (chronic obstructive pulmonary disease) ([place])   . Diverticulosis    by colonoscopy  . Eczema    per prior PCP records  . Elevated PSA 2015   peaked 6s, s/p benign biopsy 2014, sees urology [name] ([place])  . Essential tremor   . GERD (gastroesophageal reflux disease)    per prior PCP records  . History of panic attacks    per prior PCP records  . HTN (hypertension)   . Mild intermittent asthma in adult without complication   . Obesity, Class I, BMI 30-34.9   . Osteoporosis  DEXA 06/2013 with osteopenia - h/o ?wrist/hip fracture from AVN from chronic steroid use (asthma), took reclast for 1 year  . Renal artery stenosis in 1 of 2 vessels ([place]) 2011   by [place]  . Seasonal allergies   . Sleep apnea   . Thoracic scoliosis childhood  . Transaminitis    per prior PCP records     Social History   Tobacco Use  . Smoking status: Never Smoker  . Smokeless tobacco: Never Used  Vaping Use  . Vaping Use: Never used  Substance Use Topics  . Alcohol use: Yes    Alcohol/week: 14.0 standard drinks    Types: 14 Glasses of wine per week    Comment: Social  . Drug use: No    Mr.Baar reports that he has never smoked. He has never used smokeless tobacco. He reports current alcohol use of about 14.0 standard drinks of alcohol per week. He reports that he does not use drugs.  Tobacco Cessation: Never smoker  Past surgical hx, Family hx, Social hx all reviewed.  Current Outpatient Medications on File Prior to Visit  Medication Sig  . ALPRAZolam (XANAX) 0.5 MG tablet Take 1 tablet (0.5 mg total) by mouth daily as needed (essential tremor).  . Amino Acids-Protein Hydrolys (FEEDING SUPPLEMENT, PRO-STAT SUGAR FREE 64,) LIQD Take 30 mLs by mouth 3 (three) times daily with meals.  [name] amLODipine (NORVASC) 5 MG tablet  TAKE 1 TABLET(5 MG) BY MOUTH DAILY (Patient taking differently: Take 5 mg by mouth daily. )  . cyanocobalamin 1000 MCG tablet Take 1 tablet (1,000 mcg total) by mouth daily. Available over the counter  . docusate sodium (COLACE) 100 MG capsule Take 1 capsule (100 mg total) by mouth 2 (two) times daily as needed for mild constipation. Available over the counter  . folic acid (FOLVITE) 1 MG tablet Take 1 tablet (1 mg total) by mouth daily.  [name] gabapentin (NEURONTIN) 300 MG capsule Take 1 capsule (300 mg total) by mouth 2 (two) times daily.  [name] losartan (COZAAR) 100 MG tablet Take 1 tablet (100 mg total) by mouth daily.  . magnesium oxide (MAG-OX) 400 (241.3 Mg) MG tablet Take 1 tablet (400 mg total) by mouth with breakfast, with lunch, and with evening meal.  . metoprolol succinate (TOPROL-XL) 50 MG 24 hr tablet Take 1 tablet (50 mg total) by mouth daily. Take with a meal.  . Multiple Vitamin (MULTIVITAMIN WITH MINERALS) TABS tablet Take 1 tablet by mouth daily.  [name] omeprazole (PRILOSEC) 20 MG capsule Take 20 mg by mouth daily.  [name] thiamine 100 MG tablet Take 1 tablet (100 mg total) by mouth daily. Available over the counter   No current facility-administered medications on file prior to visit.     Allergies  Allergen Reactions  . Accupril [Quinapril Hcl] Cough  . Nsaids     Perforated gastric ulcer    Review Of Systems:  Constitutional:   +   weight loss ( due to hospitalization) , No night sweats,  Fevers, chills, fatigue, or  lassitude.  HEENT:   No headaches,  Difficulty swallowing,  Tooth/dental problems, or  Sore throat,                No sneezing, itching, ear ache, nasal congestion, post nasal drip,   CV:  No chest pain,  Orthopnea, PND, swelling in lower extremities, anasarca, dizziness, palpitations, syncope.   GI  No heartburn, indigestion, abdominal pain, nausea, vomiting, diarrhea, change in bowel habits, loss of appetite,  bloody stools.   Resp: No shortness of breath with  exertion or at rest.  No excess mucus, no productive cough,  No non-productive cough,  No coughing up of blood.  No change in color of mucus.  No wheezing.  No chest wall deformity  Skin: no rash or lesions.  GU: no dysuria, change in color of urine, no urgency or frequency.  No flank pain, no hematuria   MS:  No joint pain or swelling.  No decreased range of motion.  No back pain.  Psych:  No change in mood or affect. No depression or anxiety.  No memory loss.   Vital Signs BP 110/70 (BP Location: Left Arm, Cuff Size: Normal)   Pulse 78   Temp (!) 97.3 F (36.3 C) (Oral)   Ht 5' 11.5" (1.816 m)   Wt 213 lb (96.6 kg)   SpO2 99%   BMI 29.29 kg/m    Physical Exam:  General- No distress,  A&Ox3, pleasant ENT: No sinus tenderness, TM clear, pale nasal mucosa, no oral exudate,no post nasal drip, no LAN Cardiac: S1, S2, regular rate and rhythm, no murmur Chest: No wheeze/ rales/ dullness; no accessory muscle use, no nasal flaring, no sternal retractions, significant scoliosis noted resulting in diminished breath sounds Abd.: Soft Non-tender, ND, BS + Ext: No clubbing cyanosis, edema Neuro:  normal strength, MAE x 4, A&O x 3 Skin: No rashes,No lesions,  warm and dry Psych: normal mood and behavior   Assessment/Plan Enterococcal bacteremia and UTI Plan Treated with linezolid  for 14 days (6/28-7/11). Plan Follow up with ID Call ID or PCP for new fever, weakness of confusion  Healthcare associated pneumonia Acute hypoxemic hypercapnic respiratory failure History of COPD, scoliosis -Patient had hypercapnic respiratory arrest on 6/30. -Underwent bronchoscopy on 6/30 -Bronchoalveolar lavage showed normal flora only.  Plan Monitor  Continue on CPAP at bedtime. You appear to be benefiting from the treatment  Goal is to wear for at least 6 hours each night for maximal clinical benefit. Continue to work on weight loss, as the link between excess weight  and sleep apnea is well  established.   Remember to establish a good bedtime routine, and work on sleep hygiene.  Limit daytime naps , avoid stimulants such as caffeine and nicotine close to bedtime, exercise daily to promote sleep quality, avoid heavy , spicy, fried , or rich foods before bed. Ensure adequate exposure to natural light during the day,establish a relaxing bedtime routine with a pleasant sleep environment ( Bedroom between 60 and 67 degrees, turn off bright lights , TV or device screens screens , consider black out curtains or white noise machines) Do not drive if sleepy. Remember to clean mask, tubing, filter, and reservoir once weekly with soapy water.  Follow up with [name] or Judson Roch NP with down Load     I offered a CPAP titration to the patient and his wife. They had concerns the CPAP was adequate . We will do a down Load in 1 month and reassess, so we can evaluate for mask leak etc.   This appointment was 40 min long with over 50% of the time in direct face-to-face patient care, assessment, plan of care, and follow-up.    Magdalen Spatz, NP 07/20/2019  3:27 PM

## 2019-07-20 NOTE — Patient Instructions (Addendum)
It is good to see you today. I am so glad you are doing so much better. Follow up with PCP as you have scheduled. Let me know if you change your mind about getting a CPAP titration. Outpatient rehab as is ordered. Please follow up with Dr Claiborne Billings as soon as possible to re-evaluate need for diuretic.  Continue on CPAP at bedtime. You appear to be benefiting from the treatment  Goal is to wear for at least 6 hours each night for maximal clinical benefit. Continue to work on weight loss, as the link between excess weight  and sleep apnea is well established.   Remember to establish a good bedtime routine, and work on sleep hygiene.  Limit daytime naps , avoid stimulants such as caffeine and nicotine close to bedtime, exercise daily to promote sleep quality, avoid heavy , spicy, fried , or rich foods before bed. Ensure adequate exposure to natural light during the day,establish a relaxing bedtime routine with a pleasant sleep environment ( Bedroom between 60 and 67 degrees, turn off bright lights , TV or device screens screens , consider black out curtains or white noise machines) Do not drive if sleepy. Remember to clean mask, tubing, filter, and reservoir once weekly with soapy water.  Follow up with [name] or Judson Roch NP   In 1 month with Down Load or before as needed.

## 2019-07-20 NOTE — Telephone Encounter (Signed)
Spoke with patient's wife. He is aware of appointment scheduled 7/26 10:00 with Dr Baxter Flattery for hospital follow up. Landis Gandy, RN

## 2019-07-20 NOTE — Telephone Encounter (Signed)
-----   Message from Truman Hayward, MD sent at 07/19/2019 11:46 AM EDT ----- Regarding: RE: I thought I had scheduled him? I'd not can also just get blood cultures 2 weeks post abx having been stopped ----- Message ----- From: Charlett Blake, MD Sent: 07/16/2019   7:36 AM EDT To: Truman Hayward, MD  Spoke with wife, Margaretha Glassing MD, she indicated that repeat [facility] were recommended, Pt is d/c today, do you need to see pt as OP?  [employer]

## 2019-07-21 ENCOUNTER — Telehealth: Payer: Self-pay | Admitting: Cardiovascular Disease

## 2019-07-21 ENCOUNTER — Encounter: Payer: Self-pay | Admitting: Family Medicine

## 2019-07-21 ENCOUNTER — Ambulatory Visit (INDEPENDENT_AMBULATORY_CARE_PROVIDER_SITE_OTHER): Payer: 59 | Admitting: Family Medicine

## 2019-07-21 VITALS — BP 127/70 | HR 78 | Temp 98.4°F | Ht 71.5 in | Wt 213.0 lb

## 2019-07-21 DIAGNOSIS — D539 Nutritional anemia, unspecified: Secondary | ICD-10-CM | POA: Diagnosis not present

## 2019-07-21 DIAGNOSIS — M818 Other osteoporosis without current pathological fracture: Secondary | ICD-10-CM

## 2019-07-21 DIAGNOSIS — B952 Enterococcus as the cause of diseases classified elsewhere: Secondary | ICD-10-CM

## 2019-07-21 DIAGNOSIS — G629 Polyneuropathy, unspecified: Secondary | ICD-10-CM | POA: Diagnosis not present

## 2019-07-21 DIAGNOSIS — R7881 Bacteremia: Secondary | ICD-10-CM | POA: Diagnosis not present

## 2019-07-21 DIAGNOSIS — R7989 Other specified abnormal findings of blood chemistry: Secondary | ICD-10-CM

## 2019-07-21 DIAGNOSIS — E440 Moderate protein-calorie malnutrition: Secondary | ICD-10-CM

## 2019-07-21 NOTE — Telephone Encounter (Signed)
[name] wife, [name], is requesting to schedule patient for hospital follow up with Dr. Claiborne Billings within 1-2 weeks. However, Dr. Evette Georges next appointment available in office is on 11/18/19. [name] insisted that patient must be worked into Dr. Evette Georges schedule within 1-2 weeks. Made her aware that I would forward her request to the nurse. Please assist.

## 2019-07-21 NOTE — Telephone Encounter (Signed)
Will route message over to Dr.Kelly's primary nurse.

## 2019-07-21 NOTE — Progress Notes (Signed)
7/20/20214:40 PM  Louis [name] December 28, 1955, 64 y.o., male [id number]  Chief Complaint  Patient presents with  . Hospitalization Follow-up    currently undergoing outpatient rehab - labs recheck mag, ferr, hemoglobin  . Referral    nutritionist requested  . bone scan    6/24 CT Scan of the spine recommended nuclear bone scan     HPI:   Patient is a 64 y.o. male with past medical history significant for familial tremors, restrictive lung disease with scoliosis, perforated duodenal ulcer, hepatic steatosis, moderate to excessive alcohol use, left Lisfranc fracture with dislocations s/p ORIF with onset of neuropathy, B12 deficiency, gait disorder with history of falls who presents today for hosp followup  hosp 6/24-7/16 2021 [facility]  Discharge Diagnoses:  Principal Problem:   Debility Active Problems:   Essential tremor   GERD (gastroesophageal reflux disease)   Hypomagnesemia   Macrocytic anemia   Bacteremia - enterococcus   Neuropathy  Overall doing much better Here with his wife Using rollator walker as needed Started outpatient PT yesterday Sees ID in 2 weeks for repeat blood cx Still taking b12 and thiamine - helping with his neuropathy and tremor Requesting referral to nutritionist for protein deficient malnutrition He is taking daily boost  H/o osteoporosis, s/p reclast twice  Long exposure to steroids (asthma and eczema) Last dexa in 2015  Has reached out to Dr Claiborne Billings, cards to make appt Seeing dr duda for right foot fracture  Depression screen [facility] 2/9 07/21/2019 11/18/2018 11/26/2017  Decreased Interest 0 0 0  Down, Depressed, Hopeless 0 0 0  PHQ - 2 Score 0 0 0  Altered sleeping - - 0  Tired, decreased energy - - 0  Change in appetite - - 0  Feeling bad or failure about yourself  - - 0  Trouble concentrating - - 0  Moving slowly or fidgety/restless - - 0  Suicidal thoughts - - 0  PHQ-9 Score - - 0  Difficult doing work/chores - - Not  difficult at all    Fall Risk  07/21/2019 11/18/2018 11/07/2018 08/14/2017  Falls in the past year? 1 1 1  No  Number falls in past yr: 1 0 0 -  Injury with Fall? 0 1 1 -  Risk for fall due to : - - Impaired balance/gait Impaired vision  Follow up Falls evaluation completed - Falls evaluation completed -     Allergies  Allergen Reactions  . Accupril [Quinapril Hcl] Cough  . Nsaids     Perforated gastric ulcer    Prior to Admission medications   Medication Sig Start Date End Date Taking? Authorizing Provider  ALPRAZolam Duanne Moron) 0.5 MG tablet Take 1 tablet (0.5 mg total) by mouth daily as needed (essential tremor). 07/17/19  Yes Love, Ivan Anchors, PA-C  Amino Acids-Protein Hydrolys (FEEDING SUPPLEMENT, PRO-STAT SUGAR FREE 64,) LIQD Take 30 mLs by mouth 3 (three) times daily with meals. 07/17/19  Yes Love, Ivan Anchors, PA-C  amLODipine (NORVASC) 5 MG tablet TAKE 1 TABLET(5 MG) BY MOUTH DAILY Patient taking differently: Take 5 mg by mouth daily.  12/22/18  Yes Troy Sine, MD  cyanocobalamin 1000 MCG tablet Take 1 tablet (1,000 mcg total) by mouth daily. Available over the counter 07/17/19  Yes Love, Ivan Anchors, PA-C  folic acid (FOLVITE) 1 MG tablet Take 1 tablet (1 mg total) by mouth daily. 07/08/19  Yes Dahal, Marlowe Aschoff, MD  gabapentin (NEURONTIN) 300 MG capsule Take 1 capsule (300 mg total) by  mouth 2 (two) times daily. 07/17/19  Yes Love, Ivan Anchors, PA-C  losartan (COZAAR) 100 MG tablet Take 1 tablet (100 mg total) by mouth daily. 07/17/19  Yes Love, Ivan Anchors, PA-C  magnesium oxide (MAG-OX) 400 (241.3 Mg) MG tablet Take 1 tablet (400 mg total) by mouth with breakfast, with lunch, and with evening meal. 07/17/19  Yes Love, Ivan Anchors, PA-C  metoprolol succinate (TOPROL-XL) 50 MG 24 hr tablet Take 1 tablet (50 mg total) by mouth daily. Take with a meal. 07/17/19  Yes Love, Ivan Anchors, PA-C  Multiple Vitamin (MULTIVITAMIN WITH MINERALS) TABS tablet Take 1 tablet by mouth daily. 07/08/19  Yes Dahal, Marlowe Aschoff, MD    omeprazole (PRILOSEC) 20 MG capsule Take 20 mg by mouth daily.   Yes [provider]  thiamine 100 MG tablet Take 1 tablet (100 mg total) by mouth daily. Available over the counter 07/17/19  Yes Love, Ivan Anchors, PA-C  docusate sodium (COLACE) 100 MG capsule Take 1 capsule (100 mg total) by mouth 2 (two) times daily as needed for mild constipation. Available over the counter Patient not taking: Reported on 07/21/2019 07/17/19   Love, Pecolia Ades    Past Medical History:  Diagnosis Date  . Anxiety    situational  . BPH (benign prostatic hyperplasia)    on flomax  . Bundle branch block    per prior PCP records  . CHF (congestive heart failure) ([place])   . COPD (chronic obstructive pulmonary disease) ([place])   . Diverticulosis    by colonoscopy  . Eczema    per prior PCP records  . Elevated PSA 2015   peaked 6s, s/p benign biopsy 2014, sees urology [name] ([place])  . Essential tremor   . GERD (gastroesophageal reflux disease)    per prior PCP records  . History of panic attacks    per prior PCP records  . HTN (hypertension)   . Mild intermittent asthma in adult without complication   . Obesity, Class I, BMI 30-34.9   . Osteoporosis    DEXA 06/2013 with osteopenia - h/o ?wrist/hip fracture from AVN from chronic steroid use (asthma), took reclast for 1 year  . Renal artery stenosis in 1 of 2 vessels ([place]) 2011   by [place]  . Seasonal allergies   . Sleep apnea   . Thoracic scoliosis childhood  . Transaminitis    per prior PCP records    Past Surgical History:  Procedure Laterality Date  . APPLICATION OF WOUND VAC Left 05/09/2018   Procedure: Application Of Wound Vac;  Surgeon: Newt Minion, MD;  Location: [place];  Service: Orthopedics;  Laterality: Left;  . BIOPSY  04/30/2019   Procedure: BIOPSY;  Surgeon: Juanita Craver, MD;  Location: [facility];  Service: Endoscopy;;  . COLONOSCOPY  12/2013   polyps, int hem, diverticulosis, rpt 5 yrs ([place])  . COLONOSCOPY WITH  PROPOFOL N/A 04/30/2019   Procedure: COLONOSCOPY WITH PROPOFOL;  Surgeon: Juanita Craver, MD;  Location: [facility];  Service: Endoscopy;  Laterality: N/A;  . ELBOW SURGERY Right 2008   golfer's elbow  . ESOPHAGOGASTRODUODENOSCOPY (EGD) WITH PROPOFOL N/A 04/30/2019   Procedure: ESOPHAGOGASTRODUODENOSCOPY (EGD) WITH PROPOFOL;  Surgeon: Juanita Craver, MD;  Location: [facility];  Service: Endoscopy;  Laterality: N/A;  . INGUINAL HERNIA REPAIR Bilateral childhood & ~1995  . LAPAROTOMY N/A 01/05/2019   Procedure: EXPLORATORY LAPAROTOMY graham patch repair;  Surgeon: Benjamine Sprague, DO;  Location: [facility];  Service: General;  Laterality: N/A;  . NASAL SEPTUM  SURGERY  2013   deviated septum  . ORIF ANKLE FRACTURE Left 05/09/2018   Procedure: OPEN REDUCTION INTERNAL FIXATION LEFT LISFRANC FRACTURE/DISLOCATION;  Surgeon: Newt Minion, MD;  Location: [place];  Service: Orthopedics;  Laterality: Left;  . POLYPECTOMY  04/30/2019   Procedure: POLYPECTOMY;  Surgeon: Juanita Craver, MD;  Location: [facility];  Service: Endoscopy;;  . TEE WITHOUT CARDIOVERSION N/A 06/29/2019   Procedure: TRANSESOPHAGEAL ECHOCARDIOGRAM (TEE);  Surgeon: Acie Fredrickson Wonda Cheng, MD;  Location: [facility];  Service: Cardiovascular;  Laterality: N/A;  . US ECHOCARDIOGRAPHY  02/2009   WNL, EF >55% Claiborne Billings)  . US RENAL/AORTA Right 05/2009   1-59% diameter reduction renal artery, rec rpt 2 yrs    Social History   Tobacco Use  . Smoking status: Never Smoker  . Smokeless tobacco: Never Used  Substance Use Topics  . Alcohol use: Yes    Alcohol/week: 14.0 standard drinks    Types: 14 Glasses of wine per week    Comment: Social    Family History  Problem Relation Age of Onset  . Cancer Father 35       colon  . Prostate cancer Father   . Diabetes Mother   . Alcohol abuse Mother   . Stroke Mother   . Cancer Maternal Grandmother        pancreatic  . Ataxia Brother   . Ataxia Sister   . CAD Neg Hx     Review of Systems   Constitutional: Negative for chills and fever.  Respiratory: Negative for cough and shortness of breath.   Cardiovascular: Positive for leg swelling. Negative for chest pain and palpitations.  Gastrointestinal: Negative for abdominal pain, nausea and vomiting.  per hpi   OBJECTIVE:  Today's Vitals   07/21/19 1616  BP: 127/70  Pulse: 78  Temp: 98.4 F (36.9 C)  SpO2: 96%  Weight: 213 lb (96.6 kg)  Height: 5' 11.5" (1.816 m)   Body mass index is 29.29 kg/m.   Physical Exam Vitals and nursing note reviewed.  Constitutional:      Appearance: He is well-developed.  HENT:     Head: Normocephalic and atraumatic.  Eyes:     Conjunctiva/sclera: Conjunctivae normal.     Pupils: Pupils are equal, round, and reactive to light.  Cardiovascular:     Rate and Rhythm: Normal rate and regular rhythm.     Heart sounds: No murmur heard.  No friction rub. No gallop.   Pulmonary:     Effort: Pulmonary effort is normal.     Breath sounds: Normal breath sounds. No wheezing or rales.  Musculoskeletal:     Cervical back: Neck supple.     Right lower leg: Edema present.     Left lower leg: No edema.  Skin:    General: Skin is warm and dry.  Neurological:     Mental Status: He is alert and oriented to person, place, and time.     No results found for this or any previous visit (from the past 24 hour(s)).  No results found.   ASSESSMENT and PLAN  1. Macrocytic anemia - CBC - Vitamin B12 - Folate - Vitamin B1  2. Neuropathy Improved with b supplementation  3. Hypomagnesemia - Magnesium  4. Enterococcal bacteremia Completed abx, managed by ID  5. Moderate protein-calorie malnutrition (HCC) - Comprehensive metabolic panel - Amb ref to Medical Nutrition Therapy-MNT  6. Elevated ferritin - Ferritin - thought to be acute phase reactant  7. Other osteoporosis without current pathological  fracture - DG Bone Density; Future  No follow-ups on file.    Rutherford Guys, MD [facility] [place], [place] [address] Ph.  [phone] Fax [phone]

## 2019-07-21 NOTE — Patient Instructions (Signed)
° ° ° °  If you have lab work done today you will be contacted with your lab results within the next 2 weeks.  If you have not heard from us then please contact us. The fastest way to get your results is to register for My Chart. ° ° °IF you received an x-ray today, you will receive an invoice from [facility]. Please contact [facility] at [phone] with questions or concerns regarding your invoice.  ° °IF you received labwork today, you will receive an invoice from [facility]. Please contact [facility] at [phone] with questions or concerns regarding your invoice.  ° °Our billing staff will not be able to assist you with questions regarding bills from these companies. ° °You will be contacted with the lab results as soon as they are available. The fastest way to get your results is to activate your My Chart account. Instructions are located on the last page of this paperwork. If you have not heard from us regarding the results in 2 weeks, please contact this office. °  ° ° ° °

## 2019-07-23 ENCOUNTER — Ambulatory Visit: Payer: 59 | Admitting: Occupational Therapy

## 2019-07-23 ENCOUNTER — Other Ambulatory Visit: Payer: Self-pay

## 2019-07-23 ENCOUNTER — Telehealth: Payer: Self-pay | Admitting: Family Medicine

## 2019-07-23 DIAGNOSIS — M818 Other osteoporosis without current pathological fracture: Secondary | ICD-10-CM

## 2019-07-23 DIAGNOSIS — E440 Moderate protein-calorie malnutrition: Secondary | ICD-10-CM

## 2019-07-23 DIAGNOSIS — I10 Essential (primary) hypertension: Secondary | ICD-10-CM

## 2019-07-23 NOTE — Telephone Encounter (Signed)
[name] wife returning call. 

## 2019-07-23 NOTE — Telephone Encounter (Signed)
[facility] 7/22

## 2019-07-23 NOTE — Telephone Encounter (Signed)
Patient is calling back and does not want to go to referral we have given him because they are not willing to verify insurance/ pt is unable to get through to his own insurance to verify coverage , He wants to go to  [facility] / [phone]  In [place]  off of [address] .

## 2019-07-23 NOTE — Telephone Encounter (Signed)
Pt would like to go to a different nutrisinist due to the current offices inability to verify insurance? States he would like to go to [facility] on [address] in [place] please advise

## 2019-07-23 NOTE — Telephone Encounter (Signed)
Called and spoke with pt's wife per DPR, able to schedule pt on sleep clinic day 07/29/19 at 11:40am. Pt wife states her husband just spent 3 weeks in the hospital and was advised by his other doctors to see dr. Claiborne Billings to discuss his diuretic for his CHF. Wife verbalized understanding with appt and had no other questions at this time. Will send to Dr.Kelly to make aware.

## 2019-07-24 ENCOUNTER — Ambulatory Visit (INDEPENDENT_AMBULATORY_CARE_PROVIDER_SITE_OTHER): Payer: 59 | Admitting: Physician Assistant

## 2019-07-24 ENCOUNTER — Ambulatory Visit (INDEPENDENT_AMBULATORY_CARE_PROVIDER_SITE_OTHER): Payer: 59

## 2019-07-24 ENCOUNTER — Encounter: Payer: Self-pay | Admitting: Physician Assistant

## 2019-07-24 VITALS — Ht 71.0 in | Wt 213.0 lb

## 2019-07-24 DIAGNOSIS — M79671 Pain in right foot: Secondary | ICD-10-CM

## 2019-07-24 NOTE — Addendum Note (Signed)
Addended by: Georgette Dover on: 07/24/2019 03:10 PM   Modules accepted: Level of Service

## 2019-07-24 NOTE — Progress Notes (Signed)
Office Visit Note   Patient: Louis [name]           Date of Birth: [birth year]           MRN: [id number] Visit Date: 07/24/2019              Requested by: Rutherford Guys, MD [address] [place],  [place] [address] PCP: Rutherford Guys, MD  Chief Complaint  Patient presents with  . Right Foot - Follow-up    DOI 06/14/19       HPI: This is a pleasant gentleman who is approximately 5 to 6 weeks status post right base of fifth metatarsal fracture.  He was recently hospitalized for an infection.  He has spent 2 weeks in inpatient rehab and is now home with his wife.  He states his foot is actually feeling quite well and he has been exercising.  He would like to return to a Pilates class using straps on his feet if he is allowed  Assessment & Plan: Visit Diagnoses:  1. Pain in right foot     Plan: Patient should just update pain and swelling be his guide I am fine with him wearing tennis shoes and trying to use the straps if it causes any pain he knows to modify this.  He may follow-up as needed  Follow-Up Instructions: No follow-ups on file.   Ortho Exam  Patient is alert, oriented, no adenopathy, well-dressed, normal affect, normal respiratory effort. Focused examination of his foot no swelling no erythema.  He has minimal tenderness even to deep palpation over the fracture site he ambulates with his normal gait  Imaging: No results found. No images are attached to the encounter.  Labs: Lab Results  Component Value Date   HGBA1C 5.7 (H) 06/26/2019   REPTSTATUS 07/04/2019 FINAL 07/01/2019   GRAMSTAIN NO WBC SEEN NO ORGANISMS SEEN  07/01/2019   CULT  07/01/2019    Consistent with normal respiratory flora. Performed at [facility], [place] [address], [place], [place] [address]    LABORGA ENTEROCOCCUS FAECALIS 06/25/2019     Lab Results  Component Value Date   ALBUMIN 3.9 07/21/2019   ALBUMIN 2.6 (L) 07/08/2019   ALBUMIN 2.9 (L) 07/04/2019   PREALBUMIN  5.5 (L) 06/30/2019    Lab Results  Component Value Date   MG 1.9 07/21/2019   MG 1.8 07/14/2019   MG 1.6 (L) 07/13/2019   Lab Results  Component Value Date   VD25OH 35.12 09/26/2015   VD25OH 67.1 10/11/2014   VD25OH 67.1 06/16/2013    Lab Results  Component Value Date   PREALBUMIN 5.5 (L) 06/30/2019   CBC EXTENDED Latest Ref Rng & Units 07/13/2019 07/08/2019 07/07/2019  WBC 4.0 - 10.5 K/uL 4.2 5.9 5.8  RBC 4.22 - 5.81 MIL/uL 2.81(L) 2.86(L) 2.61(L)  HGB 13.0 - 17.0 g/dL 9.3(L) 9.5(L) 8.7(L)  HCT 39 - 52 % 29.3(L) 29.3(L) 26.6(L)  PLT 150 - 400 K/uL 355 438(H) 385  NEUTROABS 1.7 - 7.7 K/uL - 3.3 3.2  LYMPHSABS 0.7 - 4.0 K/uL - 1.4 1.4     Body mass index is 29.71 kg/m.  Orders:  Orders Placed This Encounter  Procedures  . XR Foot 2 Views Right   No orders of the defined types were placed in this encounter.    Procedures: No procedures performed  Clinical Data: No additional findings.  ROS:  All other systems negative, except as noted in the HPI. Review of Systems  Objective:  Vital Signs: Ht 5\' 11"  (1.803 m)   Wt (!) 213 lb (96.6 kg)   BMI 29.71 kg/m   Specialty Comments:  No specialty comments available.  PMFS History: Patient Active Problem List   Diagnosis Date Noted  . Neuropathy 07/20/2019  . Hypomagnesemia   . Macrocytic anemia   . Bacteremia   . Debility 07/07/2019  . Acute respiratory failure with hypoxia ([place])   . Atelectasis   . Enterococcal bacteremia 06/26/2019  . AKI (acute kidney injury) ([place]) 06/25/2019  . Perforated viscus 01/06/2019  . Severe sepsis with septic shock ([place]) 01/06/2019  . Acute renal failure ([place]) 01/06/2019  . Hyperkalemia 01/06/2019  . Hyponatremia 01/06/2019  . Lactic acidosis 01/06/2019  . Onychomycosis 05/19/2018  . Foot fracture, left, closed, initial encounter 05/09/2018  . Lisfranc dislocation, left, initial encounter   . Acute bronchitis 02/25/2018  . Primary pulmonary hypertension ([place]) 07/09/2017    . Diastolic dysfunction 42/70/6237  . OSA (obstructive sleep apnea) 07/08/2017  . SOB (shortness of breath) 07/02/2017  . Chronic respiratory failure with hypoxia and hypercapnia ([place]) 07/02/2017  . Cardiomegaly 07/02/2017  . Exposure keratitis 02/17/2017  . Pedal edema 12/18/2016  . Transaminitis 12/18/2016  . Bilateral pes planus 12/18/2016  . Psoriasis-eczema overlap condition 07/30/2016  . High serum high density lipoprotein (HDL) 09/26/2015  . Epistaxis 09/26/2015  . Renal artery stenosis in 1 of 2 vessels ([place])   . Essential tremor   . Elevated PSA   . GERD (gastroesophageal reflux disease)   . Health maintenance examination 10/08/2014  . Anxiety state 10/08/2014  . Obesity, Class I, BMI 30-34.9   . Mild intermittent asthma in adult without complication   . Seasonal allergies   . HTN (hypertension)   . Scoliosis   . Osteoporosis    Past Medical History:  Diagnosis Date  . Anxiety    situational  . BPH (benign prostatic hyperplasia)    on flomax  . Bundle branch block    per prior PCP records  . CHF (congestive heart failure) ([place])   . COPD (chronic obstructive pulmonary disease) ([place])   . Diverticulosis    by colonoscopy  . Eczema    per prior PCP records  . Elevated PSA 2015   peaked 6s, s/p benign biopsy 2014, sees urology [name] ([place])  . Essential tremor   . GERD (gastroesophageal reflux disease)    per prior PCP records  . History of panic attacks    per prior PCP records  . HTN (hypertension)   . Mild intermittent asthma in adult without complication   . Obesity, Class I, BMI 30-34.9   . Osteoporosis    DEXA 06/2013 with osteopenia - h/o ?wrist/hip fracture from AVN from chronic steroid use (asthma), took reclast for 1 year  . Renal artery stenosis in 1 of 2 vessels ([place]) 2011   by [place]  . Seasonal allergies   . Sleep apnea   . Thoracic scoliosis childhood  . Transaminitis    per prior PCP records    Family History  Problem Relation Age of  Onset  . Cancer Father 82       colon  . Prostate cancer Father   . Diabetes Mother   . Alcohol abuse Mother   . Stroke Mother   . Cancer Maternal Grandmother        pancreatic  . Ataxia Brother   . Ataxia Sister   . CAD Neg Hx     Past Surgical History:  Procedure  Laterality Date  . APPLICATION OF WOUND VAC Left 05/09/2018   Procedure: Application Of Wound Vac;  Surgeon: Newt Minion, MD;  Location: [facility];  Service: Orthopedics;  Laterality: Left;  . BIOPSY  04/30/2019   Procedure: BIOPSY;  Surgeon: Juanita Craver, MD;  Location: [facility];  Service: Endoscopy;;  . COLONOSCOPY  12/2013   polyps, int hem, diverticulosis, rpt 5 yrs ([place])  . COLONOSCOPY WITH PROPOFOL N/A 04/30/2019   Procedure: COLONOSCOPY WITH PROPOFOL;  Surgeon: Juanita Craver, MD;  Location: [facility];  Service: Endoscopy;  Laterality: N/A;  . ELBOW SURGERY Right 2008   golfer's elbow  . ESOPHAGOGASTRODUODENOSCOPY (EGD) WITH PROPOFOL N/A 04/30/2019   Procedure: ESOPHAGOGASTRODUODENOSCOPY (EGD) WITH PROPOFOL;  Surgeon: Juanita Craver, MD;  Location: [facility];  Service: Endoscopy;  Laterality: N/A;  . INGUINAL HERNIA REPAIR Bilateral childhood & ~1995  . LAPAROTOMY N/A 01/05/2019   Procedure: EXPLORATORY LAPAROTOMY graham patch repair;  Surgeon: Benjamine Sprague, DO;  Location: [facility];  Service: General;  Laterality: N/A;  . NASAL SEPTUM SURGERY  2013   deviated septum  . ORIF ANKLE FRACTURE Left 05/09/2018   Procedure: OPEN REDUCTION INTERNAL FIXATION LEFT LISFRANC FRACTURE/DISLOCATION;  Surgeon: Newt Minion, MD;  Location: [facility];  Service: Orthopedics;  Laterality: Left;  . POLYPECTOMY  04/30/2019   Procedure: POLYPECTOMY;  Surgeon: Juanita Craver, MD;  Location: [facility];  Service: Endoscopy;;  . TEE WITHOUT CARDIOVERSION N/A 06/29/2019   Procedure: TRANSESOPHAGEAL ECHOCARDIOGRAM (TEE);  Surgeon: Acie Fredrickson Wonda Cheng, MD;  Location[facility];  Service: Cardiovascular;  Laterality: N/A;  . US ECHOCARDIOGRAPHY   02/2009   WNL, EF >55% Claiborne Billings)  . US RENAL/AORTA Right 05/2009   1-59% diameter reduction renal artery, rec rpt 2 yrs   Social History   Occupational History  . Not on file  Tobacco Use  . Smoking status: Never Smoker  . Smokeless tobacco: Never Used  Vaping Use  . Vaping Use: Never used  Substance and Sexual Activity  . Alcohol use: Yes    Alcohol/week: 14.0 standard drinks    Types: 14 Glasses of wine per week    Comment: Social  . Drug use: No  . Sexual activity: Yes    Birth control/protection: None

## 2019-07-25 LAB — VITAMIN B12: Vitamin B-12: 757 pg/mL (ref 232–1245)

## 2019-07-25 LAB — COMPREHENSIVE METABOLIC PANEL
ALT: 15 IU/L (ref 0–44)
AST: 16 IU/L (ref 0–40)
Albumin/Globulin Ratio: 1.3 (ref 1.2–2.2)
Albumin: 3.9 g/dL (ref 3.8–4.8)
Alkaline Phosphatase: 77 IU/L (ref 48–121)
BUN/Creatinine Ratio: 14 (ref 10–24)
BUN: 12 mg/dL (ref 8–27)
Bilirubin Total: 0.5 mg/dL (ref 0.0–1.2)
CO2: 25 mmol/L (ref 20–29)
Calcium: 9.2 mg/dL (ref 8.6–10.2)
Chloride: 99 mmol/L (ref 96–106)
Creatinine, Ser: 0.88 mg/dL (ref 0.76–1.27)
GFR calc Af Amer: 106 mL/min/{1.73_m2} (ref >59)
GFR calc non Af Amer: 92 mL/min/{1.73_m2} (ref >59)
Globulin, Total: 2.9 g/dL (ref 1.5–4.5)
Glucose: 96 mg/dL (ref 65–99)
Potassium: 4.4 mmol/L (ref 3.5–5.2)
Sodium: 135 mmol/L (ref 134–144)
Total Protein: 6.8 g/dL (ref 6.0–8.5)

## 2019-07-25 LAB — VITAMIN B1: Thiamine: 164.6 nmol/L (ref 66.5–200.0)

## 2019-07-25 LAB — FOLATE: Folate: 16.9 ng/mL (ref >3.0)

## 2019-07-25 LAB — MAGNESIUM: Magnesium: 1.9 mg/dL (ref 1.6–2.3)

## 2019-07-25 LAB — FERRITIN: Ferritin: 735 ng/mL — ABNORMAL HIGH (ref 30–400)

## 2019-07-27 ENCOUNTER — Encounter: Payer: Self-pay | Admitting: Internal Medicine

## 2019-07-27 ENCOUNTER — Ambulatory Visit (INDEPENDENT_AMBULATORY_CARE_PROVIDER_SITE_OTHER): Payer: 59 | Admitting: Internal Medicine

## 2019-07-27 ENCOUNTER — Other Ambulatory Visit: Payer: Self-pay

## 2019-07-27 ENCOUNTER — Ambulatory Visit: Payer: 59 | Attending: Physician Assistant | Admitting: Physical Therapy

## 2019-07-27 VITALS — BP 110/68 | HR 82 | Temp 98.0°F | Wt 215.0 lb

## 2019-07-27 DIAGNOSIS — M6281 Muscle weakness (generalized): Secondary | ICD-10-CM

## 2019-07-27 DIAGNOSIS — R2689 Other abnormalities of gait and mobility: Secondary | ICD-10-CM | POA: Insufficient documentation

## 2019-07-27 DIAGNOSIS — R2681 Unsteadiness on feet: Secondary | ICD-10-CM | POA: Diagnosis present

## 2019-07-27 DIAGNOSIS — R7881 Bacteremia: Secondary | ICD-10-CM | POA: Diagnosis not present

## 2019-07-27 DIAGNOSIS — B952 Enterococcus as the cause of diseases classified elsewhere: Secondary | ICD-10-CM

## 2019-07-27 NOTE — Progress Notes (Signed)
RFV: follow up for hospitalization for enterococcal bacteremia sepsis  [name], male   DOB: 1955/01/19, 64 y.o.   MRN: [id number]  HPI Louis [name] is a 64yo M with sepsis with enterococcal bacteremia who has finished iv abtx and doing well overall. Feels that he is getting closer back to his baseline. He has gained much function back from doing physical therapy. Denies fever, chills, nightsweats, urgency, flank pain.   Outpatient Encounter Medications as of 07/27/2019  Medication Sig  . ALPRAZolam (XANAX) 0.5 MG tablet Take 1 tablet (0.5 mg total) by mouth daily as needed (essential tremor).  . Amino Acids-Protein Hydrolys (FEEDING SUPPLEMENT, PRO-STAT SUGAR FREE 64,) LIQD Take 30 mLs by mouth 3 (three) times daily with meals.  [name] amLODipine (NORVASC) 5 MG tablet TAKE 1 TABLET(5 MG) BY MOUTH DAILY (Patient taking differently: Take 5 mg by mouth daily. )  . cyanocobalamin 1000 MCG tablet Take 1 tablet (1,000 mcg total) by mouth daily. Available over the counter  . docusate sodium (COLACE) 100 MG capsule Take 1 capsule (100 mg total) by mouth 2 (two) times daily as needed for mild constipation. Available over the counter  . folic acid (FOLVITE) 1 MG tablet Take 1 tablet (1 mg total) by mouth daily.  [name] gabapentin (NEURONTIN) 300 MG capsule Take 1 capsule (300 mg total) by mouth 2 (two) times daily.  [name] losartan (COZAAR) 100 MG tablet Take 1 tablet (100 mg total) by mouth daily.  . magnesium oxide (MAG-OX) 400 (241.3 Mg) MG tablet Take 1 tablet (400 mg total) by mouth with breakfast, with lunch, and with evening meal.  . metoprolol succinate (TOPROL-XL) 50 MG 24 hr tablet Take 1 tablet (50 mg total) by mouth daily. Take with a meal.  . Multiple Vitamin (MULTIVITAMIN WITH MINERALS) TABS tablet Take 1 tablet by mouth daily.  [name] omeprazole (PRILOSEC) 20 MG capsule Take 20 mg by mouth daily.  [name] thiamine 100 MG tablet Take 1 tablet (100 mg total) by mouth daily. Available over the  counter   No facility-administered encounter medications on file as of 07/27/2019.     Patient Active Problem List   Diagnosis Date Noted  . Neuropathy 07/20/2019  . Hypomagnesemia   . Macrocytic anemia   . Bacteremia   . Debility 07/07/2019  . Acute respiratory failure with hypoxia ([place])   . Atelectasis   . Enterococcal bacteremia 06/26/2019  . AKI (acute kidney injury) ([place]) 06/25/2019  . Perforated viscus 01/06/2019  . Severe sepsis with septic shock ([place]) 01/06/2019  . Acute renal failure ([place]) 01/06/2019  . Hyperkalemia 01/06/2019  . Hyponatremia 01/06/2019  . Lactic acidosis 01/06/2019  . Onychomycosis 05/19/2018  . Foot fracture, left, closed, initial encounter 05/09/2018  . Lisfranc dislocation, left, initial encounter   . Acute bronchitis 02/25/2018  . Primary pulmonary hypertension ([place]) 07/09/2017  . Diastolic dysfunction 69/67/8938  . OSA (obstructive sleep apnea) 07/08/2017  . SOB (shortness of breath) 07/02/2017  . Chronic respiratory failure with hypoxia and hypercapnia ([place]) 07/02/2017  . Cardiomegaly 07/02/2017  . Exposure keratitis 02/17/2017  . Pedal edema 12/18/2016  . Transaminitis 12/18/2016  . Bilateral pes planus 12/18/2016  . Psoriasis-eczema overlap condition 07/30/2016  . High serum high density lipoprotein (HDL) 09/26/2015  . Epistaxis 09/26/2015  . Renal artery stenosis in 1 of 2 vessels ([place])   . Essential tremor   . Elevated PSA   . GERD (gastroesophageal reflux disease)   . Health maintenance examination 10/08/2014  . Anxiety state  10/08/2014  . Obesity, Class I, BMI 30-34.9   . Mild intermittent asthma in adult without complication   . Seasonal allergies   . HTN (hypertension)   . Scoliosis   . Osteoporosis      There are no preventive care reminders to display for this patient.   Review of Systems 12 point ros is negative except what is mentioned above Physical Exam   BP 110/68   Pulse 82   Temp 98 F (36.7 C)   Wt (!)  215 lb (97.5 kg)   SpO2 97%   BMI 29.99 kg/m   gen = a xo by 4 in nad heent = MMM, op clear Cors= nl s1,s2 no g/m/r  CBC Lab Results  Component Value Date   WBC 4.2 07/13/2019   RBC 2.81 (L) 07/13/2019   HGB 9.3 (L) 07/13/2019   HCT 29.3 (L) 07/13/2019   PLT 355 07/13/2019   MCV 104.3 (H) 07/13/2019   MCH 33.1 07/13/2019   MCHC 31.7 07/13/2019   RDW 13.6 07/13/2019   LYMPHSABS 1.4 07/08/2019   MONOABS 0.9 07/08/2019   EOSABS 0.3 07/08/2019    BMET Lab Results  Component Value Date   NA 135 07/21/2019   K 4.4 07/21/2019   CL 99 07/21/2019   CO2 25 07/21/2019   GLUCOSE 96 07/21/2019   BUN 12 07/21/2019   CREATININE 0.88 07/21/2019   CALCIUM 9.2 07/21/2019   GFRNONAA 92 07/21/2019   GFRAA 106 07/21/2019    No results found for: ESRSEDRATE, POCTSEDRATE   Assessment and Plan History of enterococcal bacteremia = will plan to get  Repeat blood culture today to ensure he has resolved infection

## 2019-07-27 NOTE — Therapy (Signed)
[facility] [address] [place], [place], [address] Phone: [phone]   Fax:  [phone]  Physical Therapy Evaluation  Patient Details  Name: Finnbar Cedillos MRN: [id number] Date of Birth: 1955/12/07 Referring Provider (PT): Marlowe Shores, PA-C Grant Fontana, MD)   Encounter Date: 07/27/2019   PT End of Session - 07/27/19 0856    Visit Number 1    Number of Visits 10    Authorization Type [employer], covered 100% as ded and OOPM met; VL:  30    PT Start Time 0801    PT Stop Time 0846    PT Time Calculation (min) 45 min    Equipment Utilized During Treatment Gait belt    Activity Tolerance Patient tolerated treatment well    Behavior During Therapy WFL for tasks assessed/performed           Past Medical History:  Diagnosis Date  . Anxiety    situational  . BPH (benign prostatic hyperplasia)    on flomax  . Bundle branch block    per prior PCP records  . CHF (congestive heart failure) ([place])   . COPD (chronic obstructive pulmonary disease) ([place])   . Diverticulosis    by colonoscopy  . Eczema    per prior PCP records  . Elevated PSA 2015   peaked 6s, s/p benign biopsy 2014, sees urology [name] ([place])  . Essential tremor   . GERD (gastroesophageal reflux disease)    per prior PCP records  . History of panic attacks    per prior PCP records  . HTN (hypertension)   . Mild intermittent asthma in adult without complication   . Obesity, Class I, BMI 30-34.9   . Osteoporosis    DEXA 06/2013 with osteopenia - h/o ?wrist/hip fracture from AVN from chronic steroid use (asthma), took reclast for 1 year  . Renal artery stenosis in 1 of 2 vessels ([place]) 2011   by [place]  . Seasonal allergies   . Sleep apnea   . Thoracic scoliosis childhood  . Transaminitis    per prior PCP records    Past Surgical History:  Procedure Laterality Date  . APPLICATION OF WOUND VAC Left 05/09/2018   Procedure: Application Of  Wound Vac;  Surgeon: Newt Minion, MD;  Location: [facility];  Service: Orthopedics;  Laterality: Left;  . BIOPSY  04/30/2019   Procedure: BIOPSY;  Surgeon: Juanita Craver, MD;  Location: [facility];  Service: Endoscopy;;  . COLONOSCOPY  12/2013   polyps, int hem, diverticulosis, rpt 5 yrs ([place])  . COLONOSCOPY WITH PROPOFOL N/A 04/30/2019   Procedure: COLONOSCOPY WITH PROPOFOL;  Surgeon: Juanita Craver, MD;  Location: [facility];  Service: Endoscopy;  Laterality: N/A;  . ELBOW SURGERY Right 2008   golfer's elbow  . ESOPHAGOGASTRODUODENOSCOPY (EGD) WITH PROPOFOL N/A 04/30/2019   Procedure: ESOPHAGOGASTRODUODENOSCOPY (EGD) WITH PROPOFOL;  Surgeon: Juanita Craver, MD;  Location: [facility];  Service: Endoscopy;  Laterality: N/A;  . INGUINAL HERNIA REPAIR Bilateral childhood & ~1995  . LAPAROTOMY N/A 01/05/2019   Procedure: EXPLORATORY LAPAROTOMY graham patch repair;  Surgeon: Benjamine Sprague, DO;  Location: [facility];  Service: General;  Laterality: N/A;  . NASAL SEPTUM SURGERY  2013   deviated septum  . ORIF ANKLE FRACTURE Left 05/09/2018   Procedure: OPEN REDUCTION INTERNAL FIXATION LEFT LISFRANC FRACTURE/DISLOCATION;  Surgeon: Newt Minion, MD;  Location: [facility];  Service: Orthopedics;  Laterality: Left;  . POLYPECTOMY  04/30/2019   Procedure: POLYPECTOMY;  Surgeon: [name],  Mar Daring, MD;  Location: [facility];  Service: Endoscopy;;  . TEE WITHOUT CARDIOVERSION N/A 06/29/2019   Procedure: TRANSESOPHAGEAL ECHOCARDIOGRAM (TEE);  Surgeon: Acie Fredrickson Wonda Cheng, MD;  Location: [facility];  Service: Cardiovascular;  Laterality: N/A;  . US ECHOCARDIOGRAPHY  02/2009   WNL, EF >55% [facility])  . US RENAL/AORTA Right 05/2009   1-59% diameter reduction renal artery, rec rpt 2 yrs    There were no vitals filed for this visit.    Subjective Assessment - 07/27/19 0802    Subjective Things I'm working on at home are to get back leg strength and endurance and balance.  Have some exercises at home.  Work with Pilates to try  to work that back into my schedule.  Foot fracture is healing and I can do any exercise that doesn't give me pain.  Have been walking with rollator outside at home in mornings; up to about 1/2 mile.  No falls since home from hospitalization.    Pertinent History L foot fracture, sepsis with septic shock, acute renal railure, OSA, GERD, anxiety, HTN, restrictive lung disease with scoliosis, neuropathy    Patient Stated Goals Pt's goal for therapy are for strengthening and endurance.    Pain Score 0-No pain              [facility] PT Assessment - 07/27/19 0807      Assessment   Medical Diagnosis malaise, debility    Referring Provider (PT) Marlowe Shores, PA-C   Grant Fontana, MD   Onset Date/Surgical Date 06/25/19    Hand Dominance Right      Precautions   Precautions Fall    Precaution Comments Avoid activities that are painful for R foot (s/p fx)      Balance Screen   Has the patient fallen in the past 6 months Yes    How many times? 5-6   prior to hospitalization   Has the patient had a decrease in activity level because of a fear of falling?  No    Is the patient reluctant to leave their home because of a fear of falling?  No      Home Environment   Living Environment Private residence    Living Arrangements Spouse/significant other   Wife is home during summer   Available Help at Discharge Family    Type of [place] to enter    Entrance Stairs-Number of Steps 4    [place] Two level;Able to live on main level with bedroom/bathroom    Alternate Level Stairs-Number of Steps 15    Alternate Level Stairs-Rails Left;Right      Prior Function   Level of Independence Independent    Vocation Full time employment   [employer], plays golf; walking in the neighborhood.  Enjoys fly fishing      Observation/Other Assessments   Focus on Therapeutic Outcomes (FOTO)  NA      Sensation   Additional Comments  numbness plantar aspect of feet, due to neuropathy (reports better on gabapentin)      Posture/Postural Control   Posture/Postural Control Postural limitations    Posture Comments Hx of scoliosis      ROM / Strength   AROM / PROM / Strength Strength      Strength   Overall Strength Deficits    Strength Assessment Site Hip;Knee;Ankle    Right/Left Hip Right;Left    Right Hip Flexion 4/5  Left Hip Flexion 4/5    Right/Left Knee Right;Left    Right Knee Flexion 5/5    Right Knee Extension 4+/5    Left Knee Flexion 5/5    Left Knee Extension 4+/5    Right/Left Ankle Right;Left    Right Ankle Dorsiflexion 4/5    Left Ankle Dorsiflexion 4/5      Transfers   Transfers Sit to Stand;Stand to Sit    Sit to Stand Without upper extremity assist;From chair/3-in-1;5: Supervision    Five time sit to stand comments  29.87    Stand to Sit 5: Supervision;Without upper extremity assist;To chair/3-in-1    Comments Requires extra time between reps 3-5; widens BOS      Ambulation/Gait   Ambulation/Gait Yes    Ambulation/Gait Assistance 5: Supervision    Ambulation Distance (Feet) 580 Feet    Assistive device None    Gait Pattern Step-through pattern;Wide base of support    Ambulation Surface Level;Indoor    Gait velocity 10.4 sec = 3.15 ft/sec    Gait Comments 563 ft in 3 minutes walk test; O2 sats 98 and HR 85 pre-3 MWT; O2 95%, HR 105 bpm post 3 MWT      Standardized Balance Assessment   Standardized Balance Assessment Timed Up and Go Test;Dynamic Gait Index      Dynamic Gait Index   Level Surface Mild Impairment   6.78   Change in Gait Speed Normal    Gait with Horizontal Head Turns Mild Impairment    Gait with Vertical Head Turns Mild Impairment    Gait and Pivot Turn Normal    Step Over Obstacle Moderate Impairment    Step Around Obstacles Mild Impairment    Steps Mild Impairment    Total Score 17    DGI comment: Scores <19/24 indicate increased fall risk.      Timed Up and  Go Test   Normal TUG (seconds) 12.06    TUG Comments Scores >13.5 seconds indicate increased fall risk.      High Level Balance   High Level Balance Comments Pt stands 30 seconds EO, 24 sec EC solid surface.  Stands on foam 30 seconds EO, EC x 10 seconds with increased sway and min guard.                        Objective measurements completed on examination: See above findings.               PT Education - 07/27/19 0856    Education Details Eval results, POC    Person(s) Educated Patient    Methods Explanation    Comprehension Verbalized understanding               PT Long Term Goals - 07/27/19 0909      PT LONG TERM GOAL #1   Title Pt will be independent with HEP for improved strength, balance, transfers, and gait.  TARGET 08/28/2019 (may be extended 1 additional week, due to pt out of town)    Time 5    Period Weeks    Status New      PT LONG TERM GOAL #2   Title Pt will improve 5x sit<>stand to less than or equal to 20 sec to demonstrate improved functional strength and transfer efficiency.    Baseline 29.87 sec    Time 5    Period Weeks    Status New      PT LONG TERM  GOAL #3   Title Pt will improve DGI score to at least 20/24 to decrease fall risk.    Baseline 17/24    Time 5    Period Weeks    Status New      PT LONG TERM GOAL #4   Title Pt will improve distance ambulated in 3 MWT to at least 610 ft for improved gait efficiency with community gait.    Baseline 563 ft in 3 minutes    Time 5    Period Weeks    Status New      PT LONG TERM GOAL #5   Title Pt stand at least 30 seconds EC on foam, demonstrating improved vestibular input/use for balance.    Baseline 10 sec foam, EC with min guard and incr. sway    Time 5    Period Weeks    Status New      Additional Long Term Goals   Additional Long Term Goals Yes      PT LONG TERM GOAL #6   Title Pt will verbalize understanding of fall prevention in home environment.    Time 5     Period Weeks    Status New                  Plan - 07/27/19 0857    Clinical Impression Statement Patient is a 63 y.o. male with who was admitted to [facility] on [facility]/25/2019 with 2-week history of difficulty walking, confusion, shortness of breath and multiple falls.  He was found to have acute renal failure with hyperkalemia, hyponatremia; work-up done also revealed sepsis due to Enterococcus bacteremia and urine culture also positive for Enterococcus.  He was seen for rehab on CIR and d/c home 07/17/2019.  He presents to OPPT with decreased strength, decreased balance, decreased endurance for gait, decreased gait independence.  He is at fall risk per DGI score and demonstrated decreased vestibular system use for balance.  He uses no device at eval, but reports using rollator for long distance wlaking in community for exercise.  Prior to hospitalization, he was independent, working as Theme park manager and enjoys golf, Veterinary surgeon, and Pilates.  He would benefit from skilled OPPT to address the above stated deficits for return to independent functional mobility and decreased fall risk.    Personal Factors and Comorbidities Comorbidity 3+    Comorbidities history of familial tremors, restrictive lung disease with scoliosis, perforated duodenal ulcer, hepatic steatosis, moderate to excessive alcohol use, left Lisfranc fracture with dislocations s/p ORIF with onset of neuropathy, B12 deficiency, gait disorder with history of falls; R 5th metatarsal fx s/p fall 06/2019    Examination-Activity Limitations Locomotion Level;Transfers;Stand;Stairs    Examination-Participation Restrictions Church;Community Activity;Other   Fly fishing, golf, pilates   Stability/Clinical Decision Making Evolving/Moderate complexity    Clinical Decision Making Moderate    Rehab Potential Good    PT Frequency Other (comment)   1x/wk for 1 week, then 2x/wk for 4 weeks   PT Duration Other (comment)   5 weeks total POC   PT  Treatment/Interventions ADLs/Self Care Home Management;Gait training;Stair training;Functional mobility training;Therapeutic activities;Therapeutic exercise;Balance training;Neuromuscular re-education;Patient/family education    PT Next Visit Plan Initiate HEP for strength, balance, standing exercises at counter; corner balance exercises, sit<>stand from higher height surfaces without UE support    Consulted and Agree with Plan of Care Patient           Patient will benefit from skilled therapeutic intervention in order to improve the  following deficits and impairments:  Abnormal gait, Difficulty walking, Decreased endurance, Decreased activity tolerance, Decreased balance, Decreased mobility, Decreased strength, Postural dysfunction  Visit Diagnosis: Other abnormalities of gait and mobility  Unsteadiness on feet  Muscle weakness (generalized)     Problem List Patient Active Problem List   Diagnosis Date Noted  . Neuropathy 07/20/2019  . Hypomagnesemia   . Macrocytic anemia   . Bacteremia   . Debility 07/07/2019  . Acute respiratory failure with hypoxia ([place])   . Atelectasis   . Enterococcal bacteremia 06/26/2019  . AKI (acute kidney injury) ([place]) 06/25/2019  . Perforated viscus 01/06/2019  . Severe sepsis with septic shock ([place]) 01/06/2019  . Acute renal failure ([place]) 01/06/2019  . Hyperkalemia 01/06/2019  . Hyponatremia 01/06/2019  . Lactic acidosis 01/06/2019  . Onychomycosis 05/19/2018  . Foot fracture, left, closed, initial encounter 05/09/2018  . Lisfranc dislocation, left, initial encounter   . Acute bronchitis 02/25/2018  . Primary pulmonary hypertension ([place]) 07/09/2017  . Diastolic dysfunction 18/28/8337  . OSA (obstructive sleep apnea) 07/08/2017  . SOB (shortness of breath) 07/02/2017  . Chronic respiratory failure with hypoxia and hypercapnia ([place]) 07/02/2017  . Cardiomegaly 07/02/2017  . Exposure keratitis 02/17/2017  . Pedal edema 12/18/2016  .  Transaminitis 12/18/2016  . Bilateral pes planus 12/18/2016  . Psoriasis-eczema overlap condition 07/30/2016  . High serum high density lipoprotein (HDL) 09/26/2015  . Epistaxis 09/26/2015  . Renal artery stenosis in 1 of 2 vessels ([place])   . Essential tremor   . Elevated PSA   . GERD (gastroesophageal reflux disease)   . Health maintenance examination 10/08/2014  . Anxiety state 10/08/2014  . Obesity, Class I, BMI 30-34.9   . Mild intermittent asthma in adult without complication   . Seasonal allergies   . HTN (hypertension)   . Scoliosis   . Osteoporosis     Caniyah Murley W. 07/27/2019, 9:14 AM  Frazier Butt., PT   [facility] [address] [place], [place], [address] Phone: [phone]   Fax:  [phone]  Name: Yuchen Fedor MRN: [id number] Date of Birth: 02-18-1955

## 2019-07-29 ENCOUNTER — Encounter: Payer: Self-pay | Admitting: Family Medicine

## 2019-07-29 ENCOUNTER — Ambulatory Visit (INDEPENDENT_AMBULATORY_CARE_PROVIDER_SITE_OTHER): Payer: 59 | Admitting: Cardiovascular Disease

## 2019-07-29 ENCOUNTER — Other Ambulatory Visit: Payer: Self-pay

## 2019-07-29 ENCOUNTER — Encounter: Payer: Self-pay | Admitting: Cardiovascular Disease

## 2019-07-29 DIAGNOSIS — M7989 Other specified soft tissue disorders: Secondary | ICD-10-CM

## 2019-07-29 DIAGNOSIS — M4124 Other idiopathic scoliosis, thoracic region: Secondary | ICD-10-CM

## 2019-07-29 DIAGNOSIS — A4181 Sepsis due to Enterococcus: Secondary | ICD-10-CM

## 2019-07-29 DIAGNOSIS — I5189 Other ill-defined heart diseases: Secondary | ICD-10-CM

## 2019-07-29 DIAGNOSIS — G4733 Obstructive sleep apnea (adult) (pediatric): Secondary | ICD-10-CM | POA: Diagnosis not present

## 2019-07-29 DIAGNOSIS — I519 Heart disease, unspecified: Secondary | ICD-10-CM

## 2019-07-29 DIAGNOSIS — I10 Essential (primary) hypertension: Secondary | ICD-10-CM

## 2019-07-29 DIAGNOSIS — J449 Chronic obstructive pulmonary disease, unspecified: Secondary | ICD-10-CM

## 2019-07-29 DIAGNOSIS — R0600 Dyspnea, unspecified: Secondary | ICD-10-CM

## 2019-07-29 DIAGNOSIS — R0609 Other forms of dyspnea: Secondary | ICD-10-CM

## 2019-07-29 NOTE — Progress Notes (Signed)
Cardiology Office Note    Date:  07/31/2019   ID:  Louis [name], Louis [name] [birth year], MRN [id number]  PCP:  Rutherford Guys, MD  Cardiologist:  Shelva Majestic, MD   F/U cardiology evaluation   History of Present Illness:  Louis [name] is a 64 y.o. male who is the husband of Dr. Margaretha Glassing, a retired OB/GYN physician.  I had seen him in 2011.  In 2019, I saw him  for cardiology evaluation referred by Dr. Danise Mina following a recent hospitalization with complaints of increasing shortness of breath on September 18, 2017 and evaluation of diastolic dysfunction.  Louis [name]  has a long-standing history of hypertension and severe scoliosis.  I had seen him in 2011 at which time he had a mild RV conduction delay and was hypertensive on a multiple medical regimen.  An echo Doppler study on February 18, 2009 showed normal LV size and thickness with an EF greater than 55%.  At that time he had normal diastolic parameters.  There was mild mitral regurgitation and trace tricuspid insufficiency.  He had told me at that time that he had a double collecting system to his kidneys.  Renal duplex imaging suggested mild right renal artery narrowing in the 1 to 59% range.  At the time, he was on medical therapy with Bystolic, amlodipine, and direct renin inhibition.    When I saw him in 2019 I had not seen him since his 2011 evaluation. He was  hospitalized following a trip to [place] in early July 2019.  He developed significant hypoxia and hypercarbia and required transient BiPAP due to severe oxygen desaturation to 61%.  He was not found to have a pulmonary embolism.  A CT angiogram showed severe scoliosis and suggestion of pulmonary hypertension.  He was felt to have prominence of the main pulmonary outflow track and lower lobe atelectatic changes.  There was evidence for hepatic steatosis as well as aortic atherosclerosis.  An echo Doppler study revealed normal systolic function with grade 2 diastolic  dysfunction, decreased RV function with normal PA pressures.  He subsequently underwent a sleep study and was found to have severe sleep apnea with an AHI of 39/h for which CPAP was implemented with supplemental O2.  He is followed by [name] from a pulmonary standpoint.  Pulmonary function studies in August 2019 showed a postbronchodilator ratio at 71 with an FEV1 of 61% with FVC of 65% without significant buccal dilator response.  TLC was 90% with DLCO 96% suggestive of extraparenchymal restriction.  When I saw him for reevaluation in September 2019 he was undergoing pulmonary rehabilitation with some benefit.  He was on amlodipine 2.5 mg, furosemide 20 mg, losartan 100 mg, Toprol-XL 150 mg daily for his blood pressure.  He continued to experience some shortness of breath.  He was unaware of palpitations, and denied presyncope or syncope.  There is no history of tobacco use.  During that evaluation, since he had continue to experience some shortness of breath but had grade 2 diastolic dysfunction without significant edema I suggested discontinuance of furosemide and in its place initiated spironolactone.  A follow-up BMP 2 weeks later was excellent at 33.  I saw him for follow-up evaluation on December 10, 2017.  Over the present emergency room letter.  He continued to use CPAP with 100% compliance and was on 2 L of supplemental oxygen bled into his unit.  He completed pulmonary rehab and noticed much improvement in his shortness of breath.  He is now exercising 5 days/week.  He has adjusted his diet.  He denies chest pain PND orthopnea.  He will be seeing [name] in follow-up.  At that time, he was on amlodipine 2.5 mg, losartan 100 mg daily, Toprol-XL 150 mg, and spironolactone which have been titrated up to 25 mg.  I have not seen him since his December 2019 evaluation.  In January 2021, he apparently was hospitalized with a perforated duodenal ulcer and underwent surgery on January 13, 2019.  He was  recently hospitalized from June 24 through July 18, 2019 with sepsis secondary to Enterococcus in his blood and urine.  He was followed by infectious disease and was treated with appropriate antibiotic therapy for 14 days.  TEE was performed on June 29, 2019 and showed an EF of 60 to 65% without regional wall motion abnormalities.  RV systolic function was normal.  There was no evidence for endocarditis.  There is there was mild to moderate mitral regurgitation.  There was no mention of diastolic dysfunction.  There was no left ventricular hypertrophy.  He was to be discharged to inpatient rehab on June 30 but had a respiratory hypercarbic arrest and was transferred to the ICU where he was intubated and diuresed.  He was extubated 26 hours later.  Apparently he had not been placed on CPAP at night prior to his arrest and he had significant hypercarbia.  Following stabilization he ultimately went to inpatient rehab and was discharged on July 18, 2019.  He has been compliant with CPAP therapy with oxygen supplementation 2 L.  He has been monitoring his blood pressure at home following his discharge.  His wife then called the office and requested that I see the patient today for follow-up Cardiologic evaluation.  Presently he denies any chest pain PND orthopnea.  He has scoliosis.  He presents for cardiology follow-up evaluation with me.  Past Medical History:  Diagnosis Date  . Anxiety    situational  . BPH (benign prostatic hyperplasia)    on flomax  . Bundle branch block    per prior PCP records  . CHF (congestive heart failure) ([place])   . COPD (chronic obstructive pulmonary disease) ([place])   . Diverticulosis    by colonoscopy  . Eczema    per prior PCP records  . Elevated PSA 2015   peaked 6s, s/p benign biopsy 2014, sees urology [name] ([place])  . Essential tremor   . GERD (gastroesophageal reflux disease)    per prior PCP records  . History of panic attacks    per prior PCP records  .  HTN (hypertension)   . Mild intermittent asthma in adult without complication   . Obesity, Class I, BMI 30-34.9   . Osteoporosis    DEXA 06/2013 with osteopenia - h/o ?wrist/hip fracture from AVN from chronic steroid use (asthma), took reclast for 1 year  . Renal artery stenosis in 1 of 2 vessels ([place]) 2011   by [place]  . Seasonal allergies   . Sleep apnea   . Thoracic scoliosis childhood  . Transaminitis    per prior PCP records    Past Surgical History:  Procedure Laterality Date  . APPLICATION OF WOUND VAC Left 05/09/2018   Procedure: Application Of Wound Vac;  Surgeon: Newt Minion, MD;  Location: [facility];  Service: Orthopedics;  Laterality: Left;  . BIOPSY  04/30/2019   Procedure: BIOPSY;  Surgeon: Juanita Craver, MD;  Location: [facility];  Service: Endoscopy;;  .  COLONOSCOPY  12/2013   polyps, int hem, diverticulosis, rpt 5 yrs ([place])  . COLONOSCOPY WITH PROPOFOL N/A 04/30/2019   Procedure: COLONOSCOPY WITH PROPOFOL;  Surgeon: Juanita Craver, MD;  Location: [facility];  Service: Endoscopy;  Laterality: N/A;  . ELBOW SURGERY Right 2008   golfer's elbow  . ESOPHAGOGASTRODUODENOSCOPY (EGD) WITH PROPOFOL N/A 04/30/2019   Procedure: ESOPHAGOGASTRODUODENOSCOPY (EGD) WITH PROPOFOL;  Surgeon: Juanita Craver, MD;  Location: [facility];  Service: Endoscopy;  Laterality: N/A;  . INGUINAL HERNIA REPAIR Bilateral childhood & ~1995  . LAPAROTOMY N/A 01/05/2019   Procedure: EXPLORATORY LAPAROTOMY graham patch repair;  Surgeon: Benjamine Sprague, DO;  Location: [facility];  Service: General;  Laterality: N/A;  . NASAL SEPTUM SURGERY  2013   deviated septum  . ORIF ANKLE FRACTURE Left 05/09/2018   Procedure: OPEN REDUCTION INTERNAL FIXATION LEFT LISFRANC FRACTURE/DISLOCATION;  Surgeon: Newt Minion, MD;  Location: [facility];  Service: Orthopedics;  Laterality: Left;  . POLYPECTOMY  04/30/2019   Procedure: POLYPECTOMY;  Surgeon: Juanita Craver, MD;  Location: [facility];  Service: Endoscopy;;  . TEE WITHOUT  CARDIOVERSION N/A 06/29/2019   Procedure: TRANSESOPHAGEAL ECHOCARDIOGRAM (TEE);  Surgeon: Acie Fredrickson Wonda Cheng, MD;  Location: [facility];  Service: Cardiovascular;  Laterality: N/A;  . US ECHOCARDIOGRAPHY  02/2009   WNL, EF >55% [facility])  . US RENAL/AORTA Right 05/2009   1-59% diameter reduction renal artery, rec rpt 2 yrs    Current Medications: Outpatient Medications Prior to Visit  Medication Sig Dispense Refill  . ALPRAZolam (XANAX) 0.5 MG tablet Take 1 tablet (0.5 mg total) by mouth daily as needed (essential tremor). 20 tablet 0  . Amino Acids-Protein Hydrolys (FEEDING SUPPLEMENT, PRO-STAT SUGAR FREE 64,) LIQD Take 30 mLs by mouth 3 (three) times daily with meals. 3000 mL 0  . amLODipine (NORVASC) 5 MG tablet TAKE 1 TABLET(5 MG) BY MOUTH DAILY (Patient taking differently: Take 5 mg by mouth daily. ) 90 tablet 2  . cyanocobalamin 1000 MCG tablet Take 1 tablet (1,000 mcg total) by mouth daily. Available over the counter 30 tablet 0  . docusate sodium (COLACE) 100 MG capsule Take 1 capsule (100 mg total) by mouth 2 (two) times daily as needed for mild constipation. Available over the counter 60 capsule 0  . folic acid (FOLVITE) 1 MG tablet Take 1 tablet (1 mg total) by mouth daily.    [name] gabapentin (NEURONTIN) 300 MG capsule Take 1 capsule (300 mg total) by mouth 2 (two) times daily. 60 capsule 0  . losartan (COZAAR) 100 MG tablet Take 1 tablet (100 mg total) by mouth daily. 30 tablet 0  . magnesium oxide (MAG-OX) 400 (241.3 Mg) MG tablet Take 1 tablet (400 mg total) by mouth with breakfast, with lunch, and with evening meal. 90 tablet 0  . metoprolol succinate (TOPROL-XL) 50 MG 24 hr tablet Take 1 tablet (50 mg total) by mouth daily. Take with a meal. 30 tablet 0  . Multiple Vitamin (MULTIVITAMIN WITH MINERALS) TABS tablet Take 1 tablet by mouth daily.    [name] omeprazole (PRILOSEC) 20 MG capsule Take 20 mg by mouth daily.    [name] thiamine 100 MG tablet Take 1 tablet (100 mg total) by mouth daily.  Available over the counter 30 tablet 0   No facility-administered medications prior to visit.     Allergies:   Accupril [quinapril hcl] and Nsaids   Social History   Socioeconomic History  . Marital status: Married    Spouse name: Not on file  .  Number of children: Not on file  . Years of education: Not on file  . Highest education level: Not on file  Occupational History  . Not on file  Tobacco Use  . Smoking status: Never Smoker  . Smokeless tobacco: Never Used  Vaping Use  . Vaping Use: Never used  Substance and Sexual Activity  . Alcohol use: Not Currently    Alcohol/week: 14.0 standard drinks    Types: 14 Glasses of wine per week    Comment: Social  . Drug use: No  . Sexual activity: Yes    Birth control/protection: None  Other Topics Concern  . 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 Difficulty of Paying Living Expenses:   Food Insecurity:   . Worried About Charity fundraiser in the Last Year:   . Arboriculturist in the Last Year:   Transportation Needs:   . Film/video editor (Medical):   [name] Lack of Transportation (Non-Medical):   Physical Activity:   . Days of Exercise per Week:   . Minutes of Exercise per Session:   Stress:   . Feeling of Stress :   Social Connections:   . Frequency of Communication with Friends and Family:   . Frequency of Social Gatherings with Friends and Family:   . Attends Religious Services:   . Active Member of Clubs or Organizations:   . Attends Archivist Meetings:   [name] Marital Status:     Socially he grew up in [place].  He is a Theme park manager.  There is no tobacco history.  He has 2 children Augus who is 105  And lives in  [place] and [place] age 73 who has a Oceanographer in Estate manager/land agent.  Family History:  The patient's family history includes Alcohol abuse in his mother; Ataxia in his brother and sister; Cancer in his maternal grandmother; Cancer (age of onset: 24) in his father; Diabetes in his mother; Prostate cancer in his father; Stroke in his mother.  His father had colon cancer, paternal grandmother had pancreatic cancer.  His father's mother suffered a heart attack in her 30s.  2 of his siblings have cerebellar ataxia.  ROS General: Negative; No fevers, chills, or night sweats;  HEENT: Negative; No changes in vision or hearing, sinus congestion, difficulty swallowing Pulmonary: Positive for recent hospitalization with hypoxia and hypercarbic respiratory failure. Cardiovascular: Negative; No chest pain, presyncope, syncope, palpitations GI: Perforated duodenal ulcer January 2021 GU: Negative; No dysuria, hematuria, or difficulty voiding Musculoskeletal: Severe scoliosis.  History of osteoporosis Hematologic/Oncology: Negative; no easy bruising, bleeding Endocrine: Negative; no heat/cold intolerance; no diabetes Neuro: Negative; no changes in balance, headaches Skin: Negative; No rashes or skin lesions Psychiatric: Negative; No behavioral problems, depression Sleep: Positive for recently diagnosed severe obstructive sleep apnea, currently on CPAP at 9 with 2 L supplemental oxygen; no bruxism, restless legs, hypnogognic hallucinations, no cataplexy Other comprehensive 14 point system review is negative.   PHYSICAL EXAM:   VS:  BP 112/70 (BP Location: Left Arm, Patient Position: Sitting, Cuff Size: Large)   Pulse 71   Ht _0  (1.803 m)   Wt (!) 211  lb 9.6 oz (96 kg)   BMI 29.51 kg/m     Repeat blood pressure by me was 118/78 supine 114/78 standing  Wt Readings from Last 3 Encounters:  07/29/19 (!) 211 lb 9.6 oz (96 kg)  07/27/19 (!) 215 lb (97.5 kg)  07/24/19 (!) 213 lb (96.6 kg)    General:  Alert, oriented, no distress.  Skin: normal turgor, no rashes, warm and dry HEENT: Normocephalic, atraumatic. Pupils equal round and reactive to light; sclera anicteric; extraocular muscles intact;  Nose without nasal septal hypertrophy Mouth/Parynx benign; Mallinpatti scale 3 Neck: No JVD, no carotid bruits; normal carotid upstroke Lungs: clear to ausculatation and percussion; no wheezing or rales Chest wall: without tenderness to palpitation Heart: PMI not displaced, RRR, s1 s2 normal, 1/6 systolic murmur, no diastolic murmur, no rubs, gallops, thrills, or heaves Abdomen: Ventral hernia soft, nontender; no hepatosplenomehaly, BS+; abdominal aorta nontender and not dilated by palpation. Back: Severe scoliosis Pulses 2+ Musculoskeletal: full range of motion, normal strength, no joint deformities Extremities: Trivial ankle swelling; no clubbing cyanosis, Homan's sign negative  Neurologic: grossly nonfocal; Cranial nerves grossly wnl Psychologic: Normal mood and affect   Studies/Labs Reviewed:   EKG:  EKG is ordered today.  ECG (independently read by me): Normal sinus rhythm at 71 bpm.  No ectopy.  Normal intervals.  12/10/2017 ECG (independently read by me): Normal sinus rhythm at 72 bpm.  No ectopy.  Normal intervals.  September 18, 2017 ECG (independently read by me): Normal sinus rhythm at 68 bpm.  Borderline LA enlargement.  Nonspecific T changes.  Normal intervals.  No ectopy.  Recent Labs: BMP Latest Ref Rng & Units 07/21/2019 07/13/2019 07/08/2019  Glucose 65 - 99 mg/dL 96 99 99  BUN 8 - 27 mg/dL _0 Creatinine 0.76 - 1.27 mg/dL 0.88 1.11 0.80  BUN/Creat Ratio 10 - 24 14 - -  Sodium 134 - 144 mmol/L 135 135 135  Potassium 3.5 - 5.2 mmol/L 4.4 4.0 3.9  Chloride 96 - 106 mmol/L 99 100 99  CO2 20 - 29 mmol/L _1 Calcium 8.6 - 10.2 mg/dL 9.2 9.3 9.4     Hepatic Function Latest Ref Rng & Units 07/21/2019 07/08/2019 07/04/2019  Total Protein 6.0 - 8.5 g/dL 6.8 6.3(L) 6.7    Albumin 3.8 - 4.8 g/dL 3.9 2.6(L) 2.9(L)  AST 0 - 40 IU/L 16 35 39  ALT 0 - 44 IU/L 15 40 35  Alk Phosphatase 48 - 121 IU/L 77 54 63  Total Bilirubin 0.0 - 1.2 mg/dL 0.5 0.6 0.9    CBC Latest Ref Rng & Units 07/13/2019 07/08/2019 07/07/2019  WBC 4.0 - 10.5 K/uL 4.2 5.9 5.8  Hemoglobin 13.0 - 17.0 g/dL 9.3(L) 9.5(L) 8.7(L)  Hematocrit 39 - 52 % 29.3(L) 29.3(L) 26.6(L)  Platelets 150 - 400 K/uL 355 438(H) 385   Lab Results  Component Value Date   MCV 104.3 (H) 07/13/2019   MCV 102.4 (H) 07/08/2019   MCV 101.9 (H) 07/07/2019   Lab Results  Component Value Date   TSH 0.937 07/02/2019   Lab Results  Component Value Date   HGBA1C 5.7 (H) 06/26/2019     BNP    Component Value Date/Time   BNP 229.1 (H) 06/25/2019 1217    ProBNP No results found for: PROBNP   Lipid Panel     Component Value Date/Time   CHOL 214 (H) 09/26/2015 1328   CHOL 204 10/11/2014 0000   TRIG 135 07/02/2019 0901  TRIG 51 10/11/2014 0000   HDL 100.60 09/26/2015 1328   HDL 75 10/11/2014 0000   CHOLHDL 2 09/26/2015 1328   VLDL 12.0 09/26/2015 1328   LDLCALC 101 (H) 09/26/2015 1328   LDLCALC 119 10/11/2014 0000     RADIOLOGY: DG Abd 1 View  Result Date: 07/01/2019 CLINICAL DATA:  Nasogastric tube placement EXAM: ABDOMEN - 1 VIEW COMPARISON:  January 08, 2019 FINDINGS: Nasogastric tube tip and side port are in the proximal stomach. No evident bowel dilatation or air-fluid levels. No free air. Atelectatic change noted in right lung base. Marked lower thoracic and lumbar scoliosis noted. IMPRESSION: Nasogastric tube tip and side port in proximal stomach. No bowel obstruction or free air appreciable. Marked scoliosis. Electronically Signed   By: Lowella Grip III M.D.   On: 07/01/2019 08:39   CT HEAD WO CONTRAST  Result Date: 07/01/2019 CLINICAL DATA:  Mental status change with unknown cause. Hypercarbia and hypoxemia EXAM: CT HEAD WITHOUT CONTRAST TECHNIQUE: Contiguous axial images were obtained  from the base of the skull through the vertex without intravenous contrast. COMPARISON:  06/25/2019 brain MRI FINDINGS: Brain: No evidence of acute infarction, hemorrhage, hydrocephalus, extra-axial collection or mass lesion/mass effect. Generalized cortical and cerebellar atrophy greater than expected for age. Vascular: No hyperdense vessel or unexpected calcification. Skull: Normal. Negative for fracture or focal lesion. Sinuses/Orbits: No acute finding. Other: [name] oropharyngeal catheter loops through the nasopharynx. The tip is at the stomach by prior radiograph IMPRESSION: 1. No acute finding. 2. Generalized atrophy. 3. The orogastric tube loops through the nasopharynx but the tip reaches the stomach by prior radiograph Electronically Signed   By: Monte Fantasia M.D.   On: 07/01/2019 10:39   Portable Chest x-ray  Result Date: 07/01/2019 CLINICAL DATA:  ETT placement. EXAM: PORTABLE CHEST 1 VIEW COMPARISON:  07/01/2019 at 6:43 a.m. FINDINGS: Endotracheal tube present approximately 4 cm above the carina. Gastric tube courses through in off the field of the radiograph into the upper stomach, see dedicated abdominal film for further detail. Mediastinal contours are partially obscured. Lung bases excluded from chest radiograph, included on abdominal radiograph. Persistent opacity and effusion noted at the RIGHT lung base. Increased interstitial markings throughout the chest with graded opacity also in the LEFT chest. LEFT hemidiaphragm remains visible. RIGHT-sided PICC line remains in place. Multiple leads partially obscure the tip of the PICC line. Exact position difficult to determine but grossly unchanged compared to the recent chest x-ray likely near the caval to atrial junction. IMPRESSION: 1. Interval placement of endotracheal tube approximately 4 cm above the carina. 2. No interval change in the appearance of the lungs compared to earlier today. Lung bases excluded from chest radiograph, included on  abdominal radiograph. Findings remain concerning for bilateral airspace disease with concomitant effusions RIGHT greater than LEFT. 3. PICC line remains in place, tip not well assessed but grossly similar to the prior exam where was seen at the caval to atrial junction. Electronically Signed   By: Zetta Bills M.D.   On: 07/01/2019 08:43   DG Foot 2 Views Right  Result Date: 07/06/2019 CLINICAL DATA:  History of fracture the 5th metatarsal 2 weeks ago. EXAM: RIGHT FOOT - 2 VIEW COMPARISON:  06/16/2019 FINDINGS: Comminuted fracture of the base of the 5th metatarsal shows increased lucency at the fracture site. No definitive callus. No change in alignment. There is increased soft tissue swelling IMPRESSION: Increased lucency at the fracture site. No definitive callus. Electronically Signed   By: Nolon Nations  M.D.   On: 07/06/2019 13:56   XR Foot 2 Views Right  Result Date: 07/24/2019 2 views of his foot were taken today they demonstrate some bony bridging across the fracture site fracture remains reduced in good position    Additional studies/ records that were reviewed today include:  ------------------------------------------------------------------- 07/03/2017 ECHO Study Conclusions  - Left ventricle: The cavity size was normal. There was mild   concentric hypertrophy. Systolic function was normal. The   estimated ejection fraction was in the range of 60% to 65%. Wall   motion was normal; there were no regional wall motion   abnormalities. Features are consistent with a pseudonormal left   ventricular filling pattern, with concomitant abnormal relaxation   and increased filling pressure (grade 2 diastolic dysfunction).   There was no evidence of elevated ventricular filling pressure by   Doppler parameters. - Aortic valve: There was no regurgitation. - Mitral valve: There was no regurgitation. - Left atrium: The atrium was moderately dilated. - Right ventricle: The cavity size was  moderately dilated. Wall   thickness was normal. Systolic function was mildly reduced. - Right atrium: The atrium was normal in size. - Pulmonic valve: There was trivial regurgitation. - Inferior vena cava: The vessel was normal in size. - Pericardium, extracardiac: A trivial pericardial effusion was   identified posterior to the heart. Features were not consistent   with tamponade physiology.   07/02/2017  CT Angio Chest  MPRESSION: 1. No demonstrable pulmonary embolus. No thoracic aortic aneurysm or dissection. There is aortic and great vessel atherosclerosis.  2. Prominence of the main pulmonary outflow tract, a finding felt to be indicative of pulmonary arterial hypertension.  3. Lower lobe atelectatic change. Suspect early pneumonia in each posterior lung base.  4.  No evident thoracic adenopathy.  5.  Hepatic steatosis.  Aortic Atherosclerosis (ICD10-I70.0).  ECHO 06/26/2019 IMPRESSIONS  1. Left ventricular ejection fraction, by estimation, is 55 to 60%. The  left ventricle has normal function. The left ventricle has no regional  wall motion abnormalities. Left ventricular diastolic parameters were  normal.  2. Right ventricular systolic function is mildly reduced at the RV apex.  The right ventricular size is normal. Tricuspid regurgitation signal is  inadequate for assessing PA pressure.  3. LA not well visualized. Left atrial size was mildly dilated by visual  estimate.  4. The mitral valve is normal in structure. Trivial mitral valve  regurgitation. No evidence of mitral stenosis.  5. The aortic valve is tricuspid. Aortic valve regurgitation is not  visualized. No aortic stenosis is present.  6. Aortic dilatation noted. There is mild dilatation of the aortic root  measuring 43 mm.  7. The inferior vena cava is normal in size with greater than 50%  respiratory variability, suggesting right atrial pressure of 3 mmHg.   Conclusion(s)/Recommendation(s):  No evidence of valvular vegetations on  this transthoracic echocardiogram. Would recommend a transesophageal  echocardiogram to exclude infective endocarditis if clinically indicated.  This exam was suboptimal for the  assessment of valvular lesions due to image quality.   TEE 06/29/2019 1. Left ventricular ejection fraction, by estimation, is 60 to 65%. The left ventricle has normal function. The left ventricle has no regional wall motion abnormalities. 2. Right ventricular systolic function is normal. The right ventricular size is normal. 3. No left atrial/left atrial appendage thrombus was detected. 4. The mitral valve is grossly normal. Mild to moderate mitral valve regurgitation. No evidence of mitral stenosis. 5. The aortic valve is  normal in structure. Aortic valve regurgitation is trivial. No aortic stenosis is present.   ASSESSMENT:    1. Essential hypertension   2. Chronic obstructive pulmonary disease, unspecified COPD type (HCC)   3. Leg swelling   4. OSA (obstructive sleep apnea)   5. DOE (dyspnea on exertion)   6. Prior history of Grade II diastolic dysfunction   7. Sepsis due to Enterococcus [facility]): June 25, 2019   8. 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  In 2011 an echo Doppler study showed normal systolic and diastolic function.  He was hospitalized following a trip to [place] with hypoxia and  respiratory failure requiring transient BiPAP therapy due to severe oxygen desaturation.  He was diuresed with Lasix.  BNP was 233.  He subsequently underwent pulmonary function studies as well as a sleep evaluation and has severe sleep apnea and has been on CPAP therapy with supplemental oxygen.  His echo Doppler study July 2019 showed  normal systolic function but demonstrated grade 2 diastolic dysfunction.  His left atrium was moderately dilated  as was his right ventricle with suggestion of possible mild RV systolic function reduction.  He had trivial pulmonic insufficiency.  A trivial pericardial effusion was identified not felt to be significant.  When I saw him for reestablishment of care in September 2019, he did not have edema and in light of his grade 2 diastolic dysfunction I initiated Spironolactone in place of furosemide.  He felt significantly improved with this therapy.  A subsequent BNP level several weeks later was excellent at 33.  He has improved aerobic capacity and endurance since participating in pulmonary rehab and objectively notes significant benefit in his previous shortness of breath.  Over the past year he has had difficulty and developed a perforated ulcer in January 2021 necessitating surgery.  He was recently hospitalized with Enterococcus sepsis with Enterococcus sepsis in his blood and urine.  TEE was negative for endocarditis.  His 2D echo Doppler study showed normal LV function and there was no evidence for any of the previous diastolic dysfunction.  I extensively reviewed his hospital records as well as pulmonary evaluation.  He is no longer on spironolactone following his recent hospitalization where he developed hypercarbic respiratory failure requiring intubation.  His blood pressure today is stable on amlodipine 5 mg, losartan 100 mg in addition to metoprolol succinate 50 mg daily.  His ECG today is stable and he is in sinus rhythm at 71 bpm.  They were concerned that he was no longer on spironolactone.  Presently I do not believe he needs this therapy particularly with his stable blood pressure and improvement from his previous diastolic dysfunction.  He continues to use CPAP with 100% compliance with 2 L of bleeding oxygen daily.  This is followed by Dr. Emelia Loron.  He believes he is sleeping well.  He underwent bronchoscopy while hospitalized and cultures resulted in normal flora.  He completed a 14-day course of  antibiotic therapy and recently has seen Dr. Graylon Good in follow-up evaluation.  From a cardiovascular standpoint he is stable without orthostatic hypotension.  He has a ventral hernia currently nontender.  He will be following up with pulmonary.  I will see him in 1 year for cardiology evaluation or sooner as needed.    Medication Adjustments/Labs and Tests Ordered: Current medicines are reviewed at length  with the patient today.  Concerns regarding medicines are outlined above.  Medication changes, Labs and Tests ordered today are listed in the Patient Instructions below. Patient Instructions  Medication Instructions:  CONTINUE WITH CURRENT MEDICATIONS. NO CHANGES.  *If you need a refill on your cardiac medications before your next appointment, please call your pharmacy*    Follow-Up: At [facility], you and your health needs are our priority.  As part of our continuing mission to provide you with exceptional heart care, we have created designated Provider Care Teams.  These Care Teams include your primary Cardiologist (physician) and Advanced Practice Providers (APPs -  Physician Assistants and Nurse Practitioners) who all work together to provide you with the care you need, when you need it.  We recommend signing up for the patient portal called "MyChart".  Sign up information is provided on this After Visit Summary.  MyChart is used to connect with patients for Virtual Visits (Telemedicine).  Patients are able to view lab/test results, encounter notes, upcoming appointments, etc.  Non-urgent messages can be sent to your provider as well.   To learn more about what you can do with MyChart, go to [email or web address].    Your next appointment:   12 month(s)  The format for your next appointment:   In Person  Provider:   Shelva Majestic, MD       Signed, Shelva Majestic, MD  07/31/2019 7:19 AM    [facility] [address] [place], [place], [place]   [address] Phone: [phone]

## 2019-07-29 NOTE — Patient Instructions (Signed)

## 2019-07-30 NOTE — Telephone Encounter (Signed)
Addressed with lab staff. Insufficient sample. Patient will be called for repeat.  Cont mag dose Labs ordered by dr snyder need to be drawn at lab station not at our clinic.

## 2019-07-31 ENCOUNTER — Ambulatory Visit: Payer: 59 | Admitting: Physical Therapy

## 2019-07-31 ENCOUNTER — Other Ambulatory Visit: Payer: Self-pay

## 2019-07-31 ENCOUNTER — Encounter: Payer: Self-pay | Admitting: Physical Therapy

## 2019-07-31 ENCOUNTER — Encounter: Payer: Self-pay | Admitting: Cardiovascular Disease

## 2019-07-31 DIAGNOSIS — M6281 Muscle weakness (generalized): Secondary | ICD-10-CM

## 2019-07-31 DIAGNOSIS — R2681 Unsteadiness on feet: Secondary | ICD-10-CM

## 2019-07-31 DIAGNOSIS — R2689 Other abnormalities of gait and mobility: Secondary | ICD-10-CM | POA: Diagnosis not present

## 2019-07-31 NOTE — Therapy (Signed)
[facility] [address] [place], [place], [address] Phone: [phone]   Fax:  [phone]  Physical Therapy Treatment  Patient Details  Name: Louis [name] MRN: [id number] Date of Birth: [birth year] Referring Provider (PT): Marlowe Shores, PA-C Grant Fontana, MD)   Encounter Date: 07/31/2019   PT End of Session - 07/31/19 0720    Visit Number 2    Number of Visits 10    Authorization Type [employer], covered 100% as ded and OOPM met; VL:  30    PT Start Time 0719    PT Stop Time 0800    PT Time Calculation (min) 41 min    Equipment Utilized During Treatment Gait belt    Activity Tolerance Patient tolerated treatment well    Behavior During Therapy WFL for tasks assessed/performed           Past Medical History:  Diagnosis Date  . Anxiety    situational  . BPH (benign prostatic hyperplasia)    on flomax  . Bundle branch block    per prior PCP records  . CHF (congestive heart failure) ([place])   . COPD (chronic obstructive pulmonary disease) ([place])   . Diverticulosis    by colonoscopy  . Eczema    per prior PCP records  . Elevated PSA 2015   peaked 6s, s/p benign biopsy 2014, sees urology [name] ([place])  . Essential tremor   . GERD (gastroesophageal reflux disease)    per prior PCP records  . History of panic attacks    per prior PCP records  . HTN (hypertension)   . Mild intermittent asthma in adult without complication   . Obesity, Class I, BMI 30-34.9   . Osteoporosis    DEXA 06/2013 with osteopenia - h/o ?wrist/hip fracture from AVN from chronic steroid use (asthma), took reclast for 1 year  . Renal artery stenosis in 1 of 2 vessels ([place]) 2011   by [place]  . Seasonal allergies   . Sleep apnea   . Thoracic scoliosis childhood  . Transaminitis    per prior PCP records    Past Surgical History:  Procedure Laterality Date  . APPLICATION OF WOUND VAC Left 05/09/2018   Procedure: Application Of Wound  Vac;  Surgeon: Newt Minion, MD;  Location: [facility];  Service: Orthopedics;  Laterality: Left;  . BIOPSY  04/30/2019   Procedure: BIOPSY;  Surgeon: Juanita Craver, MD;  Location: [facility];  Service: Endoscopy;;  . COLONOSCOPY  12/2013   polyps, int hem, diverticulosis, rpt 5 yrs ([place])  . COLONOSCOPY WITH PROPOFOL N/A 04/30/2019   Procedure: COLONOSCOPY WITH PROPOFOL;  Surgeon: Juanita Craver, MD;  Location: [facility];  Service: Endoscopy;  Laterality: N/A;  . ELBOW SURGERY Right 2008   golfer's elbow  . ESOPHAGOGASTRODUODENOSCOPY (EGD) WITH PROPOFOL N/A 04/30/2019   Procedure: ESOPHAGOGASTRODUODENOSCOPY (EGD) WITH PROPOFOL;  Surgeon: Juanita Craver, MD;  Location: [facility];  Service: Endoscopy;  Laterality: N/A;  . INGUINAL HERNIA REPAIR Bilateral childhood & ~1995  . LAPAROTOMY N/A 01/05/2019   Procedure: EXPLORATORY LAPAROTOMY graham patch repair;  Surgeon: Benjamine Sprague, DO;  Location: [facility];  Service: General;  Laterality: N/A;  . NASAL SEPTUM SURGERY  2013   deviated septum  . ORIF ANKLE FRACTURE Left 05/09/2018   Procedure: OPEN REDUCTION INTERNAL FIXATION LEFT LISFRANC FRACTURE/DISLOCATION;  Surgeon: Newt Minion, MD;  Location: [facility];  Service: Orthopedics;  Laterality: Left;  . POLYPECTOMY  04/30/2019   Procedure: POLYPECTOMY;  Surgeon: [name],  Mar Daring, MD;  Location: [facility];  Service: Endoscopy;;  . TEE WITHOUT CARDIOVERSION N/A 06/29/2019   Procedure: TRANSESOPHAGEAL ECHOCARDIOGRAM (TEE);  Surgeon: Acie Fredrickson Wonda Cheng, MD;  Location[facility] END[facility];  Service: Cardiovascular;  Laterality: N/A;  . US ECHOCARDIOGRAPHY  02/2009   WNL, EF >55% [facility])  . US RENAL/AORTA Right 05/2009   1-59% diameter reduction renal artery, rec rpt 2 yrs    There were no vitals filed for this visit.   Subjective Assessment - 07/31/19 0720    Subjective No new complaints. No falls or pain to report.    Pertinent History L foot fracture, sepsis with septic shock, acute renal railure, OSA, GERD,  anxiety, HTN, restrictive lung disease with scoliosis, neuropathy    Patient Stated Goals Pt's goal for therapy are for strengthening and endurance.    Currently in Pain? No/denies                  [facility] Adult PT Treatment/Exercise - 07/31/19 0722      Transfers   Transfers Sit to Stand;Stand to Sit    Sit to Stand 5: Supervision;With upper extremity assist;From bed;From chair/3-in-1    Stand to Sit 5: Supervision;With upper extremity assist;To bed;To chair/3-in-1      Ambulation/Gait   Ambulation/Gait Yes    Ambulation/Gait Assistance 5: Supervision    Assistive device None    Gait Pattern Step-through pattern;Wide base of support    Ambulation Surface Level;Indoor      Exercises   Exercises Other Exercises    Other Exercises  issued ex's to HEP for balance and strengthening. Refer to [place] for full details.           Issued the following to HEP today:  Access Code: [email or web address] URL: [email or web address] Date: 07/31/2019 Prepared by: Willow Ora  Exercises Sit to Stand with Arms Crossed - 1 x daily - 5 x weekly - 1 sets - 5-10 reps Heel Toe Raises with Counter Support - 1 x daily - 5 x weekly - 1 sets - 10 reps Walking March - 1 x daily - 5 x weekly - 1 sets - 3 reps Tandem Walking with Counter Support - 1 x daily - 5 x weekly - 1 sets - 3 reps Standing Near Stance in Corner with Eyes Closed - 1 x daily - 5 x weekly - 1 sets - 3 reps - 30 hold Standing Balance in Corner with Eyes Closed - 1 x daily - 5 x weekly - 1 sets - 10 reps Tandem Stance with Eyes Closed in Corner - 1 x daily - 5 x weekly - 1 sets - 3 reps - 30 hold       PT Education - 07/31/19 1325    Education Details HEP for strengthening and balance    Person(s) Educated Patient    Methods Explanation;Demonstration;Verbal cues;Handout    Comprehension Verbalized understanding;Returned demonstration;Verbal cues required;Need further instruction               PT Long Term  Goals - 07/27/19 0909      PT LONG TERM GOAL #1   Title Pt will be independent with HEP for improved strength, balance, transfers, and gait.  TARGET 08/28/2019 (may be extended 1 additional week, due to pt out of town)    Time 5    Period Weeks    Status New      PT LONG TERM GOAL #2   Title Pt will improve 5x sit<>stand to less than or equal  to 20 sec to demonstrate improved functional strength and transfer efficiency.    Baseline 29.87 sec    Time 5    Period Weeks    Status New      PT LONG TERM GOAL #3   Title Pt will improve DGI score to at least 20/24 to decrease fall risk.    Baseline 17/24    Time 5    Period Weeks    Status New      PT LONG TERM GOAL #4   Title Pt will improve distance ambulated in 3 MWT to at least 610 ft for improved gait efficiency with community gait.    Baseline 563 ft in 3 minutes    Time 5    Period Weeks    Status New      PT LONG TERM GOAL #5   Title Pt stand at least 30 seconds EC on foam, demonstrating improved vestibular input/use for balance.    Baseline 10 sec foam, EC with min guard and incr. sway    Time 5    Period Weeks    Status New      Additional Long Term Goals   Additional Long Term Goals Yes      PT LONG TERM GOAL #6   Title Pt will verbalize understanding of fall prevention in home environment.    Time 5    Period Weeks    Status New                 Plan - 07/31/19 4081    Clinical Impression Statement Today's skilled session focused on establishment of an HEP to address strengthening and balance. No issues noted or reported with performance in session today. The pt is progressing toward goals and should benefit from continued PT to progress toward unmet goals.    Personal Factors and Comorbidities Comorbidity 3+    Comorbidities history of familial tremors, restrictive lung disease with scoliosis, perforated duodenal ulcer, hepatic steatosis, moderate to excessive alcohol use, left Lisfranc fracture with  dislocations s/p ORIF with onset of neuropathy, B12 deficiency, gait disorder with history of falls; R 5th metatarsal fx s/p fall 06/2019    Examination-Activity Limitations Locomotion Level;Transfers;Stand;Stairs    Examination-Participation Restrictions Church;Community Activity;Other   Fly fishing, golf, pilates   Stability/Clinical Decision Making Evolving/Moderate complexity    Rehab Potential Good    PT Frequency Other (comment)   1x/wk for 1 week, then 2x/wk for 4 weeks   PT Duration Other (comment)   5 weeks total POC   PT Treatment/Interventions ADLs/Self Care Home Management;Gait training;Stair training;Functional mobility training;Therapeutic activities;Therapeutic exercise;Balance training;Neuromuscular re-education;Patient/family education    PT Next Visit Plan work on activity tolerance, LE strengtheing and balance reactions on compliant sufaces with/without vision removed.    Consulted and Agree with Plan of Care Patient           Patient will benefit from skilled therapeutic intervention in order to improve the following deficits and impairments:  Abnormal gait, Difficulty walking, Decreased endurance, Decreased activity tolerance, Decreased balance, Decreased mobility, Decreased strength, Postural dysfunction  Visit Diagnosis: Other abnormalities of gait and mobility  Unsteadiness on feet  Muscle weakness (generalized)     Problem List Patient Active Problem List   Diagnosis Date Noted  . Neuropathy 07/20/2019  . Hypomagnesemia   . Macrocytic anemia   . Bacteremia   . Debility 07/07/2019  . Acute respiratory failure with hypoxia ([place])   . Atelectasis   . Enterococcal bacteremia 06/26/2019  .  AKI (acute kidney injury) ([place]) 06/25/2019  . Perforated viscus 01/06/2019  . Severe sepsis with septic shock ([place]) 01/06/2019  . Acute renal failure ([place]) 01/06/2019  . Hyperkalemia 01/06/2019  . Hyponatremia 01/06/2019  . Lactic acidosis 01/06/2019  . Onychomycosis  05/19/2018  . Foot fracture, left, closed, initial encounter 05/09/2018  . Lisfranc dislocation, left, initial encounter   . Acute bronchitis 02/25/2018  . Primary pulmonary hypertension ([place]) 07/09/2017  . Diastolic dysfunction 75/91/6384  . OSA (obstructive sleep apnea) 07/08/2017  . SOB (shortness of breath) 07/02/2017  . Chronic respiratory failure with hypoxia and hypercapnia ([place]) 07/02/2017  . Cardiomegaly 07/02/2017  . Exposure keratitis 02/17/2017  . Pedal edema 12/18/2016  . Transaminitis 12/18/2016  . Bilateral pes planus 12/18/2016  . Psoriasis-eczema overlap condition 07/30/2016  . High serum high density lipoprotein (HDL) 09/26/2015  . Epistaxis 09/26/2015  . Renal artery stenosis in 1 of 2 vessels ([place])   . Essential tremor   . Elevated PSA   . GERD (gastroesophageal reflux disease)   . Health maintenance examination 10/08/2014  . Anxiety state 10/08/2014  . Obesity, Class I, BMI 30-34.9   . Mild intermittent asthma in adult without complication   . Seasonal allergies   . HTN (hypertension)   . Scoliosis   . Osteoporosis     Willow Ora, PTA, [facility] [address], [place], [place] [address] [phone] 07/31/19, 1:28 PM   Name: Louis [name] MRN: [id number] Date of Birth: October 29, 1955

## 2019-07-31 NOTE — Patient Instructions (Signed)
Access Code: [email or web address] URL: [email or web address] Date: 07/31/2019 Prepared by: Willow Ora  Exercises Sit to Stand with Arms Crossed - 1 x daily - 5 x weekly - 1 sets - 5-10 reps Heel Toe Raises with Counter Support - 1 x daily - 5 x weekly - 1 sets - 10 reps Walking March - 1 x daily - 5 x weekly - 1 sets - 3 reps Tandem Walking with Counter Support - 1 x daily - 5 x weekly - 1 sets - 3 reps Standing Near Stance in Corner with Eyes Closed - 1 x daily - 5 x weekly - 1 sets - 3 reps - 30 hold Standing Balance in Corner with Eyes Closed - 1 x daily - 5 x weekly - 1 sets - 10 reps Tandem Stance with Eyes Closed in Corner - 1 x daily - 5 x weekly - 1 sets - 3 reps - 30 hold

## 2019-08-02 LAB — CULTURE, BLOOD (SINGLE)
MICRO NUMBER:: 10749918
MICRO NUMBER:: 10749919
Result:: NO GROWTH
SPECIMEN QUALITY:: ADEQUATE

## 2019-08-03 ENCOUNTER — Ambulatory Visit: Payer: 59 | Attending: Physician Assistant

## 2019-08-03 ENCOUNTER — Telehealth: Payer: Self-pay

## 2019-08-03 ENCOUNTER — Ambulatory Visit (INDEPENDENT_AMBULATORY_CARE_PROVIDER_SITE_OTHER): Payer: 59

## 2019-08-03 ENCOUNTER — Other Ambulatory Visit: Payer: Self-pay

## 2019-08-03 DIAGNOSIS — R2681 Unsteadiness on feet: Secondary | ICD-10-CM

## 2019-08-03 DIAGNOSIS — M6281 Muscle weakness (generalized): Secondary | ICD-10-CM | POA: Diagnosis present

## 2019-08-03 DIAGNOSIS — D539 Nutritional anemia, unspecified: Secondary | ICD-10-CM | POA: Diagnosis not present

## 2019-08-03 DIAGNOSIS — R2689 Other abnormalities of gait and mobility: Secondary | ICD-10-CM

## 2019-08-03 LAB — POCT CBC
Granulocyte percent: 69.5 %G (ref 37–80)
HCT, POC: 32.4 % (ref 29–41)
Hemoglobin: 10.4 g/dL — AB (ref 11–14.6)
Lymph, poc: 1.9 (ref 0.6–3.4)
MCH, POC: 32 pg — AB (ref 27–31.2)
MCHC: 32.1 g/dL (ref 31.8–35.4)
MCV: 99.9 fL (ref 76–111)
MID (cbc): 0.5 (ref 0–0.9)
MPV: 6.2 fL (ref 0–99.8)
POC Granulocyte: 5.6 (ref 2–6.9)
POC LYMPH PERCENT: 23.9 %L (ref 10–50)
POC MID %: 6.6 %M (ref 0–12)
Platelet Count, POC: 438 10*3/uL — AB (ref 142–424)
RBC: 3.24 M/uL — AB (ref 4.69–6.13)
RDW, POC: 14.5 %
WBC: 8.1 10*3/uL (ref 4.6–10.2)

## 2019-08-03 NOTE — Telephone Encounter (Signed)
Please let patient know that normal white blood cell count, improved anemia and stable platelets. thanks

## 2019-08-03 NOTE — Therapy (Signed)
[facility] [address] [place], [place], [address] Phone: [phone]   Fax:  [phone]  Physical Therapy Treatment  Patient Details  Name: Louis [name] MRN: [id number] Date of Birth: 1955/01/16 Referring Provider (PT): Marlowe Shores, PA-C Grant Fontana, MD)   Encounter Date: 08/03/2019   PT End of Session - 08/03/19 1017    Visit Number 3    Number of Visits 10    Authorization Type [employer], covered 100% as ded and OOPM met; VL:  30    PT Start Time 1017    PT Stop Time 1058    PT Time Calculation (min) 41 min    Equipment Utilized During Treatment Gait belt    Activity Tolerance Patient tolerated treatment well    Behavior During Therapy WFL for tasks assessed/performed           Past Medical History:  Diagnosis Date   Anxiety    situational   BPH (benign prostatic hyperplasia)    on flomax   Bundle branch block    per prior PCP records   CHF (congestive heart failure) ([place])    COPD (chronic obstructive pulmonary disease) ([place])    Diverticulosis    by colonoscopy   Eczema    per prior PCP records   Elevated PSA 2015   peaked 6s, s/p benign biopsy 2014, sees urology [name]   Essential tremor    GERD (gastroesophageal reflux disease)    per prior PCP records   History of panic attacks    per prior PCP records   HTN (hypertension)    Mild intermittent asthma in adult without complication    Obesity, Class I, BMI 30-34.9    Osteoporosis    DEXA 06/2013 with osteopenia - h/o ?wrist/hip fracture from AVN from chronic steroid use (asthma), took reclast for 1 year   Renal artery stenosis in 1 of 2 vessels ([place]) 2011   by [place]   Seasonal allergies    Sleep apnea    Thoracic scoliosis childhood   Transaminitis    per prior PCP records    Past Surgical History:  Procedure Laterality Date   APPLICATION OF WOUND VAC Left 05/09/2018   Procedure: Application Of Wound  Vac;  Surgeon: Newt Minion, MD;  Location: [facility];  Service: Orthopedics;  Laterality: Left;   BIOPSY  04/30/2019   Procedure: BIOPSY;  Surgeon: Juanita Craver, MD;  Location: [facility];  Service: Endoscopy;;   COLONOSCOPY  12/2013   polyps, int hem, diverticulosis, rpt 5 yrs [name])   COLONOSCOPY WITH PROPOFOL N/A 04/30/2019   Procedure: COLONOSCOPY WITH PROPOFOL;  Surgeon: Juanita Craver, MD;  Location: [facility];  Service: Endoscopy;  Laterality: N/A;   ELBOW SURGERY Right 2008   golfer's elbow   ESOPHAGOGASTRODUODENOSCOPY (EGD) WITH PROPOFOL N/A 04/30/2019   Procedure: ESOPHAGOGASTRODUODENOSCOPY (EGD) WITH PROPOFOL;  Surgeon: Juanita Craver, MD;  Location: [facility];  Service: Endoscopy;  Laterality: N/A;   INGUINAL HERNIA REPAIR Bilateral childhood & ~1995   LAPAROTOMY N/A 01/05/2019   Procedure: EXPLORATORY LAPAROTOMY graham patch repair;  Surgeon: Benjamine Sprague, DO;  Location: [facility];  Service: General;  Laterality: N/A;   NASAL SEPTUM SURGERY  2013   deviated septum   ORIF ANKLE FRACTURE Left 05/09/2018   Procedure: OPEN REDUCTION INTERNAL FIXATION LEFT LISFRANC FRACTURE/DISLOCATION;  Surgeon: Newt Minion, MD;  Location: [facility];  Service: Orthopedics;  Laterality: Left;   POLYPECTOMY  04/30/2019   Procedure: POLYPECTOMY;  Surgeon: [name],  Mar Daring, MD;  Location: [facility];  Service: Endoscopy;;   TEE WITHOUT CARDIOVERSION N/A 06/29/2019   Procedure: TRANSESOPHAGEAL ECHOCARDIOGRAM (TEE);  Surgeon: Acie Fredrickson Wonda Cheng, MD;  Location: [facility];  Service: Cardiovascular;  Laterality: N/A;   US ECHOCARDIOGRAPHY  02/2009   WNL, EF >55% Claiborne Billings)   US RENAL/AORTA Right 05/2009   1-59% diameter reduction renal artery, rec rpt 2 yrs    There were no vitals filed for this visit.   Subjective Assessment - 08/03/19 1020    Subjective Patient reports no new issues or complaints. No falls. No pain. Patient reports that HEP went well.    Pertinent History L foot fracture, sepsis with  septic shock, acute renal railure, OSA, GERD, anxiety, HTN, restrictive lung disease with scoliosis, neuropathy    Patient Stated Goals Pt's goal for therapy are for strengthening and endurance.    Currently in Pain? No/denies                             [facility] Adult PT Treatment/Exercise - 08/03/19 0001      Transfers   Transfers Sit to Stand;Stand to Sit    Sit to Stand 5: Supervision;With upper extremity assist;From bed;From chair/3-in-1    Stand to Sit 5: Supervision;With upper extremity assist;To bed;To chair/3-in-1    Comments completed 1 x 10 reps of sit <> stands with blue mat under BLE's to further challenge balance, patient require small rest break after 8 reps but stability wise looked well with completion      Ambulation/Gait   Ambulation/Gait Yes    Ambulation/Gait Assistance 5: Supervision    Ambulation/Gait Assistance Details throughout therapy session with activities    Assistive device None    Gait Pattern Step-through pattern;Wide base of support    Ambulation Surface Level;Indoor      Neuro Re-ed    Neuro Re-ed Details  In // bars on firm surface, completed alternating toe taps, 2 x 10 reps to cones. Initially completed with UE support and progressed to no UE support with completion.                Balance Exercises - 08/03/19 1052      Balance Exercises: Standing   Standing Eyes Opened Narrow base of support (BOS);Head turns;Foam/compliant surface;Limitations    Standing Eyes Opened Limitations completed 2 x 10 reps of horizontal/vertical head turns on complaint surfaces.     Standing Eyes Closed Narrow base of support (BOS);Foam/compliant surface;3 reps;30 secs;Limitations    Standing Eyes Closed Limitations completed with feet together and eyes closed, 3 x 30 seconds.    Tandem Stance Eyes open;3 reps;30 secs;Limitations    Tandem Stance Time completed alternating BLE, 20-30 seconds    Stepping Strategy Anterior;Foam/compliant  surface;Limitations    Stepping Strategy Limitations completing alternating forward stepping strategy off blue balance beam with intermittent UE support    Balance Beam completed static standing on balance beam with wide BOS and eyes open, 3 x 30 seconds    Tandem Gait Forward;Intermittent upper extremity support;3 reps;Limitations    Tandem Gait Limitations completed x 3 laps, down and back in // bars with intermittent CGA and touch from // bars as needed.              PT Education - 08/03/19 1100    Education Details HEP Update    Person(s) Educated Patient    Methods Explanation;Demonstration;Handout    Comprehension Verbalized understanding;Returned demonstration  PT Long Term Goals - 07/27/19 0909      PT LONG TERM GOAL #1   Title Pt will be independent with HEP for improved strength, balance, transfers, and gait.  TARGET 08/28/2019 (may be extended 1 additional week, due to pt out of town)    Time 5    Period Weeks    Status New      PT LONG TERM GOAL #2   Title Pt will improve 5x sit<>stand to less than or equal to 20 sec to demonstrate improved functional strength and transfer efficiency.    Baseline 29.87 sec    Time 5    Period Weeks    Status New      PT LONG TERM GOAL #3   Title Pt will improve DGI score to at least 20/24 to decrease fall risk.    Baseline 17/24    Time 5    Period Weeks    Status New      PT LONG TERM GOAL #4   Title Pt will improve distance ambulated in 3 MWT to at least 610 ft for improved gait efficiency with community gait.    Baseline 563 ft in 3 minutes    Time 5    Period Weeks    Status New      PT LONG TERM GOAL #5   Title Pt stand at least 30 seconds EC on foam, demonstrating improved vestibular input/use for balance.    Baseline 10 sec foam, EC with min guard and incr. sway    Time 5    Period Weeks    Status New      Additional Long Term Goals   Additional Long Term Goals Yes      PT LONG TERM GOAL #6    Title Pt will verbalize understanding of fall prevention in home environment.    Time 5    Period Weeks    Status New                 Plan - 08/03/19 1150    Clinical Impression Statement Today's skilled PT session included progression of balance exercises as tolerated by patient. Patient able to complete session with minimal rest breaks. Educated on HEP update. Patient and will continue to benefit from skilled PT services to progress toward all goals.    Personal Factors and Comorbidities Comorbidity 3+    Comorbidities history of familial tremors, restrictive lung disease with scoliosis, perforated duodenal ulcer, hepatic steatosis, moderate to excessive alcohol use, left Lisfranc fracture with dislocations s/p ORIF with onset of neuropathy, B12 deficiency, gait disorder with history of falls; R 5th metatarsal fx s/p fall 06/2019    Examination-Activity Limitations Locomotion Level;Transfers;Stand;Stairs    Examination-Participation Restrictions Church;Community Activity;Other   Fly fishing, golf, pilates   Stability/Clinical Decision Making Evolving/Moderate complexity    Rehab Potential Good    PT Frequency Other (comment)   1x/wk for 1 week, then 2x/wk for 4 weeks   PT Duration Other (comment)   5 weeks total POC   PT Treatment/Interventions ADLs/Self Care Home Management;Gait training;Stair training;Functional mobility training;Therapeutic activities;Therapeutic exercise;Balance training;Neuromuscular re-education;Patient/family education    PT Next Visit Plan work on activity tolerance, LE strengtheing and balance reactions on compliant sufaces with/without vision removed.    Consulted and Agree with Plan of Care Patient           Patient will benefit from skilled therapeutic intervention in order to improve the following deficits and impairments:  Abnormal gait,  Difficulty walking, Decreased endurance, Decreased activity tolerance, Decreased balance, Decreased mobility,  Decreased strength, Postural dysfunction  Visit Diagnosis: Other abnormalities of gait and mobility  Unsteadiness on feet  Muscle weakness (generalized)     Problem List Patient Active Problem List   Diagnosis Date Noted   Neuropathy 07/20/2019   Hypomagnesemia    Macrocytic anemia    Bacteremia    Debility 07/07/2019   Acute respiratory failure with hypoxia (HCC)    Atelectasis    Enterococcal bacteremia 06/26/2019   AKI (acute kidney injury) ([place]) 06/25/2019   Perforated viscus 01/06/2019   Severe sepsis with septic shock ([place]) 01/06/2019   Acute renal failure ([place]) 01/06/2019   Hyperkalemia 01/06/2019   Hyponatremia 01/06/2019   Lactic acidosis 01/06/2019   Onychomycosis 05/19/2018   Foot fracture, left, closed, initial encounter 05/09/2018   Lisfranc dislocation, left, initial encounter    Acute bronchitis 02/25/2018   Primary pulmonary hypertension ([place]) 52/17/4715   Diastolic dysfunction 95/39/6728   OSA (obstructive sleep apnea) 07/08/2017   SOB (shortness of breath) 07/02/2017   Chronic respiratory failure with hypoxia and hypercapnia ([place]) 07/02/2017   Cardiomegaly 07/02/2017   Exposure keratitis 02/17/2017   Pedal edema 12/18/2016   Transaminitis 12/18/2016   Bilateral pes planus 12/18/2016   Psoriasis-eczema overlap condition 07/30/2016   High serum high density lipoprotein (HDL) 09/26/2015   Epistaxis 09/26/2015   Renal artery stenosis in 1 of 2 vessels (HCC)    Essential tremor    Elevated PSA    GERD (gastroesophageal reflux disease)    Health maintenance examination 10/08/2014   Anxiety state 10/08/2014   Obesity, Class I, BMI 30-34.9    Mild intermittent asthma in adult without complication    Seasonal allergies    HTN (hypertension)    Scoliosis    Osteoporosis     Jones Bales, PT, DPT 08/03/2019, 11:56 AM  [facility] [address]  [place], [place], [address] Phone: [phone]   Fax:  [phone]  Name: Louis [name] MRN: [id number] Date of Birth: Jan 19, 1955

## 2019-08-03 NOTE — Telephone Encounter (Signed)
Results for orders placed or performed in visit on 08/03/19 (from the past 24 hour(s))  POCT CBC     Status: Abnormal   Collection Time: 08/03/19 11:32 AM  Result Value Ref Range   WBC 8.1 4.6 - 10.2 K/uL   Lymph, poc 1.9 0.6 - 3.4   POC LYMPH PERCENT 23.9 10 - 50 %L   MID (cbc) 0.5 0 - 0.9   POC MID % 6.6 0 - 12 %M   POC Granulocyte 5.6 2 - 6.9   Granulocyte percent 69.5 37 - 80 %G   RBC 3.24 (A) 4.69 - 6.13 M/uL   Hemoglobin 10.4 (A) 11 - 14.6 g/dL   HCT, POC 32.4 29 - 41 %   MCV 99.9 76 - 111 fL   MCH, POC 32.0 (A) 27 - 31.2 pg   MCHC 32.1 31.8 - 35.4 g/dL   RDW, POC 14.5 %   Platelet Count, POC 438 (A) 142 - 424 K/uL   MPV 6.2 0 - 99.8 fL    CBC Latest Ref Rng & Units 08/03/2019 07/13/2019 07/08/2019  WBC 4.6 - 10.2 K/uL 8.1 4.2 5.9  Hemoglobin 11 - 14.6 g/dL 10.4(A) 9.3(L) 9.5(L)  Hematocrit 29 - 41 % 32.4 29.3(L) 29.3(L)  Platelets 150 - 400 K/uL - 355 438(H)

## 2019-08-03 NOTE — Telephone Encounter (Signed)
Pt came to into the office today for repeat CBC labs.

## 2019-08-03 NOTE — Patient Instructions (Signed)
Access Code: [email or web address] URL: [email or web address] Date: 08/03/2019 Prepared by: Baldomero Lamy  Exercises Sit to Stand with Arms Crossed - 1 x daily - 5 x weekly - 1 sets - 5-10 reps Heel Toe Raises with Counter Support - 1 x daily - 5 x weekly - 1 sets - 10 reps Walking March - 1 x daily - 5 x weekly - 1 sets - 3 reps Tandem Walking with Counter Support - 1 x daily - 5 x weekly - 1 sets - 3 reps Standing Near Stance in Corner with Eyes Closed - 1 x daily - 5 x weekly - 1 sets - 3 reps - 30 hold Standing Balance in Corner with Eyes Closed - 1 x daily - 5 x weekly - 1 sets - 10 reps Tandem Stance with Eyes Closed in Corner - 1 x daily - 5 x weekly - 1 sets - 3 reps - 30 hold Romberg Stance with Head Nods - 1 x daily - 5 x weekly - 2 sets - 10 reps

## 2019-08-04 NOTE — Telephone Encounter (Signed)
Pt stated he was able to see them online and wanted to know when you wanted to re-check , specifically for the hemoglobin and any out of range levels.

## 2019-08-04 NOTE — Telephone Encounter (Signed)
I would say in 3 months. thanks

## 2019-08-05 ENCOUNTER — Ambulatory Visit: Payer: 59 | Admitting: Physical Therapy

## 2019-08-05 ENCOUNTER — Encounter: Payer: Self-pay | Admitting: Physical Therapy

## 2019-08-05 ENCOUNTER — Other Ambulatory Visit: Payer: Self-pay

## 2019-08-05 DIAGNOSIS — M6281 Muscle weakness (generalized): Secondary | ICD-10-CM

## 2019-08-05 DIAGNOSIS — R2681 Unsteadiness on feet: Secondary | ICD-10-CM

## 2019-08-05 DIAGNOSIS — R2689 Other abnormalities of gait and mobility: Secondary | ICD-10-CM

## 2019-08-05 NOTE — Therapy (Signed)
[facility] [address] [place], [place], [address] Phone: [phone]   Fax:  [phone]  Physical Therapy Treatment  Patient Details  Name: Louis [name] MRN: [id number] Date of Birth: [birth year] Referring Provider (PT): Marlowe Shores, PA-C Grant Fontana, MD)   Encounter Date: 08/05/2019   PT End of Session - 08/05/19 0806    Visit Number 4    Number of Visits 10    Authorization Type [employer], covered 100% as ded and OOPM met; VL:  30    PT Start Time 0804    PT Stop Time 0845    PT Time Calculation (min) 41 min    Equipment Utilized During Treatment Gait belt    Activity Tolerance Patient tolerated treatment well    Behavior During Therapy WFL for tasks assessed/performed           Past Medical History:  Diagnosis Date  . Anxiety    situational  . BPH (benign prostatic hyperplasia)    on flomax  . Bundle branch block    per prior PCP records  . CHF (congestive heart failure) ([place])   . COPD (chronic obstructive pulmonary disease) ([place])   . Diverticulosis    by colonoscopy  . Eczema    per prior PCP records  . Elevated PSA 2015   peaked 6s, s/p benign biopsy 2014, sees urology [name] ([place])  . Essential tremor   . GERD (gastroesophageal reflux disease)    per prior PCP records  . History of panic attacks    per prior PCP records  . HTN (hypertension)   . Mild intermittent asthma in adult without complication   . Obesity, Class I, BMI 30-34.9   . Osteoporosis    DEXA 06/2013 with osteopenia - h/o ?wrist/hip fracture from AVN from chronic steroid use (asthma), took reclast for 1 year  . Renal artery stenosis in 1 of 2 vessels ([place]) 2011   by [place]  . Seasonal allergies   . Sleep apnea   . Thoracic scoliosis childhood  . Transaminitis    per prior PCP records    Past Surgical History:  Procedure Laterality Date  . APPLICATION OF WOUND VAC Left 05/09/2018   Procedure: Application Of Wound  Vac;  Surgeon: Newt Minion, MD;  Location: [facility];  Service: Orthopedics;  Laterality: Left;  . BIOPSY  04/30/2019   Procedure: BIOPSY;  Surgeon: Juanita Craver, MD;  [facility];  Service: Endoscopy;;  . COLONOSCOPY  12/2013   polyps, int hem, diverticulosis, rpt 5 yrs ([place])  . COLONOSCOPY WITH PROPOFOL N/A 04/30/2019   Procedure: COLONOSCOPY WITH PROPOFOL;  Surgeon: Juanita Craver, MD;  [facility];  Service: Endoscopy;  Laterality: N/A;  . ELBOW SURGERY Right 2008   golfer's elbow  . ESOPHAGOGASTRODUODENOSCOPY (EGD) WITH PROPOFOL N/A 04/30/2019   Procedure: ESOPHAGOGASTRODUODENOSCOPY (EGD) WITH PROPOFOL;  Surgeon: Juanita Craver, MD;  [facility];  Service: Endoscopy;  Laterality: N/A;  . INGUINAL HERNIA REPAIR Bilateral childhood & ~1995  . LAPAROTOMY N/A 01/05/2019   Procedure: EXPLORATORY LAPAROTOMY graham patch repair;  Surgeon: Benjamine Sprague, DO;  Location: [facility];  Service: General;  Laterality: N/A;  . NASAL SEPTUM SURGERY  2013   deviated septum  . ORIF ANKLE FRACTURE Left 05/09/2018   Procedure: OPEN REDUCTION INTERNAL FIXATION LEFT LISFRANC FRACTURE/DISLOCATION;  Surgeon: Newt Minion, MD;  Location: [facility];  Service: Orthopedics;  Laterality: Left;  . POLYPECTOMY  04/30/2019   Procedure: POLYPECTOMY;  Surgeon: [name],  Mar Daring, MD;  [facility];  Service: Endoscopy;;  . TEE WITHOUT CARDIOVERSION N/A 06/29/2019   Procedure: TRANSESOPHAGEAL ECHOCARDIOGRAM (TEE);  Surgeon: Acie Fredrickson Wonda Cheng, MD;  [facility];  Service: Cardiovascular;  Laterality: N/A;  . US ECHOCARDIOGRAPHY  02/2009   WNL, EF >55% Claiborne Billings)  . US RENAL/AORTA Right 05/2009   1-59% diameter reduction renal artery, rec rpt 2 yrs    There were no vitals filed for this visit.   Subjective Assessment - 08/05/19 0805    Subjective No new complaints. No falls to report. HEP is going well. Lower back is sore from doing some Pilates yesterday.    Pertinent History L foot fracture,  sepsis with septic shock, acute renal railure, OSA, GERD, anxiety, HTN, restrictive lung disease with scoliosis, neuropathy    Currently in Pain? No/denies    Pain Score 0-No pain                   [facility] Adult PT Treatment/Exercise - 08/05/19 0807      Transfers   Transfers Sit to Stand;Stand to Sit    Sit to Stand 5: Supervision;With upper extremity assist;From bed;From chair/3-in-1    Stand to Sit 5: Supervision;With upper extremity assist;To bed;To chair/3-in-1      Ambulation/Gait   Ambulation/Gait Yes    Ambulation/Gait Assistance 5: Supervision    Ambulation/Gait Assistance Details around gym with session.     Assistive device None    Gait Pattern Step-through pattern;Wide base of support    Ambulation Surface Level;Indoor      Exercises   Exercises Other Exercises    Other Exercises  Scifit UE/LE's level 3.5 for 8 minutes with goal >/= 70 rpm for strengthening and activity tolerance               Balance Exercises - 08/05/19 0826      Balance Exercises: Standing   Standing Eyes Closed Wide (BOA);Foam/compliant surface;Other reps (comment);30 secs;Limitations;Head turns    Standing Eyes Closed Limitations standing across blue foam beam: feet hip width apart for EC no head movements for 30 sec's, 3 reps, progressing to EC head movements left<>right, then up<>down for 10 reps each. min guard to min assist for balance with cues on posture and weight shifting for balance assist.     Rockerboard Lateral;Anterior/posterior;EO;EC;30 seconds;10 reps;Limitations    Rockerboard Limitations performed both ways on balance board- rocking the board with EO progressing to [facility] with emphasis on tall posture, no UE support with min guard to min assist; Then holding the board steady for EC no head movements for 30 sec's, 3 reps. min guard to min assist with no UE support, cues on posture to assist with balance.     Balance Beam standing across blue foam beam: alternating fwd stepping  to floor/back to beam, then alternating bwd steppping to floor/back onto beam for 10 reps each with light UE to no UE support on bars. cues needed for increased step length and height. min guard to min assist for balance.     Tandem Gait Forward;Retro;Intermittent upper extremity support;Foam/compliant surface;3 reps;Limitations    Tandem Gait Limitations on blue foam beam for 3 laps each way with cues for step placment, light UE support on bars.     Sidestepping Foam/compliant support;3 reps;Limitations    Sidestepping Limitations on blue foam beam for 4 laps each way with cues on step length and posture. intermittent touch to bars for balance assistance.  PT Long Term Goals - 07/27/19 0909      PT LONG TERM GOAL #1   Title Pt will be independent with HEP for improved strength, balance, transfers, and gait.  TARGET 08/28/2019 (may be extended 1 additional week, due to pt out of town)    Time 5    Period Weeks    Status New      PT LONG TERM GOAL #2   Title Pt will improve 5x sit<>stand to less than or equal to 20 sec to demonstrate improved functional strength and transfer efficiency.    Baseline 29.87 sec    Time 5    Period Weeks    Status New      PT LONG TERM GOAL #3   Title Pt will improve DGI score to at least 20/24 to decrease fall risk.    Baseline 17/24    Time 5    Period Weeks    Status New      PT LONG TERM GOAL #4   Title Pt will improve distance ambulated in 3 MWT to at least 610 ft for improved gait efficiency with community gait.    Baseline 563 ft in 3 minutes    Time 5    Period Weeks    Status New      PT LONG TERM GOAL #5   Title Pt stand at least 30 seconds EC on foam, demonstrating improved vestibular input/use for balance.    Baseline 10 sec foam, EC with min guard and incr. sway    Time 5    Period Weeks    Status New      Additional Long Term Goals   Additional Long Term Goals Yes      PT LONG TERM GOAL #6   Title Pt  will verbalize understanding of fall prevention in home environment.    Time 5    Period Weeks    Status New                 Plan - 08/05/19 0806    Clinical Impression Statement Today's skilled session continued to focus on activity tolerance, strengthening and balance reactions with rest breaks taken as needed. No other issues reported or noted in session. The pt is progressing toward goals and should benefit from continued PT to progress toward unmet goals.    Personal Factors and Comorbidities Comorbidity 3+    Comorbidities history of familial tremors, restrictive lung disease with scoliosis, perforated duodenal ulcer, hepatic steatosis, moderate to excessive alcohol use, left Lisfranc fracture with dislocations s/p ORIF with onset of neuropathy, B12 deficiency, gait disorder with history of falls; R 5th metatarsal fx s/p fall 06/2019    Examination-Activity Limitations Locomotion Level;Transfers;Stand;Stairs    Examination-Participation Restrictions Church;Community Activity;Other   Fly fishing, golf, pilates   Stability/Clinical Decision Making Evolving/Moderate complexity    Rehab Potential Good    PT Frequency Other (comment)   1x/wk for 1 week, then 2x/wk for 4 weeks   PT Duration Other (comment)   5 weeks total POC   PT Treatment/Interventions ADLs/Self Care Home Management;Gait training;Stair training;Functional mobility training;Therapeutic activities;Therapeutic exercise;Balance training;Neuromuscular re-education;Patient/family education    PT Next Visit Plan work on activity tolerance, LE strengtheing and balance reactions on compliant sufaces with/without vision removed.    Consulted and Agree with Plan of Care Patient           Patient will benefit from skilled therapeutic intervention in order to improve the following deficits and impairments:  Abnormal gait, Difficulty walking, Decreased endurance, Decreased activity tolerance, Decreased balance, Decreased mobility,  Decreased strength, Postural dysfunction  Visit Diagnosis: Other abnormalities of gait and mobility  Unsteadiness on feet  Muscle weakness (generalized)     Problem List Patient Active Problem List   Diagnosis Date Noted  . Neuropathy 07/20/2019  . Hypomagnesemia   . Macrocytic anemia   . Bacteremia   . Debility 07/07/2019  . Acute respiratory failure with hypoxia ([place])   . Atelectasis   . Enterococcal bacteremia 06/26/2019  . AKI (acute kidney injury) ([place]) 06/25/2019  . Perforated viscus 01/06/2019  . Severe sepsis with septic shock ([place]) 01/06/2019  . Acute renal failure ([place]) 01/06/2019  . Hyperkalemia 01/06/2019  . Hyponatremia 01/06/2019  . Lactic acidosis 01/06/2019  . Onychomycosis 05/19/2018  . Foot fracture, left, closed, initial encounter 05/09/2018  . Lisfranc dislocation, left, initial encounter   . Acute bronchitis 02/25/2018  . Primary pulmonary hypertension ([place]) 07/09/2017  . Diastolic dysfunction 77/41/2878  . OSA (obstructive sleep apnea) 07/08/2017  . SOB (shortness of breath) 07/02/2017  . Chronic respiratory failure with hypoxia and hypercapnia ([place]) 07/02/2017  . Cardiomegaly 07/02/2017  . Exposure keratitis 02/17/2017  . Pedal edema 12/18/2016  . Transaminitis 12/18/2016  . Bilateral pes planus 12/18/2016  . Psoriasis-eczema overlap condition 07/30/2016  . High serum high density lipoprotein (HDL) 09/26/2015  . Epistaxis 09/26/2015  . Renal artery stenosis in 1 of 2 vessels ([place])   . Essential tremor   . Elevated PSA   . GERD (gastroesophageal reflux disease)   . Health maintenance examination 10/08/2014  . Anxiety state 10/08/2014  . Obesity, Class I, BMI 30-34.9   . Mild intermittent asthma in adult without complication   . Seasonal allergies   . HTN (hypertension)   . Scoliosis   . Osteoporosis     Willow Ora, PTA, [facility] [address], [place], [place] [address] [phone] 08/05/19, 5:02  PM   Name: Louis [name] MRN: [id number] Date of Birth: [birth year]

## 2019-08-05 NOTE — Telephone Encounter (Signed)
Attempted to call pt to relay provider message. No answer.

## 2019-08-05 NOTE — Telephone Encounter (Signed)
Called patient set him up for nurses visit

## 2019-08-07 ENCOUNTER — Encounter: Payer: Self-pay | Admitting: Neurology

## 2019-08-07 ENCOUNTER — Other Ambulatory Visit: Payer: Self-pay

## 2019-08-07 ENCOUNTER — Telehealth: Payer: Self-pay | Admitting: Acute Care

## 2019-08-07 ENCOUNTER — Ambulatory Visit (INDEPENDENT_AMBULATORY_CARE_PROVIDER_SITE_OTHER): Payer: 59 | Admitting: Neurology

## 2019-08-07 VITALS — BP 119/71 | HR 78 | Ht 71.0 in | Wt 208.0 lb

## 2019-08-07 DIAGNOSIS — G603 Idiopathic progressive neuropathy: Secondary | ICD-10-CM

## 2019-08-07 DIAGNOSIS — G250 Essential tremor: Secondary | ICD-10-CM | POA: Diagnosis not present

## 2019-08-07 DIAGNOSIS — G4733 Obstructive sleep apnea (adult) (pediatric): Secondary | ICD-10-CM

## 2019-08-07 NOTE — Progress Notes (Signed)
Reason for visit: Peripheral neuropathy, essential tremor  Referring physician: [facility] Louis [name] is a 64 y.o. male  History of present illness:  [name] is a 64 year old right-handed white male with a history of a chronic tremor felt to be an essential tremor.  The patient was admitted to the hospital on 25 June 2019 with sepsis from Enterococcus.  The patient had acute renal failure, he had flash pulmonary edema and respiratory arrest requiring intubation.  The patient had a toxic metabolic encephalopathy with significant confusion.  He required inpatient rehab and he is getting physical therapy as an outpatient currently.  He has made excellent strides in physical abilities.  He still has significant problems with gait instability.  He had a fall in May 2020 fracturing the left foot that required surgery.  The patient may have had some numbness in the feet prior to the fall, but the issues have become more prominent recently.  The patient feels numb in the bottom of the feet bilaterally, he may have some discomfort in the feet in the evening hours and some occasional lancinating pains in the legs as well.  He denies any numbness or weakness of the hands.  The has not had any further falls since coming out of the hospital.  He denies issues controlling the bowels or the bladder.  He is not having any significant neck pain or low back pain.  He does have a history of a vitamin B12 deficiency that was picked up in the hospital with a level 237, he is on oral supplementation currently.  He had been drinking alcohol on a regular basis prior to going in the hospital but he has not had alcohol since coming out.  He is on gabapentin for the foot and leg discomfort.  He gets benefit with this.  He comes here for further evaluation.  Past Medical History:  Diagnosis Date  . Anxiety    situational  . BPH (benign prostatic hyperplasia)    on flomax  . Bundle branch block    per prior  PCP records  . CHF (congestive heart failure) ([place])   . COPD (chronic obstructive pulmonary disease) ([place])   . Diverticulosis    by colonoscopy  . Eczema    per prior PCP records  . Elevated PSA 2015   peaked 6s, s/p benign biopsy 2014, sees urology [name] ([place])  . Essential tremor   . GERD (gastroesophageal reflux disease)    per prior PCP records  . History of panic attacks    per prior PCP records  . HTN (hypertension)   . Mild intermittent asthma in adult without complication   . Obesity, Class I, BMI 30-34.9   . Osteoporosis    DEXA 06/2013 with osteopenia - h/o ?wrist/hip fracture from AVN from chronic steroid use (asthma), took reclast for 1 year  . Renal artery stenosis in 1 of 2 vessels ([place]) 2011   by [place]  . Seasonal allergies   . Sleep apnea   . Thoracic scoliosis childhood  . Transaminitis    per prior PCP records    Past Surgical History:  Procedure Laterality Date  . APPLICATION OF WOUND VAC Left 05/09/2018   Procedure: Application Of Wound Vac;  Surgeon: Newt Minion, MD;  Location: [facility];  Service: Orthopedics;  Laterality: Left;  . BIOPSY  04/30/2019   Procedure: BIOPSY;  Surgeon: Juanita Craver, MD;  [facility];  Service: Endoscopy;;  . COLONOSCOPY  12/2013   polyps, int hem, diverticulosis, rpt 5 yrs ([place])  . COLONOSCOPY WITH PROPOFOL N/A 04/30/2019   Procedure: COLONOSCOPY WITH PROPOFOL;  Surgeon: Juanita Craver, MD;  [facility];  Service: Endoscopy;  Laterality: N/A;  . ELBOW SURGERY Right 2008   golfer's elbow  . ESOPHAGOGASTRODUODENOSCOPY (EGD) WITH PROPOFOL N/A 04/30/2019   Procedure: ESOPHAGOGASTRODUODENOSCOPY (EGD) WITH PROPOFOL;  Surgeon: Juanita Craver, MD;  [facility];  Service: Endoscopy;  Laterality: N/A;  . INGUINAL HERNIA REPAIR Bilateral childhood & ~1995  . LAPAROTOMY N/A 01/05/2019   Procedure: EXPLORATORY LAPAROTOMY graham patch repair;  Surgeon: Benjamine Sprague, DO;  Location: [facility];  Service: General;   Laterality: N/A;  . NASAL SEPTUM SURGERY  2013   deviated septum  . ORIF ANKLE FRACTURE Left 05/09/2018   Procedure: OPEN REDUCTION INTERNAL FIXATION LEFT LISFRANC FRACTURE/DISLOCATION;  Surgeon: Newt Minion, MD;  Location: [facility];  Service: Orthopedics;  Laterality: Left;  . POLYPECTOMY  04/30/2019   Procedure: POLYPECTOMY;  Surgeon: Juanita Craver, MD;  [facility];  Service: Endoscopy;;  . TEE WITHOUT CARDIOVERSION N/A 06/29/2019   Procedure: TRANSESOPHAGEAL ECHOCARDIOGRAM (TEE);  Surgeon: Acie Fredrickson Wonda Cheng, MD;  [facility];  Service: Cardiovascular;  Laterality: N/A;  . US ECHOCARDIOGRAPHY  02/2009   WNL, EF >55% Claiborne Billings)  . US RENAL/AORTA Right 05/2009   1-59% diameter reduction renal artery, rec rpt 2 yrs    Family History  Problem Relation Age of Onset  . Cancer Father 60       colon  . Prostate cancer Father   . Diabetes Mother   . Alcohol abuse Mother   . Stroke Mother   . Cancer Maternal Grandmother        pancreatic  . Ataxia Brother   . Ataxia Sister   . CAD Neg Hx     Social history:  reports that he has never smoked. He has never used smokeless tobacco. He reports previous alcohol use of about 14.0 standard drinks of alcohol per week. He reports that he does not use drugs.  Medications:  Prior to Admission medications   Medication Sig Start Date End Date Taking? Authorizing Provider  ALPRAZolam Duanne Moron) 0.5 MG tablet Take 1 tablet (0.5 mg total) by mouth daily as needed (essential tremor). 07/17/19  Yes Love, Ivan Anchors, PA-C  Amino Acids-Protein Hydrolys (FEEDING SUPPLEMENT, PRO-STAT SUGAR FREE 64,) LIQD Take 30 mLs by mouth 3 (three) times daily with meals. 07/17/19  Yes Love, Ivan Anchors, PA-C  amLODipine (NORVASC) 5 MG tablet TAKE 1 TABLET(5 MG) BY MOUTH DAILY Patient taking differently: Take 5 mg by mouth daily.  12/22/18  Yes Troy Sine, MD  cyanocobalamin 1000 MCG tablet Take 1 tablet (1,000 mcg total) by mouth daily. Available over the counter  07/17/19  Yes Love, Ivan Anchors, PA-C  docusate sodium (COLACE) 100 MG capsule Take 1 capsule (100 mg total) by mouth 2 (two) times daily as needed for mild constipation. Available over the counter 07/17/19  Yes Love, Ivan Anchors, PA-C  folic acid (FOLVITE) 1 MG tablet Take 1 tablet (1 mg total) by mouth daily. 07/08/19  Yes Dahal, Marlowe Aschoff, MD  gabapentin (NEURONTIN) 300 MG capsule Take 1 capsule (300 mg total) by mouth 2 (two) times daily. 07/17/19  Yes Love, Ivan Anchors, PA-C  losartan (COZAAR) 100 MG tablet Take 1 tablet (100 mg total) by mouth daily. 07/17/19  Yes Love, Ivan Anchors, PA-C  magnesium oxide (MAG-OX) 400 (241.3 Mg) MG tablet Take 1 tablet (400  mg total) by mouth with breakfast, with lunch, and with evening meal. 07/17/19  Yes Love, Ivan Anchors, PA-C  metoprolol succinate (TOPROL-XL) 50 MG 24 hr tablet Take 1 tablet (50 mg total) by mouth daily. Take with a meal. 07/17/19  Yes Love, Ivan Anchors, PA-C  Multiple Vitamin (MULTIVITAMIN WITH MINERALS) TABS tablet Take 1 tablet by mouth daily. 07/08/19  Yes Dahal, Marlowe Aschoff, MD  omeprazole (PRILOSEC) 20 MG capsule Take 20 mg by mouth daily.   Yes [provider]  thiamine 100 MG tablet Take 1 tablet (100 mg total) by mouth daily. Available over the counter 07/17/19  Yes Love, Ivan Anchors, PA-C      Allergies  Allergen Reactions  . Accupril [Quinapril Hcl] Cough  . Nsaids     Perforated gastric ulcer    ROS:  Out of a complete 14 system review of symptoms, the patient complains only of the following symptoms, and all other reviewed systems are negative.  Foot numbness Walking difficulty Tremor  Blood pressure 119/71, pulse 78, height 5\' 11"  (1.803 m), weight 208 lb (94.3 kg).  Physical Exam  General: The patient is alert and cooperative at the time of the examination.  Eyes: Pupils are equal, round, and reactive to light. Discs are flat bilaterally.  Neck: The neck is supple, no carotid bruits are noted.  Respiratory: The respiratory examination  is clear.  Cardiovascular: The cardiovascular examination reveals a regular rate and rhythm, no obvious murmurs or rubs are noted.  Skin: Extremities are with 1+ edema below the knees bilaterally.  Neurologic Exam  Mental status: The patient is alert and oriented x 3 at the time of the examination. The patient has apparent normal recent and remote memory, with an apparently normal attention span and concentration ability.  Cranial nerves: Facial symmetry is present. There is good sensation of the face to pinprick and soft touch bilaterally. The strength of the facial muscles and the muscles to head turning and shoulder shrug are normal bilaterally. Speech is well enunciated, no aphasia or dysarthria is noted. Extraocular movements are full. Visual fields are full. The tongue is midline, and the patient has symmetric elevation of the soft palate. No obvious hearing deficits are noted.  Motor: The motor testing reveals 5 over 5 strength of all 4 extremities, with exception of some evidence of mild foot drop bilaterally. Good symmetric motor tone is noted throughout.  Sensory: Sensory testing is intact to pinprick and soft touch bilaterally.  Vibration sensation is decreased on the left foot as compared to the right and position sensation is significant decreased on the feet bilaterally.  Sensory evaluation on the arms is symmetric and normal.  No evidence of extinction was noted.  Coordination: Cerebellar testing reveals good finger-nose-finger and heel-to-shin bilaterally.  Gait and station: Gait is mildly wide-based.  Tandem gait is unsteady.  Romberg is negative but slightly unsteady.  The patient has difficulty walking on heels and the toes bilaterally.  Reflexes: Deep tendon reflexes are symmetric and normal bilaterally, with exception of decreased ankle jerk reflex bilaterally. Toes are downgoing bilaterally.   Assessment/Plan:  1.  Probable peripheral neuropathy  2.  Mild essential  tremor  3.  Gait disturbance  4.  History of vitamin B12 deficiency  The patient has a gait disturbance associated with decreased position sense in the feet.  The patient will be set up for further blood work today and will have nerve conduction studies on both legs, EMG on 1 leg.  He will  follow-up otherwise in 5 months.  He will continue on the gabapentin.  Jill Alexanders MD 08/07/2019 11:44 AM  [facility] [address] [place], [place] [address]  Phone [phone] Fax [phone]

## 2019-08-07 NOTE — Telephone Encounter (Signed)
Patient wanting to change DME company from [employer] to [employer].  Orders placed per patient request.

## 2019-08-10 ENCOUNTER — Encounter: Payer: Self-pay | Admitting: Physical Therapy

## 2019-08-10 ENCOUNTER — Ambulatory Visit: Payer: 59 | Admitting: Physical Therapy

## 2019-08-10 ENCOUNTER — Other Ambulatory Visit: Payer: Self-pay

## 2019-08-10 DIAGNOSIS — R2689 Other abnormalities of gait and mobility: Secondary | ICD-10-CM | POA: Diagnosis not present

## 2019-08-10 DIAGNOSIS — M6281 Muscle weakness (generalized): Secondary | ICD-10-CM

## 2019-08-10 NOTE — Patient Instructions (Addendum)

## 2019-08-10 NOTE — Therapy (Signed)
[facility] [address] [place], [place], [address] Phone: [phone]   Fax:  [phone]  Physical Therapy Treatment  Patient Details  Name: Louis [name] MRN: [id number] Date of Birth: Feb 12, 1955 Referring Provider (PT): Marlowe Shores, PA-C Grant Fontana, MD)   Encounter Date: 08/10/2019   PT End of Session - 08/10/19 1755    Visit Number 5    Number of Visits 10    Authorization Type [employer], covered 100% as ded and OOPM met; VL:  30    PT Start Time 0928    PT Stop Time 1010    PT Time Calculation (min) 42 min    Equipment Utilized During Treatment --    Activity Tolerance Patient tolerated treatment well    Behavior During Therapy WFL for tasks assessed/performed           Past Medical History:  Diagnosis Date   Anxiety    situational   BPH (benign prostatic hyperplasia)    on flomax   Bundle branch block    per prior PCP records   CHF (congestive heart failure) ([place])    COPD (chronic obstructive pulmonary disease) ([place])    Diverticulosis    by colonoscopy   Eczema    per prior PCP records   Elevated PSA 2015   peaked 6s, s/p benign biopsy 2014, sees urology [name]   Essential tremor    GERD (gastroesophageal reflux disease)    per prior PCP records   History of panic attacks    per prior PCP records   HTN (hypertension)    Mild intermittent asthma in adult without complication    Obesity, Class I, BMI 30-34.9    Osteoporosis    DEXA 06/2013 with osteopenia - h/o ?wrist/hip fracture from AVN from chronic steroid use (asthma), took reclast for 1 year   Renal artery stenosis in 1 of 2 vessels ([place]) 2011   by [place]   Seasonal allergies    Sleep apnea    Thoracic scoliosis childhood   Transaminitis    per prior PCP records    Past Surgical History:  Procedure Laterality Date   APPLICATION OF WOUND VAC Left 05/09/2018   Procedure: Application Of Wound Vac;   Surgeon: Newt Minion, MD;  Location: [facility];  Service: Orthopedics;  Laterality: Left;   BIOPSY  04/30/2019   Procedure: BIOPSY;  Surgeon: Juanita Craver, MD;  Location: [facility];  Service: Endoscopy;;   COLONOSCOPY  12/2013   polyps, int hem, diverticulosis, rpt 5 yrs [name])   COLONOSCOPY WITH PROPOFOL N/A 04/30/2019   Procedure: COLONOSCOPY WITH PROPOFOL;  Surgeon: Juanita Craver, MD;  Location: [facility];  Service: Endoscopy;  Laterality: N/A;   ELBOW SURGERY Right 2008   golfer's elbow   ESOPHAGOGASTRODUODENOSCOPY (EGD) WITH PROPOFOL N/A 04/30/2019   Procedure: ESOPHAGOGASTRODUODENOSCOPY (EGD) WITH PROPOFOL;  Surgeon: Juanita Craver, MD;  Location: [facility];  Service: Endoscopy;  Laterality: N/A;   INGUINAL HERNIA REPAIR Bilateral childhood & ~1995   LAPAROTOMY N/A 01/05/2019   Procedure: EXPLORATORY LAPAROTOMY graham patch repair;  Surgeon: Benjamine Sprague, DO;  Location: [facility];  Service: General;  Laterality: N/A;   NASAL SEPTUM SURGERY  2013   deviated septum   ORIF ANKLE FRACTURE Left 05/09/2018   Procedure: OPEN REDUCTION INTERNAL FIXATION LEFT LISFRANC FRACTURE/DISLOCATION;  Surgeon: Newt Minion, MD;  Location: [facility];  Service: Orthopedics;  Laterality: Left;   POLYPECTOMY  04/30/2019   Procedure: POLYPECTOMY;  Surgeon: Juanita Craver,  MD;  Location: [facility];  Service: Endoscopy;;   TEE WITHOUT CARDIOVERSION N/A 06/29/2019   Procedure: TRANSESOPHAGEAL ECHOCARDIOGRAM (TEE);  Surgeon: Acie Fredrickson Wonda Cheng, MD;  Location: [facility];  Service: Cardiovascular;  Laterality: N/A;   US ECHOCARDIOGRAPHY  02/2009   WNL, EF >55% Claiborne Billings)   US RENAL/AORTA Right 05/2009   1-59% diameter reduction renal artery, rec rpt 2 yrs    There were no vitals filed for this visit.   Subjective Assessment - 08/10/19 0926    Subjective Doing the exercises at home; think I'm making small progress in balance.  I know I'm being more careful and more aware of my balance.  Still walking around  the neighborhood.    Pertinent History L foot fracture, sepsis with septic shock, acute renal railure, OSA, GERD, anxiety, HTN, restrictive lung disease with scoliosis, neuropathy    Patient Stated Goals Pt's goal for therapy are for strengthening and endurance.    Currently in Pain? No/denies                             [facility] Adult PT Treatment/Exercise - 08/10/19 0939      Transfers   Transfers Sit to Stand;Stand to Sit    Sit to Stand 5: Supervision;With upper extremity assist;From bed;From chair/3-in-1    Stand to Sit 5: Supervision;With upper extremity assist;To bed;To chair/3-in-1    Number of Reps 2 sets;Other reps (comment)   5 reps-from mat, then from low surface BOSU   Transfer Cueing On low surface, simulating sofa, PT provides cues for scooting to edge, increased forward lean/rocking for momentum, and hand placement if needed.      Ambulation/Gait   Ambulation/Gait Yes    Ambulation/Gait Assistance 5: Supervision    Ambulation/Gait Assistance Details Trial of single walking pole x 200 ft, then bilateral walking poles x 200 ft, with verbal cues for correct sequence of poles.  Pt to be going to [place] with family and educated pt on how he could use these on unlevel terrain for additional support, instead of rollator.    Ambulation Distance (Feet) 200 Feet   x 2   Assistive device None;Other (Comment)   bilat walking poles   Gait Pattern Step-through pattern;Wide base of support    Ambulation Surface Level;Indoor    Gait Comments Pt reports walking one mile in neighborhood using rollator; discussed ways he could work on walking distance without device:  since he walks with wife-have wife walk with rollator for short distance while pt uses no device.  Use 1 or both walking poles for neighborhood walks, starting with short distances, and gradually increasing distance.      Self-Care   Self-Care Other Self-Care Comments    Other Self-Care Comments  Provided and  discussed fall prevention education-in relation to his home and to home where he will be visiting with family next week.      Exercises   Exercises Knee/Hip      Knee/Hip Exercises: Standing   Forward Step Up Right;Left;2 sets;10 reps;Hand Hold: 2;Step Height: 6"    Forward Step Up Limitations step up-up, down-down x 10 reps, then single limb step up x 10 reps , 3 sec hold with 1 UE support    Step Down Right;Left;10 reps;Hand Hold: 2;Step Height: 6";1 set    Functional Squat 1 set;10 reps    Wall Squat 1 set;10 reps    Wall Squat Limitations Chair support at one side  Other Standing Knee Exercises Resisted sidestepping, 4 laps R and L at counter, with green theraband, progressed to sidestep squats x 2 laps R and L at counter with green band.                  PT Education - 08/10/19 1010    Education Details Fall prevention education    Person(s) Educated Patient    Methods Explanation;Handout    Comprehension Verbalized understanding               PT Long Term Goals - 07/27/19 0909      PT LONG TERM GOAL #1   Title Pt will be independent with HEP for improved strength, balance, transfers, and gait.  TARGET 08/28/2019 (may be extended 1 additional week, due to pt out of town)    Time 5    Period Weeks    Status New      PT LONG TERM GOAL #2   Title Pt will improve 5x sit<>stand to less than or equal to 20 sec to demonstrate improved functional strength and transfer efficiency.    Baseline 29.87 sec    Time 5    Period Weeks    Status New      PT LONG TERM GOAL #3   Title Pt will improve DGI score to at least 20/24 to decrease fall risk.    Baseline 17/24    Time 5    Period Weeks    Status New      PT LONG TERM GOAL #4   Title Pt will improve distance ambulated in 3 MWT to at least 610 ft for improved gait efficiency with community gait.    Baseline 563 ft in 3 minutes    Time 5    Period Weeks    Status New      PT LONG TERM GOAL #5   Title Pt  stand at least 30 seconds EC on foam, demonstrating improved vestibular input/use for balance.    Baseline 10 sec foam, EC with min guard and incr. sway    Time 5    Period Weeks    Status New      Additional Long Term Goals   Additional Long Term Goals Yes      PT LONG TERM GOAL #6   Title Pt will verbalize understanding of fall prevention in home environment.    Time 5    Period Weeks    Status New                 Plan - 08/10/19 1757    Clinical Impression Statement Fall prevention education provided today, as pt is preparing to take a week-long trip to [place] with family; given unlevel terrain and unfamiliar surroundings, fall prevention education discussed.  In addition, gait training with one and two walking poles (pt has these at home already, but has not used in a while), again, for education on additional support on uneven terrains on his trip and for transition to outdoor gait without rollator.  Pt will continue to benefit from skilled PT to further address balance, strength, gait for improved functional mobility.    Personal Factors and Comorbidities Comorbidity 3+    Comorbidities history of familial tremors, restrictive lung disease with scoliosis, perforated duodenal ulcer, hepatic steatosis, moderate to excessive alcohol use, left Lisfranc fracture with dislocations s/p ORIF with onset of neuropathy, B12 deficiency, gait disorder with history of falls; R 5th metatarsal fx s/p fall 06/2019  Examination-Activity Limitations Locomotion Level;Transfers;Stand;Stairs    Examination-Participation Restrictions Church;Community Activity;Other   Fly fishing, golf, pilates   Stability/Clinical Decision Making Evolving/Moderate complexity    Rehab Potential Good    PT Frequency Other (comment)   1x/wk for 1 week, then 2x/wk for 4 weeks   PT Duration Other (comment)   5 weeks total POC   PT Treatment/Interventions ADLs/Self Care Home Management;Gait training;Stair  training;Functional mobility training;Therapeutic activities;Therapeutic exercise;Balance training;Neuromuscular re-education;Patient/family education    PT Next Visit Plan Balance on incline/DECLINE surfaces.  If not too hot outside, work on walking poles on hills.  Balance work on compliant surfaces-EO and EC for improved balance.    Consulted and Agree with Plan of Care Patient           Patient will benefit from skilled therapeutic intervention in order to improve the following deficits and impairments:  Abnormal gait, Difficulty walking, Decreased endurance, Decreased activity tolerance, Decreased balance, Decreased mobility, Decreased strength, Postural dysfunction  Visit Diagnosis: Other abnormalities of gait and mobility  Muscle weakness (generalized)     Problem List Patient Active Problem List   Diagnosis Date Noted   Neuropathy 07/20/2019   Hypomagnesemia    Macrocytic anemia    Bacteremia    Debility 07/07/2019   Acute respiratory failure with hypoxia (HCC)    Atelectasis    Enterococcal bacteremia 06/26/2019   AKI (acute kidney injury) ([place]) 06/25/2019   Perforated viscus 01/06/2019   Severe sepsis with septic shock ([place]) 01/06/2019   Acute renal failure ([place]) 01/06/2019   Hyperkalemia 01/06/2019   Hyponatremia 01/06/2019   Lactic acidosis 01/06/2019   Onychomycosis 05/19/2018   Foot fracture, left, closed, initial encounter 05/09/2018   Lisfranc dislocation, left, initial encounter    Acute bronchitis 02/25/2018   Primary pulmonary hypertension ([place]) 86/76/7209   Diastolic dysfunction 47/09/6281   OSA (obstructive sleep apnea) 07/08/2017   SOB (shortness of breath) 07/02/2017   Chronic respiratory failure with hypoxia and hypercapnia ([place]) 07/02/2017   Cardiomegaly 07/02/2017   Exposure keratitis 02/17/2017   Pedal edema 12/18/2016   Transaminitis 12/18/2016   Bilateral pes planus 12/18/2016   Psoriasis-eczema overlap  condition 07/30/2016   High serum high density lipoprotein (HDL) 09/26/2015   Epistaxis 09/26/2015   Renal artery stenosis in 1 of 2 vessels (HCC)    Essential tremor    Elevated PSA    GERD (gastroesophageal reflux disease)    Health maintenance examination 10/08/2014   Anxiety state 10/08/2014   Obesity, Class I, BMI 30-34.9    Mild intermittent asthma in adult without complication    Seasonal allergies    HTN (hypertension)    Scoliosis    Osteoporosis     Shavaughn Seidl W. 08/10/2019, 6:02 PM  Frazier Butt., PT   [facility] [address] [place], [place], [address] Phone: [phone]   Fax:  [phone]  Name: Louis [name] MRN: [id number] Date of Birth: [birth year]

## 2019-08-11 ENCOUNTER — Telehealth: Payer: Self-pay | Admitting: Acute Care

## 2019-08-11 ENCOUNTER — Ambulatory Visit: Payer: 59 | Admitting: Physical Therapy

## 2019-08-11 NOTE — Telephone Encounter (Signed)
Rodena Piety, please advise if you have CMN on pt.  DME order was placed 8/6 for pt to have DME changed from [facility] to [facility].

## 2019-08-11 NOTE — Telephone Encounter (Signed)
I called Louis [name] with [facility] just to verify what she was really needing. She stated that they would need an old CMN from [facility] which I even looked and there wasn't a CMN to be found.  Louis [name] stated that if we couldn't get an old CMN that the patient would need to have another CPAP titration study done. I have now spoke with Melissa from [facility] and I told her it was strange for [place] not to have at least 1 CMN since the Cpap and 02 was started at the same time.  She is going to check to see who had been signing the CMNs since 2019 and get back with me

## 2019-08-12 ENCOUNTER — Other Ambulatory Visit: Payer: Self-pay

## 2019-08-12 ENCOUNTER — Encounter: Payer: Self-pay | Admitting: Physical Therapy

## 2019-08-12 ENCOUNTER — Ambulatory Visit: Payer: 59 | Admitting: Physical Therapy

## 2019-08-12 DIAGNOSIS — M6281 Muscle weakness (generalized): Secondary | ICD-10-CM

## 2019-08-12 DIAGNOSIS — R2689 Other abnormalities of gait and mobility: Secondary | ICD-10-CM | POA: Diagnosis not present

## 2019-08-12 LAB — B. BURGDORFI ANTIBODIES: Lyme IgG/IgM Ab: <0.91 {ISR} (ref 0.00–0.90)

## 2019-08-12 LAB — COPPER, SERUM: Copper: 154 ug/dL — ABNORMAL HIGH (ref 69–132)

## 2019-08-12 LAB — ANA W/REFLEX: Anti Nuclear Antibody (ANA): NEGATIVE

## 2019-08-12 LAB — MULTIPLE MYELOMA PANEL, SERUM
Albumin SerPl Elph-Mcnc: 3.3 g/dL (ref 2.9–4.4)
Albumin/Glob SerPl: 1 (ref 0.7–1.7)
Alpha 1: 0.3 g/dL (ref 0.0–0.4)
Alpha2 Glob SerPl Elph-Mcnc: 1 g/dL (ref 0.4–1.0)
B-Globulin SerPl Elph-Mcnc: 0.9 g/dL (ref 0.7–1.3)
Gamma Glob SerPl Elph-Mcnc: 1.5 g/dL (ref 0.4–1.8)
Globulin, Total: 3.6 g/dL (ref 2.2–3.9)
IgA/Immunoglobulin A, Serum: 206 mg/dL (ref 61–437)
IgG (Immunoglobin G), Serum: 1349 mg/dL (ref 603–1613)
IgM (Immunoglobulin M), Srm: 53 mg/dL (ref 20–172)
Total Protein: 6.9 g/dL (ref 6.0–8.5)

## 2019-08-13 ENCOUNTER — Other Ambulatory Visit: Payer: Self-pay | Admitting: Physical Medicine and Rehabilitation

## 2019-08-13 NOTE — Therapy (Signed)
[facility] [address] [place], [place], [address] Phone: [phone]   Fax:  [phone]  Physical Therapy Treatment  Patient Details  Name: Louis [name] MRN: [id number] Date of Birth: 1955/04/12 Referring Provider (PT): Marlowe Shores, PA-C Grant Fontana, MD)   Encounter Date: 08/12/2019     08/12/19 0852  PT Visits / Re-Eval  Visit Number 6  Number of Visits [place], covered 100% as ded and OOPM met; VL:  30  PT Time Calculation  PT Start Time 0849  PT Stop Time 0930  PT Time Calculation (min) 41 min  PT - End of Session  Equipment Utilized During Treatment Gait belt  Activity Tolerance Patient tolerated treatment well  Behavior During Therapy [facility] for tasks assessed/performed    Past Medical History:  Diagnosis Date  . Anxiety    situational  . BPH (benign prostatic hyperplasia)    on flomax  . Bundle branch block    per prior PCP records  . CHF (congestive heart failure) ([place])   . COPD (chronic obstructive pulmonary disease) ([place])   . Diverticulosis    by colonoscopy  . Eczema    per prior PCP records  . Elevated PSA 2015   peaked 6s, s/p benign biopsy 2014, sees urology [name] ([place])  . Essential tremor   . GERD (gastroesophageal reflux disease)    per prior PCP records  . History of panic attacks    per prior PCP records  . HTN (hypertension)   . Mild intermittent asthma in adult without complication   . Obesity, Class I, BMI 30-34.9   . Osteoporosis    DEXA 06/2013 with osteopenia - h/o ?wrist/hip fracture from AVN from chronic steroid use (asthma), took reclast for 1 year  . Renal artery stenosis in 1 of 2 vessels ([place]) 2011   by [place]  . Seasonal allergies   . Sleep apnea   . Thoracic scoliosis childhood  . Transaminitis    per prior PCP records    Past Surgical History:  Procedure Laterality Date  . APPLICATION OF WOUND VAC Left  05/09/2018   Procedure: Application Of Wound Vac;  Surgeon: Newt Minion, MD;  Location: [facility];  Service: Orthopedics;  Laterality: Left;  . BIOPSY  04/30/2019   Procedure: BIOPSY;  Surgeon: Juanita Craver, MD;  [facility];  Service: Endoscopy;;  . COLONOSCOPY  12/2013   polyps, int hem, diverticulosis, rpt 5 yrs ([place])  . COLONOSCOPY WITH PROPOFOL N/A 04/30/2019   Procedure: COLONOSCOPY WITH PROPOFOL;  Surgeon: Juanita Craver, MD;  [facility];  Service: Endoscopy;  Laterality: N/A;  . ELBOW SURGERY Right 2008   golfer's elbow  . ESOPHAGOGASTRODUODENOSCOPY (EGD) WITH PROPOFOL N/A 04/30/2019   Procedure: ESOPHAGOGASTRODUODENOSCOPY (EGD) WITH PROPOFOL;  Surgeon: Juanita Craver, MD;  [facility];  Service: Endoscopy;  Laterality: N/A;  . INGUINAL HERNIA REPAIR Bilateral childhood & ~1995  . LAPAROTOMY N/A 01/05/2019   Procedure: EXPLORATORY LAPAROTOMY graham patch repair;  Surgeon: Benjamine Sprague, DO;  Location: [facility];  Service: General;  Laterality: N/A;  . NASAL SEPTUM SURGERY  2013   deviated septum  . ORIF ANKLE FRACTURE Left 05/09/2018   Procedure: OPEN REDUCTION INTERNAL FIXATION LEFT LISFRANC FRACTURE/DISLOCATION;  Surgeon: Newt Minion, MD;  Location: [facility];  Service: Orthopedics;  Laterality: Left;  . POLYPECTOMY  04/30/2019   Procedure: POLYPECTOMY;  Surgeon: Juanita Craver, MD;  [facility];  Service: Endoscopy;;  .  TEE WITHOUT CARDIOVERSION N/A 06/29/2019   Procedure: TRANSESOPHAGEAL ECHOCARDIOGRAM (TEE);  Surgeon: Acie Fredrickson Wonda Cheng, MD;  [facility];  Service: Cardiovascular;  Laterality: N/A;  . US ECHOCARDIOGRAPHY  02/2009   WNL, EF >55% Claiborne Billings)  . US RENAL/AORTA Right 05/2009   1-59% diameter reduction renal artery, rec rpt 2 yrs    There were no vitals filed for this visit.      08/12/19 0851  Symptoms/Limitations  Subjective No new complaints. No falls or pain to report. Leaving for mountains today for a week. Has been  practicing with walking sticks at home.  Pertinent History L foot fracture, sepsis with septic shock, acute renal railure, OSA, GERD, anxiety, HTN, restrictive lung disease with scoliosis, neuropathy  Patient Stated Goals Pt's goal for therapy are for strengthening and endurance.  Pain Assessment  Currently in Pain? No/denies        08/12/19 0854  Transfers  Transfers Sit to Stand;Stand to Sit  Sit to Stand 5: Supervision;With upper extremity assist;From bed;From chair/3-in-1;Without upper extremity assist  Stand to Sit 5: Supervision;With upper extremity assist;To bed;To chair/3-in-1;Without upper extremity assist  Ambulation/Gait  Ambulation/Gait Yes  Ambulation/Gait Assistance 5: Supervision;4: Min guard  Ambulation/Gait Assistance Details use of bil walking poles with outdoors, including up/down steep grassy hills x 2 hills  Ambulation Distance (Feet) 400 Feet (x1 in/outdoors with sticks, around gym no device)  Assistive device None;Other (Comment) (walking poles outdoors)  Gait Pattern Step-through pattern;Wide base of support  Ambulation Surface Level;Unlevel;Indoor;Outdoor;Paved;Grass  Neuro Re-ed   Neuro Re-ed Details  for balance/muscle re-ed: blue mat over ramp- facing up ramp alternating fwd stepping then back for 10 reps each side, then with feet together for EC no head movements for 30 sec's x 3 reps. up to min assist needed with tremor/sway noted with EC;  then facing down the ramp- alternating fwd stepping/back for 10 reps each side, then with feet together for EC no head movements 30 sec's for 3 reps, mild sway noted with up to min assist needed.        08/12/19 0915  Balance Exercises: Standing  Rockerboard Anterior/posterior;Lateral;30 seconds;EO;EC;Limitations  Rockerboard Limitations performed both ways on  balance board with no UE support: rocking the board with EO progressing to EC with up to min assist needed for balance; then holding the board steady with EC  for 30 sec's x 3 reps. cues needed for posture and weight shifting with up to min assist for balance. up to min assist needed for balance.           PT Long Term Goals - 07/27/19 0909      PT LONG TERM GOAL #1   Title Pt will be independent with HEP for improved strength, balance, transfers, and gait.  TARGET 08/28/2019 (may be extended 1 additional week, due to pt out of town)    Time 5    Period Weeks    Status New      PT LONG TERM GOAL #2   Title Pt will improve 5x sit<>stand to less than or equal to 20 sec to demonstrate improved functional strength and transfer efficiency.    Baseline 29.87 sec    Time 5    Period Weeks    Status New      PT LONG TERM GOAL #3   Title Pt will improve DGI score to at least 20/24 to decrease fall risk.    Baseline 17/24    Time 5    Period Weeks  Status New      PT LONG TERM GOAL #4   Title Pt will improve distance ambulated in 3 MWT to at least 610 ft for improved gait efficiency with community gait.    Baseline 563 ft in 3 minutes    Time 5    Period Weeks    Status New      PT LONG TERM GOAL #5   Title Pt stand at least 30 seconds EC on foam, demonstrating improved vestibular input/use for balance.    Baseline 10 sec foam, EC with min guard and incr. sway    Time 5    Period Weeks    Status New      Additional Long Term Goals   Additional Long Term Goals Yes      PT LONG TERM GOAL #6   Title Pt will verbalize understanding of fall prevention in home environment.    Time 5    Period Weeks    Status New              08/12/19 8003  Plan  Clinical Impression Statement Today's skilled session initially focused on gait with walking poles on outdoor steep inclines with up to min guard assist in prep for pt's upcoming trip to the mountains. Remainder of session continued to focus on balance reactions on inclines/declines and unstable surfaces with/without vision removed. Up to mod assist needed for balance in session today.  The pt is progressing toward goals and should benefit from continued PT to progress toward unmet goals.  Personal Factors and Comorbidities Comorbidity 3+  Comorbidities history of familial tremors, restrictive lung disease with scoliosis, perforated duodenal ulcer, hepatic steatosis, moderate to excessive alcohol use, left Lisfranc fracture with dislocations s/p ORIF with onset of neuropathy, B12 deficiency, gait disorder with history of falls; R 5th metatarsal fx s/p fall 06/2019  Examination-Activity Limitations Locomotion Level;Transfers;Stand;Stairs  Examination-Participation Restrictions Church;Community Activity;Other (Fly fishing, golf, pilates)  Pt will benefit from skilled therapeutic intervention in order to improve on the following deficits Abnormal gait;Difficulty walking;Decreased endurance;Decreased activity tolerance;Decreased balance;Decreased mobility;Decreased strength;Postural dysfunction  Stability/Clinical Decision Making Evolving/Moderate complexity  Rehab Potential Good  PT Frequency Other (comment) (1x/wk for 1 week, then 2x/wk for 4 weeks)  PT Duration Other (comment) (5 weeks total POC)  PT Treatment/Interventions ADLs/Self Care Home Management;Gait training;Stair training;Functional mobility training;Therapeutic activities;Therapeutic exercise;Balance training;Neuromuscular re-education;Patient/family education  PT Next Visit Plan Balance on incline/DECLINE surfaces.  If not too hot outside, work on walking poles on hills.  Balance work on compliant surfaces-EO and EC for improved balance.  Consulted and Agree with Plan of Care Patient         Patient will benefit from skilled therapeutic intervention in order to improve the following deficits and impairments:  Abnormal gait, Difficulty walking, Decreased endurance, Decreased activity tolerance, Decreased balance, Decreased mobility, Decreased strength, Postural dysfunction  Visit Diagnosis: Other abnormalities of  gait and mobility  Muscle weakness (generalized)     Problem List Patient Active Problem List   Diagnosis Date Noted  . Neuropathy 07/20/2019  . Hypomagnesemia   . Macrocytic anemia   . Bacteremia   . Debility 07/07/2019  . Acute respiratory failure with hypoxia ([place])   . Atelectasis   . Enterococcal bacteremia 06/26/2019  . AKI (acute kidney injury) ([place]) 06/25/2019  . Perforated viscus 01/06/2019  . Severe sepsis with septic shock ([place]) 01/06/2019  . Acute renal failure ([place]) 01/06/2019  . Hyperkalemia 01/06/2019  . Hyponatremia 01/06/2019  . Lactic acidosis  01/06/2019  . Onychomycosis 05/19/2018  . Foot fracture, left, closed, initial encounter 05/09/2018  . Lisfranc dislocation, left, initial encounter   . Acute bronchitis 02/25/2018  . Primary pulmonary hypertension ([place]) 07/09/2017  . Diastolic dysfunction 98/33/8250  . OSA (obstructive sleep apnea) 07/08/2017  . SOB (shortness of breath) 07/02/2017  . Chronic respiratory failure with hypoxia and hypercapnia ([place]) 07/02/2017  . Cardiomegaly 07/02/2017  . Exposure keratitis 02/17/2017  . Pedal edema 12/18/2016  . Transaminitis 12/18/2016  . Bilateral pes planus 12/18/2016  . Psoriasis-eczema overlap condition 07/30/2016  . High serum high density lipoprotein (HDL) 09/26/2015  . Epistaxis 09/26/2015  . Renal artery stenosis in 1 of 2 vessels ([place])   . Essential tremor   . Elevated PSA   . GERD (gastroesophageal reflux disease)   . Health maintenance examination 10/08/2014  . Anxiety state 10/08/2014  . Obesity, Class I, BMI 30-34.9   . Mild intermittent asthma in adult without complication   . Seasonal allergies   . HTN (hypertension)   . Scoliosis   . Osteoporosis     Willow Ora, PTA, [facility] [address] [place], [place] [address] [phone] 08/13/19, 9:35 PM   Name: Cj Edgell MRN: [id number] Date of Birth: 1955-03-30

## 2019-08-19 ENCOUNTER — Encounter: Payer: Self-pay | Admitting: Family Medicine

## 2019-08-20 ENCOUNTER — Other Ambulatory Visit: Payer: Self-pay

## 2019-08-20 ENCOUNTER — Ambulatory Visit: Payer: 59 | Admitting: Physical Therapy

## 2019-08-20 DIAGNOSIS — M6281 Muscle weakness (generalized): Secondary | ICD-10-CM

## 2019-08-20 DIAGNOSIS — R2689 Other abnormalities of gait and mobility: Secondary | ICD-10-CM

## 2019-08-20 NOTE — Telephone Encounter (Signed)
Patient is requesting a refill of the following medications: Requested Prescriptions   Pending Prescriptions Disp Refills   folic acid (FOLVITE) 1 MG tablet      Sig: Take 1 tablet (1 mg total) by mouth daily.   magnesium oxide (MAG-OX) 400 (241.3 Mg) MG tablet 90 tablet 0    Sig: Take 1 tablet (400 mg total) by mouth with breakfast, with lunch, and with evening meal.    Date of patient request: 08/20/2019 Last office visit: 07/21/2019 Date of last refill: 07/08/2019, 07/17/2019 Last refill amount: unknown on Folic acid, 90 tab Mag-Ox     Pt received these while in hospital and would like you to continue these for him

## 2019-08-20 NOTE — Therapy (Signed)
[facility] [address] [place], [place], [address] Phone: [phone]   Fax:  [phone]  Physical Therapy Treatment  Patient Details  Name: Louis [name] MRN: [id number] Date of Birth: 09-01-1955 Referring Provider (PT): Marlowe Shores, PA-C Grant Fontana, MD)   Encounter Date: 08/20/2019   PT End of Session - 08/20/19 1351    Visit Number 7    Number of Visits 10    Authorization Type [employer], covered 100% as ded and OOPM met; VL:  30    PT Start Time 0932    PT Stop Time 1014    PT Time Calculation (min) 42 min    Equipment Utilized During Treatment Gait belt    Activity Tolerance Patient tolerated treatment well    Behavior During Therapy WFL for tasks assessed/performed           Past Medical History:  Diagnosis Date  . Anxiety    situational  . BPH (benign prostatic hyperplasia)    on flomax  . Bundle branch block    per prior PCP records  . CHF (congestive heart failure) ([place])   . COPD (chronic obstructive pulmonary disease) ([place])   . Diverticulosis    by colonoscopy  . Eczema    per prior PCP records  . Elevated PSA 2015   peaked 6s, s/p benign biopsy 2014, sees urology [name] ([place])  . Essential tremor   . GERD (gastroesophageal reflux disease)    per prior PCP records  . History of panic attacks    per prior PCP records  . HTN (hypertension)   . Mild intermittent asthma in adult without complication   . Obesity, Class I, BMI 30-34.9   . Osteoporosis    DEXA 06/2013 with osteopenia - h/o ?wrist/hip fracture from AVN from chronic steroid use (asthma), took reclast for 1 year  . Renal artery stenosis in 1 of 2 vessels ([place]) 2011   by [place]  . Seasonal allergies   . Sleep apnea   . Thoracic scoliosis childhood  . Transaminitis    per prior PCP records    Past Surgical History:  Procedure Laterality Date  . APPLICATION OF WOUND VAC Left 05/09/2018   Procedure: Application Of Wound  Vac;  Surgeon: Newt Minion, MD;  Location: [facility];  Service: Orthopedics;  Laterality: Left;  . BIOPSY  04/30/2019   Procedure: BIOPSY;  Surgeon: Juanita Craver, MD;  [facility];  Service: Endoscopy;;  . COLONOSCOPY  12/2013   polyps, int hem, diverticulosis, rpt 5 yrs ([place])  . COLONOSCOPY WITH PROPOFOL N/A 04/30/2019   Procedure: COLONOSCOPY WITH PROPOFOL;  Surgeon: Juanita Craver, MD;  [facility];  Service: Endoscopy;  Laterality: N/A;  . ELBOW SURGERY Right 2008   golfer's elbow  . ESOPHAGOGASTRODUODENOSCOPY (EGD) WITH PROPOFOL N/A 04/30/2019   Procedure: ESOPHAGOGASTRODUODENOSCOPY (EGD) WITH PROPOFOL;  Surgeon: Juanita Craver, MD;  [facility];  Service: Endoscopy;  Laterality: N/A;  . INGUINAL HERNIA REPAIR Bilateral childhood & ~1995  . LAPAROTOMY N/A 01/05/2019   Procedure: EXPLORATORY LAPAROTOMY graham patch repair;  Surgeon: Benjamine Sprague, DO;  Location: [facility];  Service: General;  Laterality: N/A;  . NASAL SEPTUM SURGERY  2013   deviated septum  . ORIF ANKLE FRACTURE Left 05/09/2018   Procedure: OPEN REDUCTION INTERNAL FIXATION LEFT LISFRANC FRACTURE/DISLOCATION;  Surgeon: Newt Minion, MD;  Location: [facility];  Service: Orthopedics;  Laterality: Left;  . POLYPECTOMY  04/30/2019   Procedure: POLYPECTOMY;  Surgeon: [name],  Mar Daring, MD;  [facility];  Service: Endoscopy;;  . TEE WITHOUT CARDIOVERSION N/A 06/29/2019   Procedure: TRANSESOPHAGEAL ECHOCARDIOGRAM (TEE);  Surgeon: Acie Fredrickson Wonda Cheng, MD;  [facility];  Service: Cardiovascular;  Laterality: N/A;  . US ECHOCARDIOGRAPHY  02/2009   WNL, EF >55% Claiborne Billings)  . US RENAL/AORTA Right 05/2009   1-59% diameter reduction renal artery, rec rpt 2 yrs    There were no vitals filed for this visit.   Subjective Assessment - 08/20/19 0935    Subjective Just got back from the trip in the mountains; did some walking and realized my leg strength and endurance is still coming along.  No falls.  Small  hernia has ballooned larger (from incision in January); to see MD tomorrow    Pertinent History L foot fracture, sepsis with septic shock, acute renal railure, OSA, GERD, anxiety, HTN, restrictive lung disease with scoliosis, neuropathy    Patient Stated Goals Pt's goal for therapy are for strengthening and endurance.    Pain Score 0-No pain                             [facility] Adult PT Treatment/Exercise - 08/20/19 0001      Transfers   Transfers Sit to Stand;Stand to Sit    Sit to Stand 5: Supervision;With upper extremity assist;From bed    Stand to Sit 5: Supervision;To bed;Without upper extremity assist    Number of Reps 2 sets;Other reps (comment)   5 reps    Comments Additional 3rd set from mat surface, standing on foam      Ambulation/Gait   Ambulation/Gait --      Knee/Hip Exercises: Stretches   Active Hamstring Stretch Right;Left;3 reps;30 seconds    Active Hamstring Stretch Limitations foot propped on floor      Knee/Hip Exercises: Standing   Heel Raises Both;2 sets;10 reps;3 seconds    Heel Raises Limitations Cues for hold time    Knee Flexion Strengthening;Right;Left;2 sets;10 reps    Hip Abduction Stengthening;Right;Left;2 sets;10 reps    Hip Extension Stengthening;Both;2 sets;10 reps    Lateral Step Up Right;Left;1 set;5 reps;Hand Hold: 2;Step Height: 6"    Forward Step Up Right;Left;2 sets;10 reps;Hand Hold: 2;Step Height: 6"    Forward Step Up Limitations step up-up, down-down x 10 reps, then single limb step up x 10 reps , 3 sec hold with 1 UE support    Step Down Right;Left;10 reps;Hand Hold: 2;Step Height: 6";1 set    Other Standing Knee Exercises Sidestep R and L, 10 reps each direction, 2 sets     Other Standing Knee Exercises Alt step taps 6", 12", then 6/12/6" steps, 10 reps each, BUE support           No c/o with increased pain (in hernia area of lower abdomen) throughout exercises             PT Long Term Goals - 07/27/19 0909       PT LONG TERM GOAL #1   Title Pt will be independent with HEP for improved strength, balance, transfers, and gait.  TARGET 08/28/2019 (may be extended 1 additional week, due to pt out of town)    Time 5    Period Weeks    Status New      PT LONG TERM GOAL #2   Title Pt will improve 5x sit<>stand to less than or equal to 20 sec to demonstrate improved functional strength and  transfer efficiency.    Baseline 29.87 sec    Time 5    Period Weeks    Status New      PT LONG TERM GOAL #3   Title Pt will improve DGI score to at least 20/24 to decrease fall risk.    Baseline 17/24    Time 5    Period Weeks    Status New      PT LONG TERM GOAL #4   Title Pt will improve distance ambulated in 3 MWT to at least 610 ft for improved gait efficiency with community gait.    Baseline 563 ft in 3 minutes    Time 5    Period Weeks    Status New      PT LONG TERM GOAL #5   Title Pt stand at least 30 seconds EC on foam, demonstrating improved vestibular input/use for balance.    Baseline 10 sec foam, EC with min guard and incr. sway    Time 5    Period Weeks    Status New      Additional Long Term Goals   Additional Long Term Goals Yes      PT LONG TERM GOAL #6   Title Pt will verbalize understanding of fall prevention in home environment.    Time 5    Period Weeks    Status New                 Plan - 08/20/19 1352    Clinical Impression Statement Treatment session focused today on lower extrmeity strengthening, which patient does not currently have many exercises in HEP (most of HEP is for balance).  He performs most standing stregnthening exercises today 2 sets of 10 reps, and needs 1 seated rest break throughout.  He will continue to benefit from skilled PT to further address strengthening and balance for improved overall gait and mobility.    Personal Factors and Comorbidities Comorbidity 3+    Comorbidities history of familial tremors, restrictive lung disease with  scoliosis, perforated duodenal ulcer, hepatic steatosis, moderate to excessive alcohol use, left Lisfranc fracture with dislocations s/p ORIF with onset of neuropathy, B12 deficiency, gait disorder with history of falls; R 5th metatarsal fx s/p fall 06/2019    Examination-Activity Limitations Locomotion Level;Transfers;Stand;Stairs    Examination-Participation Restrictions Church;Community Activity;Other   Fly fishing, golf, pilates   Stability/Clinical Decision Making Evolving/Moderate complexity    Rehab Potential Good    PT Frequency Other (comment)   1x/wk for 1 week, then 2x/wk for 4 weeks   PT Duration Other (comment)   5 weeks total POC   PT Treatment/Interventions ADLs/Self Care Home Management;Gait training;Stair training;Functional mobility training;Therapeutic activities;Therapeutic exercise;Balance training;Neuromuscular re-education;Patient/family education    PT Next Visit Plan Add to HEP:  step ups, step taps; work on balance on compliant surfaces-EO and EC for improved balance    Consulted and Agree with Plan of Care Patient           Patient will benefit from skilled therapeutic intervention in order to improve the following deficits and impairments:  Abnormal gait, Difficulty walking, Decreased endurance, Decreased activity tolerance, Decreased balance, Decreased mobility, Decreased strength, Postural dysfunction  Visit Diagnosis: Muscle weakness (generalized)  Other abnormalities of gait and mobility     Problem List Patient Active Problem List   Diagnosis Date Noted  . Neuropathy 07/20/2019  . Hypomagnesemia   . Macrocytic anemia   . Bacteremia   . Debility 07/07/2019  . Acute  respiratory failure with hypoxia ([place])   . Atelectasis   . Enterococcal bacteremia 06/26/2019  . AKI (acute kidney injury) ([place]) 06/25/2019  . Perforated viscus 01/06/2019  . Severe sepsis with septic shock ([place]) 01/06/2019  . Acute renal failure ([place]) 01/06/2019  . Hyperkalemia  01/06/2019  . Hyponatremia 01/06/2019  . Lactic acidosis 01/06/2019  . Onychomycosis 05/19/2018  . Foot fracture, left, closed, initial encounter 05/09/2018  . Lisfranc dislocation, left, initial encounter   . Acute bronchitis 02/25/2018  . Primary pulmonary hypertension ([place]) 07/09/2017  . Diastolic dysfunction 32/35/5732  . OSA (obstructive sleep apnea) 07/08/2017  . SOB (shortness of breath) 07/02/2017  . Chronic respiratory failure with hypoxia and hypercapnia ([place]) 07/02/2017  . Cardiomegaly 07/02/2017  . Exposure keratitis 02/17/2017  . Pedal edema 12/18/2016  . Transaminitis 12/18/2016  . Bilateral pes planus 12/18/2016  . Psoriasis-eczema overlap condition 07/30/2016  . High serum high density lipoprotein (HDL) 09/26/2015  . Epistaxis 09/26/2015  . Renal artery stenosis in 1 of 2 vessels ([place])   . Essential tremor   . Elevated PSA   . GERD (gastroesophageal reflux disease)   . Health maintenance examination 10/08/2014  . Anxiety state 10/08/2014  . Obesity, Class I, BMI 30-34.9   . Mild intermittent asthma in adult without complication   . Seasonal allergies   . HTN (hypertension)   . Scoliosis   . Osteoporosis     Kabe Mckoy W. 08/20/2019, 1:54 PM  Frazier Butt., PT   [facility] [address] [place], [place], [address] Phone: [phone]   Fax:  [phone]  Name: Matty Vanroekel MRN: [id number] Date of Birth: 05-23-1955

## 2019-08-21 ENCOUNTER — Ambulatory Visit: Payer: 59 | Admitting: Physical Therapy

## 2019-08-21 DIAGNOSIS — R2681 Unsteadiness on feet: Secondary | ICD-10-CM

## 2019-08-21 DIAGNOSIS — R2689 Other abnormalities of gait and mobility: Secondary | ICD-10-CM | POA: Diagnosis not present

## 2019-08-21 DIAGNOSIS — M6281 Muscle weakness (generalized): Secondary | ICD-10-CM

## 2019-08-21 MED ORDER — FOLIC ACID 1 MG PO TABS: 1.0000 mg | ORAL_TABLET | Freq: Every day | ORAL | 0 refills | Status: DC

## 2019-08-21 MED ORDER — MAGNESIUM OXIDE 400 (241.3 MG) MG PO TABS
400.0000 mg | ORAL_TABLET | Freq: Three times a day (TID) | ORAL | 0 refills | Status: DC
Start: 2019-08-21 — End: 2019-12-18

## 2019-08-21 NOTE — Therapy (Addendum)
[facility] [address] [place], [place], [address] Phone: [phone]   Fax:  [phone]  Physical Therapy Treatment  Patient Details  Name: Louis [name] MRN: [id number] Date of Birth: 09-24-1955 Referring Provider (PT): Marlowe Shores, PA-C Grant Fontana, MD)   Encounter Date: 08/21/2019   PT End of Session - 08/21/19 1752    Visit Number 8    Number of Visits 10    Authorization Type [employer], covered 100% as ded and OOPM met; VL:  30    PT Start Time 0935    PT Stop Time 1013    PT Time Calculation (min) 38 min    Equipment Utilized During Treatment Gait belt    Activity Tolerance Patient tolerated treatment well    Behavior During Therapy WFL for tasks assessed/performed           Past Medical History:  Diagnosis Date  . Anxiety    situational  . BPH (benign prostatic hyperplasia)    on flomax  . Bundle branch block    per prior PCP records  . CHF (congestive heart failure) ([place])   . COPD (chronic obstructive pulmonary disease) ([place])   . Diverticulosis    by colonoscopy  . Eczema    per prior PCP records  . Elevated PSA 2015   peaked 6s, s/p benign biopsy 2014, sees urology [name] ([place])  . Essential tremor   . GERD (gastroesophageal reflux disease)    per prior PCP records  . History of panic attacks    per prior PCP records  . HTN (hypertension)   . Mild intermittent asthma in adult without complication   . Obesity, Class I, BMI 30-34.9   . Osteoporosis    DEXA 06/2013 with osteopenia - h/o ?wrist/hip fracture from AVN from chronic steroid use (asthma), took reclast for 1 year  . Renal artery stenosis in 1 of 2 vessels ([place]) 2011   by [place]  . Seasonal allergies   . Sleep apnea   . Thoracic scoliosis childhood  . Transaminitis    per prior PCP records    Past Surgical History:  Procedure Laterality Date  . APPLICATION OF WOUND VAC Left 05/09/2018   Procedure: Application Of Wound  Vac;  Surgeon: Newt Minion, MD;  Location: [facility];  Service: Orthopedics;  Laterality: Left;  . BIOPSY  04/30/2019   Procedure: BIOPSY;  Surgeon: Juanita Craver, MD;  Location: [facility];  Service: Endoscopy;;  . COLONOSCOPY  12/2013   polyps, int hem, diverticulosis, rpt 5 yrs ([place])  . COLONOSCOPY WITH PROPOFOL N/A 04/30/2019   Procedure: COLONOSCOPY WITH PROPOFOL;  Surgeon: Juanita Craver, MD;  Location: [facility];  Service: Endoscopy;  Laterality: N/A;  . ELBOW SURGERY Right 2008   golfer's elbow  . ESOPHAGOGASTRODUODENOSCOPY (EGD) WITH PROPOFOL N/A 04/30/2019   Procedure: ESOPHAGOGASTRODUODENOSCOPY (EGD) WITH PROPOFOL;  Surgeon: Juanita Craver, MD;  Location: [facility];  Service: Endoscopy;  Laterality: N/A;  . INGUINAL HERNIA REPAIR Bilateral childhood & ~1995  . LAPAROTOMY N/A 01/05/2019   Procedure: EXPLORATORY LAPAROTOMY graham patch repair;  Surgeon: Benjamine Sprague, DO;  Location: [facility];  Service: General;  Laterality: N/A;  . NASAL SEPTUM SURGERY  2013   deviated septum  . ORIF ANKLE FRACTURE Left 05/09/2018   Procedure: OPEN REDUCTION INTERNAL FIXATION LEFT LISFRANC FRACTURE/DISLOCATION;  Surgeon: Newt Minion, MD;  Location: [facility];  Service: Orthopedics;  Laterality: Left;  . POLYPECTOMY  04/30/2019   Procedure: POLYPECTOMY;  Surgeon: [name],  Mar Daring, MD;  Location: [facility];  Service: Endoscopy;;  . TEE WITHOUT CARDIOVERSION N/A 06/29/2019   Procedure: TRANSESOPHAGEAL ECHOCARDIOGRAM (TEE);  Surgeon: Acie Fredrickson Wonda Cheng, MD;  Location: [facility];  Service: Cardiovascular;  Laterality: N/A;  . US ECHOCARDIOGRAPHY  02/2009   WNL, EF >55% [facility])  . US RENAL/AORTA Right 05/2009   1-59% diameter reduction renal artery, rec rpt 2 yrs    There were no vitals filed for this visit.   Subjective Assessment - 08/21/19 0936    Subjective No changes, no pain.    Pertinent History L foot fracture, sepsis with septic shock, acute renal railure, OSA, GERD, anxiety, HTN, restrictive lung  disease with scoliosis, neuropathy    Patient Stated Goals Pt's goal for therapy are for strengthening and endurance.    Currently in Pain? No/denies                             [facility] Adult PT Treatment/Exercise - 08/21/19 0001      Ambulation/Gait   Ambulation/Gait Yes    Ambulation/Gait Assistance 5: Supervision;4: Min guard    Ambulation/Gait Assistance Details Indoor gait in 3 minute walk test:  683 ft (improved from 563 ft initially), no device    Ambulation Distance (Feet) 700 Feet   (total); additional outdoor gait on grass   Assistive device None;Other (Comment)   single walking pole   Gait Pattern Step-through pattern;Wide base of support    Ambulation Surface Level;Indoor;Unlevel;Outdoor;Grass    Gait Comments Gait using single walking pole on outdoor surfaces including gravel and grass, (walked in grassy area behind building x 4 reps) with supervision, no LOB.  Cues for slowed pace and foot clearance.      Neuro Re-ed    Neuro Re-ed Details  On incline/decline blue mat surface:  feet apart with head turns x 5, head nods x 5 EO and EC, with min guard assistance; on decline with stride stance:  head turns and head nods x 5 with EO, using walking pole as support.  PT provides min guard.  On blue mat surface beside counter:  step over hurdles x 10 reps each side, then alt step taps to cones x 10 reps with 1 UE support for balance.      Knee/Hip Exercises: Standing   Forward Step Up Right;Left;2 sets;10 reps;Hand Hold: 2;Step Height: 6"    Forward Step Up Limitations step up-up, down-down x 10 reps, then single limb step up x 10 reps , 3 sec hold with 1 UE support    Other Standing Knee Exercises Alt step taps 6", 12", then 6/12/6" steps, 10 reps each, BUE support                  PT Education - 08/21/19 1751    Education Details Additions to [employer]) Educated Patient    Methods Explanation;Demonstration;Handout    Comprehension Verbalized  understanding;Returned demonstration               PT Long Term Goals - 08/21/19 1752      PT LONG TERM GOAL #1   Title Pt will be independent with HEP for improved strength, balance, transfers, and gait.  TARGET 08/28/2019 (may be extended 1 additional week, due to pt out of town)    Time 5    Period Weeks    Status New      PT LONG TERM GOAL #2   Title  Pt will improve 5x sit<>stand to less than or equal to 20 sec to demonstrate improved functional strength and transfer efficiency.    Baseline 29.87 sec    Time 5    Period Weeks    Status New      PT LONG TERM GOAL #3   Title Pt will improve DGI score to at least 20/24 to decrease fall risk.    Baseline 17/24    Time 5    Period Weeks    Status New      PT LONG TERM GOAL #4   Title Pt will improve distance ambulated in 3 MWT to at least 610 ft for improved gait efficiency with community gait.    Baseline 563 ft in 3 minutes; 683 ft in 3 minutes 08/21/2019    Time 5    Period Weeks    Status Achieved      PT LONG TERM GOAL #5   Title Pt stand at least 30 seconds EC on foam, demonstrating improved vestibular input/use for balance.    Baseline 10 sec foam, EC with min guard and incr. sway    Time 5    Period Weeks    Status New      PT LONG TERM GOAL #6   Title Pt will verbalize understanding of fall prevention in home environment.    Time 5    Period Weeks    Status New             Plan - 08/21/19 1818    Clinical Impression Statement Began looking at goals today, with pt meeting goal for improved distance in 3 MWT (by 120 ft).  Added stregnthening to HEP and continued compliant surface balance and gait acitvities.  He is safely able to negotiate unlevel outdoor ground with single walking pole.    Personal Factors and Comorbidities Comorbidity 3+    Comorbidities history of familial tremors, restrictive lung disease with scoliosis, perforated duodenal ulcer, hepatic steatosis, moderate to excessive alcohol use,  left Lisfranc fracture with dislocations s/p ORIF with onset of neuropathy, B12 deficiency, gait disorder with history of falls; R 5th metatarsal fx s/p fall 06/2019    Examination-Activity Limitations Locomotion Level;Transfers;Stand;Stairs    Examination-Participation Restrictions Church;Community Activity;Other   Fly fishing, golf, pilates   Stability/Clinical Decision Making Evolving/Moderate complexity    Rehab Potential Good    PT Frequency Other (comment)   1x/wk for 1 week, then 2x/wk for 4 weeks   PT Duration Other (comment)   5 weeks total POC   PT Treatment/Interventions ADLs/Self Care Home Management;Gait training;Stair training;Functional mobility training;Therapeutic activities;Therapeutic exercise;Balance training;Neuromuscular re-education;Patient/family education    PT Next Visit Plan Check LTGs and discuss POC; work on balance on compliant surfaces-EO and EC for improved balance    Consulted and Agree with Plan of Care Patient                  Patient will benefit from skilled therapeutic intervention in order to improve the following deficits and impairments:     Visit Diagnosis: Muscle weakness (generalized)  Unsteadiness on feet  Other abnormalities of gait and mobility     Problem List Patient Active Problem List   Diagnosis Date Noted  . Neuropathy 07/20/2019  . Hypomagnesemia   . Macrocytic anemia   . Bacteremia   . Debility 07/07/2019  . Acute respiratory failure with hypoxia ([place])   . Atelectasis   . Enterococcal bacteremia 06/26/2019  . AKI (acute kidney injury) ([place])  06/25/2019  . Perforated viscus 01/06/2019  . Severe sepsis with septic shock ([place]) 01/06/2019  . Acute renal failure ([place]) 01/06/2019  . Hyperkalemia 01/06/2019  . Hyponatremia 01/06/2019  . Lactic acidosis 01/06/2019  . Onychomycosis 05/19/2018  . Foot fracture, left, closed, initial encounter 05/09/2018  . Lisfranc dislocation, left, initial encounter   . Acute  bronchitis 02/25/2018  . Primary pulmonary hypertension ([place]) 07/09/2017  . Diastolic dysfunction 95/74/7340  . OSA (obstructive sleep apnea) 07/08/2017  . SOB (shortness of breath) 07/02/2017  . Chronic respiratory failure with hypoxia and hypercapnia ([place]) 07/02/2017  . Cardiomegaly 07/02/2017  . Exposure keratitis 02/17/2017  . Pedal edema 12/18/2016  . Transaminitis 12/18/2016  . Bilateral pes planus 12/18/2016  . Psoriasis-eczema overlap condition 07/30/2016  . High serum high density lipoprotein (HDL) 09/26/2015  . Epistaxis 09/26/2015  . Renal artery stenosis in 1 of 2 vessels ([place])   . Essential tremor   . Elevated PSA   . GERD (gastroesophageal reflux disease)   . Health maintenance examination 10/08/2014  . Anxiety state 10/08/2014  . Obesity, Class I, BMI 30-34.9   . Mild intermittent asthma in adult without complication   . Seasonal allergies   . HTN (hypertension)   . Scoliosis   . Osteoporosis     Saachi Zale W. 08/21/2019, 5:57 PM  Frazier Butt., PT   [facility] [address] [place], [place], [address] Phone: [phone]   Fax:  [phone]  Name: Louis [name] MRN: [id number] Date of Birth: [birth year]

## 2019-08-21 NOTE — Patient Instructions (Signed)
Access Code: [email or web address] URL: [email or web address] Date: 08/21/2019 Prepared by: Mady Haagensen  Exercises Sit to Stand with Arms Crossed - 1 x daily - 5 x weekly - 1 sets - 5-10 reps Heel Toe Raises with Counter Support - 1 x daily - 5 x weekly - 1 sets - 10 reps Walking March - 1 x daily - 5 x weekly - 1 sets - 3 reps Tandem Walking with Counter Support - 1 x daily - 5 x weekly - 1 sets - 3 reps Standing Near Stance in Corner with Eyes Closed - 1 x daily - 5 x weekly - 1 sets - 3 reps - 30 hold Standing Balance in Corner with Eyes Closed - 1 x daily - 5 x weekly - 1 sets - 10 reps Tandem Stance with Eyes Closed in Corner - 1 x daily - 5 x weekly - 1 sets - 3 reps - 30 hold Romberg Stance with Head Nods - 1 x daily - 5 x weekly - 2 sets - 10 reps  Added 08/21/2019 Alternating Step Taps with Counter Support - 1 x daily - 5 x weekly - 2 sets - 10 reps Forward Step Up - 1 x daily - 5 x weekly - 2 sets - 10 reps

## 2019-08-24 ENCOUNTER — Other Ambulatory Visit: Payer: Self-pay | Admitting: Family Medicine

## 2019-08-24 ENCOUNTER — Ambulatory Visit: Payer: 59 | Admitting: Physical Therapy

## 2019-08-24 ENCOUNTER — Other Ambulatory Visit: Payer: Self-pay

## 2019-08-24 DIAGNOSIS — R2689 Other abnormalities of gait and mobility: Secondary | ICD-10-CM

## 2019-08-24 DIAGNOSIS — R2681 Unsteadiness on feet: Secondary | ICD-10-CM

## 2019-08-24 DIAGNOSIS — M6281 Muscle weakness (generalized): Secondary | ICD-10-CM

## 2019-08-24 NOTE — Telephone Encounter (Signed)
Pt called about refill status/ advised pt about refills at pharmacy but the refill for folic acid (FOLVITE) 1 MG tablet Did not go through on 8.20.21/ the class says PRINT/ please resend to [facility]

## 2019-08-24 NOTE — Therapy (Signed)
[facility] [address] [place], [place], [address] Phone: [phone]   Fax:  [phone]  Physical Therapy Treatment  Patient Details  Name: Louis [name] MRN: [id number] Date of Birth: 03/20/1955 Referring Provider (PT): Marlowe Shores, PA-C Grant Fontana, MD)   Encounter Date: 08/24/2019   PT End of Session - 08/24/19 1649    Visit Number 9    Number of Visits 10    Authorization Type [employer], covered 100% as ded and OOPM met; VL:  30    PT Start Time 1019    PT Stop Time 1100    PT Time Calculation (min) 41 min    Equipment Utilized During Treatment Gait belt    Activity Tolerance Patient tolerated treatment well    Behavior During Therapy WFL for tasks assessed/performed           Past Medical History:  Diagnosis Date  . Anxiety    situational  . BPH (benign prostatic hyperplasia)    on flomax  . Bundle branch block    per prior PCP records  . CHF (congestive heart failure) ([place])   . COPD (chronic obstructive pulmonary disease) ([place])   . Diverticulosis    by colonoscopy  . Eczema    per prior PCP records  . Elevated PSA 2015   peaked 6s, s/p benign biopsy 2014, sees urology [name] ([place])  . Essential tremor   . GERD (gastroesophageal reflux disease)    per prior PCP records  . History of panic attacks    per prior PCP records  . HTN (hypertension)   . Mild intermittent asthma in adult without complication   . Obesity, Class I, BMI 30-34.9   . Osteoporosis    DEXA 06/2013 with osteopenia - h/o ?wrist/hip fracture from AVN from chronic steroid use (asthma), took reclast for 1 year  . Renal artery stenosis in 1 of 2 vessels ([place]) 2011   by [place]  . Seasonal allergies   . Sleep apnea   . Thoracic scoliosis childhood  . Transaminitis    per prior PCP records    Past Surgical History:  Procedure Laterality Date  . APPLICATION OF WOUND VAC Left 05/09/2018   Procedure: Application Of Wound  Vac;  Surgeon: Newt Minion, MD;  Location: [facility];  Service: Orthopedics;  Laterality: Left;  . BIOPSY  04/30/2019   Procedure: BIOPSY;  Surgeon: Juanita Craver, MD;  Location: [facility];  Service: Endoscopy;;  . COLONOSCOPY  12/2013   polyps, int hem, diverticulosis, rpt 5 yrs ([place])  . COLONOSCOPY WITH PROPOFOL N/A 04/30/2019   Procedure: COLONOSCOPY WITH PROPOFOL;  Surgeon: Juanita Craver, MD;  Location: [facility];  Service: Endoscopy;  Laterality: N/A;  . ELBOW SURGERY Right 2008   golfer's elbow  . ESOPHAGOGASTRODUODENOSCOPY (EGD) WITH PROPOFOL N/A 04/30/2019   Procedure: ESOPHAGOGASTRODUODENOSCOPY (EGD) WITH PROPOFOL;  Surgeon: Juanita Craver, MD;  Location: [facility];  Service: Endoscopy;  Laterality: N/A;  . INGUINAL HERNIA REPAIR Bilateral childhood & ~1995  . LAPAROTOMY N/A 01/05/2019   Procedure: EXPLORATORY LAPAROTOMY graham patch repair;  Surgeon: Benjamine Sprague, DO;  Location: [facility];  Service: General;  Laterality: N/A;  . NASAL SEPTUM SURGERY  2013   deviated septum  . ORIF ANKLE FRACTURE Left 05/09/2018   Procedure: OPEN REDUCTION INTERNAL FIXATION LEFT LISFRANC FRACTURE/DISLOCATION;  Surgeon: Newt Minion, MD;  Location: [facility];  Service: Orthopedics;  Laterality: Left;  . POLYPECTOMY  04/30/2019   Procedure: POLYPECTOMY;  Surgeon: [name],  Mar Daring, MD;  Location: [facility];  Service: Endoscopy;;  . TEE WITHOUT CARDIOVERSION N/A 06/29/2019   Procedure: TRANSESOPHAGEAL ECHOCARDIOGRAM (TEE);  Surgeon: Acie Fredrickson Wonda Cheng, MD;  Location: [facility];  Service: Cardiovascular;  Laterality: N/A;  . US ECHOCARDIOGRAPHY  02/2009   WNL, EF >55% [facility])  . US RENAL/AORTA Right 05/2009   1-59% diameter reduction renal artery, rec rpt 2 yrs    There were no vitals filed for this visit.   Subjective Assessment - 08/24/19 1019    Subjective Pretty eventful weekend-had a funeral (was pretty level ground) and then back in the pulpit for the first time Sunday.  No problems.  Bad news is  hernia is pretty large-they will do CT scan and need to talk to surgeon.  He does state no restrictions and continue to exercise.    Pertinent History L foot fracture, sepsis with septic shock, acute renal railure, OSA, GERD, anxiety, HTN, restrictive lung disease with scoliosis, neuropathy    Patient Stated Goals Pt's goal for therapy are for strengthening and endurance.    Currently in Pain? No/denies              [facility] PT Assessment - 08/24/19 0001      Dynamic Gait Index   Level Surface Mild Impairment   6.94   Change in Gait Speed Normal    Gait with Horizontal Head Turns Normal    Gait with Vertical Head Turns Normal    Gait and Pivot Turn Normal    Step Over Obstacle Mild Impairment    Step Around Obstacles Normal    Steps Normal    Total Score 22    DGI comment: Improved from 17/24 at eval      Functional Gait  Assessment   Gait assessed  Yes    Gait Level Surface Walks 20 ft in less than 7 sec but greater than 5.5 sec, uses assistive device, slower speed, mild gait deviations, or deviates 6-10 in outside of the 12 in walkway width.    Change in Gait Speed Able to smoothly change walking speed without loss of balance or gait deviation. Deviate no more than 6 in outside of the 12 in walkway width.    Gait with Horizontal Head Turns Performs head turns smoothly with no change in gait. Deviates no more than 6 in outside 12 in walkway width    Gait with Vertical Head Turns Performs head turns with no change in gait. Deviates no more than 6 in outside 12 in walkway width.    Gait and Pivot Turn Pivot turns safely within 3 sec and stops quickly with no loss of balance.    Step Over Obstacle Is able to step over one shoe box (4.5 in total height) but must slow down and adjust steps to clear box safely. May require verbal cueing.    Gait with Narrow Base of Support Ambulates less than 4 steps heel to toe or cannot perform without assistance.    Gait with Eyes Closed Walks 20 ft, uses  assistive device, slower speed, mild gait deviations, deviates 6-10 in outside 12 in walkway width. Ambulates 20 ft in less than 9 sec but greater than 7 sec.   8.96 veer to L   Ambulating Backwards Walks 20 ft, uses assistive device, slower speed, mild gait deviations, deviates 6-10 in outside 12 in walkway width.   16.22   Steps Alternating feet, no rail.    Total Score 22    FGA comment:  Scores <22/30 indicate increased fall risk                         [facility] Adult PT Treatment/Exercise - 08/24/19 0001      Transfers   Transfers Sit to Stand;Stand to Sit    Sit to Stand 5: Supervision;Without upper extremity assist;From chair/3-in-1    Five time sit to stand comments  10.66    Stand to Sit 5: Supervision;Without upper extremity assist;To chair/3-in-1      Ambulation/Gait   Ambulation/Gait Yes    Ambulation/Gait Assistance 5: Supervision    Ambulation Distance (Feet) --   in gym between activities, in addition to DGI and FGA   Assistive device None    Gait Pattern Step-through pattern;Wide base of support    Ambulation Surface Level;Indoor    Stairs Yes    Stairs Assistance 6: Modified independent (Device/Increase time)    Stair Management Technique No rails;Alternating pattern;Forwards   Reports difficulty going down steps carrying item BUEs     Self-Care   Self-Care Other Self-Care Comments    Other Self-Care Comments  REviewed fall prevention and discussed progress towards goals, POC.  Pt feels strengthening is coming and balance may be his first priority.  One goal would be going down steps carrying items in bilateral hands.               Balance Exercises - 08/24/19 0001      Balance Exercises: Standing   Standing Eyes Closed Wide (BOA);Narrow base of support (BOS);Foam/compliant surface;Head turns;5 reps   Head nods    Standing Eyes Closed Limitations min guard assistance provided; intermittent UE support    Other Standing Exercises Standing on  incline/decline on blue mat, using walking pole for support:  EO with head turns/nods x 5 reps, EC x 10 seconds head steady; then performed marching in place x 10 reps.  Then on downward slope, performed forward step taps x 10 reps.  PT provides min guard assist throughout.           Forward step downs from 6" step, x 10 reps each leg leading, BUE support.   PT Education - 08/24/19 1649    Education Details Progress towards goals, POC    Person(s) Educated Patient    Methods Explanation    Comprehension Verbalized understanding               PT Long Term Goals - 08/24/19 1022      PT LONG TERM GOAL #1   Title Pt will be independent with HEP for improved strength, balance, transfers, and gait.  TARGET 08/28/2019 (may be extended 1 additional week, due to pt out of town)    Time 5    Period Weeks    Status New      PT LONG TERM GOAL #2   Title Pt will improve 5x sit<>stand to less than or equal to 20 sec to demonstrate improved functional strength and transfer efficiency.    Baseline 29.87 sec; 10.66 08/24/2019    Time 5    Period Weeks    Status Achieved      PT LONG TERM GOAL #3   Title Pt will improve DGI score to at least 20/24 to decrease fall risk.    Baseline 17/24; 22/24 08/24/2019    Time 5    Period Weeks    Status Achieved      PT LONG TERM GOAL #4   Title  Pt will improve distance ambulated in 3 MWT to at least 610 ft for improved gait efficiency with community gait.    Baseline 563 ft in 3 minutes; 683 ft in 3 minutes 08/21/2019    Time 5    Period Weeks    Status Achieved      PT LONG TERM GOAL #5   Title Pt stand at least 30 seconds EC on foam, demonstrating improved vestibular input/use for balance.    Baseline 10 sec foam, EC with min guard and incr. sway; 30 sec 08/24/2019    Time 5    Period Weeks    Status Achieved      PT LONG TERM GOAL #6   Title Pt will verbalize understanding of fall prevention in home environment.    Time 5    Period Weeks      Status Achieved                 Plan - 08/24/19 1651    Clinical Impression Statement Continued assessing LTGs this visit, with pt meeting LTG 2, 3, 4, 5, 6.  Pt has demonstrated significant improvement in functional strength with improved 5x sit<>stand score to 10.66 sec; he has improve DGI score to 22/24.  In addition, PT completed FGA today, with pt borderline fall risk of 22/30.  He will continue to benefit from additional skilled therapy to address higher level balance, gait training for improved overall functional mobility.    Personal Factors and Comorbidities Comorbidity 3+    Comorbidities history of familial tremors, restrictive lung disease with scoliosis, perforated duodenal ulcer, hepatic steatosis, moderate to excessive alcohol use, left Lisfranc fracture with dislocations s/p ORIF with onset of neuropathy, B12 deficiency, gait disorder with history of falls; R 5th metatarsal fx s/p fall 06/2019    Examination-Activity Limitations Locomotion Level;Transfers;Stand;Stairs    Examination-Participation Restrictions Church;Community Activity;Other   Fly fishing, golf, pilates   Stability/Clinical Decision Making Evolving/Moderate complexity    Rehab Potential Good    PT Frequency Other (comment)   1x/wk for 1 week, then 2x/wk for 4 weeks   PT Duration Other (comment)   5 weeks total POC   PT Treatment/Interventions ADLs/Self Care Home Management;Gait training;Stair training;Functional mobility training;Therapeutic activities;Therapeutic exercise;Balance training;Neuromuscular re-education;Patient/family education    PT Next Visit Plan COMPLETE RECERT next visit; Check LTG for HEP (need to formally recheck HEP and perhaps update); continue compliant surfaces-EO and EC for improved balance; SLS activities standing on compliant surface.    Consulted and Agree with Plan of Care Patient           Patient will benefit from skilled therapeutic intervention in order to improve the  following deficits and impairments:  Abnormal gait, Difficulty walking, Decreased endurance, Decreased activity tolerance, Decreased balance, Decreased mobility, Decreased strength, Postural dysfunction  Visit Diagnosis: Other abnormalities of gait and mobility  Unsteadiness on feet  Muscle weakness (generalized)     Problem List Patient Active Problem List   Diagnosis Date Noted  . Neuropathy 07/20/2019  . Hypomagnesemia   . Macrocytic anemia   . Bacteremia   . Debility 07/07/2019  . Acute respiratory failure with hypoxia ([place])   . Atelectasis   . Enterococcal bacteremia 06/26/2019  . AKI (acute kidney injury) ([place]) 06/25/2019  . Perforated viscus 01/06/2019  . Severe sepsis with septic shock ([place]) 01/06/2019  . Acute renal failure ([place]) 01/06/2019  . Hyperkalemia 01/06/2019  . Hyponatremia 01/06/2019  . Lactic acidosis 01/06/2019  . Onychomycosis 05/19/2018  .  Foot fracture, left, closed, initial encounter 05/09/2018  . Lisfranc dislocation, left, initial encounter   . Acute bronchitis 02/25/2018  . Primary pulmonary hypertension ([place]) 07/09/2017  . Diastolic dysfunction 09/98/3382  . OSA (obstructive sleep apnea) 07/08/2017  . SOB (shortness of breath) 07/02/2017  . Chronic respiratory failure with hypoxia and hypercapnia ([place]) 07/02/2017  . Cardiomegaly 07/02/2017  . Exposure keratitis 02/17/2017  . Pedal edema 12/18/2016  . Transaminitis 12/18/2016  . Bilateral pes planus 12/18/2016  . Psoriasis-eczema overlap condition 07/30/2016  . High serum high density lipoprotein (HDL) 09/26/2015  . Epistaxis 09/26/2015  . Renal artery stenosis in 1 of 2 vessels ([place])   . Essential tremor   . Elevated PSA   . GERD (gastroesophageal reflux disease)   . Health maintenance examination 10/08/2014  . Anxiety state 10/08/2014  . Obesity, Class I, BMI 30-34.9   . Mild intermittent asthma in adult without complication   . Seasonal allergies   . HTN (hypertension)   .  Scoliosis   . Osteoporosis     MARRIOTT,AMY W. 08/24/2019, 4:56 PM  Frazier Butt., PT   [facility] [address] [place], [place], [address] Phone: [phone]   Fax:  [phone]  Name: Louis [name] MRN: [id number] Date of Birth: 12/17/1955

## 2019-08-25 NOTE — Telephone Encounter (Signed)
[name] please advise on patient email.  If you need to print a download please let one of the nurses/cma's know.  Thanks!   I Louis [name] on July 19th. The plan is for me to return on September 1 to review data from a download of my CPAP machine. I have had events > 5 for the majority of the last 23 days of data that I can see. (High of 16/hr)  I wonder if an adjustment is recommended now or whether I should wait for my appointment next Monday (9/1). Thanks. Louis [name]

## 2019-08-26 ENCOUNTER — Other Ambulatory Visit: Payer: Self-pay

## 2019-08-26 ENCOUNTER — Other Ambulatory Visit: Payer: Self-pay | Admitting: Surgery

## 2019-08-26 DIAGNOSIS — K432 Incisional hernia without obstruction or gangrene: Secondary | ICD-10-CM

## 2019-08-26 DIAGNOSIS — D539 Nutritional anemia, unspecified: Secondary | ICD-10-CM

## 2019-08-26 MED ORDER — FOLIC ACID 1 MG PO TABS: 1.0000 mg | ORAL_TABLET | Freq: Every day | ORAL | 0 refills | Status: DC

## 2019-08-27 ENCOUNTER — Ambulatory Visit: Payer: 59 | Admitting: Physical Therapy

## 2019-08-28 ENCOUNTER — Ambulatory Visit: Payer: 59 | Admitting: Physical Therapy

## 2019-08-28 ENCOUNTER — Other Ambulatory Visit: Payer: Self-pay

## 2019-08-28 VITALS — BP 132/75 | HR 83

## 2019-08-28 DIAGNOSIS — M6281 Muscle weakness (generalized): Secondary | ICD-10-CM

## 2019-08-28 DIAGNOSIS — R2689 Other abnormalities of gait and mobility: Secondary | ICD-10-CM | POA: Diagnosis not present

## 2019-08-28 DIAGNOSIS — R2681 Unsteadiness on feet: Secondary | ICD-10-CM

## 2019-08-28 NOTE — Patient Instructions (Signed)
Access Code: [email or web address] URL: [email or web address] Date: 08/28/2019 Prepared by: Mady Haagensen  Exercises Sit to Stand with Arms Crossed - 1 x daily - 5 x weekly - 2-3 sets - 10 reps Heel Toe Raises with Counter Support - 1 x daily - 5 x weekly - 3 sets - 10 reps Walking March - 1 x daily - 5 x weekly - 1 sets - 3 reps Tandem Walking with Counter Support - 1 x daily - 5 x weekly - 1 sets - 3 reps Standing Near Stance in Corner with Eyes Closed - 1 x daily - 5 x weekly - 1 sets - 3 reps - 30 hold Standing Balance in Corner with Eyes Closed - 1 x daily - 5 x weekly - 1 sets - 10 reps Tandem Stance with Eyes Closed in Corner - 1 x daily - 5 x weekly - 1 sets - 3 reps - 30 hold Romberg Stance with Head Nods - 1 x daily - 5 x weekly - 2 sets - 10 reps Alternating Step Taps with Counter Support - 1 x daily - 5 x weekly - 2-3 sets - 10 reps Forward Step Up - 1 x daily - 5 x weekly - 2-3 sets - 10 reps

## 2019-08-28 NOTE — Therapy (Signed)
[facility] [address] [place], [place], [address] Phone: [phone]   Fax:  [phone]  Physical Therapy Treatment  Patient Details  Name: Louis [name] MRN: [id number] Date of Birth: [birth year] Referring Provider (PT): Marlowe Shores, PA-C Grant Fontana, MD)   Encounter Date: 08/28/2019   PT End of Session - 08/28/19 0719    Visit Number 10    Number of Visits 20   per recert 6/81/2751   Authorization Type [employer], covered 100% as ded and OOPM met; VL:  30    PT Start Time 0718    PT Stop Time 0758    PT Time Calculation (min) 40 min    Equipment Utilized During Treatment Gait belt    Activity Tolerance Patient tolerated treatment well    Behavior During Therapy [facility] for tasks assessed/performed           Past Medical History:  Diagnosis Date  . Anxiety    situational  . BPH (benign prostatic hyperplasia)    on flomax  . Bundle branch block    per prior PCP records  . CHF (congestive heart failure) ([place])   . COPD (chronic obstructive pulmonary disease) ([place])   . Diverticulosis    by colonoscopy  . Eczema    per prior PCP records  . Elevated PSA 2015   peaked 6s, s/p benign biopsy 2014, sees urology [name] ([place])  . Essential tremor   . GERD (gastroesophageal reflux disease)    per prior PCP records  . History of panic attacks    per prior PCP records  . HTN (hypertension)   . Mild intermittent asthma in adult without complication   . Obesity, Class I, BMI 30-34.9   . Osteoporosis    DEXA 06/2013 with osteopenia - h/o ?wrist/hip fracture from AVN from chronic steroid use (asthma), took reclast for 1 year  . Renal artery stenosis in 1 of 2 vessels ([place]) 2011   by [place]  . Seasonal allergies   . Sleep apnea   . Thoracic scoliosis childhood  . Transaminitis    per prior PCP records    Past Surgical History:  Procedure Laterality Date  . APPLICATION OF WOUND VAC Left 05/09/2018    Procedure: Application Of Wound Vac;  Surgeon: Newt Minion, MD;  Location: [facility];  Service: Orthopedics;  Laterality: Left;  . BIOPSY  04/30/2019   Procedure: BIOPSY;  Surgeon: Juanita Craver, MD;  Location: [facility];  Service: Endoscopy;;  . COLONOSCOPY  12/2013   polyps, int hem, diverticulosis, rpt 5 yrs ([place])  . COLONOSCOPY WITH PROPOFOL N/A 04/30/2019   Procedure: COLONOSCOPY WITH PROPOFOL;  Surgeon: Juanita Craver, MD;  Location: [facility];  Service: Endoscopy;  Laterality: N/A;  . ELBOW SURGERY Right 2008   golfer's elbow  . ESOPHAGOGASTRODUODENOSCOPY (EGD) WITH PROPOFOL N/A 04/30/2019   Procedure: ESOPHAGOGASTRODUODENOSCOPY (EGD) WITH PROPOFOL;  Surgeon: Juanita Craver, MD;  Location: [facility];  Service: Endoscopy;  Laterality: N/A;  . INGUINAL HERNIA REPAIR Bilateral childhood & ~1995  . LAPAROTOMY N/A 01/05/2019   Procedure: EXPLORATORY LAPAROTOMY graham patch repair;  Surgeon: Benjamine Sprague, DO;  Location: [facility];  Service: General;  Laterality: N/A;  . NASAL SEPTUM SURGERY  2013   deviated septum  . ORIF ANKLE FRACTURE Left 05/09/2018   Procedure: OPEN REDUCTION INTERNAL FIXATION LEFT LISFRANC FRACTURE/DISLOCATION;  Surgeon: Newt Minion, MD;  Location: [facility];  Service: Orthopedics;  Laterality: Left;  . POLYPECTOMY  04/30/2019   Procedure:  POLYPECTOMY;  Surgeon: Juanita Craver, MD;  Location: [facility];  Service: Endoscopy;;  . TEE WITHOUT CARDIOVERSION N/A 06/29/2019   Procedure: TRANSESOPHAGEAL ECHOCARDIOGRAM (TEE);  Surgeon: Acie Fredrickson Wonda Cheng, MD;  Location: [facility] ENDOSCOPY;  Service: Cardiovascular;  Laterality: N/A;  . US ECHOCARDIOGRAPHY  02/2009   WNL, EF >55% [facility])  . US RENAL/AORTA Right 05/2009   1-59% diameter reduction renal artery, rec rpt 2 yrs    Vitals:   08/28/19 0739  BP: 132/75  Pulse: 83  SpO2: 99%     Subjective Assessment - 08/28/19 0718    Subjective No changes, no pain.  A little soreness in my muscles from working out.    Pertinent History L  foot fracture, sepsis with septic shock, acute renal railure, OSA, GERD, anxiety, HTN, restrictive lung disease with scoliosis, neuropathy    Patient Stated Goals Pt's goal for therapy are for strengthening and endurance.    Currently in Pain? No/denies              [facility] PT Assessment - 08/28/19 0001      6 Minute Walk- Baseline   6 Minute Walk- Baseline yes    BP (mmHg) 132/75    HR (bpm) 83    02 Sat (%RA) 99 %      6 Minute walk- Post Test   6 Minute Walk Post Test yes    BP (mmHg) 127/76    HR (bpm) 84    02 Sat (%RA) 98 %    Modified Borg Scale for Dyspnea 0- Nothing at all    Perceived Rate of Exertion (Borg) 9- very light      6 minute walk test results    Aerobic Endurance Distance Walked 1246    Endurance additional comments 200 ft first minute, 210 ft last minute             Reviewed full HEP, and made modifications as noted in bold below.  Pt return demo understanding.    . Sit to Stand with Arms Crossed - 1 x daily - 5 x weekly - 2-3 sets - 10 reps (Performed x 10 today) . Heel Toe Raises with Counter Support - 1 x daily - 5 x weekly - 3 sets - 10 reps (Performed x 10 today) . Walking March - 1 x daily - 5 x weekly - 1 sets - 3 reps . Tandem Walking with Counter Support - 1 x daily - 5 x weekly - 1 sets - 3 reps  (Progression of tandem march along counter with UE support) . Standing Near Stance in [place] with Eyes Closed - 1 x daily - 5 x weekly - 1 sets - 3 reps - 30 hold . Standing Balance in Corner with Eyes Closed - 1 x daily - 5 x weekly - 1 sets - 10 reps . Tandem Stance with Eyes Closed in Corner - 1 x daily - 5 x weekly - 1 sets - 3 reps - 30 hold . Romberg Stance with Head Nods - 1 x daily - 5 x weekly - 2 sets - 10 reps . Alternating Step Taps with Counter Support - 1 x daily - 5 x weekly - 2-3 sets - 10 reps (performed x 10 reps today) Forward Step Up - 1 x daily - 5 x weekly - 2-3 sets - 10 reps (performed 5 reps step up/up, down/down; 5 reps  single step up)    Educated pt in increasing number of sets of exercises to  help in overall muscle strength and endurance.            Balance Exercises - 08/28/19 0001      Balance Exercises: Standing   Stepping Strategy Foam/compliant surface;Lateral;UE support;10 reps;Anterior;Posterior    Marching Foam/compliant surface;Upper extremity assist 2;Static;10 reps           Self Care:  Discussed again POC and pt's goals; BP measures/vitals assessed pre/post 6 MWT  PT Education - 08/28/19 1025    Education Details POC and updates to HEP (see instructions/MedBridge    Person(s) Educated Patient    Methods Explanation;Demonstration;Handout    Comprehension Verbalized understanding;Returned demonstration               PT Long Term Goals - 08/28/19 1102      PT LONG TERM GOAL #1   Title Pt will be independent with HEP for improved strength, balance, transfers, and gait.  TARGET 08/28/2019 (may be extended 1 additional week, due to pt out of town)    Time 5    Period Weeks    Status Achieved      PT LONG TERM GOAL #2   Title Pt will improve 5x sit<>stand to less than or equal to 20 sec to demonstrate improved functional strength and transfer efficiency.    Baseline 29.87 sec; 10.66 08/24/2019    Time 5    Period Weeks    Status Achieved      PT LONG TERM GOAL #3   Title Pt will improve DGI score to at least 20/24 to decrease fall risk.    Baseline 17/24; 22/24 08/24/2019    Time 5    Period Weeks    Status Achieved      PT LONG TERM GOAL #4   Title Pt will improve distance ambulated in 3 MWT to at least 610 ft for improved gait efficiency with community gait.    Baseline 563 ft in 3 minutes; 683 ft in 3 minutes 08/21/2019    Time 5    Period Weeks    Status Achieved      PT LONG TERM GOAL #5   Title Pt stand at least 30 seconds EC on foam, demonstrating improved vestibular input/use for balance.    Baseline 10 sec foam, EC with min guard and incr. sway; 30  sec 08/24/2019    Time 5    Period Weeks    Status Achieved      PT LONG TERM GOAL #6   Title Pt will verbalize understanding of fall prevention in home environment.    Time 5    Period Weeks    Status Achieved                 Plan - 08/28/19 1102    Clinical Impression Statement LTG 1 met this visit, with pt return demo understanding of HEP; made a few additions to HEP, including tandem marching, and instructed pt to increase most exercises to 3 sets of 10 reps.  6 MWT 1246 ft, which is under age-related norms (1876 ft).  He is just beginning to transition to walking on neighborhood surfaces with walking pole instead of rollator.  Based on FGA score last visit, being at fall risk, pt will continue to benefit from continued skilled PT to address strength, endurance, balance, and gait training for improved overall balance and mobility and decreased fall risk.    Personal Factors and Comorbidities Comorbidity 3+    Comorbidities history of familial tremors, restrictive  lung disease with scoliosis, perforated duodenal ulcer, hepatic steatosis, moderate to excessive alcohol use, left Lisfranc fracture with dislocations s/p ORIF with onset of neuropathy, B12 deficiency, gait disorder with history of falls; R 5th metatarsal fx s/p fall 06/2019    Examination-Activity Limitations Locomotion Level;Transfers;Stand;Stairs    Examination-Participation Restrictions Church;Community Activity;Other   Fly fishing, golf, pilates   Stability/Clinical Decision Making Evolving/Moderate complexity    Rehab Potential Good    PT Frequency 2x / week    PT Duration Other (comment)   5 weeks, per recert 5/46/2703   PT Treatment/Interventions ADLs/Self Care Home Management;Gait training;Stair training;Functional mobility training;Therapeutic activities;Therapeutic exercise;Balance training;Neuromuscular re-education;Patient/family education    PT Next Visit Plan Continue compliant surfaces, SLS activities on  compliant surfaces; stair negotiation with carrying items; dynamic gait activities on unlevel surfaces    Consulted and Agree with Plan of Care Patient           Patient will benefit from skilled therapeutic intervention in order to improve the following deficits and impairments:  Abnormal gait, Difficulty walking, Decreased endurance, Decreased activity tolerance, Decreased balance, Decreased mobility, Decreased strength, Postural dysfunction  Visit Diagnosis: Other abnormalities of gait and mobility  Unsteadiness on feet  Muscle weakness (generalized)     Problem List Patient Active Problem List   Diagnosis Date Noted  . Neuropathy 07/20/2019  . Hypomagnesemia   . Macrocytic anemia   . Bacteremia   . Debility 07/07/2019  . Acute respiratory failure with hypoxia ([place])   . Atelectasis   . Enterococcal bacteremia 06/26/2019  . AKI (acute kidney injury) ([place]) 06/25/2019  . Perforated viscus 01/06/2019  . Severe sepsis with septic shock ([place]) 01/06/2019  . Acute renal failure ([place]) 01/06/2019  . Hyperkalemia 01/06/2019  . Hyponatremia 01/06/2019  . Lactic acidosis 01/06/2019  . Onychomycosis 05/19/2018  . Foot fracture, left, closed, initial encounter 05/09/2018  . Lisfranc dislocation, left, initial encounter   . Acute bronchitis 02/25/2018  . Primary pulmonary hypertension ([place]) 07/09/2017  . Diastolic dysfunction 50/09/3816  . OSA (obstructive sleep apnea) 07/08/2017  . SOB (shortness of breath) 07/02/2017  . Chronic respiratory failure with hypoxia and hypercapnia ([place]) 07/02/2017  . Cardiomegaly 07/02/2017  . Exposure keratitis 02/17/2017  . Pedal edema 12/18/2016  . Transaminitis 12/18/2016  . Bilateral pes planus 12/18/2016  . Psoriasis-eczema overlap condition 07/30/2016  . High serum high density lipoprotein (HDL) 09/26/2015  . Epistaxis 09/26/2015  . Renal artery stenosis in 1 of 2 vessels ([place])   . Essential tremor   . Elevated PSA   . GERD  (gastroesophageal reflux disease)   . Health maintenance examination 10/08/2014  . Anxiety state 10/08/2014  . Obesity, Class I, BMI 30-34.9   . Mild intermittent asthma in adult without complication   . Seasonal allergies   . HTN (hypertension)   . Scoliosis   . Osteoporosis     Kennia Vanvorst W. 08/28/2019, 12:16 PM Frazier Butt., PT [facility] [address] [place], [place], [address] Phone: [phone]   Fax:  [phone]  Name: Louis [name] MRN: [id number] Date of Birth: [birth year]    PT Long Term Goals - 08/28/19 1221      PT LONG TERM GOAL #1   Title Pt will be independent with progression of HEP for improved strength, balance, transfers, and gait.  TARGET 10/02/2019    Time 5    Period Weeks    Status Revised      PT LONG TERM GOAL #2  Title Pt will improve FGA score to at least 25/30 for decreased fall risk with improved dynamic gait.    Baseline FGA 22/30    Time 5    Period Weeks    Status New      PT LONG TERM GOAL #3   Title Pt will improve 6 MWT score to at least 1400 ft for improved gait efficiency and endurance.    Baseline 1246 ft    Time 5    Period Weeks    Status New      PT LONG TERM GOAL #4   Title Pt will negotiate at least 4 steps carrying object in both hands, modified independently, for improved stair negotiation in and out of home.    Time 5    Period Weeks    Status New      PT LONG TERM GOAL #5   Title Pt will report ambulating in his neighborhood at least 20 minutes using walking pole only, for improved gait independence.    Time 5    Period Weeks    Status Karoline Caldwell, [place] 08/28/19 12:26 PM Phone: [phone] Fax: [phone]

## 2019-08-31 ENCOUNTER — Ambulatory Visit: Payer: 59 | Admitting: Physical Therapy

## 2019-08-31 ENCOUNTER — Ambulatory Visit (INDEPENDENT_AMBULATORY_CARE_PROVIDER_SITE_OTHER): Payer: 59 | Admitting: Neurology

## 2019-08-31 ENCOUNTER — Encounter: Payer: Self-pay | Admitting: Neurology

## 2019-08-31 DIAGNOSIS — G603 Idiopathic progressive neuropathy: Secondary | ICD-10-CM | POA: Diagnosis not present

## 2019-08-31 DIAGNOSIS — G629 Polyneuropathy, unspecified: Secondary | ICD-10-CM

## 2019-08-31 NOTE — Progress Notes (Signed)
[place]    Nerve / Sites Muscle Latency Ref. Amplitude Ref. Rel Amp Segments Distance Velocity Ref. Area    ms ms mV mV %  cm m/s m/s mVms  R Peroneal - EDB     Ankle EDB NR ?6.5 NR ?2.0 NR Ankle - EDB 9   NR     Fib head EDB NR  NR  NR Fib head - Ankle 31 NR ?44 NR     Pop fossa EDB NR  NR  NR Pop fossa - Fib head 10 NR ?44 NR         Pop fossa - Ankle      L Peroneal - EDB     Ankle EDB 5.5 ?6.5 0.2 ?2.0 100 Ankle - EDB 9   1.0     Fib head EDB 15.6  0.1  58.9 Fib head - Ankle 30 30 ?44 0.6     Pop fossa EDB 18.6  0.1  94.6 Pop fossa - Fib head 10 33 ?44 0.6         Pop fossa - Ankle      R Tibial - AH     Ankle AH 4.9 ?5.8 0.6 ?4.0 100 Ankle - AH 9   2.0     Pop fossa AH 20.1  0.5  79.2 Pop fossa - Ankle 43 28 ?41 1.8  L Tibial - AH     Ankle AH 5.8 ?5.8 0.1 ?4.0 100 Ankle - AH 9   0.7     Pop fossa AH 20.8  0.1  87.2 Pop fossa - Ankle 42 28 ?41 0.4             SNC    Nerve / Sites Rec. Site Peak Lat Ref.  Amp Ref. Segments Distance    ms ms V V  cm  R Sural - Ankle (Calf)     Calf Ankle NR ?4.4 NR ?6 Calf - Ankle 14  L Sural - Ankle (Calf)     Calf Ankle NR ?4.4 NR ?6 Calf - Ankle 14  R Superficial peroneal - Ankle     Lat leg Ankle NR ?4.4 NR ?6 Lat leg - Ankle 14  L Superficial peroneal - Ankle     Lat leg Ankle NR ?4.4 NR ?6 Lat leg - Ankle 14             F  Wave    Nerve F Lat Ref.   ms ms  R Tibial - AH 70.5 ?56.0  L Tibial - AH NR ?56.0

## 2019-08-31 NOTE — Progress Notes (Signed)
Please refer to EMG and nerve conduction procedure note.  

## 2019-08-31 NOTE — Procedures (Signed)
     HISTORY:  Louis [name] is a 64 year old gentleman with a history of numbness in the feet and gait instability, he is being evaluated for a possible neuropathy.  He has recently had a hospitalization with a severe metabolic disturbance requiring intensive care unit therapy, his symptoms in the legs worsened with this hospitalization.  NERVE CONDUCTION STUDIES:  Nerve conduction studies were performed on both lower extremities.  No response was seen for the right peroneal nerve.  The distal motor latencies for the left peroneal nerve and for the posterior tibial nerves bilaterally were normal with low motor amplitudes seen for these nerves.  Slowing was seen for the left peroneal nerve and for the posterior tibial nerves bilaterally.  No response was seen for the sural or peroneal sensory latencies bilaterally.  The F-wave latency for the right posterior tibial nerve was prolonged, unobtainable on the left.  EMG STUDIES:  EMG study was performed on the right lower extremity:  The tibialis anterior muscle reveals 2 to 3K motor units with decreased recruitment. No fibrillations or positive waves were seen. The peroneus tertius muscle reveals 1 to 3K motor units with significantly decreased recruitment. No fibrillations or positive waves were seen. The medial gastrocnemius muscle reveals 1 to 3K motor units with decreased recruitment. No fibrillations or positive waves were seen. The vastus lateralis muscle reveals 2 to 4K motor units with full recruitment. No fibrillations or positive waves were seen. The iliopsoas muscle reveals 2 to 4K motor units with full recruitment. No fibrillations or positive waves were seen. The biceps femoris muscle (long head) reveals 2 to 4K motor units with full recruitment. No fibrillations or positive waves were seen. The lumbosacral paraspinal muscles were tested at 3 levels, and revealed no abnormalities of insertional activity at all 3 levels tested. There was  good relaxation.   IMPRESSION:  Nerve conduction studies done on both lower extremities showed evidence of a primarily axonal peripheral neuropathy of moderate to severe severity.  The EMG evaluation of the right lower extremity shows chronic neuropathic signs of denervation distally consistent with a diagnosis of peripheral neuropathy.  There is no clear evidence of an overlying lumbosacral radiculopathy.  Jill Alexanders MD 08/31/2019 3:37 PM  [facility] [address] [place], [place] [address]  Phone [phone] Fax [phone]

## 2019-09-01 ENCOUNTER — Other Ambulatory Visit: Payer: Self-pay

## 2019-09-01 ENCOUNTER — Ambulatory Visit: Payer: 59 | Admitting: Physical Therapy

## 2019-09-01 DIAGNOSIS — R2681 Unsteadiness on feet: Secondary | ICD-10-CM

## 2019-09-01 DIAGNOSIS — R2689 Other abnormalities of gait and mobility: Secondary | ICD-10-CM | POA: Diagnosis not present

## 2019-09-01 NOTE — Therapy (Signed)
[facility] [address] [place], [place], [address] Phone: [phone]   Fax:  [phone]  Physical Therapy Treatment  Patient Details  Name: Louis [name] MRN: [id number] Date of Birth: 08/24/1955 Referring Provider (PT): Marlowe Shores, PA-C Grant Fontana, MD)   Encounter Date: 09/01/2019   PT End of Session - 09/01/19 0928    Visit Number 11    Number of Visits 20   per recert 0/93/2671   Authorization Type [employer], covered 100% as ded and OOPM met; VL:  30    PT Start Time 0847    PT Stop Time 0928    PT Time Calculation (min) 41 min    Equipment Utilized During Treatment Gait belt    Activity Tolerance Patient tolerated treatment well    Behavior During Therapy [facility] for tasks assessed/performed           Past Medical History:  Diagnosis Date  . Anxiety    situational  . BPH (benign prostatic hyperplasia)    on flomax  . Bundle branch block    per prior PCP records  . CHF (congestive heart failure) ([place])   . COPD (chronic obstructive pulmonary disease) ([place])   . Diverticulosis    by colonoscopy  . Eczema    per prior PCP records  . Elevated PSA 2015   peaked 6s, s/p benign biopsy 2014, sees urology [name] ([place])  . Essential tremor   . GERD (gastroesophageal reflux disease)    per prior PCP records  . History of panic attacks    per prior PCP records  . HTN (hypertension)   . Mild intermittent asthma in adult without complication   . Obesity, Class I, BMI 30-34.9   . Osteoporosis    DEXA 06/2013 with osteopenia - h/o ?wrist/hip fracture from AVN from chronic steroid use (asthma), took reclast for 1 year  . Renal artery stenosis in 1 of 2 vessels ([place]) 2011   by [place]  . Seasonal allergies   . Sleep apnea   . Thoracic scoliosis childhood  . Transaminitis    per prior PCP records    Past Surgical History:  Procedure Laterality Date  . APPLICATION OF WOUND VAC Left 05/09/2018    Procedure: Application Of Wound Vac;  Surgeon: Newt Minion, MD;  Location: [facility];  Service: Orthopedics;  Laterality: Left;  . BIOPSY  04/30/2019   Procedure: BIOPSY;  Surgeon: Juanita Craver, MD;  Location: [facility];  Service: Endoscopy;;  . COLONOSCOPY  12/2013   polyps, int hem, diverticulosis, rpt 5 yrs ([place])  . COLONOSCOPY WITH PROPOFOL N/A 04/30/2019   Procedure: COLONOSCOPY WITH PROPOFOL;  Surgeon: Juanita Craver, MD;  Location: [facility];  Service: Endoscopy;  Laterality: N/A;  . ELBOW SURGERY Right 2008   golfer's elbow  . ESOPHAGOGASTRODUODENOSCOPY (EGD) WITH PROPOFOL N/A 04/30/2019   Procedure: ESOPHAGOGASTRODUODENOSCOPY (EGD) WITH PROPOFOL;  Surgeon: Juanita Craver, MD;  Location: [facility];  Service: Endoscopy;  Laterality: N/A;  . INGUINAL HERNIA REPAIR Bilateral childhood & ~1995  . LAPAROTOMY N/A 01/05/2019   Procedure: EXPLORATORY LAPAROTOMY graham patch repair;  Surgeon: Benjamine Sprague, DO;  Location: [facility];  Service: General;  Laterality: N/A;  . NASAL SEPTUM SURGERY  2013   deviated septum  . ORIF ANKLE FRACTURE Left 05/09/2018   Procedure: OPEN REDUCTION INTERNAL FIXATION LEFT LISFRANC FRACTURE/DISLOCATION;  Surgeon: Newt Minion, MD;  Location: [facility];  Service: Orthopedics;  Laterality: Left;  . POLYPECTOMY  04/30/2019   Procedure:  POLYPECTOMY;  Surgeon: Juanita Craver, MD;  Location: [facility];  Service: Endoscopy;;  . TEE WITHOUT CARDIOVERSION N/A 06/29/2019   Procedure: TRANSESOPHAGEAL ECHOCARDIOGRAM (TEE);  Surgeon: Acie Fredrickson Wonda Cheng, MD;  Location: [facility];  Service: Cardiovascular;  Laterality: N/A;  . US ECHOCARDIOGRAPHY  02/2009   WNL, EF >55% [facility])  . US RENAL/AORTA Right 05/2009   1-59% diameter reduction renal artery, rec rpt 2 yrs    There were no vitals filed for this visit.   Subjective Assessment - 09/01/19 0849    Subjective Had the nerve conduction study, and I do have neuropathy.  They said to keep working on my balance.    Pertinent  History L foot fracture, sepsis with septic shock, acute renal railure, OSA, GERD, anxiety, HTN, restrictive lung disease with scoliosis, neuropathy    Patient Stated Goals Pt's goal for therapy are for strengthening and endurance.    Currently in Pain? No/denies                                  Balance Exercises - 09/01/19 0001      Balance Exercises: Standing   Standing Eyes Opened Narrow base of support (BOS);Head turns;Foam/compliant surface;Limitations;Wide (BOA);5 reps   Head nods   Standing Eyes Opened Limitations Light UE support at counter    Standing Eyes Closed Wide (BOA);Narrow base of support (BOS);Foam/compliant surface;Head turns;5 reps   Head turns   Standing Eyes Closed Limitations min guard assistance and UE support at counter    Standing, One Foot on a Step Eyes open;Head turns;6 inch;5 reps;Limitations   Head nods; alt UE lifts x 10 reps   Standing, One Foot on a Step Limitations One foot on step with diagonal head motions x 5 reps each direction with UE support at rails, min guard    Stepping Strategy Anterior;Posterior    Stepping Strategy Limitations Standing on beam:  anterior/posterior step and weightshift x 10 reps 1 UE support    Balance Beam Standing on blue foam beam:  sidestep R<>L x 10 reps, side step and weightshift, then return to midline x 10 reps    Sidestepping Foam/compliant support;Upper extremity support;3 reps    Other Standing Exercises On red and blue mats:  forward walking, forward/back walking, forward walk with head turns, then sidestepping with min guard.  Sidestepping with step taps to cones, then knocking over/righting cones, with min guard/occasional min assist.    Other Standing Exercises Comments Standing on solid surface with wide BOS:  trunk rotation looking over shoulder x 5 reps, with increased fatigue/shakiness noted in legs with this activity.                  PT Long Term Goals - 08/28/19 1221      PT  LONG TERM GOAL #1   Title Pt will be independent with progression of HEP for improved strength, balance, transfers, and gait.  TARGET 10/02/2019    Time 5    Period Weeks    Status Revised      PT LONG TERM GOAL #2   Title Pt will improve FGA score to at least 25/30 for decreased fall risk with improved dynamic gait.    Baseline FGA 22/30    Time 5    Period Weeks    Status New      PT LONG TERM GOAL #3   Title Pt will improve 6 MWT score to at  least 1400 ft for improved gait efficiency and endurance.    Baseline 1246 ft    Time 5    Period Weeks    Status New      PT LONG TERM GOAL #4   Title Pt will negotiate at least 4 steps carrying object in both hands, modified independently, for improved stair negotiation in and out of home.    Time 5    Period Weeks    Status New      PT LONG TERM GOAL #5   Title Pt will report ambulating in his neighborhood at least 20 minutes using walking pole only, for improved gait independence.    Time 5    Period Weeks    Status New                 Plan - 09/01/19 1045    Clinical Impression Statement Focused on balance on compliant surfaces, with pt requiring slight UE support with head motions and min guard assist iwth dynamic balance on red/blue mat activities.  SLS activities at end of session and pt intermittently needs UE support.  He will continue to benefit from skilled PT to address balance and gait for improved overall functional mobility.    Personal Factors and Comorbidities Comorbidity 3+    Comorbidities history of familial tremors, restrictive lung disease with scoliosis, perforated duodenal ulcer, hepatic steatosis, moderate to excessive alcohol use, left Lisfranc fracture with dislocations s/p ORIF with onset of neuropathy, B12 deficiency, gait disorder with history of falls; R 5th metatarsal fx s/p fall 06/2019    Examination-Activity Limitations Locomotion Level;Transfers;Stand;Stairs    Examination-Participation  Restrictions Church;Community Activity;Other   Fly fishing, golf, pilates   Stability/Clinical Decision Making Evolving/Moderate complexity    Rehab Potential Good    PT Frequency 2x / week    PT Duration Other (comment)   5 weeks, per recert 04/09/8117   PT Treatment/Interventions ADLs/Self Care Home Management;Gait training;Stair training;Functional mobility training;Therapeutic activities;Therapeutic exercise;Balance training;Neuromuscular re-education;Patient/family education    PT Next Visit Plan Trunk rotation in corner; continue compliant surface work, SLS activities on compliant surfaces; stair negotiation with carrying items; dynamic gait activities on unlevel surfaces    Consulted and Agree with Plan of Care Patient           Patient will benefit from skilled therapeutic intervention in order to improve the following deficits and impairments:  Abnormal gait, Difficulty walking, Decreased endurance, Decreased activity tolerance, Decreased balance, Decreased mobility, Decreased strength, Postural dysfunction  Visit Diagnosis: Unsteadiness on feet     Problem List Patient Active Problem List   Diagnosis Date Noted  . Neuropathy 07/20/2019  . Hypomagnesemia   . Macrocytic anemia   . Bacteremia   . Debility 07/07/2019  . Acute respiratory failure with hypoxia ([place])   . Atelectasis   . Enterococcal bacteremia 06/26/2019  . AKI (acute kidney injury) ([place]) 06/25/2019  . Perforated viscus 01/06/2019  . Severe sepsis with septic shock ([place]) 01/06/2019  . Acute renal failure ([place]) 01/06/2019  . Hyperkalemia 01/06/2019  . Hyponatremia 01/06/2019  . Lactic acidosis 01/06/2019  . Onychomycosis 05/19/2018  . Foot fracture, left, closed, initial encounter 05/09/2018  . Lisfranc dislocation, left, initial encounter   . Acute bronchitis 02/25/2018  . Primary pulmonary hypertension ([place]) 07/09/2017  . Diastolic dysfunction 14/78/2956  . OSA (obstructive sleep apnea) 07/08/2017  .  SOB (shortness of breath) 07/02/2017  . Chronic respiratory failure with hypoxia and hypercapnia ([place]) 07/02/2017  . Cardiomegaly 07/02/2017  .  Exposure keratitis 02/17/2017  . Pedal edema 12/18/2016  . Transaminitis 12/18/2016  . Bilateral pes planus 12/18/2016  . Psoriasis-eczema overlap condition 07/30/2016  . High serum high density lipoprotein (HDL) 09/26/2015  . Epistaxis 09/26/2015  . Renal artery stenosis in 1 of 2 vessels ([place])   . Essential tremor   . Elevated PSA   . GERD (gastroesophageal reflux disease)   . Health maintenance examination 10/08/2014  . Anxiety state 10/08/2014  . Obesity, Class I, BMI 30-34.9   . Mild intermittent asthma in adult without complication   . Seasonal allergies   . HTN (hypertension)   . Scoliosis   . Osteoporosis     Rosanne Wohlfarth W. 09/01/2019, 10:47 AM Frazier Butt., PT  [facility] [address] [place], [place], [address] Phone: [phone]   Fax:  [phone]  Name: Lenix Kidd MRN: [id number] Date of Birth: [birth year]

## 2019-09-02 ENCOUNTER — Ambulatory Visit (INDEPENDENT_AMBULATORY_CARE_PROVIDER_SITE_OTHER): Payer: 59 | Admitting: Acute Care

## 2019-09-02 ENCOUNTER — Encounter: Payer: Self-pay | Admitting: Acute Care

## 2019-09-02 VITALS — BP 122/68 | HR 69 | Temp 97.1°F | Ht 71.5 in | Wt 205.2 lb

## 2019-09-02 DIAGNOSIS — G4733 Obstructive sleep apnea (adult) (pediatric): Secondary | ICD-10-CM | POA: Diagnosis not present

## 2019-09-02 DIAGNOSIS — R5381 Other malaise: Secondary | ICD-10-CM | POA: Diagnosis not present

## 2019-09-02 NOTE — Progress Notes (Signed)
History of Present Illness Louis [name] is a 64 y.o. male with PMH significant for HTN, CHF, COPD, sleep apnea on CPAP , and scoliosis with  recent hospitalization  06/25/2019-7/17/2021with sepsis 2/2 Enterococcus in his blood and urine. Additionally he required intubation 6/30-7/1 for hypercarbic respiratory failure/ HAP prior to admission to inpatient rehab 7/6-7/17. The patient was treated by Dr. Lake Bells and Dr. Ander Slade as an inpatient. He is followed by [name] for his OSA.     09/03/2019 Pt. Presents for follow up. He states he had initially wanted to change his DME company, but has subsequently changed his mind. He has been following his CPAP therapy on his phone and has noted frequent increase in AHI. Down Load reveals AHI of 11.3 ( 08/02/2019-09/01/2019) He is working on weight loss. He has lost 20 pounds. ( weight is now 201). He has pending abdominal surgery to repair his hernia. He wears a nasal pillow, and his down load reveals medium leaks per minute of 2.5, max of 33. He feels he does have worsening leaks when he is on his side sleeping. His current setting is set pressure of 11 cm H2O.  Last split night had recommended 9 cm set rate.  Patient wants to try auto set 5-15 cm H2O before doing another Split night study. He is using nasal pillows vs the recommended mask noted in the split night recommendations. I have agreed to a 1 month trial with him monitoring daily for any improvement in his AHI. If down Load at 1 month does not show improvement in AHI , he has agreed to a split night study.  He states he feels 99% recovered from his hospitalization. He is working on weight loss and will need to have an abdominal hernia repair. He denies any fever, or chest pain. He is participating in outpatient Physical therapy.   Test Results: NPSG AHI 39/h, >> CPAP 9 cm + 2L o2  Split Night Study 08/2017>> DIAGNOSIS  - Obstructive Sleep Apnea (327.23 [G47.33 ICD-10])  - Nocturnal Hypoxemia  (327.26 [G47.36 ICD-10])  RECOMMENDATIONS  - Trial of CPAP therapy on 9 cm H2O with a Medium size  [employer] Full Face Mask Simplus mask and heated  humidification.  - 2L O2 should be blended into CPAP   CPAP Down Load 08/02/2019-08/31/2019     07/01/2019 Chest X ray Interval placement of endotracheal tube approximately 4 cm above the carina. No interval change in the appearance of the lungs compared to earlier today. Lung bases excluded from chest radiograph, included on abdominal radiograph. Findings remain concerning for bilateral airspace disease with concomitant effusions RIGHT greater than LEFT. PICC line remains in place, tip not well assessed but grossly similar to the prior exam where was seen at the caval to atrial junction.  June 26, 2019 TTE: LVEF 55 to 34%, RV systolic function mildly reduced, aortic dilation, valves normal as visualized June 28 TEE: LVEF normal, RV systolic function normal, mitral and aortic valves normal 6/24 SARS COV 2 > negative 6/24 blood > enterococcus faecalis amp sensitive 6/24 urine culture  6/26 blood > negative  PFT's 08/15/2017>> suggestive extraparenchymal restriction         CBC Latest Ref Rng & Units 08/03/2019 07/13/2019 07/08/2019  WBC 4.6 - 10.2 K/uL 8.1 4.2 5.9  Hemoglobin 11 - 14.6 g/dL 10.4(A) 9.3(L) 9.5(L)  Hematocrit 29 - 41 % 32.4 29.3(L) 29.3(L)  Platelets 150 - 400 K/uL - 355 438(H)    BMP Latest Ref Rng &  Units 07/21/2019 07/13/2019 07/08/2019  Glucose 65 - 99 mg/dL 96 99 99  BUN 8 - 27 mg/dL 12 14 9   Creatinine 0.76 - 1.27 mg/dL 0.88 1.11 0.80  BUN/Creat Ratio 10 - 24 14 - -  Sodium 134 - 144 mmol/L 135 135 135  Potassium 3.5 - 5.2 mmol/L 4.4 4.0 3.9  Chloride 96 - 106 mmol/L 99 100 99  CO2 20 - 29 mmol/L 25 24 27   Calcium 8.6 - 10.2 mg/dL 9.2 9.3 9.4    BNP    Component Value Date/Time   BNP 229.1 (H) 06/25/2019 1217    ProBNP No results found for: PROBNP  PFT    Component Value Date/Time    FEV1PRE 1.87 08/15/2017 1002   FEV1POST 1.97 08/15/2017 1002   FVCPRE 2.90 08/15/2017 1002   FVCPOST 2.79 08/15/2017 1002   TLC 5.72 08/15/2017 1002   DLCOUNC 27.00 08/15/2017 1002   PREFEV1FVCRT 64 08/15/2017 1002   PSTFEV1FVCRT 71 08/15/2017 1002    NCV with EMG(electromyography)  Result Date: 08/31/2019 Louis Ducking, MD     08/31/2019  3:41 PM HISTORY: Louis [name] is a 64 year old gentleman with a history of numbness in the feet and gait instability, he is being evaluated for a possible neuropathy.  He has recently had a hospitalization with a severe metabolic disturbance requiring intensive care unit therapy, his symptoms in the legs worsened with this hospitalization. NERVE CONDUCTION STUDIES: Nerve conduction studies were performed on both lower extremities.  No response was seen for the right peroneal nerve.  The distal motor latencies for the left peroneal nerve and for the posterior tibial nerves bilaterally were normal with low motor amplitudes seen for these nerves.  Slowing was seen for the left peroneal nerve and for the posterior tibial nerves bilaterally.  No response was seen for the sural or peroneal sensory latencies bilaterally.  The F-wave latency for the right posterior tibial nerve was prolonged, unobtainable on the left. EMG STUDIES: EMG study was performed on the right lower extremity: The tibialis anterior muscle reveals 2 to 3K motor units with decreased recruitment. No fibrillations or positive waves were seen. The peroneus tertius muscle reveals 1 to 3K motor units with significantly decreased recruitment. No fibrillations or positive waves were seen. The medial gastrocnemius muscle reveals 1 to 3K motor units with decreased recruitment. No fibrillations or positive waves were seen. The vastus lateralis muscle reveals 2 to 4K motor units with full recruitment. No fibrillations or positive waves were seen. The iliopsoas muscle reveals 2 to 4K motor units with full  recruitment. No fibrillations or positive waves were seen. The biceps femoris muscle (long head) reveals 2 to 4K motor units with full recruitment. No fibrillations or positive waves were seen. The lumbosacral paraspinal muscles were tested at 3 levels, and revealed no abnormalities of insertional activity at all 3 levels tested. There was good relaxation. IMPRESSION: Nerve conduction studies done on both lower extremities showed evidence of a primarily axonal peripheral neuropathy of moderate to severe severity.  The EMG evaluation of the right lower extremity shows chronic neuropathic signs of denervation distally consistent with a diagnosis of peripheral neuropathy.  There is no clear evidence of an overlying lumbosacral radiculopathy. Jill Alexanders MD 08/31/2019 3:37 PM [facility] [address] [place], [place] [address] Phone [phone] Fax [phone]     Past medical hx Past Medical History:  Diagnosis Date  . Anxiety    situational  . BPH (benign prostatic hyperplasia)  on flomax  . Bundle branch block    per prior PCP records  . CHF (congestive heart failure) ([place])   . COPD (chronic obstructive pulmonary disease) ([place])   . Diverticulosis    by colonoscopy  . Eczema    per prior PCP records  . Elevated PSA 2015   peaked 6s, s/p benign biopsy 2014, sees urology [name] ([place])  . Essential tremor   . GERD (gastroesophageal reflux disease)    per prior PCP records  . History of panic attacks    per prior PCP records  . HTN (hypertension)   . Mild intermittent asthma in adult without complication   . Obesity, Class I, BMI 30-34.9   . Osteoporosis    DEXA 06/2013 with osteopenia - h/o ?wrist/hip fracture from AVN from chronic steroid use (asthma), took reclast for 1 year  . Renal artery stenosis in 1 of 2 vessels ([place]) 2011   by [place]  . Seasonal allergies   . Sleep apnea   . Thoracic scoliosis childhood  . Transaminitis    per prior  PCP records     Social History   Tobacco Use  . Smoking status: Never Smoker  . Smokeless tobacco: Never Used  Vaping Use  . Vaping Use: Never used  Substance Use Topics  . Alcohol use: Not Currently    Alcohol/week: 14.0 standard drinks    Types: 14 Glasses of wine per week    Comment: Social  . Drug use: No    Mr.Breidenbach reports that he has never smoked. He has never used smokeless tobacco. He reports previous alcohol use of about 14.0 standard drinks of alcohol per week. He reports that he does not use drugs.  Tobacco Cessation: Never smoker  Past surgical hx, Family hx, Social hx all reviewed.  Current Outpatient Medications on File Prior to Visit  Medication Sig  . ALPRAZolam (XANAX) 0.5 MG tablet Take 1 tablet (0.5 mg total) by mouth daily as needed (essential tremor).  . Amino Acids-Protein Hydrolys (FEEDING SUPPLEMENT, PRO-STAT SUGAR FREE 64,) LIQD Take 30 mLs by mouth 3 (three) times daily with meals.  [name] amLODipine (NORVASC) 5 MG tablet TAKE 1 TABLET(5 MG) BY MOUTH DAILY (Patient taking differently: Take 5 mg by mouth daily. )  . cyanocobalamin 1000 MCG tablet Take 1 tablet (1,000 mcg total) by mouth daily. Available over the counter  . docusate sodium (COLACE) 100 MG capsule Take 1 capsule (100 mg total) by mouth 2 (two) times daily as needed for mild constipation. Available over the counter  . folic acid (FOLVITE) 1 MG tablet Take 1 tablet (1 mg total) by mouth daily.  [name] gabapentin (NEURONTIN) 300 MG capsule Take 1 capsule (300 mg total) by mouth 2 (two) times daily.  [name] losartan (COZAAR) 100 MG tablet Take 1 tablet (100 mg total) by mouth daily.  . magnesium oxide (MAG-OX) 400 (241.3 Mg) MG tablet Take 1 tablet (400 mg total) by mouth with breakfast, with lunch, and with evening meal.  . metoprolol succinate (TOPROL-XL) 50 MG 24 hr tablet Take 1 tablet (50 mg total) by mouth daily. Take with a meal.  . Multiple Vitamin (MULTIVITAMIN WITH MINERALS) TABS tablet Take 1  tablet by mouth daily.  [name] omeprazole (PRILOSEC) 20 MG capsule Take 20 mg by mouth daily.  [name] thiamine 100 MG tablet Take 1 tablet (100 mg total) by mouth daily. Available over the counter   No current facility-administered medications on file prior to visit.  Allergies  Allergen Reactions  . Accupril [Quinapril Hcl] Cough  . Nsaids     Perforated gastric ulcer    Review Of Systems:  Constitutional:   No  weight loss, night sweats,  Fevers, chills, fatigue, or  lassitude.  HEENT:   No headaches,  Difficulty swallowing,  Tooth/dental problems, or  Sore throat,                No sneezing, itching, ear ache, nasal congestion, post nasal drip,   CV:  No chest pain,  Orthopnea, PND, swelling in lower extremities, anasarca, dizziness, palpitations, syncope.   GI  No heartburn, indigestion, abdominal pain, nausea, vomiting, diarrhea, change in bowel habits, loss of appetite, bloody stools.   Resp: No shortness of breath with exertion or at rest.  No excess mucus, no productive cough,  No non-productive cough,  No coughing up of blood.  No change in color of mucus.  No wheezing.  No chest wall deformity  Skin: no rash or lesions.  GU: no dysuria, change in color of urine, no urgency or frequency.  No flank pain, no hematuria   MS:  No joint pain or swelling.  No decreased range of motion.  No back pain.  Psych:  No change in mood or affect. No depression or anxiety.  No memory loss.   Vital Signs BP 122/68 (BP Location: Left Arm, Cuff Size: Normal)   Pulse 69   Temp (!) 97.1 F (36.2 C) (Oral)   Ht 5' 11.5" (1.816 m)   Wt 205 lb 3.2 oz (93.1 kg)   SpO2 98%   BMI 28.22 kg/m    Physical Exam:  General- No distress,  A&Ox3, pleasant ENT: No sinus tenderness, TM clear, pale nasal mucosa, no oral exudate,no post nasal drip, no LAN Cardiac: S1, S2, regular rate and rhythm, no murmur Chest: No wheeze/ rales/ dullness; no accessory muscle use, no nasal flaring, no sternal  retractions Abd.: Soft Non-tender, ND, BS +,large  abdominal hernia Ext: No clubbing cyanosis, No edema, No obvious deformities Neuro:  normal strength, MAE x 4, A&O x 3 Skin: No rashes, No lesions, warm and dry Psych: normal mood and behavior   Assessment/Plan OSA on CPAP set pressure 11 cm  AHI per down Load is 11.3 Plan We will change your settings to Auto set 5-15  cm H2O If this does not work, we will do a split night study  Follow up in 1 month with down load. If no significant improvement we will schedule the split night study. Keep working on weight loss, as you have been doing.  Continue on CPAP at bedtime, with oxygen at 2 L bled in. You appear to be benefiting from the treatment  Goal is to wear for at least 6 hours each night for maximal clinical benefit. Continue to work on weight loss, as the link between excess weight  and sleep apnea is well established.   Remember to establish a good bedtime routine, and work on sleep hygiene.  Limit daytime naps , avoid stimulants such as caffeine and nicotine close to bedtime, exercise daily to promote sleep quality, avoid heavy , spicy, fried , or rich foods before bed. Ensure adequate exposure to natural light during the day,establish a relaxing bedtime routine with a pleasant sleep environment ( Bedroom between 60 and 67 degrees, turn off bright lights , TV or device screens screens , consider black out curtains or white noise machines) Do not drive if sleepy. Remember to clean  mask, tubing, filter, and reservoir once weekly with soapy water.  Follow up with Justen Fonda NP   In  1 month  or before as needed.   Please contact office for sooner follow up if symptoms do not improve or worsen or seek emergency care.      Resolved Healthcare associated pneumonia Resolved Acute hypoxemic hypercapnic respiratory failure Intubated 6/31/2021- 07/02/2019 History of COPD, scoliosis>> Not on maintenance therapy Plan Call for any shortness of  breath, fever, change in secretions PFT's for any change in respiratory status to ensure maintenance therapy is not indicated     Enterococcal bacteremia and UTI>> Resolved Remains physically deconditioned >> OP Physical Therapy Plan OP PT Follow up with ID as scheduled Call ID or PCP for new fever, weakness of confusion  This appointment was 30 min long with over 50% of the time in direct face-to-face patient care, assessment, plan of care, and follow-up.    Magdalen Spatz, NP 09/03/2019  9:35 AM

## 2019-09-02 NOTE — Patient Instructions (Addendum)
It is good to see you today.  We will change your settings to Auto set 5-15  cm H2O If this does not work, we will do a split night study  Follow up in 1 month with down load. If no significant improvement we will schedule the split night study. Keep working on weight loss, as you have been doing.  Continue on CPAP at bedtime. You appear to be benefiting from the treatment  Goal is to wear for at least 6 hours each night for maximal clinical benefit. Continue to work on weight loss, as the link between excess weight  and sleep apnea is well established.   Remember to establish a good bedtime routine, and work on sleep hygiene.  Limit daytime naps , avoid stimulants such as caffeine and nicotine close to bedtime, exercise daily to promote sleep quality, avoid heavy , spicy, fried , or rich foods before bed. Ensure adequate exposure to natural light during the day,establish a relaxing bedtime routine with a pleasant sleep environment ( Bedroom between 60 and 67 degrees, turn off bright lights , TV or device screens screens , consider black out curtains or white noise machines) Do not drive if sleepy. Remember to clean mask, tubing, filter, and reservoir once weekly with soapy water.  Follow up with Louis Monts NP   In  1 month  or before as needed.   Please contact office for sooner follow up if symptoms do not improve or worsen or seek emergency care.

## 2019-09-03 ENCOUNTER — Ambulatory Visit: Payer: 59 | Admitting: Physical Therapy

## 2019-09-03 ENCOUNTER — Encounter: Payer: Self-pay | Admitting: Acute Care

## 2019-09-04 ENCOUNTER — Other Ambulatory Visit: Payer: Self-pay

## 2019-09-04 ENCOUNTER — Ambulatory Visit: Payer: 59 | Attending: Physician Assistant | Admitting: Physical Therapy

## 2019-09-04 ENCOUNTER — Encounter: Payer: Self-pay | Admitting: Physical Therapy

## 2019-09-04 DIAGNOSIS — R2681 Unsteadiness on feet: Secondary | ICD-10-CM | POA: Insufficient documentation

## 2019-09-04 DIAGNOSIS — M6281 Muscle weakness (generalized): Secondary | ICD-10-CM | POA: Insufficient documentation

## 2019-09-04 DIAGNOSIS — R2689 Other abnormalities of gait and mobility: Secondary | ICD-10-CM | POA: Diagnosis present

## 2019-09-04 NOTE — Therapy (Signed)
[facility] [address] [place], [place], [address] Phone: [phone]   Fax:  [phone]  Physical Therapy Treatment  Patient Details  Name: Louis [name] MRN: [id number] Date of Birth: 05-16-1955 Referring Provider (PT): Marlowe Shores, PA-C Grant Fontana, MD)   Encounter Date: 09/04/2019   PT End of Session - 09/04/19 0852    Visit Number 12    Number of Visits 20   per recert 07/29/209   Authorization Type [employer], covered 100% as ded and OOPM met; VL:  30    PT Start Time 0848    PT Stop Time 0930    PT Time Calculation (min) 42 min    Equipment Utilized During Treatment Gait belt    Activity Tolerance Patient tolerated treatment well    Behavior During Therapy WFL for tasks assessed/performed           Past Medical History:  Diagnosis Date  . Anxiety    situational  . BPH (benign prostatic hyperplasia)    on flomax  . Bundle branch block    per prior PCP records  . CHF (congestive heart failure) ([facility])   . COPD (chronic obstructive pulmonary disease) ([place])   . Diverticulosis    by colonoscopy  . Eczema    per prior PCP records  . Elevated PSA 2015   peaked 6s, s/p benign biopsy 2014, sees urology [name] ([place])  . Essential tremor   . GERD (gastroesophageal reflux disease)    per prior PCP records  . History of panic attacks    per prior PCP records  . HTN (hypertension)   . Mild intermittent asthma in adult without complication   . Obesity, Class I, BMI 30-34.9   . Osteoporosis    DEXA 06/2013 with osteopenia - h/o ?wrist/hip fracture from AVN from chronic steroid use (asthma), took reclast for 1 year  . Renal artery stenosis in 1 of 2 vessels ([place]) 2011   by [place]  . Seasonal allergies   . Sleep apnea   . Thoracic scoliosis childhood  . Transaminitis    per prior PCP records    Past Surgical History:  Procedure Laterality Date  . APPLICATION OF WOUND VAC Left 05/09/2018    Procedure: Application Of Wound Vac;  Surgeon: Newt Minion, MD;  Location: [facility];  Service: Orthopedics;  Laterality: Left;  . BIOPSY  04/30/2019   Procedure: BIOPSY;  Surgeon: Juanita Craver, MD;  [facility];  Service: Endoscopy;;  . COLONOSCOPY  12/2013   polyps, int hem, diverticulosis, rpt 5 yrs ([place])  . COLONOSCOPY WITH PROPOFOL N/A 04/30/2019   Procedure: COLONOSCOPY WITH PROPOFOL;  Surgeon: Juanita Craver, MD;  [facility];  Service: Endoscopy;  Laterality: N/A;  . ELBOW SURGERY Right 2008   golfer's elbow  . ESOPHAGOGASTRODUODENOSCOPY (EGD) WITH PROPOFOL N/A 04/30/2019   Procedure: ESOPHAGOGASTRODUODENOSCOPY (EGD) WITH PROPOFOL;  Surgeon: Juanita Craver, MD;  [facility];  Service: Endoscopy;  Laterality: N/A;  . INGUINAL HERNIA REPAIR Bilateral childhood & ~1995  . LAPAROTOMY N/A 01/05/2019   Procedure: EXPLORATORY LAPAROTOMY graham patch repair;  Surgeon: Benjamine Sprague, DO;  Location: [facility];  Service: General;  Laterality: N/A;  . NASAL SEPTUM SURGERY  2013   deviated septum  . ORIF ANKLE FRACTURE Left 05/09/2018   Procedure: OPEN REDUCTION INTERNAL FIXATION LEFT LISFRANC FRACTURE/DISLOCATION;  Surgeon: Newt Minion, MD;  Location: [facility];  Service: Orthopedics;  Laterality: Left;  . POLYPECTOMY  04/30/2019   Procedure:  POLYPECTOMY;  Surgeon: Juanita Craver, MD;  [facility];  Service: Endoscopy;;  . TEE WITHOUT CARDIOVERSION N/A 06/29/2019   Procedure: TRANSESOPHAGEAL ECHOCARDIOGRAM (TEE);  Surgeon: Acie Fredrickson Wonda Cheng, MD;  Location: [facility];  Service: Cardiovascular;  Laterality: N/A;  . US ECHOCARDIOGRAPHY  02/2009   WNL, EF >55% [facility])  . US RENAL/AORTA Right 05/2009   1-59% diameter reduction renal artery, rec rpt 2 yrs    There were no vitals filed for this visit.   Subjective Assessment - 09/04/19 0851    Subjective No new complaints. No falls or pain to report.    Pertinent History L foot fracture, sepsis with septic shock, acute  renal railure, OSA, GERD, anxiety, HTN, restrictive lung disease with scoliosis, neuropathy    Patient Stated Goals Pt's goal for therapy are for strengthening and endurance.    Currently in Pain? No/denies    Pain Score 0-No pain                  [facility] Adult PT Treatment/Exercise - 09/04/19 0852      Transfers   Transfers Sit to Stand;Stand to Sit    Sit to Stand 5: Supervision;Without upper extremity assist;From chair/3-in-1    Stand to Sit 5: Supervision;Without upper extremity assist;To chair/3-in-1      Ambulation/Gait   Ambulation/Gait Yes    Ambulation/Gait Assistance 5: Supervision    Ambulation/Gait Assistance Details around gym with session.     Assistive device None    Gait Pattern Step-through pattern;Wide base of support    Ambulation Surface Level;Indoor      Neuro Re-ed    Neuro Re-ed Details  for balance/muscle re-ed: in corner on airex cross body reacing to opposite wall for 10 reps up high, 10 reps middle and 10 reps low on each side. cues for weight shifting and to slow down. min guard assist with the low taps being most unstable                Balance Exercises - 09/04/19 0001      Balance Exercises: Standing   Wall Bumps Hip;Eyes opened;Eyes closed;10 reps;Limitations    Wall Bumps Limitations on airex: x 10 with EO, then 10 with EC. cues form/technique with min guard assist     Rockerboard Anterior/posterior;Lateral;30 seconds;EO;EC;Limitations;10 reps;Head turns    Rockerboard Limitations performed both ways on balance board: rocking the board with emphasis on tall posture with EO, progressing to EC. then holding the board steady for EC for 30 sec's x 3 reps. min guard to min assist with occaisional touch to walls/chair for balance assistance.     Balance Beam standing across blue foam beam: alternating fwd heel taps, then bwd toe taps for 10 reps each with no UE support to light touch on bars for balance. min guard assist with cues for increased  step length/step height.     Tandem Gait Forward;Retro;Intermittent upper extremity support;3 reps;Limitations    Tandem Gait Limitations on blue foam beam- fwd/bwd tandem steps with heel taps to floor before placing foot on beam, light UE support on bars.     Sidestepping Foam/compliant support;Upper extremity support;3 reps    Sidestepping Limitations on blue foam beam for 4 laps each way with cues on step length and posture. intermittent touch to bars for balance assistance.                   PT Long Term Goals - 08/28/19 1221      PT LONG  TERM GOAL #1   Title Pt will be independent with progression of HEP for improved strength, balance, transfers, and gait.  TARGET 10/02/2019    Time 5    Period Weeks    Status Revised      PT LONG TERM GOAL #2   Title Pt will improve FGA score to at least 25/30 for decreased fall risk with improved dynamic gait.    Baseline FGA 22/30    Time 5    Period Weeks    Status New      PT LONG TERM GOAL #3   Title Pt will improve 6 MWT score to at least 1400 ft for improved gait efficiency and endurance.    Baseline 1246 ft    Time 5    Period Weeks    Status New      PT LONG TERM GOAL #4   Title Pt will negotiate at least 4 steps carrying object in both hands, modified independently, for improved stair negotiation in and out of home.    Time 5    Period Weeks    Status New      PT LONG TERM GOAL #5   Title Pt will report ambulating in his neighborhood at least 20 minutes using walking pole only, for improved gait independence.    Time 5    Period Weeks    Status New                 Plan - 09/04/19 5697    Clinical Impression Statement Today's skilled session continued to focus on balance reactions with 2 rest breaks needed. Up to min assist for balance, increased with vision removed. The pt is progressing toward goals and should benefit from continued PT to progress toward unmet goals.    Personal Factors and Comorbidities  Comorbidity 3+    Comorbidities history of familial tremors, restrictive lung disease with scoliosis, perforated duodenal ulcer, hepatic steatosis, moderate to excessive alcohol use, left Lisfranc fracture with dislocations s/p ORIF with onset of neuropathy, B12 deficiency, gait disorder with history of falls; R 5th metatarsal fx s/p fall 06/2019    Examination-Activity Limitations Locomotion Level;Transfers;Stand;Stairs    Examination-Participation Restrictions Church;Community Activity;Other   Fly fishing, golf, pilates   Stability/Clinical Decision Making Evolving/Moderate complexity    Rehab Potential Good    PT Frequency 2x / week    PT Duration Other (comment)   5 weeks, per recert 9/48/0165   PT Treatment/Interventions ADLs/Self Care Home Management;Gait training;Stair training;Functional mobility training;Therapeutic activities;Therapeutic exercise;Balance training;Neuromuscular re-education;Patient/family education    PT Next Visit Plan Trunk rotation in corner; continue compliant surface work, SLS activities on compliant surfaces; stair negotiation with carrying items; dynamic gait activities on unlevel surfaces    Consulted and Agree with Plan of Care Patient           Patient will benefit from skilled therapeutic intervention in order to improve the following deficits and impairments:  Abnormal gait, Difficulty walking, Decreased endurance, Decreased activity tolerance, Decreased balance, Decreased mobility, Decreased strength, Postural dysfunction  Visit Diagnosis: Unsteadiness on feet  Other abnormalities of gait and mobility  Muscle weakness (generalized)     Problem List Patient Active Problem List   Diagnosis Date Noted  . Neuropathy 07/20/2019  . Hypomagnesemia   . Macrocytic anemia   . Bacteremia   . Debility 07/07/2019  . Acute respiratory failure with hypoxia ([place])   . Atelectasis   . Enterococcal bacteremia 06/26/2019  . AKI (acute kidney injury) (  [place])  06/25/2019  . Perforated viscus 01/06/2019  . Severe sepsis with septic shock ([place]) 01/06/2019  . Acute renal failure ([place]) 01/06/2019  . Hyperkalemia 01/06/2019  . Hyponatremia 01/06/2019  . Lactic acidosis 01/06/2019  . Onychomycosis 05/19/2018  . Foot fracture, left, closed, initial encounter 05/09/2018  . Lisfranc dislocation, left, initial encounter   . Acute bronchitis 02/25/2018  . Primary pulmonary hypertension ([place]) 07/09/2017  . Diastolic dysfunction 00/76/2263  . OSA (obstructive sleep apnea) 07/08/2017  . SOB (shortness of breath) 07/02/2017  . Chronic respiratory failure with hypoxia and hypercapnia ([place]) 07/02/2017  . Cardiomegaly 07/02/2017  . Exposure keratitis 02/17/2017  . Pedal edema 12/18/2016  . Transaminitis 12/18/2016  . Bilateral pes planus 12/18/2016  . Psoriasis-eczema overlap condition 07/30/2016  . High serum high density lipoprotein (HDL) 09/26/2015  . Epistaxis 09/26/2015  . Renal artery stenosis in 1 of 2 vessels ([place])   . Essential tremor   . Elevated PSA   . GERD (gastroesophageal reflux disease)   . Health maintenance examination 10/08/2014  . Anxiety state 10/08/2014  . Obesity, Class I, BMI 30-34.9   . Mild intermittent asthma in adult without complication   . Seasonal allergies   . HTN (hypertension)   . Scoliosis   . Osteoporosis     Willow Ora, PTA, [facility] [address] [place], [place] [address] [phone] 09/04/19, 3:27 PM   Name: Louis [name] MRN: [id number] Date of Birth: 01-11-1955

## 2019-09-08 ENCOUNTER — Encounter: Payer: Self-pay | Admitting: Physical Therapy

## 2019-09-08 ENCOUNTER — Ambulatory Visit: Payer: 59 | Admitting: Physical Therapy

## 2019-09-08 ENCOUNTER — Other Ambulatory Visit: Payer: Self-pay

## 2019-09-08 DIAGNOSIS — R2689 Other abnormalities of gait and mobility: Secondary | ICD-10-CM

## 2019-09-08 DIAGNOSIS — R2681 Unsteadiness on feet: Secondary | ICD-10-CM | POA: Diagnosis not present

## 2019-09-08 DIAGNOSIS — M6281 Muscle weakness (generalized): Secondary | ICD-10-CM

## 2019-09-08 NOTE — Therapy (Signed)
[facility] [address] [place], [place], [address] Phone: [phone]   Fax:  [phone]  Physical Therapy Treatment  Patient Details  Name: Louis [name] MRN: [id number] Date of Birth: 07-16-1955 Referring Provider (PT): Marlowe Shores, PA-C Grant Fontana, MD)   Encounter Date: 09/08/2019   PT End of Session - 09/08/19 0721    Visit Number 13    Number of Visits 20   per recert 0/80/2233   Authorization Type [employer], covered 100% as ded and OOPM met; VL:  30    PT Start Time 0718    PT Stop Time 0758    PT Time Calculation (min) 40 min    Equipment Utilized During Treatment Gait belt    Activity Tolerance Patient tolerated treatment well    Behavior During Therapy [facility] for tasks assessed/performed           Past Medical History:  Diagnosis Date  . Anxiety    situational  . BPH (benign prostatic hyperplasia)    on flomax  . Bundle branch block    per prior PCP records  . CHF (congestive heart failure) ([place])   . COPD (chronic obstructive pulmonary disease) ([place])   . Diverticulosis    by colonoscopy  . Eczema    per prior PCP records  . Elevated PSA 2015   peaked 6s, s/p benign biopsy 2014, sees urology [name] ([place])  . Essential tremor   . GERD (gastroesophageal reflux disease)    per prior PCP records  . History of panic attacks    per prior PCP records  . HTN (hypertension)   . Mild intermittent asthma in adult without complication   . Obesity, Class I, BMI 30-34.9   . Osteoporosis    DEXA 06/2013 with osteopenia - h/o ?wrist/hip fracture from AVN from chronic steroid use (asthma), took reclast for 1 year  . Renal artery stenosis in 1 of 2 vessels ([place]) 2011   by [place]  . Seasonal allergies   . Sleep apnea   . Thoracic scoliosis childhood  . Transaminitis    per prior PCP records    Past Surgical History:  Procedure Laterality Date  . APPLICATION OF WOUND VAC Left 05/09/2018    Procedure: Application Of Wound Vac;  Surgeon: Newt Minion, MD;  Location: [facility];  Service: Orthopedics;  Laterality: Left;  . BIOPSY  04/30/2019   Procedure: BIOPSY;  Surgeon: Juanita Craver, MD;  [facility];  Service: Endoscopy;;  . COLONOSCOPY  12/2013   polyps, int hem, diverticulosis, rpt 5 yrs ([place])  . COLONOSCOPY WITH PROPOFOL N/A 04/30/2019   Procedure: COLONOSCOPY WITH PROPOFOL;  Surgeon: Juanita Craver, MD;  [facility];  Service: Endoscopy;  Laterality: N/A;  . ELBOW SURGERY Right 2008   golfer's elbow  . ESOPHAGOGASTRODUODENOSCOPY (EGD) WITH PROPOFOL N/A 04/30/2019   Procedure: ESOPHAGOGASTRODUODENOSCOPY (EGD) WITH PROPOFOL;  Surgeon: Juanita Craver, MD;  [facility];  Service: Endoscopy;  Laterality: N/A;  . INGUINAL HERNIA REPAIR Bilateral childhood & ~1995  . LAPAROTOMY N/A 01/05/2019   Procedure: EXPLORATORY LAPAROTOMY graham patch repair;  Surgeon: Benjamine Sprague, DO;  Location: [facility];  Service: General;  Laterality: N/A;  . NASAL SEPTUM SURGERY  2013   deviated septum  . ORIF ANKLE FRACTURE Left 05/09/2018   Procedure: OPEN REDUCTION INTERNAL FIXATION LEFT LISFRANC FRACTURE/DISLOCATION;  Surgeon: Newt Minion, MD;  Location: [facility];  Service: Orthopedics;  Laterality: Left;  . POLYPECTOMY  04/30/2019   Procedure:  POLYPECTOMY;  Surgeon: [place], Jyothi, MD;  [facility];  Service: Endoscopy;;  . TEE WITHOUT CARDIOVERSION N/A 06/29/2019   Procedure: TRANSESOPHAGEAL ECHOCARDIOGRAM (TEE);  Surgeon: Nahser, Philip J, MD;  [facility];  Service: Cardiovascular;  Laterality: N/A;  . US ECHOCARDIOGRAPHY  02/2009   WNL, EF >55% ([place])  . US RENAL/AORTA Right 05/2009   1-59% diameter reduction renal artery, rec rpt 2 yrs    There were no vitals filed for this visit.   Subjective Assessment - 09/08/19 0721    Subjective No new complaint. No falls or pain to report.    Pertinent History L foot fracture, sepsis with septic shock, acute  renal railure, OSA, GERD, anxiety, HTN, restrictive lung disease with scoliosis, neuropathy    Patient Stated Goals Pt's goal for therapy are for strengthening and endurance.    Currently in Pain? No/denies    Pain Score 0-No pain              [facility] Adult PT Treatment/Exercise - 09/08/19 0722      Transfers   Transfers Sit to Stand;Stand to Sit    Sit to Stand 5: Supervision;Without upper extremity assist;From chair/3-in-1    Stand to Sit 5: Supervision;Without upper extremity assist;To chair/3-in-1      Ambulation/Gait   Ambulation/Gait Yes    Ambulation/Gait Assistance 5: Supervision    Ambulation/Gait Assistance Details no balance issues noted. mild dyspnea afterwards, 2/4.     Ambulation Distance (Feet) 1000 Feet   x1   Assistive device None    Gait Pattern Step-through pattern;Wide base of support    Ambulation Surface Level;Indoor;Unlevel;Outdoor;Paved      High Level Balance   High Level Balance Activities Side stepping;Marching forwards;Marching backwards;Tandem walking   tandem gait fwd/bwd   High Level Balance Comments on red/blue mats next to counter top: 3 laps each/each way with min guard assist, light touch on counter as needed for balance.                Balance Exercises - 09/08/19 0747      Balance Exercises: Standing   Standing Eyes Closed Narrow base of support (BOS);Wide (BOA);Head turns;Foam/compliant surface;Other reps (comment);30 secs;Limitations    Standing Eyes Closed Limitations on airex in corner with chair in front for safety:  feet together for EC no head movements, progressing to feet apart for EC head movements left<>right, then up<>down. min guard to min assist for balance with cues on posture and weight shifting to assist with balance.     SLS with Vectors Foam/compliant surface;Intermittent upper extremity assist;Limitations    SLS with Vectors Limitations 6 cones along edge of red/blue mats- alternating foot taps to each with side  stepping for one lap each way, then alternating double foot taps to each cone with side stepping x 1 lap each way. up to min assist for balance with cues on stance position and weight shifting.     Partial Tandem Stance Eyes closed;Foam/compliant surface;3 reps;20 secs;Limitations    Partial Tandem Stance Limitations on airex in corner with chair in front for safety- 3 reps with each foot forward with up to min assist for balance.                   PT Long Term Goals - 08/28/19 1221      PT LONG TERM GOAL #1   Title Pt will be independent with progression of HEP for improved strength, balance, transfers, and gait.  TARGET 10/02/2019      Time 5    Period Weeks    Status Revised      PT LONG TERM GOAL #2   Title Pt will improve FGA score to at least 25/30 for decreased fall risk with improved dynamic gait.    Baseline FGA 22/30    Time 5    Period Weeks    Status New      PT LONG TERM GOAL #3   Title Pt will improve 6 MWT score to at least 1400 ft for improved gait efficiency and endurance.    Baseline 1246 ft    Time 5    Period Weeks    Status New      PT LONG TERM GOAL #4   Title Pt will negotiate at least 4 steps carrying object in both hands, modified independently, for improved stair negotiation in and out of home.    Time 5    Period Weeks    Status New      PT LONG TERM GOAL #5   Title Pt will report ambulating in his neighborhood at least 20 minutes using walking pole only, for improved gait independence.    Time 5    Period Weeks    Status New                 Plan - 09/08/19 0721    Clinical Impression Statement Today's skilled session continued to focus on gait on various surfaces and on balance reactions. Pt continues to be challenged by compliant surfaces, single leg stace and with vision removed. Gait outdoors was improved today with no issues noted. The pt is progressing toward goals and should benefit from continued PT to progress toward unmet  goals.    Personal Factors and Comorbidities Comorbidity 3+    Comorbidities history of familial tremors, restrictive lung disease with scoliosis, perforated duodenal ulcer, hepatic steatosis, moderate to excessive alcohol use, left Lisfranc fracture with dislocations s/p ORIF with onset of neuropathy, B12 deficiency, gait disorder with history of falls; R 5th metatarsal fx s/p fall 06/2019    Examination-Activity Limitations Locomotion Level;Transfers;Stand;Stairs    Examination-Participation Restrictions Church;Community Activity;Other   Fly fishing, golf, pilates   Stability/Clinical Decision Making Evolving/Moderate complexity    Rehab Potential Good    PT Frequency 2x / week    PT Duration Other (comment)   5 weeks, per recert 08/28/2019   PT Treatment/Interventions ADLs/Self Care Home Management;Gait training;Stair training;Functional mobility training;Therapeutic activities;Therapeutic exercise;Balance training;Neuromuscular re-education;Patient/family education    PT Next Visit Plan continue compliant surface work, SLS activities on compliant surfaces; stair negotiation with carrying items; dynamic gait activities on unlevel surfaces    Consulted and Agree with Plan of Care Patient           Patient will benefit from skilled therapeutic intervention in order to improve the following deficits and impairments:  Abnormal gait, Difficulty walking, Decreased endurance, Decreased activity tolerance, Decreased balance, Decreased mobility, Decreased strength, Postural dysfunction  Visit Diagnosis: Unsteadiness on feet  Other abnormalities of gait and mobility  Muscle weakness (generalized)     Problem List Patient Active Problem List   Diagnosis Date Noted  . Neuropathy 07/20/2019  . Hypomagnesemia   . Macrocytic anemia   . Bacteremia   . Debility 07/07/2019  . Acute respiratory failure with hypoxia ([facility])   . Atelectasis   . Enterococcal bacteremia 06/26/2019  . AKI (acute  kidney injury) ([facility]) 06/25/2019  . Perforated viscus 01/06/2019  . Severe sepsis with septic shock ([facility])   01/06/2019  . Acute renal failure ([place]) 01/06/2019  . Hyperkalemia 01/06/2019  . Hyponatremia 01/06/2019  . Lactic acidosis 01/06/2019  . Onychomycosis 05/19/2018  . Foot fracture, left, closed, initial encounter 05/09/2018  . Lisfranc dislocation, left, initial encounter   . Acute bronchitis 02/25/2018  . Primary pulmonary hypertension ([place]) 07/09/2017  . Diastolic dysfunction 72/62/0355  . OSA (obstructive sleep apnea) 07/08/2017  . SOB (shortness of breath) 07/02/2017  . Chronic respiratory failure with hypoxia and hypercapnia ([place]) 07/02/2017  . Cardiomegaly 07/02/2017  . Exposure keratitis 02/17/2017  . Pedal edema 12/18/2016  . Transaminitis 12/18/2016  . Bilateral pes planus 12/18/2016  . Psoriasis-eczema overlap condition 07/30/2016  . High serum high density lipoprotein (HDL) 09/26/2015  . Epistaxis 09/26/2015  . Renal artery stenosis in 1 of 2 vessels ([place])   . Essential tremor   . Elevated PSA   . GERD (gastroesophageal reflux disease)   . Health maintenance examination 10/08/2014  . Anxiety state 10/08/2014  . Obesity, Class I, BMI 30-34.9   . Mild intermittent asthma in adult without complication   . Seasonal allergies   . HTN (hypertension)   . Scoliosis   . Osteoporosis     Willow Ora, PTA, [facility] [address], [place], [place] [address] [phone] 09/08/19, 10:58 AM   Name: Louis [name] MRN: [id number] Date of Birth: 11/09/1955

## 2019-09-10 ENCOUNTER — Other Ambulatory Visit: Payer: Self-pay

## 2019-09-10 ENCOUNTER — Ambulatory Visit
Admission: RE | Admit: 2019-09-10 | Discharge: 2019-09-10 | Disposition: A | Discharge status: Home / Self Care | Payer: 59 | Source: Ambulatory Visit | Attending: Surgery | Admitting: Surgery

## 2019-09-10 DIAGNOSIS — K432 Incisional hernia without obstruction or gangrene: Secondary | ICD-10-CM

## 2019-09-11 ENCOUNTER — Ambulatory Visit: Payer: 59 | Admitting: Physical Therapy

## 2019-09-11 DIAGNOSIS — R2681 Unsteadiness on feet: Secondary | ICD-10-CM

## 2019-09-11 NOTE — Therapy (Signed)
[facility] [address] [place], [place], [address] Phone: [phone]   Fax:  [phone]  Physical Therapy Treatment  Patient Details  Name: Louis [name] MRN: [id number] Date of Birth: 05-12-1955 Referring Provider (PT): Marlowe Shores, PA-C Grant Fontana, MD)   Encounter Date: 09/11/2019   PT End of Session - 09/11/19 1426    Visit Number 14    Number of Visits 20   per recert 04/03/4740   Authorization Type [employer], covered 100% as ded and OOPM met; VL:  30    PT Start Time 0717    PT Stop Time 0759    PT Time Calculation (min) 42 min    Equipment Utilized During Treatment Gait belt    Activity Tolerance Patient tolerated treatment well    Behavior During Therapy WFL for tasks assessed/performed           Past Medical History:  Diagnosis Date  . Anxiety    situational  . BPH (benign prostatic hyperplasia)    on flomax  . Bundle branch block    per prior PCP records  . CHF (congestive heart failure) ([place])   . COPD (chronic obstructive pulmonary disease) ([place])   . Diverticulosis    by colonoscopy  . Eczema    per prior PCP records  . Elevated PSA 2015   peaked 6s, s/p benign biopsy 2014, sees urology [name] ([place])  . Essential tremor   . GERD (gastroesophageal reflux disease)    per prior PCP records  . History of panic attacks    per prior PCP records  . HTN (hypertension)   . Mild intermittent asthma in adult without complication   . Obesity, Class I, BMI 30-34.9   . Osteoporosis    DEXA 06/2013 with osteopenia - h/o ?wrist/hip fracture from AVN from chronic steroid use (asthma), took reclast for 1 year  . Renal artery stenosis in 1 of 2 vessels ([place]) 2011   by [place]  . Seasonal allergies   . Sleep apnea   . Thoracic scoliosis childhood  . Transaminitis    per prior PCP records    Past Surgical History:  Procedure Laterality Date  . APPLICATION OF WOUND VAC Left 05/09/2018    Procedure: Application Of Wound Vac;  Surgeon: Newt Minion, MD;  Location: [facility];  Service: Orthopedics;  Laterality: Left;  . BIOPSY  04/30/2019   Procedure: BIOPSY;  Surgeon: Juanita Craver, MD;  [facility];  Service: Endoscopy;;  . COLONOSCOPY  12/2013   polyps, int hem, diverticulosis, rpt 5 yrs ([place])  . COLONOSCOPY WITH PROPOFOL N/A 04/30/2019   Procedure: COLONOSCOPY WITH PROPOFOL;  Surgeon: Juanita Craver, MD;  [facility];  Service: Endoscopy;  Laterality: N/A;  . ELBOW SURGERY Right 2008   golfer's elbow  . ESOPHAGOGASTRODUODENOSCOPY (EGD) WITH PROPOFOL N/A 04/30/2019   Procedure: ESOPHAGOGASTRODUODENOSCOPY (EGD) WITH PROPOFOL;  Surgeon: Juanita Craver, MD;  [facility];  Service: Endoscopy;  Laterality: N/A;  . INGUINAL HERNIA REPAIR Bilateral childhood & ~1995  . LAPAROTOMY N/A 01/05/2019   Procedure: EXPLORATORY LAPAROTOMY graham patch repair;  Surgeon: Benjamine Sprague, DO;  Location: [facility];  Service: General;  Laterality: N/A;  . NASAL SEPTUM SURGERY  2013   deviated septum  . ORIF ANKLE FRACTURE Left 05/09/2018   Procedure: OPEN REDUCTION INTERNAL FIXATION LEFT LISFRANC FRACTURE/DISLOCATION;  Surgeon: Newt Minion, MD;  Location: [facility];  Service: Orthopedics;  Laterality: Left;  . POLYPECTOMY  04/30/2019   Procedure:  POLYPECTOMY;  Surgeon: Juanita Craver, MD;  [facility];  Service: Endoscopy;;  . TEE WITHOUT CARDIOVERSION N/A 06/29/2019   Procedure: TRANSESOPHAGEAL ECHOCARDIOGRAM (TEE);  Surgeon: Acie Fredrickson Wonda Cheng, MD;  Location: [facility];  Service: Cardiovascular;  Laterality: N/A;  . US ECHOCARDIOGRAPHY  02/2009   WNL, EF >55% Claiborne Billings)  . US RENAL/AORTA Right 05/2009   1-59% diameter reduction renal artery, rec rpt 2 yrs    There were no vitals filed for this visit.   Subjective Assessment - 09/11/19 0718    Subjective Had the CT scan yesterday.  Coming along with balance and walking.  Balance still an issue.    Pertinent History L  foot fracture, sepsis with septic shock, acute renal railure, OSA, GERD, anxiety, HTN, restrictive lung disease with scoliosis, neuropathy    Patient Stated Goals Pt's goal for therapy are for strengthening and endurance.    Currently in Pain? Yes    Pain Score 2     Pain Location Back    Pain Orientation Right;Lower    Pain Descriptors / Indicators Sore    Pain Type Acute pain    Pain Onset Today    Pain Frequency Intermittent    Aggravating Factors  worse over night    Pain Relieving Factors better as I've moved this morning                             [facility] Adult PT Treatment/Exercise - 09/11/19 0719      Transfers   Transfers Sit to Stand;Stand to Sit    Sit to Stand 6: Modified independent (Device/Increase time);Without upper extremity assist;From bed    Stand to Sit 6: Modified independent (Device/Increase time);Without upper extremity assist;To bed      Ambulation/Gait   Ambulation/Gait Yes    Ambulation/Gait Assistance 6: Modified independent (Device/Increase time)    Ambulation/Gait Assistance Details Last 200 ft with environmental scanning tasks    Ambulation Distance (Feet) 600 Feet    Assistive device None    Gait Pattern Step-through pattern;Wide base of support    Ambulation Surface Level;Indoor    Stairs Yes    Stairs Assistance 5: Supervision    Stairs Assistance Details (indicate cue type and reason) Performed 2 reps with single handrail, 2 reps with no rails/holding cup, 2 reps no rails, carrying block in bilat hands.  Cues to carry block (simulating tray at home) forward slightly so he can see his feet.    Stair Management Technique Other (comment);Alternating pattern;Forwards   See above   Number of Stairs 4   x 6 reps total   Height of Stairs 6               Balance Exercises - 09/11/19 0001      Balance Exercises: Standing   Standing Eyes Closed Narrow base of support (BOS);Wide (BOA);Head turns;Foam/compliant surface;Other reps  (comment);30 secs;Limitations   Head turns/nods x 10 reps; EC x 30 sec head steady   Standing Eyes Closed Limitations on airex in corner with chair in front for safety:  head motions left<>right, then up<>down. min guard for balance with cues on posture and weight shifting to assist with balance.     SLS with Vectors Foam/compliant surface;Intermittent upper extremity assist;Limitations    SLS with Vectors Limitations Alt step taps to 6" step, then 6/12/6" step; then alt step taps to 1" step, light UE support, while standing on airex    Tandem Gait  Forward;Retro;Intermittent upper extremity support;3 reps;Limitations;Foam/compliant surface    Tandem Gait Limitations on blue foam beam- then progress to tandem march forward in at counter, 2 reps    Partial Tandem Stance Eyes closed;Foam/compliant surface;3 reps;20 secs;Limitations    Partial Tandem Stance Limitations on airex in corner with chair in front for safety- 3 reps with each foot forward with up to min assist for balance.   Pt tends to have weightshift more towards R and posterior direction.    Sidestepping Foam/compliant support;Upper extremity support;3 reps    Sidestepping Limitations Progressed to sidestep squats, 2 reps R and L, with UE support at counter and min guard assist.    Other Standing Exercises Verbally educated pt to work on corner balance exercises at home with addition of foam             PT Education - 09/11/19 0757    Education Details Verbally instructed pt to perform balance exercises on cushion at home    Person(s) Educated Patient    Methods Explanation    Comprehension Verbalized understanding               PT Long Term Goals - 08/28/19 1221      PT LONG TERM GOAL #1   Title Pt will be independent with progression of HEP for improved strength, balance, transfers, and gait.  TARGET 10/02/2019    Time 5    Period Weeks    Status Revised      PT LONG TERM GOAL #2   Title Pt will improve FGA score to  at least 25/30 for decreased fall risk with improved dynamic gait.    Baseline FGA 22/30    Time 5    Period Weeks    Status New      PT LONG TERM GOAL #3   Title Pt will improve 6 MWT score to at least 1400 ft for improved gait efficiency and endurance.    Baseline 1246 ft    Time 5    Period Weeks    Status New      PT LONG TERM GOAL #4   Title Pt will negotiate at least 4 steps carrying object in both hands, modified independently, for improved stair negotiation in and out of home.    Time 5    Period Weeks    Status New      PT LONG TERM GOAL #5   Title Pt will report ambulating in his neighborhood at least 20 minutes using walking pole only, for improved gait independence.    Time 5    Period Weeks    Status New                 Plan - 09/11/19 1427    Clinical Impression Statement Pt able to carry box up and down steps x 2 reps with supervision today, simulating carrying tray of food out to grill.  With corner balance exercises on foam, pt noted to have decreased reliance on UE support at chair.  With partial tandem stance, he does tend to lean R and posteriorly.  With single limb stance on compliant activities, he still needs UE support.  He will continue to beneift from skilled PT to further address balance towards goals.    Personal Factors and Comorbidities Comorbidity 3+    Comorbidities history of familial tremors, restrictive lung disease with scoliosis, perforated duodenal ulcer, hepatic steatosis, moderate to excessive alcohol use, left Lisfranc fracture with dislocations s/p ORIF with  onset of neuropathy, B12 deficiency, gait disorder with history of falls; R 5th metatarsal fx s/p fall 06/2019    Examination-Activity Limitations Locomotion Level;Transfers;Stand;Stairs    Examination-Participation Restrictions Church;Community Activity;Other   Fly fishing, golf, pilates   Stability/Clinical Decision Making Evolving/Moderate complexity    Rehab Potential Good     PT Frequency 2x / week    PT Duration Other (comment)   5 weeks, per recert 0/38/8828   PT Treatment/Interventions ADLs/Self Care Home Management;Gait training;Stair training;Functional mobility training;Therapeutic activities;Therapeutic exercise;Balance training;Neuromuscular re-education;Patient/family education    PT Next Visit Plan continue compliant surface work, (ask how corner balance ex went on foam at home) SLS activities on compliant surfaces; stair negotiation with carrying items; dynamic gait activities on unlevel surfaces    Consulted and Agree with Plan of Care Patient           Patient will benefit from skilled therapeutic intervention in order to improve the following deficits and impairments:  Abnormal gait, Difficulty walking, Decreased endurance, Decreased activity tolerance, Decreased balance, Decreased mobility, Decreased strength, Postural dysfunction  Visit Diagnosis: Unsteadiness on feet     Problem List Patient Active Problem List   Diagnosis Date Noted  . Neuropathy 07/20/2019  . Hypomagnesemia   . Macrocytic anemia   . Bacteremia   . Debility 07/07/2019  . Acute respiratory failure with hypoxia ([place])   . Atelectasis   . Enterococcal bacteremia 06/26/2019  . AKI (acute kidney injury) ([place]) 06/25/2019  . Perforated viscus 01/06/2019  . Severe sepsis with septic shock ([place]) 01/06/2019  . Acute renal failure ([place]) 01/06/2019  . Hyperkalemia 01/06/2019  . Hyponatremia 01/06/2019  . Lactic acidosis 01/06/2019  . Onychomycosis 05/19/2018  . Foot fracture, left, closed, initial encounter 05/09/2018  . Lisfranc dislocation, left, initial encounter   . Acute bronchitis 02/25/2018  . Primary pulmonary hypertension ([place]) 07/09/2017  . Diastolic dysfunction 00/34/9179  . OSA (obstructive sleep apnea) 07/08/2017  . SOB (shortness of breath) 07/02/2017  . Chronic respiratory failure with hypoxia and hypercapnia ([place]) 07/02/2017  . Cardiomegaly 07/02/2017  .  Exposure keratitis 02/17/2017  . Pedal edema 12/18/2016  . Transaminitis 12/18/2016  . Bilateral pes planus 12/18/2016  . Psoriasis-eczema overlap condition 07/30/2016  . High serum high density lipoprotein (HDL) 09/26/2015  . Epistaxis 09/26/2015  . Renal artery stenosis in 1 of 2 vessels ([place])   . Essential tremor   . Elevated PSA   . GERD (gastroesophageal reflux disease)   . Health maintenance examination 10/08/2014  . Anxiety state 10/08/2014  . Obesity, Class I, BMI 30-34.9   . Mild intermittent asthma in adult without complication   . Seasonal allergies   . HTN (hypertension)   . Scoliosis   . Osteoporosis     Amran Malter W. 09/11/2019, 2:30 PM Frazier Butt., PT  [facility] [address] [place], [place], [address] Phone: [phone]   Fax:  [phone]  Name: Louis [name] MRN: [id number] Date of Birth: 05-21-1955

## 2019-09-16 ENCOUNTER — Ambulatory Visit: Payer: 59 | Admitting: Physical Therapy

## 2019-09-16 ENCOUNTER — Other Ambulatory Visit: Payer: Self-pay

## 2019-09-16 ENCOUNTER — Encounter: Payer: Self-pay | Admitting: Physical Therapy

## 2019-09-16 DIAGNOSIS — R2689 Other abnormalities of gait and mobility: Secondary | ICD-10-CM

## 2019-09-16 DIAGNOSIS — R2681 Unsteadiness on feet: Secondary | ICD-10-CM | POA: Diagnosis not present

## 2019-09-16 DIAGNOSIS — M6281 Muscle weakness (generalized): Secondary | ICD-10-CM

## 2019-09-16 NOTE — Therapy (Signed)
[facility] [address] [place], [place], [address] Phone: [phone]   Fax:  [phone]  Physical Therapy Treatment  Patient Details  Name: Louis [name] MRN: [id number] Date of Birth: Jan 11, 1955 Referring Provider (PT): Marlowe Shores, PA-C Grant Fontana, MD)   Encounter Date: 09/16/2019   PT End of Session - 09/16/19 0804    Visit Number 15    Number of Visits 20   per recert 9/73/5329   Authorization Type [employer], covered 100% as ded and OOPM met; VL:  30    PT Start Time 0802    PT Stop Time 0845    PT Time Calculation (min) 43 min    Equipment Utilized During Treatment Gait belt    Activity Tolerance Patient tolerated treatment well    Behavior During Therapy WFL for tasks assessed/performed           Past Medical History:  Diagnosis Date  . Anxiety    situational  . BPH (benign prostatic hyperplasia)    on flomax  . Bundle branch block    per prior PCP records  . CHF (congestive heart failure) ([place])   . COPD (chronic obstructive pulmonary disease) ([place])   . Diverticulosis    by colonoscopy  . Eczema    per prior PCP records  . Elevated PSA 2015   peaked 6s, s/p benign biopsy 2014, sees urology [name] ([place])  . Essential tremor   . GERD (gastroesophageal reflux disease)    per prior PCP records  . History of panic attacks    per prior PCP records  . HTN (hypertension)   . Mild intermittent asthma in adult without complication   . Obesity, Class I, BMI 30-34.9   . Osteoporosis    DEXA 06/2013 with osteopenia - h/o ?wrist/hip fracture from AVN from chronic steroid use (asthma), took reclast for 1 year  . Renal artery stenosis in 1 of 2 vessels ([place]) 2011   by [place]  . Seasonal allergies   . Sleep apnea   . Thoracic scoliosis childhood  . Transaminitis    per prior PCP records    Past Surgical History:  Procedure Laterality Date  . APPLICATION OF WOUND VAC Left 05/09/2018    Procedure: Application Of Wound Vac;  Surgeon: Newt Minion, MD;  Location: [place];  Service: Orthopedics;  Laterality: Left;  . BIOPSY  04/30/2019   Procedure: BIOPSY;  Surgeon: Juanita Craver, MD;  [facility];  Service: Endoscopy;;  . COLONOSCOPY  12/2013   polyps, int hem, diverticulosis, rpt 5 yrs ([place])  . COLONOSCOPY WITH PROPOFOL N/A 04/30/2019   Procedure: COLONOSCOPY WITH PROPOFOL;  Surgeon: Juanita Craver, MD;  [facility];  Service: Endoscopy;  Laterality: N/A;  . ELBOW SURGERY Right 2008   golfer's elbow  . ESOPHAGOGASTRODUODENOSCOPY (EGD) WITH PROPOFOL N/A 04/30/2019   Procedure: ESOPHAGOGASTRODUODENOSCOPY (EGD) WITH PROPOFOL;  Surgeon: Juanita Craver, MD;  [facility];  Service: Endoscopy;  Laterality: N/A;  . INGUINAL HERNIA REPAIR Bilateral childhood & ~1995  . LAPAROTOMY N/A 01/05/2019   Procedure: EXPLORATORY LAPAROTOMY graham patch repair;  Surgeon: Benjamine Sprague, DO;  Location: [facility];  Service: General;  Laterality: N/A;  . NASAL SEPTUM SURGERY  2013   deviated septum  . ORIF ANKLE FRACTURE Left 05/09/2018   Procedure: OPEN REDUCTION INTERNAL FIXATION LEFT LISFRANC FRACTURE/DISLOCATION;  Surgeon: Newt Minion, MD;  Location: [facility];  Service: Orthopedics;  Laterality: Left;  . POLYPECTOMY  04/30/2019   Procedure:  POLYPECTOMY;  Surgeon: Juanita Craver, MD;  [facility];  Service: Endoscopy;;  . TEE WITHOUT CARDIOVERSION N/A 06/29/2019   Procedure: TRANSESOPHAGEAL ECHOCARDIOGRAM (TEE);  Surgeon: Acie Fredrickson Wonda Cheng, MD;  Location: [facility];  Service: Cardiovascular;  Laterality: N/A;  . US ECHOCARDIOGRAPHY  02/2009   WNL, EF >55% Claiborne Billings)  . US RENAL/AORTA Right 05/2009   1-59% diameter reduction renal artery, rec rpt 2 yrs    There were no vitals filed for this visit.   Subjective Assessment - 09/16/19 0804    Subjective No new complaints. No falls or pain to report.    Pertinent History L foot fracture, sepsis with septic shock, acute  renal railure, OSA, GERD, anxiety, HTN, restrictive lung disease with scoliosis, neuropathy    Patient Stated Goals Pt's goal for therapy are for strengthening and endurance.    Currently in Pain? No/denies    Pain Score 0-No pain                  [facility] Adult PT Treatment/Exercise - 09/16/19 0805      Transfers   Transfers Sit to Stand;Stand to Sit    Sit to Stand 6: Modified independent (Device/Increase time);Without upper extremity assist;From bed    Stand to Sit 6: Modified independent (Device/Increase time);Without upper extremity assist;To bed      Ambulation/Gait   Ambulation/Gait Yes    Ambulation/Gait Assistance 6: Modified independent (Device/Increase time);5: Supervision    Ambulation/Gait Assistance Details had pt scanning enviroment randomly all directions with supervision, otherwise mod I.    Ambulation Distance (Feet) 1000 Feet    Assistive device None    Gait Pattern Step-through pattern;Wide base of support    Ambulation Surface Level;Unlevel;Indoor;Outdoor;Paved      High Level Balance   High Level Balance Activities Side stepping;Marching forwards;Marching backwards;Tandem walking;Head turns   tandem fwd/bwd   High Level Balance Comments on red/blue mats: 3 laps each with min guard to min assist, no UE support. fwd/bwd gait with head movements left<>right, then up<>down with increased assist needed with up<>down head movements.                Balance Exercises - 09/16/19 0823      Balance Exercises: Standing   Standing Eyes Closed Narrow base of support (BOS);Wide (BOA);Head turns;Foam/compliant surface;Other reps (comment);30 secs;Limitations    Standing Eyes Closed Limitations on dense open blue foam- feet together for EC no head movements, progressing to feet wider apart for EC head movements left<>right, up<>down with up to min assist for balance due to increased postural sway. no UE support.     SLS with Vectors Foam/compliant surface;Intermittent  upper extremity assist;Other reps (comment);Limitations    SLS with Vectors Limitations on balance board in ant/post direction with 2 tall cones- alternating fwd foot taps, cross foot taps, fwd double foot taps and cross double foot taps for 10 reps each with min guard to min assist. pt started with light UE touch on bars progressing to no UE support for last 3-4 reps of each movement.     Tandem Gait Forward;Retro    Tandem Gait Limitations on blue foam beam with heel taps to floor before stepping onto beam for 3 laps each way fwd/bwd, cues on posture and step placement on beam. light support on bars needed     Sidestepping Foam/compliant support;Upper extremity support;3 reps    Sidestepping Limitations on blue foam beam for 3 laps toward each side with cues on step length and weight shifting.  no UE support needed on bars with min guard assist for balance.                   PT Long Term Goals - 08/28/19 1221      PT LONG TERM GOAL #1   Title Pt will be independent with progression of HEP for improved strength, balance, transfers, and gait.  TARGET 10/02/2019    Time 5    Period Weeks    Status Revised      PT LONG TERM GOAL #2   Title Pt will improve FGA score to at least 25/30 for decreased fall risk with improved dynamic gait.    Baseline FGA 22/30    Time 5    Period Weeks    Status New      PT LONG TERM GOAL #3   Title Pt will improve 6 MWT score to at least 1400 ft for improved gait efficiency and endurance.    Baseline 1246 ft    Time 5    Period Weeks    Status New      PT LONG TERM GOAL #4   Title Pt will negotiate at least 4 steps carrying object in both hands, modified independently, for improved stair negotiation in and out of home.    Time 5    Period Weeks    Status New      PT LONG TERM GOAL #5   Title Pt will report ambulating in his neighborhood at least 20 minutes using walking pole only, for improved gait independence.    Time 5    Period Weeks     Status New                 Plan - 09/16/19 0805    Clinical Impression Statement Today's skilled session continued to focus on dynamic gait and balance reactions on compliant surfaces with up to min assist needed at times. No other issues noted or reported in session. The pt is progressing toward goals and should benefit from continued PT to progress toward unmet goals.    Personal Factors and Comorbidities Comorbidity 3+    Comorbidities history of familial tremors, restrictive lung disease with scoliosis, perforated duodenal ulcer, hepatic steatosis, moderate to excessive alcohol use, left Lisfranc fracture with dislocations s/p ORIF with onset of neuropathy, B12 deficiency, gait disorder with history of falls; R 5th metatarsal fx s/p fall 06/2019    Examination-Activity Limitations Locomotion Level;Transfers;Stand;Stairs    Examination-Participation Restrictions Church;Community Activity;Other   Fly fishing, golf, pilates   Stability/Clinical Decision Making Evolving/Moderate complexity    Rehab Potential Good    PT Frequency 2x / week    PT Duration Other (comment)   5 weeks, per recert 5/46/5035   PT Treatment/Interventions ADLs/Self Care Home Management;Gait training;Stair training;Functional mobility training;Therapeutic activities;Therapeutic exercise;Balance training;Neuromuscular re-education;Patient/family education    PT Next Visit Plan continue compliant surface work,SLS activities on compliant surfaces; stair negotiation with carrying items; dynamic gait activities on unlevel surfaces    Consulted and Agree with Plan of Care Patient           Patient will benefit from skilled therapeutic intervention in order to improve the following deficits and impairments:  Abnormal gait, Difficulty walking, Decreased endurance, Decreased activity tolerance, Decreased balance, Decreased mobility, Decreased strength, Postural dysfunction  Visit Diagnosis: Unsteadiness on feet  Other  abnormalities of gait and mobility  Muscle weakness (generalized)     Problem List Patient Active Problem List   Diagnosis  Date Noted  . Neuropathy 07/20/2019  . Hypomagnesemia   . Macrocytic anemia   . Bacteremia   . Debility 07/07/2019  . Acute respiratory failure with hypoxia ([place])   . Atelectasis   . Enterococcal bacteremia 06/26/2019  . AKI (acute kidney injury) ([place]) 06/25/2019  . Perforated viscus 01/06/2019  . Severe sepsis with septic shock ([place]) 01/06/2019  . Acute renal failure ([place]) 01/06/2019  . Hyperkalemia 01/06/2019  . Hyponatremia 01/06/2019  . Lactic acidosis 01/06/2019  . Onychomycosis 05/19/2018  . Foot fracture, left, closed, initial encounter 05/09/2018  . Lisfranc dislocation, left, initial encounter   . Acute bronchitis 02/25/2018  . Primary pulmonary hypertension ([place]) 07/09/2017  . Diastolic dysfunction 65/53/7482  . OSA (obstructive sleep apnea) 07/08/2017  . SOB (shortness of breath) 07/02/2017  . Chronic respiratory failure with hypoxia and hypercapnia ([place]) 07/02/2017  . Cardiomegaly 07/02/2017  . Exposure keratitis 02/17/2017  . Pedal edema 12/18/2016  . Transaminitis 12/18/2016  . Bilateral pes planus 12/18/2016  . Psoriasis-eczema overlap condition 07/30/2016  . High serum high density lipoprotein (HDL) 09/26/2015  . Epistaxis 09/26/2015  . Renal artery stenosis in 1 of 2 vessels ([place])   . Essential tremor   . Elevated PSA   . GERD (gastroesophageal reflux disease)   . Health maintenance examination 10/08/2014  . Anxiety state 10/08/2014  . Obesity, Class I, BMI 30-34.9   . Mild intermittent asthma in adult without complication   . Seasonal allergies   . HTN (hypertension)   . Scoliosis   . Osteoporosis     Willow Ora, PTA, [facility] [address] [place], [place] [address] [phone] 09/16/19, 9:22 AM   Name: Louis [name] MRN: [id number] Date of Birth: October 24, 1955

## 2019-09-17 DIAGNOSIS — G4733 Obstructive sleep apnea (adult) (pediatric): Secondary | ICD-10-CM

## 2019-09-17 NOTE — Telephone Encounter (Signed)
Okay to provide prescription for POC for travel 2 L blended into CPAP

## 2019-09-17 NOTE — Telephone Encounter (Signed)
Dr Elsworth Soho, please advise on pt email thanks:  Louis [name] sent to [facility],   I will be traveling out of town for the weeks of October 17-23 and 24-31, 2021. I would like to rent a portable oxygen concentrator for use with my CPAP during this time. I have contacted [employer] and they told me they need a prescription from your office before I can get a portable concentrator (and before they can check with my insurance). They asked that the prescription be faxed to them at [phone]. Thanks.

## 2019-09-18 ENCOUNTER — Ambulatory Visit: Payer: 59 | Admitting: Neurology

## 2019-09-18 ENCOUNTER — Telehealth: Payer: Self-pay | Admitting: Pulmonary Disease

## 2019-09-18 ENCOUNTER — Ambulatory Visit: Payer: 59 | Admitting: Physical Therapy

## 2019-09-18 ENCOUNTER — Other Ambulatory Visit: Payer: Self-pay

## 2019-09-18 DIAGNOSIS — R2681 Unsteadiness on feet: Secondary | ICD-10-CM | POA: Diagnosis not present

## 2019-09-18 DIAGNOSIS — R2689 Other abnormalities of gait and mobility: Secondary | ICD-10-CM

## 2019-09-18 NOTE — Telephone Encounter (Signed)
Spoke with the pt and notified of PCC's response and he verbalized understanding.

## 2019-09-18 NOTE — Telephone Encounter (Signed)
After further investigation this patient is only on Nocturnal Oxygen so the patient does not qualify for portable o2 equipment.   Also [employer] does not have a travel O2 program which per the office visit notes that's what the POC was requested for. We are however affiliated with [email or web address]. You can let patients know to go online and contact [email or web address] about any travel needs that they have.   Let [place] know if you need anything else.  Thank you!

## 2019-09-18 NOTE — Therapy (Signed)
[facility] [address] [place], [place], [address] Phone: [phone]   Fax:  [phone]  Physical Therapy Treatment  Patient Details  Name: Louis [name] MRN: [id number] Date of Birth: [birth year] Referring Provider (PT): Marlowe Shores, PA-C Grant Fontana, MD)   Encounter Date: 09/18/2019   PT End of Session - 09/18/19 1414    Visit Number 16    Number of Visits 20   per recert 7/32/2025   Authorization Type [employer], covered 100% as ded and OOPM met; VL:  30    PT Start Time 1020    PT Stop Time 1100    PT Time Calculation (min) 40 min    Equipment Utilized During Treatment Gait belt    Activity Tolerance Patient tolerated treatment well    Behavior During Therapy WFL for tasks assessed/performed           Past Medical History:  Diagnosis Date  . Anxiety    situational  . BPH (benign prostatic hyperplasia)    on flomax  . Bundle branch block    per prior PCP records  . CHF (congestive heart failure) ([place])   . COPD (chronic obstructive pulmonary disease) ([place])   . Diverticulosis    by colonoscopy  . Eczema    per prior PCP records  . Elevated PSA 2015   peaked 6s, s/p benign biopsy 2014, sees urology [name] ([place])  . Essential tremor   . GERD (gastroesophageal reflux disease)    per prior PCP records  . History of panic attacks    per prior PCP records  . HTN (hypertension)   . Mild intermittent asthma in adult without complication   . Obesity, Class I, BMI 30-34.9   . Osteoporosis    DEXA 06/2013 with osteopenia - h/o ?wrist/hip fracture from AVN from chronic steroid use (asthma), took reclast for 1 year  . Renal artery stenosis in 1 of 2 vessels ([place]) 2011   by [place]  . Seasonal allergies   . Sleep apnea   . Thoracic scoliosis childhood  . Transaminitis    per prior PCP records    Past Surgical History:  Procedure Laterality Date  . APPLICATION OF WOUND VAC Left 05/09/2018    Procedure: Application Of Wound Vac;  Surgeon: Newt Minion, MD;  Location: [facility];  Service: Orthopedics;  Laterality: Left;  . BIOPSY  04/30/2019   Procedure: BIOPSY;  Surgeon: Juanita Craver, MD;  Location: [facility];  Service: Endoscopy;;  . COLONOSCOPY  12/2013   polyps, int hem, diverticulosis, rpt 5 yrs ([place])  . COLONOSCOPY WITH PROPOFOL N/A 04/30/2019   Procedure: COLONOSCOPY WITH PROPOFOL;  Surgeon: Juanita Craver, MD;  Location: [facility];  Service: Endoscopy;  Laterality: N/A;  . ELBOW SURGERY Right 2008   golfer's elbow  . ESOPHAGOGASTRODUODENOSCOPY (EGD) WITH PROPOFOL N/A 04/30/2019   Procedure: ESOPHAGOGASTRODUODENOSCOPY (EGD) WITH PROPOFOL;  Surgeon: Juanita Craver, MD;  Location: [facility];  Service: Endoscopy;  Laterality: N/A;  . INGUINAL HERNIA REPAIR Bilateral childhood & ~1995  . LAPAROTOMY N/A 01/05/2019   Procedure: EXPLORATORY LAPAROTOMY graham patch repair;  Surgeon: Benjamine Sprague, DO;  Location: [facility];  Service: General;  Laterality: N/A;  . NASAL SEPTUM SURGERY  2013   deviated septum  . ORIF ANKLE FRACTURE Left 05/09/2018   Procedure: OPEN REDUCTION INTERNAL FIXATION LEFT LISFRANC FRACTURE/DISLOCATION;  Surgeon: Newt Minion, MD;  Location: [facility];  Service: Orthopedics;  Laterality: Left;  . POLYPECTOMY  04/30/2019   Procedure:  POLYPECTOMY;  Surgeon: Juanita Craver, MD;  Location: [facility];  Service: Endoscopy;;  . TEE WITHOUT CARDIOVERSION N/A 06/29/2019   Procedure: TRANSESOPHAGEAL ECHOCARDIOGRAM (TEE);  Surgeon: Acie Fredrickson Wonda Cheng, MD;  Location: [facility];  Service: Cardiovascular;  Laterality: N/A;  . US ECHOCARDIOGRAPHY  02/2009   WNL, EF >55% [facility])  . US RENAL/AORTA Right 05/2009   1-59% diameter reduction renal artery, rec rpt 2 yrs    There were no vitals filed for this visit.   Subjective Assessment - 09/18/19 1020    Subjective Typical week for me.  No changes.  Actually walked the dog yesterday with no problems.  Balance coming along but still  off-balance some.    Pertinent History L foot fracture, sepsis with septic shock, acute renal railure, OSA, GERD, anxiety, HTN, restrictive lung disease with scoliosis, neuropathy    Patient Stated Goals Pt's goal for therapy are for strengthening and endurance.    Currently in Pain? No/denies                             [facility] Adult PT Treatment/Exercise - 09/18/19 0001      High Level Balance   High Level Balance Activities Other (comment)    High Level Balance Comments Standing on Airex with alternating forward step taps to ground x 10 reps, then step up/up, down/down x 10 reps; side step up and over on Airex in between x 10 reps each direction.  Then sidestep up and over with BOSU in between with UE support x 10 reps.               Balance Exercises - 09/18/19 0001      Balance Exercises: Standing   Standing Eyes Closed Narrow base of support (BOS);Wide (BOA);Head turns;Foam/compliant surface;Other reps (comment);30 secs;Limitations    Standing Eyes Closed Limitations on dense open blue foam- feet wide, then feet together for EC head movements left<>right, up<>down with up to min assist for balance due to increased postural sway. no UE support.     SLS with Vectors Foam/compliant surface;Intermittent upper extremity assist;Other reps (comment);Limitations    SLS with Vectors Limitations on BOSU in ant/post direction with 2 tall cones- alternating fwd foot taps, cross foot taps, fwd double cross foot taps for 10 reps each with min guard to min assist. pt started with light UE touch on bars.  Attempts to go to no UE support; unable to maintain balance.    Tandem Gait Forward;Retro;3 reps    Tandem Gait Limitations on blue foam beam fwd/bwd, cues on posture and step placement on beam. light support on bars needed.  Progressed to tandem march forward/back 3 reps along blue balance beam light UE support    Sidestepping Foam/compliant support;Upper extremity support;3 reps     Sidestepping Limitations Sidestep squats on blue foam beam for 3 laps toward each side with cues on step length and weight shifting.  Light UE support on bars for balance, with PT supervision, able to maintain balance with light UE support.    Other Standing Exercises On BOSU:  anterior/posterior weigthshfiting x 10 reps. lateral weigthshfiting x 10 reps, then marching in place x 10 reps; alternating forward step taps to floor, x 5 reps    Other Standing Exercises Comments On incline surface with doubled blue mat surface:  feet apart with EO head turns x 5, head nods x 5, EC x 10 seconds.  Then repeated sequence with partial tandem  stance, no UE support, min guard assistance.           Resisted gait x 115 ft, with quick stop/starts, no LOB for improved dynamic gait.  Cues for upright posture and increased step length.       PT Long Term Goals - 08/28/19 1221      PT LONG TERM GOAL #1   Title Pt will be independent with progression of HEP for improved strength, balance, transfers, and gait.  TARGET 10/02/2019    Time 5    Period Weeks    Status Revised      PT LONG TERM GOAL #2   Title Pt will improve FGA score to at least 25/30 for decreased fall risk with improved dynamic gait.    Baseline FGA 22/30    Time 5    Period Weeks    Status New      PT LONG TERM GOAL #3   Title Pt will improve 6 MWT score to at least 1400 ft for improved gait efficiency and endurance.    Baseline 1246 ft    Time 5    Period Weeks    Status New      PT LONG TERM GOAL #4   Title Pt will negotiate at least 4 steps carrying object in both hands, modified independently, for improved stair negotiation in and out of home.    Time 5    Period Weeks    Status New      PT LONG TERM GOAL #5   Title Pt will report ambulating in his neighborhood at least 20 minutes using walking pole only, for improved gait independence.    Time 5    Period Weeks    Status New                 Plan - 09/18/19  1414    Clinical Impression Statement Compliant surface balance work performed most of PT session with pt requiring very light to no UE support at times, with min guard/min assist needed for balance.  On incline/decline compliant surface work, he does not need UE support (compared to several weeks ago needing use of single walking pole for support).  He is progressing towards goals and will continue to benefit from further skilled PT towards goals.    Personal Factors and Comorbidities Comorbidity 3+    Comorbidities history of familial tremors, restrictive lung disease with scoliosis, perforated duodenal ulcer, hepatic steatosis, moderate to excessive alcohol use, left Lisfranc fracture with dislocations s/p ORIF with onset of neuropathy, B12 deficiency, gait disorder with history of falls; R 5th metatarsal fx s/p fall 06/2019    Examination-Activity Limitations Locomotion Level;Transfers;Stand;Stairs    Examination-Participation Restrictions Church;Community Activity;Other   Fly fishing, golf, pilates   Stability/Clinical Decision Making Evolving/Moderate complexity    Rehab Potential Good    PT Frequency 2x / week    PT Duration Other (comment)   5 weeks, per recert 06/14/4313   PT Treatment/Interventions ADLs/Self Care Home Management;Gait training;Stair training;Functional mobility training;Therapeutic activities;Therapeutic exercise;Balance training;Neuromuscular re-education;Patient/family education    PT Next Visit Plan continue compliant surface work,SLS activities on compliant surfaces; stair negotiation with carrying items; dynamic gait activities on unlevel surfaces; try resisted gait and resisted standing (to simulate dog walking/dog pulling patient)    Consulted and Agree with Plan of Care Patient           Patient will benefit from skilled therapeutic intervention in order to improve the following deficits and impairments:  Abnormal gait, Difficulty walking, Decreased endurance,  Decreased activity tolerance, Decreased balance, Decreased mobility, Decreased strength, Postural dysfunction  Visit Diagnosis: Unsteadiness on feet  Other abnormalities of gait and mobility     Problem List Patient Active Problem List   Diagnosis Date Noted  . Neuropathy 07/20/2019  . Hypomagnesemia   . Macrocytic anemia   . Bacteremia   . Debility 07/07/2019  . Acute respiratory failure with hypoxia ([place])   . Atelectasis   . Enterococcal bacteremia 06/26/2019  . AKI (acute kidney injury) ([place]) 06/25/2019  . Perforated viscus 01/06/2019  . Severe sepsis with septic shock ([place]) 01/06/2019  . Acute renal failure ([place]) 01/06/2019  . Hyperkalemia 01/06/2019  . Hyponatremia 01/06/2019  . Lactic acidosis 01/06/2019  . Onychomycosis 05/19/2018  . Foot fracture, left, closed, initial encounter 05/09/2018  . Lisfranc dislocation, left, initial encounter   . Acute bronchitis 02/25/2018  . Primary pulmonary hypertension ([place]) 07/09/2017  . Diastolic dysfunction 24/23/5361  . OSA (obstructive sleep apnea) 07/08/2017  . SOB (shortness of breath) 07/02/2017  . Chronic respiratory failure with hypoxia and hypercapnia ([place]) 07/02/2017  . Cardiomegaly 07/02/2017  . Exposure keratitis 02/17/2017  . Pedal edema 12/18/2016  . Transaminitis 12/18/2016  . Bilateral pes planus 12/18/2016  . Psoriasis-eczema overlap condition 07/30/2016  . High serum high density lipoprotein (HDL) 09/26/2015  . Epistaxis 09/26/2015  . Renal artery stenosis in 1 of 2 vessels ([place])   . Essential tremor   . Elevated PSA   . GERD (gastroesophageal reflux disease)   . Health maintenance examination 10/08/2014  . Anxiety state 10/08/2014  . Obesity, Class I, BMI 30-34.9   . Mild intermittent asthma in adult without complication   . Seasonal allergies   . HTN (hypertension)   . Scoliosis   . Osteoporosis     Karlissa Aron W. 09/18/2019, 2:17 PM  Frazier Butt., PT  [facility] [address] [place], [place], [address] Phone: [phone]   Fax:  [phone]  Name: Louis [name] MRN: [id number] Date of Birth: December 20, 1955

## 2019-09-18 NOTE — Progress Notes (Signed)
He has moderate airway obstruction, FEV1 58%/1.87 He does not have significant restriction so I doubt that scoliosis plays a role His admission was for enterococcal bacteremia -complicated by hypercarbic respiratory failure which is perplexing given reasonably high FEV1.  So I wonder if other factors such as sedation, narcotics played a role? Last titration in 2019 showed CPAP of 9 cm with 2 L of oxygen requirement but he certainly has residual events on his latest download on CPAP of 11 cm and needs higher pressure .  Agree with auto CPAP up to 15 cm and based on repeat download can change to higher level of set pressure.

## 2019-09-22 ENCOUNTER — Encounter: Payer: Self-pay | Admitting: Physical Therapy

## 2019-09-22 ENCOUNTER — Other Ambulatory Visit: Payer: Self-pay

## 2019-09-22 ENCOUNTER — Ambulatory Visit: Payer: 59 | Admitting: Physical Therapy

## 2019-09-22 DIAGNOSIS — R2689 Other abnormalities of gait and mobility: Secondary | ICD-10-CM

## 2019-09-22 DIAGNOSIS — R2681 Unsteadiness on feet: Secondary | ICD-10-CM

## 2019-09-22 DIAGNOSIS — M6281 Muscle weakness (generalized): Secondary | ICD-10-CM

## 2019-09-22 NOTE — Therapy (Signed)
[facility] [address] [place], [place], [address] Phone: [phone]   Fax:  [phone]  Physical Therapy Treatment  Patient Details  Name: Louis [name] MRN: [id number] Date of Birth: 11-Dec-1955 Referring Provider (PT): Marlowe Shores, PA-C Grant Fontana, MD)   Encounter Date: 09/22/2019   PT End of Session - 09/22/19 0807    Visit Number 17    Number of Visits 20   per recert 0/16/5537   Authorization Type [employer], covered 100% as ded and OOPM met; VL:  30    PT Start Time 0802    PT Stop Time 0845    PT Time Calculation (min) 43 min    Equipment Utilized During Treatment Gait belt    Activity Tolerance Patient tolerated treatment well    Behavior During Therapy WFL for tasks assessed/performed           Past Medical History:  Diagnosis Date  . Anxiety    situational  . BPH (benign prostatic hyperplasia)    on flomax  . Bundle branch block    per prior PCP records  . CHF (congestive heart failure) ([place])   . COPD (chronic obstructive pulmonary disease) ([place])   . Diverticulosis    by colonoscopy  . Eczema    per prior PCP records  . Elevated PSA 2015   peaked 6s, s/p benign biopsy 2014, sees urology [name] ([place])  . Essential tremor   . GERD (gastroesophageal reflux disease)    per prior PCP records  . History of panic attacks    per prior PCP records  . HTN (hypertension)   . Mild intermittent asthma in adult without complication   . Obesity, Class I, BMI 30-34.9   . Osteoporosis    DEXA 06/2013 with osteopenia - h/o ?wrist/hip fracture from AVN from chronic steroid use (asthma), took reclast for 1 year  . Renal artery stenosis in 1 of 2 vessels ([place]) 2011   by [place]  . Seasonal allergies   . Sleep apnea   . Thoracic scoliosis childhood  . Transaminitis    per prior PCP records    Past Surgical History:  Procedure Laterality Date  . APPLICATION OF WOUND VAC Left 05/09/2018    Procedure: Application Of Wound Vac;  Surgeon: Newt Minion, MD;  Location: [facility];  Service: Orthopedics;  Laterality: Left;  . BIOPSY  04/30/2019   Procedure: BIOPSY;  Surgeon: Juanita Craver, MD;  [facility];  Service: Endoscopy;;  . COLONOSCOPY  12/2013   polyps, int hem, diverticulosis, rpt 5 yrs ([place])  . COLONOSCOPY WITH PROPOFOL N/A 04/30/2019   Procedure: COLONOSCOPY WITH PROPOFOL;  Surgeon: Juanita Craver, MD;  [facility];  Service: Endoscopy;  Laterality: N/A;  . ELBOW SURGERY Right 2008   golfer's elbow  . ESOPHAGOGASTRODUODENOSCOPY (EGD) WITH PROPOFOL N/A 04/30/2019   Procedure: ESOPHAGOGASTRODUODENOSCOPY (EGD) WITH PROPOFOL;  Surgeon: Juanita Craver, MD;  [facility];  Service: Endoscopy;  Laterality: N/A;  . INGUINAL HERNIA REPAIR Bilateral childhood & ~1995  . LAPAROTOMY N/A 01/05/2019   Procedure: EXPLORATORY LAPAROTOMY graham patch repair;  Surgeon: Benjamine Sprague, DO;  Location: [facility];  Service: General;  Laterality: N/A;  . NASAL SEPTUM SURGERY  2013   deviated septum  . ORIF ANKLE FRACTURE Left 05/09/2018   Procedure: OPEN REDUCTION INTERNAL FIXATION LEFT LISFRANC FRACTURE/DISLOCATION;  Surgeon: Newt Minion, MD;  Location: [facility];  Service: Orthopedics;  Laterality: Left;  . POLYPECTOMY  04/30/2019   Procedure:  POLYPECTOMY;  Surgeon: Juanita Craver, MD;  [facility];  Service: Endoscopy;;  . TEE WITHOUT CARDIOVERSION N/A 06/29/2019   Procedure: TRANSESOPHAGEAL ECHOCARDIOGRAM (TEE);  Surgeon: Acie Fredrickson Wonda Cheng, MD;  [facility]: [facility];  Service: Cardiovascular;  Laterality: N/A;  . US ECHOCARDIOGRAPHY  02/2009   WNL, EF >55% Claiborne Billings)  . US RENAL/AORTA Right 05/2009   1-59% diameter reduction renal artery, rec rpt 2 yrs    There were no vitals filed for this visit.   Subjective Assessment - 09/22/19 0805    Subjective No falls. Does report his right little toe has been painful, more when his foot rolls over onto it in the last week or  so. Has a history of breaking this toe about a year ago. Pt is not sure if it's due to soft surfaces from rehab or Pilates that is irritating it.    Pertinent History L foot fracture, sepsis with septic shock, acute renal railure, OSA, GERD, anxiety, HTN, restrictive lung disease with scoliosis, neuropathy    Patient Stated Goals Pt's goal for therapy are for strengthening and endurance.    Currently in Pain? No/denies    Pain Score 0-No pain                  [facility] Adult PT Treatment/Exercise - 09/22/19 0809      Transfers   Transfers Sit to Stand;Stand to Sit    Sit to Stand 6: Modified independent (Device/Increase time);Without upper extremity assist;From bed    Stand to Sit 6: Modified independent (Device/Increase time);Without upper extremity assist;To bed      Ambulation/Gait   Ambulation/Gait Yes    Ambulation/Gait Assistance 6: Modified independent (Device/Increase time);5: Supervision    Assistive device None    Gait Pattern Step-through pattern;Wide base of support    Ambulation Surface Level;Indoor      High Level Balance   High Level Balance Activities Tandem walking;Marching forwards;Marching backwards   tandem/toe/heel gait fwd/bwd   High Level Balance Comments on red/blue mats next to counter- 3 laps each/each way with intermittent touch to counter for balance assistance.                Balance Exercises - 09/22/19 0818      Balance Exercises: Standing   SLS with Vectors Foam/compliant surface;Intermittent upper extremity assist;Other reps (comment);Limitations    SLS with Vectors Limitations on balance board in ant/post directions with 2 tall cones in front: alternating fwd taps, cross taps, fwd double taps, cross double taps for 10 reps each. Min guard to min assist with cues for weight shifting.    Step Over Hurdles / Cones hurdles of varied heights over red/blue mats- reciprocal stepping forward for 4 laps with min guard to min assist, occasional touch  to counter as needed for balance assistance; lateral stepping over hurdles for 3 laps toward each side with min guard assist, occasional touch to counter.    Other Standing Exercises On inverted BOSU- rocking fwd/bwd, laterally for 10 reps each, then holding it steady for EC 30 sec's for 3 reps, progressing to EO for head movements left<>right, up<>down for 10 reps. Intermittent touch to bars with cues on posture and weight shifting for balance.                  PT Long Term Goals - 08/28/19 1221      PT LONG TERM GOAL #1   Title Pt will be independent with progression of HEP for improved strength, balance, transfers,  and gait.  TARGET 10/02/2019    Time 5    Period Weeks    Status Revised      PT LONG TERM GOAL #2   Title Pt will improve FGA score to at least 25/30 for decreased fall risk with improved dynamic gait.    Baseline FGA 22/30    Time 5    Period Weeks    Status New      PT LONG TERM GOAL #3   Title Pt will improve 6 MWT score to at least 1400 ft for improved gait efficiency and endurance.    Baseline 1246 ft    Time 5    Period Weeks    Status New      PT LONG TERM GOAL #4   Title Pt will negotiate at least 4 steps carrying object in both hands, modified independently, for improved stair negotiation in and out of home.    Time 5    Period Weeks    Status New      PT LONG TERM GOAL #5   Title Pt will report ambulating in his neighborhood at least 20 minutes using walking pole only, for improved gait independence.    Time 5    Period Weeks    Status New                 Plan - 09/22/19 0808    Clinical Impression Statement Today's skilled session continued to focus on balance reactions with no increase in foot/toe pain reported. The pt continues to be challenged on compliant surfaces. The pt is progressing toward goals and should benefit from continued PT to progress toward unmet goals.    Personal Factors and Comorbidities Comorbidity 3+     Comorbidities history of familial tremors, restrictive lung disease with scoliosis, perforated duodenal ulcer, hepatic steatosis, moderate to excessive alcohol use, left Lisfranc fracture with dislocations s/p ORIF with onset of neuropathy, B12 deficiency, gait disorder with history of falls; R 5th metatarsal fx s/p fall 06/2019    Examination-Activity Limitations Locomotion Level;Transfers;Stand;Stairs    Examination-Participation Restrictions Church;Community Activity;Other   Fly fishing, golf, pilates   Stability/Clinical Decision Making Evolving/Moderate complexity    Rehab Potential Good    PT Frequency 2x / week    PT Duration Other (comment)   5 weeks, per recert 7/35/3299   PT Treatment/Interventions ADLs/Self Care Home Management;Gait training;Stair training;Functional mobility training;Therapeutic activities;Therapeutic exercise;Balance training;Neuromuscular re-education;Patient/family education    PT Next Visit Plan continue compliant surface work,SLS activities on compliant surfaces; stair negotiation with carrying items; dynamic gait activities on unlevel surfaces;    Consulted and Agree with Plan of Care Patient           Patient will benefit from skilled therapeutic intervention in order to improve the following deficits and impairments:  Abnormal gait, Difficulty walking, Decreased endurance, Decreased activity tolerance, Decreased balance, Decreased mobility, Decreased strength, Postural dysfunction  Visit Diagnosis: Unsteadiness on feet  Other abnormalities of gait and mobility  Muscle weakness (generalized)     Problem List Patient Active Problem List   Diagnosis Date Noted  . Neuropathy 07/20/2019  . Hypomagnesemia   . Macrocytic anemia   . Bacteremia   . Debility 07/07/2019  . Acute respiratory failure with hypoxia ([place])   . Atelectasis   . Enterococcal bacteremia 06/26/2019  . AKI (acute kidney injury) ([place]) 06/25/2019  . Perforated viscus 01/06/2019  .  Severe sepsis with septic shock ([place]) 01/06/2019  . Acute renal failure ([place])  01/06/2019  . Hyperkalemia 01/06/2019  . Hyponatremia 01/06/2019  . Lactic acidosis 01/06/2019  . Onychomycosis 05/19/2018  . Foot fracture, left, closed, initial encounter 05/09/2018  . Lisfranc dislocation, left, initial encounter   . Acute bronchitis 02/25/2018  . Primary pulmonary hypertension ([place]) 07/09/2017  . Diastolic dysfunction 48/54/6270  . OSA (obstructive sleep apnea) 07/08/2017  . SOB (shortness of breath) 07/02/2017  . Chronic respiratory failure with hypoxia and hypercapnia ([place]) 07/02/2017  . Cardiomegaly 07/02/2017  . Exposure keratitis 02/17/2017  . Pedal edema 12/18/2016  . Transaminitis 12/18/2016  . Bilateral pes planus 12/18/2016  . Psoriasis-eczema overlap condition 07/30/2016  . High serum high density lipoprotein (HDL) 09/26/2015  . Epistaxis 09/26/2015  . Renal artery stenosis in 1 of 2 vessels ([place])   . Essential tremor   . Elevated PSA   . GERD (gastroesophageal reflux disease)   . Health maintenance examination 10/08/2014  . Anxiety state 10/08/2014  . Obesity, Class I, BMI 30-34.9   . Mild intermittent asthma in adult without complication   . Seasonal allergies   . HTN (hypertension)   . Scoliosis   . Osteoporosis     Willow Ora, PTA, [facility] [address], [address] [place], [place] [address] [phone] 09/22/19, 3:23 PM   Name: Louis [name] MRN: [id number] Date of Birth: [birth year]

## 2019-09-25 ENCOUNTER — Ambulatory Visit: Payer: 59 | Admitting: Physical Therapy

## 2019-09-25 ENCOUNTER — Other Ambulatory Visit: Payer: Self-pay

## 2019-09-25 DIAGNOSIS — R2681 Unsteadiness on feet: Secondary | ICD-10-CM | POA: Diagnosis not present

## 2019-09-25 DIAGNOSIS — R2689 Other abnormalities of gait and mobility: Secondary | ICD-10-CM

## 2019-09-25 NOTE — Therapy (Signed)
[facility] [address] [place], [place], [address] Phone: [phone]   Fax:  [phone]  Physical Therapy Treatment  Patient Details  Name: Louis [name] MRN: [id number] Date of Birth: [birth year] Referring Provider (PT): Marlowe Shores, PA-C Grant Fontana, MD)   Encounter Date: 09/25/2019   PT End of Session - 09/25/19 1432    Visit Number 18    Number of Visits 20   per recert 0/99/8338   Authorization Type [employer], covered 100% as ded and OOPM met; VL:  30    PT Start Time 0718    PT Stop Time 0758    PT Time Calculation (min) 40 min    Equipment Utilized During Treatment Gait belt    Activity Tolerance Patient tolerated treatment well    Behavior During Therapy [facility] for tasks assessed/performed           Past Medical History:  Diagnosis Date  . Anxiety    situational  . BPH (benign prostatic hyperplasia)    on flomax  . Bundle branch block    per prior PCP records  . CHF (congestive heart failure) ([place])   . COPD (chronic obstructive pulmonary disease) ([place])   . Diverticulosis    by colonoscopy  . Eczema    per prior PCP records  . Elevated PSA 2015   peaked 6s, s/p benign biopsy 2014, sees urology [name] ([place])  . Essential tremor   . GERD (gastroesophageal reflux disease)    per prior PCP records  . History of panic attacks    per prior PCP records  . HTN (hypertension)   . Mild intermittent asthma in adult without complication   . Obesity, Class I, BMI 30-34.9   . Osteoporosis    DEXA 06/2013 with osteopenia - h/o ?wrist/hip fracture from AVN from chronic steroid use (asthma), took reclast for 1 year  . Renal artery stenosis in 1 of 2 vessels ([place]) 2011   by [place]  . Seasonal allergies   . Sleep apnea   . Thoracic scoliosis childhood  . Transaminitis    per prior PCP records    Past Surgical History:  Procedure Laterality Date  . APPLICATION OF WOUND VAC Left 05/09/2018    Procedure: Application Of Wound Vac;  Surgeon: Newt Minion, MD;  Location: [facility];  Service: Orthopedics;  Laterality: Left;  . BIOPSY  04/30/2019   Procedure: BIOPSY;  Surgeon: Juanita Craver, MD;  [facility];  Service: Endoscopy;;  . COLONOSCOPY  12/2013   polyps, int hem, diverticulosis, rpt 5 yrs ([place])  . COLONOSCOPY WITH PROPOFOL N/A 04/30/2019   Procedure: COLONOSCOPY WITH PROPOFOL;  Surgeon: Juanita Craver, MD;  [facility];  Service: Endoscopy;  Laterality: N/A;  . ELBOW SURGERY Right 2008   golfer's elbow  . ESOPHAGOGASTRODUODENOSCOPY (EGD) WITH PROPOFOL N/A 04/30/2019   Procedure: ESOPHAGOGASTRODUODENOSCOPY (EGD) WITH PROPOFOL;  Surgeon: Juanita Craver, MD;  [facility];  Service: Endoscopy;  Laterality: N/A;  . INGUINAL HERNIA REPAIR Bilateral childhood & ~1995  . LAPAROTOMY N/A 01/05/2019   Procedure: EXPLORATORY LAPAROTOMY graham patch repair;  Surgeon: Benjamine Sprague, DO;  Location: [facility];  Service: General;  Laterality: N/A;  . NASAL SEPTUM SURGERY  2013   deviated septum  . ORIF ANKLE FRACTURE Left 05/09/2018   Procedure: OPEN REDUCTION INTERNAL FIXATION LEFT LISFRANC FRACTURE/DISLOCATION;  Surgeon: Newt Minion, MD;  Location: [facility];  Service: Orthopedics;  Laterality: Left;  . POLYPECTOMY  04/30/2019   Procedure:  POLYPECTOMY;  Surgeon: Juanita Craver, MD;  [facility];  Service: Endoscopy;;  . TEE WITHOUT CARDIOVERSION N/A 06/29/2019   Procedure: TRANSESOPHAGEAL ECHOCARDIOGRAM (TEE);  Surgeon: Acie Fredrickson Wonda Cheng, MD;  Location: [facility];  Service: Cardiovascular;  Laterality: N/A;  . US ECHOCARDIOGRAPHY  02/2009   WNL, EF >55% Claiborne Billings)  . US RENAL/AORTA Right 05/2009   1-59% diameter reduction renal artery, rec rpt 2 yrs    There were no vitals filed for this visit.   Subjective Assessment - 09/25/19 1424    Subjective Toe is fine today, no pain.  Feel like I'm doing better overall.    Pertinent History L foot fracture, sepsis with  septic shock, acute renal railure, OSA, GERD, anxiety, HTN, restrictive lung disease with scoliosis, neuropathy    Patient Stated Goals Pt's goal for therapy are for strengthening and endurance.    Currently in Pain? No/denies                             [facility] Adult PT Treatment/Exercise - 09/25/19 0001      Ambulation/Gait   Ambulation/Gait Yes    Ambulation/Gait Assistance 6: Modified independent (Device/Increase time);5: Supervision    Ambulation Distance (Feet) 600 Feet    Assistive device None    Gait Pattern Step-through pattern;Wide base of support    Ambulation Surface Level;Indoor    Gait Comments Dynamic gait activities in gym:  forward/back walking, quick turns, gait with head turns/head nods, tandem gait on solid floor in gym, forward x 10 ft, 3 reps, forward walk with EC (pt veers to L).               Balance Exercises - 09/25/19 0001      Balance Exercises: Standing   SLS with Vectors Limitations on balance board in ant/post directions with 2 tall cones in front: alternating fwd taps x 10 reps, cross taps x 10 reps, cross double taps (x 5 reps), tap to 3 cones (5 reps), intermittent UE support and min guard.    Rockerboard Anterior/posterior;EO;EC   Hip/ankle strategy work x 10 reps each   Rockerboard Limitations EO with head turns x 10, head nods x 10; EC x 10 sec, 3 reps.  EO with diagonal head motions x 10 reps.    Tandem Gait Forward;Retro;3 reps    Step Over Hurdles / Cones hurdles of varied heights over red/blue mats- reciprocal stepping forward for 4 laps with min guard; lateral stepping over hurdles for 2 laps toward each side with min guard assist    Other Standing Exercises On inverted BOSU- Holding steady for EC no head movements 10 sec x 3 reps, EO for head movements left<>right x 10, up<>down x 10                  PT Long Term Goals - 08/28/19 1221      PT LONG TERM GOAL #1   Title Pt will be independent with progression of  HEP for improved strength, balance, transfers, and gait.  TARGET 10/02/2019    Time 5    Period Weeks    Status Revised      PT LONG TERM GOAL #2   Title Pt will improve FGA score to at least 25/30 for decreased fall risk with improved dynamic gait.    Baseline FGA 22/30    Time 5    Period Weeks    Status New  PT LONG TERM GOAL #3   Title Pt will improve 6 MWT score to at least 1400 ft for improved gait efficiency and endurance.    Baseline 1246 ft    Time 5    Period Weeks    Status New      PT LONG TERM GOAL #4   Title Pt will negotiate at least 4 steps carrying object in both hands, modified independently, for improved stair negotiation in and out of home.    Time 5    Period Weeks    Status New      PT LONG TERM GOAL #5   Title Pt will report ambulating in his neighborhood at least 20 minutes using walking pole only, for improved gait independence.    Time 5    Period Weeks    Status New                 Plan - 09/25/19 1432    Clinical Impression Statement Continued to address compliant surface balance and dynamic gait activities.  For compliant surface work, he is able to hover hands or not need UE support, until most challenging activities on rockerboard and inverted BOSU, pt needs intermittent UE support.  Discussed POC and feel pt is progressing nicely towards goals with likely plan for d/c next week.    Personal Factors and Comorbidities Comorbidity 3+    Comorbidities history of familial tremors, restrictive lung disease with scoliosis, perforated duodenal ulcer, hepatic steatosis, moderate to excessive alcohol use, left Lisfranc fracture with dislocations s/p ORIF with onset of neuropathy, B12 deficiency, gait disorder with history of falls; R 5th metatarsal fx s/p fall 06/2019    Examination-Activity Limitations Locomotion Level;Transfers;Stand;Stairs    Examination-Participation Restrictions Church;Community Activity;Other   Fly fishing, golf, pilates    Stability/Clinical Decision Making Evolving/Moderate complexity    Rehab Potential Good    PT Frequency 2x / week    PT Duration Other (comment)   5 weeks, per recert 2/69/4854   PT Treatment/Interventions ADLs/Self Care Home Management;Gait training;Stair training;Functional mobility training;Therapeutic activities;Therapeutic exercise;Balance training;Neuromuscular re-education;Patient/family education    PT Next Visit Plan Check LTGs next week; check HEP (?any updates needed) and plan for d/c next week    Consulted and Agree with Plan of Care Patient           Patient will benefit from skilled therapeutic intervention in order to improve the following deficits and impairments:  Abnormal gait, Difficulty walking, Decreased endurance, Decreased activity tolerance, Decreased balance, Decreased mobility, Decreased strength, Postural dysfunction  Visit Diagnosis: Unsteadiness on feet  Other abnormalities of gait and mobility     Problem List Patient Active Problem List   Diagnosis Date Noted  . Neuropathy 07/20/2019  . Hypomagnesemia   . Macrocytic anemia   . Bacteremia   . Debility 07/07/2019  . Acute respiratory failure with hypoxia ([place])   . Atelectasis   . Enterococcal bacteremia 06/26/2019  . AKI (acute kidney injury) ([place]) 06/25/2019  . Perforated viscus 01/06/2019  . Severe sepsis with septic shock ([place]) 01/06/2019  . Acute renal failure ([place]) 01/06/2019  . Hyperkalemia 01/06/2019  . Hyponatremia 01/06/2019  . Lactic acidosis 01/06/2019  . Onychomycosis 05/19/2018  . Foot fracture, left, closed, initial encounter 05/09/2018  . Lisfranc dislocation, left, initial encounter   . Acute bronchitis 02/25/2018  . Primary pulmonary hypertension ([place]) 07/09/2017  . Diastolic dysfunction 62/70/3500  . OSA (obstructive sleep apnea) 07/08/2017  . SOB (shortness of breath) 07/02/2017  . Chronic  respiratory failure with hypoxia and hypercapnia ([place]) 07/02/2017  . Cardiomegaly  07/02/2017  . Exposure keratitis 02/17/2017  . Pedal edema 12/18/2016  . Transaminitis 12/18/2016  . Bilateral pes planus 12/18/2016  . Psoriasis-eczema overlap condition 07/30/2016  . High serum high density lipoprotein (HDL) 09/26/2015  . Epistaxis 09/26/2015  . Renal artery stenosis in 1 of 2 vessels ([place])   . Essential tremor   . Elevated PSA   . GERD (gastroesophageal reflux disease)   . Health maintenance examination 10/08/2014  . Anxiety state 10/08/2014  . Obesity, Class I, BMI 30-34.9   . Mild intermittent asthma in adult without complication   . Seasonal allergies   . HTN (hypertension)   . Scoliosis   . Osteoporosis     Madilyn Cephas W. 09/25/2019, 2:35 PM  Frazier Butt., PT   [facility] [address] [place], [place], [address] Phone: [phone]   Fax:  [phone]  Name: Louis [name] MRN: [id number] Date of Birth: 1955/12/22

## 2019-09-28 ENCOUNTER — Ambulatory Visit: Payer: Self-pay | Admitting: General Surgery

## 2019-09-28 DIAGNOSIS — G4733 Obstructive sleep apnea (adult) (pediatric): Secondary | ICD-10-CM

## 2019-09-28 NOTE — H&P (Signed)
History of Present Illness Louis Ok MD; 09/28/2019 2:49 PM) The patient is a 64 year old male who presents with a complaint of hernia. Patient is a 64 year old male comes in secondary to a incisional ventral hernia. 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 Subsequently the patient had some wound issues. He had some hypo albuminemia. Patient subsequently was hospitalized in [place] this year secondary to sepsis. This required intubation.  Since that time patient had some nutritional difficulties and is been supplementing with protein. Patient continues to lose weight since his hospitalization with protein supplementation. Patient does have a history of OSA, on 2 L of oxygen at night, has seen Dr. Georgina Peer his cardiologist for history of diastolic heart dysfunction which is reversed. Patient is to most recent ejection fraction was 55%. Patient sees [name] his pulmonologist.    ------------------------------------------------- The PCP is Dr. Grant Fontana He is accompanied by his wife, Louis [name].  He presented to [facility] in January, 2021, with a perforated duodenal ulcer. He underwent am exploratory lap, Graham patch on 01/05/2019 by [facility] - Dr. Lysle Pearl. He did not have any significant wound problems. He had a known diastases recti before the surgery. But over the last 3-4 months, he's noticed an increasing bulge in his upper abdomen. He was hospitalized on June 25, 2019, for enterococcal sepsis. This was possibly from a urinary tract source. He was in the ICU and hospitalized for 23 days. He had nutritional difficulties at that time. He's also developed a tremor in his hands and a neuropathy in his feet. This is possibly and vitamin related. He has noticed enlargement of the ventral hernia since that time.  He underwent an upper endoscopy by Dr. Meriel Pica in April 2021. The ulcer he had was  completely healed at that time. His H Pylori was always negative. The cause of the ulcer is unclear. It is hard to tell how much of his abdominal defect is diastases recti and is a hernia defect. He had an MRI of his abdomen in 06/27/2019 which does not show this abdominal defect well and it is not mentioned on the report. I think it is worth getting a CT scan of his abdominal wall to try to quantify this hernia defect versus a diastases.  Plan: 1. CT scan of abdominal wall to evaluate abdominal wall hernia, 2. Will follow up after CT scan  Review of Systems as stated in this history (HPI) or in the review of systems. Otherwise all other 12 point ROS are negative  Past Medical History: 1. CHF 2. OSA On CPAP since August 2019. 3. Elevated PSA sees Dr. Diona Fanti 4. HTN 5. Exploratory lap, [name] - 01/05/2019 - [facility] - Dr. Lysle Pearl 6. Peripheral neuropathy/essential tremor Dr. Loreta Ave 7. Severe scoliosis 8. Sepsis due to Enterococcus - 06/25/2019 Hospitalized for 23 days. Seen by Dr. Graylon Good for ID He was malnourished during this episode. 9. Left foot fx (Lisfranc fx) - required surgery by Dr. Sharol Given in 05/09/2018. this is doing okay. He had a right foot fracture this year treated non operatively  Social History: Married. His wife is Dr. 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  DATA REVIEWED, COUNSELING AND COORDINATION OF CARE: I have personally seen and evaluated the patient, evaluated laboratory and imaging results, formulated the assessment and plan and placed orders. This requires moderate/high medical decision making. Total time  spent with patient and charting: 50 minutes   Allergies (Chanel Teressa Senter, CMA; 09/28/2019 2:19 PM) Accupril *ANTIHYPERTENSIVES*  NSAIDs  Allergies Reconciled   Medication History (Chanel Teressa Senter, CMA; 09/28/2019  2:19 PM) amLODIPine Besylate (5MG  Tablet, Oral) Active. Gabapentin (300MG  Tablet, Oral) Active. Losartan Potassium (100MG  Tablet, Oral) Active. Metoprolol Succinate ER (50MG  Tablet ER 24HR, Oral) Active. Multivitamins (Oral) Active. Medications Reconciled    Review of Systems Louis Ok, MD; 09/28/2019 2:51 PM) General Not Present- Appetite Loss, Chills, Fatigue, Fever, Night Sweats, Weight Gain and Weight Loss. Skin Not Present- Change in Wart/Mole, Dryness, Hives, Jaundice, New Lesions, Non-Healing Wounds, Rash and Ulcer. HEENT Present- Seasonal Allergies. Not Present- Earache, Hearing Loss, Hoarseness, Nose Bleed, Oral Ulcers, Ringing in the Ears, Sinus Pain, Sore Throat, Visual Disturbances, Wears glasses/contact lenses and Yellow Eyes. Respiratory Present- Snoring. Not Present- Bloody sputum, Chronic Cough, Difficulty Breathing and Wheezing. Cardiovascular Not Present- Chest Pain, Difficulty Breathing Lying Down, Leg Cramps, Palpitations, Rapid Heart Rate, Shortness of Breath and Swelling of Extremities. Gastrointestinal Not Present- Abdominal Pain, Bloating, Bloody Stool, Change in Bowel Habits, Chronic diarrhea, Constipation, Difficulty Swallowing, Excessive gas, Gets full quickly at meals, Hemorrhoids, Indigestion, Nausea, Rectal Pain and Vomiting. Male Genitourinary Not Present- Blood in Urine, Change in Urinary Stream, Frequency, Impotence, Nocturia, Painful Urination, Urgency and Urine Leakage. Musculoskeletal Not Present- Back Pain, Joint Pain, Joint Stiffness, Muscle Pain, Muscle Weakness and Swelling of Extremities. Neurological Present- Trouble walking and Weakness. Not Present- Decreased Memory, Fainting, Headaches, Numbness, Seizures, Tingling and Tremor. Psychiatric Not Present- Anxiety, Bipolar, Change in Sleep Pattern, Depression, Fearful and Frequent crying. Endocrine Not Present- Cold Intolerance, Excessive Hunger, Hair Changes, Heat Intolerance and New  Diabetes.  Vitals (Chanel Nolan CMA; 09/28/2019 2:19 PM) 09/28/2019 2:19 PM Weight: 198.25 lb Height: 71in Body Surface Area: 2.1 m Body Mass Index: 27.65 kg/m  Temp.: 53F  Pulse: 77 (Regular)        Physical Exam Louis Ok MD; 09/28/2019 2:50 PM) The physical exam findings are as follows: Note: Constitutional: No acute distress, conversant, appears stated age  Eyes: Anicteric sclerae, moist conjunctiva, no lid lag  Neck: No thyromegaly, trachea midline, no cervical lymphadenopathy  Lungs: Clear to auscultation biilaterally, normal respiratory effot  Cardiovascular: regular rate & rhythm, no murmurs, no peripheal edema, pedal pulses 2+  GI: Soft, no masses or hepatosplenomegaly, non-tender to palpation, midline incisional hernia, diastases and hernia separation of approximately 8 cm. Patient with some skin breakdown superficially. This appears to and cephalad to the umbilicus.  MSK: Normal gait, no clubbing cyanosis, edema  Skin: No rashes, palpation reveals normal skin turgor  Psychiatric: Appropriate judgment and insight, oriented to person, place, and time    Assessment & Plan Louis Ok MD; 09/28/2019 2:51 PM) INCISIONAL HERNIA, WITHOUT OBSTRUCTION OR GANGRENE (K43.2) Impression: 64 year old male with a history of OSA, history of exploratory laparoscopy graft patch repair for a perforated duodenal ulcer, who comes in with an incisional hernia postop. 1. Patient will like to proceed For exploratory laparoscopy, lysis of adhesions, incisional hernia repair with mesh, likely retrorectus versus TAR. 2. We'll obtain clearance from [name] and Dr. Claiborne Billings. Patient will require overnight stay. 3. We'll obtain CMP, prealbumin and CBC. 4. All risks and benefits were discussed with the patient to generally include, but not limited to: infection, bleeding, damage to surrounding structures, acute and chronic nerve pain, and recurrence. Alternatives were  offered and described. All questions were answered and the patient voiced understanding of the procedure and wishes to proceed at this  point with hernia repair.

## 2019-09-28 NOTE — H&P (View-Only) (Signed)
History of Present Illness Ralene Ok MD; 09/28/2019 2:49 PM) The patient is a 64 year old male who presents with a complaint of hernia. Patient is a 64 year old male comes in secondary to a incisional ventral hernia. 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 Subsequently the patient had some wound issues. He had some hypo albuminemia. Patient subsequently was hospitalized in [place] this year secondary to sepsis. This required intubation.  Since that time patient had some nutritional difficulties and is been supplementing with protein. Patient continues to lose weight since his hospitalization with protein supplementation. Patient does have a history of OSA, on 2 L of oxygen at night, has seen Dr. Georgina Peer his cardiologist for history of diastolic heart dysfunction which is reversed. Patient is to most recent ejection fraction was 55%. Patient sees [name] his pulmonologist.    ------------------------------------------------- The PCP is Dr. Grant Fontana He is accompanied by his wife, Jhayden Demuro.  He presented to [facility] in January, 2021, with a perforated duodenal ulcer. He underwent am exploratory lap, Graham patch on 01/05/2019 by [facility] - Dr. Lysle Pearl. He did not have any significant wound problems. He had a known diastases recti before the surgery. But over the last 3-4 months, he's noticed an increasing bulge in his upper abdomen. He was hospitalized on June 25, 2019, for enterococcal sepsis. This was possibly from a urinary tract source. He was in the ICU and hospitalized for 23 days. He had nutritional difficulties at that time. He's also developed a tremor in his hands and a neuropathy in his feet. This is possibly and vitamin related. He has noticed enlargement of the ventral hernia since that time.  He underwent an upper endoscopy by Dr. Meriel Pica in April 2021. The ulcer he had was  completely healed at that time. His H Pylori was always negative. The cause of the ulcer is unclear. It is hard to tell how much of his abdominal defect is diastases recti and is a hernia defect. He had an MRI of his abdomen in 06/27/2019 which does not show this abdominal defect well and it is not mentioned on the report. I think it is worth getting a CT scan of his abdominal wall to try to quantify this hernia defect versus a diastases.  Plan: 1. CT scan of abdominal wall to evaluate abdominal wall hernia, 2. Will follow up after CT scan  Review of Systems as stated in this history (HPI) or in the review of systems. Otherwise all other 12 point ROS are negative  Past Medical History: 1. CHF 2. OSA On CPAP since August 2019. 3. Elevated PSA sees Dr. Diona Fanti 4. HTN 5. Exploratory lap, [name] - 01/05/2019 - [facility] - Dr. Lysle Pearl 6. Peripheral neuropathy/essential tremor Dr. Loreta Ave 7. Severe scoliosis 8. Sepsis due to Enterococcus - 06/25/2019 Hospitalized for 23 days. Seen by Dr. Graylon Good for ID He was malnourished during this episode. 9. Left foot fx (Lisfranc fx) - required surgery by Dr. Sharol Given in 05/09/2018. this is doing okay. He had a right foot fracture this year treated non operatively  Social History: Married. His wife is Dr. 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  DATA REVIEWED, COUNSELING AND COORDINATION OF CARE: I have personally seen and evaluated the patient, evaluated laboratory and imaging results, formulated the assessment and plan and placed orders. This requires moderate/high medical decision making. Total time  spent with patient and charting: 50 minutes   Allergies (Chanel Teressa Senter, CMA; 09/28/2019 2:19 PM) Accupril *ANTIHYPERTENSIVES*  NSAIDs  Allergies Reconciled   Medication History (Chanel Teressa Senter, CMA; 09/28/2019  2:19 PM) amLODIPine Besylate (5MG  Tablet, Oral) Active. Gabapentin (300MG  Tablet, Oral) Active. Losartan Potassium (100MG  Tablet, Oral) Active. Metoprolol Succinate ER (50MG  Tablet ER 24HR, Oral) Active. Multivitamins (Oral) Active. Medications Reconciled    Review of Systems Ralene Ok, MD; 09/28/2019 2:51 PM) General Not Present- Appetite Loss, Chills, Fatigue, Fever, Night Sweats, Weight Gain and Weight Loss. Skin Not Present- Change in Wart/Mole, Dryness, Hives, Jaundice, New Lesions, Non-Healing Wounds, Rash and Ulcer. HEENT Present- Seasonal Allergies. Not Present- Earache, Hearing Loss, Hoarseness, Nose Bleed, Oral Ulcers, Ringing in the Ears, Sinus Pain, Sore Throat, Visual Disturbances, Wears glasses/contact lenses and Yellow Eyes. Respiratory Present- Snoring. Not Present- Bloody sputum, Chronic Cough, Difficulty Breathing and Wheezing. Cardiovascular Not Present- Chest Pain, Difficulty Breathing Lying Down, Leg Cramps, Palpitations, Rapid Heart Rate, Shortness of Breath and Swelling of Extremities. Gastrointestinal Not Present- Abdominal Pain, Bloating, Bloody Stool, Change in Bowel Habits, Chronic diarrhea, Constipation, Difficulty Swallowing, Excessive gas, Gets full quickly at meals, Hemorrhoids, Indigestion, Nausea, Rectal Pain and Vomiting. Male Genitourinary Not Present- Blood in Urine, Change in Urinary Stream, Frequency, Impotence, Nocturia, Painful Urination, Urgency and Urine Leakage. Musculoskeletal Not Present- Back Pain, Joint Pain, Joint Stiffness, Muscle Pain, Muscle Weakness and Swelling of Extremities. Neurological Present- Trouble walking and Weakness. Not Present- Decreased Memory, Fainting, Headaches, Numbness, Seizures, Tingling and Tremor. Psychiatric Not Present- Anxiety, Bipolar, Change in Sleep Pattern, Depression, Fearful and Frequent crying. Endocrine Not Present- Cold Intolerance, Excessive Hunger, Hair Changes, Heat Intolerance and New  Diabetes.  Vitals (Chanel Nolan CMA; 09/28/2019 2:19 PM) 09/28/2019 2:19 PM Weight: 198.25 lb Height: 71in Body Surface Area: 2.1 m Body Mass Index: 27.65 kg/m  Temp.: 37F  Pulse: 77 (Regular)        Physical Exam Ralene Ok MD; 09/28/2019 2:50 PM) The physical exam findings are as follows: Note: Constitutional: No acute distress, conversant, appears stated age  Eyes: Anicteric sclerae, moist conjunctiva, no lid lag  Neck: No thyromegaly, trachea midline, no cervical lymphadenopathy  Lungs: Clear to auscultation biilaterally, normal respiratory effot  Cardiovascular: regular rate & rhythm, no murmurs, no peripheal edema, pedal pulses 2+  GI: Soft, no masses or hepatosplenomegaly, non-tender to palpation, midline incisional hernia, diastases and hernia separation of approximately 8 cm. Patient with some skin breakdown superficially. This appears to and cephalad to the umbilicus.  MSK: Normal gait, no clubbing cyanosis, edema  Skin: No rashes, palpation reveals normal skin turgor  Psychiatric: Appropriate judgment and insight, oriented to person, place, and time    Assessment & Plan Ralene Ok MD; 09/28/2019 2:51 PM) INCISIONAL HERNIA, WITHOUT OBSTRUCTION OR GANGRENE (K43.2) Impression: 64 year old male with a history of OSA, history of exploratory laparoscopy graft patch repair for a perforated duodenal ulcer, who comes in with an incisional hernia postop. 1. Patient will like to proceed For exploratory laparoscopy, lysis of adhesions, incisional hernia repair with mesh, likely retrorectus versus TAR. 2. We'll obtain clearance from [name] and Dr. Claiborne Billings. Patient will require overnight stay. 3. We'll obtain CMP, prealbumin and CBC. 4. All risks and benefits were discussed with the patient to generally include, but not limited to: infection, bleeding, damage to surrounding structures, acute and chronic nerve pain, and recurrence. Alternatives were  offered and described. All questions were answered and the patient voiced understanding of the procedure and wishes to proceed at this  point with hernia repair.

## 2019-09-28 NOTE — Telephone Encounter (Signed)
Patient is requesting to change to a set pressure on his CPAP. Please advise. He has an upcoming appointment but wants to try a set pressure prior.

## 2019-09-29 ENCOUNTER — Telehealth: Payer: Self-pay | Admitting: Pulmonary Disease

## 2019-09-29 ENCOUNTER — Ambulatory Visit: Payer: 59 | Attending: Internal Medicine

## 2019-09-29 DIAGNOSIS — J9602 Acute respiratory failure with hypercapnia: Secondary | ICD-10-CM

## 2019-09-29 DIAGNOSIS — Z23 Encounter for immunization: Secondary | ICD-10-CM

## 2019-09-29 DIAGNOSIS — G4733 Obstructive sleep apnea (adult) (pediatric): Secondary | ICD-10-CM

## 2019-09-29 DIAGNOSIS — J9601 Acute respiratory failure with hypoxia: Secondary | ICD-10-CM

## 2019-09-29 NOTE — Telephone Encounter (Signed)
Left message for Louis [name] to provide fax number to sent order to. Requesting order for travel oxygen concentrator. Patient is on CPAP and nocturnal oxygen. Please advise if triage can write an order for this.

## 2019-09-29 NOTE — Progress Notes (Signed)
   Covid-19 Vaccination Clinic  Name:  Janai Brannigan    MRN: [id number] DOB: Jan 04, 1955  09/29/2019  Mr. Hitchens was observed post Covid-19 immunization for 15 minutes without incident. He was provided with Vaccine Information Sheet and instruction to access the V-Safe system.   Mr. Klug was instructed to call 911 with any severe reactions post vaccine: [name] Difficulty breathing  . Swelling of face and throat  . A fast heartbeat  . A bad rash all over body  . Dizziness and weakness

## 2019-09-30 ENCOUNTER — Ambulatory Visit: Payer: 59 | Admitting: Physical Therapy

## 2019-09-30 ENCOUNTER — Other Ambulatory Visit: Payer: Self-pay

## 2019-09-30 ENCOUNTER — Telehealth: Payer: Self-pay | Admitting: *Deleted

## 2019-09-30 ENCOUNTER — Encounter: Payer: Self-pay | Admitting: Physical Therapy

## 2019-09-30 DIAGNOSIS — R2681 Unsteadiness on feet: Secondary | ICD-10-CM | POA: Diagnosis not present

## 2019-09-30 DIAGNOSIS — R2689 Other abnormalities of gait and mobility: Secondary | ICD-10-CM

## 2019-09-30 DIAGNOSIS — M6281 Muscle weakness (generalized): Secondary | ICD-10-CM

## 2019-09-30 NOTE — Telephone Encounter (Signed)
   [facility] Pre-operative Risk Assessment     Request for surgical clearance:  1. What type of surgery is being performed? Ex lap, incisional hernia repair with mesh   2. When is this surgery scheduled? TBD   3. What type of clearance is required (medical clearance vs. Pharmacy clearance to hold med vs. Both)? medical  4. Are there any medications that need to be held prior to surgery and how long? N/A   5. Practice name and name of physician performing surgery? [facility]   6. What is the office phone number? [phone]   7.   What is the office fax number? [phone] attn: Diane RN  8.   Anesthesia type (None, local, MAC, general) ? general   Louis [name] A Louis [name] 09/30/2019, 8:27 AM  _________________________________________________________________

## 2019-09-30 NOTE — Telephone Encounter (Signed)
Louis [name], what do I need to do? Call this number? I thought you just needed an order.

## 2019-09-30 NOTE — Therapy (Signed)
[facility] [address] [place], [place], [address] Phone: [phone]   Fax:  [phone]  Physical Therapy Treatment  Patient Details  Name: Louis [name] MRN: [id number] Date of Birth: [birth year] Referring Provider (PT): Marlowe Shores, PA-C Grant Fontana, MD)   Encounter Date: 09/30/2019   PT End of Session - 09/30/19 0938    Visit Number 19    Number of Visits 20   per recert 7/67/2094   Authorization Type [employer], covered 100% as ded and OOPM met; VL:  30    PT Start Time 0933    PT Stop Time 1012    PT Time Calculation (min) 39 min    Equipment Utilized During Treatment Gait belt    Activity Tolerance Patient tolerated treatment well    Behavior During Therapy WFL for tasks assessed/performed           Past Medical History:  Diagnosis Date  . Anxiety    situational  . BPH (benign prostatic hyperplasia)    on flomax  . Bundle branch block    per prior PCP records  . CHF (congestive heart failure) ([place])   . COPD (chronic obstructive pulmonary disease) ([place])   . Diverticulosis    by colonoscopy  . Eczema    per prior PCP records  . Elevated PSA 2015   peaked 6s, s/p benign biopsy 2014, sees urology [name] ([place])  . Essential tremor   . GERD (gastroesophageal reflux disease)    per prior PCP records  . History of panic attacks    per prior PCP records  . HTN (hypertension)   . Mild intermittent asthma in adult without complication   . Obesity, Class I, BMI 30-34.9   . Osteoporosis    DEXA 06/2013 with osteopenia - h/o ?wrist/hip fracture from AVN from chronic steroid use (asthma), took reclast for 1 year  . Renal artery stenosis in 1 of 2 vessels ([place]) 2011   by [place]  . Seasonal allergies   . Sleep apnea   . Thoracic scoliosis childhood  . Transaminitis    per prior PCP records    Past Surgical History:  Procedure Laterality Date  . APPLICATION OF WOUND VAC Left 05/09/2018    Procedure: Application Of Wound Vac;  Surgeon: Newt Minion, MD;  Location: [facility];  Service: Orthopedics;  Laterality: Left;  . BIOPSY  04/30/2019   Procedure: BIOPSY;  Surgeon: Juanita Craver, MD;  Location: [facility];  Service: Endoscopy;;  . COLONOSCOPY  12/2013   polyps, int hem, diverticulosis, rpt 5 yrs ([place])  . COLONOSCOPY WITH PROPOFOL N/A 04/30/2019   Procedure: COLONOSCOPY WITH PROPOFOL;  Surgeon: Juanita Craver, MD;  Location: [facility];  Service: Endoscopy;  Laterality: N/A;  . ELBOW SURGERY Right 2008   golfer's elbow  . ESOPHAGOGASTRODUODENOSCOPY (EGD) WITH PROPOFOL N/A 04/30/2019   Procedure: ESOPHAGOGASTRODUODENOSCOPY (EGD) WITH PROPOFOL;  Surgeon: Juanita Craver, MD;  Location: [facility];  Service: Endoscopy;  Laterality: N/A;  . INGUINAL HERNIA REPAIR Bilateral childhood & ~1995  . LAPAROTOMY N/A 01/05/2019   Procedure: EXPLORATORY LAPAROTOMY graham patch repair;  Surgeon: Benjamine Sprague, DO;  Location: [facility];  Service: General;  Laterality: N/A;  . NASAL SEPTUM SURGERY  2013   deviated septum  . ORIF ANKLE FRACTURE Left 05/09/2018   Procedure: OPEN REDUCTION INTERNAL FIXATION LEFT LISFRANC FRACTURE/DISLOCATION;  Surgeon: Newt Minion, MD;  Location: [facility];  Service: Orthopedics;  Laterality: Left;  . POLYPECTOMY  04/30/2019   Procedure:  POLYPECTOMY;  Surgeon: Juanita Craver, MD;  Location: [facility];  Service: Endoscopy;;  . TEE WITHOUT CARDIOVERSION N/A 06/29/2019   Procedure: TRANSESOPHAGEAL ECHOCARDIOGRAM (TEE);  Surgeon: Acie Fredrickson Wonda Cheng, MD;  Location: [facility];  Service: Cardiovascular;  Laterality: N/A;  . US ECHOCARDIOGRAPHY  02/2009   WNL, EF >55% Claiborne Billings)  . US RENAL/AORTA Right 05/2009   1-59% diameter reduction renal artery, rec rpt 2 yrs    There were no vitals filed for this visit.   Subjective Assessment - 09/30/19 0937    Subjective No new complaitnts. No pain or falls to report.    Pertinent History L foot fracture, sepsis with septic shock, acute  renal railure, OSA, GERD, anxiety, HTN, restrictive lung disease with scoliosis, neuropathy    Patient Stated Goals Pt's goal for therapy are for strengthening and endurance.    Currently in Pain? No/denies    Pain Score 0-No pain              [facility] PT Assessment - 09/30/19 0939      6 Minute Walk- Baseline   6 Minute Walk- Baseline yes    BP (mmHg) 123/63    HR (bpm) 70    02 Sat (%RA) 100 %    Modified Borg Scale for Dyspnea 0- Nothing at all    Perceived Rate of Exertion (Borg) 6-      6 Minute walk- Post Test   6 Minute Walk Post Test yes    BP (mmHg) 170/73    HR (bpm) 84    02 Sat (%RA) 100 %    Modified Borg Scale for Dyspnea 0- Nothing at all    Perceived Rate of Exertion (Borg) 7- Very, very light      6 minute walk test results    Aerobic Endurance Distance Walked 1701    Endurance additional comments no AD      Functional Gait  Assessment   Gait assessed  Yes    Gait Level Surface Walks 20 ft in less than 5.5 sec, no assistive devices, good speed, no evidence for imbalance, normal gait pattern, deviates no more than 6 in outside of the 12 in walkway width.   5.31 sec's   Change in Gait Speed Able to smoothly change walking speed without loss of balance or gait deviation. Deviate no more than 6 in outside of the 12 in walkway width.    Gait with Horizontal Head Turns Performs head turns smoothly with no change in gait. Deviates no more than 6 in outside 12 in walkway width    Gait with Vertical Head Turns Performs head turns with no change in gait. Deviates no more than 6 in outside 12 in walkway width.    Gait and Pivot Turn Pivot turns safely within 3 sec and stops quickly with no loss of balance.    Step Over Obstacle Is able to step over one shoe box (4.5 in total height) without changing gait speed. No evidence of imbalance.    Gait with Narrow Base of Support Ambulates 7-9 steps.    Gait with Eyes Closed Walks 20 ft, uses assistive device, slower speed, mild  gait deviations, deviates 6-10 in outside 12 in walkway width. Ambulates 20 ft in less than 9 sec but greater than 7 sec.   8.90 sec's with veering to left   Ambulating Backwards Walks 20 ft, no assistive devices, good speed, no evidence for imbalance, normal gait   10 sec's   Steps Alternating feet,  no rail.    Total Score 27    FGA comment: 25-28= low fall risk                 [facility] Adult PT Treatment/Exercise - 09/30/19 0939      Transfers   Transfers Sit to Stand;Stand to Sit    Sit to Stand 6: Modified independent (Device/Increase time);Without upper extremity assist;From bed    Stand to Sit 6: Modified independent (Device/Increase time);Without upper extremity assist;To bed      Ambulation/Gait   Ambulation/Gait Yes    Ambulation/Gait Assistance 6: Modified independent (Device/Increase time)    Ambulation/Gait Assistance Details around gym with session.     Assistive device None    Gait Pattern Step-through pattern;Wide base of support    Ambulation Surface Level;Indoor    Stairs Yes    Stairs Assistance 6: Modified independent (Device/Increase time)    Stairs Assistance Details (indicate cue type and reason) pt able to ascend reciprocally/descend step to pattern while carrying fully loaded tray in both hands Mod I.                   Stair Management Technique No rails;Alternating pattern;Step to pattern;Forwards    Number of Stairs 4               PT Long Term Goals - 09/30/19 3716      PT LONG TERM GOAL #1   Title Pt will be independent with progression of HEP for improved strength, balance, transfers, and gait.  TARGET 10/02/2019    Time 5    Period Weeks    Status On-going      PT LONG TERM GOAL #2   Title Pt will improve FGA score to at least 25/30 for decreased fall risk with improved dynamic gait.    Baseline 09/30/19: 27/30 scored today    Time --    Period --    Status Achieved      PT LONG TERM GOAL #3   Title Pt will improve 6 MWT score to at  least 1400 ft for improved gait efficiency and endurance.    Baseline 09/30/19: 1701 feet today no AD    Time --    Period --    Status Achieved      PT LONG TERM GOAL #4   Title Pt will negotiate at least 4 steps carrying object in both hands, modified independently, for improved stair negotiation in and out of home.    Baseline 09/30/19: met today in session    Time --    Period --    Status Achieved      PT LONG TERM GOAL #5   Title Pt will report ambulating in his neighborhood at least 20 minutes using walking pole only, for improved gait independence.    Baseline 09/30/19: pt reports walking for up to 30 minutes with walking pole, longer if he is walking the dog.    Status Achieved                 Plan - 09/30/19 0938    Clinical Impression Statement Today's skilled session focused on progress toward LTGs with goals check today met. Will plan to check remaining goal at next session.    Personal Factors and Comorbidities Comorbidity 3+    Comorbidities history of familial tremors, restrictive lung disease with scoliosis, perforated duodenal ulcer, hepatic steatosis, moderate to excessive alcohol use, left Lisfranc fracture with dislocations s/p ORIF with onset of  neuropathy, B12 deficiency, gait disorder with history of falls; R 5th metatarsal fx s/p fall 06/2019    Examination-Activity Limitations Locomotion Level;Transfers;Stand;Stairs    Examination-Participation Restrictions Church;Community Activity;Other   Fly fishing, golf, pilates   Stability/Clinical Decision Making Evolving/Moderate complexity    Rehab Potential Good    PT Frequency 2x / week    PT Duration Other (comment)   5 weeks, per recert 5/42/7062   PT Treatment/Interventions ADLs/Self Care Home Management;Gait training;Stair training;Functional mobility training;Therapeutic activities;Therapeutic exercise;Balance training;Neuromuscular re-education;Patient/family education    PT Next Visit Plan check final  goal- update/finalize HEP    Consulted and Agree with Plan of Care Patient           Patient will benefit from skilled therapeutic intervention in order to improve the following deficits and impairments:  Abnormal gait, Difficulty walking, Decreased endurance, Decreased activity tolerance, Decreased balance, Decreased mobility, Decreased strength, Postural dysfunction  Visit Diagnosis: Unsteadiness on feet  Other abnormalities of gait and mobility  Muscle weakness (generalized)     Problem List Patient Active Problem List   Diagnosis Date Noted  . Neuropathy 07/20/2019  . Hypomagnesemia   . Macrocytic anemia   . Bacteremia   . Debility 07/07/2019  . Acute respiratory failure with hypoxia ([place])   . Atelectasis   . Enterococcal bacteremia 06/26/2019  . AKI (acute kidney injury) ([place]) 06/25/2019  . Perforated viscus 01/06/2019  . Severe sepsis with septic shock ([place]) 01/06/2019  . Acute renal failure ([place]) 01/06/2019  . Hyperkalemia 01/06/2019  . Hyponatremia 01/06/2019  . Lactic acidosis 01/06/2019  . Onychomycosis 05/19/2018  . Foot fracture, left, closed, initial encounter 05/09/2018  . Lisfranc dislocation, left, initial encounter   . Acute bronchitis 02/25/2018  . Primary pulmonary hypertension ([place]) 07/09/2017  . Diastolic dysfunction 37/62/8315  . OSA (obstructive sleep apnea) 07/08/2017  . SOB (shortness of breath) 07/02/2017  . Chronic respiratory failure with hypoxia and hypercapnia ([place]) 07/02/2017  . Cardiomegaly 07/02/2017  . Exposure keratitis 02/17/2017  . Pedal edema 12/18/2016  . Transaminitis 12/18/2016  . Bilateral pes planus 12/18/2016  . Psoriasis-eczema overlap condition 07/30/2016  . High serum high density lipoprotein (HDL) 09/26/2015  . Epistaxis 09/26/2015  . Renal artery stenosis in 1 of 2 vessels ([place])   . Essential tremor   . Elevated PSA   . GERD (gastroesophageal reflux disease)   . Health maintenance examination 10/08/2014  .  Anxiety state 10/08/2014  . Obesity, Class I, BMI 30-34.9   . Mild intermittent asthma in adult without complication   . Seasonal allergies   . HTN (hypertension)   . Scoliosis   . Osteoporosis     Willow Ora, PTA, [facility] [address], [place],  [address] [phone] 09/30/19, 11:15 PM   Name: Louis [name] MRN: [id number] Date of Birth: 10-24-1955

## 2019-09-30 NOTE — Telephone Encounter (Signed)
Please send order. Thanks so much

## 2019-09-30 NOTE — Telephone Encounter (Signed)
Order has been placed. Nothing further needed. 

## 2019-09-30 NOTE — Telephone Encounter (Signed)
No need to call just please advise if this is okay to send order for inogen system for him to travel with, thanks!

## 2019-09-30 NOTE — Addendum Note (Signed)
Addended by: Lorretta Harp on: 09/30/2019 05:13 PM   Modules accepted: Orders

## 2019-09-30 NOTE — Telephone Encounter (Signed)
I am covering for Tom this week. I think we can clear him from a CV standpoint for his procedure without further testing  Yochanan Eddleman [place] MD, [facility]

## 2019-09-30 NOTE — Telephone Encounter (Signed)
Spoke with [place] regarding prior message. Patient will need a machine possible inogen that he can fly with for his upcoming trip.Betty's fax Number is [phone] case#[id number].  [place] Roch can you please advise.  Thank you

## 2019-09-30 NOTE — Telephone Encounter (Signed)
   Primary Cardiologist: Shelva Majestic, MD  Chart reviewed as part of pre-operative protocol coverage. Patient was contacted 09/30/2019 in reference to pre-operative risk assessment for pending surgery as outlined below.  Louis [name] was last seen on 07/29/19 by Dr. Claiborne Billings. He has history of HTN, scoliosis, mild renal artery stenosis, perforated duodenal ulcer s/p surgery, bacteremia with unremarkable TEE 06/2019, mild-moderate mitral regurgitation, respiratory hypercarbic arrest in context of severe OSA (not placed on CPAP the night prior), anxiety, BPH, COPD, GERD, diverticulosis, asthma, osteoporosis, seasonal allergies, transaminitis per records.  Patient is husband of OBGYN Dr. Margaretha Glassing.   I reached out to patient who affirms he has been doing well in his recovery since last hospitalization, doing physical activity about 4 hours per week in the form of pilates, walking and physical therapy to work on his balance. He has not had any anginal symptoms with this or new cardiac symptoms.  The patient also reports clearance has also been sent to his pulmonologist who manages his sleep apnea. He does not take any blood thinners.  Since procedure is under general anesthesia and patient otherwise had not had any prior ischemic assessment will route to Dr. Claiborne Billings for his input on clearing based on asymptomatic nature without further testing. Dr. Claiborne Billings - Please route response to P CV DIV PREOP (the pre-op pool). Thank you.   [name], PA-C 09/30/2019, 11:29 AM

## 2019-09-30 NOTE — Telephone Encounter (Signed)
   Primary Cardiologist: Shelva Majestic, MD  Chart revisited as part of pre-operative protocol coverage. Reviewed medical history and clinical stability with Dr. [place] who indicates the patient can be cleared for his surgery without further cardiac testing. The patient was advised that if he develops new symptoms prior to surgery to contact our office to arrange for a follow-up visit, and he verbalized understanding.  Will route to callback to let patient know.  I will route this recommendation to the requesting party via Epic fax function and remove from pre-op pool.  Please call with questions.  [name], PA-C 09/30/2019, 3:25 PM

## 2019-09-30 NOTE — Telephone Encounter (Signed)
Pt has been contacted and made aware that his surgery clearance has been granted and to call us and arrange a office visit if he develops any new symptoms prior to surgery.  Pt was grateful for the calla and verbalized understanding.

## 2019-09-30 NOTE — Telephone Encounter (Signed)
ok to write the order for the travel oxygen concentrator. Thanks

## 2019-10-02 ENCOUNTER — Other Ambulatory Visit: Payer: Self-pay

## 2019-10-02 ENCOUNTER — Ambulatory Visit: Payer: 59 | Attending: Physician Assistant | Admitting: Physical Therapy

## 2019-10-02 ENCOUNTER — Ambulatory Visit: Payer: 59 | Admitting: Neurology

## 2019-10-02 DIAGNOSIS — R2689 Other abnormalities of gait and mobility: Secondary | ICD-10-CM | POA: Diagnosis present

## 2019-10-02 DIAGNOSIS — R2681 Unsteadiness on feet: Secondary | ICD-10-CM | POA: Insufficient documentation

## 2019-10-02 NOTE — Therapy (Signed)
[facility] [address] [place], [place], [address] Phone: [phone]   Fax:  [phone]  Physical Therapy Treatment/Discharge Summary  Patient Details  Name: Louis [name] MRN: [id number] Date of Birth: 12-15-1955 Referring Provider (PT): Marlowe Shores, PA-C Grant Fontana, MD)   Encounter Date: 10/02/2019   PT End of Session - 10/02/19 1506    Visit Number 20    Number of Visits 20   per recert 2/80/0349   Authorization Type [employer], covered 100% as ded and OOPM met; VL:  23    PT Start Time 0939   PT running late from previous patient   PT Stop Time 1003   d/c visit; goals check and session ended   PT Time Calculation (min) 24 min    Activity Tolerance Patient tolerated treatment well    Behavior During Therapy [facility] for tasks assessed/performed           Past Medical History:  Diagnosis Date   Anxiety    situational   BPH (benign prostatic hyperplasia)    on flomax   Bundle branch block    per prior PCP records   CHF (congestive heart failure) ([place])    COPD (chronic obstructive pulmonary disease) ([place])    Diverticulosis    by colonoscopy   Eczema    per prior PCP records   Elevated PSA 2015   peaked 6s, s/p benign biopsy 2014, sees urology [name]   Essential tremor    GERD (gastroesophageal reflux disease)    per prior PCP records   History of panic attacks    per prior PCP records   HTN (hypertension)    Mild intermittent asthma in adult without complication    Obesity, Class I, BMI 30-34.9    Osteoporosis    DEXA 06/2013 with osteopenia - h/o ?wrist/hip fracture from AVN from chronic steroid use (asthma), took reclast for 1 year   Renal artery stenosis in 1 of 2 vessels ([place]) 2011   by [place]   Seasonal allergies    Sleep apnea    Thoracic scoliosis childhood   Transaminitis    per prior PCP records    Past Surgical History:  Procedure Laterality Date    APPLICATION OF WOUND VAC Left 05/09/2018   Procedure: Application Of Wound Vac;  Surgeon: Newt Minion, MD;  Location: [facility];  Service: Orthopedics;  Laterality: Left;   BIOPSY  04/30/2019   Procedure: BIOPSY;  Surgeon: Juanita Craver, MD;  Location: [facility];  Service: Endoscopy;;   COLONOSCOPY  12/2013   polyps, int hem, diverticulosis, rpt 5 yrs [name])   COLONOSCOPY WITH PROPOFOL N/A 04/30/2019   Procedure: COLONOSCOPY WITH PROPOFOL;  Surgeon: Juanita Craver, MD;  Location: [facility];  Service: Endoscopy;  Laterality: N/A;   ELBOW SURGERY Right 2008   golfer's elbow   ESOPHAGOGASTRODUODENOSCOPY (EGD) WITH PROPOFOL N/A 04/30/2019   Procedure: ESOPHAGOGASTRODUODENOSCOPY (EGD) WITH PROPOFOL;  Surgeon: Juanita Craver, MD;  Location: [facility];  Service: Endoscopy;  Laterality: N/A;   INGUINAL HERNIA REPAIR Bilateral childhood & ~1995   LAPAROTOMY N/A 01/05/2019   Procedure: EXPLORATORY LAPAROTOMY graham patch repair;  Surgeon: Benjamine Sprague, DO;  Location: [facility];  Service: General;  Laterality: N/A;   NASAL SEPTUM SURGERY  2013   deviated septum   ORIF ANKLE FRACTURE Left 05/09/2018   Procedure: OPEN REDUCTION INTERNAL FIXATION LEFT LISFRANC FRACTURE/DISLOCATION;  Surgeon: Newt Minion, MD;  Location: [facility];  Service: Orthopedics;  Laterality: Left;  POLYPECTOMY  04/30/2019   Procedure: POLYPECTOMY;  Surgeon: Juanita Craver, MD;  Location: [facility];  Service: Endoscopy;;   TEE WITHOUT CARDIOVERSION N/A 06/29/2019   Procedure: TRANSESOPHAGEAL ECHOCARDIOGRAM (TEE);  Surgeon: Acie Fredrickson Wonda Cheng, MD;  Location: [facility];  Service: Cardiovascular;  Laterality: N/A;   US ECHOCARDIOGRAPHY  02/2009   WNL, EF >55% Claiborne Billings)   US RENAL/AORTA Right 05/2009   1-59% diameter reduction renal artery, rec rpt 2 yrs    There were no vitals filed for this visit.   Subjective Assessment - 10/02/19 0940    Subjective Feeling good, feeling steadier on stairs.  No falls    Pertinent History L  foot fracture, sepsis with septic shock, acute renal railure, OSA, GERD, anxiety, HTN, restrictive lung disease with scoliosis, neuropathy    Patient Stated Goals Pt's goal for therapy are for strengthening and endurance.    Currently in Pain? No/denies                Sit to Stand with Arms Crossed - 1 x daily - 5 x weekly - 2-3 sets - 10 reps  Discussed ways to progress:  Performing from lower surfaces, performing sit<>Stand while standing on foam Heel Toe Raises with Counter Support - 1 x daily - 5 x weekly - 3 sets - 10 reps - 3 sec hold  Cues to hold 3 seconds each direction Tandem Walking with Counter Support - 1 x daily - 5 x weekly - 1 sets - 3 reps  Progressed to tandem marching (pt performed forward marching and d/c from program due to too easy) Standing Near Stance in Corner with Eyes Closed - 1 x daily - 5 x weekly - 1 sets - 3 reps - 30 hold Standing Balance in Corner with Eyes Closed - 1 x daily - 5 x weekly - 1 sets - 10 reps Tandem Stance with Eyes Closed in Corner - 1 x daily - 5 x weekly - 1 sets - 3 reps - 30 hold Romberg Stance with Head Nods - 1 x daily - 5 x weekly - 2 sets - 10 reps  Reviewed and performed above corner exercises-pt is already performing at home standing on foam  Alternating Step Taps with Counter Support - 1 x daily - 5 x weekly - 2-3 sets - 10 reps Forward Step Up - 1 x daily - 5 x weekly - 2-3 sets - 10 reps    Pt return demo understanding of HEP and ways to progress                     PT Education - 10/02/19 1505    Education Details Progressions of HEP, plans for d/c this visit    Person(s) Educated Patient    Methods Explanation;Demonstration;Verbal cues;Handout   Provided updated handout of HEP   Comprehension Verbalized understanding;Returned demonstration               PT Long Term Goals - 10/02/19 1508      PT LONG TERM GOAL #1   Title Pt will be independent with progression of HEP for improved strength,  balance, transfers, and gait.  TARGET 10/02/2019    Time 5    Period Weeks    Status Achieved      PT LONG TERM GOAL #2   Title Pt will improve FGA score to at least 25/30 for decreased fall risk with improved dynamic gait.    Baseline 09/30/19: 27/30 scored today  Status Achieved      PT LONG TERM GOAL #3   Title Pt will improve 6 MWT score to at least 1400 ft for improved gait efficiency and endurance.    Baseline 09/30/19: 1701 feet today no AD    Status Achieved      PT LONG TERM GOAL #4   Title Pt will negotiate at least 4 steps carrying object in both hands, modified independently, for improved stair negotiation in and out of home.    Baseline 09/30/19: met today in session    Status Achieved      PT LONG TERM GOAL #5   Title Pt will report ambulating in his neighborhood at least 20 minutes using walking pole only, for improved gait independence.    Baseline 09/30/19: pt reports walking for up to 30 minutes with walking pole, longer if he is walking the dog.    Status Achieved                 Plan - 10/02/19 1507    Clinical Impression Statement Assessed LTG for HEP, with pt meeting that LTG.  Discussed and demonstrated ways to progress HEP.  Pt has demonstrated overall improved balance, gait, and functional strength throughout therapy.  He is appropriate for d/c this visit.    Personal Factors and Comorbidities Comorbidity 3+    Comorbidities history of familial tremors, restrictive lung disease with scoliosis, perforated duodenal ulcer, hepatic steatosis, moderate to excessive alcohol use, left Lisfranc fracture with dislocations s/p ORIF with onset of neuropathy, B12 deficiency, gait disorder with history of falls; R 5th metatarsal fx s/p fall 06/2019    Examination-Activity Limitations Locomotion Level;Transfers;Stand;Stairs    Examination-Participation Restrictions Church;Community Activity;Other   Fly fishing, golf, pilates   Stability/Clinical Decision Making  Evolving/Moderate complexity    Rehab Potential Good    PT Frequency 2x / week    PT Duration Other (comment)   5 weeks, per recert 3/81/8299   PT Treatment/Interventions ADLs/Self Care Home Management;Gait training;Stair training;Functional mobility training;Therapeutic activities;Therapeutic exercise;Balance training;Neuromuscular re-education;Patient/family education    PT Next Visit Plan D/C this visit    Consulted and Agree with Plan of Care Patient           Patient will benefit from skilled therapeutic intervention in order to improve the following deficits and impairments:  Abnormal gait, Difficulty walking, Decreased endurance, Decreased activity tolerance, Decreased balance, Decreased mobility, Decreased strength, Postural dysfunction  Visit Diagnosis: Unsteadiness on feet  Other abnormalities of gait and mobility     Problem List Patient Active Problem List   Diagnosis Date Noted   Neuropathy 07/20/2019   Hypomagnesemia    Macrocytic anemia    Bacteremia    Debility 07/07/2019   Acute respiratory failure with hypoxia (HCC)    Atelectasis    Enterococcal bacteremia 06/26/2019   AKI (acute kidney injury) ([place]) 06/25/2019   Perforated viscus 01/06/2019   Severe sepsis with septic shock ([place]) 01/06/2019   Acute renal failure ([place]) 01/06/2019   Hyperkalemia 01/06/2019   Hyponatremia 01/06/2019   Lactic acidosis 01/06/2019   Onychomycosis 05/19/2018   Foot fracture, left, closed, initial encounter 05/09/2018   Lisfranc dislocation, left, initial encounter    Acute bronchitis 02/25/2018   Primary pulmonary hypertension ([place]) 37/16/9678   Diastolic dysfunction 93/81/0175   OSA (obstructive sleep apnea) 07/08/2017   SOB (shortness of breath) 07/02/2017   Chronic respiratory failure with hypoxia and hypercapnia ([place]) 07/02/2017   Cardiomegaly 07/02/2017   Exposure keratitis 02/17/2017  Pedal edema 12/18/2016   Transaminitis 12/18/2016    Bilateral pes planus 12/18/2016   Psoriasis-eczema overlap condition 07/30/2016   High serum high density lipoprotein (HDL) 09/26/2015   Epistaxis 09/26/2015   Renal artery stenosis in 1 of 2 vessels (HCC)    Essential tremor    Elevated PSA    GERD (gastroesophageal reflux disease)    Health maintenance examination 10/08/2014   Anxiety state 10/08/2014   Obesity, Class I, BMI 30-34.9    Mild intermittent asthma in adult without complication    Seasonal allergies    HTN (hypertension)    Scoliosis    Osteoporosis     Elnore Cosens W. 10/02/2019, 3:09 PM  Frazier Butt., PT   [facility] [address] [place], [place], [address] Phone: [phone]   Fax:  [phone]  Name: Louis [name] MRN: [id number] Date of Birth: February 25, 1955   PHYSICAL THERAPY DISCHARGE SUMMARY  Visits from Start of Care: 20  Current functional level related to goals / functional outcomes:  PT Long Term Goals - 10/02/19 1508      PT LONG TERM GOAL #1   Title Pt will be independent with progression of HEP for improved strength, balance, transfers, and gait.  TARGET 10/02/2019    Time 5    Period Weeks    Status Achieved      PT LONG TERM GOAL #2   Title Pt will improve FGA score to at least 25/30 for decreased fall risk with improved dynamic gait.    Baseline 09/30/19: 27/30 scored today    Status Achieved      PT LONG TERM GOAL #3   Title Pt will improve 6 MWT score to at least 1400 ft for improved gait efficiency and endurance.    Baseline 09/30/19: 1701 feet today no AD    Status Achieved      PT LONG TERM GOAL #4   Title Pt will negotiate at least 4 steps carrying object in both hands, modified independently, for improved stair negotiation in and out of home.    Baseline 09/30/19: met today in session    Status Achieved      PT LONG TERM GOAL #5   Title Pt will report ambulating in his neighborhood at least  20 minutes using walking pole only, for improved gait independence.    Baseline 09/30/19: pt reports walking for up to 30 minutes with walking pole, longer if he is walking the dog.    Status Achieved          Pt has met all LTGs.   Remaining deficits: High level balance issues   Education / Equipment: HEP, fall prevention education, progression of HEP  Plan: Patient agrees to discharge.  Patient goals were met. Patient is being discharged due to meeting the stated rehab goals.  ?????      Mady Haagensen, PT 10/02/19 3:13 PM Phone: [phone] Fax: [phone]

## 2019-10-02 NOTE — Patient Instructions (Signed)
Access Code: [email or web address] URL: [email or web address] Date: 10/02/2019 Prepared by: Mady Haagensen  Exercises Sit to Stand with Arms Crossed - 1 x daily - 5 x weekly - 2-3 sets - 10 reps Heel Toe Raises with Counter Support - 1 x daily - 5 x weekly - 3 sets - 10 reps - 3 sec hold Tandem Walking with Counter Support - 1 x daily - 5 x weekly - 1 sets - 3 reps Standing Near Stance in Corner with Eyes Closed - 1 x daily - 5 x weekly - 1 sets - 3 reps - 30 hold Standing Balance in Corner with Eyes Closed - 1 x daily - 5 x weekly - 1 sets - 10 reps Tandem Stance with Eyes Closed in Corner - 1 x daily - 5 x weekly - 1 sets - 3 reps - 30 hold Romberg Stance with Head Nods - 1 x daily - 5 x weekly - 2 sets - 10 reps Alternating Step Taps with Counter Support - 1 x daily - 5 x weekly - 2-3 sets - 10 reps Forward Step Up - 1 x daily - 5 x weekly - 2-3 sets - 10 reps

## 2019-10-07 ENCOUNTER — Other Ambulatory Visit: Payer: Self-pay

## 2019-10-07 ENCOUNTER — Encounter: Payer: Self-pay | Admitting: Acute Care

## 2019-10-07 ENCOUNTER — Ambulatory Visit (INDEPENDENT_AMBULATORY_CARE_PROVIDER_SITE_OTHER): Payer: 59 | Admitting: Acute Care

## 2019-10-07 VITALS — BP 120/72 | HR 62 | Temp 97.4°F | Ht 71.0 in | Wt 199.2 lb

## 2019-10-07 DIAGNOSIS — G4733 Obstructive sleep apnea (adult) (pediatric): Secondary | ICD-10-CM | POA: Diagnosis not present

## 2019-10-07 DIAGNOSIS — Z01811 Encounter for preprocedural respiratory examination: Secondary | ICD-10-CM | POA: Diagnosis not present

## 2019-10-07 DIAGNOSIS — Z9989 Dependence on other enabling machines and devices: Secondary | ICD-10-CM | POA: Diagnosis not present

## 2019-10-07 NOTE — Patient Instructions (Addendum)
It is good to see you today.  We will print  a prescription for a full face mask for your CPAP. We will increase pressure to 15 cm H2O If this is uncomfortable, let us know and we will reset this to 13 cm H2O and consider doing an auto titration  We will clear you for your hernia surgery Ask your surgeon if it is ok to use CPAP immediately after surgery. If it is ok, please take your CPAP from home and use that.  Pressure is 15 cm H2O. Will decrease as needed  Follow up in 4-6 weeks with down Load. Call if you need [place] sooner Please contact office for sooner follow up if symptoms do not improve or worsen or seek emergency care

## 2019-10-07 NOTE — Progress Notes (Signed)
[id number] - [place], [address] AT [address] [place] [address] Phone: [phone] Fax: [phone]      Your procedure is scheduled on 10/15/19.  Report to [facility] Main Entrance "A" at 7:00 A.M., and check in at the Admitting office.  Call this number if you have problems the morning of surgery:  [phone]  Call [phone] if you have any questions prior to your surgery date Monday-Friday 8am-4pm    Remember:  Do not eat after midnight the night before your surgery  You may drink clear liquids until 6:00 the morning of your surgery.   Clear liquids allowed are: Water, Non-Citrus Juices (without pulp), Carbonated Beverages, Clear Tea, Black Coffee Only, and Gatorade  Please complete your  (2) PRE-SURGERY ENSURE  Drinks that were provided to you by 5:00 pm the evening before surgery.  Please, if able, drink it in one setting. DO NOT SIP.  Please complete your PRE-SURGERY ENSURE that was provided to you by 6:00 the morning of surgery.  Please, if able, drink it in one setting. DO NOT SIP.    Take these medicines the morning of surgery with A SIP OF WATER: amLODipine (NORVASC) gabapentin (NEURONTIN) metoprolol succinate (TOPROL-XL)  omeprazole (PRILOSEC) ALPRAZolam (XANAX) - as needed  As of today, STOP taking any Aspirin (unless otherwise instructed by your surgeon) Aleve, Naproxen, Ibuprofen, Motrin, Advil, Goody's, BC's, all herbal medications, fish oil, and all vitamins.                      Do not wear jewelry            Do not wear lotions, powders, colognes, or deodorant.            Do not shave 48 hours prior to surgery.  Men may shave face and neck.            Do not bring valuables to the hospital.            [facility] is not responsible for any belongings or valuables.  Do NOT Smoke (Tobacco/Vaping) or drink Alcohol 24 hours prior to your procedure If you use a CPAP at  night, you may bring all equipment for your overnight stay.   Contacts, glasses, dentures or bridgework may not be worn into surgery.      For patients admitted to the hospital, discharge time will be determined by your treatment team.   Patients discharged the day of surgery will not be allowed to drive home, and someone needs to stay with them for 24 hours.    Special instructions:   [facility] [place]- Preparing For Surgery  Before surgery, you can play an important role. Because skin is not sterile, your skin needs to be as free of germs as possible. You can reduce the number of germs on your skin by washing with CHG (chlorahexidine gluconate) Soap before surgery.  CHG is an antiseptic cleaner which kills germs and bonds with the skin to continue killing germs even after washing.    Oral Hygiene is also important to reduce your risk of infection.  Remember - BRUSH YOUR TEETH THE MORNING OF SURGERY WITH YOUR REGULAR TOOTHPASTE  Please do not use if you have an allergy to CHG or antibacterial soaps. If your skin becomes reddened/irritated stop using the CHG.  Do not shave (including legs and underarms) for at least 48 hours prior to first CHG  shower. It is OK to shave your face.  Please follow these instructions carefully.   1. Shower the NIGHT BEFORE SURGERY and the MORNING OF SURGERY with CHG Soap.   2. If you chose to wash your hair, wash your hair first as usual with your normal shampoo.  3. After you shampoo, rinse your hair and body thoroughly to remove the shampoo.  4. Use CHG as you would any other liquid soap. You can apply CHG directly to the skin and wash gently with a scrungie or a clean washcloth.   5. Apply the CHG Soap to your body ONLY FROM THE NECK DOWN.  Do not use on open wounds or open sores. Avoid contact with your eyes, ears, mouth and genitals (private parts). Wash Face and genitals (private parts)  with your normal soap.   6. Wash thoroughly, paying special  attention to the area where your surgery will be performed.  7. Thoroughly rinse your body with warm water from the neck down.  8. DO NOT shower/wash with your normal soap after using and rinsing off the CHG Soap.  9. Pat yourself dry with a CLEAN TOWEL.  10. Wear CLEAN PAJAMAS to bed the night before surgery  11. Place CLEAN SHEETS on your bed the night of your first shower and DO NOT SLEEP WITH PETS.   Day of Surgery: Wear Clean/Comfortable clothing the morning of surgery Do not apply any deodorants/lotions.   Remember to brush your teeth WITH YOUR REGULAR TOOTHPASTE.   Please read over the following fact sheets that you were given.

## 2019-10-07 NOTE — Progress Notes (Addendum)
History of Present Illness Louis [name] is a 64 y.o. male with OSA on CPAP. He is followed by Dr. Karle Plumber Smithis a 64 y.o.malewith PMH significant for HTN, CHF, COPD, sleep apneaon CPAP,and scoliosis with recent hospitalization 06/25/2019-7/17/2021with sepsis 2/2Enterococcusin his blood and urine. Additionally he required intubation 6/30-7/1 for hypercarbic respiratory failure/ HAP prior to admission to inpatient rehab 7/6-7/17. The patient was treated by Dr. Lake Bells and Dr. Ander Slade as an inpatient.He is followed by [name] for his OSA as an outpatient.   10/08/2019 Follow up Pt. Presents for follow up. Down Load was reviewed last month. On set pressure of 11, patient had an AHI of  11.3. Plan was to make adjustment to Auto-CPAP of 5-15 cm, and re-evaluate after down Load on these pressures. Pt. Called the office and requested a change back to a set pressure mode, as he did not like the auto set mode. . We increased his pressure to 13 cm of set pressure from 11 cm of set pressure  he was on previously. Pt. Has identified a leak in his system that he has fixed for the last several days. He does not drink alcohol or take any medications for sleep that could be contributing to his elevated AHI.Over the last 4 days with an increased pressure of 13, his AHI did drop to 9.5. The patient woud like to see this number lower. He has lost a significant amount of weight. We had ordered a fill face mask , but his insurance will not cover this, as he has had a recent mask replacement. He will order this and pay out of pocket.  Pt. Would like to try increase in pressure to 15 cm H2O. If this is too high for him to tolerate, we will return to 13 cm, and we will order a CPAP titration. Pt. denies any daytime sleepiness or am headaches.        Pt. Is also having a hernia repair done 10/15/2019. I received a request from [facility] today to clear the patient at today's appointment for OSA. I  have very little information about the surgery. This is usually handled as a separate visit. See Below  1) RISK FOR PROLONGED MECHANICAL VENTILAION - > 48h  1A) Arozullah - Prolonged mech ventilation risk Arozullah Postperative Pulmonary Risk Score - for mech ventilation dependence >48h [employer], Ann Surg 2000, major non-cardiac surgery) Comment Score  Type of surgery - abd ao aneurysm (27), thoracic (21), neurosurgery / upper abdominal / vascular (21), neck (11) 21 21  Emergency Surgery - (11) 0 0  ALbumin < 3 or poor nutritional state - (9) 3.9 0  BUN > 30 -  (8) 14 ( actual lab) 0  Partial or completely dependent functional status - (7) 0 0  COPD -  (6) 6 6  Age - 60 to 69 (4), > 70  (6) 4 4  TOTAL 31 31  Risk Stratifcation scores  - < 10 (0.5%), 11-19 (1.8%), 20-27 (4.2%), 28-40 (10.1%), >40 (26.6%) 10.1%    Pt. Has a 10.1% chance of pulmonary complications:  Major Pulmonary risks identified in the multifactorial risk analysis are but not limited to a) pneumonia; b) recurrent intubation risk; c) prolonged or recurrent acute respiratory failure needing mechanical ventilation; d) prolonged hospitalization; e) DVT/Pulmonary embolism; f) Acute Pulmonary edema.  Recommend the following post op 1. Short duration of surgery as much as possible and avoid paralytic if possible 2. Recovery in step down or  ICU with Pulmonary consultation 3. DVT prophylaxis 4. Aggressive pulmonary toilet with o2, bronchodilatation, and incentive spirometry and early ambulation  Test Results: June 25, 2021TTE: LVEF 55 to 00%, RV systolic function mildly reduced, aortic dilation, valves normal as visualized June 28 TMA:UQJF normal, RV systolic function normal, mitral and aortic valves normal 6/24 SARS COV 2 > negative 6/24 blood > enterococcus faecalis amp sensitive 6/24 urine culture  6/26 blood > negative  His admission for enterococcal bacteremia -complicated by hypercarbic respiratory failure  which is perplexing given reasonably high FEV1.  ? if other factors such as sedation, narcotics played a role?  NPSG AHI 39/h, >> CPAP 9 cm + 2L o2  Split Night Study 08/2017>> DIAGNOSIS  - Obstructive Sleep Apnea (327.23 [G47.33 ICD-10])  - Nocturnal Hypoxemia (327.26 [G47.36 ICD-10])  RECOMMENDATIONS  - Trial of CPAP therapy on 9 cm H2O with a Medium size  [employer] Full Face Mask Simplus mask and heated  humidification.  - 2L O2 should be blended into CPAP   PFT's 08/15/2017>>suggestive extraparenchymal restriction, He has moderate airway obstruction, FEV1 58%/1.87, F/F ratio of 64 pre BD  CBC Latest Ref Rng & Units 08/03/2019 07/13/2019 07/08/2019  WBC 4.6 - 10.2 K/uL 8.1 4.2 5.9  Hemoglobin 11 - 14.6 g/dL 10.4(A) 9.3(L) 9.5(L)  Hematocrit 29 - 41 % 32.4 29.3(L) 29.3(L)  Platelets 150 - 400 K/uL - 355 438(H)    BMP Latest Ref Rng & Units 07/21/2019 07/13/2019 07/08/2019  Glucose 65 - 99 mg/dL 96 99 99  BUN 8 - 27 mg/dL 12 14 9   Creatinine 0.76 - 1.27 mg/dL 0.88 1.11 0.80  BUN/Creat Ratio 10 - 24 14 - -  Sodium 134 - 144 mmol/L 135 135 135  Potassium 3.5 - 5.2 mmol/L 4.4 4.0 3.9  Chloride 96 - 106 mmol/L 99 100 99  CO2 20 - 29 mmol/L 25 24 27   Calcium 8.6 - 10.2 mg/dL 9.2 9.3 9.4    BNP    Component Value Date/Time   BNP 229.1 (H) 06/25/2019 1217    ProBNP No results found for: PROBNP  PFT    Component Value Date/Time   FEV1PRE 1.87 08/15/2017 1002   FEV1POST 1.97 08/15/2017 1002   FVCPRE 2.90 08/15/2017 1002   FVCPOST 2.79 08/15/2017 1002   TLC 5.72 08/15/2017 1002   DLCOUNC 27.00 08/15/2017 1002   PREFEV1FVCRT 64 08/15/2017 1002   PSTFEV1FVCRT 71 08/15/2017 1002    CT ABDOMEN WO CONTRAST  Result Date: 09/10/2019 CLINICAL DATA:  Incisional hernia. EXAM: CT ABDOMEN WITHOUT CONTRAST TECHNIQUE: Multidetector CT imaging of the abdomen was performed following the standard protocol without IV contrast. COMPARISON:  January 05, 2019. FINDINGS: Lower chest: No acute  abnormality. Hepatobiliary: No focal liver abnormality is seen. No gallstones, gallbladder wall thickening, or biliary dilatation. Pancreas: Unremarkable. No pancreatic ductal dilatation or surrounding inflammatory changes. Spleen: Normal in size without focal abnormality. Adrenals/Urinary Tract: Adrenal glands appear normal. Stable bilateral renal cysts are noted. No renal or ureteral calculi are noted. Moderate right hydronephrosis is noted without obstructing calculus or dilatation of the visualized proximal ureter, suggesting ureteropelvic junction stenosis. Stomach/Bowel: Stomach is within normal limits. Appendix appears normal. No evidence of bowel wall thickening, distention, or inflammatory changes. Vascular/Lymphatic: No significant vascular findings are present. No enlarged abdominal or pelvic lymph nodes. Other: No abdominal wall hernia or abnormality. Musculoskeletal: No acute or significant osseous findings. IMPRESSION: 1. Stable bilateral renal cysts. 2. Moderate right hydronephrosis is noted without obstructing calculus or dilatation of the visualized  proximal ureter, suggesting ureteropelvic junction stenosis. 3. No other significant abnormality seen in the abdomen. Electronically Signed   By: Marijo Conception M.D.   On: 09/10/2019 13:27     Past medical hx Past Medical History:  Diagnosis Date  . Anxiety    situational  . BPH (benign prostatic hyperplasia)    on flomax  . Bundle branch block    per prior PCP records  . CHF (congestive heart failure) ([place])   . COPD (chronic obstructive pulmonary disease) ([place])   . Diverticulosis    by colonoscopy  . Eczema    per prior PCP records  . Elevated PSA 2015   peaked 6s, s/p benign biopsy 2014, sees urology [name] ([place])  . Essential tremor   . GERD (gastroesophageal reflux disease)    per prior PCP records  . History of panic attacks    per prior PCP records  . HTN (hypertension)   . Mild intermittent asthma in adult without  complication   . Obesity, Class I, BMI 30-34.9   . Osteoporosis    DEXA 06/2013 with osteopenia - h/o ?wrist/hip fracture from AVN from chronic steroid use (asthma), took reclast for 1 year  . Renal artery stenosis in 1 of 2 vessels ([place]) 2011   by [place]  . Seasonal allergies   . Sleep apnea   . Thoracic scoliosis childhood  . Transaminitis    per prior PCP records     Social History   Tobacco Use  . Smoking status: Never Smoker  . Smokeless tobacco: Never Used  Vaping Use  . Vaping Use: Never used  Substance Use Topics  . Alcohol use: Not Currently    Alcohol/week: 14.0 standard drinks    Types: 14 Glasses of wine per week    Comment: Social  . Drug use: No    Mr.Dagher reports that he has never smoked. He has never used smokeless tobacco. He reports previous alcohol use of about 14.0 standard drinks of alcohol per week. He reports that he does not use drugs.  Tobacco Cessation: Never Smoker  Past surgical hx, Family hx, Social hx all reviewed.  Current Outpatient Medications on File Prior to Visit  Medication Sig  . ALPRAZolam (XANAX) 0.5 MG tablet Take 1 tablet (0.5 mg total) by mouth daily as needed (essential tremor).  . Amino Acids-Protein Hydrolys (FEEDING SUPPLEMENT, PRO-STAT SUGAR FREE 64,) LIQD Take 30 mLs by mouth 3 (three) times daily with meals. (Patient taking differently: Take 30 mLs by mouth in the morning and at bedtime. )  . amLODipine (NORVASC) 5 MG tablet TAKE 1 TABLET(5 MG) BY MOUTH DAILY (Patient taking differently: Take 5 mg by mouth daily. )  . cyanocobalamin 1000 MCG tablet Take 1 tablet (1,000 mcg total) by mouth daily. Available over the counter (Patient taking differently: Take 1,000 mcg by mouth daily. )  . docusate sodium (COLACE) 100 MG capsule Take 1 capsule (100 mg total) by mouth 2 (two) times daily as needed for mild constipation. Available over the counter (Patient taking differently: Take 100 mg by mouth 2 (two) times daily. )  . folic acid  (FOLVITE) 1 MG tablet Take 1 tablet (1 mg total) by mouth daily.  [name] gabapentin (NEURONTIN) 300 MG capsule Take 1 capsule (300 mg total) by mouth 2 (two) times daily.  [name] losartan (COZAAR) 100 MG tablet Take 1 tablet (100 mg total) by mouth daily.  . magnesium oxide (MAG-OX) 400 (241.3 Mg) MG tablet Take 1 tablet (400  mg total) by mouth with breakfast, with lunch, and with evening meal. (Patient taking differently: Take 400 mg by mouth daily. )  . metoprolol succinate (TOPROL-XL) 50 MG 24 hr tablet Take 1 tablet (50 mg total) by mouth daily. Take with a meal.  . Multiple Vitamin (MULTIVITAMIN WITH MINERALS) TABS tablet Take 1 tablet by mouth daily.  [name] omeprazole (PRILOSEC) 20 MG capsule Take 20 mg by mouth daily.  [name] thiamine 100 MG tablet Take 1 tablet (100 mg total) by mouth daily. Available over the counter (Patient taking differently: Take 100 mg by mouth daily. )   No current facility-administered medications on file prior to visit.     Allergies  Allergen Reactions  . Accupril [Quinapril Hcl] Cough  . Nsaids     Perforated gastric ulcer    Review Of Systems:  Constitutional:   + intentional   weight loss, No night sweats,  Fevers, chills, fatigue, or  lassitude.  HEENT:   No headaches,  Difficulty swallowing,  Tooth/dental problems, or  Sore throat,                No sneezing, itching, ear ache, nasal congestion, post nasal drip,   CV:  No chest pain,  Orthopnea, PND, swelling in lower extremities, anasarca, dizziness, palpitations, syncope.   GI  No heartburn, indigestion, abdominal pain, nausea, vomiting, diarrhea, change in bowel habits, loss of appetite, bloody stools. , large abdominal hernia  Resp: No shortness of breath with exertion or at rest.  No excess mucus, no productive cough,  No non-productive cough,  No coughing up of blood.  No change in color of mucus.  No wheezing.  No chest wall deformity  Skin: no rash or lesions.  GU: no dysuria, change in color of urine,  no urgency or frequency.  No flank pain, no hematuria   MS:  No joint pain or swelling.  No decreased range of motion.  No back pain.  Psych:  No change in mood or affect. No depression or anxiety.  No memory loss.   Vital Signs BP 120/72 (BP Location: Left Arm, Cuff Size: Normal)   Pulse 62   Temp (!) 97.4 F (36.3 C) (Oral)   Ht 5\' 11"  (1.803 m)   Wt 199 lb 3.2 oz (90.4 kg)   SpO2 100%   BMI 27.78 kg/m    Physical Exam:  General- No distress,  A&Ox3, pleasant ENT: No sinus tenderness, TM clear, pale nasal mucosa, no oral exudate,no post nasal drip, no LAN Cardiac: S1, S2, regular rate and rhythm, no murmur Chest: No wheeze/ rales/ dullness; no accessory muscle use, no nasal flaring, no sternal retractions Abd.: Soft Non-tender, ND, BS + Ext: No clubbing cyanosis, edema Neuro:  normal strength, MAE x 4, A&O x 3 Skin: No rashes,No lesions,  warm and dry Psych: normal mood and behavior   Assessment/Plan  OSA on CPAP Compliant with therapy AHI of 9.5 on set pressure of 13 Plan We will print  a prescription for a full face mask for your CPAP. We will increase pressure to 15 cm H2O If this is uncomfortable, let us know and we will reset this to 13 cm H2O and consider doing an auto titration  Continue on CPAP at bedtime. You appear to be benefiting from the treatment  Goal is to wear for at least 6 hours each night for maximal clinical benefit. Continue to work on weight loss, as the link between excess weight  and sleep apnea is  well established.   Remember to establish a good bedtime routine, and work on sleep hygiene.  Limit daytime naps , avoid stimulants such as caffeine and nicotine close to bedtime, exercise daily to promote sleep quality, avoid heavy , spicy, fried , or rich foods before bed. Ensure adequate exposure to natural light during the day,establish a relaxing bedtime routine with a pleasant sleep environment ( Bedroom between 60 and 67 degrees, turn off  bright lights , TV or device screens screens , consider black out curtains or white noise machines) Do not drive if sleepy. Remember to clean mask, tubing, filter, and reservoir once weekly with soapy water.  Follow up with Judson Roch NP  In 4-6 weeks  or before as needed.     Surgical Clearance for hernia repair Pt. Has a 10.1% chance of pulmonary complications: Major Pulmonary risks identified in the multifactorial risk analysis are but not limited to a) pneumonia; b) recurrent intubation risk; c) prolonged or recurrent acute respiratory failure needing mechanical ventilation; d) prolonged hospitalization; e) DVT/Pulmonary embolism; f) Acute Pulmonary edema. Plan Recommend the following post op 1. Short duration of surgery as much as possible and avoid paralytic if possible 2. Recovery in step down or ICU with Pulmonary consultation 3. DVT prophylaxis 4. Aggressive pulmonary toilet with o2, bronchodilatation, and incentive spirometry and early ambulation 5. Follow up with pulmonary as needed  Ask your surgeon if it is ok to use CPAP immediately after surgery. If it is ok, please take your CPAP from home and use that.  Pressure is 15 cm H2O. Will decrease as needed  Follow up in 4-6 weeks with down Load.  This appointment was over 45 minutes long with over 50% of the time being direct face to face patient care, assessment , plan of care , follow up, and post op recommendations for upcoming hernia repair   Magdalen Spatz, NP 10/08/2019  11:42 AM

## 2019-10-08 ENCOUNTER — Encounter (HOSPITAL_COMMUNITY): Payer: Self-pay

## 2019-10-08 ENCOUNTER — Other Ambulatory Visit: Payer: Self-pay

## 2019-10-08 ENCOUNTER — Encounter (HOSPITAL_COMMUNITY)
Admission: RE | Admit: 2019-10-08 | Discharge: 2019-10-08 | Disposition: A | Discharge status: Home / Self Care | Payer: 59 | Source: Ambulatory Visit | Attending: General Surgery | Admitting: General Surgery

## 2019-10-08 DIAGNOSIS — Z01812 Encounter for preprocedural laboratory examination: Secondary | ICD-10-CM | POA: Insufficient documentation

## 2019-10-08 HISTORY — DX: Deficiency of other specified B group vitamins: E53.8

## 2019-10-08 HISTORY — DX: Chronic kidney disease, unspecified: N18.9

## 2019-10-08 HISTORY — DX: Anemia, unspecified: D64.9

## 2019-10-08 HISTORY — DX: Pneumonia, unspecified organism: J18.9

## 2019-10-08 LAB — COMPREHENSIVE METABOLIC PANEL
ALT: 12 U/L (ref 0–44)
AST: 16 U/L (ref 15–41)
Albumin: 3.7 g/dL (ref 3.5–5.0)
Alkaline Phosphatase: 70 U/L (ref 38–126)
Anion gap: 8 (ref 5–15)
BUN: 20 mg/dL (ref 8–23)
CO2: 29 mmol/L (ref 22–32)
Calcium: 9.8 mg/dL (ref 8.9–10.3)
Chloride: 99 mmol/L (ref 98–111)
Creatinine, Ser: 0.85 mg/dL (ref 0.61–1.24)
GFR calc non Af Amer: >60 mL/min (ref >60)
Glucose, Bld: 96 mg/dL (ref 70–99)
Potassium: 4 mmol/L (ref 3.5–5.1)
Sodium: 136 mmol/L (ref 135–145)
Total Bilirubin: 0.8 mg/dL (ref 0.3–1.2)
Total Protein: 7.3 g/dL (ref 6.5–8.1)

## 2019-10-08 LAB — CBC
HCT: 38.3 % — ABNORMAL LOW (ref 39.0–52.0)
Hemoglobin: 12 g/dL — ABNORMAL LOW (ref 13.0–17.0)
MCH: 28.7 pg (ref 26.0–34.0)
MCHC: 31.3 g/dL (ref 30.0–36.0)
MCV: 91.6 fL (ref 80.0–100.0)
Platelets: 374 10*3/uL (ref 150–400)
RBC: 4.18 MIL/uL — ABNORMAL LOW (ref 4.22–5.81)
RDW: 13 % (ref 11.5–15.5)
WBC: 7.4 10*3/uL (ref 4.0–10.5)
nRBC: 0 % (ref 0.0–0.2)

## 2019-10-08 NOTE — Progress Notes (Addendum)
Your procedure is scheduled on 10/15/19.  Report to [facility] Main Entrance "A" at 7:00 A.M., and check in at the Admitting office.                Your surgery or procedure is scheduled to begin at 9:00 AM  Call this number if you have problems the morning of surgery:  [phone]  Call [phone] if you have any questions prior to your surgery date Monday-Friday 8am-4pm   Remember: Special instructions:   Please complete your  (2) PRE-SURGERY ENSURE  Drinks that were provided to you by the evening before surgery.  Please, if able, drink it in one setting. DO NOT SIP.   Do not eat after midnight the night before your surgery  You may drink clear liquids until 6:00 the morning of your surgery.   Clear liquids allowed are: Water, Non-Citrus Juices (without pulp), Carbonated Beverages, Clear Tea, Black Coffee Only, and Gatorade  Please complete your PRE-SURGERY ENSURE that was provided to you by 6:00 the morning of surgery.  Please, if able, drink it in one setting. DO NOT SIP.    Take these medicines the morning of surgery with A SIP OF WATER: amLODipine (NORVASC) gabapentin (NEURONTIN) metoprolol succinate (TOPROL-XL)  omeprazole (PRILOSEC) ALPRAZolam (XANAX) - as needed  As of today, STOP taking any Aspirin (unless otherwise instructed by your surgeon) Aleve, Naproxen, Ibuprofen, Motrin, Advil, Goody's, BC's, all herbal medications, fish oil, and all vitamins.          Special instructions:   [facility]- Preparing For Surgery  Before surgery, you can play an important role. Because skin is not sterile, your skin needs to be as free of germs as possible. You can reduce the number of germs on your skin by washing with CHG (chlorahexidine gluconate) Soap before surgery.  CHG is an antiseptic cleaner which kills germs and bonds with the skin to continue killing germs even after washing.    Oral Hygiene is also important to reduce your risk of infection.  Remember - BRUSH YOUR  TEETH THE MORNING OF SURGERY WITH YOUR REGULAR TOOTHPASTE  Please do not use if you have an allergy to CHG or antibacterial soaps. If your skin becomes reddened/irritated stop using the CHG.  Do not shave (including legs and underarms) for at least 48 hours prior to first CHG shower. It is OK to shave your face.  Please follow these instructions carefully.   1. Shower the NIGHT BEFORE SURGERY and the MORNING OF SURGERY with CHG Soap.   2. If you chose to wash your hair, wash your hair first as usual with your normal shampoo.  3. After you shampoo, rinse your hair and body thoroughly to remove the shampoo.  4. Use CHG as you would any other liquid soap. You can apply CHG directly to the skin and wash gently with a scrungie or a clean washcloth.   5. Apply the CHG Soap to your body ONLY FROM THE NECK DOWN.  Do not use on open wounds or open sores. Avoid contact with your eyes, ears, mouth and genitals (private parts). Wash Face and genitals (private parts)  with your normal soap.   6. Wash thoroughly, paying special attention to the area where your surgery will be performed.  7. Thoroughly rinse your body with warm water from the neck down.  8. DO NOT shower/wash with your normal soap after using and rinsing off the CHG Soap.  9. Pat yourself dry with a  CLEAN TOWEL.  10. Wear CLEAN PAJAMAS to bed the night before surgery  11. Place CLEAN SHEETS on your bed the night of your first shower and DO NOT SLEEP WITH PETS.   Day of Surgery: Shower as instructed above.             Wear Clean/Comfortable clothing the morning of surgery Do not wear lotions, powders, colognes, or deodorant. Remember to brush your teeth WITH YOUR REGULAR TOOTHPASTE.             Do not wear jewelry             Men may shave face and neck.             Do not bring valuables to the hospital.             [facility] is not responsible for any belongings or valuables.  Do NOT Smoke (Tobacco/Vaping) or drink  Alcohol 24 hours prior to your procedure If you use a CPAP at night, you may bring all equipment for your overnight stay.   Contacts, glasses, dentures or bridgework may not be worn into surgery.      For patients admitted to the hospital, discharge time will be determined by your treatment team.   Patients discharged the day of surgery will not be allowed to drive home, and someone needs to stay with them for 24 hours.  Please read over the following fact sheets that you were given: Coughing and Deep Breathing, Pain Booklet,  and Surgical Site Infections

## 2019-10-08 NOTE — Progress Notes (Addendum)
PCP - Dr. Oletha Blend  Cardiologist - LouisT. Claiborne Billings  Pulmonologist- Dr. 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  Dr, Tamala [name] has concerns regarding anesthesia.  Louis [name] reported that patient's Blood pressure dropped when he had a paralytic. Louis [name] also  Is concerned -because patient had a respiratory arrest (hypercarbic) on 07/01/19, while he was hospitalized with Sepsis.Dr. Doroteo Glassman  Louis [name] said that they would feel better if they could talk to an anesthesiologist prior to the day of surgery.  Dr. Glennon Mac was not avaible, Dr. Doroteo Glassman reviewed the chart and came to PAT to speak with Mr. and Louis [name]. Louis [name] has cardiac clearance on 09/30/19 Louis [name] has Pulmonology clearance with some recommendations, including patient be admitted to an ICU or Step down bed with a pulmonary consult.  Pt denies having chest pain, sob, or fever at this time. All instructions explained to the pt, with a verbal understanding of the material. Pt agrees to go over the instructions while at home for a better understanding. Pt also instructed to self quarantine after being tested for COVID-19. The opportunity to ask questions was provided.

## 2019-10-09 NOTE — Progress Notes (Signed)
Anesthesia Chart Review:  Pertinent history includes HTN, scoliosis, mild renal artery stenosis, perforated duodenal ulcer s/p surgery, bacteremia with unremarkable TEE 06/2019, mild-moderate mitral regurgitation, respiratory hypercarbic arrest in context of severe OSA (not placed on CPAP the night prior), anxiety, BPH, COPD, GERD,  Asthma.  Cardiac clearance per telephone encounter 09/30/2019, "Chart revisited as part of pre-operative protocol coverage. Reviewed medical history and clinical stability with Dr. Martinique who indicates the patient can be cleared for his surgery without further cardiac testing. The patient was advised that if he develops new symptoms prior to surgery to contact our office to arrange for a follow-up visit, and he verbalized understanding."  Follows with pulmonology for history of OSA on CPAP.  Recent hospitalization 06/25/2019-7/17/2021with sepsis 2/2Enterococcusin his blood and urine. Additionally he required intubation 6/30-7/1 for hypercarbic respiratory failure/ HAP prior to admission to inpatient rehab 7/6-7/17. He is followed by [name] for his OSA as an outpatient. Last seen by pulmonology 10/07/2019.  Per note, he is compliant with CPAP therapy.  AHI of 9.5 on set pressure of 13.  Clearance for upcoming surgery was also addressed: Surgical Clearance for hernia repair Pt. Has a 10.1% chance of pulmonary complications: Major Pulmonary risks identified in the multifactorial risk analysis are but not limited to a) pneumonia; b) recurrent intubation risk; c) prolonged or recurrent acute respiratory failure needing mechanical ventilation; d) prolonged hospitalization; e) DVT/Pulmonary embolism; f) Acute Pulmonary edema. Plan Recommend the following post op 1. Short duration of surgery as much as possible and avoid paralytic if possible 2. Recovery in step down or ICU with Pulmonary consultation 3. DVT prophylaxis 4. Aggressive pulmonary toilet with o2, bronchodilatation,  and incentive spirometry and early ambulation 5. Follow up with pulmonary as needed  Ask your surgeon if it is ok to use CPAP immediately after surgery. If it is ok, please take your CPAP from home and use that.  Pressure is 15 cm H2O. Will decrease as needed  Follow up in 4-6 weeks with down Load.  Preop labs reviewed, mild anemia with hemoglobin 12.0.  Otherwise unremarkable.  EKG 07/29/2019: Normal sinus rhythm.  Rate 71.  PFT 08/15/2017: FVC-%Pred-Pre Latest Units: % 68  FEV1-%Pred-Pre Latest Units: % 58  FEV1FVC-%Pred-Pre Latest Units: % 84  TLC % pred Latest Units: % 90  RV % pred Latest Units: % 133  DLCO unc % pred Latest Units: % 96    TEE 06/29/2019: 1. Left ventricular ejection fraction, by estimation, is 60 to 65%. The  left ventricle has normal function. The left ventricle has no regional  wall motion abnormalities.  2. Right ventricular systolic function is normal. The right ventricular  size is normal.  3. No left atrial/left atrial appendage thrombus was detected.  4. The mitral valve is grossly normal. Mild to moderate mitral valve  regurgitation. No evidence of mitral stenosis.  5. The aortic valve is normal in structure. Aortic valve regurgitation is  trivial. No aortic stenosis is present.   TTE 06/26/2019: 1. Left ventricular ejection fraction, by estimation, is 55 to 60%. The  left ventricle has normal function. The left ventricle has no regional  wall motion abnormalities. Left ventricular diastolic parameters were  normal.  2. Right ventricular systolic function is mildly reduced at the RV apex.  The right ventricular size is normal. Tricuspid regurgitation signal is  inadequate for assessing PA pressure.  3. LA not well visualized. Left atrial size was mildly dilated by visual  estimate.  4. The mitral valve is  normal in structure. Trivial mitral valve  regurgitation. No evidence of mitral stenosis.  5. The aortic valve is tricuspid. Aortic  valve regurgitation is not  visualized. No aortic stenosis is present.  6. Aortic dilatation noted. There is mild dilatation of the aortic root  measuring 43 mm.  7. The inferior vena cava is normal in size with greater than 50%  respiratory variability, suggesting right atrial pressure of 3 mmHg.    Wynonia Musty [facility] Short Stay Center/Anesthesiology Phone [phone] 10/09/2019 12:58 PM

## 2019-10-09 NOTE — Anesthesia Preprocedure Evaluation (Addendum)
Anesthesia Evaluation  Patient identified by MRN, date of birth, ID band Patient awake    Reviewed: Allergy & Precautions, NPO status , [name] Chart, lab work & pertinent test results, reviewed documented beta blocker date and time   Airway Mallampati: II  TM Distance: >3 FB Neck ROM: Full    Dental  (+) Dental Advisory Given, Teeth Intact, Chipped,    Pulmonary asthma , sleep apnea , COPD,    breath sounds clear to auscultation       Cardiovascular hypertension, Pt. on medications and Pt. on home beta blockers + Peripheral Vascular Disease and +CHF  + dysrhythmias  Rhythm:Regular Rate:Normal     Neuro/Psych Anxiety    GI/Hepatic Neg liver ROS, GERD  Medicated,  Endo/Other  negative endocrine ROS  Renal/GU CRFRenal disease     Musculoskeletal negative musculoskeletal ROS (+)   Abdominal (+) - obese,   Peds  Hematology negative hematology ROS (+)   Anesthesia Other Findings   Reproductive/Obstetrics                         Anesthesia Physical Anesthesia Plan  ASA: III  Anesthesia Plan: General   Post-op Pain Management:    Induction: Intravenous  PONV Risk Score and Plan: 3 and Ondansetron, Dexamethasone and Midazolam  Airway Management Planned: Oral ETT  Additional Equipment: None  Intra-op Plan:   Post-operative Plan: Extubation in OR  Informed Consent: I have reviewed the patients History and Physical, chart, labs and discussed the procedure including the risks, benefits and alternatives for the proposed anesthesia with the patient or authorized representative who has indicated his/her understanding and acceptance.     Dental advisory given  Plan Discussed with: CRNA  Anesthesia Plan Comments: (PAT note by Karoline Caldwell, PA-C: Pertinent history includes HTN, scoliosis, mild renal artery stenosis, perforated duodenal ulcer s/p surgery, bacteremia with unremarkable TEE  06/2019, mild-moderate mitral regurgitation, respiratory hypercarbic arrest in context of severe OSA (not placed on CPAP the night prior), anxiety, BPH, COPD, GERD,  Asthma.  Cardiac clearance per telephone encounter 09/30/2019, "Chart revisited as part of pre-operative protocol coverage. Reviewed medical history and clinical stability with Dr. Martinique who indicates the patient can be cleared for his surgery without further cardiac testing. The patient was advised that if he develops new symptoms prior to surgery to contact our office to arrange for a follow-up visit, and he verbalized understanding."  Follows with pulmonology for history of OSA on CPAP.  Recent hospitalization 06/25/2019-7/17/2021with sepsis 2/2Enterococcusin his blood and urine. Additionally he required intubation 6/30-7/1 for hypercarbic respiratory failure/ HAP prior to admission to inpatient rehab 7/6-7/17. He is followed by [name] for his OSA as an outpatient. Last seen by pulmonology 10/07/2019.  Per note, he is compliant with CPAP therapy.  AHI of 9.5 on set pressure of 13.  Clearance for upcoming surgery was also addressed: Surgical Clearance for hernia repair Pt. Has a 10.1% chance of pulmonary complications: Major Pulmonary risks identified in the multifactorial risk analysis are but not limited to a) pneumonia; b) recurrent intubation risk; c) prolonged or recurrent acute respiratory failure needing mechanical ventilation; d) prolonged hospitalization; e) DVT/Pulmonary embolism; f) Acute Pulmonary edema. Plan Recommend the following post op 1. Short duration of surgery as much as possible and avoid paralytic if possible 2. Recovery in step down or ICU with Pulmonary consultation 3. DVT prophylaxis 4. Aggressive pulmonary toilet with o2, bronchodilatation, and incentive spirometry and early ambulation 5. Follow up with  pulmonary as needed  Ask your surgeon if it is ok to use CPAP immediately after surgery. If it is ok,  please take your CPAP from home and use that.  Pressure is 15 cm H2O. Will decrease as needed  Follow up in 4-6 weeks with down Load.  Preop labs reviewed, mild anemia with hemoglobin 12.0.  Otherwise unremarkable.  EKG 07/29/2019: Normal sinus rhythm.  Rate 71.  PFT 08/15/2017: FVC-%Pred-Pre Latest Units: % 68 FEV1-%Pred-Pre Latest Units: % 58 FEV1FVC-%Pred-Pre Latest Units: % 84 TLC % pred Latest Units: % 90 RV % pred Latest Units: % 133 DLCO unc % pred Latest Units: % 96   TEE 06/29/2019: 1. Left ventricular ejection fraction, by estimation, is 60 to 65%. The  left ventricle has normal function. The left ventricle has no regional  wall motion abnormalities.  2. Right ventricular systolic function is normal. The right ventricular  size is normal.  3. No left atrial/left atrial appendage thrombus was detected.  4. The mitral valve is grossly normal. Mild to moderate mitral valve  regurgitation. No evidence of mitral stenosis.  5. The aortic valve is normal in structure. Aortic valve regurgitation is  trivial. No aortic stenosis is present.   TTE 06/26/2019: 1. Left ventricular ejection fraction, by estimation, is 55 to 60%. The  left ventricle has normal function. The left ventricle has no regional  wall motion abnormalities. Left ventricular diastolic parameters were  normal.  2. Right ventricular systolic function is mildly reduced at the RV apex.  The right ventricular size is normal. Tricuspid regurgitation signal is  inadequate for assessing PA pressure.  3. LA not well visualized. Left atrial size was mildly dilated by visual  estimate.  4. The mitral valve is normal in structure. Trivial mitral valve  regurgitation. No evidence of mitral stenosis.  5. The aortic valve is tricuspid. Aortic valve regurgitation is not  visualized. No aortic stenosis is present.  6. Aortic dilatation noted. There is mild dilatation of the aortic root  measuring 43 mm.  7. The  inferior vena cava is normal in size with greater than 50%  respiratory variability, suggesting right atrial pressure of 3 mmHg.    )      Anesthesia Quick Evaluation

## 2019-10-12 ENCOUNTER — Other Ambulatory Visit: Payer: Self-pay

## 2019-10-12 ENCOUNTER — Other Ambulatory Visit (HOSPITAL_COMMUNITY): Payer: 59

## 2019-10-12 ENCOUNTER — Ambulatory Visit
Admission: RE | Admit: 2019-10-12 | Discharge: 2019-10-12 | Disposition: A | Discharge status: Home / Self Care | Payer: 59 | Source: Ambulatory Visit | Attending: Family Medicine | Admitting: Family Medicine

## 2019-10-12 ENCOUNTER — Other Ambulatory Visit (HOSPITAL_COMMUNITY)
Admission: RE | Admit: 2019-10-12 | Discharge: 2019-10-12 | Disposition: A | Discharge status: Home / Self Care | Payer: 59 | Source: Ambulatory Visit | Attending: General Surgery | Admitting: General Surgery

## 2019-10-12 DIAGNOSIS — Z01818 Encounter for other preprocedural examination: Secondary | ICD-10-CM | POA: Insufficient documentation

## 2019-10-12 DIAGNOSIS — Z20822 Contact with and (suspected) exposure to covid-19: Secondary | ICD-10-CM | POA: Diagnosis not present

## 2019-10-12 DIAGNOSIS — M818 Other osteoporosis without current pathological fracture: Secondary | ICD-10-CM

## 2019-10-12 LAB — SARS CORONAVIRUS 2 (TAT 6-24 HRS): SARS Coronavirus 2: NEGATIVE

## 2019-10-14 ENCOUNTER — Ambulatory Visit: Payer: 59

## 2019-10-15 ENCOUNTER — Encounter (HOSPITAL_COMMUNITY)
Admission: RE | Disposition: A | Discharge status: Home / Self Care | Payer: Self-pay | Source: Home / Self Care | Attending: General Surgery

## 2019-10-15 ENCOUNTER — Other Ambulatory Visit: Payer: Self-pay

## 2019-10-15 ENCOUNTER — Ambulatory Visit (HOSPITAL_COMMUNITY): Payer: 59 | Admitting: Anesthesiology

## 2019-10-15 ENCOUNTER — Encounter (HOSPITAL_COMMUNITY): Payer: Self-pay | Admitting: General Surgery

## 2019-10-15 ENCOUNTER — Observation Stay (HOSPITAL_COMMUNITY)
Admission: RE | Admit: 2019-10-15 | Discharge: 2019-10-16 | Disposition: A | Discharge status: Home / Self Care | Payer: 59 | Attending: General Surgery | Admitting: General Surgery

## 2019-10-15 ENCOUNTER — Ambulatory Visit (HOSPITAL_COMMUNITY): Payer: 59 | Admitting: Physician Assistant

## 2019-10-15 DIAGNOSIS — K432 Incisional hernia without obstruction or gangrene: Principal | ICD-10-CM | POA: Insufficient documentation

## 2019-10-15 DIAGNOSIS — I509 Heart failure, unspecified: Secondary | ICD-10-CM | POA: Insufficient documentation

## 2019-10-15 DIAGNOSIS — Z79899 Other long term (current) drug therapy: Secondary | ICD-10-CM | POA: Insufficient documentation

## 2019-10-15 DIAGNOSIS — J449 Chronic obstructive pulmonary disease, unspecified: Secondary | ICD-10-CM | POA: Diagnosis not present

## 2019-10-15 DIAGNOSIS — I110 Hypertensive heart disease with heart failure: Secondary | ICD-10-CM | POA: Insufficient documentation

## 2019-10-15 DIAGNOSIS — Z9889 Other specified postprocedural states: Secondary | ICD-10-CM

## 2019-10-15 DIAGNOSIS — Z8719 Personal history of other diseases of the digestive system: Secondary | ICD-10-CM

## 2019-10-15 HISTORY — PX: INCISIONAL HERNIA REPAIR: SHX193

## 2019-10-15 HISTORY — PX: LYSIS OF ADHESION: SHX5961

## 2019-10-15 HISTORY — PX: LAPAROTOMY: SHX154

## 2019-10-15 LAB — GLUCOSE, CAPILLARY: Glucose-Capillary: 116 mg/dL — ABNORMAL HIGH (ref 70–99)

## 2019-10-15 SURGERY — LAPAROTOMY, EXPLORATORY
Anesthesia: General | Site: Abdomen

## 2019-10-15 MED ORDER — EPHEDRINE 5 MG/ML INJ
INTRAVENOUS | Status: AC
  Filled 2019-10-15: qty 10

## 2019-10-15 MED ORDER — FOLIC ACID 1 MG PO TABS: 1.0000 mg | ORAL_TABLET | Freq: Every day | ORAL | Status: DC

## 2019-10-15 MED ORDER — ACETAMINOPHEN 325 MG PO TABS: 325.0000 mg | ORAL_TABLET | Freq: Once | ORAL | Status: DC | PRN

## 2019-10-15 MED ORDER — LOSARTAN POTASSIUM 50 MG PO TABS
100.0000 mg | ORAL_TABLET | Freq: Every day | ORAL | Status: DC
  Administered 2019-10-15: 100 mg via ORAL
  Filled 2019-10-15: qty 2

## 2019-10-15 MED ORDER — MEPERIDINE HCL 25 MG/ML IJ SOLN: 6.2500 mg | INTRAMUSCULAR | Status: DC | PRN

## 2019-10-15 MED ORDER — LIDOCAINE 2% (20 MG/ML) 5 ML SYRINGE
INTRAMUSCULAR | Status: AC
  Filled 2019-10-15: qty 5

## 2019-10-15 MED ORDER — FENTANYL CITRATE (PF) 100 MCG/2ML IJ SOLN
INTRAMUSCULAR | Status: DC | PRN
Start: 2019-10-15 — End: 2019-10-15
  Administered 2019-10-15: 100 ug via INTRAVENOUS
  Administered 2019-10-15: 50 ug via INTRAVENOUS

## 2019-10-15 MED ORDER — EPHEDRINE SULFATE-NACL 50-0.9 MG/10ML-% IV SOSY
PREFILLED_SYRINGE | INTRAVENOUS | Status: DC | PRN
  Administered 2019-10-15: 10 mg via INTRAVENOUS

## 2019-10-15 MED ORDER — SUCCINYLCHOLINE CHLORIDE 200 MG/10ML IV SOSY
PREFILLED_SYRINGE | INTRAVENOUS | Status: AC
  Filled 2019-10-15: qty 10

## 2019-10-15 MED ORDER — METOPROLOL TARTRATE 5 MG/5ML IV SOLN
5.0000 mg | Freq: Four times a day (QID) | INTRAVENOUS | Status: DC | PRN
  Filled 2019-10-15: qty 5

## 2019-10-15 MED ORDER — AMISULPRIDE (ANTIEMETIC) 5 MG/2ML IV SOLN: 10.0000 mg | Freq: Once | INTRAVENOUS | Status: DC | PRN

## 2019-10-15 MED ORDER — DOCUSATE SODIUM 100 MG PO CAPS
100.0000 mg | ORAL_CAPSULE | Freq: Two times a day (BID) | ORAL | Status: DC
  Administered 2019-10-15 – 2019-10-16 (×3): 100 mg via ORAL
  Filled 2019-10-15 (×2): qty 1

## 2019-10-15 MED ORDER — DEXAMETHASONE SODIUM PHOSPHATE 10 MG/ML IJ SOLN
INTRAMUSCULAR | Status: DC | PRN
  Administered 2019-10-15: 5 mg via INTRAVENOUS

## 2019-10-15 MED ORDER — DEXTROSE-NACL 5-0.9 % IV SOLN: INTRAVENOUS | Status: DC

## 2019-10-15 MED ORDER — VISTASEAL 10 ML SINGLE DOSE KIT
10.0000 mL | PACK | Freq: Once | CUTANEOUS | Status: AC
  Administered 2019-10-15: 10 mL via TOPICAL
  Filled 2019-10-15: qty 10

## 2019-10-15 MED ORDER — ENSURE PRE-SURGERY PO LIQD: 592.0000 mL | Freq: Once | ORAL | Status: DC

## 2019-10-15 MED ORDER — SUGAMMADEX SODIUM 200 MG/2ML IV SOLN
INTRAVENOUS | Status: DC | PRN
  Administered 2019-10-15: 200 mg via INTRAVENOUS

## 2019-10-15 MED ORDER — TRAMADOL HCL 50 MG PO TABS
50.0000 mg | ORAL_TABLET | Freq: Four times a day (QID) | ORAL | Status: DC | PRN
  Administered 2019-10-15 – 2019-10-16 (×3): 50 mg via ORAL
  Filled 2019-10-15 (×2): qty 1

## 2019-10-15 MED ORDER — POLYETHYLENE GLYCOL 3350 17 G PO PACK
17.0000 g | PACK | Freq: Every day | ORAL | Status: DC
  Administered 2019-10-15: 17 g via ORAL
  Filled 2019-10-15 (×2): qty 1

## 2019-10-15 MED ORDER — PROPOFOL 10 MG/ML IV BOLUS
INTRAVENOUS | Status: AC
  Filled 2019-10-15: qty 40

## 2019-10-15 MED ORDER — ONDANSETRON HCL 4 MG/2ML IJ SOLN: 4.0000 mg | Freq: Four times a day (QID) | INTRAMUSCULAR | Status: DC | PRN

## 2019-10-15 MED ORDER — LACTATED RINGERS IV SOLN: INTRAVENOUS | Status: DC

## 2019-10-15 MED ORDER — 0.9 % SODIUM CHLORIDE (POUR BTL) OPTIME
TOPICAL | Status: DC | PRN
  Administered 2019-10-15 (×3): 1000 mL

## 2019-10-15 MED ORDER — CHLORHEXIDINE GLUCONATE CLOTH 2 % EX PADS: 6.0000 | MEDICATED_PAD | Freq: Once | CUTANEOUS | Status: DC

## 2019-10-15 MED ORDER — PHENYLEPHRINE HCL (PRESSORS) 10 MG/ML IV SOLN
INTRAVENOUS | Status: DC | PRN
  Administered 2019-10-15: 40 ug via INTRAVENOUS

## 2019-10-15 MED ORDER — ACETAMINOPHEN 500 MG PO TABS
1000.0000 mg | ORAL_TABLET | ORAL | Status: AC
  Administered 2019-10-15: 1000 mg via ORAL
  Filled 2019-10-15: qty 2

## 2019-10-15 MED ORDER — KETAMINE HCL 50 MG/5ML IJ SOSY
PREFILLED_SYRINGE | INTRAMUSCULAR | Status: AC
  Filled 2019-10-15: qty 5

## 2019-10-15 MED ORDER — PANTOPRAZOLE SODIUM 40 MG PO TBEC
40.0000 mg | DELAYED_RELEASE_TABLET | Freq: Every day | ORAL | Status: DC
  Administered 2019-10-16: 40 mg via ORAL
  Filled 2019-10-15: qty 1

## 2019-10-15 MED ORDER — MAGNESIUM OXIDE 400 (241.3 MG) MG PO TABS: 400.0000 mg | ORAL_TABLET | Freq: Every day | ORAL | Status: DC

## 2019-10-15 MED ORDER — GABAPENTIN 300 MG PO CAPS
300.0000 mg | ORAL_CAPSULE | ORAL | Status: AC
  Administered 2019-10-15: 300 mg via ORAL
  Filled 2019-10-15: qty 1

## 2019-10-15 MED ORDER — LIDOCAINE HCL (CARDIAC) PF 100 MG/5ML IV SOSY
PREFILLED_SYRINGE | INTRAVENOUS | Status: DC | PRN
  Administered 2019-10-15: 50 mg via INTRAVENOUS

## 2019-10-15 MED ORDER — BUPIVACAINE LIPOSOME 1.3 % IJ SUSP
20.0000 mL | Freq: Once | INTRAMUSCULAR | Status: AC
  Administered 2019-10-15: 266 mg
  Filled 2019-10-15: qty 20

## 2019-10-15 MED ORDER — ROCURONIUM BROMIDE 10 MG/ML (PF) SYRINGE
PREFILLED_SYRINGE | INTRAVENOUS | Status: AC
  Filled 2019-10-15: qty 10

## 2019-10-15 MED ORDER — THIAMINE HCL 100 MG PO TABS: 100.0000 mg | ORAL_TABLET | Freq: Every day | ORAL | Status: DC

## 2019-10-15 MED ORDER — DEXAMETHASONE SODIUM PHOSPHATE 10 MG/ML IJ SOLN
INTRAMUSCULAR | Status: AC
  Filled 2019-10-15: qty 1

## 2019-10-15 MED ORDER — ADULT MULTIVITAMIN W/MINERALS CH: 1.0000 | ORAL_TABLET | Freq: Every day | ORAL | Status: DC

## 2019-10-15 MED ORDER — PHENYLEPHRINE HCL-NACL 10-0.9 MG/250ML-% IV SOLN
INTRAVENOUS | Status: DC | PRN
  Administered 2019-10-15: 40 ug/min via INTRAVENOUS

## 2019-10-15 MED ORDER — PHENYLEPHRINE 40 MCG/ML (10ML) SYRINGE FOR IV PUSH (FOR BLOOD PRESSURE SUPPORT)
PREFILLED_SYRINGE | INTRAVENOUS | Status: AC
  Filled 2019-10-15: qty 10

## 2019-10-15 MED ORDER — ACETAMINOPHEN 160 MG/5ML PO SOLN: 325.0000 mg | Freq: Once | ORAL | Status: DC | PRN

## 2019-10-15 MED ORDER — ONDANSETRON 4 MG PO TBDP: 4.0000 mg | ORAL_TABLET | Freq: Four times a day (QID) | ORAL | Status: DC | PRN

## 2019-10-15 MED ORDER — GABAPENTIN 300 MG PO CAPS: 300.0000 mg | ORAL_CAPSULE | Freq: Two times a day (BID) | ORAL | Status: DC

## 2019-10-15 MED ORDER — ENSURE PRE-SURGERY PO LIQD: 296.0000 mL | Freq: Once | ORAL | Status: DC

## 2019-10-15 MED ORDER — ORAL CARE MOUTH RINSE: 15.0000 mL | Freq: Once | OROMUCOSAL | Status: AC

## 2019-10-15 MED ORDER — FENTANYL CITRATE (PF) 100 MCG/2ML IJ SOLN
25.0000 ug | INTRAMUSCULAR | Status: DC | PRN
  Administered 2019-10-15 (×3): 50 ug via INTRAVENOUS

## 2019-10-15 MED ORDER — PROPOFOL 10 MG/ML IV BOLUS
INTRAVENOUS | Status: DC | PRN
  Administered 2019-10-15: 140 mg via INTRAVENOUS

## 2019-10-15 MED ORDER — GABAPENTIN 300 MG PO CAPS
300.0000 mg | ORAL_CAPSULE | Freq: Two times a day (BID) | ORAL | Status: DC
  Administered 2019-10-15 – 2019-10-16 (×2): 300 mg via ORAL
  Filled 2019-10-15 (×2): qty 1

## 2019-10-15 MED ORDER — AMLODIPINE BESYLATE 5 MG PO TABS
5.0000 mg | ORAL_TABLET | Freq: Every day | ORAL | Status: DC
  Administered 2019-10-16: 5 mg via ORAL
  Filled 2019-10-15: qty 1

## 2019-10-15 MED ORDER — HYDROMORPHONE HCL 1 MG/ML IJ SOLN: 1.0000 mg | INTRAMUSCULAR | Status: DC | PRN

## 2019-10-15 MED ORDER — STERILE WATER FOR IRRIGATION IR SOLN
Status: DC | PRN
  Administered 2019-10-15: 1000 mL

## 2019-10-15 MED ORDER — SODIUM CHLORIDE 0.9% FLUSH
INTRAVENOUS | Status: DC | PRN
  Administered 2019-10-15: 20 mL

## 2019-10-15 MED ORDER — FENTANYL CITRATE (PF) 250 MCG/5ML IJ SOLN
INTRAMUSCULAR | Status: AC
Start: 2019-10-15 — End: ?
  Filled 2019-10-15: qty 5

## 2019-10-15 MED ORDER — FENTANYL CITRATE (PF) 100 MCG/2ML IJ SOLN
INTRAMUSCULAR | Status: AC
  Filled 2019-10-15: qty 2

## 2019-10-15 MED ORDER — MIDAZOLAM HCL 2 MG/2ML IJ SOLN
INTRAMUSCULAR | Status: AC
  Filled 2019-10-15: qty 2

## 2019-10-15 MED ORDER — ONDANSETRON HCL 4 MG/2ML IJ SOLN
INTRAMUSCULAR | Status: AC
  Filled 2019-10-15: qty 2

## 2019-10-15 MED ORDER — CEFAZOLIN SODIUM-DEXTROSE 2-4 GM/100ML-% IV SOLN
2.0000 g | INTRAVENOUS | Status: AC
  Administered 2019-10-15: 2 g via INTRAVENOUS
  Filled 2019-10-15: qty 100

## 2019-10-15 MED ORDER — MIDAZOLAM HCL 5 MG/5ML IJ SOLN
INTRAMUSCULAR | Status: DC | PRN
  Administered 2019-10-15: 2 mg via INTRAVENOUS

## 2019-10-15 MED ORDER — ACETAMINOPHEN 10 MG/ML IV SOLN: 1000.0000 mg | Freq: Once | INTRAVENOUS | Status: DC | PRN

## 2019-10-15 MED ORDER — METOPROLOL SUCCINATE ER 50 MG PO TB24
50.0000 mg | ORAL_TABLET | Freq: Every day | ORAL | Status: DC
  Administered 2019-10-16: 50 mg via ORAL
  Filled 2019-10-15: qty 1

## 2019-10-15 MED ORDER — ROCURONIUM BROMIDE 100 MG/10ML IV SOLN
INTRAVENOUS | Status: DC | PRN
  Administered 2019-10-15: 10 mg via INTRAVENOUS
  Administered 2019-10-15: 60 mg via INTRAVENOUS

## 2019-10-15 MED ORDER — TRAMADOL HCL 50 MG PO TABS
ORAL_TABLET | ORAL | Status: AC
  Filled 2019-10-15: qty 1

## 2019-10-15 MED ORDER — CHLORHEXIDINE GLUCONATE 0.12 % MT SOLN
15.0000 mL | Freq: Once | OROMUCOSAL | Status: AC
  Administered 2019-10-15: 15 mL via OROMUCOSAL
  Filled 2019-10-15: qty 15

## 2019-10-15 MED ORDER — ONDANSETRON HCL 4 MG/2ML IJ SOLN
INTRAMUSCULAR | Status: DC | PRN
  Administered 2019-10-15: 4 mg via INTRAVENOUS

## 2019-10-15 MED ORDER — KETAMINE HCL 10 MG/ML IJ SOLN
INTRAMUSCULAR | Status: DC | PRN
  Administered 2019-10-15: 20 mg via INTRAVENOUS

## 2019-10-15 MED ORDER — METHOCARBAMOL 500 MG PO TABS
500.0000 mg | ORAL_TABLET | Freq: Four times a day (QID) | ORAL | Status: DC | PRN
  Administered 2019-10-15 – 2019-10-16 (×3): 500 mg via ORAL
  Filled 2019-10-15 (×2): qty 1

## 2019-10-15 MED ORDER — ALPRAZOLAM 0.5 MG PO TABS: 0.5000 mg | ORAL_TABLET | Freq: Every day | ORAL | Status: DC | PRN

## 2019-10-15 MED ORDER — METHOCARBAMOL 500 MG PO TABS
ORAL_TABLET | ORAL | Status: AC
  Filled 2019-10-15: qty 1

## 2019-10-15 MED ORDER — ACETAMINOPHEN 500 MG PO TABS
500.0000 mg | ORAL_TABLET | Freq: Four times a day (QID) | ORAL | Status: DC | PRN
  Administered 2019-10-15 – 2019-10-16 (×2): 500 mg via ORAL
  Filled 2019-10-15 (×2): qty 1

## 2019-10-15 SURGICAL SUPPLY — 49 items
BLADE CLIPPER SURG (BLADE) ×3 IMPLANT
BLADE SURG 11 STRL SS (BLADE) ×3 IMPLANT
CANISTER SUCT 3000ML PPV (MISCELLANEOUS) ×3 IMPLANT
CHLORAPREP W/TINT 26 (MISCELLANEOUS) ×3 IMPLANT
COVER SURGICAL LIGHT HANDLE (MISCELLANEOUS) ×3 IMPLANT
COVER WAND RF STERILE (DRAPES) ×3 IMPLANT
DERMABOND ADVANCED (GAUZE/BANDAGES/DRESSINGS) ×2
DERMABOND ADVANCED .7 DNX12 (GAUZE/BANDAGES/DRESSINGS) ×1 IMPLANT
DEVICE TROCAR PUNCTURE CLOSURE (ENDOMECHANICALS) ×3 IMPLANT
DRAPE INCISE IOBAN 66X45 STRL (DRAPES) ×3 IMPLANT
DRAPE LAPAROSCOPIC ABDOMINAL (DRAPES) ×3 IMPLANT
DRAPE WARM FLUID 44X44 (DRAPES) ×3 IMPLANT
DRSG OPSITE POSTOP 4X10 (GAUZE/BANDAGES/DRESSINGS) IMPLANT
DRSG OPSITE POSTOP 4X8 (GAUZE/BANDAGES/DRESSINGS) ×3 IMPLANT
ELECT BLADE 6.5 EXT (BLADE) ×3 IMPLANT
ELECT CAUTERY BLADE 6.4 (BLADE) ×3 IMPLANT
ELECT REM PT RETURN 9FT ADLT (ELECTROSURGICAL) ×3
ELECTRODE REM PT RTRN 9FT ADLT (ELECTROSURGICAL) ×1 IMPLANT
GLOVE BIO SURGEON STRL SZ7.5 (GLOVE) ×3 IMPLANT
GLOVE BIOGEL PI IND STRL 8 (GLOVE) ×1 IMPLANT
GLOVE BIOGEL PI INDICATOR 8 (GLOVE) ×2
GOWN STRL REUS W/ TWL LRG LVL3 (GOWN DISPOSABLE) ×2 IMPLANT
GOWN STRL REUS W/ TWL XL LVL3 (GOWN DISPOSABLE) ×1 IMPLANT
GOWN STRL REUS W/TWL LRG LVL3 (GOWN DISPOSABLE) ×6
GOWN STRL REUS W/TWL XL LVL3 (GOWN DISPOSABLE) ×3
HANDLE SUCTION POOLE (INSTRUMENTS) ×1 IMPLANT
KIT BASIN OR (CUSTOM PROCEDURE TRAY) ×3 IMPLANT
KIT TURNOVER KIT B (KITS) ×3 IMPLANT
MESH PROLENE PML 12X12 (Mesh General) ×3 IMPLANT
NEEDLE HYPO 22GX1.5 SAFETY (NEEDLE) ×3 IMPLANT
NEEDLE HYPO 25GX1X1/2 BEV (NEEDLE) ×3 IMPLANT
NS IRRIG 1000ML POUR BTL (IV SOLUTION) ×6 IMPLANT
PACK GENERAL/GYN (CUSTOM PROCEDURE TRAY) ×3 IMPLANT
PAD ARMBOARD 7.5X6 YLW CONV (MISCELLANEOUS) ×6 IMPLANT
PENCIL SMOKE EVACUATOR (MISCELLANEOUS) ×3 IMPLANT
SPONGE LAP 18X18 RF (DISPOSABLE) ×6 IMPLANT
STAPLER VISISTAT 35W (STAPLE) ×3 IMPLANT
SUCTION POOLE HANDLE (INSTRUMENTS) ×3
SUT NOVA NAB GS-21 0 18 T12 DT (SUTURE) ×6 IMPLANT
SUT PDS AB 1 TP1 54 (SUTURE) ×12 IMPLANT
SUT SILK 2 0 TIES 10X30 (SUTURE) ×3 IMPLANT
SUT SILK 3 0 TIES 10X30 (SUTURE) ×3 IMPLANT
SYR 20ML LL LF (SYRINGE) ×3 IMPLANT
SYR CONTROL 10ML LL (SYRINGE) ×3 IMPLANT
TOWEL GREEN STERILE (TOWEL DISPOSABLE) ×3 IMPLANT
TOWEL GREEN STERILE FF (TOWEL DISPOSABLE) ×3 IMPLANT
TRAY FOLEY MTR SLVR 16FR STAT (SET/KITS/TRAYS/PACK) ×3 IMPLANT
WATER STERILE IRR 1000ML POUR (IV SOLUTION) ×3 IMPLANT
YANKAUER SUCT BULB TIP NO VENT (SUCTIONS) ×3 IMPLANT

## 2019-10-15 NOTE — Interval H&P Note (Signed)
History and Physical Interval Note:  10/15/2019 7:07 AM  Louis [name]  has presented today for surgery, with the diagnosis of INCISIONAL HERNIA.  The various methods of treatment have been discussed with the patient and family. After consideration of risks, benefits and other options for treatment, the patient has consented to  Procedure(s): EXPLORATORY LAPAROTOMY (N/A) LYSIS OF ADHESION (N/A) [place], RECTORECTUS VS TAR (N/A) as a surgical intervention.  The patient's history has been reviewed, patient examined, no change in status, stable for surgery.  I have reviewed the patient's chart and labs.  Questions were answered to the patient's satisfaction.     Ralene Ok

## 2019-10-15 NOTE — Anesthesia Postprocedure Evaluation (Signed)
Anesthesia Post Note  Patient: Louis [name]  Procedure(s) Performed: EXPLORATORY LAPAROTOMY (N/A Abdomen) LYSIS OF ADHESION (N/A Abdomen) HERNIA REPAIR INCISIONAL WITH MESH, TAR (N/A Abdomen)     Patient location during evaluation: PACU Anesthesia Type: General Level of consciousness: awake and alert Pain management: pain level controlled Vital Signs Assessment: post-procedure vital signs reviewed and stable Respiratory status: spontaneous breathing, nonlabored ventilation, respiratory function stable and patient connected to nasal cannula oxygen Cardiovascular status: blood pressure returned to baseline and stable Postop Assessment: no apparent nausea or vomiting Anesthetic complications: no   No complications documented.  Last Vitals:  Vitals:   10/15/19 1200 10/15/19 1557  BP: 118/66 129/79  Pulse: 61 78  Resp: 18 16  Temp: 36.4 C 36.6 C  SpO2: 100% 98%    Last Pain:  Vitals:   10/15/19 1557  TempSrc: Oral  PainSc:                  Effie Berkshire

## 2019-10-15 NOTE — Discharge Instructions (Signed)
[facility] _______[facility] ° °HERNIA REPAIR: POST OP INSTRUCTIONS ° °Always review your discharge instruction sheet given to you by the facility where your surgery was performed. °IF YOU HAVE DISABILITY OR FAMILY LEAVE FORMS, YOU MUST BRING THEM TO THE OFFICE FOR PROCESSING.   °DO NOT GIVE THEM TO YOUR DOCTOR. ° °1. A  prescription for pain medication may be given to you upon discharge.  Take your pain medication as prescribed, if needed.  If narcotic pain medicine is not needed, then you may take acetaminophen (Tylenol) or ibuprofen (Advil) as needed. °2. Take your usually prescribed medications unless otherwise directed. °If you need a refill on your pain medication, please contact your pharmacy.  They will contact our office to request authorization. Prescriptions will not be filled after 5 pm or on week-ends. °3. You should follow a light diet the first 24 hours after arrival home, such as soup and crackers, etc.  Be sure to include lots of fluids daily.  Resume your normal diet the day after surgery. °4.Most patients will experience some swelling and bruising around the umbilicus or in the groin and scrotum.  Ice packs and reclining will help.  Swelling and bruising can take several days to resolve.  °6. It is common to experience some constipation if taking pain medication after surgery.  Increasing fluid intake and taking a stool softener (such as Colace) will usually help or prevent this problem from occurring.  A mild laxative (Milk of Magnesia or Miralax) should be taken according to package directions if there are no bowel movements after 48 hours. °7. Unless discharge instructions indicate otherwise, you may remove your bandages 24-48 hours after surgery, and you may shower at that time.  You may have steri-strips (small skin tapes) in place directly over the incision.  These strips should be left on the skin for 7-10 days.  If your surgeon used skin glue on the incision, you may shower in  24 hours.  The glue will flake off over the next 2-3 weeks.  Any sutures or staples will be removed at the office during your follow-up visit. °8. ACTIVITIES:  You may resume regular (light) daily activities beginning the next day--such as daily self-care, walking, climbing stairs--gradually increasing activities as tolerated.  You may have sexual intercourse when it is comfortable.  Refrain from any heavy lifting or straining until approved by your doctor. ° °a.You may drive when you are no longer taking prescription pain medication, you can comfortably wear a seatbelt, and you can safely maneuver your car and apply brakes. °b.RETURN TO WORK:   °_____________________________________________ ° °9.You should see your doctor in the office for a follow-up appointment approximately 2-3 weeks after your surgery.  Make sure that you call for this appointment within a day or two after you arrive home to insure a convenient appointment time. °10.OTHER INSTRUCTIONS: _________________________ °   _____________________________________ ° °WHEN TO CALL YOUR DOCTOR: °1. Fever over 101.0 °2. Inability to urinate °3. Nausea and/or vomiting °4. Extreme swelling or bruising °5. Continued bleeding from incision. °6. Increased pain, redness, or drainage from the incision ° °The clinic staff is available to answer your questions during regular business hours.  Please don’t hesitate to call and ask to speak to one of the nurses for clinical concerns.  If you have a medical emergency, go to the nearest emergency room or call 911.  A surgeon from [facility] is always on call at the hospital ° ° °[address], [address], [place], [place]  [address] ? ° [address] [address], , [place]   [address] °[phone] ? [phone] ? FAX [phone] °Web site: [email or web address] ° °

## 2019-10-15 NOTE — Op Note (Signed)
10/15/2019  9:10 AM  PATIENT:  Louis [name]  64 y.o. male  PRE-OPERATIVE DIAGNOSIS:  INCISIONAL HERNIA  POST-OPERATIVE DIAGNOSIS:  INCISIONAL HERNIA  PROCEDURE:  Procedure(s): EXPLORATORY LAPAROTOMY (N/A) LYSIS OF ADHESION x85min (N/A) HERNIA REPAIR INCISIONAL WITH MESH, BILATERAL TRANSVERSUS ABDOMINUS REPAIR (N/A)  SURGEON:  Surgeon(s) and Role:    Ralene Ok, MD - Primary  ASSISTANTS: Judyann Munson, RNFA   ANESTHESIA:   local, regional and general  EBL:  minimal   BLOOD ADMINISTERED:none  DRAINS: none   LOCAL MEDICATIONS USED:  BUPIVICAINE  and OTHER Exparil  SPECIMEN:  No Specimen  DISPOSITION OF SPECIMEN:  N/A  COUNTS:  YES  TOURNIQUET:  * No tourniquets in log *  DICTATION: .Dragon Dictation Findings: Patient had a epigastric incisional hernia.  Hernia was approximately 5 x 10 cm.  The mesh that was placed was a Prolene 15 x 20 cm piece of mesh.  The posterior layer was reapproximated using #1 PDS x2.  There was approximately 5 cm overlap circumferentially.  Details of procedure: After the patient was consented he was taken back to the operating room placed supine position bilateral SCDs in place. After appropriate antibiotics were confirmed timeout was called and all facts verified.  The superficial scar was dissected off the anterior abdominal wall. This was discarded. The Bovie cautery was used to maintain hemostasis and dissection was taken down to the anterior fascia midline. This was incised. The fascia was elevated up. The hernia sac was then bluntly penetrated. At this time the incision was extended inferiorly. There were adhesions to the hernia sac in the midline, and to the right portion of the abdomen.  These were lysed both with sharp and blunt dissection.  Less of adhesions was approximately 30 minutes. This allowed [place] to extend the incision inferiorly to the extent of the hernia as well as superiorly to the edge of the hernia  sac.  Once this was done the rectus sheath retracted medially. The inferior portion of the right rectus fascia was then incised with electrocautery.  I was able to dissect off the posterior rectus sheath and its entirety. This was done to the extent of the hernia. At this time a proximal 1 cm medial to the perforating vessels the transversus abdominis muscle was incised as was its fascia. We carried this dissection both inferiorly and superiorly, but was more extended superiorly secondary to the costal margins.  I was able to get and incise the transversus abdominis muscle and the diaphragm fibers were easily visualized.  I was able to bluntly extend the space cephalad over the diaphragm.  I took this dissection medially just underneath the sternum. At this time the transversus abdominis muscle was then dissected laterally in a blunt fashion. This easily dissected laterally. This allowed advancement of the peritoneum medially.  At this time proceeded to retract the left rectus sheath medially and the inferior portion of the rectus sheath was incised. There were minimal muscular adhesions in this area. This easily dissected away. Again approximately 1 cm medial to the perforating vessels to the transversus abdominis muscle was incised as was its fascia. Again we proceeded to dissect with a right angle both superiorly and inferiorly to release the transversus abdominis muscle.  I again connected the superior portion by transecting the transversus abdominis muscle superiorly.  Again I was able to visualize the diaphragm fibers.  I took the dissection medial and superiorly over the dome of the diaphragm.  I was able to connect  both the right and left side and the space retroexternally.  At this time proceeded to bluntly dissect away the transverse abdominis muscle from the underlying peritoneum laterally. Inferiorly I was able to incise the medial rectus sheath, keep in a peritoneal layer deep.  I extended this  just past the inferior portion of the hernia approximately 5 cm.  At this time using Allis clamps and peritoneum was brought to the midline. This was easily approximated in the midline. At this time #1 PDS suture was used in a running standard fashion reapproximate the peritoneum in the midline.  There was no tension on the midline peritoneal closure.  At this time 20 cc of Exparel were then mixed with 20 cc of saline.  This was then injected bilaterally at the edge of the muscle.  At this time a piece of 30 x 30 cm Ethicon Prolene mesh was then trimmed and placed in the preperitoneal space.  The space measured approximately 20 x 15 cm in size and the mesh was cut to this length.  0 Novafil sutures were used to tack the mesh to the fascial using Endo Close device 2 laterally. This was also done inferiorly portion of the hernia 2. The mesh lay flat and the tension was called laterally on each side.  Superiorly Vistaseal was used to adhere the mesh to the dome of the diaphragm as well as superiorly to the costal margin.  There were no stitches placed superiorly.  At this time the rectus fascia was reapproximated midline using #1 PDS in a standard running fashion. Again there was no tension on the fascia in the midline.  The skin was reapproximated using skin staples. The stab wounds were then dressed with Dermabond  Patient was awakened from general anesthesia and taken to recovery room stable condition.     PLAN OF CARE: Admit for overnight observation  PATIENT DISPOSITION:  PACU - hemodynamically stable.   Delay start of Pharmacological VTE agent (>24hrs) due to surgical blood loss or risk of bleeding: yes

## 2019-10-15 NOTE — Progress Notes (Signed)
Pt placed on cpap 

## 2019-10-15 NOTE — Transfer of Care (Signed)
Immediate Anesthesia Transfer of Care Note  Patient: Louis [name]  Procedure(s) Performed: EXPLORATORY LAPAROTOMY (N/A Abdomen) LYSIS OF ADHESION (N/A Abdomen) HERNIA REPAIR INCISIONAL WITH MESH, TAR (N/A Abdomen)  Patient Location: PACU  Anesthesia Type:General  Level of Consciousness: awake, alert  and patient cooperative  Airway & Oxygen Therapy: Patient Spontanous Breathing and Patient connected to face mask oxygen  Post-op Assessment: Report given to RN and Post -op Vital signs reviewed and stable  Post vital signs: Reviewed and stable  Last Vitals:  Vitals Value Taken Time  BP 128/77 10/15/19 0924  Temp    Pulse 71 10/15/19 0925  Resp 18 10/15/19 0925  SpO2 100 % 10/15/19 0925  Vitals shown include unvalidated device data.  Last Pain:  Vitals:   10/15/19 0631  TempSrc: Oral  PainSc:       Patients Stated Pain Goal: 2 (75/91/63 [id number])  Complications: No complications documented.

## 2019-10-15 NOTE — Anesthesia Procedure Notes (Signed)
Procedure Name: Intubation Date/Time: 10/15/2019 7:40 AM Performed by: Janace Litten, CRNA Pre-anesthesia Checklist: Patient identified, Emergency Drugs available, Suction available and Patient being monitored Patient Re-evaluated:Patient Re-evaluated prior to induction Oxygen Delivery Method: Circle system utilized Preoxygenation: Pre-oxygenation with 100% oxygen Induction Type: IV induction Ventilation: Mask ventilation without difficulty Laryngoscope Size: Mac and 4 Grade View: Grade I Tube type: Oral Tube size: 7.5 mm Number of attempts: 1 Airway Equipment and Method: Stylet and Oral airway Placement Confirmation: ETT inserted through vocal cords under direct vision,  positive ETCO2 and breath sounds checked- equal and bilateral Secured at: 23 cm Tube secured with: Tape Dental Injury: Teeth and Oropharynx as per pre-operative assessment  Comments: ETT inserted by [place]

## 2019-10-16 ENCOUNTER — Encounter (HOSPITAL_COMMUNITY): Payer: Self-pay | Admitting: General Surgery

## 2019-10-16 DIAGNOSIS — K432 Incisional hernia without obstruction or gangrene: Secondary | ICD-10-CM | POA: Diagnosis not present

## 2019-10-16 LAB — CBC
HCT: 35.3 % — ABNORMAL LOW (ref 39.0–52.0)
Hemoglobin: 11.6 g/dL — ABNORMAL LOW (ref 13.0–17.0)
MCH: 30.1 pg (ref 26.0–34.0)
MCHC: 32.9 g/dL (ref 30.0–36.0)
MCV: 91.5 fL (ref 80.0–100.0)
Platelets: 362 10*3/uL (ref 150–400)
RBC: 3.86 MIL/uL — ABNORMAL LOW (ref 4.22–5.81)
RDW: 13.2 % (ref 11.5–15.5)
WBC: 9.6 10*3/uL (ref 4.0–10.5)
nRBC: 0 % (ref 0.0–0.2)

## 2019-10-16 MED ORDER — TRAMADOL HCL 50 MG PO TABS
50.0000 mg | ORAL_TABLET | Freq: Four times a day (QID) | ORAL | 0 refills | Status: DC | PRN
Start: 2019-10-16 — End: 2019-12-18

## 2019-10-16 MED ORDER — METHOCARBAMOL 500 MG PO TABS: 500.0000 mg | ORAL_TABLET | Freq: Four times a day (QID) | ORAL | 0 refills | Status: DC | PRN

## 2019-10-16 NOTE — Discharge Summary (Signed)
Physician Discharge Summary  [name] MRN: [id number] DOB/AGE: [birth year] 62 y.o.  Admit date: 10/15/2019 Discharge date: 10/16/2019  Admission Diagnoses:incisional hernia  Discharge Diagnoses:  Active Problems:   S/P hernia repair   Discharged Condition: good  Hospital Course: patient underwent exploratory laparoscopy, incisional herniarepair withMesh.  Please see operative note for full details.  Postoperatively patient placed on his recovery post op orders.  Patient was doing well with minimal pain.  He was otherwise tolerating a regular diet and ambulate well on his own.  Patient was otherwise afebrile, didn't feel for discharge and discharged home.    Consults: None  Significant Diagnostic Studies: None  Treatments: surgery: as above  Discharge Exam: Blood pressure 128/77, pulse 76, temperature 97.8 F (36.6 C), temperature source Oral, resp. rate 18, height 5\' 11"  (1.803 m), weight 88.5 kg, SpO2 100 %. General appearance: alert and cooperative GI: soft, non-tender; bowel sounds normal; no masses,  no organomegaly  Disposition: Discharge disposition: 01-Home or Self Care       Discharge Instructions     Diet - low sodium heart healthy   Complete by: As directed    Increase activity slowly   Complete by: As directed       Allergies as of 10/16/2019       Reactions   Accupril [quinapril Hcl] Cough   Nsaids    Perforated gastric ulcer        Medication List     TAKE these medications    acetaminophen 500 MG tablet Commonly known as: TYLENOL Take 500 mg by mouth every 6 (six) hours as needed for mild pain.   ALPRAZolam 0.5 MG tablet Commonly known as: XANAX Take 1 tablet (0.5 mg total) by mouth daily as needed (essential tremor).   amLODipine 5 MG tablet Commonly known as: NORVASC TAKE 1 TABLET(5 MG) BY MOUTH DAILY What changed: See the new instructions.   cyanocobalamin 1000 MCG tablet Take 1 tablet (1,000 mcg  total) by mouth daily. Available over the counter What changed: additional instructions   docusate sodium 100 MG capsule Commonly known as: COLACE Take 1 capsule (100 mg total) by mouth 2 (two) times daily as needed for mild constipation. Available over the counter What changed:  when to take this additional instructions   feeding supplement (PRO-STAT SUGAR FREE 64) Liqd Take 30 mLs by mouth 3 (three) times daily with meals. What changed: when to take this   folic acid 1 MG tablet Commonly known as: FOLVITE Take 1 tablet (1 mg total) by mouth daily.   gabapentin 300 MG capsule Commonly known as: NEURONTIN Take 1 capsule (300 mg total) by mouth 2 (two) times daily.   losartan 100 MG tablet Commonly known as: COZAAR Take 1 tablet (100 mg total) by mouth daily.   magnesium oxide 400 (241.3 Mg) MG tablet Commonly known as: MAG-OX Take 1 tablet (400 mg total) by mouth with breakfast, with lunch, and with evening meal. What changed: when to take this   methocarbamol 500 MG tablet Commonly known as: ROBAXIN Take 1 tablet (500 mg total) by mouth every 6 (six) hours as needed for muscle spasms.   metoprolol succinate 50 MG 24 hr tablet Commonly known as: TOPROL-XL Take 1 tablet (50 mg total) by mouth daily. Take with a meal.   multivitamin with minerals Tabs tablet Take 1 tablet by mouth daily.   omeprazole 20 MG capsule Commonly known as: PRILOSEC Take 20 mg by mouth daily.   thiamine  100 MG tablet Take 1 tablet (100 mg total) by mouth daily. Available over the counter What changed: additional instructions   traMADol 50 MG tablet Commonly known as: ULTRAM Take 1 tablet (50 mg total) by mouth every 6 (six) hours as needed for moderate pain.        Follow-up Information     Ralene Ok, MD. Schedule an appointment as soon as possible for a visit in 2 weeks.   Specialty: General Surgery Why: Post op visit Contact information: [address] [place] [address] [phone]                 Signed: Ralene Ok 10/16/2019, 8:30 AM

## 2019-10-16 NOTE — Progress Notes (Signed)
Patient alert and oriented, mae's well, voiding adequate amount of urine, swallowing without difficulty, no c/o pain at time of discharge. Patient discharged home with family. Script and discharged instructions given to patient. Patient and family stated understanding of instructions given. Patient has an appointment with Dr. Ramirez  

## 2019-10-20 ENCOUNTER — Other Ambulatory Visit: Payer: Self-pay | Admitting: Physical Medicine and Rehabilitation

## 2019-10-23 ENCOUNTER — Other Ambulatory Visit: Payer: Self-pay | Admitting: Physical Medicine and Rehabilitation

## 2019-10-26 ENCOUNTER — Other Ambulatory Visit: Payer: Self-pay | Admitting: Neurology

## 2019-10-26 MED ORDER — GABAPENTIN 300 MG PO CAPS
300.0000 mg | ORAL_CAPSULE | Freq: Two times a day (BID) | ORAL | 3 refills | Status: DC
Start: 2019-10-26 — End: 2020-11-10

## 2019-11-04 ENCOUNTER — Other Ambulatory Visit: Payer: Self-pay

## 2019-11-04 ENCOUNTER — Ambulatory Visit (INDEPENDENT_AMBULATORY_CARE_PROVIDER_SITE_OTHER): Payer: 59 | Admitting: Acute Care

## 2019-11-04 ENCOUNTER — Encounter: Payer: Self-pay | Admitting: Acute Care

## 2019-11-04 VITALS — BP 120/60 | HR 65 | Temp 97.6°F | Ht 71.0 in | Wt 201.4 lb

## 2019-11-04 DIAGNOSIS — G4733 Obstructive sleep apnea (adult) (pediatric): Secondary | ICD-10-CM

## 2019-11-04 DIAGNOSIS — Z9989 Dependence on other enabling machines and devices: Secondary | ICD-10-CM | POA: Diagnosis not present

## 2019-11-04 NOTE — Patient Instructions (Addendum)
It is good to see you today. We will increase your pressure to 16 cm H2O.  Follow up in 2 months with Down Load We will order another O&O to see if you still need night time oxygen  Wear CPAP but no oxygen when you are doing the study.  You will get a call to schedule Try sleeping on your side more if possible Continue on CPAP at bedtime. You appear to be benefiting from the treatment  Goal is to wear for at least 6 hours each night for maximal clinical benefit. Continue to work on weight loss, as the link between excess weight  and sleep apnea is well established.   Remember to establish a good bedtime routine, and work on sleep hygiene.  Limit daytime naps , avoid stimulants such as caffeine and nicotine close to bedtime, exercise daily to promote sleep quality, avoid heavy , spicy, fried , or rich foods before bed. Ensure adequate exposure to natural light during the day,establish a relaxing bedtime routine with a pleasant sleep environment ( Bedroom between 60 and 67 degrees, turn off bright lights , TV or device screens screens , consider black out curtains or white noise machines) Do not drive if sleepy. Remember to clean mask, tubing, filter, and reservoir once weekly with soapy water.  Follow up with [name] or Judson Roch NP   In 2 months  or before as needed. Please contact office for sooner follow up if symptoms do not improve or worsen or seek emergency care

## 2019-11-04 NOTE — Progress Notes (Signed)
History of Present Illness Louis [name] is a 64 y.o. male with OSA on CPAP with nocturnal oxygen. He is followed by Dr. Karle Plumber [name] a 64 y.o.malewith PMH significant for HTN, CHF, COPD, sleep apneaon CPAP,and scoliosis with recent hospitalization 06/25/2019-7/17/2021with sepsis 2/2Enterococcusin his blood and urine. Additionally he required intubation 6/30-7/1 for hypercarbic respiratory failure/ HAP prior to admission to inpatient rehab 7/6-7/17. The patient was treated by Dr. Lake [name] and Dr. Ander [name] as an inpatient.He is followed by Dr. Elsworth [name] for his OSA as an outpatient.  On set pressure of 11, patient had an AHI of  11.3. Made change to  Auto-CPAP of 5-15 cm, >> pt did not like this mode.  Changed back to a set pressure of 13 cm H2O >> AHI dropped to 9.5 Pt. Wanted to see a lower number, he has lost weight, has ordered a full face mask.  Pt requested adjustment to 15 cm set pressure. >> Advised that if this pressure  is too high to revert to 13 cm H2O and we would do a follow up CPAP titration to assess for optimal pressures.  Pt returns today for down load and assessment of new mask and tolerance of 15 cm H2O pressure settings.      11/04/2019  Pt. Presents for follow up of CPAP therapy with  set pressure of 15 cm pressure. [name] He states he has been compliant with his CPAP therapy. He denies any morning  headaches or daytime sleepiness. No issues with mask leak or therapy in general.   He has had surgery to treat an abdominal surgery. He states he really wants to get his AHI to less than 5. He states he is sleeping well. He does not snore. He has no daytime sleepiness. Wants to do an overnight oximetry to see if he still needs oxygen at bedtime as he has lost a great deal of weight. ( 27 pounds) . He and his wife would like to travel, and the oxygen limits their ability to do this.   We discussed that his AHI has come from 39 to 7.8, and that proportionally  this is a significant improvement. He feels good, has no daytime sleepiness, and his echo has actually improved, no longer shows diastolic dysfunction. We discussed that getting to 5 may not be a realistic number.    Down Load reveals AHI of 7.8         Test Results: June 25, 2021TTE: LVEF 55 to 61%, RV systolic function mildly reduced, aortic dilation, valves normal as visualized June 28 PJK:DTOI normal, RV systolic function normal, mitral and aortic valves normal 6/24 SARS COV 2 > negative 6/24 blood > enterococcus faecalis amp sensitive 6/24 urine culture  6/26 blood > negative  His admission for enterococcal bacteremia-complicated by hypercarbic respiratory failure .  NPSG AHI 39/h, >> CPAP 9 cm + 2L o2  Split Night Study 08/2017>>DIAGNOSIS  - Obstructive Sleep Apnea (327.23 [G47.33 ICD-10])  - Nocturnal Hypoxemia (327.26 [G47.36 ICD-10])  RECOMMENDATIONS  - Trial of CPAP therapy on 9 cm H2O with a Medium size  [employer] Full Face Mask Simplus mask and heated  humidification.  - 2L O2 should be blended into CPAP   PFT's 08/15/2017>>suggestive extraparenchymal restriction, He has moderate airway obstruction, FEV1 58%/1.87, F/F ratio of 64 pre BD  CBC Latest Ref Rng & Units 10/16/2019 10/08/2019 08/03/2019  WBC 4.0 - 10.5 K/uL 9.6 7.4 8.1  Hemoglobin 13.0 - 17.0 g/dL 11.6(L) 12.0(L) 10.4(A)  Hematocrit 39 - 52 % 35.3(L) 38.3(L) 32.4  Platelets 150 - 400 K/uL 362 374 -    BMP Latest Ref Rng & Units 10/08/2019 07/21/2019 07/13/2019  Glucose 70 - 99 mg/dL 96 96 99  BUN 8 - 23 mg/dL 20 12 14   Creatinine 0.61 - 1.24 mg/dL 0.85 0.88 1.11  BUN/Creat Ratio 10 - 24 - 14 -  Sodium 135 - 145 mmol/L 136 135 135  Potassium 3.5 - 5.1 mmol/L 4.0 4.4 4.0  Chloride 98 - 111 mmol/L 99 99 100  CO2 22 - 32 mmol/L 29 25 24   Calcium 8.9 - 10.3 mg/dL 9.8 9.2 9.3    BNP    Component Value Date/Time   BNP 229.1 (H) 06/25/2019 1217    ProBNP No results found for:  PROBNP  PFT    Component Value Date/Time   FEV1PRE 1.87 08/15/2017 1002   FEV1POST 1.97 08/15/2017 1002   FVCPRE 2.90 08/15/2017 1002   FVCPOST 2.79 08/15/2017 1002   TLC 5.72 08/15/2017 1002   DLCOUNC 27.00 08/15/2017 1002   PREFEV1FVCRT 64 08/15/2017 1002   PSTFEV1FVCRT 71 08/15/2017 1002    DG Bone Density  Result Date: 10/12/2019 EXAM: DUAL X-RAY ABSORPTIOMETRY (DXA) FOR BONE MINERAL DENSITY IMPRESSION: Referring Physician:  Rutherford [name] Your patient completed a BMD test using Lunar IDXA DXA system ( analysis version: 16 ) manufactured by [employer]. Technologist: Louis [name] PATIENT: Name: Louis, [name] Patient ID: [id number] Birth Date: Feb 13, 1955 Height: 69.5 in. Sex: Male Measured: 10/12/2019 Weight: 197.8 lbs. Indications: Caucasian, COPD, Gabapentin, Height Loss (781.91), Omeprazole, scoliosis Fractures: Foot Treatments: Multivitamin ASSESSMENT: The BMD measured at Femur Neck Left is 0.904 g/cm2 with a T-score of -1.0. This patient is considered Normal according to [facility]) criteria. The scan quality is satisfactory. Note that the lumbar spine BMD is falsely elevated by degenerative changes. Site Region Measured Date Measured Age YA BMD Significant CHANGE T-score DualFemur Neck Left  10[age]12/2019    62.2         -1.0    0.904 g/cm2 AP Spine  L1-L4      10[age]12[age]2019    62.2         1.0     1.300 g/cm2 DualFemur Total Mean 10[age]12/2019    62.2         0.0     1.002 g/cm2 World Health Organization [facility]) criteria for post-menopausal, Caucasian Women: Normal       T-score at or above -1 SD Osteopenia   T-score between -1 and -2.5 SD Osteoporosis T-score at or below -2.5 SD RECOMMENDATION: 1. All patients should optimize calcium and vitamin D intake. 2. Consider FDA approved medical therapies in postmenopausal women and men aged 53 years and older, based on the following: a. A hip or vertebral (clinical or morphometric) fracture b. T- score < or = -2.5 at the femoral neck or spine  after appropriate evaluation to exclude secondary causes c. Low bone mass (T-score between -1.0 and -2.5 at the femoral neck or spine) and a 10 year probability of a hip fracture > or = 3% or a 10 year probability of a major osteoporosis-related fracture > or = 20% based on the [place]-adapted WHO algorithm d. Clinician judgment and/or patient preferences may indicate treatment for people with 10-year fracture probabilities above or below these levels FOLLOW-UP: Patients with diagnosis of osteoporosis or at high risk for fracture should have regular bone mineral density tests. For patients eligible for Medicare, routine testing is  allowed once every 2 years. The testing frequency can be increased to one year for patients who have rapidly progressing disease, those who are receiving or discontinuing medical therapy to restore bone mass, or have additional risk factors. Electronically Signed   By: Marlaine Hind M.D.   On: 10/12/2019 08:23     Past medical hx Past Medical History:  Diagnosis Date  . Anemia   . Anxiety    situational  . BPH (benign prostatic hyperplasia)    on flomax- Patien tand wife denies- and patient has never been on Flomax  . Bundle branch block    per prior PCP records  . CHF (congestive heart failure) ([place])   . Chronic kidney disease    Acute Renal Failure- June 2021- admission- kidneys recovered quickly  . Complication of anesthesia 03/11/2019   woke up before surgery was over- "paralytic"  . COPD (chronic obstructive pulmonary disease) ([place])   . Diverticulosis    by colonoscopy  . Eczema    per prior PCP records  . Elevated PSA 2015   peaked 6s, s/p benign biopsy 2014, sees urology [name] ([place])  . Essential tremor   . GERD (gastroesophageal reflux disease)    per prior PCP records  . History of panic attacks    per prior PCP records  . HTN (hypertension)   . Mild intermittent asthma in adult without complication   . Obesity, Class I, BMI 30-34.9   .  Osteoporosis    DEXA 06/2013 with osteopenia - h/o ?wrist/hip fracture from AVN from chronic steroid use (asthma), took reclast for 1 year  . Pneumonia    10/08/19 >10 years ago  . Seasonal allergies   . Sleep apnea   . Thoracic scoliosis childhood  . Transaminitis    per prior PCP records  . Vitamin B 12 deficiency      Social History   Tobacco Use  . Smoking status: Never Smoker  . Smokeless tobacco: Never Used  Vaping Use  . Vaping Use: Never used  Substance Use Topics  . Alcohol use: Not Currently  . Drug use: No    Mr.Sukhu reports that he has never smoked. He has never used smokeless tobacco. He reports previous alcohol use. He reports that he does not use drugs.  Tobacco Cessation: Never smoker  Past surgical hx, Family hx, Social hx all reviewed.  Current Outpatient Medications on File Prior to Visit  Medication Sig  . acetaminophen (TYLENOL) 500 MG tablet Take 500 mg by mouth every 6 (six) hours as needed for mild pain.  [name] ALPRAZolam (XANAX) 0.5 MG tablet Take 1 tablet (0.5 mg total) by mouth daily as needed (essential tremor).  . Amino Acids-Protein Hydrolys (FEEDING SUPPLEMENT, PRO-STAT SUGAR FREE 64,) LIQD Take 30 mLs by mouth 3 (three) times daily with meals. (Patient taking differently: Take 30 mLs by mouth in the morning and at bedtime. )  . amLODipine (NORVASC) 5 MG tablet TAKE 1 TABLET(5 MG) BY MOUTH DAILY (Patient taking differently: Take 5 mg by mouth daily. )  . cyanocobalamin 1000 MCG tablet Take 1 tablet (1,000 mcg total) by mouth daily. Available over the counter (Patient taking differently: Take 1,000 mcg by mouth daily. )  . docusate sodium (COLACE) 100 MG capsule Take 1 capsule (100 mg total) by mouth 2 (two) times daily as needed for mild constipation. Available over the counter (Patient taking differently: Take 100 mg by mouth 2 (two) times daily. )  . folic acid (FOLVITE) 1 MG  tablet Take 1 tablet (1 mg total) by mouth daily.  [name] gabapentin  (NEURONTIN) 300 MG capsule Take 1 capsule (300 mg total) by mouth 2 (two) times daily.  [name] losartan (COZAAR) 100 MG tablet Take 1 tablet (100 mg total) by mouth daily.  . magnesium oxide (MAG-OX) 400 (241.3 Mg) MG tablet Take 1 tablet (400 mg total) by mouth with breakfast, with lunch, and with evening meal. (Patient taking differently: Take 400 mg by mouth daily. )  . methocarbamol (ROBAXIN) 500 MG tablet Take 1 tablet (500 mg total) by mouth every 6 (six) hours as needed for muscle spasms.  . metoprolol succinate (TOPROL-XL) 50 MG 24 hr tablet Take 1 tablet (50 mg total) by mouth daily. Take with a meal.  . Multiple Vitamin (MULTIVITAMIN WITH MINERALS) TABS tablet Take 1 tablet by mouth daily.  [name] omeprazole (PRILOSEC) 20 MG capsule Take 20 mg by mouth daily.  [name] thiamine 100 MG tablet Take 1 tablet (100 mg total) by mouth daily. Available over the counter (Patient taking differently: Take 100 mg by mouth daily. )  . traMADol (ULTRAM) 50 MG tablet Take 1 tablet (50 mg total) by mouth every 6 (six) hours as needed for moderate pain.   No current facility-administered medications on file prior to visit.     Allergies  Allergen Reactions  . Accupril [Quinapril Hcl] Cough  . Nsaids     Perforated gastric ulcer    Review Of Systems:  Constitutional:   No  weight loss, night sweats,  Fevers, chills, fatigue, or  lassitude.  HEENT:   No headaches,  Difficulty swallowing,  Tooth/dental problems, or  Sore throat,                No sneezing, itching, ear ache, nasal congestion, post nasal drip,   CV:  No chest pain,  Orthopnea, PND, swelling in lower extremities, anasarca, dizziness, palpitations, syncope.   GI  No heartburn, indigestion, abdominal pain, nausea, vomiting, diarrhea, change in bowel habits, loss of appetite, bloody stools.   Resp: No shortness of breath with exertion or at rest.  No excess mucus, no productive cough,  No non-productive cough,  No coughing up of blood.  No change in  color of mucus.  No wheezing.  No chest wall deformity  Skin: no rash or lesions.  GU: no dysuria, change in color of urine, no urgency or frequency.  No flank pain, no hematuria   MS:  No joint pain or swelling.  No decreased range of motion.  No back pain.  Psych:  No change in mood or affect. No depression or anxiety.  No memory loss.   Vital Signs BP 120/60 (BP Location: Left Arm, Cuff Size: Normal)   Pulse 65   Temp 97.6 F (36.4 C) (Oral)   Ht 5\' 11"  (1.803 m)   Wt 201 lb 6.4 oz (91.4 kg)   SpO2 99%   BMI 28.09 kg/m    Physical Exam:  General- No distress,  A&Ox3, pleasant ENT: No sinus tenderness, TM clear, pale nasal mucosa, no oral exudate,no post nasal drip, no LAN Cardiac: S1, S2, regular rate and rhythm, no murmur Chest: No wheeze/ rales/ dullness; no accessory muscle use, no nasal flaring, no sternal retractions Abd.: Soft Non-tender, ND, BS +, Body mass index is 28.09 kg/m. Ext: No clubbing cyanosis, edema Neuro:  normal strength, MAE x 4, A&O x 3 Skin: No rashes, No lesions,warm and dry Psych: normal mood and behavior   Assessment/Plan  OSA on CPAP Currently on 15 cm H2O with AHI of 7.8 Plan  We will increase your pressure to 16 cm H2O.  Follow up in 2 months with Down Load We will order another O&O to see if you still need night time oxygen  Wear CPAP but no oxygen when you are doing the study.  You will get a call to schedule Try sleeping on your side more if possible Continue on CPAP at bedtime. You appear to be benefiting from the treatment  Goal is to wear for at least 6 hours each night for maximal clinical benefit. Continue to work on weight loss, as the link between excess weight  and sleep apnea is well established.   Remember to establish a good bedtime routine, and work on sleep hygiene.  Limit daytime naps , avoid stimulants such as caffeine and nicotine close to bedtime, exercise daily to promote sleep quality, avoid heavy , spicy, fried  , or rich foods before bed. Ensure adequate exposure to natural light during the day,establish a relaxing bedtime routine with a pleasant sleep environment ( Bedroom between 60 and 67 degrees, turn off bright lights , TV or device screens screens , consider black out curtains or white noise machines) Do not drive if sleepy. Remember to clean mask, tubing, filter, and reservoir once weekly with soapy water.  Follow up with Dr. Elsworth [name] or Judson Roch NP   In 2 months  or before as needed. Please contact office for sooner follow up if symptoms do not improve or worsen or seek emergency care      This appointment was 30 min long with over 50% of the time in direct face-to-face patient care, assessment, plan of care, and follow-up.   Magdalen Spatz, NP 11/04/2019  3:40 PM

## 2019-11-05 ENCOUNTER — Ambulatory Visit: Payer: 59 | Admitting: Family Medicine

## 2019-11-06 ENCOUNTER — Ambulatory Visit: Payer: 59

## 2019-11-09 NOTE — Telephone Encounter (Signed)
[name], Please see patient comment regarding CPAP and advise.  Thank you.

## 2019-11-10 NOTE — Telephone Encounter (Signed)
Message sent from triage to [place], NP    I spoke with someone at [facility], and she said that they changed cpap pressure remotely 10/09/19.  She stated that someone tried to reach out to Patient, but were unable to speak with anyone, but let a message on [name] voice mail.   Judson Roch, NP please advise

## 2019-11-12 ENCOUNTER — Ambulatory Visit: Payer: 59 | Admitting: Family Medicine

## 2019-11-19 ENCOUNTER — Encounter: Payer: Self-pay | Admitting: Nurse Practitioner

## 2019-11-25 ENCOUNTER — Telehealth: Payer: Self-pay | Admitting: Family Medicine

## 2019-11-25 ENCOUNTER — Other Ambulatory Visit: Payer: Self-pay | Admitting: Family Medicine

## 2019-11-25 DIAGNOSIS — D539 Nutritional anemia, unspecified: Secondary | ICD-10-CM

## 2019-11-25 NOTE — Telephone Encounter (Signed)
folic acid (FOLVITE) 1 MG tablet [[id number]]   Pt called and stated that phar,acy called this in last week to be refilled. Pt is needing this refilled. Please advise.

## 2019-11-30 NOTE — Telephone Encounter (Signed)
[name] please advise on patient mychart message   Hi,   Have you received the result from the pulse oxymeter test I took recently? If so, could you make the data available to me in [place] and recommendations? Thanks!

## 2019-12-18 ENCOUNTER — Other Ambulatory Visit: Payer: Self-pay

## 2019-12-18 ENCOUNTER — Encounter: Payer: Self-pay | Admitting: Pulmonary Disease

## 2019-12-18 ENCOUNTER — Ambulatory Visit (INDEPENDENT_AMBULATORY_CARE_PROVIDER_SITE_OTHER): Payer: 59 | Admitting: Pulmonary Disease

## 2019-12-18 DIAGNOSIS — J9611 Chronic respiratory failure with hypoxia: Secondary | ICD-10-CM

## 2019-12-18 DIAGNOSIS — J9612 Chronic respiratory failure with hypercapnia: Secondary | ICD-10-CM | POA: Diagnosis not present

## 2019-12-18 DIAGNOSIS — G4733 Obstructive sleep apnea (adult) (pediatric): Secondary | ICD-10-CM

## 2019-12-18 NOTE — Assessment & Plan Note (Signed)
Review of download on CPAP 15 cm, EPR 2, excellent compliance, large leak, residual AHI 5/hour, and individual nights this may be higher as high as 10/hour. He has tried AirFit F30 full facemask but leak still persist. I asked him to trial cloth liners with his mask to see if he can get a better seal.  He is very compliant and CPAP is certainly helped improve his daytime somnolence and fatigue.  I think his weight loss is also help.  His residual events are acceptable at this time   Weight loss encouraged, compliance with goal of at least 4-6 hrs every night is the expectation. Advised against medications with sedative side effects Cautioned against driving when sleepy - understanding that sleepiness will vary on a day to day basis

## 2019-12-18 NOTE — Progress Notes (Signed)
Subjective:    [name], male    DOB: 14-Nov-1955, 64 y.o.   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, pastorfor FU of OSA & restrictive lung disease due to scoliosis   He was hospitalized with hypoxia &hypercarbiaIn 7/2019after trip to [place] PMH - Had severe asthma as a child but did not bother him as an adult, PFTs have shown moderate airway obstruction , has scoliosis but no significant restriction with normal [facility] admission 06/2019  for enterococcal bacteremia , TEE negative-complicated by hypercarbic respiratory failure requiring mechanical ventilation for 1 day On hospital follow-up, he had residual events on his latest download on CPAP of 11 cm pressure was gradually increased to 15 cm Download on 15 cm 11/3 still showed residual AHI of 8/hour. ONO on CPAP/room air showed desaturation less than 88% for about 8 minutes  He underwent ventral hernia repair in October He has lost 20 pounds to his current weight of 200 Breathing is good, no wheezing or coughing. They are planning a trip to the [place] next summer and wonders if he needs to take oxygen with him.  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 Echo 08/3417 grade 2 diastolic dysfunction, normal LV function, decreased RV function but normal PA pressures EKG-right axis deviation 07/2017 ABG7.31/72/71 on 4 L oxygen  NPSG AHI 39/h, >> CPAP 9 cm + 2L o2  PFTs 08/2017>>postbronchodilator ratio 71, FEV1 61% with FVC 65%, no significantbronchodilator response, TLC 90%, DLCO 96% suggestive extraparenchymal restriction  Past Medical History:  Diagnosis Date  . Anemia   . Anxiety    situational  . BPH (benign prostatic hyperplasia)    on flomax- Patien tand wife denies- and patient has never been on Flomax  . Bundle branch block    per prior PCP records   . CHF (congestive heart failure) ([place])   . Chronic kidney disease    Acute Renal Failure- June 2021- admission- kidneys recovered quickly  . Complication of anesthesia 03/11/2019   woke up before surgery was over- "paralytic"  . COPD (chronic obstructive pulmonary disease) ([place])   . Diverticulosis    by colonoscopy  . Eczema    per prior PCP records  . Elevated PSA 2015   peaked 6s, s/p benign biopsy 2014, sees urology [name] ([place])  . Essential tremor   . GERD (gastroesophageal reflux disease)    per prior PCP records  . History of panic attacks    per prior PCP records  . HTN (hypertension)   . Mild intermittent asthma in adult without complication   . Obesity, Class I, BMI 30-34.9   . Osteoporosis    DEXA 06/2013 with osteopenia - h/o ?wrist/hip fracture from AVN from chronic steroid use (asthma), took reclast for 1 year  . Pneumonia    10/08/19 >10 years ago  . Seasonal allergies   . Sleep apnea   . Thoracic scoliosis childhood  . Transaminitis    per prior PCP records  . Vitamin B 12 deficiency      Review of Systems neg for any significant sore throat, dysphagia, itching, sneezing, nasal congestion or excess/ purulent secretions, fever, chills, sweats, unintended wt loss, pleuritic or exertional cp, hempoptysis, orthopnea pnd or change in chronic leg swelling. Also denies presyncope, palpitations, heartburn, abdominal pain, nausea, vomiting, diarrhea or change in bowel or urinary habits, dysuria,hematuria, rash, arthralgias, visual complaints, headache,  numbness weakness or ataxia.     Objective:   Physical Exam  Gen. Pleasant, obese, in no distress, normal affect ENT - no pallor,icterus, no post nasal drip, class 2-3 airway Neck: No JVD, no thyromegaly, no carotid bruits Lungs: no use of accessory muscles, no dullness to percussion, decreased without rales or rhonchi  Cardiovascular: Rhythm regular, heart sounds  normal, no murmurs or gallops, no peripheral  edema Abdomen: soft and non-tender, no hepatosplenomegaly, BS normal. Musculoskeletal: No deformities, no cyanosis or clubbing Neuro:  alert, non focal, no tremors       Assessment & Plan:

## 2019-12-18 NOTE — Patient Instructions (Signed)
  CPAP 15 cm is working well Trial off oxygen x 3 nights prior to next visit Trial of REM zzz cloth liner

## 2019-12-18 NOTE — Assessment & Plan Note (Signed)
Last oximetry was reviewed, minimal desaturation, he can perhaps trial off oxygen but given that he had 2 episodes of hypoxic respiratory failure, we will continue his nocturnal oxygen. The reason for his hypercarbic respiratory failure is unclear to me, he does not seem to have significant restriction on his PFTs scoliosis does not appear to be severe.  ABG 01/2019 does not reveal hypercarbia, so I do not think that he needs BiPAP.  We will continue CPAP for now

## 2020-01-06 ENCOUNTER — Encounter: Payer: Self-pay | Admitting: Orthopaedic Surgery

## 2020-01-06 ENCOUNTER — Other Ambulatory Visit: Payer: Self-pay

## 2020-01-06 ENCOUNTER — Ambulatory Visit (INDEPENDENT_AMBULATORY_CARE_PROVIDER_SITE_OTHER): Payer: BC Managed Care – PPO | Admitting: Orthopaedic Surgery

## 2020-01-06 ENCOUNTER — Ambulatory Visit: Payer: Self-pay

## 2020-01-06 VITALS — Ht 71.0 in | Wt 198.0 lb

## 2020-01-06 DIAGNOSIS — M25561 Pain in right knee: Secondary | ICD-10-CM

## 2020-01-06 DIAGNOSIS — M1711 Unilateral primary osteoarthritis, right knee: Secondary | ICD-10-CM | POA: Diagnosis not present

## 2020-01-06 NOTE — Progress Notes (Signed)
Office Visit Note   Patient: Louis [name]           Date of Birth: [birth year]           MRN: [id number] Visit Date: 01/06/2020              Requested by: Shade Flood, MD [address] [place],  [place] [address] PCP: Shade Flood, MD   Assessment & Plan: Visit Diagnoses:  1. Acute pain of right knee   2. Unilateral primary osteoarthritis, right knee     Plan: [place] has evidence of medial compartment osteoarthritis right knee with a positive effusion.  Long discussion regarding use of heat and ice and may be even Voltaren gel.  Has had a prior perforated ulcer and cannot use NSAIDs.  Would consider cortisone injection if no improvement.  Answered all questions  Follow-Up Instructions: Return if symptoms worsen or fail to improve.   Orders:  Orders Placed This Encounter  Procedures  . XR KNEE 3 VIEW RIGHT   No orders of the defined types were placed in this encounter.     Procedures: No procedures performed   Clinical Data: No additional findings.   Subjective: Chief Complaint  Patient presents with  . Right Knee - Pain  Patient presents with right medial knee pain and swelling x 10 days. He has been exercising, but does not recall a specific injury. He describes the knee having a little bit of a mobility decline and some stiffness. He denies locking, popping, or giving way.  He takes gabapentin routinely for neuropathy which has helped the knee pain.  He is not supposed to take NSAID's with a prior history of a perforated duodenal ulcer  HPI  Review of Systems   Objective: Vital Signs: Ht 5\' 11"  (1.803 m)   Wt 198 lb (89.8 kg)   BMI 27.62 kg/m   Physical Exam Constitutional:      Appearance: He is well-developed and well-nourished.  HENT:     Mouth/Throat:     Mouth: Oropharynx is clear and moist.  Eyes:     Extraocular Movements: EOM normal.     Pupils: Pupils are equal, round, and reactive to light.  Pulmonary:     Effort: Pulmonary  effort is normal.  Skin:    General: Skin is warm and dry.  Neurological:     Mental Status: He is alert and oriented to person, place, and time.  Psychiatric:        Mood and Affect: Mood and affect normal.        Behavior: Behavior normal.     Ortho Exam right knee with small effusion.  There is increased varus with mild medial joint pain.  Full extension and flexion over 105 degrees.  No instability.  No popliteal pain.  No calf pain. Specialty Comments:  No specialty comments available.  Imaging: XR KNEE 3 VIEW RIGHT  Result Date: 01/06/2020 Films of the right knee were obtained in several projections standing.  There is some mild narrowing of the medial joint space with about 3 degrees of varus.  Some subchondral sclerosis on both medial and lateral tibia.  No ectopic calcification or acute changes.  Films are consistent with moderate osteoarthritis    PMFS History: Patient Active Problem List   Diagnosis Date Noted  . Unilateral primary osteoarthritis, right knee 01/06/2020  . S/P hernia repair 10/15/2019  . Neuropathy 07/20/2019  . Macrocytic anemia   . Debility 07/07/2019  . Atelectasis   .  Enterococcal bacteremia 06/26/2019  . Perforated viscus 01/06/2019  . Onychomycosis 05/19/2018  . Foot fracture, left, closed, initial encounter 05/09/2018  . Lisfranc dislocation, left, initial encounter   . Primary pulmonary hypertension ([place]) 07/09/2017  . Diastolic dysfunction [id number]  . OSA (obstructive sleep apnea) 07/08/2017  . SOB (shortness of breath) 07/02/2017  . Chronic respiratory failure with hypoxia and hypercapnia ([place]) 07/02/2017  . Cardiomegaly 07/02/2017  . Exposure keratitis 02/17/2017  . Pedal edema 12/18/2016  . Transaminitis 12/18/2016  . Bilateral pes planus 12/18/2016  . Psoriasis-eczema overlap condition 07/30/2016  . High serum high density lipoprotein (HDL) 09/26/2015  . Epistaxis 09/26/2015  . Renal artery stenosis in 1 of 2 vessels ([place])   .  Essential tremor   . Elevated PSA   . GERD (gastroesophageal reflux disease)   . Health maintenance examination 10/08/2014  . Anxiety state 10/08/2014  . Obesity, Class I, BMI 30-34.9   . Mild intermittent asthma in adult without complication   . Seasonal allergies   . HTN (hypertension)   . Scoliosis   . Osteoporosis    Past Medical History:  Diagnosis Date  . Anemia   . Anxiety    situational  . BPH (benign prostatic hyperplasia)    on flomax- Patien tand wife denies- and patient has never been on Flomax  . Bundle branch block    per prior PCP records  . CHF (congestive heart failure) ([place])   . Chronic kidney disease    Acute Renal Failure- June 2021- admission- kidneys recovered quickly  . Complication of anesthesia 03/11/2019   woke up before surgery was over- "paralytic"  . COPD (chronic obstructive pulmonary disease) ([place])   . Diverticulosis    by colonoscopy  . Eczema    per prior PCP records  . Elevated PSA 2015   peaked 6s, s/p benign biopsy 2014, sees urology [name] ([place])  . Essential tremor   . GERD (gastroesophageal reflux disease)    per prior PCP records  . History of panic attacks    per prior PCP records  . HTN (hypertension)   . Mild intermittent asthma in adult without complication   . Obesity, Class I, BMI 30-34.9   . Osteoporosis    DEXA 06/2013 with osteopenia - h/o ?wrist/hip fracture from AVN from chronic steroid use (asthma), took reclast for 1 year  . Pneumonia    10/08/19 >10 years ago  . Seasonal allergies   . Sleep apnea   . Thoracic scoliosis childhood  . Transaminitis    per prior PCP records  . Vitamin B 12 deficiency     Family History  Problem Relation Age of Onset  . Cancer Father 16       colon  . Prostate cancer Father   . Diabetes Mother   . Alcohol abuse Mother   . Stroke Mother   . Cancer Maternal Grandmother        pancreatic  . Ataxia Brother   . Ataxia Sister   . CAD Neg Hx     Past Surgical History:   Procedure Laterality Date  . APPLICATION OF WOUND VAC Left 05/09/2018   Procedure: Application Of Wound Vac;  Surgeon: Newt Minion, MD;  Location: [facility];  Service: Orthopedics;  Laterality: Left;  . BIOPSY  04/30/2019   Procedure: BIOPSY;  Surgeon: Juanita Craver, MD;  Location: [facility];  Service: Endoscopy;;  . COLONOSCOPY  12/2013   polyps, int hem, diverticulosis, rpt 5 yrs ([place])  . COLONOSCOPY  WITH PROPOFOL N/A 04/30/2019   Procedure: COLONOSCOPY WITH PROPOFOL;  Surgeon: Juanita Craver, MD;  Location: [facility];  Service: Endoscopy;  Laterality: N/A;  . ELBOW SURGERY Right 2008   golfer's elbow  . ESOPHAGOGASTRODUODENOSCOPY (EGD) WITH PROPOFOL N/A 04/30/2019   Procedure: ESOPHAGOGASTRODUODENOSCOPY (EGD) WITH PROPOFOL;  Surgeon: Juanita Craver, MD;  Location: [facility];  Service: Endoscopy;  Laterality: N/A;  . INCISIONAL HERNIA REPAIR N/A 10/15/2019   Procedure: HERNIA REPAIR INCISIONAL WITH MESH, TAR;  Surgeon: Ralene Ok, MD;  Location: [facility];  Service: General;  Laterality: N/A;  . INGUINAL HERNIA REPAIR Bilateral childhood & ~1995  . LAPAROTOMY N/A 01/05/2019   Procedure: EXPLORATORY LAPAROTOMY graham patch repair;  Surgeon: Benjamine Sprague, DO;  Location: [facility];  Service: General;  Laterality: N/A;  . LAPAROTOMY N/A 10/15/2019   Procedure: EXPLORATORY LAPAROTOMY;  Surgeon: Ralene Ok, MD;  Location: [facility];  Service: General;  Laterality: N/A;  . LYSIS OF ADHESION N/A 10/15/2019   Procedure: LYSIS OF ADHESION;  Surgeon: Ralene Ok, MD;  Location: [facility];  Service: General;  Laterality: N/A;  . NASAL SEPTUM SURGERY  2013   deviated septum  . ORIF ANKLE FRACTURE Left 05/09/2018   Procedure: OPEN REDUCTION INTERNAL FIXATION LEFT LISFRANC FRACTURE/DISLOCATION;  Surgeon: Newt Minion, MD;  Location: [facility];  Service: Orthopedics;  Laterality: Left;  . POLYPECTOMY  04/30/2019   Procedure: POLYPECTOMY;  Surgeon: Juanita Craver, MD;  Location: [facility];  Service:  Endoscopy;;  . TEE WITHOUT CARDIOVERSION N/A 06/29/2019   Procedure: TRANSESOPHAGEAL ECHOCARDIOGRAM (TEE);  Surgeon: Acie Fredrickson Wonda Cheng, MD;  Location[facility];  Service: Cardiovascular;  Laterality: N/A;  . US ECHOCARDIOGRAPHY  02/2009   WNL, EF >55% [facility])  . US RENAL/AORTA Right 05/2009   1-59% diameter reduction renal artery, rec rpt 2 yrs   Social History   Occupational History  . Not on file  Tobacco Use  . Smoking status: Never Smoker  . Smokeless tobacco: Never Used  Vaping Use  . Vaping Use: Never used  Substance and Sexual Activity  . Alcohol use: Not Currently  . Drug use: No  . Sexual activity: Yes    Birth control/protection: None

## 2020-01-11 ENCOUNTER — Ambulatory Visit (INDEPENDENT_AMBULATORY_CARE_PROVIDER_SITE_OTHER): Payer: BC Managed Care – PPO | Admitting: Family Medicine

## 2020-01-11 ENCOUNTER — Other Ambulatory Visit: Payer: Self-pay

## 2020-01-11 DIAGNOSIS — Z0000 Encounter for general adult medical examination without abnormal findings: Secondary | ICD-10-CM | POA: Diagnosis not present

## 2020-01-11 LAB — CBC WITH DIFFERENTIAL/PLATELET
Basophils Absolute: 0.1 10*3/uL (ref 0.0–0.2)
Basos: 1 %
EOS (ABSOLUTE): 0.1 10*3/uL (ref 0.0–0.4)
Eos: 3 %
Hematocrit: 40.3 % (ref 37.5–51.0)
Hemoglobin: 13.6 g/dL (ref 13.0–17.7)
Immature Grans (Abs): 0 10*3/uL (ref 0.0–0.1)
Immature Granulocytes: 0 %
Lymphocytes Absolute: 1.4 10*3/uL (ref 0.7–3.1)
Lymphs: 36 %
MCH: 30.6 pg (ref 26.6–33.0)
MCHC: 33.7 g/dL (ref 31.5–35.7)
MCV: 91 fL (ref 79–97)
Monocytes Absolute: 0.7 10*3/uL (ref 0.1–0.9)
Monocytes: 17 %
Neutrophils Absolute: 1.7 10*3/uL (ref 1.4–7.0)
Neutrophils: 43 %
Platelets: 292 10*3/uL (ref 150–450)
RBC: 4.45 x10E6/uL (ref 4.14–5.80)
RDW: 13.3 % (ref 11.6–15.4)
WBC: 3.9 10*3/uL (ref 3.4–10.8)

## 2020-01-11 LAB — CMP14+EGFR
ALT: 13 IU/L (ref 0–44)
AST: 21 IU/L (ref 0–40)
Albumin/Globulin Ratio: 1.5 (ref 1.2–2.2)
Albumin: 4.2 g/dL (ref 3.8–4.8)
Alkaline Phosphatase: 100 IU/L (ref 44–121)
BUN/Creatinine Ratio: 17 (ref 10–24)
BUN: 15 mg/dL (ref 8–27)
Bilirubin Total: 0.5 mg/dL (ref 0.0–1.2)
CO2: 25 mmol/L (ref 20–29)
Calcium: 9.5 mg/dL (ref 8.6–10.2)
Chloride: 96 mmol/L (ref 96–106)
Creatinine, Ser: 0.87 mg/dL (ref 0.76–1.27)
GFR calc Af Amer: 107 mL/min/{1.73_m2} (ref >59)
GFR calc non Af Amer: 92 mL/min/{1.73_m2} (ref >59)
Globulin, Total: 2.8 g/dL (ref 1.5–4.5)
Glucose: 102 mg/dL — ABNORMAL HIGH (ref 65–99)
Potassium: 4.9 mmol/L (ref 3.5–5.2)
Sodium: 134 mmol/L (ref 134–144)
Total Protein: 7 g/dL (ref 6.0–8.5)

## 2020-01-11 LAB — LIPID PANEL
Chol/HDL Ratio: 1.5 ratio (ref 0.0–5.0)
Cholesterol, Total: 180 mg/dL (ref 100–199)
HDL: 123 mg/dL (ref >39)
LDL Chol Calc (NIH): 50 mg/dL (ref 0–99)
Triglycerides: 24 mg/dL (ref 0–149)
VLDL Cholesterol Cal: 7 mg/dL (ref 5–40)

## 2020-01-18 DIAGNOSIS — G4733 Obstructive sleep apnea (adult) (pediatric): Secondary | ICD-10-CM | POA: Diagnosis not present

## 2020-01-18 DIAGNOSIS — F329 Major depressive disorder, single episode, unspecified: Secondary | ICD-10-CM | POA: Diagnosis not present

## 2020-01-18 DIAGNOSIS — F419 Anxiety disorder, unspecified: Secondary | ICD-10-CM | POA: Diagnosis not present

## 2020-01-18 DIAGNOSIS — G4700 Insomnia, unspecified: Secondary | ICD-10-CM | POA: Diagnosis not present

## 2020-01-18 DIAGNOSIS — F1020 Alcohol dependence, uncomplicated: Secondary | ICD-10-CM | POA: Diagnosis not present

## 2020-01-22 ENCOUNTER — Encounter: Payer: 59 | Admitting: Family Medicine

## 2020-01-23 DIAGNOSIS — G4700 Insomnia, unspecified: Secondary | ICD-10-CM | POA: Diagnosis not present

## 2020-01-23 DIAGNOSIS — F419 Anxiety disorder, unspecified: Secondary | ICD-10-CM | POA: Diagnosis not present

## 2020-01-23 DIAGNOSIS — F1020 Alcohol dependence, uncomplicated: Secondary | ICD-10-CM | POA: Diagnosis not present

## 2020-01-23 DIAGNOSIS — F329 Major depressive disorder, single episode, unspecified: Secondary | ICD-10-CM | POA: Diagnosis not present

## 2020-01-23 DIAGNOSIS — G4733 Obstructive sleep apnea (adult) (pediatric): Secondary | ICD-10-CM | POA: Diagnosis not present

## 2020-01-24 DIAGNOSIS — F419 Anxiety disorder, unspecified: Secondary | ICD-10-CM | POA: Diagnosis not present

## 2020-01-24 DIAGNOSIS — G4733 Obstructive sleep apnea (adult) (pediatric): Secondary | ICD-10-CM | POA: Diagnosis not present

## 2020-01-24 DIAGNOSIS — F1020 Alcohol dependence, uncomplicated: Secondary | ICD-10-CM | POA: Diagnosis not present

## 2020-01-24 DIAGNOSIS — G4700 Insomnia, unspecified: Secondary | ICD-10-CM | POA: Diagnosis not present

## 2020-01-24 DIAGNOSIS — F329 Major depressive disorder, single episode, unspecified: Secondary | ICD-10-CM | POA: Diagnosis not present

## 2020-01-25 DIAGNOSIS — F1020 Alcohol dependence, uncomplicated: Secondary | ICD-10-CM | POA: Diagnosis not present

## 2020-01-25 DIAGNOSIS — F329 Major depressive disorder, single episode, unspecified: Secondary | ICD-10-CM | POA: Diagnosis not present

## 2020-01-25 DIAGNOSIS — G4700 Insomnia, unspecified: Secondary | ICD-10-CM | POA: Diagnosis not present

## 2020-01-25 DIAGNOSIS — F419 Anxiety disorder, unspecified: Secondary | ICD-10-CM | POA: Diagnosis not present

## 2020-01-25 DIAGNOSIS — G4733 Obstructive sleep apnea (adult) (pediatric): Secondary | ICD-10-CM | POA: Diagnosis not present

## 2020-01-26 DIAGNOSIS — F329 Major depressive disorder, single episode, unspecified: Secondary | ICD-10-CM | POA: Diagnosis not present

## 2020-01-26 DIAGNOSIS — G4700 Insomnia, unspecified: Secondary | ICD-10-CM | POA: Diagnosis not present

## 2020-01-26 DIAGNOSIS — G4733 Obstructive sleep apnea (adult) (pediatric): Secondary | ICD-10-CM | POA: Diagnosis not present

## 2020-01-26 DIAGNOSIS — F1020 Alcohol dependence, uncomplicated: Secondary | ICD-10-CM | POA: Diagnosis not present

## 2020-01-26 DIAGNOSIS — F419 Anxiety disorder, unspecified: Secondary | ICD-10-CM | POA: Diagnosis not present

## 2020-01-27 ENCOUNTER — Encounter: Payer: 59 | Admitting: Family Medicine

## 2020-01-27 DIAGNOSIS — F1020 Alcohol dependence, uncomplicated: Secondary | ICD-10-CM | POA: Diagnosis not present

## 2020-01-27 DIAGNOSIS — G4733 Obstructive sleep apnea (adult) (pediatric): Secondary | ICD-10-CM | POA: Diagnosis not present

## 2020-01-27 DIAGNOSIS — G4700 Insomnia, unspecified: Secondary | ICD-10-CM | POA: Diagnosis not present

## 2020-01-27 DIAGNOSIS — F419 Anxiety disorder, unspecified: Secondary | ICD-10-CM | POA: Diagnosis not present

## 2020-01-27 DIAGNOSIS — F329 Major depressive disorder, single episode, unspecified: Secondary | ICD-10-CM | POA: Diagnosis not present

## 2020-01-28 DIAGNOSIS — F419 Anxiety disorder, unspecified: Secondary | ICD-10-CM | POA: Diagnosis not present

## 2020-01-28 DIAGNOSIS — F1020 Alcohol dependence, uncomplicated: Secondary | ICD-10-CM | POA: Diagnosis not present

## 2020-01-28 DIAGNOSIS — F329 Major depressive disorder, single episode, unspecified: Secondary | ICD-10-CM | POA: Diagnosis not present

## 2020-01-28 DIAGNOSIS — G4700 Insomnia, unspecified: Secondary | ICD-10-CM | POA: Diagnosis not present

## 2020-01-28 DIAGNOSIS — G4733 Obstructive sleep apnea (adult) (pediatric): Secondary | ICD-10-CM | POA: Diagnosis not present

## 2020-01-29 DIAGNOSIS — G4733 Obstructive sleep apnea (adult) (pediatric): Secondary | ICD-10-CM | POA: Diagnosis not present

## 2020-01-29 DIAGNOSIS — G4700 Insomnia, unspecified: Secondary | ICD-10-CM | POA: Diagnosis not present

## 2020-01-29 DIAGNOSIS — F419 Anxiety disorder, unspecified: Secondary | ICD-10-CM | POA: Diagnosis not present

## 2020-01-29 DIAGNOSIS — F329 Major depressive disorder, single episode, unspecified: Secondary | ICD-10-CM | POA: Diagnosis not present

## 2020-01-29 DIAGNOSIS — F1020 Alcohol dependence, uncomplicated: Secondary | ICD-10-CM | POA: Diagnosis not present

## 2020-01-30 DIAGNOSIS — F1020 Alcohol dependence, uncomplicated: Secondary | ICD-10-CM | POA: Diagnosis not present

## 2020-01-30 DIAGNOSIS — G4733 Obstructive sleep apnea (adult) (pediatric): Secondary | ICD-10-CM | POA: Diagnosis not present

## 2020-01-30 DIAGNOSIS — G4700 Insomnia, unspecified: Secondary | ICD-10-CM | POA: Diagnosis not present

## 2020-01-30 DIAGNOSIS — F419 Anxiety disorder, unspecified: Secondary | ICD-10-CM | POA: Diagnosis not present

## 2020-01-30 DIAGNOSIS — F329 Major depressive disorder, single episode, unspecified: Secondary | ICD-10-CM | POA: Diagnosis not present

## 2020-01-31 DIAGNOSIS — G4733 Obstructive sleep apnea (adult) (pediatric): Secondary | ICD-10-CM | POA: Diagnosis not present

## 2020-01-31 DIAGNOSIS — F329 Major depressive disorder, single episode, unspecified: Secondary | ICD-10-CM | POA: Diagnosis not present

## 2020-01-31 DIAGNOSIS — G4700 Insomnia, unspecified: Secondary | ICD-10-CM | POA: Diagnosis not present

## 2020-01-31 DIAGNOSIS — F419 Anxiety disorder, unspecified: Secondary | ICD-10-CM | POA: Diagnosis not present

## 2020-01-31 DIAGNOSIS — F1020 Alcohol dependence, uncomplicated: Secondary | ICD-10-CM | POA: Diagnosis not present

## 2020-02-01 DIAGNOSIS — F329 Major depressive disorder, single episode, unspecified: Secondary | ICD-10-CM | POA: Diagnosis not present

## 2020-02-01 DIAGNOSIS — G4700 Insomnia, unspecified: Secondary | ICD-10-CM | POA: Diagnosis not present

## 2020-02-01 DIAGNOSIS — F419 Anxiety disorder, unspecified: Secondary | ICD-10-CM | POA: Diagnosis not present

## 2020-02-01 DIAGNOSIS — F1020 Alcohol dependence, uncomplicated: Secondary | ICD-10-CM | POA: Diagnosis not present

## 2020-02-01 DIAGNOSIS — G4733 Obstructive sleep apnea (adult) (pediatric): Secondary | ICD-10-CM | POA: Diagnosis not present

## 2020-02-02 DIAGNOSIS — G4700 Insomnia, unspecified: Secondary | ICD-10-CM | POA: Diagnosis not present

## 2020-02-02 DIAGNOSIS — F329 Major depressive disorder, single episode, unspecified: Secondary | ICD-10-CM | POA: Diagnosis not present

## 2020-02-02 DIAGNOSIS — G4733 Obstructive sleep apnea (adult) (pediatric): Secondary | ICD-10-CM | POA: Diagnosis not present

## 2020-02-02 DIAGNOSIS — F1020 Alcohol dependence, uncomplicated: Secondary | ICD-10-CM | POA: Diagnosis not present

## 2020-02-02 DIAGNOSIS — F419 Anxiety disorder, unspecified: Secondary | ICD-10-CM | POA: Diagnosis not present

## 2020-02-03 DIAGNOSIS — F1020 Alcohol dependence, uncomplicated: Secondary | ICD-10-CM | POA: Diagnosis not present

## 2020-02-03 DIAGNOSIS — F329 Major depressive disorder, single episode, unspecified: Secondary | ICD-10-CM | POA: Diagnosis not present

## 2020-02-03 DIAGNOSIS — F419 Anxiety disorder, unspecified: Secondary | ICD-10-CM | POA: Diagnosis not present

## 2020-02-03 DIAGNOSIS — G4700 Insomnia, unspecified: Secondary | ICD-10-CM | POA: Diagnosis not present

## 2020-02-03 DIAGNOSIS — G4733 Obstructive sleep apnea (adult) (pediatric): Secondary | ICD-10-CM | POA: Diagnosis not present

## 2020-02-04 DIAGNOSIS — F1020 Alcohol dependence, uncomplicated: Secondary | ICD-10-CM | POA: Diagnosis not present

## 2020-02-04 DIAGNOSIS — F329 Major depressive disorder, single episode, unspecified: Secondary | ICD-10-CM | POA: Diagnosis not present

## 2020-02-04 DIAGNOSIS — G4700 Insomnia, unspecified: Secondary | ICD-10-CM | POA: Diagnosis not present

## 2020-02-04 DIAGNOSIS — G4733 Obstructive sleep apnea (adult) (pediatric): Secondary | ICD-10-CM | POA: Diagnosis not present

## 2020-02-04 DIAGNOSIS — F419 Anxiety disorder, unspecified: Secondary | ICD-10-CM | POA: Diagnosis not present

## 2020-02-05 DIAGNOSIS — G4700 Insomnia, unspecified: Secondary | ICD-10-CM | POA: Diagnosis not present

## 2020-02-05 DIAGNOSIS — F1020 Alcohol dependence, uncomplicated: Secondary | ICD-10-CM | POA: Diagnosis not present

## 2020-02-05 DIAGNOSIS — G4733 Obstructive sleep apnea (adult) (pediatric): Secondary | ICD-10-CM | POA: Diagnosis not present

## 2020-02-05 DIAGNOSIS — F329 Major depressive disorder, single episode, unspecified: Secondary | ICD-10-CM | POA: Diagnosis not present

## 2020-02-05 DIAGNOSIS — F419 Anxiety disorder, unspecified: Secondary | ICD-10-CM | POA: Diagnosis not present

## 2020-02-06 DIAGNOSIS — F1020 Alcohol dependence, uncomplicated: Secondary | ICD-10-CM | POA: Diagnosis not present

## 2020-02-06 DIAGNOSIS — G4733 Obstructive sleep apnea (adult) (pediatric): Secondary | ICD-10-CM | POA: Diagnosis not present

## 2020-02-06 DIAGNOSIS — F419 Anxiety disorder, unspecified: Secondary | ICD-10-CM | POA: Diagnosis not present

## 2020-02-06 DIAGNOSIS — G4700 Insomnia, unspecified: Secondary | ICD-10-CM | POA: Diagnosis not present

## 2020-02-06 DIAGNOSIS — F329 Major depressive disorder, single episode, unspecified: Secondary | ICD-10-CM | POA: Diagnosis not present

## 2020-02-07 DIAGNOSIS — F1020 Alcohol dependence, uncomplicated: Secondary | ICD-10-CM | POA: Diagnosis not present

## 2020-02-07 DIAGNOSIS — F419 Anxiety disorder, unspecified: Secondary | ICD-10-CM | POA: Diagnosis not present

## 2020-02-07 DIAGNOSIS — G4733 Obstructive sleep apnea (adult) (pediatric): Secondary | ICD-10-CM | POA: Diagnosis not present

## 2020-02-07 DIAGNOSIS — G4700 Insomnia, unspecified: Secondary | ICD-10-CM | POA: Diagnosis not present

## 2020-02-07 DIAGNOSIS — F329 Major depressive disorder, single episode, unspecified: Secondary | ICD-10-CM | POA: Diagnosis not present

## 2020-02-08 DIAGNOSIS — F329 Major depressive disorder, single episode, unspecified: Secondary | ICD-10-CM | POA: Diagnosis not present

## 2020-02-08 DIAGNOSIS — F1020 Alcohol dependence, uncomplicated: Secondary | ICD-10-CM | POA: Diagnosis not present

## 2020-02-08 DIAGNOSIS — F419 Anxiety disorder, unspecified: Secondary | ICD-10-CM | POA: Diagnosis not present

## 2020-02-08 DIAGNOSIS — G4700 Insomnia, unspecified: Secondary | ICD-10-CM | POA: Diagnosis not present

## 2020-02-08 DIAGNOSIS — G4733 Obstructive sleep apnea (adult) (pediatric): Secondary | ICD-10-CM | POA: Diagnosis not present

## 2020-02-09 DIAGNOSIS — F419 Anxiety disorder, unspecified: Secondary | ICD-10-CM | POA: Diagnosis not present

## 2020-02-09 DIAGNOSIS — F329 Major depressive disorder, single episode, unspecified: Secondary | ICD-10-CM | POA: Diagnosis not present

## 2020-02-09 DIAGNOSIS — G4733 Obstructive sleep apnea (adult) (pediatric): Secondary | ICD-10-CM | POA: Diagnosis not present

## 2020-02-09 DIAGNOSIS — F1020 Alcohol dependence, uncomplicated: Secondary | ICD-10-CM | POA: Diagnosis not present

## 2020-02-09 DIAGNOSIS — G4700 Insomnia, unspecified: Secondary | ICD-10-CM | POA: Diagnosis not present

## 2020-02-10 DIAGNOSIS — G4700 Insomnia, unspecified: Secondary | ICD-10-CM | POA: Diagnosis not present

## 2020-02-10 DIAGNOSIS — F329 Major depressive disorder, single episode, unspecified: Secondary | ICD-10-CM | POA: Diagnosis not present

## 2020-02-10 DIAGNOSIS — F419 Anxiety disorder, unspecified: Secondary | ICD-10-CM | POA: Diagnosis not present

## 2020-02-10 DIAGNOSIS — F1020 Alcohol dependence, uncomplicated: Secondary | ICD-10-CM | POA: Diagnosis not present

## 2020-02-10 DIAGNOSIS — G4733 Obstructive sleep apnea (adult) (pediatric): Secondary | ICD-10-CM | POA: Diagnosis not present

## 2020-02-11 DIAGNOSIS — G4733 Obstructive sleep apnea (adult) (pediatric): Secondary | ICD-10-CM | POA: Diagnosis not present

## 2020-02-11 DIAGNOSIS — G4700 Insomnia, unspecified: Secondary | ICD-10-CM | POA: Diagnosis not present

## 2020-02-11 DIAGNOSIS — F1020 Alcohol dependence, uncomplicated: Secondary | ICD-10-CM | POA: Diagnosis not present

## 2020-02-11 DIAGNOSIS — F419 Anxiety disorder, unspecified: Secondary | ICD-10-CM | POA: Diagnosis not present

## 2020-02-11 DIAGNOSIS — F329 Major depressive disorder, single episode, unspecified: Secondary | ICD-10-CM | POA: Diagnosis not present

## 2020-02-12 DIAGNOSIS — F329 Major depressive disorder, single episode, unspecified: Secondary | ICD-10-CM | POA: Diagnosis not present

## 2020-02-12 DIAGNOSIS — G4733 Obstructive sleep apnea (adult) (pediatric): Secondary | ICD-10-CM | POA: Diagnosis not present

## 2020-02-12 DIAGNOSIS — F1020 Alcohol dependence, uncomplicated: Secondary | ICD-10-CM | POA: Diagnosis not present

## 2020-02-12 DIAGNOSIS — F419 Anxiety disorder, unspecified: Secondary | ICD-10-CM | POA: Diagnosis not present

## 2020-02-12 DIAGNOSIS — G4700 Insomnia, unspecified: Secondary | ICD-10-CM | POA: Diagnosis not present

## 2020-02-13 DIAGNOSIS — F329 Major depressive disorder, single episode, unspecified: Secondary | ICD-10-CM | POA: Diagnosis not present

## 2020-02-13 DIAGNOSIS — G4700 Insomnia, unspecified: Secondary | ICD-10-CM | POA: Diagnosis not present

## 2020-02-13 DIAGNOSIS — F1020 Alcohol dependence, uncomplicated: Secondary | ICD-10-CM | POA: Diagnosis not present

## 2020-02-13 DIAGNOSIS — G4733 Obstructive sleep apnea (adult) (pediatric): Secondary | ICD-10-CM | POA: Diagnosis not present

## 2020-02-13 DIAGNOSIS — F419 Anxiety disorder, unspecified: Secondary | ICD-10-CM | POA: Diagnosis not present

## 2020-02-15 ENCOUNTER — Other Ambulatory Visit: Payer: Self-pay | Admitting: Physician Assistant

## 2020-02-18 DIAGNOSIS — F1020 Alcohol dependence, uncomplicated: Secondary | ICD-10-CM | POA: Diagnosis not present

## 2020-02-18 DIAGNOSIS — G4733 Obstructive sleep apnea (adult) (pediatric): Secondary | ICD-10-CM | POA: Diagnosis not present

## 2020-02-18 DIAGNOSIS — K219 Gastro-esophageal reflux disease without esophagitis: Secondary | ICD-10-CM | POA: Diagnosis not present

## 2020-02-18 DIAGNOSIS — I10 Essential (primary) hypertension: Secondary | ICD-10-CM | POA: Diagnosis not present

## 2020-02-18 DIAGNOSIS — J45909 Unspecified asthma, uncomplicated: Secondary | ICD-10-CM | POA: Diagnosis not present

## 2020-02-18 DIAGNOSIS — F329 Major depressive disorder, single episode, unspecified: Secondary | ICD-10-CM | POA: Diagnosis not present

## 2020-02-18 DIAGNOSIS — F419 Anxiety disorder, unspecified: Secondary | ICD-10-CM | POA: Diagnosis not present

## 2020-02-18 DIAGNOSIS — G4700 Insomnia, unspecified: Secondary | ICD-10-CM | POA: Diagnosis not present

## 2020-02-18 DIAGNOSIS — R251 Tremor, unspecified: Secondary | ICD-10-CM | POA: Diagnosis not present

## 2020-02-18 DIAGNOSIS — G629 Polyneuropathy, unspecified: Secondary | ICD-10-CM | POA: Diagnosis not present

## 2020-02-18 DIAGNOSIS — M419 Scoliosis, unspecified: Secondary | ICD-10-CM | POA: Diagnosis not present

## 2020-02-18 DIAGNOSIS — M1711 Unilateral primary osteoarthritis, right knee: Secondary | ICD-10-CM | POA: Diagnosis not present

## 2020-02-22 DIAGNOSIS — G4700 Insomnia, unspecified: Secondary | ICD-10-CM | POA: Diagnosis not present

## 2020-02-22 DIAGNOSIS — I10 Essential (primary) hypertension: Secondary | ICD-10-CM | POA: Diagnosis not present

## 2020-02-22 DIAGNOSIS — R251 Tremor, unspecified: Secondary | ICD-10-CM | POA: Diagnosis not present

## 2020-02-22 DIAGNOSIS — F419 Anxiety disorder, unspecified: Secondary | ICD-10-CM | POA: Diagnosis not present

## 2020-02-22 DIAGNOSIS — F329 Major depressive disorder, single episode, unspecified: Secondary | ICD-10-CM | POA: Diagnosis not present

## 2020-02-22 DIAGNOSIS — J45909 Unspecified asthma, uncomplicated: Secondary | ICD-10-CM | POA: Diagnosis not present

## 2020-02-22 DIAGNOSIS — G629 Polyneuropathy, unspecified: Secondary | ICD-10-CM | POA: Diagnosis not present

## 2020-02-22 DIAGNOSIS — K219 Gastro-esophageal reflux disease without esophagitis: Secondary | ICD-10-CM | POA: Diagnosis not present

## 2020-02-22 DIAGNOSIS — G4733 Obstructive sleep apnea (adult) (pediatric): Secondary | ICD-10-CM | POA: Diagnosis not present

## 2020-02-22 DIAGNOSIS — M1711 Unilateral primary osteoarthritis, right knee: Secondary | ICD-10-CM | POA: Diagnosis not present

## 2020-02-22 DIAGNOSIS — F1020 Alcohol dependence, uncomplicated: Secondary | ICD-10-CM | POA: Diagnosis not present

## 2020-02-22 DIAGNOSIS — M419 Scoliosis, unspecified: Secondary | ICD-10-CM | POA: Diagnosis not present

## 2020-02-23 DIAGNOSIS — G4733 Obstructive sleep apnea (adult) (pediatric): Secondary | ICD-10-CM | POA: Diagnosis not present

## 2020-02-23 DIAGNOSIS — F1020 Alcohol dependence, uncomplicated: Secondary | ICD-10-CM | POA: Diagnosis not present

## 2020-02-23 DIAGNOSIS — M419 Scoliosis, unspecified: Secondary | ICD-10-CM | POA: Diagnosis not present

## 2020-02-23 DIAGNOSIS — R251 Tremor, unspecified: Secondary | ICD-10-CM | POA: Diagnosis not present

## 2020-02-23 DIAGNOSIS — J45909 Unspecified asthma, uncomplicated: Secondary | ICD-10-CM | POA: Diagnosis not present

## 2020-02-23 DIAGNOSIS — M1711 Unilateral primary osteoarthritis, right knee: Secondary | ICD-10-CM | POA: Diagnosis not present

## 2020-02-23 DIAGNOSIS — K219 Gastro-esophageal reflux disease without esophagitis: Secondary | ICD-10-CM | POA: Diagnosis not present

## 2020-02-23 DIAGNOSIS — F329 Major depressive disorder, single episode, unspecified: Secondary | ICD-10-CM | POA: Diagnosis not present

## 2020-02-23 DIAGNOSIS — G4700 Insomnia, unspecified: Secondary | ICD-10-CM | POA: Diagnosis not present

## 2020-02-23 DIAGNOSIS — F419 Anxiety disorder, unspecified: Secondary | ICD-10-CM | POA: Diagnosis not present

## 2020-02-23 DIAGNOSIS — G629 Polyneuropathy, unspecified: Secondary | ICD-10-CM | POA: Diagnosis not present

## 2020-02-23 DIAGNOSIS — I10 Essential (primary) hypertension: Secondary | ICD-10-CM | POA: Diagnosis not present

## 2020-02-24 ENCOUNTER — Other Ambulatory Visit: Payer: Self-pay

## 2020-02-24 ENCOUNTER — Ambulatory Visit (INDEPENDENT_AMBULATORY_CARE_PROVIDER_SITE_OTHER): Payer: BC Managed Care – PPO | Admitting: Family Medicine

## 2020-02-24 ENCOUNTER — Encounter: Payer: Self-pay | Admitting: Family Medicine

## 2020-02-24 VITALS — BP 124/76 | HR 66 | Temp 98.1°F | Ht 71.0 in | Wt 200.0 lb

## 2020-02-24 DIAGNOSIS — G629 Polyneuropathy, unspecified: Secondary | ICD-10-CM

## 2020-02-24 DIAGNOSIS — F1011 Alcohol abuse, in remission: Secondary | ICD-10-CM

## 2020-02-24 DIAGNOSIS — G4733 Obstructive sleep apnea (adult) (pediatric): Secondary | ICD-10-CM

## 2020-02-24 DIAGNOSIS — I10 Essential (primary) hypertension: Secondary | ICD-10-CM

## 2020-02-24 DIAGNOSIS — G4700 Insomnia, unspecified: Secondary | ICD-10-CM | POA: Diagnosis not present

## 2020-02-24 MED ORDER — AMLODIPINE BESYLATE 10 MG PO TABS: 10.0000 mg | ORAL_TABLET | Freq: Every day | ORAL | 2 refills | Status: DC

## 2020-02-24 MED ORDER — NALTREXONE HCL 50 MG PO TABS: 50.0000 mg | ORAL_TABLET | Freq: Every day | ORAL | 2 refills | Status: DC

## 2020-02-24 MED ORDER — TRAZODONE HCL 50 MG PO TABS: 50.0000 mg | ORAL_TABLET | Freq: Every day | ORAL | 1 refills | Status: DC

## 2020-02-24 NOTE — Patient Instructions (Addendum)
Trazodone if needed for sleep, let me know if more consistent need.  No med changes at this time.   Recheck in 5 months, but certainly let me know if there are questions sooner.    If you have lab work done today you will be contacted with your lab results within the next 2 weeks.  If you have not heard from [place] then please contact us. The fastest way to get your results is to register for My Chart.   IF you received an x-ray today, you will receive an invoice from [facility]. Please contact [facility] at [phone] with questions or concerns regarding your invoice.   IF you received labwork today, you will receive an invoice from [facility]. Please contact [facility] at [phone] with questions or concerns regarding your invoice.   Our billing staff will not be able to assist you with questions regarding bills from these companies.  You will be contacted with the lab results as soon as they are available. The fastest way to get your results is to activate your My Chart account. Instructions are located on the last page of this paperwork. If you have not heard from [place] regarding the results in 2 weeks, please contact this office.

## 2020-02-24 NOTE — Progress Notes (Signed)
Subjective:  [name], male    DOB: 06-Jun-1955  Age: 65 y.o. MRN: [id number]  CC:  Chief Complaint  Patient presents with  . Transitions Of Care    Pt reports he feels great. Pt wants the provider to be aware that he is current in recovery from alcohol abuse. Pt tested positive for COVID-19 on 02/11/2020. Pt tested negative on 02/21/2020. Pt isolated for 5 days and wore mask for 5 days. Pt was A-symptomatic.    HPI Louis [name] presents for   Transition of care.   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   Alcohol addiction, in recovery.   Prior use of wine.  Went through [facility], discharged 02/14/20 after 28 day program.  Now in outpatient intensive therapy. 8 week program.  Sober since 01/19/20.  Feels great.  Naltrexone 50mg  qd. Plan to remain on this for a year.   [place] Office Visit from 02/24/2020 in Primary Care at [facility]  AUDIT-C Score 0      Neuro: Lower extremity neuropathy, Dr. Jannifer Franklin is neurologist. Also had inpatient then outpatient rehab after bacteremia in July 2021. Completed.  Still takes gabapentin 300mg  BID.   Hypertension: norvasc 10mg  qd, losartan 100mg  qd, toprol XL 50mg , and had hctz added at [facility].  Hx of diastolic CHF, Pulmonary HTN by problem list. Renal aa. Stenosis noted on problem list.  Echo 06/2019 (TEE): Left ventricular ejection fraction, by estimation, is 60 to 65%. The left ventricle has normal function. The left ventricle has no regional wall motion abnormalities. 2. Right ventricular systolic function is normal. The right ventricular size is normal. 3. No left atrial/left atrial appendage thrombus was detected. 4. The mitral valve is grossly normal. Mild to moderate mitral valve regurgitation. No evidence of mitral stenosis. 5. The aortic valve is normal in structure. Aortic valve regurgitation is trivial. No aortic stenosis Cardiology - Dr. Claiborne Billings.  Home readings: 120/70's  BP Readings  from Last 3 Encounters:  02/24/20 124/76  12/18/19 122/70  11/04/19 120/60   Lab Results  Component Value Date   CREATININE 0.87 01/11/2020    Used trazodone 50mg  qhs in [facility] for sleep, but not needing recently. Out of meds.   Followed by Dr. Durward Fortes for R knee osteoarthritis.   Peptic Ulcer disease With GERD, GI - Dr. [name].  Duodenal ulcer in January 2021.  Takes omeprazole once per day.  Pulmonary: Hx of scoliosis, chronic respiratory failure. Asthma. Prior pulmonary rehab.  On CPAP for OSA, O2 2L at night - restrictive component.  Followed by [name].   Urology: Hx of elevated PSA - followed by Dr. Diona Fanti. PSA 4.95 in October.  Yearly follow up.   Recent labs from [facility]: January 18.  Total cholesterol 210, HDL 149, trig 42, LDL 49.  BMP within normal limits except sodium 129, chloride 89 bili 2.1, indirect 1.7, direct 0.4.  AST 76, ALT 61.  B12 1315, vitamin D 61, mag 1.7, CBC within normal limits, folate 17.9, TSH 1.25, A1c 4.6, amylase 68 HIV, RPR nonreactive hepatitis panel acute nonreactive nonreactive Hb core antibody  Repeat labs January 20 with normal sodium 135, chloride 97 LFTs normalized with AST 14, 15 ALT 15, on January 28  Hx of Osteoporosis Treated with Reclast injection for 2 years in past.  Normal bone density 10/12/19.   Incisional hernia repair in 10/21.   History Patient Active Problem List   Diagnosis Date Noted  . Unilateral primary osteoarthritis,  right knee 01/06/2020  . S/P hernia repair 10/15/2019  . Neuropathy 07/20/2019  . Macrocytic anemia   . Debility 07/07/2019  . Atelectasis   . Enterococcal bacteremia 06/26/2019  . Perforated viscus 01/06/2019  . Onychomycosis 05/19/2018  . Foot fracture, left, closed, initial encounter 05/09/2018  . Lisfranc dislocation, left, initial encounter   . Primary pulmonary hypertension ([place]) 07/09/2017  . Diastolic dysfunction 84/13/2440  . OSA (obstructive sleep apnea)  07/08/2017  . SOB (shortness of breath) 07/02/2017  . Chronic respiratory failure with hypoxia and hypercapnia ([place]) 07/02/2017  . Cardiomegaly 07/02/2017  . Exposure keratitis 02/17/2017  . Pedal edema 12/18/2016  . Transaminitis 12/18/2016  . Bilateral pes planus 12/18/2016  . Psoriasis-eczema overlap condition 07/30/2016  . High serum high density lipoprotein (HDL) 09/26/2015  . Epistaxis 09/26/2015  . Renal artery stenosis in 1 of 2 vessels ([place])   . Essential tremor   . Elevated PSA   . GERD (gastroesophageal reflux disease)   . Health maintenance examination 10/08/2014  . Anxiety state 10/08/2014  . Obesity, Class I, BMI 30-34.9   . Mild intermittent asthma in adult without complication   . Seasonal allergies   . HTN (hypertension)   . Scoliosis   . Osteoporosis    Past Medical History:  Diagnosis Date  . Alcohol use disorder, mild, abuse   . Anemia   . Anxiety    situational  . Arthritis    Phreesia 02/21/2020  . Asthma    Phreesia 02/21/2020  . BPH (benign prostatic hyperplasia)    on flomax- Patien tand wife denies- and patient has never been on Flomax  . Bundle branch block    per prior PCP records  . CHF (congestive heart failure) ([place])   . Chronic kidney disease    Acute Renal Failure- June 2021- admission- kidneys recovered quickly  . Complication of anesthesia 03/11/2019   woke up before surgery was over- "paralytic"  . COPD (chronic obstructive pulmonary disease) ([place])   . Diverticulosis    by colonoscopy  . Eczema    per prior PCP records  . Elevated PSA 2015   peaked 6s, s/p benign biopsy 2014, sees urology [name] ([place])  . Essential tremor   . GERD (gastroesophageal reflux disease)    per prior PCP records  . History of panic attacks    per prior PCP records  . HTN (hypertension)   . Hypertension    Phreesia 02/21/2020  . Mild intermittent asthma in adult without complication   . Obesity, Class I, BMI 30-34.9   . Osteoporosis     DEXA 06/2013 with osteopenia - h/o ?wrist/hip fracture from AVN from chronic steroid use (asthma), took reclast for 1 year  . Pneumonia    10/08/19 >10 years ago  . Seasonal allergies   . Sleep apnea   . Substance abuse ([place])    Phreesia 02/21/2020  . Thoracic scoliosis childhood  . Transaminitis    per prior PCP records  . Vitamin B 12 deficiency    Past Surgical History:  Procedure Laterality Date  . APPLICATION OF WOUND VAC Left 05/09/2018   Procedure: Application Of Wound Vac;  Surgeon: Newt Minion, MD;  Location: [place];  Service: Orthopedics;  Laterality: Left;  . BIOPSY  04/30/2019   Procedure: BIOPSY;  Surgeon: Juanita Craver, MD;  Location: [facility];  Service: Endoscopy;;  . COLONOSCOPY  12/2013   polyps, int hem, diverticulosis, rpt 5 yrs ([place])  . COLONOSCOPY WITH PROPOFOL N/A 04/30/2019  Procedure: COLONOSCOPY WITH PROPOFOL;  Surgeon: Juanita Craver, MD;  Location: [facility];  Service: Endoscopy;  Laterality: N/A;  . ELBOW SURGERY Right 2008   golfer's elbow  . ESOPHAGOGASTRODUODENOSCOPY (EGD) WITH PROPOFOL N/A 04/30/2019   Procedure: ESOPHAGOGASTRODUODENOSCOPY (EGD) WITH PROPOFOL;  Surgeon: Juanita Craver, MD;  Location: [facility];  Service: Endoscopy;  Laterality: N/A;  . HERNIA REPAIR N/A    Phreesia 02/21/2020  . INCISIONAL HERNIA REPAIR N/A 10/15/2019   Procedure: HERNIA REPAIR INCISIONAL WITH MESH, TAR;  Surgeon: Ralene Ok, MD;  Location: [place];  Service: General;  Laterality: N/A;  . INGUINAL HERNIA REPAIR Bilateral childhood & ~1995  . LAPAROTOMY N/A 01/05/2019   Procedure: EXPLORATORY LAPAROTOMY graham patch repair;  Surgeon: Benjamine Sprague, DO;  Location: [facility];  Service: General;  Laterality: N/A;  . LAPAROTOMY N/A 10/15/2019   Procedure: EXPLORATORY LAPAROTOMY;  Surgeon: Ralene Ok, MD;  Location: [place];  Service: General;  Laterality: N/A;  . LYSIS OF ADHESION N/A 10/15/2019   Procedure: LYSIS OF ADHESION;  Surgeon: Ralene Ok, MD;   Location: [place];  Service: General;  Laterality: N/A;  . NASAL SEPTUM SURGERY  2013   deviated septum  . ORIF ANKLE FRACTURE Left 05/09/2018   Procedure: OPEN REDUCTION INTERNAL FIXATION LEFT LISFRANC FRACTURE/DISLOCATION;  Surgeon: Newt Minion, MD;  Location: [place];  Service: Orthopedics;  Laterality: Left;  . POLYPECTOMY  04/30/2019   Procedure: POLYPECTOMY;  Surgeon: Juanita Craver, MD;  [facility];  Service: Endoscopy;;  . TEE WITHOUT CARDIOVERSION N/A 06/29/2019   Procedure: TRANSESOPHAGEAL ECHOCARDIOGRAM (TEE);  Surgeon: Acie Fredrickson Wonda Cheng, MD;  Location: [facility];  Service: Cardiovascular;  Laterality: N/A;  . US ECHOCARDIOGRAPHY  02/2009   WNL, EF >55% Claiborne Billings)  . US RENAL/AORTA Right 05/2009   1-59% diameter reduction renal artery, rec rpt 2 yrs  . VASECTOMY N/A    Phreesia 02/21/2020   Allergies  Allergen Reactions  . Accupril [Quinapril Hcl] Cough  . Nsaids     Perforated gastric ulcer  . Other    Prior to Admission medications   Medication Sig Start Date End Date Taking? Authorizing Provider  acetaminophen (TYLENOL) 500 MG tablet Take 500 mg by mouth every 6 (six) hours as needed for mild pain.   Yes [provider]  amLODipine (NORVASC) 5 MG tablet TAKE 1 TABLET(5 MG) BY MOUTH DAILY Patient taking differently: Take 10 mg by mouth daily. 12/22/18  Yes Troy Sine, MD  diclofenac Sodium (VOLTAREN) 1 % GEL Apply topically 4 (four) times daily.   Yes [provider]  folic acid (FOLVITE) 1 MG tablet TAKE 1 TABLET BY MOUTH EVERY DAY 11/25/19  Yes Wendie Agreste, MD  gabapentin (NEURONTIN) 300 MG capsule Take 1 capsule (300 mg total) by mouth 2 (two) times daily. 10/26/19  Yes Kathrynn Ducking, MD  losartan (COZAAR) 100 MG tablet TAKE 1 TABLET(100 MG) BY MOUTH DAILY 02/15/20  Yes Troy Sine, MD  metoprolol succinate (TOPROL-XL) 50 MG 24 hr tablet Take 1 tablet (50 mg total) by mouth daily. Take with a meal. 07/17/19  Yes Love, Ivan Anchors, PA-C   Multiple Vitamin (MULTIVITAMIN WITH MINERALS) TABS tablet Take 1 tablet by mouth daily. 07/08/19  Yes Dahal, Marlowe Aschoff, MD  naltrexone (DEPADE) 50 MG tablet Take 50 mg by mouth daily. 02/17/20  Yes [provider]  omeprazole (PRILOSEC) 20 MG capsule Take 20 mg by mouth daily.   Yes [provider]  traZODone (DESYREL) 50 MG tablet Take 50  mg by mouth at bedtime. 02/17/20  Yes [provider]  ALPRAZolam Duanne Moron) 0.5 MG tablet Take 1 tablet (0.5 mg total) by mouth daily as needed (essential tremor). Patient not taking: Reported on 02/24/2020 07/17/19   Love, Ivan Anchors, PA-C  cyanocobalamin 1000 MCG tablet Take 1 tablet (1,000 mcg total) by mouth daily. Available over the counter Patient not taking: Reported on 02/24/2020 07/17/19   Love, Ivan Anchors, PA-C  thiamine 100 MG tablet Take 1 tablet (100 mg total) by mouth daily. Available over the counter Patient not taking: Reported on 02/24/2020 07/17/19   Love, Ivan Anchors, PA-C   Social History   Socioeconomic History  . Marital status: Married    Spouse name: Not on file  . Number of children: Not on file  . Years of education: Not on file  . Highest education level: Not on file  Occupational History  . Not on file  Tobacco Use  . Smoking status: Never Smoker  . Smokeless tobacco: Never Used  Vaping Use  . Vaping Use: Never used  Substance and Sexual Activity  . Alcohol use: Not Currently  . Drug use: No  . Sexual activity: Yes    Birth control/protection: None  Other Topics Concern  . 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  Eyes: Negative for visual disturbance.  Respiratory: Negative for cough, chest tightness and shortness of breath.   Cardiovascular: Negative for chest pain, palpitations and leg swelling.  Gastrointestinal: Negative for abdominal pain and blood in stool.  Neurological: Negative for dizziness, light-headedness and headaches.     Objective:   Vitals:   02/24/20 1126  BP: 124/76  Pulse: 66  Temp: 98.1 F (36.7 C)  TempSrc: Temporal  SpO2: 97%  Weight: 200 lb (90.7 kg)  Height: 5\' 11"  (1.803 m)     Physical Exam Vitals reviewed.  Constitutional:      Appearance: He is well-developed and well-nourished.  HENT:     Head: Normocephalic and atraumatic.  Eyes:     Extraocular Movements: EOM normal.     Pupils: Pupils are equal, round, and reactive to light.  Neck:     Vascular: No carotid bruit or JVD.  Cardiovascular:     Rate and Rhythm: Normal rate and regular rhythm.     Heart sounds: Normal heart sounds. No murmur heard.   Pulmonary:     Effort: Pulmonary effort is normal.     Breath sounds: Normal breath sounds. No rales.  Musculoskeletal:        General: No edema.  Skin:    General: Skin is warm and dry.  Neurological:     Mental Status: He is alert and oriented to person, place, and time.  Psychiatric:        Mood and Affect: Mood and affect normal.    Healed midline scar  from previous incisional hernia repair   53 minutes spent during visit, greater than 50% counseling and assimilation of information, chart review, and discussion of plan.   Assessment & Plan:  Louis [name] is a 65 y.o. male . Essential hypertension - Plan: amLODipine (NORVASC) 10 MG tablet  Stable, tolerating current regimen with tiredness amlodipine as well as addition of  hydrochlorothiazide.  Continue losartan, Toprol same doses.  Recheck 5 months.  Recent labs reviewed from [facility] all including normalization of LFTs and sodium.  Alcohol abuse, in remission - Plan: naltrexone (DEPADE) 50 MG tablet  -Commended on sobriety, doing well, denies current resources.  Continuing with outpatient therapy, naltrexone refilled.   Insomnia, unspecified type - Plan: traZODone (DESYREL) 50 MG tablet  -Improved, trazodone prescribed if needed, RTC precautions.  Neuropathy  -Stable with gabapentin, followed by neurology, no changes.  OSA (obstructive sleep apnea)  -Stable, continue routine follow-up with pulmonary  GERD with history of peptic ulcer disease Continue PPI.  Asymptomatic at present  Meds ordered this encounter  Medications  . amLODipine (NORVASC) 10 MG tablet    Sig: Take 1 tablet (10 mg total) by mouth daily.    Dispense:  90 tablet    Refill:  2  . naltrexone (DEPADE) 50 MG tablet    Sig: Take 1 tablet (50 mg total) by mouth daily.    Dispense:  90 tablet    Refill:  2  . traZODone (DESYREL) 50 MG tablet    Sig: Take 1 tablet (50 mg total) by mouth at bedtime.    Dispense:  30 tablet    Refill:  1   Patient Instructions   Trazodone if needed for sleep, let me know if more consistent need.  No med changes at this time.   Recheck in 5 months, but certainly let me know if there are questions sooner.    If you have lab work done today you will be contacted with your lab results within the next 2 weeks.  If you have not heard from [place] then please contact us. The fastest way to get your results is to register for My Chart.   IF you received an x-ray today, you will receive an invoice from [facility]. Please contact [facility] at [phone] with questions or concerns regarding your invoice.   IF you received labwork today, you will receive an invoice from [facility]. Please contact [facility] at [phone] with questions or  concerns regarding your invoice.   Our billing staff will not be able to assist you with questions regarding bills from these companies.  You will be contacted with the lab results as soon as they are available. The fastest way to get your results is to activate your My Chart account. Instructions are located on the last page of this paperwork. If you have not heard from [place] regarding the results in 2 weeks, please contact this office.         Signed, Merri Ray, MD [facility]

## 2020-02-25 DIAGNOSIS — M1711 Unilateral primary osteoarthritis, right knee: Secondary | ICD-10-CM | POA: Diagnosis not present

## 2020-02-25 DIAGNOSIS — J45909 Unspecified asthma, uncomplicated: Secondary | ICD-10-CM | POA: Diagnosis not present

## 2020-02-25 DIAGNOSIS — K219 Gastro-esophageal reflux disease without esophagitis: Secondary | ICD-10-CM | POA: Diagnosis not present

## 2020-02-25 DIAGNOSIS — G4700 Insomnia, unspecified: Secondary | ICD-10-CM | POA: Diagnosis not present

## 2020-02-25 DIAGNOSIS — F1020 Alcohol dependence, uncomplicated: Secondary | ICD-10-CM | POA: Diagnosis not present

## 2020-02-25 DIAGNOSIS — G629 Polyneuropathy, unspecified: Secondary | ICD-10-CM | POA: Diagnosis not present

## 2020-02-25 DIAGNOSIS — G4733 Obstructive sleep apnea (adult) (pediatric): Secondary | ICD-10-CM | POA: Diagnosis not present

## 2020-02-25 DIAGNOSIS — I10 Essential (primary) hypertension: Secondary | ICD-10-CM | POA: Diagnosis not present

## 2020-02-25 DIAGNOSIS — F329 Major depressive disorder, single episode, unspecified: Secondary | ICD-10-CM | POA: Diagnosis not present

## 2020-02-25 DIAGNOSIS — M419 Scoliosis, unspecified: Secondary | ICD-10-CM | POA: Diagnosis not present

## 2020-02-25 DIAGNOSIS — F419 Anxiety disorder, unspecified: Secondary | ICD-10-CM | POA: Diagnosis not present

## 2020-02-25 DIAGNOSIS — R251 Tremor, unspecified: Secondary | ICD-10-CM | POA: Diagnosis not present

## 2020-02-29 DIAGNOSIS — G629 Polyneuropathy, unspecified: Secondary | ICD-10-CM | POA: Diagnosis not present

## 2020-02-29 DIAGNOSIS — G4733 Obstructive sleep apnea (adult) (pediatric): Secondary | ICD-10-CM | POA: Diagnosis not present

## 2020-02-29 DIAGNOSIS — F1020 Alcohol dependence, uncomplicated: Secondary | ICD-10-CM | POA: Diagnosis not present

## 2020-02-29 DIAGNOSIS — M1711 Unilateral primary osteoarthritis, right knee: Secondary | ICD-10-CM | POA: Diagnosis not present

## 2020-02-29 DIAGNOSIS — F419 Anxiety disorder, unspecified: Secondary | ICD-10-CM | POA: Diagnosis not present

## 2020-02-29 DIAGNOSIS — J45909 Unspecified asthma, uncomplicated: Secondary | ICD-10-CM | POA: Diagnosis not present

## 2020-02-29 DIAGNOSIS — R251 Tremor, unspecified: Secondary | ICD-10-CM | POA: Diagnosis not present

## 2020-02-29 DIAGNOSIS — I10 Essential (primary) hypertension: Secondary | ICD-10-CM | POA: Diagnosis not present

## 2020-02-29 DIAGNOSIS — K219 Gastro-esophageal reflux disease without esophagitis: Secondary | ICD-10-CM | POA: Diagnosis not present

## 2020-02-29 DIAGNOSIS — G4700 Insomnia, unspecified: Secondary | ICD-10-CM | POA: Diagnosis not present

## 2020-02-29 DIAGNOSIS — F329 Major depressive disorder, single episode, unspecified: Secondary | ICD-10-CM | POA: Diagnosis not present

## 2020-02-29 DIAGNOSIS — M419 Scoliosis, unspecified: Secondary | ICD-10-CM | POA: Diagnosis not present

## 2020-03-01 DIAGNOSIS — K219 Gastro-esophageal reflux disease without esophagitis: Secondary | ICD-10-CM | POA: Diagnosis not present

## 2020-03-01 DIAGNOSIS — F419 Anxiety disorder, unspecified: Secondary | ICD-10-CM | POA: Diagnosis not present

## 2020-03-01 DIAGNOSIS — F1020 Alcohol dependence, uncomplicated: Secondary | ICD-10-CM | POA: Diagnosis not present

## 2020-03-01 DIAGNOSIS — F329 Major depressive disorder, single episode, unspecified: Secondary | ICD-10-CM | POA: Diagnosis not present

## 2020-03-01 DIAGNOSIS — M1711 Unilateral primary osteoarthritis, right knee: Secondary | ICD-10-CM | POA: Diagnosis not present

## 2020-03-01 DIAGNOSIS — R251 Tremor, unspecified: Secondary | ICD-10-CM | POA: Diagnosis not present

## 2020-03-01 DIAGNOSIS — G4700 Insomnia, unspecified: Secondary | ICD-10-CM | POA: Diagnosis not present

## 2020-03-01 DIAGNOSIS — I10 Essential (primary) hypertension: Secondary | ICD-10-CM | POA: Diagnosis not present

## 2020-03-01 DIAGNOSIS — G629 Polyneuropathy, unspecified: Secondary | ICD-10-CM | POA: Diagnosis not present

## 2020-03-01 DIAGNOSIS — G4733 Obstructive sleep apnea (adult) (pediatric): Secondary | ICD-10-CM | POA: Diagnosis not present

## 2020-03-01 DIAGNOSIS — M419 Scoliosis, unspecified: Secondary | ICD-10-CM | POA: Diagnosis not present

## 2020-03-01 DIAGNOSIS — J45909 Unspecified asthma, uncomplicated: Secondary | ICD-10-CM | POA: Diagnosis not present

## 2020-03-03 DIAGNOSIS — R251 Tremor, unspecified: Secondary | ICD-10-CM | POA: Diagnosis not present

## 2020-03-03 DIAGNOSIS — K219 Gastro-esophageal reflux disease without esophagitis: Secondary | ICD-10-CM | POA: Diagnosis not present

## 2020-03-03 DIAGNOSIS — G629 Polyneuropathy, unspecified: Secondary | ICD-10-CM | POA: Diagnosis not present

## 2020-03-03 DIAGNOSIS — M1711 Unilateral primary osteoarthritis, right knee: Secondary | ICD-10-CM | POA: Diagnosis not present

## 2020-03-03 DIAGNOSIS — I10 Essential (primary) hypertension: Secondary | ICD-10-CM | POA: Diagnosis not present

## 2020-03-03 DIAGNOSIS — F329 Major depressive disorder, single episode, unspecified: Secondary | ICD-10-CM | POA: Diagnosis not present

## 2020-03-03 DIAGNOSIS — G4700 Insomnia, unspecified: Secondary | ICD-10-CM | POA: Diagnosis not present

## 2020-03-03 DIAGNOSIS — F419 Anxiety disorder, unspecified: Secondary | ICD-10-CM | POA: Diagnosis not present

## 2020-03-03 DIAGNOSIS — F1020 Alcohol dependence, uncomplicated: Secondary | ICD-10-CM | POA: Diagnosis not present

## 2020-03-03 DIAGNOSIS — J45909 Unspecified asthma, uncomplicated: Secondary | ICD-10-CM | POA: Diagnosis not present

## 2020-03-03 DIAGNOSIS — M419 Scoliosis, unspecified: Secondary | ICD-10-CM | POA: Diagnosis not present

## 2020-03-03 DIAGNOSIS — G4733 Obstructive sleep apnea (adult) (pediatric): Secondary | ICD-10-CM | POA: Diagnosis not present

## 2020-03-04 DIAGNOSIS — J9601 Acute respiratory failure with hypoxia: Secondary | ICD-10-CM | POA: Diagnosis not present

## 2020-03-04 DIAGNOSIS — R062 Wheezing: Secondary | ICD-10-CM | POA: Diagnosis not present

## 2020-03-07 DIAGNOSIS — G629 Polyneuropathy, unspecified: Secondary | ICD-10-CM | POA: Diagnosis not present

## 2020-03-07 DIAGNOSIS — G4700 Insomnia, unspecified: Secondary | ICD-10-CM | POA: Diagnosis not present

## 2020-03-07 DIAGNOSIS — F1020 Alcohol dependence, uncomplicated: Secondary | ICD-10-CM | POA: Diagnosis not present

## 2020-03-07 DIAGNOSIS — R251 Tremor, unspecified: Secondary | ICD-10-CM | POA: Diagnosis not present

## 2020-03-07 DIAGNOSIS — F419 Anxiety disorder, unspecified: Secondary | ICD-10-CM | POA: Diagnosis not present

## 2020-03-07 DIAGNOSIS — I10 Essential (primary) hypertension: Secondary | ICD-10-CM | POA: Diagnosis not present

## 2020-03-07 DIAGNOSIS — K219 Gastro-esophageal reflux disease without esophagitis: Secondary | ICD-10-CM | POA: Diagnosis not present

## 2020-03-07 DIAGNOSIS — M419 Scoliosis, unspecified: Secondary | ICD-10-CM | POA: Diagnosis not present

## 2020-03-07 DIAGNOSIS — M1711 Unilateral primary osteoarthritis, right knee: Secondary | ICD-10-CM | POA: Diagnosis not present

## 2020-03-07 DIAGNOSIS — F329 Major depressive disorder, single episode, unspecified: Secondary | ICD-10-CM | POA: Diagnosis not present

## 2020-03-07 DIAGNOSIS — J45909 Unspecified asthma, uncomplicated: Secondary | ICD-10-CM | POA: Diagnosis not present

## 2020-03-07 DIAGNOSIS — G4733 Obstructive sleep apnea (adult) (pediatric): Secondary | ICD-10-CM | POA: Diagnosis not present

## 2020-03-08 DIAGNOSIS — M419 Scoliosis, unspecified: Secondary | ICD-10-CM | POA: Diagnosis not present

## 2020-03-08 DIAGNOSIS — F329 Major depressive disorder, single episode, unspecified: Secondary | ICD-10-CM | POA: Diagnosis not present

## 2020-03-08 DIAGNOSIS — I10 Essential (primary) hypertension: Secondary | ICD-10-CM | POA: Diagnosis not present

## 2020-03-08 DIAGNOSIS — G629 Polyneuropathy, unspecified: Secondary | ICD-10-CM | POA: Diagnosis not present

## 2020-03-08 DIAGNOSIS — K219 Gastro-esophageal reflux disease without esophagitis: Secondary | ICD-10-CM | POA: Diagnosis not present

## 2020-03-08 DIAGNOSIS — F419 Anxiety disorder, unspecified: Secondary | ICD-10-CM | POA: Diagnosis not present

## 2020-03-08 DIAGNOSIS — R251 Tremor, unspecified: Secondary | ICD-10-CM | POA: Diagnosis not present

## 2020-03-08 DIAGNOSIS — M1711 Unilateral primary osteoarthritis, right knee: Secondary | ICD-10-CM | POA: Diagnosis not present

## 2020-03-08 DIAGNOSIS — F1020 Alcohol dependence, uncomplicated: Secondary | ICD-10-CM | POA: Diagnosis not present

## 2020-03-08 DIAGNOSIS — J45909 Unspecified asthma, uncomplicated: Secondary | ICD-10-CM | POA: Diagnosis not present

## 2020-03-08 DIAGNOSIS — G4700 Insomnia, unspecified: Secondary | ICD-10-CM | POA: Diagnosis not present

## 2020-03-08 DIAGNOSIS — G4733 Obstructive sleep apnea (adult) (pediatric): Secondary | ICD-10-CM | POA: Diagnosis not present

## 2020-03-10 DIAGNOSIS — G4700 Insomnia, unspecified: Secondary | ICD-10-CM | POA: Diagnosis not present

## 2020-03-10 DIAGNOSIS — F329 Major depressive disorder, single episode, unspecified: Secondary | ICD-10-CM | POA: Diagnosis not present

## 2020-03-10 DIAGNOSIS — M1711 Unilateral primary osteoarthritis, right knee: Secondary | ICD-10-CM | POA: Diagnosis not present

## 2020-03-10 DIAGNOSIS — F1020 Alcohol dependence, uncomplicated: Secondary | ICD-10-CM | POA: Diagnosis not present

## 2020-03-10 DIAGNOSIS — R251 Tremor, unspecified: Secondary | ICD-10-CM | POA: Diagnosis not present

## 2020-03-10 DIAGNOSIS — G4733 Obstructive sleep apnea (adult) (pediatric): Secondary | ICD-10-CM | POA: Diagnosis not present

## 2020-03-10 DIAGNOSIS — F419 Anxiety disorder, unspecified: Secondary | ICD-10-CM | POA: Diagnosis not present

## 2020-03-10 DIAGNOSIS — K219 Gastro-esophageal reflux disease without esophagitis: Secondary | ICD-10-CM | POA: Diagnosis not present

## 2020-03-10 DIAGNOSIS — I10 Essential (primary) hypertension: Secondary | ICD-10-CM | POA: Diagnosis not present

## 2020-03-10 DIAGNOSIS — J45909 Unspecified asthma, uncomplicated: Secondary | ICD-10-CM | POA: Diagnosis not present

## 2020-03-10 DIAGNOSIS — M419 Scoliosis, unspecified: Secondary | ICD-10-CM | POA: Diagnosis not present

## 2020-03-10 DIAGNOSIS — G629 Polyneuropathy, unspecified: Secondary | ICD-10-CM | POA: Diagnosis not present

## 2020-03-14 DIAGNOSIS — G4700 Insomnia, unspecified: Secondary | ICD-10-CM | POA: Diagnosis not present

## 2020-03-14 DIAGNOSIS — F419 Anxiety disorder, unspecified: Secondary | ICD-10-CM | POA: Diagnosis not present

## 2020-03-14 DIAGNOSIS — J45909 Unspecified asthma, uncomplicated: Secondary | ICD-10-CM | POA: Diagnosis not present

## 2020-03-14 DIAGNOSIS — I10 Essential (primary) hypertension: Secondary | ICD-10-CM | POA: Diagnosis not present

## 2020-03-14 DIAGNOSIS — F1020 Alcohol dependence, uncomplicated: Secondary | ICD-10-CM | POA: Diagnosis not present

## 2020-03-14 DIAGNOSIS — M419 Scoliosis, unspecified: Secondary | ICD-10-CM | POA: Diagnosis not present

## 2020-03-14 DIAGNOSIS — G629 Polyneuropathy, unspecified: Secondary | ICD-10-CM | POA: Diagnosis not present

## 2020-03-14 DIAGNOSIS — M1711 Unilateral primary osteoarthritis, right knee: Secondary | ICD-10-CM | POA: Diagnosis not present

## 2020-03-14 DIAGNOSIS — K219 Gastro-esophageal reflux disease without esophagitis: Secondary | ICD-10-CM | POA: Diagnosis not present

## 2020-03-14 DIAGNOSIS — R251 Tremor, unspecified: Secondary | ICD-10-CM | POA: Diagnosis not present

## 2020-03-14 DIAGNOSIS — F329 Major depressive disorder, single episode, unspecified: Secondary | ICD-10-CM | POA: Diagnosis not present

## 2020-03-14 DIAGNOSIS — G4733 Obstructive sleep apnea (adult) (pediatric): Secondary | ICD-10-CM | POA: Diagnosis not present

## 2020-03-15 DIAGNOSIS — M419 Scoliosis, unspecified: Secondary | ICD-10-CM | POA: Diagnosis not present

## 2020-03-15 DIAGNOSIS — F329 Major depressive disorder, single episode, unspecified: Secondary | ICD-10-CM | POA: Diagnosis not present

## 2020-03-15 DIAGNOSIS — K219 Gastro-esophageal reflux disease without esophagitis: Secondary | ICD-10-CM | POA: Diagnosis not present

## 2020-03-15 DIAGNOSIS — M1711 Unilateral primary osteoarthritis, right knee: Secondary | ICD-10-CM | POA: Diagnosis not present

## 2020-03-15 DIAGNOSIS — G629 Polyneuropathy, unspecified: Secondary | ICD-10-CM | POA: Diagnosis not present

## 2020-03-15 DIAGNOSIS — R251 Tremor, unspecified: Secondary | ICD-10-CM | POA: Diagnosis not present

## 2020-03-15 DIAGNOSIS — F1020 Alcohol dependence, uncomplicated: Secondary | ICD-10-CM | POA: Diagnosis not present

## 2020-03-15 DIAGNOSIS — I10 Essential (primary) hypertension: Secondary | ICD-10-CM | POA: Diagnosis not present

## 2020-03-15 DIAGNOSIS — F419 Anxiety disorder, unspecified: Secondary | ICD-10-CM | POA: Diagnosis not present

## 2020-03-15 DIAGNOSIS — J45909 Unspecified asthma, uncomplicated: Secondary | ICD-10-CM | POA: Diagnosis not present

## 2020-03-15 DIAGNOSIS — G4700 Insomnia, unspecified: Secondary | ICD-10-CM | POA: Diagnosis not present

## 2020-03-15 DIAGNOSIS — G4733 Obstructive sleep apnea (adult) (pediatric): Secondary | ICD-10-CM | POA: Diagnosis not present

## 2020-03-17 DIAGNOSIS — F329 Major depressive disorder, single episode, unspecified: Secondary | ICD-10-CM | POA: Diagnosis not present

## 2020-03-17 DIAGNOSIS — M1711 Unilateral primary osteoarthritis, right knee: Secondary | ICD-10-CM | POA: Diagnosis not present

## 2020-03-17 DIAGNOSIS — G4733 Obstructive sleep apnea (adult) (pediatric): Secondary | ICD-10-CM | POA: Diagnosis not present

## 2020-03-17 DIAGNOSIS — G4700 Insomnia, unspecified: Secondary | ICD-10-CM | POA: Diagnosis not present

## 2020-03-17 DIAGNOSIS — F419 Anxiety disorder, unspecified: Secondary | ICD-10-CM | POA: Diagnosis not present

## 2020-03-17 DIAGNOSIS — M419 Scoliosis, unspecified: Secondary | ICD-10-CM | POA: Diagnosis not present

## 2020-03-17 DIAGNOSIS — F1020 Alcohol dependence, uncomplicated: Secondary | ICD-10-CM | POA: Diagnosis not present

## 2020-03-17 DIAGNOSIS — K219 Gastro-esophageal reflux disease without esophagitis: Secondary | ICD-10-CM | POA: Diagnosis not present

## 2020-03-17 DIAGNOSIS — R251 Tremor, unspecified: Secondary | ICD-10-CM | POA: Diagnosis not present

## 2020-03-17 DIAGNOSIS — I10 Essential (primary) hypertension: Secondary | ICD-10-CM | POA: Diagnosis not present

## 2020-03-17 DIAGNOSIS — J45909 Unspecified asthma, uncomplicated: Secondary | ICD-10-CM | POA: Diagnosis not present

## 2020-03-17 DIAGNOSIS — G629 Polyneuropathy, unspecified: Secondary | ICD-10-CM | POA: Diagnosis not present

## 2020-03-21 DIAGNOSIS — K219 Gastro-esophageal reflux disease without esophagitis: Secondary | ICD-10-CM | POA: Diagnosis not present

## 2020-03-21 DIAGNOSIS — G629 Polyneuropathy, unspecified: Secondary | ICD-10-CM | POA: Diagnosis not present

## 2020-03-21 DIAGNOSIS — M419 Scoliosis, unspecified: Secondary | ICD-10-CM | POA: Diagnosis not present

## 2020-03-21 DIAGNOSIS — M1711 Unilateral primary osteoarthritis, right knee: Secondary | ICD-10-CM | POA: Diagnosis not present

## 2020-03-21 DIAGNOSIS — G4733 Obstructive sleep apnea (adult) (pediatric): Secondary | ICD-10-CM | POA: Diagnosis not present

## 2020-03-21 DIAGNOSIS — F1020 Alcohol dependence, uncomplicated: Secondary | ICD-10-CM | POA: Diagnosis not present

## 2020-03-21 DIAGNOSIS — J45909 Unspecified asthma, uncomplicated: Secondary | ICD-10-CM | POA: Diagnosis not present

## 2020-03-21 DIAGNOSIS — F419 Anxiety disorder, unspecified: Secondary | ICD-10-CM | POA: Diagnosis not present

## 2020-03-21 DIAGNOSIS — R251 Tremor, unspecified: Secondary | ICD-10-CM | POA: Diagnosis not present

## 2020-03-21 DIAGNOSIS — F329 Major depressive disorder, single episode, unspecified: Secondary | ICD-10-CM | POA: Diagnosis not present

## 2020-03-21 DIAGNOSIS — I10 Essential (primary) hypertension: Secondary | ICD-10-CM | POA: Diagnosis not present

## 2020-03-21 DIAGNOSIS — G4700 Insomnia, unspecified: Secondary | ICD-10-CM | POA: Diagnosis not present

## 2020-03-22 DIAGNOSIS — J45909 Unspecified asthma, uncomplicated: Secondary | ICD-10-CM | POA: Diagnosis not present

## 2020-03-22 DIAGNOSIS — I10 Essential (primary) hypertension: Secondary | ICD-10-CM | POA: Diagnosis not present

## 2020-03-22 DIAGNOSIS — R251 Tremor, unspecified: Secondary | ICD-10-CM | POA: Diagnosis not present

## 2020-03-22 DIAGNOSIS — M1711 Unilateral primary osteoarthritis, right knee: Secondary | ICD-10-CM | POA: Diagnosis not present

## 2020-03-22 DIAGNOSIS — G4700 Insomnia, unspecified: Secondary | ICD-10-CM | POA: Diagnosis not present

## 2020-03-22 DIAGNOSIS — G4733 Obstructive sleep apnea (adult) (pediatric): Secondary | ICD-10-CM | POA: Diagnosis not present

## 2020-03-22 DIAGNOSIS — K219 Gastro-esophageal reflux disease without esophagitis: Secondary | ICD-10-CM | POA: Diagnosis not present

## 2020-03-22 DIAGNOSIS — F329 Major depressive disorder, single episode, unspecified: Secondary | ICD-10-CM | POA: Diagnosis not present

## 2020-03-22 DIAGNOSIS — F1020 Alcohol dependence, uncomplicated: Secondary | ICD-10-CM | POA: Diagnosis not present

## 2020-03-22 DIAGNOSIS — M419 Scoliosis, unspecified: Secondary | ICD-10-CM | POA: Diagnosis not present

## 2020-03-22 DIAGNOSIS — G629 Polyneuropathy, unspecified: Secondary | ICD-10-CM | POA: Diagnosis not present

## 2020-03-22 DIAGNOSIS — F419 Anxiety disorder, unspecified: Secondary | ICD-10-CM | POA: Diagnosis not present

## 2020-03-24 DIAGNOSIS — M1711 Unilateral primary osteoarthritis, right knee: Secondary | ICD-10-CM | POA: Diagnosis not present

## 2020-03-24 DIAGNOSIS — F329 Major depressive disorder, single episode, unspecified: Secondary | ICD-10-CM | POA: Diagnosis not present

## 2020-03-24 DIAGNOSIS — M419 Scoliosis, unspecified: Secondary | ICD-10-CM | POA: Diagnosis not present

## 2020-03-24 DIAGNOSIS — J45909 Unspecified asthma, uncomplicated: Secondary | ICD-10-CM | POA: Diagnosis not present

## 2020-03-24 DIAGNOSIS — K219 Gastro-esophageal reflux disease without esophagitis: Secondary | ICD-10-CM | POA: Diagnosis not present

## 2020-03-24 DIAGNOSIS — R251 Tremor, unspecified: Secondary | ICD-10-CM | POA: Diagnosis not present

## 2020-03-24 DIAGNOSIS — G629 Polyneuropathy, unspecified: Secondary | ICD-10-CM | POA: Diagnosis not present

## 2020-03-24 DIAGNOSIS — F419 Anxiety disorder, unspecified: Secondary | ICD-10-CM | POA: Diagnosis not present

## 2020-03-24 DIAGNOSIS — G4700 Insomnia, unspecified: Secondary | ICD-10-CM | POA: Diagnosis not present

## 2020-03-24 DIAGNOSIS — I10 Essential (primary) hypertension: Secondary | ICD-10-CM | POA: Diagnosis not present

## 2020-03-24 DIAGNOSIS — F1020 Alcohol dependence, uncomplicated: Secondary | ICD-10-CM | POA: Diagnosis not present

## 2020-03-24 DIAGNOSIS — G4733 Obstructive sleep apnea (adult) (pediatric): Secondary | ICD-10-CM | POA: Diagnosis not present

## 2020-03-28 ENCOUNTER — Other Ambulatory Visit: Payer: Self-pay | Admitting: Family Medicine

## 2020-03-28 DIAGNOSIS — F419 Anxiety disorder, unspecified: Secondary | ICD-10-CM | POA: Diagnosis not present

## 2020-03-28 DIAGNOSIS — F1020 Alcohol dependence, uncomplicated: Secondary | ICD-10-CM | POA: Diagnosis not present

## 2020-03-28 DIAGNOSIS — I10 Essential (primary) hypertension: Secondary | ICD-10-CM | POA: Diagnosis not present

## 2020-03-28 DIAGNOSIS — G4733 Obstructive sleep apnea (adult) (pediatric): Secondary | ICD-10-CM | POA: Diagnosis not present

## 2020-03-28 DIAGNOSIS — K219 Gastro-esophageal reflux disease without esophagitis: Secondary | ICD-10-CM | POA: Diagnosis not present

## 2020-03-28 DIAGNOSIS — J45909 Unspecified asthma, uncomplicated: Secondary | ICD-10-CM | POA: Diagnosis not present

## 2020-03-28 DIAGNOSIS — M1711 Unilateral primary osteoarthritis, right knee: Secondary | ICD-10-CM | POA: Diagnosis not present

## 2020-03-28 DIAGNOSIS — F329 Major depressive disorder, single episode, unspecified: Secondary | ICD-10-CM | POA: Diagnosis not present

## 2020-03-28 DIAGNOSIS — G629 Polyneuropathy, unspecified: Secondary | ICD-10-CM | POA: Diagnosis not present

## 2020-03-28 DIAGNOSIS — R251 Tremor, unspecified: Secondary | ICD-10-CM | POA: Diagnosis not present

## 2020-03-28 DIAGNOSIS — M419 Scoliosis, unspecified: Secondary | ICD-10-CM | POA: Diagnosis not present

## 2020-03-28 DIAGNOSIS — G4700 Insomnia, unspecified: Secondary | ICD-10-CM | POA: Diagnosis not present

## 2020-03-29 DIAGNOSIS — F1020 Alcohol dependence, uncomplicated: Secondary | ICD-10-CM | POA: Diagnosis not present

## 2020-03-29 DIAGNOSIS — G629 Polyneuropathy, unspecified: Secondary | ICD-10-CM | POA: Diagnosis not present

## 2020-03-29 DIAGNOSIS — K219 Gastro-esophageal reflux disease without esophagitis: Secondary | ICD-10-CM | POA: Diagnosis not present

## 2020-03-29 DIAGNOSIS — F329 Major depressive disorder, single episode, unspecified: Secondary | ICD-10-CM | POA: Diagnosis not present

## 2020-03-29 DIAGNOSIS — M419 Scoliosis, unspecified: Secondary | ICD-10-CM | POA: Diagnosis not present

## 2020-03-29 DIAGNOSIS — I10 Essential (primary) hypertension: Secondary | ICD-10-CM | POA: Diagnosis not present

## 2020-03-29 DIAGNOSIS — G4733 Obstructive sleep apnea (adult) (pediatric): Secondary | ICD-10-CM | POA: Diagnosis not present

## 2020-03-29 DIAGNOSIS — R251 Tremor, unspecified: Secondary | ICD-10-CM | POA: Diagnosis not present

## 2020-03-29 DIAGNOSIS — J45909 Unspecified asthma, uncomplicated: Secondary | ICD-10-CM | POA: Diagnosis not present

## 2020-03-29 DIAGNOSIS — G4700 Insomnia, unspecified: Secondary | ICD-10-CM | POA: Diagnosis not present

## 2020-03-29 DIAGNOSIS — F419 Anxiety disorder, unspecified: Secondary | ICD-10-CM | POA: Diagnosis not present

## 2020-03-29 DIAGNOSIS — M1711 Unilateral primary osteoarthritis, right knee: Secondary | ICD-10-CM | POA: Diagnosis not present

## 2020-03-31 DIAGNOSIS — I10 Essential (primary) hypertension: Secondary | ICD-10-CM | POA: Diagnosis not present

## 2020-03-31 DIAGNOSIS — G4700 Insomnia, unspecified: Secondary | ICD-10-CM | POA: Diagnosis not present

## 2020-03-31 DIAGNOSIS — R251 Tremor, unspecified: Secondary | ICD-10-CM | POA: Diagnosis not present

## 2020-03-31 DIAGNOSIS — G629 Polyneuropathy, unspecified: Secondary | ICD-10-CM | POA: Diagnosis not present

## 2020-03-31 DIAGNOSIS — F1020 Alcohol dependence, uncomplicated: Secondary | ICD-10-CM | POA: Diagnosis not present

## 2020-03-31 DIAGNOSIS — G4733 Obstructive sleep apnea (adult) (pediatric): Secondary | ICD-10-CM | POA: Diagnosis not present

## 2020-03-31 DIAGNOSIS — M1711 Unilateral primary osteoarthritis, right knee: Secondary | ICD-10-CM | POA: Diagnosis not present

## 2020-03-31 DIAGNOSIS — K219 Gastro-esophageal reflux disease without esophagitis: Secondary | ICD-10-CM | POA: Diagnosis not present

## 2020-03-31 DIAGNOSIS — F329 Major depressive disorder, single episode, unspecified: Secondary | ICD-10-CM | POA: Diagnosis not present

## 2020-03-31 DIAGNOSIS — J45909 Unspecified asthma, uncomplicated: Secondary | ICD-10-CM | POA: Diagnosis not present

## 2020-03-31 DIAGNOSIS — F419 Anxiety disorder, unspecified: Secondary | ICD-10-CM | POA: Diagnosis not present

## 2020-03-31 DIAGNOSIS — M419 Scoliosis, unspecified: Secondary | ICD-10-CM | POA: Diagnosis not present

## 2020-04-04 ENCOUNTER — Telehealth: Payer: Self-pay | Admitting: Family Medicine

## 2020-04-04 DIAGNOSIS — I10 Essential (primary) hypertension: Secondary | ICD-10-CM | POA: Diagnosis not present

## 2020-04-04 DIAGNOSIS — F1020 Alcohol dependence, uncomplicated: Secondary | ICD-10-CM | POA: Diagnosis not present

## 2020-04-04 DIAGNOSIS — M1711 Unilateral primary osteoarthritis, right knee: Secondary | ICD-10-CM | POA: Diagnosis not present

## 2020-04-04 DIAGNOSIS — G4700 Insomnia, unspecified: Secondary | ICD-10-CM | POA: Diagnosis not present

## 2020-04-04 DIAGNOSIS — F329 Major depressive disorder, single episode, unspecified: Secondary | ICD-10-CM | POA: Diagnosis not present

## 2020-04-04 DIAGNOSIS — R062 Wheezing: Secondary | ICD-10-CM | POA: Diagnosis not present

## 2020-04-04 DIAGNOSIS — J9601 Acute respiratory failure with hypoxia: Secondary | ICD-10-CM | POA: Diagnosis not present

## 2020-04-04 DIAGNOSIS — G629 Polyneuropathy, unspecified: Secondary | ICD-10-CM | POA: Diagnosis not present

## 2020-04-04 DIAGNOSIS — J45909 Unspecified asthma, uncomplicated: Secondary | ICD-10-CM | POA: Diagnosis not present

## 2020-04-04 DIAGNOSIS — R251 Tremor, unspecified: Secondary | ICD-10-CM | POA: Diagnosis not present

## 2020-04-04 DIAGNOSIS — F419 Anxiety disorder, unspecified: Secondary | ICD-10-CM | POA: Diagnosis not present

## 2020-04-04 DIAGNOSIS — G4733 Obstructive sleep apnea (adult) (pediatric): Secondary | ICD-10-CM | POA: Diagnosis not present

## 2020-04-04 DIAGNOSIS — K219 Gastro-esophageal reflux disease without esophagitis: Secondary | ICD-10-CM | POA: Diagnosis not present

## 2020-04-04 DIAGNOSIS — M419 Scoliosis, unspecified: Secondary | ICD-10-CM | POA: Diagnosis not present

## 2020-04-05 DIAGNOSIS — G629 Polyneuropathy, unspecified: Secondary | ICD-10-CM | POA: Diagnosis not present

## 2020-04-05 DIAGNOSIS — F1020 Alcohol dependence, uncomplicated: Secondary | ICD-10-CM | POA: Diagnosis not present

## 2020-04-05 DIAGNOSIS — I10 Essential (primary) hypertension: Secondary | ICD-10-CM | POA: Diagnosis not present

## 2020-04-05 DIAGNOSIS — M1711 Unilateral primary osteoarthritis, right knee: Secondary | ICD-10-CM | POA: Diagnosis not present

## 2020-04-05 DIAGNOSIS — G4733 Obstructive sleep apnea (adult) (pediatric): Secondary | ICD-10-CM | POA: Diagnosis not present

## 2020-04-05 DIAGNOSIS — F329 Major depressive disorder, single episode, unspecified: Secondary | ICD-10-CM | POA: Diagnosis not present

## 2020-04-05 DIAGNOSIS — G4700 Insomnia, unspecified: Secondary | ICD-10-CM | POA: Diagnosis not present

## 2020-04-05 DIAGNOSIS — F419 Anxiety disorder, unspecified: Secondary | ICD-10-CM | POA: Diagnosis not present

## 2020-04-05 DIAGNOSIS — M419 Scoliosis, unspecified: Secondary | ICD-10-CM | POA: Diagnosis not present

## 2020-04-05 DIAGNOSIS — R251 Tremor, unspecified: Secondary | ICD-10-CM | POA: Diagnosis not present

## 2020-04-05 DIAGNOSIS — K219 Gastro-esophageal reflux disease without esophagitis: Secondary | ICD-10-CM | POA: Diagnosis not present

## 2020-04-05 DIAGNOSIS — J45909 Unspecified asthma, uncomplicated: Secondary | ICD-10-CM | POA: Diagnosis not present

## 2020-04-05 NOTE — Telephone Encounter (Signed)
Pt called in stating that he talked to Dr. Carlota Raspberry about taking over the  hydroCHLOROthiazide 12.5 MG  He was going to do this for him. Pt only has one more day of this medication.  Please advise     [facility] [address]

## 2020-04-06 MED ORDER — HYDROCHLOROTHIAZIDE 12.5 MG PO TABS: 12.5000 mg | ORAL_TABLET | Freq: Every day | ORAL | 1 refills | Status: DC

## 2020-04-06 NOTE — Telephone Encounter (Signed)
Patient states he was suppose to be switching to hydrochlorothiazide 12.5mg  last visit was for HTN .

## 2020-04-06 NOTE — Addendum Note (Signed)
Addended by: Merri Ray R on: 04/06/2020 01:43 PM   Modules accepted: Orders

## 2020-04-06 NOTE — Telephone Encounter (Signed)
Ordered

## 2020-04-07 DIAGNOSIS — G4733 Obstructive sleep apnea (adult) (pediatric): Secondary | ICD-10-CM | POA: Diagnosis not present

## 2020-04-07 DIAGNOSIS — R251 Tremor, unspecified: Secondary | ICD-10-CM | POA: Diagnosis not present

## 2020-04-07 DIAGNOSIS — F1020 Alcohol dependence, uncomplicated: Secondary | ICD-10-CM | POA: Diagnosis not present

## 2020-04-07 DIAGNOSIS — G4700 Insomnia, unspecified: Secondary | ICD-10-CM | POA: Diagnosis not present

## 2020-04-07 DIAGNOSIS — F329 Major depressive disorder, single episode, unspecified: Secondary | ICD-10-CM | POA: Diagnosis not present

## 2020-04-07 DIAGNOSIS — G629 Polyneuropathy, unspecified: Secondary | ICD-10-CM | POA: Diagnosis not present

## 2020-04-07 DIAGNOSIS — I10 Essential (primary) hypertension: Secondary | ICD-10-CM | POA: Diagnosis not present

## 2020-04-07 DIAGNOSIS — J45909 Unspecified asthma, uncomplicated: Secondary | ICD-10-CM | POA: Diagnosis not present

## 2020-04-07 DIAGNOSIS — M1711 Unilateral primary osteoarthritis, right knee: Secondary | ICD-10-CM | POA: Diagnosis not present

## 2020-04-07 DIAGNOSIS — F419 Anxiety disorder, unspecified: Secondary | ICD-10-CM | POA: Diagnosis not present

## 2020-04-07 DIAGNOSIS — K219 Gastro-esophageal reflux disease without esophagitis: Secondary | ICD-10-CM | POA: Diagnosis not present

## 2020-04-07 DIAGNOSIS — M419 Scoliosis, unspecified: Secondary | ICD-10-CM | POA: Diagnosis not present

## 2020-04-11 DIAGNOSIS — F1020 Alcohol dependence, uncomplicated: Secondary | ICD-10-CM | POA: Diagnosis not present

## 2020-04-11 DIAGNOSIS — I10 Essential (primary) hypertension: Secondary | ICD-10-CM | POA: Diagnosis not present

## 2020-04-11 DIAGNOSIS — M419 Scoliosis, unspecified: Secondary | ICD-10-CM | POA: Diagnosis not present

## 2020-04-11 DIAGNOSIS — G629 Polyneuropathy, unspecified: Secondary | ICD-10-CM | POA: Diagnosis not present

## 2020-04-11 DIAGNOSIS — R251 Tremor, unspecified: Secondary | ICD-10-CM | POA: Diagnosis not present

## 2020-04-11 DIAGNOSIS — G4733 Obstructive sleep apnea (adult) (pediatric): Secondary | ICD-10-CM | POA: Diagnosis not present

## 2020-04-11 DIAGNOSIS — K219 Gastro-esophageal reflux disease without esophagitis: Secondary | ICD-10-CM | POA: Diagnosis not present

## 2020-04-11 DIAGNOSIS — M1711 Unilateral primary osteoarthritis, right knee: Secondary | ICD-10-CM | POA: Diagnosis not present

## 2020-04-11 DIAGNOSIS — J45909 Unspecified asthma, uncomplicated: Secondary | ICD-10-CM | POA: Diagnosis not present

## 2020-04-11 DIAGNOSIS — G4700 Insomnia, unspecified: Secondary | ICD-10-CM | POA: Diagnosis not present

## 2020-04-11 DIAGNOSIS — F419 Anxiety disorder, unspecified: Secondary | ICD-10-CM | POA: Diagnosis not present

## 2020-04-11 DIAGNOSIS — F329 Major depressive disorder, single episode, unspecified: Secondary | ICD-10-CM | POA: Diagnosis not present

## 2020-04-12 DIAGNOSIS — F419 Anxiety disorder, unspecified: Secondary | ICD-10-CM | POA: Diagnosis not present

## 2020-04-12 DIAGNOSIS — G4733 Obstructive sleep apnea (adult) (pediatric): Secondary | ICD-10-CM | POA: Diagnosis not present

## 2020-04-12 DIAGNOSIS — G4700 Insomnia, unspecified: Secondary | ICD-10-CM | POA: Diagnosis not present

## 2020-04-12 DIAGNOSIS — I10 Essential (primary) hypertension: Secondary | ICD-10-CM | POA: Diagnosis not present

## 2020-04-12 DIAGNOSIS — F1020 Alcohol dependence, uncomplicated: Secondary | ICD-10-CM | POA: Diagnosis not present

## 2020-04-12 DIAGNOSIS — M419 Scoliosis, unspecified: Secondary | ICD-10-CM | POA: Diagnosis not present

## 2020-04-12 DIAGNOSIS — J45909 Unspecified asthma, uncomplicated: Secondary | ICD-10-CM | POA: Diagnosis not present

## 2020-04-12 DIAGNOSIS — M1711 Unilateral primary osteoarthritis, right knee: Secondary | ICD-10-CM | POA: Diagnosis not present

## 2020-04-12 DIAGNOSIS — G629 Polyneuropathy, unspecified: Secondary | ICD-10-CM | POA: Diagnosis not present

## 2020-04-12 DIAGNOSIS — K219 Gastro-esophageal reflux disease without esophagitis: Secondary | ICD-10-CM | POA: Diagnosis not present

## 2020-04-12 DIAGNOSIS — R251 Tremor, unspecified: Secondary | ICD-10-CM | POA: Diagnosis not present

## 2020-04-12 DIAGNOSIS — F329 Major depressive disorder, single episode, unspecified: Secondary | ICD-10-CM | POA: Diagnosis not present

## 2020-04-18 DIAGNOSIS — G4700 Insomnia, unspecified: Secondary | ICD-10-CM | POA: Diagnosis not present

## 2020-04-18 DIAGNOSIS — F329 Major depressive disorder, single episode, unspecified: Secondary | ICD-10-CM | POA: Diagnosis not present

## 2020-04-18 DIAGNOSIS — F1020 Alcohol dependence, uncomplicated: Secondary | ICD-10-CM | POA: Diagnosis not present

## 2020-04-18 DIAGNOSIS — F419 Anxiety disorder, unspecified: Secondary | ICD-10-CM | POA: Diagnosis not present

## 2020-05-02 DIAGNOSIS — F419 Anxiety disorder, unspecified: Secondary | ICD-10-CM | POA: Diagnosis not present

## 2020-05-02 DIAGNOSIS — F1020 Alcohol dependence, uncomplicated: Secondary | ICD-10-CM | POA: Diagnosis not present

## 2020-05-02 DIAGNOSIS — F329 Major depressive disorder, single episode, unspecified: Secondary | ICD-10-CM | POA: Diagnosis not present

## 2020-05-04 DIAGNOSIS — J9601 Acute respiratory failure with hypoxia: Secondary | ICD-10-CM | POA: Diagnosis not present

## 2020-05-04 DIAGNOSIS — R062 Wheezing: Secondary | ICD-10-CM | POA: Diagnosis not present

## 2020-05-09 DIAGNOSIS — F329 Major depressive disorder, single episode, unspecified: Secondary | ICD-10-CM | POA: Diagnosis not present

## 2020-05-23 DIAGNOSIS — F1020 Alcohol dependence, uncomplicated: Secondary | ICD-10-CM | POA: Diagnosis not present

## 2020-05-23 DIAGNOSIS — F329 Major depressive disorder, single episode, unspecified: Secondary | ICD-10-CM | POA: Diagnosis not present

## 2020-05-23 DIAGNOSIS — F419 Anxiety disorder, unspecified: Secondary | ICD-10-CM | POA: Diagnosis not present

## 2020-05-24 DIAGNOSIS — H04123 Dry eye syndrome of bilateral lacrimal glands: Secondary | ICD-10-CM | POA: Diagnosis not present

## 2020-05-24 DIAGNOSIS — H2513 Age-related nuclear cataract, bilateral: Secondary | ICD-10-CM | POA: Diagnosis not present

## 2020-05-24 DIAGNOSIS — H0102A Squamous blepharitis right eye, upper and lower eyelids: Secondary | ICD-10-CM | POA: Diagnosis not present

## 2020-05-24 DIAGNOSIS — H10413 Chronic giant papillary conjunctivitis, bilateral: Secondary | ICD-10-CM | POA: Diagnosis not present

## 2020-05-31 ENCOUNTER — Other Ambulatory Visit: Payer: Self-pay | Admitting: Family Medicine

## 2020-06-01 ENCOUNTER — Ambulatory Visit (INDEPENDENT_AMBULATORY_CARE_PROVIDER_SITE_OTHER): Payer: BC Managed Care – PPO | Admitting: Otolaryngology

## 2020-06-01 ENCOUNTER — Other Ambulatory Visit: Payer: Self-pay

## 2020-06-01 VITALS — Temp 97.3°F

## 2020-06-01 DIAGNOSIS — H9313 Tinnitus, bilateral: Secondary | ICD-10-CM | POA: Diagnosis not present

## 2020-06-01 DIAGNOSIS — H903 Sensorineural hearing loss, bilateral: Secondary | ICD-10-CM | POA: Diagnosis not present

## 2020-06-01 NOTE — Progress Notes (Signed)
HPI: Louis [name] is a 65 y.o. male who presents is referred by his PCP for evaluation of tinnitus in both ears that is gradually developed over the past year or 2.  He does wear nasal CPAP at night.  Denies any excessive noise exposure.  He denies any trouble with his hearing..  Past Medical History:  Diagnosis Date  . Alcohol use disorder, mild, abuse   . Anemia   . Anxiety    situational  . Arthritis    Phreesia 02/21/2020  . Asthma    Phreesia 02/21/2020  . BPH (benign prostatic hyperplasia)    on flomax- Patien tand wife denies- and patient has never been on Flomax  . Bundle branch block    per prior PCP records  . CHF (congestive heart failure) ([place])   . Chronic kidney disease    Acute Renal Failure- June 2021- admission- kidneys recovered quickly  . Complication of anesthesia 03/11/2019   woke up before surgery was over- "paralytic"  . COPD (chronic obstructive pulmonary disease) ([place])   . Diverticulosis    by colonoscopy  . Eczema    per prior PCP records  . Elevated PSA 2015   peaked 6s, s/p benign biopsy 2014, sees urology [name] ([place])  . Essential tremor   . GERD (gastroesophageal reflux disease)    per prior PCP records  . History of panic attacks    per prior PCP records  . HTN (hypertension)   . Hypertension    Phreesia 02/21/2020  . Mild intermittent asthma in adult without complication   . Obesity, Class I, BMI 30-34.9   . Osteoporosis    DEXA 06/2013 with osteopenia - h/o ?wrist/hip fracture from AVN from chronic steroid use (asthma), took reclast for 1 year  . Pneumonia    10/08/19 >10 years ago  . Seasonal allergies   . Sleep apnea   . Substance abuse ([place])    Phreesia 02/21/2020  . Thoracic scoliosis childhood  . Transaminitis    per prior PCP records  . Vitamin B 12 deficiency    Past Surgical History:  Procedure Laterality Date  . APPLICATION OF WOUND VAC Left 05/09/2018   Procedure: Application Of Wound Vac;  Surgeon: Newt Minion, MD;  Location: [facility];  Service: Orthopedics;  Laterality: Left;  . BIOPSY  04/30/2019   Procedure: BIOPSY;  Surgeon: Juanita Craver, MD;  [facility];  Service: Endoscopy;;  . COLONOSCOPY  12/2013   polyps, int hem, diverticulosis, rpt 5 yrs ([place])  . COLONOSCOPY WITH PROPOFOL N/A 04/30/2019   Procedure: COLONOSCOPY WITH PROPOFOL;  Surgeon: Juanita Craver, MD;  [facility];  Service: Endoscopy;  Laterality: N/A;  . ELBOW SURGERY Right 2008   golfer's elbow  . ESOPHAGOGASTRODUODENOSCOPY (EGD) WITH PROPOFOL N/A 04/30/2019   Procedure: ESOPHAGOGASTRODUODENOSCOPY (EGD) WITH PROPOFOL;  Surgeon: Juanita Craver, MD;  [facility];  Service: Endoscopy;  Laterality: N/A;  . HERNIA REPAIR N/A    Phreesia 02/21/2020  . INCISIONAL HERNIA REPAIR N/A 10/15/2019   Procedure: HERNIA REPAIR INCISIONAL WITH MESH, TAR;  Surgeon: Ralene Ok, MD;  Location: [place];  Service: General;  Laterality: N/A;  . INGUINAL HERNIA REPAIR Bilateral childhood & ~1995  . LAPAROTOMY N/A 01/05/2019   Procedure: EXPLORATORY LAPAROTOMY graham patch repair;  Surgeon: Benjamine Sprague, DO;  Location: [facility];  Service: General;  Laterality: N/A;  . LAPAROTOMY N/A 10/15/2019   Procedure: EXPLORATORY LAPAROTOMY;  Surgeon: Ralene Ok, MD;  Location: [place];  Service: General;  Laterality: N/A;  . LYSIS OF ADHESION N/A 10/15/2019   Procedure: LYSIS OF ADHESION;  Surgeon: Ralene Ok, MD;  Location: [place];  Service: General;  Laterality: N/A;  . NASAL SEPTUM SURGERY  04/01/2011   deviated septum  . ORIF ANKLE FRACTURE Left 05/09/2018   Procedure: OPEN REDUCTION INTERNAL FIXATION LEFT LISFRANC FRACTURE/DISLOCATION;  Surgeon: Newt Minion, MD;  Location: [place];  Service: Orthopedics;  Laterality: Left;  . POLYPECTOMY  04/30/2019   Procedure: POLYPECTOMY;  Surgeon: Juanita Craver, MD;  [facility];  Service: Endoscopy;;  . TEE WITHOUT CARDIOVERSION N/A 06/29/2019   Procedure: TRANSESOPHAGEAL  ECHOCARDIOGRAM (TEE);  Surgeon: Acie Fredrickson Wonda Cheng, MD;  Location: [facility];  Service: Cardiovascular;  Laterality: N/A;  . US ECHOCARDIOGRAPHY  02/2009   WNL, EF >55% Claiborne Billings)  . US RENAL/AORTA Right 05/2009   1-59% diameter reduction renal artery, rec rpt 2 yrs  . VASECTOMY N/A    Phreesia 02/21/2020   Social History   Socioeconomic History  . Marital status: Married    Spouse name: Not on file  . Number of children: Not on file  . Years of education: Not on file  . Highest education level: Not on file  Occupational History  . Not on file  Tobacco Use  . Smoking status: Never Smoker  . Smokeless tobacco: Never Used  Vaping Use  . Vaping Use: Never used  Substance and Sexual Activity  . Alcohol use: Not Currently  . Drug use: No  . Sexual activity: Yes    Birth control/protection: None  Other Topics Concern  . 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 Cancer Father 66       colon  . Prostate cancer Father   . Diabetes Mother   . Alcohol abuse Mother   . Stroke Mother   . Cancer Maternal Grandmother        pancreatic  . Ataxia Brother   . Ataxia Sister   . CAD Neg Hx    Allergies  Allergen Reactions  . Accupril [Quinapril Hcl] Cough  . Nsaids     Perforated gastric ulcer  . Other    Prior to Admission medications   Medication Sig Start Date End Date Taking? Authorizing Provider  acetaminophen (TYLENOL) 500 MG tablet Take 500 mg by mouth every 6 (six)  hours as needed for mild pain.    [provider]  amLODipine (NORVASC) 10 MG tablet Take 1 tablet (10 mg total) by mouth daily. 02/24/20   Wendie Agreste, MD  diclofenac Sodium (VOLTAREN) 1 % GEL Apply topically 4 (four) times daily.    [provider]  folic acid (FOLVITE) 1 MG tablet TAKE 1 TABLET BY MOUTH EVERY DAY 11/25/19   Wendie Agreste, MD  gabapentin (NEURONTIN) 300 MG capsule Take 1 capsule (300 mg total) by mouth 2 (two) times daily. 10/26/19   Kathrynn Ducking, MD  hydrochlorothiazide (HYDRODIURIL) 12.5 MG tablet Take 1 tablet (12.5 mg total) by mouth daily. 04/06/20   Wendie Agreste, MD  losartan (COZAAR) 100 MG tablet TAKE 1 TABLET(100 MG) BY MOUTH DAILY 02/15/20   Troy Sine, MD  metoprolol succinate (TOPROL-XL) 50 MG 24 hr tablet TAKE 1 TABLET(50 MG) BY MOUTH DAILY WITH A MEAL 05/31/20   Wendie Agreste, MD  Multiple Vitamin (MULTIVITAMIN WITH MINERALS) TABS tablet Take 1 tablet by mouth daily. 07/08/19   Terrilee Croak, MD  naltrexone (DEPADE) 50 MG tablet Take 1 tablet (50 mg total) by mouth daily. 02/24/20   Wendie Agreste, MD  omeprazole (PRILOSEC) 20 MG capsule Take 20 mg by mouth daily.    [provider]  traZODone (DESYREL) 50 MG tablet Take 1 tablet (50 mg total) by mouth at bedtime. 02/24/20   Wendie Agreste, MD     Positive ROS: Otherwise negative  All other systems have been reviewed and were otherwise negative with the exception of those mentioned in the HPI and as above.  Physical Exam: Constitutional: Alert, well-appearing, no acute distress Ears: External ears without lesions or tenderness. Ear canals are clear bilaterally with intact, clear TMs bilaterally. Nasal: External nose without lesions. Septum with minimal deformity and mild rhinitis. Clear nasal passages Oral: Lips and gums without lesions. Tongue and palate mucosa without lesions. Posterior oropharynx clear. Neck: No palpable adenopathy or masses Respiratory:  Breathing comfortably  Skin: No facial/neck lesions or rash noted.  Audiologic testing demonstrated normal hearing in the low and mid frequencies with a moderate high-frequency sensorineural hearing loss in both ears above 6000 frequency.  This is consistent with presbycusis.  SRT's were 10 DB on the right and 15 DB on the left.  He had type A tympanograms bilaterally.  Procedures  Assessment: Bilateral tinnitus secondary to presbycusis  Plan: I reviewed the audiogram with the patient in the office today. Reviewed with patient concerning limited options for treatment of tinnitus.   Cautioned him about avoiding loud noise exposure or using ear protection when around loud noise. Also discussed using masking noise to help with the tinnitus when it is bothersome. I gave him samples of Lipo flavonoid to try as this is beneficial in some people with tinnitus. He will follow-up as needed.   Radene Journey, MD   CC:

## 2020-06-04 DIAGNOSIS — J9601 Acute respiratory failure with hypoxia: Secondary | ICD-10-CM | POA: Diagnosis not present

## 2020-06-04 DIAGNOSIS — R062 Wheezing: Secondary | ICD-10-CM | POA: Diagnosis not present

## 2020-06-06 DIAGNOSIS — F329 Major depressive disorder, single episode, unspecified: Secondary | ICD-10-CM | POA: Diagnosis not present

## 2020-06-06 DIAGNOSIS — F419 Anxiety disorder, unspecified: Secondary | ICD-10-CM | POA: Diagnosis not present

## 2020-06-06 DIAGNOSIS — F1020 Alcohol dependence, uncomplicated: Secondary | ICD-10-CM | POA: Diagnosis not present

## 2020-06-13 DIAGNOSIS — F329 Major depressive disorder, single episode, unspecified: Secondary | ICD-10-CM | POA: Diagnosis not present

## 2020-06-13 DIAGNOSIS — F1020 Alcohol dependence, uncomplicated: Secondary | ICD-10-CM | POA: Diagnosis not present

## 2020-06-13 DIAGNOSIS — F419 Anxiety disorder, unspecified: Secondary | ICD-10-CM | POA: Diagnosis not present

## 2020-06-14 ENCOUNTER — Other Ambulatory Visit: Payer: Self-pay

## 2020-06-14 ENCOUNTER — Ambulatory Visit (INDEPENDENT_AMBULATORY_CARE_PROVIDER_SITE_OTHER): Payer: BC Managed Care – PPO | Admitting: Plastic Surgery

## 2020-06-14 DIAGNOSIS — L989 Disorder of the skin and subcutaneous tissue, unspecified: Secondary | ICD-10-CM | POA: Diagnosis not present

## 2020-06-14 DIAGNOSIS — I10 Essential (primary) hypertension: Secondary | ICD-10-CM

## 2020-06-14 DIAGNOSIS — G4733 Obstructive sleep apnea (adult) (pediatric): Secondary | ICD-10-CM | POA: Diagnosis not present

## 2020-06-14 NOTE — Progress Notes (Addendum)
[name], male    DOB: February 03, 1955, 65 y.o.   MRN: [id number]   Chief Complaint  Patient presents with   consult    The patient is a 65 year old male joining me by a telemetry visit.  His wife tested positive for COVID so out of abundance of caution we switched him to a telemetry visit.  He is requesting consultation about a changing skin lesion on his upper lid.  He says that it seems to be getting larger and affecting his visual field.  He has had 2 other consultations and decided that he did not want upper lid blepharoplasty only the lesion excisedn.  He has a little hypertension but otherwise is in good health.  I was able to see what he was talking about on the video.   Review of Systems  Constitutional: Negative.   HENT: Negative.    Eyes: Negative.   Respiratory: Negative.    Cardiovascular: Negative.   Gastrointestinal: Negative.   Endocrine: Negative.   Genitourinary: Negative.   Musculoskeletal: Negative.   Neurological: Negative.   Hematological: Negative.   Psychiatric/Behavioral: Negative.     Past Medical History:  Diagnosis Date   Alcohol use disorder, mild, abuse    Anemia    Anxiety    situational   Arthritis    Phreesia 02/21/2020   Asthma    Phreesia 02/21/2020   BPH (benign prostatic hyperplasia)    on flomax- Patien tand wife denies- and patient has never been on Flomax   Bundle branch block    per prior PCP records   CHF (congestive heart failure) ([place])    Chronic kidney disease    Acute Renal Failure- June 2021- admission- kidneys recovered quickly   Complication of anesthesia 03/11/2019   woke up before surgery was over- "paralytic"   COPD (chronic obstructive pulmonary disease) ([place])    Diverticulosis    by colonoscopy   Eczema    per prior PCP records   Elevated PSA 2015   peaked 6s, s/p benign biopsy 2014, sees urology [name]   Essential tremor    GERD (gastroesophageal reflux disease)    per prior  PCP records   History of panic attacks    per prior PCP records   HTN (hypertension)    Hypertension    Phreesia 02/21/2020   Mild intermittent asthma in adult without complication    Obesity, Class I, BMI 30-34.9    Osteoporosis    DEXA 06/2013 with osteopenia - h/o ?wrist/hip fracture from AVN from chronic steroid use (asthma), took reclast for 1 year   Pneumonia    10/08/19 >10 years ago   Seasonal allergies    Sleep apnea    Substance abuse ([place])    Phreesia 02/21/2020   Thoracic scoliosis childhood   Transaminitis    per prior PCP records   Vitamin B 12 deficiency     Past Surgical History:  Procedure Laterality Date   APPLICATION OF WOUND VAC Left 05/09/2018   Procedure: Application Of Wound Vac;  Surgeon: Newt Minion, MD;  Location: [place];  Service: Orthopedics;  Laterality: Left;   BIOPSY  04/30/2019   Procedure: BIOPSY;  Surgeon: Juanita Craver, MD;  [facility];  Service: Endoscopy;;   COLONOSCOPY  12/2013   polyps, int hem, diverticulosis, rpt 5 yrs [name])   COLONOSCOPY WITH PROPOFOL N/A 04/30/2019   Procedure: COLONOSCOPY WITH PROPOFOL;  Surgeon: Juanita Craver, MD;  [facility];  Service: Endoscopy;  Laterality: N/A;   ELBOW SURGERY Right 2008   golfer's elbow   ESOPHAGOGASTRODUODENOSCOPY (EGD) WITH PROPOFOL N/A 04/30/2019   Procedure: ESOPHAGOGASTRODUODENOSCOPY (EGD) WITH PROPOFOL;  Surgeon: Juanita Craver, MD;  [facility];  Service: Endoscopy;  Laterality: N/A;   HERNIA REPAIR N/A    Phreesia 02/21/2020   INCISIONAL HERNIA REPAIR N/A 10/15/2019   Procedure: HERNIA REPAIR INCISIONAL WITH MESH, TAR;  Surgeon: Ralene Ok, MD;  Location: [place];  Service: General;  Laterality: N/A;   INGUINAL HERNIA REPAIR Bilateral childhood & ~1995   LAPAROTOMY N/A 01/05/2019   Procedure: EXPLORATORY LAPAROTOMY graham patch repair;  Surgeon: Benjamine Sprague, DO;  Location: [facility];  Service: General;  Laterality: N/A;   LAPAROTOMY N/A 10/15/2019    Procedure: EXPLORATORY LAPAROTOMY;  Surgeon: Ralene Ok, MD;  Location: [place];  Service: General;  Laterality: N/A;   LYSIS OF ADHESION N/A 10/15/2019   Procedure: LYSIS OF ADHESION;  Surgeon: Ralene Ok, MD;  Location: [place];  Service: General;  Laterality: N/A;   NASAL SEPTUM SURGERY  2013   deviated septum   ORIF ANKLE FRACTURE Left 05/09/2018   Procedure: OPEN REDUCTION INTERNAL FIXATION LEFT LISFRANC FRACTURE/DISLOCATION;  Surgeon: Newt Minion, MD;  Location: [place];  Service: Orthopedics;  Laterality: Left;   POLYPECTOMY  04/30/2019   Procedure: POLYPECTOMY;  Surgeon: Juanita Craver, MD;  [facility];  Service: Endoscopy;;   TEE WITHOUT CARDIOVERSION N/A 06/29/2019   Procedure: TRANSESOPHAGEAL ECHOCARDIOGRAM (TEE);  Surgeon: Thayer Headings, MD;  [facility];  Service: Cardiovascular;  Laterality: N/A;   US ECHOCARDIOGRAPHY  02/2009   WNL, EF >55% Claiborne Billings)   US RENAL/AORTA Right 05/2009   1-59% diameter reduction renal artery, rec rpt 2 yrs   VASECTOMY N/A    Phreesia 02/21/2020      Current Outpatient Medications:    acetaminophen (TYLENOL) 500 MG tablet, Take 500 mg by mouth every 6 (six) hours as needed for mild pain., Disp: , Rfl:    amLODipine (NORVASC) 10 MG tablet, Take 1 tablet (10 mg total) by mouth daily., Disp: 90 tablet, Rfl: 2   diclofenac Sodium (VOLTAREN) 1 % GEL, Apply topically 4 (four) times daily., Disp: , Rfl:    folic acid (FOLVITE) 1 MG tablet, TAKE 1 TABLET BY MOUTH EVERY DAY, Disp: 90 tablet, Rfl: 0   gabapentin (NEURONTIN) 300 MG capsule, Take 1 capsule (300 mg total) by mouth 2 (two) times daily., Disp: 180 capsule, Rfl: 3   hydrochlorothiazide (HYDRODIURIL) 12.5 MG tablet, Take 1 tablet (12.5 mg total) by mouth daily., Disp: 90 tablet, Rfl: 1   losartan (COZAAR) 100 MG tablet, TAKE 1 TABLET(100 MG) BY MOUTH DAILY, Disp: 90 tablet, Rfl: 0   metoprolol succinate (TOPROL-XL) 50 MG 24 hr tablet, TAKE 1 TABLET(50 MG) BY MOUTH DAILY  WITH A MEAL, Disp: 90 tablet, Rfl: 0   Multiple Vitamin (MULTIVITAMIN WITH MINERALS) TABS tablet, Take 1 tablet by mouth daily., Disp: , Rfl:    naltrexone (DEPADE) 50 MG tablet, Take 1 tablet (50 mg total) by mouth daily., Disp: 90 tablet, Rfl: 2   omeprazole (PRILOSEC) 20 MG capsule, Take 20 mg by mouth daily., Disp: , Rfl:    traZODone (DESYREL) 50 MG tablet, Take 1 tablet (50 mg total) by mouth at bedtime., Disp: 30 tablet, Rfl: 1   Objective:   There were no vitals filed for this visit.  Physical Exam  Assessment & Plan:  Primary hypertension  OSA (obstructive sleep apnea)  Skin lesion of face  Recommend excision of skin lesion right upper lid.  We also talked about blepharoplasty and a visual field exam if he feels that his visual field is affected in the future.  We can do this in the office.  We will get pictures at that time.  I connected with  [name] on 06/14/20 by a video enabled telemedicine application and verified that I am speaking with the correct person using two identifiers.   I discussed the limitations of evaluation and management by telemedicine. The patient expressed understanding and agreed to proceed.  The patient was at home and I was at the office.  We spent 5 minutes in discussion and I spent an additional additional 10 minutes in his chart reviewing his past medical history and working on his note.  I will have someone from the office give him a call to arrange for the excision.  [place], DO

## 2020-06-15 DIAGNOSIS — G4733 Obstructive sleep apnea (adult) (pediatric): Secondary | ICD-10-CM | POA: Diagnosis not present

## 2020-06-15 DIAGNOSIS — J9601 Acute respiratory failure with hypoxia: Secondary | ICD-10-CM | POA: Diagnosis not present

## 2020-06-17 ENCOUNTER — Other Ambulatory Visit: Payer: Self-pay | Admitting: Cardiovascular Disease

## 2020-06-20 ENCOUNTER — Other Ambulatory Visit: Payer: Self-pay

## 2020-06-20 ENCOUNTER — Ambulatory Visit (INDEPENDENT_AMBULATORY_CARE_PROVIDER_SITE_OTHER): Payer: BC Managed Care – PPO | Admitting: Acute Care

## 2020-06-20 ENCOUNTER — Encounter: Payer: Self-pay | Admitting: Acute Care

## 2020-06-20 VITALS — BP 100/64 | HR 77 | Ht 71.0 in | Wt 194.2 lb

## 2020-06-20 DIAGNOSIS — G4733 Obstructive sleep apnea (adult) (pediatric): Secondary | ICD-10-CM | POA: Diagnosis not present

## 2020-06-20 DIAGNOSIS — F419 Anxiety disorder, unspecified: Secondary | ICD-10-CM | POA: Diagnosis not present

## 2020-06-20 DIAGNOSIS — R942 Abnormal results of pulmonary function studies: Secondary | ICD-10-CM

## 2020-06-20 DIAGNOSIS — F1020 Alcohol dependence, uncomplicated: Secondary | ICD-10-CM | POA: Diagnosis not present

## 2020-06-20 DIAGNOSIS — Z9989 Dependence on other enabling machines and devices: Secondary | ICD-10-CM | POA: Diagnosis not present

## 2020-06-20 DIAGNOSIS — J988 Other specified respiratory disorders: Secondary | ICD-10-CM | POA: Diagnosis not present

## 2020-06-20 DIAGNOSIS — F329 Major depressive disorder, single episode, unspecified: Secondary | ICD-10-CM | POA: Diagnosis not present

## 2020-06-20 MED ORDER — ALBUTEROL SULFATE HFA 108 (90 BASE) MCG/ACT IN AERS: 2.0000 | INHALATION_SPRAY | Freq: Four times a day (QID) | RESPIRATORY_TRACT | 3 refills | Status: DC | PRN

## 2020-06-20 NOTE — Patient Instructions (Addendum)
It is good to see you today. Great work on your CPAP compliance. AHI is 2.7 Continue on CPAP at bedtime. You appear to be benefiting from the treatment  Goal is to wear for at least 6 hours each night for maximal clinical benefit. Continue to work on weight loss, as the link between excess weight  and sleep apnea is well established.   Remember to establish a good bedtime routine, and work on sleep hygiene.  Limit daytime naps , avoid stimulants such as caffeine and nicotine close to bedtime, exercise daily to promote sleep quality, avoid heavy , spicy, fried , or rich foods before bed. Ensure adequate exposure to natural light during the day,establish a relaxing bedtime routine with a pleasant sleep environment ( Bedroom between 60 and 67 degrees, turn off bright lights , TV or device screens screens , consider black out curtains or white noise machines) Do not drive if sleepy. Remember to clean mask, tubing, filter, and reservoir once weekly with soapy water.  We will send in a rescue inhaler for use for any shortness of breath or wheezing while hiking at high altitudes.  Follow up with Judson Roch NP or [name] in 6 months  or before as needed.   Please contact office for sooner follow up if symptoms do not improve or worsen or seek emergency care  Have a fun trip

## 2020-06-20 NOTE — Progress Notes (Signed)
History of Present Illness Louis [name] is a 65 y.o. male with OSA on CPAP, and some mild pulmonary obstruction on PFT's. He is followed by [name].    06/20/2020 Pt. Presents for follow up of OSA on CPAP. He states he has minimal daytime sleepiness. No am headaches. He is wearing CPAP with 2 L oxygen bled in 15 cm H2O pressure. . Compliance down Load shows 30/30 nights. AHI is 2.7. Pt. Has not done a 3 night trial off oxygen. As [name] recommended at his last visit as the patient is not planning to take oxygen on his trip before his trip to [place] at the end of this week. He will do this this week. Patient and his wife are on the way to [place] for a 17 day trip. They will be in elevated altitudes. They are requesting a rescue inhaler in the event he needs it while hiking. As he does have obstruction on PFT's I will send in a prescription for the trip. Instructions for use have been reviewed. We also discussed that most locations with elevated altitudes do have vendors who sell small portable oxygen cannister. They will see if these are available where they are traveling. He does have a leak on his down load, but his AHI is 2.7, so well controlled despite the leak.    Down Load 05/18/2020-06/16/2020 Set Pressure of 15 cm H2O Bleeds in 2 L oxygen  Usage 30/30 days > 4 hours 30/30 days Average use 7 hours 24 minutes AHI 2.7   Test Results: June 26, 2019 TTE: LVEF 55 to 56%, RV systolic function mildly reduced, aortic dilation, valves normal as visualized June 28 TEE: LVEF normal, RV systolic function normal, mitral and aortic valves normal 6/24 SARS COV 2 > negative 6/24 blood > enterococcus faecalis amp sensitive 6/24 urine culture 6/26 blood > negative   His admission for enterococcal bacteremia -complicated by hypercarbic respiratory failure .   NPSG AHI 39/h, >> CPAP 9 cm + 2L o2   Split Night Study 08/2017>> DIAGNOSIS  - Obstructive Sleep Apnea (327.23 [G47.33 ICD-10])  -  Nocturnal Hypoxemia (327.26 [G47.36 ICD-10])  RECOMMENDATIONS  - Trial of CPAP therapy on 9 cm H2O with a Medium size  [employer] Full Face Mask Simplus mask and heated  humidification.  - 2L O2 should be blended into CPAP   PFT's 08/15/2017>> suggestive extraparenchymal restriction, He has moderate airway obstruction, FEV1 58%/1.87, F/F ratio of 64 pre BD  CBC Latest Ref Rng & Units 01/11/2020 10/16/2019 10/08/2019  WBC 3.4 - 10.8 x10E3/uL 3.9 9.6 7.4  Hemoglobin 13.0 - 17.7 g/dL 13.6 11.6(L) 12.0(L)  Hematocrit 37.5 - 51.0 % 40.3 35.3(L) 38.3(L)  Platelets 150 - 450 x10E3/uL 292 362 374    BMP Latest Ref Rng & Units 01/11/2020 10/08/2019 07/21/2019  Glucose 65 - 99 mg/dL 102(H) 96 96  BUN 8 - 27 mg/dL 15 20 12   Creatinine 0.76 - 1.27 mg/dL 0.87 0.85 0.88  BUN/Creat Ratio 10 - 24 17 - 14  Sodium 134 - 144 mmol/L 134 136 135  Potassium 3.5 - 5.2 mmol/L 4.9 4.0 4.4  Chloride 96 - 106 mmol/L 96 99 99  CO2 20 - 29 mmol/L 25 29 25   Calcium 8.6 - 10.2 mg/dL 9.5 9.8 9.2    BNP    Component Value Date/Time   BNP 229.1 (H) 06/25/2019 1217    ProBNP No results found for: PROBNP  PFT    Component Value Date/Time  FEV1PRE 1.87 08/15/2017 1002   FEV1POST 1.97 08/15/2017 1002   FVCPRE 2.90 08/15/2017 1002   FVCPOST 2.79 08/15/2017 1002   TLC 5.72 08/15/2017 1002   DLCOUNC 27.00 08/15/2017 1002   PREFEV1FVCRT 64 08/15/2017 1002   PSTFEV1FVCRT 71 08/15/2017 1002    No results found.   Past medical hx Past Medical History:  Diagnosis Date   Alcohol use disorder, mild, abuse    Anemia    Anxiety    situational   Arthritis    Phreesia 02/21/2020   Asthma    [employer]reesia 02/21/2020   BPH (benign prostatic hyperplasia)    on flomax- Patien tand wife denies- and patient has never been on Flomax   Bundle branch block    per prior PCP records   CHF (congestive heart failure) ([place])    Chronic kidney disease    Acute Renal Failure- June 2021- admission- kidneys recovered  quickly   Complication of anesthesia 03/11/2019   woke up before surgery was over- "paralytic"   COPD (chronic obstructive pulmonary disease) ([place])    Diverticulosis    by colonoscopy   Eczema    per prior PCP records   Elevated PSA 2015   peaked 6s, s/p benign biopsy 2014, sees urology [name]   Essential tremor    GERD (gastroesophageal reflux disease)    per prior PCP records   History of panic attacks    per prior PCP records   HTN (hypertension)    Hypertension    Phreesia 02/21/2020   Mild intermittent asthma in adult without complication    Obesity, Class I, BMI 30-34.9    Osteoporosis    DEXA 06/2013 with osteopenia - h/o ?wrist/hip fracture from AVN from chronic steroid use (asthma), took reclast for 1 year   Pneumonia    10/08/19 >10 years ago   Seasonal allergies    Sleep apnea    Substance abuse ([place])    Phreesia 02/21/2020   Thoracic scoliosis childhood   Transaminitis    per prior PCP records   Vitamin B 12 deficiency      Social History   Tobacco Use   Smoking status: Never   Smokeless tobacco: Never  Vaping Use   Vaping Use: Never used  Substance Use Topics   Alcohol use: Not Currently   Drug use: No    Louis [name] reports that he has never smoked. He has never used smokeless tobacco. He reports previous alcohol use. He reports that he does not use drugs.  Tobacco Cessation: Never smoker   Past surgical hx, Family hx, Social hx all reviewed.  Current Outpatient Medications on File Prior to Visit  Medication Sig   acetaminophen (TYLENOL) 500 MG tablet Take 500 mg by mouth every 6 (six) hours as needed for mild pain.   amLODipine (NORVASC) 10 MG tablet Take 1 tablet (10 mg total) by mouth daily. (Patient taking differently: Take 5 mg by mouth daily. Per patient takes 5 mg daily.)   diclofenac Sodium (VOLTAREN) 1 % GEL Apply topically 4 (four) times daily.   folic acid (FOLVITE) 1 MG tablet TAKE 1 TABLET BY MOUTH EVERY DAY   gabapentin  (NEURONTIN) 300 MG capsule Take 1 capsule (300 mg total) by mouth 2 (two) times daily.   hydrochlorothiazide (HYDRODIURIL) 12.5 MG tablet Take 1 tablet (12.5 mg total) by mouth daily.   losartan (COZAAR) 100 MG tablet TAKE 1 TABLET(100 MG) BY MOUTH DAILY   metoprolol succinate (TOPROL-XL) 50 MG 24 hr tablet TAKE  1 TABLET(50 MG) BY MOUTH DAILY WITH A MEAL   Multiple Vitamin (MULTIVITAMIN WITH MINERALS) TABS tablet Take 1 tablet by mouth daily.   naltrexone (DEPADE) 50 MG tablet Take 1 tablet (50 mg total) by mouth daily.   omeprazole (PRILOSEC) 20 MG capsule Take 20 mg by mouth daily.   traZODone (DESYREL) 50 MG tablet Take 1 tablet (50 mg total) by mouth at bedtime.   No current facility-administered medications on file prior to visit.     Allergies  Allergen Reactions   Accupril [Quinapril Hcl] Cough   Nsaids     Perforated gastric ulcer   Other     Review Of Systems:  Constitutional:   No  weight loss, night sweats,  Fevers, chills, fatigue, or  lassitude.  HEENT:   No headaches,  Difficulty swallowing,  Tooth/dental problems, or  Sore throat,                No sneezing, itching, ear ache, nasal congestion, post nasal drip,   CV:  No chest pain,  Orthopnea, PND, swelling in lower extremities, anasarca, dizziness, palpitations, syncope.   GI  No heartburn, indigestion, abdominal pain, nausea, vomiting, diarrhea, change in bowel habits, loss of appetite, bloody stools.   Resp: No shortness of breath with exertion or at rest.  No excess mucus, no productive cough,  No non-productive cough,  No coughing up of blood.  No change in color of mucus.  No wheezing.  No chest wall deformity  Skin: no rash or lesions.  GU: no dysuria, change in color of urine, no urgency or frequency.  No flank pain, no hematuria   MS:  No joint pain or swelling.  No decreased range of motion.  No back pain.  Psych:  No change in mood or affect. No depression or anxiety.  No memory loss.   Vital  Signs BP 100/64 (BP Location: Left Arm, Patient Position: Sitting, Cuff Size: Normal)   Pulse 77   Ht 5\' 11"  (1.803 m)   Wt 194 lb 3.2 oz (88.1 kg)   SpO2 95%   BMI 27.09 kg/m    Physical Exam:  General- No distress,  A&Ox3, pleasant ENT: No sinus tenderness, TM clear, pale nasal mucosa, no oral exudate,no post nasal drip, no LAN Cardiac: S1, S2, regular rate and rhythm, no murmur Chest: No wheeze/ rales/ dullness; no accessory muscle use, no nasal flaring, no sternal retractions Abd.: Soft Non-tender, ND, BS +, Body mass index is 27.09 kg/m. Ext: No clubbing cyanosis, edema Neuro:  normal strength, MAE x 4, A&O x 3 Skin: No rashes, warm and dry, intact Psych: normal mood and behavior   Assessment/Plan  OSA on CPAP Great Compliance  AHI of 2.7 Plan Great work on your CPAP compliance. AHI is 2.7 Continue on CPAP at bedtime. You appear to be benefiting from the treatment  Goal is to wear for at least 6 hours each night for maximal clinical benefit. Continue to work on weight loss, as the link between excess weight  and sleep apnea is well established.   Remember to establish a good bedtime routine, and work on sleep hygiene.  Limit daytime naps , avoid stimulants such as caffeine and nicotine close to bedtime, exercise daily to promote sleep quality, avoid heavy , spicy, fried , or rich foods before bed. Ensure adequate exposure to natural light during the day,establish a relaxing bedtime routine with a pleasant sleep environment ( Bedroom between 60 and 67 degrees, turn off bright  lights , TV or device screens screens , consider black out curtains or white noise machines) Do not drive if sleepy. Remember to clean mask, tubing, filter, and reservoir once weekly with soapy water.  We will send in a rescue inhaler for use for any shortness of breath or wheezing while hiking at high altitudes.  Follow up with Judson Roch NP or [name] in  6 months  or before as needed.     I spent 35  minutes dedicated to the care of this patient on the date of this encounter to include pre-visit review of records, face-to-face time with the patient discussing conditions above, post visit ordering of testing, clinical documentation with the electronic health record, making appropriate referrals as documented, and communicating necessary information to the patient's healthcare team.    Magdalen Spatz, NP 06/20/2020  12:12 PM

## 2020-06-28 ENCOUNTER — Other Ambulatory Visit: Payer: Self-pay | Admitting: Family Medicine

## 2020-07-04 DIAGNOSIS — R062 Wheezing: Secondary | ICD-10-CM | POA: Diagnosis not present

## 2020-07-04 DIAGNOSIS — J9601 Acute respiratory failure with hypoxia: Secondary | ICD-10-CM | POA: Diagnosis not present

## 2020-07-11 ENCOUNTER — Telehealth: Payer: Self-pay | Admitting: Pulmonary Disease

## 2020-07-11 DIAGNOSIS — F419 Anxiety disorder, unspecified: Secondary | ICD-10-CM | POA: Diagnosis not present

## 2020-07-11 DIAGNOSIS — F1020 Alcohol dependence, uncomplicated: Secondary | ICD-10-CM | POA: Diagnosis not present

## 2020-07-11 DIAGNOSIS — G4733 Obstructive sleep apnea (adult) (pediatric): Secondary | ICD-10-CM

## 2020-07-11 DIAGNOSIS — F329 Major depressive disorder, single episode, unspecified: Secondary | ICD-10-CM | POA: Diagnosis not present

## 2020-07-11 NOTE — Telephone Encounter (Signed)
LMTCB. LM Informing patient I would contact MD regarding for cpap however it is contingent upon his insurance willing to give/approve him for another cpap machine if it has been less than the 5 year period.   Will route message to MD to get approval before sending new cpap order.   RA please advise if okay to send new order for cpap machine. Thanks :)

## 2020-07-11 NOTE — Telephone Encounter (Signed)
Pt stated that while traveling he left his CPAP machine and supplies in [place] and he has contacted [facility] and they informed him to contact [name] so that he can get a new order placed and he was wanting to know if there was anything he can do in the meantime while he is without it. Pls regard; [phone].

## 2020-07-12 NOTE — Telephone Encounter (Signed)
Multiple messages regarding this. Please see email.   Nothing further needed at this time.

## 2020-07-12 NOTE — Telephone Encounter (Signed)
Pt is returning phone call. Pls regard; [phone]

## 2020-07-14 ENCOUNTER — Encounter: Payer: Self-pay | Admitting: Family Medicine

## 2020-07-14 ENCOUNTER — Other Ambulatory Visit: Payer: Self-pay

## 2020-07-14 ENCOUNTER — Ambulatory Visit (INDEPENDENT_AMBULATORY_CARE_PROVIDER_SITE_OTHER): Payer: BC Managed Care – PPO | Admitting: Family Medicine

## 2020-07-14 ENCOUNTER — Other Ambulatory Visit (INDEPENDENT_AMBULATORY_CARE_PROVIDER_SITE_OTHER): Payer: BC Managed Care – PPO

## 2020-07-14 VITALS — BP 134/72 | HR 62 | Temp 98.2°F | Resp 16 | Ht 71.0 in | Wt 195.2 lb

## 2020-07-14 DIAGNOSIS — R739 Hyperglycemia, unspecified: Secondary | ICD-10-CM

## 2020-07-14 DIAGNOSIS — F1011 Alcohol abuse, in remission: Secondary | ICD-10-CM | POA: Diagnosis not present

## 2020-07-14 DIAGNOSIS — G4700 Insomnia, unspecified: Secondary | ICD-10-CM | POA: Diagnosis not present

## 2020-07-14 DIAGNOSIS — I10 Essential (primary) hypertension: Secondary | ICD-10-CM

## 2020-07-14 DIAGNOSIS — Z23 Encounter for immunization: Secondary | ICD-10-CM | POA: Diagnosis not present

## 2020-07-14 DIAGNOSIS — R600 Localized edema: Secondary | ICD-10-CM

## 2020-07-14 LAB — COMPREHENSIVE METABOLIC PANEL
ALT: 14 U/L (ref 0–53)
AST: 16 U/L (ref 0–37)
Albumin: 4.6 g/dL (ref 3.5–5.2)
Alkaline Phosphatase: 69 U/L (ref 39–117)
BUN: 17 mg/dL (ref 6–23)
CO2: 30 mEq/L (ref 19–32)
Calcium: 9.7 mg/dL (ref 8.4–10.5)
Chloride: 101 mEq/L (ref 96–112)
Creatinine, Ser: 0.87 mg/dL (ref 0.40–1.50)
GFR: 92.13 mL/min (ref >60.00)
Glucose, Bld: 98 mg/dL (ref 70–99)
Potassium: 4.1 mEq/L (ref 3.5–5.1)
Sodium: 138 mEq/L (ref 135–145)
Total Bilirubin: 1.2 mg/dL (ref 0.2–1.2)
Total Protein: 7.4 g/dL (ref 6.0–8.3)

## 2020-07-14 LAB — HEMOGLOBIN A1C: Hgb A1c MFr Bld: 5.4 % (ref 4.6–6.5)

## 2020-07-14 MED ORDER — NALTREXONE HCL 50 MG PO TABS: 50.0000 mg | ORAL_TABLET | Freq: Every day | ORAL | 2 refills | Status: DC

## 2020-07-14 MED ORDER — TRAZODONE HCL 50 MG PO TABS: 50.0000 mg | ORAL_TABLET | Freq: Every day | ORAL | 1 refills | Status: DC

## 2020-07-14 MED ORDER — AMLODIPINE BESYLATE 5 MG PO TABS: 5.0000 mg | ORAL_TABLET | Freq: Every day | ORAL | 2 refills | Status: DC

## 2020-07-14 MED ORDER — HYDROCHLOROTHIAZIDE 12.5 MG PO TABS: 12.5000 mg | ORAL_TABLET | Freq: Every day | ORAL | 2 refills | Status: DC

## 2020-07-14 MED ORDER — METOPROLOL SUCCINATE ER 50 MG PO TB24: ORAL_TABLET | ORAL | 2 refills | Status: DC

## 2020-07-14 NOTE — Addendum Note (Signed)
Addended by: Octavio Manns E on: 07/14/2020 09:13 AM   Modules accepted: Orders

## 2020-07-14 NOTE — Patient Instructions (Addendum)
See information below on swelling.  Sometimes that can be due to sodium in the diet, but amlodipine can also contribute.  As long as it improves overnight and not worsening I do not think we need any further testing at this time.  If it worsens or more persistent, follow-up and we can discuss other testing/causes.  No change in medications at this time.  Second and final dose of shingles vaccine given today.  Keep up the good work with exercise, watching diet.  Recheck in 6 months for physical but let me know if there are questions sooner.  Thanks for coming in today.  Edema  Edema is an abnormal buildup of fluids in the body tissues and under the skin. Swelling of the legs, feet, and ankles is a common symptom that becomes more likely as you get older. Swelling is also common in looser tissues, like around the eyes. When the affected area is squeezed, the fluid may move out of thatspot and leave a dent for a few moments. This dent is called pitting edema. There are many possible causes of edema. Eating too much salt (sodium) and being on your feet or sitting for a long time can cause edema in your legs, feet, and ankles. Hot weather may make edema worse. Common causes of edema include: Heart failure. Liver or kidney disease. Weak leg blood vessels. Cancer. An injury. Pregnancy. Medicines. Being obese. Low protein levels in the blood. Edema is usually painless. Your skin may look swollen or shiny. Follow these instructions at home: Keep the affected body part raised (elevated) above the level of your heart when you are sitting or lying down. Do not sit still or stand for long periods of time. Do not wear tight clothing. Do not wear garters on your upper legs. Exercise your legs to get your circulation going. This helps to move the fluid back into your blood vessels, and it may help the swelling go down. Wear elastic bandages or support stockings to reduce swelling as told by your health care  provider. Eat a low-salt (low-sodium) diet to reduce fluid as told by your health care provider. Depending on the cause of your swelling, you may need to limit how much fluid you drink (fluid restriction). Take over-the-counter and prescription medicines only as told by your health care provider. Contact a health care provider if: Your edema does not get better with treatment. You have heart, liver, or kidney disease and have symptoms of edema. You have sudden and unexplained weight gain. Get help right away if: You develop shortness of breath or chest pain. You cannot breathe when you lie down. You develop pain, redness, or warmth in the swollen areas. You have heart, liver, or kidney disease and suddenly get edema. You have a fever and your symptoms suddenly get worse. Summary Edema is an abnormal buildup of fluids in the body tissues and under the skin. Eating too much salt (sodium) and being on your feet or sitting for a long time can cause edema in your legs, feet, and ankles. Keep the affected body part raised (elevated) above the level of your heart when you are sitting or lying down. This information is not intended to replace advice given to you by your health care provider. Make sure you discuss any questions you have with your healthcare provider. Document Revised: 10/27/2019 Document Reviewed: 10/13/2019 Elsevier Patient Education  2022 [employer].

## 2020-07-14 NOTE — Progress Notes (Signed)
Subjective:  [name], male    DOB: [birth year]  Age: 65 y.o. MRN: [id number]  CC:  Chief Complaint  Patient presents with   Hypertension    Pt denies physical sxs, reports some swelling in his ankles in the evenings the since last visit, notes no pain or other concern. Needs refill amlodipine    Insomnia    Pt reports he has not needed the trazadone as he has managed without having to take this    Immunizations    Pt due for second shingles vaccine willing to do this dose today    Peripheral Neuropathy    Pt is in need of refill gabapentin, pt reports doing well.     HPI Louis [name] presents for   Hypertension: Treated with amlodipine 5 mg daily, losartan 100 mg daily, Toprol-XL 50 mg daily, hydrochlorothiazide 12.5 mg daily.  Some swelling in his ankles at times, notes at the end of the night, sock line, no chest pain/dyspnea.   Lipids normal in January, borderline glucose of 102 in January.  Overweight with BMI of 27.   History of obstructive sleep apnea, treated by [name], on CPAP.  No new side effects of medications. Home readings: Usually around 130/70 or lower. Exercises with Pilates, walking, free weights BP Readings from Last 3 Encounters:  07/14/20 134/72  06/20/20 100/64  02/24/20 124/76   Lab Results  Component Value Date   CREATININE 0.87 01/11/2020   Wt Readings from Last 3 Encounters:  07/14/20 195 lb 3.2 oz (88.5 kg)  06/20/20 194 lb 3.2 oz (88.1 kg)  02/24/20 200 lb (90.7 kg)    Insomnia Discussed in February, trazodone 50 mg as needed at that time.  Has been doing well off medications.  Seemed to be more of an issue at [facility]  Peripheral neuropathy Treated by neurology previously, takes gabapentin.  300 mg twice daily.  Current dose is stable for his foot symptoms.  Alcohol abuse in remission In recovery, treated at [facility] with 28-day program, discharged in February.  Followed by outpatient intensive  therapy.  Sober since January 18.  Takes naltrexone 50 mg daily, plan for remaining on this for a full year. Still abstaining from alcohol.  Health maintenance  second Shingrix given today   History Patient Active Problem List   Diagnosis Date Noted   Skin lesion of face 06/14/2020   Unilateral primary osteoarthritis, right knee 01/06/2020   S/P hernia repair 10/15/2019   Neuropathy 07/20/2019   Macrocytic anemia    Debility 07/07/2019   Atelectasis    Enterococcal bacteremia 06/26/2019   Perforated viscus 01/06/2019   Onychomycosis 05/19/2018   Foot fracture, left, closed, initial encounter 05/09/2018   Lisfranc dislocation, left, initial encounter    Primary pulmonary hypertension ([place]) 78/29/5621   Diastolic dysfunction 30/86/5784   OSA (obstructive sleep apnea) 07/08/2017   SOB (shortness of breath) 07/02/2017   Chronic respiratory failure with hypoxia and hypercapnia ([place]) 07/02/2017   Cardiomegaly 07/02/2017   Exposure keratitis 02/17/2017   Pedal edema 12/18/2016   Transaminitis 12/18/2016   Bilateral pes planus 12/18/2016   Psoriasis-eczema overlap condition 07/30/2016   High serum high density lipoprotein (HDL) 09/26/2015   Epistaxis 09/26/2015   Renal artery stenosis in 1 of 2 vessels (HCC)    Essential tremor    Elevated PSA    GERD (gastroesophageal reflux disease)    Health maintenance examination 10/08/2014   Anxiety state 10/08/2014   Obesity, Class  I, BMI 30-34.9    Mild intermittent asthma in adult without complication    Seasonal allergies    HTN (hypertension)    Scoliosis    Osteoporosis    Past Medical History:  Diagnosis Date   Alcohol use disorder, mild, abuse    Anemia    Anxiety    situational   Arthritis    Phreesia 02/21/2020   Asthma    Phreesia 02/21/2020   BPH (benign prostatic hyperplasia)    on flomax- Patien tand wife denies- and patient has never been on Flomax   Bundle branch block    per prior PCP records   CHF  (congestive heart failure) ([place])    Chronic kidney disease    Acute Renal Failure- June 2021- admission- kidneys recovered quickly   Complication of anesthesia 03/11/2019   woke up before surgery was over- "paralytic"   COPD (chronic obstructive pulmonary disease) ([place])    Diverticulosis    by colonoscopy   Eczema    per prior PCP records   Elevated PSA 2015   peaked 6s, s/p benign biopsy 2014, sees urology [name]   Essential tremor    GERD (gastroesophageal reflux disease)    per prior PCP records   History of panic attacks    per prior PCP records   HTN (hypertension)    Hypertension    Phreesia 02/21/2020   Mild intermittent asthma in adult without complication    Obesity, Class I, BMI 30-34.9    Osteoporosis    DEXA 06/2013 with osteopenia - h/o ?wrist/hip fracture from AVN from chronic steroid use (asthma), took reclast for 1 year   Pneumonia    10/08/19 >10 years ago   Seasonal allergies    Sleep apnea    Substance abuse ([place])    Phreesia 02/21/2020   Thoracic scoliosis childhood   Transaminitis    per prior PCP records   Vitamin B 12 deficiency    Past Surgical History:  Procedure Laterality Date   APPLICATION OF WOUND VAC Left 05/09/2018   Procedure: Application Of Wound Vac;  Surgeon: Newt Minion, MD;  Location: [place];  Service: Orthopedics;  Laterality: Left;   BIOPSY  04/30/2019   Procedure: BIOPSY;  Surgeon: Juanita Craver, MD;  Location: [facility];  Service: Endoscopy;;   COLONOSCOPY  12/2013   polyps, int hem, diverticulosis, rpt 5 yrs [name])   COLONOSCOPY WITH PROPOFOL N/A 04/30/2019   Procedure: COLONOSCOPY WITH PROPOFOL;  Surgeon: Juanita Craver, MD;  Location: [facility];  Service: Endoscopy;  Laterality: N/A;   ELBOW SURGERY Right 2008   golfer's elbow   ESOPHAGOGASTRODUODENOSCOPY (EGD) WITH PROPOFOL N/A 04/30/2019   Procedure: ESOPHAGOGASTRODUODENOSCOPY (EGD) WITH PROPOFOL;  Surgeon: Juanita Craver, MD;  Location: [facility];  Service:  Endoscopy;  Laterality: N/A;   HERNIA REPAIR N/A    Phreesia 02/21/2020   INCISIONAL HERNIA REPAIR N/A 10/15/2019   Procedure: HERNIA REPAIR INCISIONAL WITH MESH, TAR;  Surgeon: Ralene Ok, MD;  Location: [place];  Service: General;  Laterality: N/A;   INGUINAL HERNIA REPAIR Bilateral childhood & ~1995   LAPAROTOMY N/A 01/05/2019   Procedure: EXPLORATORY LAPAROTOMY graham patch repair;  Surgeon: Benjamine Sprague, DO;  Location: [facility];  Service: General;  Laterality: N/A;   LAPAROTOMY N/A 10/15/2019   Procedure: EXPLORATORY LAPAROTOMY;  Surgeon: Ralene Ok, MD;  Location: [place];  Service: General;  Laterality: N/A;   LYSIS OF ADHESION N/A 10/15/2019   Procedure: LYSIS OF ADHESION;  Surgeon: Ralene Ok, MD;  Location: [place];  Service: General;  Laterality: N/A;   NASAL SEPTUM SURGERY  2013   deviated septum   ORIF ANKLE FRACTURE Left 05/09/2018   Procedure: OPEN REDUCTION INTERNAL FIXATION LEFT LISFRANC FRACTURE/DISLOCATION;  Surgeon: Newt Minion, MD;  Location: [employer];  Service: Orthopedics;  Laterality: Left;   POLYPECTOMY  04/30/2019   Procedure: POLYPECTOMY;  Surgeon: Juanita Craver, MD;  Location: [facility];  Service: Endoscopy;;   TEE WITHOUT CARDIOVERSION N/A 06/29/2019   Procedure: TRANSESOPHAGEAL ECHOCARDIOGRAM (TEE);  Surgeon: Acie Fredrickson Wonda Cheng, MD;  [facility];  Service: Cardiovascular;  Laterality: N/A;   US ECHOCARDIOGRAPHY  02/2009   WNL, EF >55% Claiborne Billings)   US RENAL/AORTA Right 05/2009   1-59% diameter reduction renal artery, rec rpt 2 yrs   VASECTOMY N/A    Phreesia 02/21/2020   Allergies  Allergen Reactions   Accupril [Quinapril Hcl] Cough   Nsaids     Perforated gastric ulcer   Other    Prior to Admission medications   Medication Sig Start Date End Date Taking? Authorizing Provider  acetaminophen (TYLENOL) 500 MG tablet Take 500 mg by mouth every 6 (six) hours as needed for mild pain.   Yes [provider]  albuterol (VENTOLIN HFA) 108  (90 Base) MCG/ACT inhaler Inhale 2 puffs into the lungs every 6 (six) hours as needed for wheezing or shortness of breath. 06/20/20  Yes Magdalen Spatz, NP  amLODipine (NORVASC) 10 MG tablet Take 1 tablet (10 mg total) by mouth daily. Patient taking differently: Take 5 mg by mouth daily. Per patient takes 5 mg daily. 02/24/20  Yes Wendie Agreste, MD  diclofenac Sodium (VOLTAREN) 1 % GEL Apply topically 4 (four) times daily.   Yes [provider]  gabapentin (NEURONTIN) 300 MG capsule Take 1 capsule (300 mg total) by mouth 2 (two) times daily. 10/26/19  Yes Kathrynn Ducking, MD  hydrochlorothiazide (HYDRODIURIL) 12.5 MG tablet Take 1 tablet (12.5 mg total) by mouth daily. 04/06/20  Yes Wendie Agreste, MD  losartan (COZAAR) 100 MG tablet TAKE 1 TABLET(100 MG) BY MOUTH DAILY 06/17/20  Yes Troy Sine, MD  metoprolol succinate (TOPROL-XL) 50 MG 24 hr tablet TAKE 1 TABLET(50 MG) BY MOUTH DAILY WITH A MEAL 05/31/20  Yes Wendie Agreste, MD  Multiple Vitamin (MULTIVITAMIN WITH MINERALS) TABS tablet Take 1 tablet by mouth daily. 07/08/19  Yes Dahal, Marlowe Aschoff, MD  naltrexone (DEPADE) 50 MG tablet Take 1 tablet (50 mg total) by mouth daily. 02/24/20  Yes Wendie Agreste, MD  omeprazole (PRILOSEC) 20 MG capsule Take 20 mg by mouth daily.   Yes [provider]  traZODone (DESYREL) 50 MG tablet Take 1 tablet (50 mg total) by mouth at bedtime. Patient not taking: Reported on 07/14/2020 02/24/20   Wendie Agreste, MD   Social History   Socioeconomic History   Marital status: Married    Spouse name: Not on file   Number of children: Not on file   Years of education: Not on file   Highest education level: Not on file  Occupational History   Not on file  Tobacco Use   Smoking status: Never   Smokeless tobacco: Never  Vaping Use   Vaping Use: Never used  Substance and Sexual Activity   Alcohol use: Not Currently   Drug use: No   Sexual activity: Yes    Birth control/protection:  None  Other Topics Concern   Not on file  Social History Narrative  Lives with wife (retired Materials engineer), 64yo dog died April 14, 2014   Grown children   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  Eyes:  Negative for visual disturbance.  Respiratory:  Negative for cough, chest tightness and shortness of breath.   Cardiovascular:  Negative for chest pain, palpitations and leg swelling.  Gastrointestinal:  Negative for abdominal pain and blood in stool.  Neurological:  Negative for dizziness, light-headedness and headaches.    Objective:   Vitals:   07/14/20 0802  BP: 134/72  Pulse: 62  Resp: 16  Temp: 98.2 F (36.8 C)  TempSrc: Temporal  SpO2: 96%  Weight: 195 lb 3.2 oz (88.5 kg)  Height: 5\' 11"  (1.803 m)     Physical Exam Vitals reviewed.  Constitutional:      Appearance: He is well-developed.  HENT:     Head: Normocephalic and atraumatic.  Neck:     Vascular: No carotid bruit or JVD.  Cardiovascular:     Rate and Rhythm: Normal rate and regular rhythm.     Heart sounds: Normal heart sounds. No murmur heard. Pulmonary:     Effort: Pulmonary effort is normal.     Breath sounds: Normal breath sounds. No rales.  Musculoskeletal:     Right lower leg: No edema.     Left lower leg: No edema.     Comments: No appreciable edema noted at ankles/lower extremities.  Skin:    General: Skin is warm and dry.  Neurological:     Mental Status: He is alert and oriented to person, place, and time.  Psychiatric:         Mood and Affect: Mood normal.       Assessment & Plan:  Shakim Faith is a 65 y.o. male . Essential hypertension - Plan: amLODipine (NORVASC) 5 MG tablet, hydrochlorothiazide (HYDRODIURIL) 12.5 MG tablet, metoprolol succinate (TOPROL-XL) 50 MG 24 hr tablet, Comprehensive metabolic panel  - Stable, tolerating current regimen. Medications refilled. Labs pending as above.  Change prescription to 5 mg amlodipine which he has been taking at home.  No changes.  Continue CPAP for OSA.  Alcohol abuse, in remission - Plan: naltrexone (DEPADE) 50 MG tablet  -Stable, has remained abstinent.  Continue naltrexone.  Insomnia, unspecified type - Plan: traZODone (DESYREL) 50 MG tablet  -Doing well.  Using CPAP as above, trazodone refilled if needed but has not required recently.  Need for shingles vaccine - Plan: Varicella-zoster vaccine IM given  Pedal edema - Plan: Comprehensive metabolic panel  -Noted ankles at the end of the day, could be related to 8, amlodipine, potentially sodium in the diet.  Improves overnight.  No apparent symptoms for CHF.  Discussed watching sodium in diet, foot elevation, compression stockings as needed with RTC precautions.  Handout given  Hyperglycemia - Plan: Comprehensive metabolic panel, Hemoglobin A1c  -Borderline on previous CMP but not fasting.  Check A1c  Meds ordered this encounter  Medications   amLODipine (NORVASC) 5 MG tablet  Sig: Take 1 tablet (5 mg total) by mouth daily.    Dispense:  90 tablet    Refill:  2   hydrochlorothiazide (HYDRODIURIL) 12.5 MG tablet    Sig: Take 1 tablet (12.5 mg total) by mouth daily.    Dispense:  90 tablet    Refill:  2   metoprolol succinate (TOPROL-XL) 50 MG 24 hr tablet    Sig: TAKE 1 TABLET(50 MG) BY MOUTH DAILY WITH A MEAL    Dispense:  90 tablet    Refill:  2    **Patient requests 90 days supply**   naltrexone (DEPADE) 50 MG tablet    Sig: Take 1 tablet (50 mg total) by mouth daily.    Dispense:  90  tablet    Refill:  2   traZODone (DESYREL) 50 MG tablet    Sig: Take 1 tablet (50 mg total) by mouth at bedtime.    Dispense:  30 tablet    Refill:  1    Okay to place on hold if needed   Patient Instructions  See information below on swelling.  Sometimes that can be due to sodium in the diet, but amlodipine can also contribute.  As long as it improves overnight and not worsening I do not think we need any further testing at this time.  If it worsens or more persistent, follow-up and we can discuss other testing/causes.  No change in medications at this time.  Second and final dose of shingles vaccine given today.  Keep up the good work with exercise, watching diet.  Recheck in 6 months for physical but let me know if there are questions sooner.  Thanks for coming in today.  Edema  Edema is an abnormal buildup of fluids in the body tissues and under the skin. Swelling of the legs, feet, and ankles is a common symptom that becomes more likely as you get older. Swelling is also common in looser tissues, like around the eyes. When the affected area is squeezed, the fluid may move out of thatspot and leave a dent for a few moments. This dent is called pitting edema. There are many possible causes of edema. Eating too much salt (sodium) and being on your feet or sitting for a long time can cause edema in your legs, feet, and ankles. Hot weather may make edema worse. Common causes of edema include: Heart failure. Liver or kidney disease. Weak leg blood vessels. Cancer. An injury. Pregnancy. Medicines. Being obese. Low protein levels in the blood. Edema is usually painless. Your skin may look swollen or shiny. Follow these instructions at home: Keep the affected body part raised (elevated) above the level of your heart when you are sitting or lying down. Do not sit still or stand for long periods of time. Do not wear tight clothing. Do not wear garters on your upper legs. Exercise your legs  to get your circulation going. This helps to move the fluid back into your blood vessels, and it may help the swelling go down. Wear elastic bandages or support stockings to reduce swelling as told by your health care provider. Eat a low-salt (low-sodium) diet to reduce fluid as told by your health care provider. Depending on the cause of your swelling, you may need to limit how much fluid you drink (fluid restriction). Take over-the-counter and prescription medicines only as told by your health care provider. Contact a health care provider if: Your edema does not get better with treatment. You have heart,  liver, or kidney disease and have symptoms of edema. You have sudden and unexplained weight gain. Get help right away if: You develop shortness of breath or chest pain. You cannot breathe when you lie down. You develop pain, redness, or warmth in the swollen areas. You have heart, liver, or kidney disease and suddenly get edema. You have a fever and your symptoms suddenly get worse. Summary Edema is an abnormal buildup of fluids in the body tissues and under the skin. Eating too much salt (sodium) and being on your feet or sitting for a long time can cause edema in your legs, feet, and ankles. Keep the affected body part raised (elevated) above the level of your heart when you are sitting or lying down. This information is not intended to replace advice given to you by your health care provider. Make sure you discuss any questions you have with your healthcare provider. Document Revised: 10/27/2019 Document Reviewed: 10/13/2019 Elsevier Patient Education  2022 [place],   Merri Ray, MD [place], [facility] 07/14/20 8:41 AM

## 2020-07-18 DIAGNOSIS — F329 Major depressive disorder, single episode, unspecified: Secondary | ICD-10-CM | POA: Diagnosis not present

## 2020-07-18 DIAGNOSIS — F419 Anxiety disorder, unspecified: Secondary | ICD-10-CM | POA: Diagnosis not present

## 2020-08-01 DIAGNOSIS — F419 Anxiety disorder, unspecified: Secondary | ICD-10-CM | POA: Diagnosis not present

## 2020-08-01 DIAGNOSIS — F329 Major depressive disorder, single episode, unspecified: Secondary | ICD-10-CM | POA: Diagnosis not present

## 2020-08-04 DIAGNOSIS — J9601 Acute respiratory failure with hypoxia: Secondary | ICD-10-CM | POA: Diagnosis not present

## 2020-08-08 DIAGNOSIS — F329 Major depressive disorder, single episode, unspecified: Secondary | ICD-10-CM | POA: Diagnosis not present

## 2020-08-08 DIAGNOSIS — G4700 Insomnia, unspecified: Secondary | ICD-10-CM | POA: Diagnosis not present

## 2020-08-08 DIAGNOSIS — F1020 Alcohol dependence, uncomplicated: Secondary | ICD-10-CM | POA: Diagnosis not present

## 2020-08-08 DIAGNOSIS — F419 Anxiety disorder, unspecified: Secondary | ICD-10-CM | POA: Diagnosis not present

## 2020-08-16 ENCOUNTER — Other Ambulatory Visit: Payer: Self-pay | Admitting: Cardiovascular Disease

## 2020-08-22 DIAGNOSIS — F329 Major depressive disorder, single episode, unspecified: Secondary | ICD-10-CM | POA: Diagnosis not present

## 2020-08-22 DIAGNOSIS — F419 Anxiety disorder, unspecified: Secondary | ICD-10-CM | POA: Diagnosis not present

## 2020-08-22 DIAGNOSIS — F1020 Alcohol dependence, uncomplicated: Secondary | ICD-10-CM | POA: Diagnosis not present

## 2020-08-29 DIAGNOSIS — F1020 Alcohol dependence, uncomplicated: Secondary | ICD-10-CM | POA: Diagnosis not present

## 2020-08-29 DIAGNOSIS — F329 Major depressive disorder, single episode, unspecified: Secondary | ICD-10-CM | POA: Diagnosis not present

## 2020-09-04 DIAGNOSIS — J9601 Acute respiratory failure with hypoxia: Secondary | ICD-10-CM | POA: Diagnosis not present

## 2020-09-12 ENCOUNTER — Ambulatory Visit: Payer: Self-pay

## 2020-09-12 ENCOUNTER — Other Ambulatory Visit: Payer: Self-pay

## 2020-09-12 ENCOUNTER — Ambulatory Visit (INDEPENDENT_AMBULATORY_CARE_PROVIDER_SITE_OTHER): Payer: BC Managed Care – PPO | Admitting: Orthopedic Surgery

## 2020-09-12 DIAGNOSIS — M21612 Bunion of left foot: Secondary | ICD-10-CM | POA: Diagnosis not present

## 2020-09-12 DIAGNOSIS — M205X2 Other deformities of toe(s) (acquired), left foot: Secondary | ICD-10-CM

## 2020-09-12 DIAGNOSIS — M79672 Pain in left foot: Secondary | ICD-10-CM

## 2020-09-13 ENCOUNTER — Encounter: Payer: Self-pay | Admitting: Orthopedic Surgery

## 2020-09-13 NOTE — Progress Notes (Signed)
Office Visit Note   Patient: Louis [name]           Date of Birth: 04-05-1955           MRN: [id number] Visit Date: 09/12/2020              Requested by: Louis Agreste, MD [address] [place],  [place] [address] PCP: Louis Agreste, MD  Chief Complaint  Patient presents with   Left Foot - Pain    HX 05/2018 ORIF lisfranc fx dislocation       HPI: Patient is a 65 year old gentleman who presents complaining of overlapping of the great toe and second toe patient is concerned about developing ulcers and progression of the deformity.  Patient is status post open reduction internal fixation of a Lisfranc fracture dislocation in May 2020.  Patient states the great toe was moved on top of the second toe and the second toe has moved medially under the great toe.  Patient complains of pain with ambulation.  Assessment & Plan: Visit Diagnoses:  1. Pain in left foot   2. Bunion of great toe of left foot   3. Claw toe, acquired, left     Plan: Discussed with the patient with the clawing of the second toe on the long second metatarsal with overlapping of the great toe and second toe the recommended treatment would be to proceed with an Aiken osteotomy of the proximal phalanx of the great toe and a Weil osteotomy for the second metatarsal.  Possible PIP resection.  Risks and benefits were discussed including recurrent deformity.  Patient states he understands wished to proceed at this time after his son's wedding on October 29.  Follow-Up Instructions: Return in about 2 weeks (around 09/26/2020) for Follow-up 1 to 2 weeks after surgery.Louis [name] Exam  Patient is alert, oriented, no adenopathy, well-dressed, normal affect, normal respiratory effort. Examination patient has a good dorsalis pedis pulse he has a great toe that is overlapping the second toe and the second toe with claw toe deformity beneath the great toe.  Imaging: No results found. No images are attached to the  encounter.  Labs: Lab Results  Component Value Date   HGBA1C 5.4 07/14/2020   HGBA1C 5.7 (H) 06/26/2019   REPTSTATUS 07/04/2019 FINAL 07/01/2019   GRAMSTAIN NO WBC SEEN NO ORGANISMS SEEN  07/01/2019   CULT  07/01/2019    Consistent with normal respiratory flora. Performed at [facility], [place] [address] [place], [place] [address]    LABORGA ENTEROCOCCUS FAECALIS 06/25/2019     Lab Results  Component Value Date   ALBUMIN 4.6 07/14/2020   ALBUMIN 4.2 01/11/2020   ALBUMIN 3.7 10/08/2019   PREALBUMIN 5.5 (L) 06/30/2019    Lab Results  Component Value Date   MG 1.9 07/21/2019   MG 1.8 07/14/2019   MG 1.6 (L) 07/13/2019   Lab Results  Component Value Date   VD25OH 35.12 09/26/2015   VD25OH 67.1 10/11/2014   VD25OH 67.1 06/16/2013    Lab Results  Component Value Date   PREALBUMIN 5.5 (L) 06/30/2019   CBC EXTENDED Latest Ref Rng & Units 01/11/2020 10/16/2019 10/08/2019  WBC 3.4 - 10.8 x10E3/uL 3.9 9.6 7.4  RBC 4.14 - 5.80 x10E6/uL 4.45 3.86(L) 4.18(L)  HGB 13.0 - 17.7 g/dL 13.6 11.6(L) 12.0(L)  HCT 37.5 - 51.0 % 40.3 35.3(L) 38.3(L)  PLT 150 - 450 x10E3/uL 292 362 374  NEUTROABS 1.4 - 7.0  x10E3/uL 1.7 - -  LYMPHSABS 0.7 - 3.1 x10E3/uL 1.4 - -     There is no height or weight on file to calculate BMI.  Orders:  Orders Placed This Encounter  Procedures   XR Foot Complete Left   No orders of the defined types were placed in this encounter.    Procedures: No procedures performed  Clinical Data: No additional findings.  ROS:  All other systems negative, except as noted in the HPI. Review of Systems  Objective: Vital Signs: There were no vitals taken for this visit.  Specialty Comments:  No specialty comments available.  PMFS History: Patient Active Problem List   Diagnosis Date Noted   Skin lesion of face 06/14/2020   Unilateral primary osteoarthritis, right knee 01/06/2020   S/P hernia repair 10/15/2019   Neuropathy 07/20/2019    Macrocytic anemia    Debility 07/07/2019   Atelectasis    Enterococcal bacteremia 06/26/2019   Perforated viscus 01/06/2019   Onychomycosis 05/19/2018   Foot fracture, left, closed, initial encounter 05/09/2018   Lisfranc dislocation, left, initial encounter    Primary pulmonary hypertension ([place]) [id number]   Diastolic dysfunction [id number]   OSA (obstructive sleep apnea) 07/08/2017   SOB (shortness of breath) 07/02/2017   Chronic respiratory failure with hypoxia and hypercapnia ([place]) 07/02/2017   Cardiomegaly 07/02/2017   Exposure keratitis 02/17/2017   Pedal edema 12/18/2016   Transaminitis 12/18/2016   Bilateral pes planus 12/18/2016   Psoriasis-eczema overlap condition 07/30/2016   High serum high density lipoprotein (HDL) 09/26/2015   Epistaxis 09/26/2015   Renal artery stenosis in 1 of 2 vessels (HCC)    Essential tremor    Elevated PSA    GERD (gastroesophageal reflux disease)    Health maintenance examination 10/08/2014   Anxiety state 10/08/2014   Obesity, Class I, BMI 30-34.9    Mild intermittent asthma in adult without complication    Seasonal allergies    HTN (hypertension)    Scoliosis    Osteoporosis    Past Medical History:  Diagnosis Date   Alcohol use disorder, mild, abuse    Anemia    Anxiety    situational   Arthritis    Phreesia 02/21/2020   Asthma    Phreesia 02/21/2020   BPH (benign prostatic hyperplasia)    on flomax- Patien tand wife denies- and patient has never been on Flomax   Bundle branch block    per prior PCP records   CHF (congestive heart failure) ([place])    Chronic kidney disease    Acute Renal Failure- June 2021- admission- kidneys recovered quickly   Complication of anesthesia 03/11/2019   woke up before surgery was over- "paralytic"   COPD (chronic obstructive pulmonary disease) ([place])    Diverticulosis    by colonoscopy   Eczema    per prior PCP records   Elevated PSA 2015   peaked 6s, s/p benign biopsy 2014, sees urology  [name]   Essential tremor    GERD (gastroesophageal reflux disease)    per prior PCP records   History of panic attacks    per prior PCP records   HTN (hypertension)    Hypertension    Phreesia 02/21/2020   Mild intermittent asthma in adult without complication    Obesity, Class I, BMI 30-34.9    Osteoporosis    DEXA 06/2013 with osteopenia - h/o ?wrist/hip fracture from AVN from chronic steroid use (asthma), took reclast for 1 year   Pneumonia    10/08/19 >  10 years ago   Seasonal allergies    Sleep apnea    Substance abuse ([place])    Phreesia 02/21/2020   Thoracic scoliosis childhood   Transaminitis    per prior PCP records   Vitamin B 12 deficiency     Family History  Problem Relation Age of Onset   Cancer Father 70       colon   Prostate cancer Father    Diabetes Mother    Alcohol abuse Mother    Stroke Mother    Cancer Maternal Grandmother        pancreatic   Ataxia Brother    Ataxia Sister    CAD Neg Hx     Past Surgical History:  Procedure Laterality Date   APPLICATION OF WOUND VAC Left 05/09/2018   Procedure: Application Of Wound Vac;  Surgeon: Newt Minion, MD;  Location: [place];  Service: Orthopedics;  Laterality: Left;   BIOPSY  04/30/2019   Procedure: BIOPSY;  Surgeon: Juanita Craver, MD;  Location[facility];  Service: Endoscopy;;   COLONOSCOPY  12/2013   polyps, int hem, diverticulosis, rpt 5 yrs [name])   COLONOSCOPY WITH PROPOFOL N/A 04/30/2019   Procedure: COLONOSCOPY WITH PROPOFOL;  Surgeon: Juanita Craver, MD;  [facility];  Service: Endoscopy;  Laterality: N/A;   ELBOW SURGERY Right 2008   golfer's elbow   ESOPHAGOGASTRODUODENOSCOPY (EGD) WITH PROPOFOL N/A 04/30/2019   Procedure: ESOPHAGOGASTRODUODENOSCOPY (EGD) WITH PROPOFOL;  Surgeon: Juanita Craver, MD;  [facility];  Service: Endoscopy;  Laterality: N/A;   HERNIA REPAIR N/A    Phreesia 02/21/2020   INCISIONAL HERNIA REPAIR N/A 10/15/2019   Procedure: HERNIA REPAIR  INCISIONAL WITH MESH, TAR;  Surgeon: Ralene Ok, MD;  Location: [facility];  Service: General;  Laterality: N/A;   INGUINAL HERNIA REPAIR Bilateral childhood & ~1995   LAPAROTOMY N/A 01/05/2019   Procedure: EXPLORATORY LAPAROTOMY graham patch repair;  Surgeon: Benjamine Sprague, DO;  Location: [facility];  Service: General;  Laterality: N/A;   LAPAROTOMY N/A 10/15/2019   Procedure: EXPLORATORY LAPAROTOMY;  Surgeon: Ralene Ok, MD;  Location: [facility];  Service: General;  Laterality: N/A;   LYSIS OF ADHESION N/A 10/15/2019   Procedure: LYSIS OF ADHESION;  Surgeon: Ralene Ok, MD;  Location: [facility];  Service: General;  Laterality: N/A;   NASAL SEPTUM SURGERY  2013   deviated septum   ORIF ANKLE FRACTURE Left 05/09/2018   Procedure: OPEN REDUCTION INTERNAL FIXATION LEFT LISFRANC FRACTURE/DISLOCATION;  Surgeon: Newt Minion, MD;  Location: [facility];  Service: Orthopedics;  Laterality: Left;   POLYPECTOMY  04/30/2019   Procedure: POLYPECTOMY;  Surgeon: Juanita Craver, MD;  [facility];  Service: Endoscopy;;   TEE WITHOUT CARDIOVERSION N/A 06/29/2019   Procedure: TRANSESOPHAGEAL ECHOCARDIOGRAM (TEE);  Surgeon: Acie Fredrickson Wonda Cheng, MD;  Location: [facility];  Service: Cardiovascular;  Laterality: N/A;   US ECHOCARDIOGRAPHY  02/2009   WNL, EF >55% Claiborne [place])   US RENAL/AORTA Right 05/2009   1-59% diameter reduction renal artery, rec rpt 2 yrs   VASECTOMY N/A    Phreesia 02/21/2020   Social History   Occupational History   Not on file  Tobacco Use   Smoking status: Never   Smokeless tobacco: Never  Vaping Use   Vaping Use: Never used  Substance and Sexual Activity   Alcohol use: Not Currently   Drug use: No   Sexual activity: Yes    Birth control/protection: None

## 2020-10-03 DIAGNOSIS — F329 Major depressive disorder, single episode, unspecified: Secondary | ICD-10-CM | POA: Diagnosis not present

## 2020-10-03 DIAGNOSIS — F1020 Alcohol dependence, uncomplicated: Secondary | ICD-10-CM | POA: Diagnosis not present

## 2020-10-04 DIAGNOSIS — J9601 Acute respiratory failure with hypoxia: Secondary | ICD-10-CM | POA: Diagnosis not present

## 2020-10-09 ENCOUNTER — Encounter: Payer: Self-pay | Admitting: Orthopedic Surgery

## 2020-10-19 DIAGNOSIS — Z8719 Personal history of other diseases of the digestive system: Secondary | ICD-10-CM | POA: Diagnosis not present

## 2020-10-24 ENCOUNTER — Encounter (INDEPENDENT_AMBULATORY_CARE_PROVIDER_SITE_OTHER): Payer: Self-pay

## 2020-10-25 DIAGNOSIS — F1020 Alcohol dependence, uncomplicated: Secondary | ICD-10-CM | POA: Diagnosis not present

## 2020-10-25 DIAGNOSIS — F329 Major depressive disorder, single episode, unspecified: Secondary | ICD-10-CM | POA: Diagnosis not present

## 2020-11-04 DIAGNOSIS — R062 Wheezing: Secondary | ICD-10-CM | POA: Diagnosis not present

## 2020-11-04 DIAGNOSIS — J9601 Acute respiratory failure with hypoxia: Secondary | ICD-10-CM | POA: Diagnosis not present

## 2020-11-06 ENCOUNTER — Encounter: Payer: Self-pay | Admitting: Orthopedic Surgery

## 2020-11-10 ENCOUNTER — Encounter: Payer: Self-pay | Admitting: Orthopedic Surgery

## 2020-11-10 ENCOUNTER — Telehealth (INDEPENDENT_AMBULATORY_CARE_PROVIDER_SITE_OTHER): Payer: BC Managed Care – PPO | Admitting: Family Medicine

## 2020-11-10 VITALS — HR 61

## 2020-11-10 DIAGNOSIS — E538 Deficiency of other specified B group vitamins: Secondary | ICD-10-CM

## 2020-11-10 DIAGNOSIS — G629 Polyneuropathy, unspecified: Secondary | ICD-10-CM

## 2020-11-10 MED ORDER — GABAPENTIN 300 MG PO CAPS: 300.0000 mg | ORAL_CAPSULE | Freq: Three times a day (TID) | ORAL | 1 refills | Status: DC

## 2020-11-10 NOTE — Progress Notes (Signed)
Virtual Visit via Video Note  I connected with Louis [name] on 11/10/20 at 5:07 PM by a video enabled telemedicine application and verified that I am speaking with the correct person using two identifiers.  Patient location: home with wife - consent given.  My location: office [place].    I discussed the limitations, risks, security and privacy concerns of performing an evaluation and management service by telephone and the availability of in person appointments. I also discussed with the patient that there may be a patient responsible charge related to this service. The patient expressed understanding and agreed to proceed, consent obtained  Chief complaint:  Chief Complaint  Patient presents with   Peripheral Neuropathy    Pt reports worsened neuropathy in both feet typically takes 1-2 gabapentin daily, notes exercise worsens, pt is also having procedure done next week      History of Present Illness: Louis [name] is a 65 y.o. male  Peripheral neuropathy Last discussed in July.  Previously treated by neurology.  Gabapentin 300 mg twice daily.  Foot symptoms were stable at that time.  We did discuss some pedal edema previously.  Treated with amlodipine, losartan, HCTZ and Toprol for hypertension.  Discussed sodium restriction in the diet, foot elevation, construction stockings if needed.  Also with history of B12 deficiency per chart review.  Typically taking gabapentin once per day but more exercise in past month, and has had some escalating burning pain in feet. Has been taking 2nd dose if bothering more at night. More symptoms at night. Every other day for 2nd dose. Has taken 3 per day at times without new side effects.  Lab Results  Component Value Date   [id number] 202 07/21/2019  No current B12 supplement, but better diet. Takes MVI QD  Plan for foot surgery on the left on November 16.  Dr. Sharol Given. Prior surgery in May 2020 for lisfranc fx. Great toe is migrating.  Shaving of bone and temporary pins. 2 week immobilization.  On naltrexone. Would like to remain on naltrexone.  Unable to take nsaids d/t prior ulcer.  Lab Results  Component Value Date   ALT 14 07/14/2020   AST 16 07/14/2020   ALKPHOS 69 07/14/2020   BILITOT 1.2 07/14/2020     Lab Results  Component Value Date   [id number] 283 07/21/2019     Patient Active Problem List   Diagnosis Date Noted   Skin lesion of face 06/14/2020   Unilateral primary osteoarthritis, right knee 01/06/2020   S/P hernia repair 10/15/2019   Neuropathy 07/20/2019   Macrocytic anemia    Debility 07/07/2019   Atelectasis    Enterococcal bacteremia 06/26/2019   Perforated viscus 01/06/2019   Onychomycosis 05/19/2018   Foot fracture, left, closed, initial encounter 05/09/2018   Lisfranc dislocation, left, initial encounter    Primary pulmonary hypertension ([place]) 15/17/6160   Diastolic dysfunction 73/71/0626   OSA (obstructive sleep apnea) 07/08/2017   SOB (shortness of breath) 07/02/2017   Chronic respiratory failure with hypoxia and hypercapnia ([place]) 07/02/2017   Cardiomegaly 07/02/2017   Exposure keratitis 02/17/2017   Pedal edema 12/18/2016   Transaminitis 12/18/2016   Bilateral pes planus 12/18/2016   Psoriasis-eczema overlap condition 07/30/2016   High serum high density lipoprotein (HDL) 09/26/2015   Epistaxis 09/26/2015   Renal artery stenosis in 1 of 2 vessels (HCC)    Essential tremor    Elevated PSA    GERD (gastroesophageal reflux disease)    Health maintenance examination 10/08/2014  Anxiety state 10/08/2014   Obesity, Class I, BMI 30-34.9    Mild intermittent asthma in adult without complication    Seasonal allergies    HTN (hypertension)    Scoliosis    Osteoporosis    Past Medical History:  Diagnosis Date   Alcohol use disorder, mild, abuse    Anemia    Anxiety    situational   Arthritis    Phreesia 02/21/2020   Asthma    Phreesia 02/21/2020   BPH (benign  prostatic hyperplasia)    on flomax- Patien tand wife denies- and patient has never been on Flomax   Bundle branch block    per prior PCP records   CHF (congestive heart failure) ([place])    Chronic kidney disease    Acute Renal Failure- June 2021- admission- kidneys recovered quickly   Complication of anesthesia 03/11/2019   woke up before surgery was over- "paralytic"   COPD (chronic obstructive pulmonary disease) ([place])    Diverticulosis    by colonoscopy   Eczema    per prior PCP records   Elevated PSA 2015   peaked 6s, s/p benign biopsy 2014, sees urology [name]   Essential tremor    GERD (gastroesophageal reflux disease)    per prior PCP records   History of panic attacks    per prior PCP records   HTN (hypertension)    Hypertension    Phreesia 02/21/2020   Mild intermittent asthma in adult without complication    Obesity, Class I, BMI 30-34.9    Osteoporosis    DEXA 06/2013 with osteopenia - h/o ?wrist/hip fracture from AVN from chronic steroid use (asthma), took reclast for 1 year   Pneumonia    10/08/19 >10 years ago   Seasonal allergies    Sleep apnea    Substance abuse ([place])    Phreesia 02/21/2020   Thoracic scoliosis childhood   Transaminitis    per prior PCP records   Vitamin B 12 deficiency    Past Surgical History:  Procedure Laterality Date   APPLICATION OF WOUND VAC Left 05/09/2018   Procedure: Application Of Wound Vac;  Surgeon: Newt Minion, MD;  Location: [facility];  Service: Orthopedics;  Laterality: Left;   BIOPSY  04/30/2019   Procedure: BIOPSY;  Surgeon: Juanita Craver, MD;  [facility];  Service: Endoscopy;;   COLONOSCOPY  12/2013   polyps, int hem, diverticulosis, rpt 5 yrs [name])   COLONOSCOPY WITH PROPOFOL N/A 04/30/2019   Procedure: COLONOSCOPY WITH PROPOFOL;  Surgeon: Juanita Craver, MD;  [facility];  Service: Endoscopy;  Laterality: N/A;   ELBOW SURGERY Right 2008   golfer's elbow   ESOPHAGOGASTRODUODENOSCOPY (EGD)  WITH PROPOFOL N/A 04/30/2019   Procedure: ESOPHAGOGASTRODUODENOSCOPY (EGD) WITH PROPOFOL;  Surgeon: Juanita Craver, MD;  [facility];  Service: Endoscopy;  Laterality: N/A;   HERNIA REPAIR N/A    Phreesia 02/21/2020   INCISIONAL HERNIA REPAIR N/A 10/15/2019   Procedure: HERNIA REPAIR INCISIONAL WITH MESH, TAR;  Surgeon: Ralene Ok, MD;  Location: [facility];  Service: General;  Laterality: N/A;   INGUINAL HERNIA REPAIR Bilateral childhood & ~1995   LAPAROTOMY N/A 01/05/2019   Procedure: EXPLORATORY LAPAROTOMY graham patch repair;  Surgeon: Benjamine Sprague, DO;  Location: [facility];  Service: General;  Laterality: N/A;   LAPAROTOMY N/A 10/15/2019   Procedure: EXPLORATORY LAPAROTOMY;  Surgeon: Ralene Ok, MD;  Location: [facility];  Service: General;  Laterality: N/A;   LYSIS OF ADHESION N/A 10/15/2019   Procedure: LYSIS  OF ADHESION;  Surgeon: Ralene Ok, MD;  Location: [facility];  Service: General;  Laterality: N/A;   NASAL SEPTUM SURGERY  Mar 26, 2011   deviated septum   ORIF ANKLE FRACTURE Left 05/09/2018   Procedure: OPEN REDUCTION INTERNAL FIXATION LEFT LISFRANC FRACTURE/DISLOCATION;  Surgeon: Newt Minion, MD;  Location: [facility];  Service: Orthopedics;  Laterality: Left;   POLYPECTOMY  04/30/2019   Procedure: POLYPECTOMY;  Surgeon: Juanita Craver, MD;  [facility];  Service: Endoscopy;;   TEE WITHOUT CARDIOVERSION N/A 06/29/2019   Procedure: TRANSESOPHAGEAL ECHOCARDIOGRAM (TEE);  Surgeon: Acie Fredrickson Wonda Cheng, MD;  [facility];  Service: Cardiovascular;  Laterality: N/A;   US ECHOCARDIOGRAPHY  02/2009   WNL, EF >55% Claiborne Billings)   US RENAL/AORTA Right 05/2009   1-59% diameter reduction renal artery, rec rpt 2 yrs   VASECTOMY N/A    Phreesia 02/21/2020   Allergies  Allergen Reactions   Accupril [Quinapril Hcl] Cough   Nsaids     Perforated gastric ulcer   Other    Prior to Admission medications   Medication Sig Start Date End Date Taking? Authorizing Provider  acetaminophen  (TYLENOL) 500 MG tablet Take 500 mg by mouth every 6 (six) hours as needed for mild pain.   Yes [provider]  albuterol (VENTOLIN HFA) 108 (90 Base) MCG/ACT inhaler Inhale 2 puffs into the lungs every 6 (six) hours as needed for wheezing or shortness of breath. 06/20/20  Yes Magdalen Spatz, NP  amLODipine (NORVASC) 5 MG tablet Take 1 tablet (5 mg total) by mouth daily. 07/14/20  Yes Wendie Agreste, MD  diclofenac Sodium (VOLTAREN) 1 % GEL Apply topically 4 (four) times daily.   Yes [provider]  gabapentin (NEURONTIN) 300 MG capsule Take 1 capsule (300 mg total) by mouth 2 (two) times daily. 10/26/19  Yes Kathrynn Ducking, MD  hydrochlorothiazide (HYDRODIURIL) 12.5 MG tablet Take 1 tablet (12.5 mg total) by mouth daily. 07/14/20  Yes Wendie Agreste, MD  losartan (COZAAR) 100 MG tablet TAKE 1 TABLET(100 MG) BY MOUTH DAILY 08/16/20  Yes Troy Sine, MD  metoprolol succinate (TOPROL-XL) 50 MG 24 hr tablet TAKE 1 TABLET(50 MG) BY MOUTH DAILY WITH A MEAL 07/14/20  Yes Wendie Agreste, MD  Multiple Vitamin (MULTIVITAMIN WITH MINERALS) TABS tablet Take 1 tablet by mouth daily. 07/08/19  Yes Dahal, Marlowe Aschoff, MD  naltrexone (DEPADE) 50 MG tablet Take 1 tablet (50 mg total) by mouth daily. 07/14/20  Yes Wendie Agreste, MD  omeprazole (PRILOSEC) 20 MG capsule Take 20 mg by mouth daily.   Yes [provider]  traZODone (DESYREL) 50 MG tablet Take 1 tablet (50 mg total) by mouth at bedtime. 07/14/20  Yes Wendie Agreste, MD   Social History   Socioeconomic History   Marital status: Married    Spouse name: Not on file   Number of children: Not on file   Years of education: Not on file   Highest education level: Not on file  Occupational History   Not on file  Tobacco Use   Smoking status: Never   Smokeless tobacco: Never  Vaping Use   Vaping Use: Never used  Substance and Sexual Activity   Alcohol use: Not Currently   Drug use: No   Sexual activity: Yes     Birth control/protection: None  Other Topics Concern   Not on file  Social History Narrative   Lives with wife (retired Materials engineer), 91yo dog died 03-26-14   [place]  children   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  Normal respiratory effort, euthymic mood.  Normal speech.  All questions answered with understanding plan expressed. Assessment and Plan: Neuropathy - Plan: gabapentin (NEURONTIN) 300 MG capsule  B12 deficiency - Plan: B12, gabapentin (NEURONTIN) 300 MG capsule Peripheral neuropathy was increased symptoms recently.  Did report improved edema from last visit.  Likely related to increased activity but with history of B12 deficiency we will check B12 level, lab only visit.  Increase gabapentin to consistent twice daily dosing for the next few days, then 3 times daily dosing for improved control.  Recommend he discuss options with his surgeon, possibly anesthesia for pain control after upcoming procedure given continued need for naltrexone.  Ice, scheduled Tylenol, elevation and higher dose of gabapentin may be helpful.  Follow Up Instructions: As needed.   I discussed the assessment and treatment plan with the patient. The patient was provided an opportunity to ask questions and all were answered. The patient agreed with the plan and demonstrated an understanding of the instructions.   The patient was advised to call back or seek an in-person evaluation if the symptoms worsen or if the  condition fails to improve as anticipated.  Wendie Agreste, MD

## 2020-11-10 NOTE — Patient Instructions (Addendum)
Discuss planned surgery and post-op pain control with ortho and possibly anesthesia. Tylenol, ice, elevation may be sufficient for pain control.

## 2020-11-11 ENCOUNTER — Other Ambulatory Visit: Payer: Self-pay | Admitting: Family

## 2020-11-11 ENCOUNTER — Other Ambulatory Visit (INDEPENDENT_AMBULATORY_CARE_PROVIDER_SITE_OTHER): Payer: BC Managed Care – PPO

## 2020-11-11 DIAGNOSIS — E538 Deficiency of other specified B group vitamins: Secondary | ICD-10-CM

## 2020-11-11 DIAGNOSIS — F1020 Alcohol dependence, uncomplicated: Secondary | ICD-10-CM | POA: Diagnosis not present

## 2020-11-11 LAB — VITAMIN B12: Vitamin B-12: 629 pg/mL (ref 211–911)

## 2020-11-11 NOTE — Anesthesia Preprocedure Evaluation (Addendum)
Anesthesia Evaluation  Patient identified by MRN, date of birth, ID band Patient awake    Airway Mallampati: II  TM Distance: >3 FB Neck ROM: Full    Dental no notable dental hx.    Pulmonary asthma , sleep apnea and Continuous Positive Airway Pressure Ventilation , COPD (2L at night),  oxygen dependent,    Pulmonary exam normal breath sounds clear to auscultation       Cardiovascular Exercise Tolerance: Good hypertension, Pt. on medications + Peripheral Vascular Disease and +CHF  Normal cardiovascular exam Rhythm:Regular Rate:Normal   TEE 06/29/2019: 1. Left ventricular ejection fraction, by estimation, is 60 to 65%. The  left ventricle has normal function. The left ventricle has no regional  wall motion abnormalities.  2. Right ventricular systolic function is normal. The right ventricular  size is normal.  3. No left atrial/left atrial appendage thrombus was detected.  4. The mitral valve is grossly normal. Mild to moderate mitral valve  regurgitation. No evidence of mitral stenosis.  5. The aortic valve is normal in structure. Aortic valve regurgitation is  trivial. No aortic stenosis is present.    Neuro/Psych Anxiety    GI/Hepatic GERD  ,(+)     substance abuse (h/o alcohol abuse on naltrexone)  alcohol use, Last dose of naltrexone Monday 11/14/20   Endo/Other  negative endocrine ROS  Renal/GU Renal InsufficiencyRenal disease     Musculoskeletal  (+) Arthritis ,   Abdominal   Peds  Hematology  (+) FACTOR VIII (HEMOPHILIA) ,   Anesthesia Other Findings   Reproductive/Obstetrics                           Anesthesia Physical Anesthesia Plan  ASA: 4  Anesthesia Plan: MAC and Regional   Post-op Pain Management:    Induction: Intravenous  PONV Risk Score and Plan: Propofol infusion, TIVA, Treatment may vary due to age or medical condition, Midazolam and  Ondansetron  Airway Management Planned: Natural Airway and Simple Face Mask  Additional Equipment: None  Intra-op Plan:   Post-operative Plan:   Informed Consent: I have reviewed the patients History and Physical, chart, labs and discussed the procedure including the risks, benefits and alternatives for the proposed anesthesia with the patient or authorized representative who has indicated his/her understanding and acceptance.     Dental advisory given  Plan Discussed with: Anesthesiologist and CRNA  Anesthesia Plan Comments: (Please see progress notes related to postoperative pain and naltrexone use.   PAT note by Karoline Caldwell, PA-C: Follows with pulmonology for history of OSA on CPAP with 2 L supplemental O2 nightly, mild pulmonary obstruction on PFTs.  Last seen by pulmonology 06/20/2020, excellent CPAP compliance noted.  History of EtOH abuse, per PCP notes of sober since January 18.  Takes naltrexone 50 mg daily.    Hx of mild-mod MR on echo 06/2019.  See Dr. Glynda Jaeger note 11/11/2020 regarding perioperative pain control.  Pt will need DOS labs and eval.  EKG 07/30/2019: NSR.  Rate 71.  PFT 08/15/2017: FVC-%Pred-Pre Latest Units: % 68 FEV1-%Pred-Pre Latest Units: % 58 FEV1FVC-%Pred-Pre Latest Units: % 84 TLC % pred Latest Units: % 90 RV % pred Latest Units: % 133 DLCO unc % pred Latest Units: %    TEE 06/29/2019: 1. Left ventricular ejection fraction, by estimation, is 60 to 65%. The  left ventricle has normal function. The left ventricle has no regional  wall motion abnormalities.  2. Right ventricular systolic function is  normal. The right ventricular  size is normal.  3. No left atrial/left atrial appendage thrombus was detected.  4. The mitral valve is grossly normal. Mild to moderate mitral valve  regurgitation. No evidence of mitral stenosis.  5. The aortic valve is normal in structure. Aortic valve regurgitation is  trivial. No aortic stenosis  is present.   TTE 06/26/2019 (done in setting of sepsis secondary to Enterococcus): 1. Left ventricular ejection fraction, by estimation, is 55 to 60%. The  left ventricle has normal function. The left ventricle has no regional  wall motion abnormalities. Left ventricular diastolic parameters were  normal.  2. Right ventricular systolic function is mildly reduced at the RV apex.  The right ventricular size is normal. Tricuspid regurgitation signal is  inadequate for assessing PA pressure.  3. LA not well visualized. Left atrial size was mildly dilated by visual  estimate.  4. The mitral valve is normal in structure. Trivial mitral valve  regurgitation. No evidence of mitral stenosis.  5. The aortic valve is tricuspid. Aortic valve regurgitation is not  visualized. No aortic stenosis is present.  6. Aortic dilatation noted. There is mild dilatation of the aortic root  measuring 43 mm.  7. The inferior vena cava is normal in size with greater than 50%  respiratory variability, suggesting right atrial pressure of 3 mmHg.    )     Anesthesia Quick Evaluation

## 2020-11-11 NOTE — Progress Notes (Signed)
I discussed Louis [name]'s naltrexone use and his pain concerns related to this surgery.  This procedure can be done without narcotics, but agree that he needs a plan for when the block wears off postoperatively.  Ideally, discontinuing the naltrexone safely prior to surgery would be the best solution.  He is going to discuss potential solutions with the physician who initially prescribed the drug and determine the best path regarding continuing verse stopping and we can manage the anesthetic based on this decision. This physician should provide guidance on if and how to d/c the medicine. Increasing his gabapentin can also help control his post op pain.

## 2020-11-15 ENCOUNTER — Other Ambulatory Visit: Payer: Self-pay

## 2020-11-15 ENCOUNTER — Encounter (HOSPITAL_COMMUNITY): Payer: Self-pay | Admitting: Orthopedic Surgery

## 2020-11-15 NOTE — Progress Notes (Signed)
PCP - Dr. Carlota Raspberry Cardiologist - Dr. Claiborne Billings EKG - DOS Chest x-ray -  ECHO - 06/29/19 Cardiac Cath -  CPAP -   ERAS Protcol - ERAS 0530  COVID TEST- n/a  Anesthesia review: yes  -------------  SDW INSTRUCTIONS:  Your procedure is scheduled on Wednesday 11/16. Please report to [facility] Main Entrance "A" at [email or web address]., and check in at the Admitting office. Call this number if you have problems the morning of surgery: [phone]   Remember: Do not eat after midnight the night before your surgery  You may drink clear liquids until 0530 the morning of your surgery.   Clear liquids allowed are: Water, Non-Citrus Juices (without pulp), Carbonated Beverages, Clear Tea, Black Coffee Only, and Gatorade   Medications to take morning of surgery with a sip of water include: acetaminophen (TYLENOL) - if needed amLODipine (NORVASC)  gabapentin (NEURONTIN) metoprolol succinate (TOPROL-XL)  omeprazole (PRILOSEC)  As of today, STOP taking any Aspirin (unless otherwise instructed by your surgeon), Aleve, Naproxen, Ibuprofen, Motrin, Advil, Goody's, BC's, all herbal medications, fish oil, and all vitamins.    The Morning of Surgery Do not wear jewelry Do not wear lotions, powders, colognes, or deodorant  Do not bring valuables to the hospital. [facility] is not responsible for any belongings or valuables.  If you are a smoker, DO NOT Smoke 24 hours prior to surgery  If you wear a CPAP at night please bring your mask the morning of surgery   Remember that you must have someone to transport you home after your surgery, and remain with you for 24 hours if you are discharged the same day.  Please bring cases for contacts, glasses, hearing aids, dentures or bridgework because it cannot be worn into surgery.   Patients discharged the day of surgery will not be allowed to drive home.   Please shower the NIGHT BEFORE/MORNING OF SURGERY (use antibacterial soap like DIAL soap if possible).  Wear comfortable clothes the morning of surgery. Oral Hygiene is also important to reduce your risk of infection.  Remember - BRUSH YOUR TEETH THE MORNING OF SURGERY WITH YOUR REGULAR TOOTHPASTE  Patient denies shortness of breath, fever, cough and chest pain.

## 2020-11-15 NOTE — Progress Notes (Signed)
Anesthesia Chart Review: Same day workup  Follows with pulmonology for history of OSA on CPAP with 2 L supplemental O2 nightly, mild pulmonary obstruction on PFTs.  Last seen by pulmonology 06/20/2020, excellent CPAP compliance noted.  History of EtOH abuse, per PCP notes of sober since January 18.  Takes naltrexone 50 mg daily.    Hx of mild-mod MR on echo 06/2019.  See Dr. Glynda Jaeger note 11/11/2020 regarding perioperative pain control.  Pt will need DOS labs and eval.  EKG 07/30/2019: NSR.  Rate 71.  PFT 08/15/2017: FVC-%Pred-Pre Latest Units: % 68  FEV1-%Pred-Pre Latest Units: % 58  FEV1FVC-%Pred-Pre Latest Units: % 84  TLC % pred Latest Units: % 90  RV % pred Latest Units: % 133  DLCO unc % pred Latest Units: %     TEE 06/29/2019:  1. Left ventricular ejection fraction, by estimation, is 60 to 65%. The  left ventricle has normal function. The left ventricle has no regional  wall motion abnormalities.   2. Right ventricular systolic function is normal. The right ventricular  size is normal.   3. No left atrial/left atrial appendage thrombus was detected.   4. The mitral valve is grossly normal. Mild to moderate mitral valve  regurgitation. No evidence of mitral stenosis.   5. The aortic valve is normal in structure. Aortic valve regurgitation is  trivial. No aortic stenosis is present.    TTE 06/26/2019 (done in setting of sepsis secondary to Enterococcus):  1. Left ventricular ejection fraction, by estimation, is 55 to 60%. The  left ventricle has normal function. The left ventricle has no regional  wall motion abnormalities. Left ventricular diastolic parameters were  normal.   2. Right ventricular systolic function is mildly reduced at the RV apex.  The right ventricular size is normal. Tricuspid regurgitation signal is  inadequate for assessing PA pressure.   3. LA not well visualized. Left atrial size was mildly dilated by visual  estimate.   4. The mitral valve is  normal in structure. Trivial mitral valve  regurgitation. No evidence of mitral stenosis.   5. The aortic valve is tricuspid. Aortic valve regurgitation is not  visualized. No aortic stenosis is present.   6. Aortic dilatation noted. There is mild dilatation of the aortic root  measuring 43 mm.   7. The inferior vena cava is normal in size with greater than 50%  respiratory variability, suggesting right atrial pressure of 3 mmHg.    [facility] Short Stay Center/Anesthesiology Phone [phone] 11/15/2020 1:08 PM

## 2020-11-16 ENCOUNTER — Encounter (HOSPITAL_COMMUNITY)
Admission: RE | Disposition: A | Discharge status: Home / Self Care | Payer: Self-pay | Source: Home / Self Care | Attending: Orthopedic Surgery

## 2020-11-16 ENCOUNTER — Ambulatory Visit (HOSPITAL_COMMUNITY): Payer: BC Managed Care – PPO | Admitting: Vascular Surgery

## 2020-11-16 ENCOUNTER — Encounter (HOSPITAL_COMMUNITY): Payer: Self-pay | Admitting: Orthopedic Surgery

## 2020-11-16 ENCOUNTER — Ambulatory Visit (HOSPITAL_COMMUNITY)
Admission: RE | Admit: 2020-11-16 | Discharge: 2020-11-16 | Disposition: A | Discharge status: Home / Self Care | Payer: BC Managed Care – PPO | Attending: Orthopedic Surgery | Admitting: Orthopedic Surgery

## 2020-11-16 ENCOUNTER — Telehealth: Payer: Self-pay

## 2020-11-16 DIAGNOSIS — I739 Peripheral vascular disease, unspecified: Secondary | ICD-10-CM | POA: Insufficient documentation

## 2020-11-16 DIAGNOSIS — M1990 Unspecified osteoarthritis, unspecified site: Secondary | ICD-10-CM | POA: Insufficient documentation

## 2020-11-16 DIAGNOSIS — F419 Anxiety disorder, unspecified: Secondary | ICD-10-CM | POA: Diagnosis not present

## 2020-11-16 DIAGNOSIS — N189 Chronic kidney disease, unspecified: Secondary | ICD-10-CM | POA: Insufficient documentation

## 2020-11-16 DIAGNOSIS — D66 Hereditary factor VIII deficiency: Secondary | ICD-10-CM | POA: Diagnosis not present

## 2020-11-16 DIAGNOSIS — Z9981 Dependence on supplemental oxygen: Secondary | ICD-10-CM | POA: Insufficient documentation

## 2020-11-16 DIAGNOSIS — G4733 Obstructive sleep apnea (adult) (pediatric): Secondary | ICD-10-CM | POA: Diagnosis not present

## 2020-11-16 DIAGNOSIS — M21542 Acquired clubfoot, left foot: Secondary | ICD-10-CM | POA: Diagnosis not present

## 2020-11-16 DIAGNOSIS — M21612 Bunion of left foot: Secondary | ICD-10-CM | POA: Diagnosis not present

## 2020-11-16 DIAGNOSIS — M205X2 Other deformities of toe(s) (acquired), left foot: Secondary | ICD-10-CM | POA: Diagnosis not present

## 2020-11-16 DIAGNOSIS — I130 Hypertensive heart and chronic kidney disease with heart failure and stage 1 through stage 4 chronic kidney disease, or unspecified chronic kidney disease: Secondary | ICD-10-CM | POA: Diagnosis not present

## 2020-11-16 DIAGNOSIS — K219 Gastro-esophageal reflux disease without esophagitis: Secondary | ICD-10-CM | POA: Diagnosis not present

## 2020-11-16 DIAGNOSIS — I10 Essential (primary) hypertension: Secondary | ICD-10-CM | POA: Diagnosis not present

## 2020-11-16 DIAGNOSIS — I509 Heart failure, unspecified: Secondary | ICD-10-CM | POA: Diagnosis not present

## 2020-11-16 DIAGNOSIS — J449 Chronic obstructive pulmonary disease, unspecified: Secondary | ICD-10-CM | POA: Insufficient documentation

## 2020-11-16 HISTORY — PX: WEIL OSTEOTOMY: SHX5044

## 2020-11-16 LAB — CBC
HCT: 39.2 % (ref 39.0–52.0)
Hemoglobin: 12.8 g/dL — ABNORMAL LOW (ref 13.0–17.0)
MCH: 30.2 pg (ref 26.0–34.0)
MCHC: 32.7 g/dL (ref 30.0–36.0)
MCV: 92.5 fL (ref 80.0–100.0)
Platelets: 304 10*3/uL (ref 150–400)
RBC: 4.24 MIL/uL (ref 4.22–5.81)
RDW: 12.9 % (ref 11.5–15.5)
WBC: 5.8 10*3/uL (ref 4.0–10.5)
nRBC: 0 % (ref 0.0–0.2)

## 2020-11-16 LAB — BASIC METABOLIC PANEL
Anion gap: 7 (ref 5–15)
BUN: 14 mg/dL (ref 8–23)
CO2: 27 mmol/L (ref 22–32)
Calcium: 9 mg/dL (ref 8.9–10.3)
Chloride: 100 mmol/L (ref 98–111)
Creatinine, Ser: 0.98 mg/dL (ref 0.61–1.24)
GFR, Estimated: >60 mL/min (ref >60)
Glucose, Bld: 98 mg/dL (ref 70–99)
Potassium: 3.8 mmol/L (ref 3.5–5.1)
Sodium: 134 mmol/L — ABNORMAL LOW (ref 135–145)

## 2020-11-16 SURGERY — OSTEOTOMY, WEIL
Anesthesia: Monitor Anesthesia Care | Laterality: Left

## 2020-11-16 MED ORDER — ONDANSETRON HCL 4 MG/2ML IJ SOLN
INTRAMUSCULAR | Status: AC
  Filled 2020-11-16: qty 2

## 2020-11-16 MED ORDER — LACTATED RINGERS IV SOLN: INTRAVENOUS | Status: DC

## 2020-11-16 MED ORDER — PROPOFOL 500 MG/50ML IV EMUL
INTRAVENOUS | Status: DC | PRN
  Administered 2020-11-16: 125 ug/kg/min via INTRAVENOUS

## 2020-11-16 MED ORDER — DROPERIDOL 2.5 MG/ML IJ SOLN: 0.6250 mg | Freq: Once | INTRAMUSCULAR | Status: DC | PRN

## 2020-11-16 MED ORDER — FENTANYL CITRATE (PF) 250 MCG/5ML IJ SOLN
INTRAMUSCULAR | Status: DC | PRN
  Administered 2020-11-16: 50 ug via INTRAVENOUS

## 2020-11-16 MED ORDER — PHENYLEPHRINE HCL (PRESSORS) 10 MG/ML IV SOLN
INTRAVENOUS | Status: AC
  Filled 2020-11-16: qty 1

## 2020-11-16 MED ORDER — ORAL CARE MOUTH RINSE: 15.0000 mL | Freq: Once | OROMUCOSAL | Status: AC

## 2020-11-16 MED ORDER — PHENYLEPHRINE HCL-NACL 20-0.9 MG/250ML-% IV SOLN
INTRAVENOUS | Status: DC | PRN
  Administered 2020-11-16: 25 ug/min via INTRAVENOUS

## 2020-11-16 MED ORDER — ONDANSETRON HCL 4 MG/2ML IJ SOLN
INTRAMUSCULAR | Status: DC | PRN
  Administered 2020-11-16: 4 mg via INTRAVENOUS

## 2020-11-16 MED ORDER — MIDAZOLAM HCL 2 MG/2ML IJ SOLN
INTRAMUSCULAR | Status: AC
  Filled 2020-11-16: qty 2

## 2020-11-16 MED ORDER — FENTANYL CITRATE (PF) 100 MCG/2ML IJ SOLN: 25.0000 ug | INTRAMUSCULAR | Status: DC | PRN

## 2020-11-16 MED ORDER — MIDAZOLAM HCL 2 MG/2ML IJ SOLN
INTRAMUSCULAR | Status: DC | PRN
  Administered 2020-11-16: 1 mg via INTRAVENOUS

## 2020-11-16 MED ORDER — ACETAMINOPHEN 500 MG PO TABS
1000.0000 mg | ORAL_TABLET | Freq: Once | ORAL | Status: AC
  Administered 2020-11-16: 1000 mg via ORAL
  Filled 2020-11-16: qty 2

## 2020-11-16 MED ORDER — PROMETHAZINE HCL 25 MG/ML IJ SOLN: 6.2500 mg | INTRAMUSCULAR | Status: DC | PRN

## 2020-11-16 MED ORDER — 0.9 % SODIUM CHLORIDE (POUR BTL) OPTIME
TOPICAL | Status: DC | PRN
  Administered 2020-11-16: 1000 mL

## 2020-11-16 MED ORDER — PROPOFOL 1000 MG/100ML IV EMUL
INTRAVENOUS | Status: AC
  Filled 2020-11-16: qty 100

## 2020-11-16 MED ORDER — LIDOCAINE 2% (20 MG/ML) 5 ML SYRINGE
INTRAMUSCULAR | Status: DC | PRN
  Administered 2020-11-16: 20 mg via INTRAVENOUS

## 2020-11-16 MED ORDER — FENTANYL CITRATE (PF) 250 MCG/5ML IJ SOLN
INTRAMUSCULAR | Status: AC
  Filled 2020-11-16: qty 5

## 2020-11-16 MED ORDER — ROPIVACAINE HCL 5 MG/ML IJ SOLN
INTRAMUSCULAR | Status: DC | PRN
  Administered 2020-11-16: 30 mL via PERINEURAL

## 2020-11-16 MED ORDER — OXYCODONE HCL 5 MG/5ML PO SOLN: 5.0000 mg | Freq: Once | ORAL | Status: DC | PRN

## 2020-11-16 MED ORDER — CHLORHEXIDINE GLUCONATE 0.12 % MT SOLN
15.0000 mL | Freq: Once | OROMUCOSAL | Status: AC
  Administered 2020-11-16: 15 mL via OROMUCOSAL
  Filled 2020-11-16: qty 15

## 2020-11-16 MED ORDER — CEFAZOLIN SODIUM-DEXTROSE 2-4 GM/100ML-% IV SOLN
2.0000 g | INTRAVENOUS | Status: AC
  Administered 2020-11-16: 2 g via INTRAVENOUS
  Filled 2020-11-16: qty 100

## 2020-11-16 MED ORDER — OXYCODONE HCL 5 MG PO TABS: 5.0000 mg | ORAL_TABLET | Freq: Once | ORAL | Status: DC | PRN

## 2020-11-16 MED ORDER — OXYCODONE-ACETAMINOPHEN 5-325 MG PO TABS: 1.0000 | ORAL_TABLET | ORAL | 0 refills | Status: DC | PRN

## 2020-11-16 MED ORDER — PROPOFOL 10 MG/ML IV BOLUS
INTRAVENOUS | Status: AC
  Filled 2020-11-16: qty 20

## 2020-11-16 SURGICAL SUPPLY — 54 items
BAG COUNTER SPONGE SURGICOUNT (BAG) IMPLANT
BAG SURGICOUNT SPONGE COUNTING (BAG)
BIT DRILL 1.5 (BIT) ×1 IMPLANT
BLADE AVERAGE 25MMX9MM (BLADE) ×2
BLADE AVERAGE 25X9 (BLADE) ×4 IMPLANT
BLADE MINI RND TIP GREEN BEAV (BLADE) IMPLANT
BNDG COHESIVE 1X5 TAN STRL LF (GAUZE/BANDAGES/DRESSINGS) IMPLANT
BNDG COHESIVE 6X5 TAN NS LF (GAUZE/BANDAGES/DRESSINGS) ×3 IMPLANT
BNDG COHESIVE 6X5 TAN STRL LF (GAUZE/BANDAGES/DRESSINGS) IMPLANT
BNDG ESMARK 4X9 LF (GAUZE/BANDAGES/DRESSINGS) ×3 IMPLANT
BNDG GAUZE ELAST 4 BULKY (GAUZE/BANDAGES/DRESSINGS) ×3 IMPLANT
CAP PIN PROTECTOR ORTHO WHT (CAP) ×3 IMPLANT
CORD BIPOLAR FORCEPS 12FT (ELECTRODE) IMPLANT
COTTON STERILE ROLL (GAUZE/BANDAGES/DRESSINGS) IMPLANT
COVER SURGICAL LIGHT HANDLE (MISCELLANEOUS) ×6 IMPLANT
CUFF TOURN SGL QUICK 18X4 (TOURNIQUET CUFF) IMPLANT
CUFF TOURN SGL QUICK 24 (TOURNIQUET CUFF)
CUFF TRNQT CYL 24X4X16.5-23 (TOURNIQUET CUFF) IMPLANT
DRAPE C-ARM MINI 42X72 WSTRAPS (DRAPES) ×3 IMPLANT
DRAPE OEC MINIVIEW 54X84 (DRAPES) IMPLANT
DRAPE U-SHAPE 47X51 STRL (DRAPES) ×3 IMPLANT
DRILL BIT 1.5 (BIT) ×3
DRSG ADAPTIC 3X8 NADH LF (GAUZE/BANDAGES/DRESSINGS) ×3 IMPLANT
DURAPREP 26ML APPLICATOR (WOUND CARE) ×3 IMPLANT
ELECT REM PT RETURN 9FT ADLT (ELECTROSURGICAL) ×3
ELECTRODE REM PT RTRN 9FT ADLT (ELECTROSURGICAL) ×1 IMPLANT
GAUZE SPONGE 4X4 12PLY STRL (GAUZE/BANDAGES/DRESSINGS) ×3 IMPLANT
GLOVE SURG ORTHO LTX SZ9 (GLOVE) ×3 IMPLANT
GLOVE SURG UNDER POLY LF SZ9 (GLOVE) ×3 IMPLANT
GOWN STRL REUS W/ TWL XL LVL3 (GOWN DISPOSABLE) ×2 IMPLANT
GOWN STRL REUS W/TWL XL LVL3 (GOWN DISPOSABLE) ×6
K-WIRE 2.0X150M (WIRE) ×3
KIT BASIN OR (CUSTOM PROCEDURE TRAY) ×3 IMPLANT
KIT TURNOVER KIT B (KITS) ×3 IMPLANT
KWIRE 2.0X150M (WIRE) ×1 IMPLANT
MANIFOLD NEPTUNE II (INSTRUMENTS) ×3 IMPLANT
NEEDLE HYPO 25GX1X1/2 BEV (NEEDLE) IMPLANT
NS IRRIG 1000ML POUR BTL (IV SOLUTION) ×3 IMPLANT
PACK ORTHO EXTREMITY (CUSTOM PROCEDURE TRAY) ×3 IMPLANT
PAD ARMBOARD 7.5X6 YLW CONV (MISCELLANEOUS) ×6 IMPLANT
PAD CAST 4YDX4 CTTN HI CHSV (CAST SUPPLIES) IMPLANT
PADDING CAST COTTON 4X4 STRL (CAST SUPPLIES)
SCREW CORTEX SLFTPNG 12MM (Screw) ×6 IMPLANT
SPECIMEN JAR SMALL (MISCELLANEOUS) IMPLANT
SUCTION FRAZIER HANDLE 10FR (MISCELLANEOUS)
SUCTION TUBE FRAZIER 10FR DISP (MISCELLANEOUS) IMPLANT
SUT ETHILON 2 0 FS 18 (SUTURE) ×3 IMPLANT
SUT VIC AB 2-0 FS1 27 (SUTURE) ×3 IMPLANT
SYR CONTROL 10ML LL (SYRINGE) IMPLANT
TOWEL GREEN STERILE (TOWEL DISPOSABLE) ×3 IMPLANT
TOWEL GREEN STERILE FF (TOWEL DISPOSABLE) ×3 IMPLANT
TUBE CONNECTING 12'X1/4 (SUCTIONS)
TUBE CONNECTING 12X1/4 (SUCTIONS) IMPLANT
WATER STERILE IRR 1000ML POUR (IV SOLUTION) ×3 IMPLANT

## 2020-11-16 NOTE — Transfer of Care (Signed)
Immediate Anesthesia Transfer of Care Note  Patient: Louis [name]  Procedure(s) Performed: WEIL OSTEOTOMY LEFT 2ND METATARSAL,  2ND TOE DISTAL INTERPHALANGEAL RESECTION AND [name] OSTEOTOMY WEIL OSTEOTOMY THIRD METATARSAL (Left)  Patient Location: PACU  Anesthesia Type:MAC combined with regional for post-op pain  Level of Consciousness: awake, alert  and oriented  Airway & Oxygen Therapy: Patient Spontanous Breathing  Post-op Assessment: Report given to RN and Post -op Vital signs reviewed and stable  Post vital signs: Reviewed and stable  Last Vitals:  Vitals Value Taken Time  BP    Temp    Pulse 53 11/16/20 0927  Resp 17 11/16/20 0927  SpO2 100 % 11/16/20 0927  Vitals shown include unvalidated device data.  Last Pain:  Vitals:   11/16/20 0713  TempSrc:   PainSc: 0-No pain      Patients Stated Pain Goal: 0 (58/59/29 2446)  Complications: No notable events documented.

## 2020-11-16 NOTE — Telephone Encounter (Signed)
Louis [name] with [facility] would like to know if Rx for Oxycodone should be filled?  Stated that patient just had another Rx filled by another provider.  Cb# [phone].  Please advise.  Thank you.

## 2020-11-16 NOTE — Op Note (Signed)
11/16/2020  9:15 AM  PATIENT:  Louis [name]    PRE-OPERATIVE DIAGNOSIS:  Claw Toe and Bunion Deformity Left Foot  POST-OPERATIVE DIAGNOSIS:  Same  PROCEDURE:  WEIL OSTEOTOMY LEFT 2ND METATARSAL, AIKEN OSTEOTOMY GREAT TOE, WEIL OSTEOTOMY THIRD METATARSAL  SURGEON:  Newt Minion, MD  PHYSICIAN ASSISTANT:None ANESTHESIA:   General  PREOPERATIVE INDICATIONS:  Louis [name] is a  64 y.o. male with a diagnosis of Claw Toe and Bunion Deformity Left Foot who failed conservative measures and elected for surgical management.    The risks benefits and alternatives were discussed with the patient preoperatively including but not limited to the risks of infection, bleeding, nerve injury, cardiopulmonary complications, the need for revision surgery, among others, and the patient was willing to proceed.  OPERATIVE IMPLANTS: 2.0 mm K wire and 2 x 12 mm mini frag screws  _0 @  OPERATIVE FINDINGS: C-arm fluoroscopy verified alignment across the forefoot.  OPERATIVE PROCEDURE: Patient was brought the operating room after undergoing a regional anesthetic he then underwent a MAC anesthetic.  After adequate levels anesthesia obtained patient's left lower extremity was prepped using DuraPrep draped into a sterile field a timeout was called.  A medial incision was made over the great toe.  This was carried down to the proximal phalanx.  Retractors were placed and an Aiken osteotomy was performed for a closing wedge medially.  This was stabilized with a 2 mm K wire.  This slightly overcorrected the valgus to allow for alignment when the pin is removed.  Using the previous dorsal incision this was carried down through the second webspace.  Retractors were placed and a Weil osteotomy was performed the second metatarsal this was translated proximally overhanging bone was resected and this was stabilized with a 2 x 12 mm screw.  A Weil osteotomy was then performed on the third metatarsal of the Weil  osteotomies performed overhanging bone was resected the bone was shortened and stabilized with a 2 x 12 mm mini frag screw.  The wounds were irrigated normal saline electrocautery was used for hemostasis incision was closed in 2-0 nylon sterile dressing was applied.  Patient was taken the PACU in stable condition.   DISCHARGE PLANNING:  Antibiotic duration: Preoperative antibiotics  Weightbearing: Touchdown weightbearing on the left  Pain medication: Prescription for Percocet  Dressing care/ Wound VAC: Dry dressing  Ambulatory devices: Crutches walker or kneeling scooter  Discharge to: Home.  Follow-up: In the office 1 week post operative.

## 2020-11-16 NOTE — H&P (Signed)
Louis [name] is an 65 y.o. male.   Chief Complaint: Bunion and claw toe deformity left foot HPI: Patient is a 65 year old gentleman who presents complaining of overlapping of the great toe and second toe patient is concerned about developing ulcers and progression of the deformity.  Patient is status post open reduction internal fixation of a Lisfranc fracture dislocation in May 2020.  Patient states the great toe has moved on top of the second toe and the second toe has moved medially under the great toe.  Patient complains of pain with ambulation.  Past Medical History:  Diagnosis Date   Alcohol use disorder, mild, abuse    Anemia    Anxiety    situational   Arthritis    Phreesia 02/21/2020   Asthma    Phreesia 02/21/2020   BPH (benign prostatic hyperplasia)    on flomax- Patien tand wife denies- and patient has never been on Flomax   Bundle branch block    per prior PCP records   CHF (congestive heart failure) ([place])    Chronic kidney disease    Acute Renal Failure- June 2021- admission- kidneys recovered quickly   Complication of anesthesia 03/11/2019   woke up before surgery was over- "paralytic"   COPD (chronic obstructive pulmonary disease) ([place])    Diverticulosis    by colonoscopy   Eczema    per prior PCP records   Elevated PSA 2015   peaked 6s, s/p benign biopsy 2014, sees urology [name]   Essential tremor    GERD (gastroesophageal reflux disease)    per prior PCP records   History of panic attacks    per prior PCP records   HTN (hypertension)    Hypertension    Phreesia 02/21/2020   Mild intermittent asthma in adult without complication    Obesity, Class I, BMI 30-34.9    Osteoporosis    DEXA 06/2013 with osteopenia - h/o ?wrist/hip fracture from AVN from chronic steroid use (asthma), took reclast for 1 year   Pneumonia    10/08/19 >10 years ago   Seasonal allergies    Sleep apnea    Substance abuse ([place])    Phreesia 02/21/2020   Thoracic  scoliosis childhood   Transaminitis    per prior PCP records   Vitamin B 12 deficiency     Past Surgical History:  Procedure Laterality Date   APPLICATION OF WOUND VAC Left 05/09/2018   Procedure: Application Of Wound Vac;  Surgeon: Newt Minion, MD;  Location: [place];  Service: Orthopedics;  Laterality: Left;   BIOPSY  04/30/2019   Procedure: BIOPSY;  Surgeon: Juanita Craver, MD;  Location: [facility];  Service: Endoscopy;;   COLONOSCOPY  12/2013   polyps, int hem, diverticulosis, rpt 5 yrs [name])   COLONOSCOPY WITH PROPOFOL N/A 04/30/2019   Procedure: COLONOSCOPY WITH PROPOFOL;  Surgeon: Juanita Craver, MD;  Location: [facility];  Service: Endoscopy;  Laterality: N/A;   ELBOW SURGERY Right 2008   golfer's elbow   ESOPHAGOGASTRODUODENOSCOPY (EGD) WITH PROPOFOL N/A 04/30/2019   Procedure: ESOPHAGOGASTRODUODENOSCOPY (EGD) WITH PROPOFOL;  Surgeon: Juanita Craver, MD;  Location: [facility];  Service: Endoscopy;  Laterality: N/A;   HERNIA REPAIR N/A    Phreesia 02/21/2020   INCISIONAL HERNIA REPAIR N/A 10/15/2019   Procedure: HERNIA REPAIR INCISIONAL WITH MESH, TAR;  Surgeon: Ralene Ok, MD;  Location: [place];  Service: General;  Laterality: N/A;   INGUINAL HERNIA REPAIR Bilateral childhood & ~1995   LAPAROTOMY N/A 01/05/2019  Procedure: EXPLORATORY LAPAROTOMY graham patch repair;  Surgeon: Benjamine Sprague, DO;  Location: [facility];  Service: General;  Laterality: N/A;   LAPAROTOMY N/A 10/15/2019   Procedure: EXPLORATORY LAPAROTOMY;  Surgeon: Ralene Ok, MD;  Location: [place];  Service: General;  Laterality: N/A;   LYSIS OF ADHESION N/A 10/15/2019   Procedure: LYSIS OF ADHESION;  Surgeon: Ralene Ok, MD;  Location: [place];  Service: General;  Laterality: N/A;   NASAL SEPTUM SURGERY  2013   deviated septum   ORIF ANKLE FRACTURE Left 05/09/2018   Procedure: OPEN REDUCTION INTERNAL FIXATION LEFT LISFRANC FRACTURE/DISLOCATION;  Surgeon: Newt Minion, MD;  Location: [place];  Service:  Orthopedics;  Laterality: Left;   POLYPECTOMY  04/30/2019   Procedure: POLYPECTOMY;  Surgeon: Juanita Craver, MD;  Location: [facility];  Service: Endoscopy;;   TEE WITHOUT CARDIOVERSION N/A 06/29/2019   Procedure: TRANSESOPHAGEAL ECHOCARDIOGRAM (TEE);  Surgeon: Acie Fredrickson Wonda Cheng, MD;  [facility] END[facility]COPY;  Service: Cardiovascular;  Laterality: N/A;   US ECHOCARDIOGRAPHY  02/2009   WNL, EF >55% Claiborne Billings)   US RENAL/AORTA Right 05/2009   1-59% diameter reduction renal artery, rec rpt 2 yrs   VASECTOMY N/A    Phreesia 02/21/2020    Family History  Problem Relation Age of Onset   Cancer Father 67       colon   Prostate cancer Father    Diabetes Mother    Alcohol abuse Mother    Stroke Mother    Cancer Maternal Grandmother        pancreatic   Ataxia Brother    Ataxia Sister    CAD Neg Hx    Social History:  reports that he has never smoked. He has never used smokeless tobacco. He reports that he does not currently use alcohol. He reports that he does not use drugs.  Allergies:  Allergies  Allergen Reactions   Accupril [Quinapril Hcl] Cough   Nsaids     Perforated gastric ulcer    Medications Prior to Admission  Medication Sig Dispense Refill   acetaminophen (TYLENOL) 500 MG tablet Take 500 mg by mouth every 6 (six) hours as needed for mild pain.     amLODipine (NORVASC) 10 MG tablet Take 10 mg by mouth in the morning.     diclofenac Sodium (VOLTAREN) 1 % GEL Apply 1 application topically 4 (four) times daily as needed (knee pain).     gabapentin (NEURONTIN) 300 MG capsule Take 1 capsule (300 mg total) by mouth 3 (three) times daily. 270 capsule 1   hydrochlorothiazide (HYDRODIURIL) 12.5 MG tablet Take 1 tablet (12.5 mg total) by mouth daily. 90 tablet 2   losartan (COZAAR) 100 MG tablet TAKE 1 TABLET(100 MG) BY MOUTH DAILY 90 tablet 1   metoprolol succinate (TOPROL-XL) 50 MG 24 hr tablet TAKE 1 TABLET(50 MG) BY MOUTH DAILY WITH A MEAL 90 tablet 2   Multiple Vitamin (MULTIVITAMIN  WITH MINERALS) TABS tablet Take 1 tablet by mouth daily.     olopatadine (PATADAY) 0.1 % ophthalmic solution Place 1 drop into both eyes 2 (two) times daily as needed for allergies.     omeprazole (PRILOSEC) 20 MG capsule Take 20 mg by mouth daily.     albuterol (VENTOLIN HFA) 108 (90 Base) MCG/ACT inhaler Inhale 2 puffs into the lungs every 6 (six) hours as needed for wheezing or shortness of breath. (Patient not taking: No sig reported) 18 g 3   amLODipine (NORVASC) 5 MG tablet Take 1 tablet (5 mg total)  by mouth daily. (Patient not taking: No sig reported) 90 tablet 2   naltrexone (DEPADE) 50 MG tablet Take 1 tablet (50 mg total) by mouth daily. (Patient not taking: No sig reported) 90 tablet 2   traZODone (DESYREL) 50 MG tablet Take 1 tablet (50 mg total) by mouth at bedtime. (Patient not taking: No sig reported) 30 tablet 1    No results found for this or any previous visit (from the past 48 hour(s)). No results found.  Review of Systems  All other systems reviewed and are negative.  Blood pressure (!) 108/59, pulse (!) 58, temperature 98 F (36.7 C), temperature source Oral, resp. rate 18, height 5\' 11"  (1.803 m), weight 83.9 kg, SpO2 98 %. Physical Exam  Patient is alert, oriented, no adenopathy, well-dressed, normal affect, normal respiratory effort. Examination patient has a good dorsalis pedis pulse he has a great toe that is overlapping the second toe and the second toe with claw toe deformity beneath the great toe. Assessment/Plan 1. Pain in left foot   2. Bunion of great toe of left foot   3. Claw toe, acquired, left       Plan: Discussed with the patient with the clawing of the second toe on the long second metatarsal with overlapping of the great toe and second toe the recommended treatment would be to proceed with an Aiken osteotomy of the proximal phalanx of the great toe and a Weil osteotomy for the second metatarsal.  Possible PIP resection.  Risks and benefits were  discussed including recurrent deformity.   Newt Minion, MD 11/16/2020, 7:03 AM

## 2020-11-16 NOTE — Progress Notes (Signed)
Orthopedic Tech Progress Note Patient Details:  Louis [name] [birth year] [id number]  Ortho Devices Type of Ortho Device: Postop shoe/boot Ortho Device/Splint Location: lle Ortho Device/Splint Interventions: Ordered, Application, Adjustment, Removal   Post Interventions Patient Tolerated: Well Instructions Provided: Adjustment of device, Care of device  Charline Bills [name] 11/16/2020, 10:05 AM Pacu RN called and requested crutches and post op shoe.

## 2020-11-16 NOTE — Anesthesia Postprocedure Evaluation (Signed)
Anesthesia Post Note  Patient: Louis [name]  Procedure(s) Performed: WEIL OSTEOTOMY LEFT 2ND METATARSAL,  2ND TOE DISTAL INTERPHALANGEAL RESECTION AND [name] OSTEOTOMY WEIL OSTEOTOMY THIRD METATARSAL (Left)     Patient location during evaluation: PACU Anesthesia Type: Regional and MAC Level of consciousness: awake and alert Pain management: pain level controlled Vital Signs Assessment: post-procedure vital signs reviewed and stable Respiratory status: spontaneous breathing and respiratory function stable Cardiovascular status: stable Postop Assessment: no apparent nausea or vomiting Anesthetic complications: no   No notable events documented.  Last Vitals:  Vitals:   11/16/20 0941 11/16/20 1000  BP: (!) 100/55 123/67  Pulse: (!) 48 (!) 44  Resp: 11 11  Temp:  36.4 C  SpO2: 97% 99%    Last Pain:  Vitals:   11/16/20 1000  TempSrc:   PainSc: 0-No pain                 Merlinda Frederick

## 2020-11-16 NOTE — Anesthesia Procedure Notes (Signed)
Anesthesia Regional Block: Popliteal block   Pre-Anesthetic Checklist: , timeout performed,  Correct Patient, Correct Site, Correct Laterality,  Correct Procedure, Correct Position, site marked,  Risks and benefits discussed,  Surgical consent,  Pre-op evaluation,  At surgeon's request and post-op pain management  Laterality: Left  Prep: chloraprep       Needles:  Injection technique: Single-shot  Needle Type: Echogenic Stimulator Needle     Needle Length: 10cm  Needle Gauge: 20     Additional Needles:   Procedures:,,,, ultrasound used (permanent image in chart),,    Narrative:  Start time: 11/16/2020 8:00 AM End time: 11/16/2020 8:05 AM Injection made incrementally with aspirations every 5 mL.  Performed by: Personally  Anesthesiologist: Merlinda Frederick, MD  Additional Notes: A functioning IV was confirmed and monitors were applied.  Sterile prep and drape, hand hygiene and sterile gloves were used.  Negative aspiration and test dose prior to incremental administration of local anesthetic. The patient tolerated the procedure well.Ultrasound  guidance: relevant anatomy identified, needle position confirmed, local anesthetic spread visualized around nerve(s), vascular puncture avoided.  Image printed for medical record.

## 2020-11-16 NOTE — Telephone Encounter (Signed)
Called and lm on vm for pharm to advise ok to fill rx pt had surgery today.

## 2020-11-16 NOTE — Anesthesia Procedure Notes (Signed)
Procedure Name: MAC Date/Time: 11/16/2020 8:29 AM Performed by: Kyung Rudd, CRNA Pre-anesthesia Checklist: Patient identified, Emergency Drugs available, Suction available and Patient being monitored Patient Re-evaluated:Patient Re-evaluated prior to induction Oxygen Delivery Method: Simple face mask Induction Type: IV induction Placement Confirmation: positive ETCO2 Dental Injury: Teeth and Oropharynx as per pre-operative assessment

## 2020-11-17 ENCOUNTER — Ambulatory Visit (INDEPENDENT_AMBULATORY_CARE_PROVIDER_SITE_OTHER): Payer: BC Managed Care – PPO | Admitting: Orthopedic Surgery

## 2020-11-17 ENCOUNTER — Encounter (HOSPITAL_COMMUNITY): Payer: Self-pay | Admitting: Orthopedic Surgery

## 2020-11-17 ENCOUNTER — Other Ambulatory Visit: Payer: Self-pay

## 2020-11-17 DIAGNOSIS — M205X2 Other deformities of toe(s) (acquired), left foot: Secondary | ICD-10-CM

## 2020-11-22 ENCOUNTER — Other Ambulatory Visit: Payer: Self-pay

## 2020-11-22 ENCOUNTER — Ambulatory Visit (INDEPENDENT_AMBULATORY_CARE_PROVIDER_SITE_OTHER): Payer: BC Managed Care – PPO | Admitting: Orthopedic Surgery

## 2020-11-22 DIAGNOSIS — F329 Major depressive disorder, single episode, unspecified: Secondary | ICD-10-CM | POA: Diagnosis not present

## 2020-11-22 DIAGNOSIS — M21612 Bunion of left foot: Secondary | ICD-10-CM

## 2020-11-22 DIAGNOSIS — F1020 Alcohol dependence, uncomplicated: Secondary | ICD-10-CM | POA: Diagnosis not present

## 2020-11-22 DIAGNOSIS — G4700 Insomnia, unspecified: Secondary | ICD-10-CM | POA: Diagnosis not present

## 2020-11-22 DIAGNOSIS — F419 Anxiety disorder, unspecified: Secondary | ICD-10-CM | POA: Diagnosis not present

## 2020-11-22 DIAGNOSIS — M205X2 Other deformities of toe(s) (acquired), left foot: Secondary | ICD-10-CM

## 2020-11-25 ENCOUNTER — Other Ambulatory Visit: Payer: Self-pay | Admitting: Neurology

## 2020-11-25 ENCOUNTER — Other Ambulatory Visit: Payer: Self-pay | Admitting: Family Medicine

## 2020-11-25 DIAGNOSIS — G629 Polyneuropathy, unspecified: Secondary | ICD-10-CM

## 2020-11-25 DIAGNOSIS — E538 Deficiency of other specified B group vitamins: Secondary | ICD-10-CM

## 2020-11-29 ENCOUNTER — Encounter: Payer: Self-pay | Admitting: Orthopedic Surgery

## 2020-11-29 ENCOUNTER — Ambulatory Visit (INDEPENDENT_AMBULATORY_CARE_PROVIDER_SITE_OTHER): Payer: BC Managed Care – PPO | Admitting: Orthopedic Surgery

## 2020-11-29 ENCOUNTER — Other Ambulatory Visit: Payer: Self-pay

## 2020-11-29 DIAGNOSIS — M205X2 Other deformities of toe(s) (acquired), left foot: Secondary | ICD-10-CM

## 2020-11-29 DIAGNOSIS — M21612 Bunion of left foot: Secondary | ICD-10-CM

## 2020-11-29 NOTE — Progress Notes (Signed)
Office Visit Note   Patient: Louis [name]           Date of Birth: 22-Aug-1955           MRN: [id number] Visit Date: 11/22/2020              Requested by: Wendie Agreste, MD [address] [place],  [place] [address] PCP: Wendie Agreste, MD  Chief Complaint  Patient presents with   Left Foot - Routine Post Op      HPI: Patient is a 65 year old gentleman who presents in follow-up status post left forefoot surgery with Akin osteotomy of the great toe Weil osteotomy of the second toe and PIP resection of the second toe.  Weil osteotomy of the third metatarsal  Assessment & Plan: Visit Diagnoses:  1. Bunion of great toe of left foot   2. Claw toe, acquired, left     Plan: We will have him apply a compression stocking plan to follow-up in 1 week to harvest the sutures and pin.  Follow-Up Instructions: Return in about 1 week (around 11/29/2020).   Ortho Exam  Patient is alert, oriented, no adenopathy, well-dressed, normal affect, normal respiratory effort. Examination there is a good dorsalis pedis pulse the surgical incisions are healing well there is swelling no cellulitis  Imaging: No results found. No images are attached to the encounter.  Labs: Lab Results  Component Value Date   HGBA1C 5.4 07/14/2020   HGBA1C 5.7 (H) 06/26/2019   REPTSTATUS 07/04/2019 FINAL 07/01/2019   GRAMSTAIN NO WBC SEEN NO ORGANISMS SEEN  07/01/2019   CULT  07/01/2019    Consistent with normal respiratory flora. Performed at [facility], [place] [address], [place], [place] [address]    LABORGA ENTEROCOCCUS FAECALIS 06/25/2019     Lab Results  Component Value Date   ALBUMIN 4.6 07/14/2020   ALBUMIN 4.2 01/11/2020   ALBUMIN 3.7 10/08/2019   PREALBUMIN 5.5 (L) 06/30/2019    Lab Results  Component Value Date   MG 1.9 07/21/2019   MG 1.8 07/14/2019   MG 1.6 (L) 07/13/2019   Lab Results  Component Value Date   VD25OH 35.12 09/26/2015   VD25OH 67.1 10/11/2014    VD25OH 67.1 06/16/2013    Lab Results  Component Value Date   PREALBUMIN 5.5 (L) 06/30/2019   CBC EXTENDED Latest Ref Rng & Units 11/16/2020 01/11/2020 10/16/2019  WBC 4.0 - 10.5 K/uL 5.8 3.9 9.6  RBC 4.22 - 5.81 MIL/uL 4.24 4.45 3.86(L)  HGB 13.0 - 17.0 g/dL 12.8(L) 13.6 11.6(L)  HCT 39.0 - 52.0 % 39.2 40.3 35.3(L)  PLT 150 - 400 K/uL 304 292 362  NEUTROABS 1.4 - 7.0 x10E3/uL - 1.7 -  LYMPHSABS 0.7 - 3.1 x10E3/uL - 1.4 -     There is no height or weight on file to calculate BMI.  Orders:  No orders of the defined types were placed in this encounter.  No orders of the defined types were placed in this encounter.    Procedures: No procedures performed  Clinical Data: No additional findings.  ROS:  All other systems negative, except as noted in the HPI. Review of Systems  Objective: Vital Signs: There were no vitals taken for this visit.  Specialty Comments:  No specialty comments available.  PMFS History: Patient Active Problem List   Diagnosis Date Noted   Claw toe, acquired, left    Bunion of great toe of left foot  Skin lesion of face 06/14/2020   Unilateral primary osteoarthritis, right knee 01/06/2020   S/P hernia repair 10/15/2019   Neuropathy 07/20/2019   Macrocytic anemia    Debility 07/07/2019   Atelectasis    Enterococcal bacteremia 06/26/2019   Perforated viscus 01/06/2019   Onychomycosis 05/19/2018   Foot fracture, left, closed, initial encounter 05/09/2018   Lisfranc dislocation, left, initial encounter    Primary pulmonary hypertension ([place]) 54/00/8676   Diastolic dysfunction 19/50/9326   OSA (obstructive sleep apnea) 07/08/2017   SOB (shortness of breath) 07/02/2017   Chronic respiratory failure with hypoxia and hypercapnia ([place]) 07/02/2017   Cardiomegaly 07/02/2017   Exposure keratitis 02/17/2017   Pedal edema 12/18/2016   Transaminitis 12/18/2016   Bilateral pes planus 12/18/2016   Psoriasis-eczema overlap condition 07/30/2016    High serum high density lipoprotein (HDL) 09/26/2015   Epistaxis 09/26/2015   Renal artery stenosis in 1 of 2 vessels (HCC)    Essential tremor    Elevated PSA    GERD (gastroesophageal reflux disease)    Health maintenance examination 10/08/2014   Anxiety state 10/08/2014   Obesity, Class I, BMI 30-34.9    Mild intermittent asthma in adult without complication    Seasonal allergies    HTN (hypertension)    Scoliosis    Osteoporosis    Past Medical History:  Diagnosis Date   Alcohol use disorder, mild, abuse    Anemia    Anxiety    situational   Arthritis    Phreesia 02/21/2020   Asthma    Phreesia 02/21/2020   BPH (benign prostatic hyperplasia)    on flomax- Patien tand wife denies- and patient has never been on Flomax   Bundle branch block    per prior PCP records   CHF (congestive heart failure) ([place])    Chronic kidney disease    Acute Renal Failure- June 2021- admission- kidneys recovered quickly   Complication of anesthesia 03/11/2019   woke up before surgery was over- "paralytic"   COPD (chronic obstructive pulmonary disease) ([place])    Diverticulosis    by colonoscopy   Eczema    per prior PCP records   Elevated PSA 2015   peaked 6s, s/p benign biopsy 2014, sees urology [name]   Essential tremor    GERD (gastroesophageal reflux disease)    per prior PCP records   History of panic attacks    per prior PCP records   HTN (hypertension)    Hypertension    Phreesia 02/21/2020   Mild intermittent asthma in adult without complication    Obesity, Class I, BMI 30-34.9    Osteoporosis    DEXA 06/2013 with osteopenia - h/o ?wrist/hip fracture from AVN from chronic steroid use (asthma), took reclast for 1 year   Pneumonia    10/08/19 >10 years ago   Seasonal allergies    Sleep apnea    Substance abuse ([place])    Phreesia 02/21/2020   Thoracic scoliosis childhood   Transaminitis    per prior PCP records   Vitamin B 12 deficiency     Family History   Problem Relation Age of Onset   Cancer Father 16       colon   Prostate cancer Father    Diabetes Mother    Alcohol abuse Mother    Stroke Mother    Cancer Maternal Grandmother        pancreatic   Ataxia Brother    Ataxia Sister    CAD Neg Hx  Past Surgical History:  Procedure Laterality Date   APPLICATION OF WOUND VAC Left 05/09/2018   Procedure: Application Of Wound Vac;  Surgeon: Newt Minion, MD;  Location: [facility];  Service: Orthopedics;  Laterality: Left;   BIOPSY  04/30/2019   Procedure: BIOPSY;  Surgeon: Juanita Craver, MD;  Location: [facility];  Service: Endoscopy;;   COLONOSCOPY  12/2013   polyps, int hem, diverticulosis, rpt 5 yrs [name])   COLONOSCOPY WITH PROPOFOL N/A 04/30/2019   Procedure: COLONOSCOPY WITH PROPOFOL;  Surgeon: Juanita Craver, MD;  Location[facility];  Service: Endoscopy;  Laterality: N/A;   ELBOW SURGERY Right 2008   golfer's elbow   ESOPHAGOGASTRODUODENOSCOPY (EGD) WITH PROPOFOL N/A 04/30/2019   Procedure: ESOPHAGOGASTRODUODENOSCOPY (EGD) WITH PROPOFOL;  Surgeon: Juanita Craver, MD;  [facility];  Service: Endoscopy;  Laterality: N/A;   HERNIA REPAIR N/A    Phreesia 02/21/2020   INCISIONAL HERNIA REPAIR N/A 10/15/2019   Procedure: HERNIA REPAIR INCISIONAL WITH MESH, TAR;  Surgeon: Ralene Ok, MD;  Location: [place];  Service: General;  Laterality: N/A;   INGUINAL HERNIA REPAIR Bilateral childhood & ~1995   LAPAROTOMY N/A 01/05/2019   Procedure: EXPLORATORY LAPAROTOMY graham patch repair;  Surgeon: Benjamine Sprague, DO;  Location: [facility];  Service: General;  Laterality: N/A;   LAPAROTOMY N/A 10/15/2019   Procedure: EXPLORATORY LAPAROTOMY;  Surgeon: Ralene Ok, MD;  Location: [facility];  Service: General;  Laterality: N/A;   LYSIS OF ADHESION N/A 10/15/2019   Procedure: LYSIS OF ADHESION;  Surgeon: Ralene Ok, MD;  Location: [facility];  Service: General;  Laterality: N/A;   NASAL SEPTUM SURGERY  2013   deviated septum   ORIF ANKLE  FRACTURE Left 05/09/2018   Procedure: OPEN REDUCTION INTERNAL FIXATION LEFT LISFRANC FRACTURE/DISLOCATION;  Surgeon: Newt Minion, MD;  Location: [facility];  Service: Orthopedics;  Laterality: Left;   POLYPECTOMY  04/30/2019   Procedure: POLYPECTOMY;  Surgeon: Juanita Craver, MD;  [facility];  Service: Endoscopy;;   TEE WITHOUT CARDIOVERSION N/A 06/29/2019   Procedure: TRANSESOPHAGEAL ECHOCARDIOGRAM (TEE);  Surgeon: Acie Fredrickson Wonda Cheng, MD;  Location: [facility];  Service: Cardiovascular;  Laterality: N/A;   US ECHOCARDIOGRAPHY  02/2009   WNL, EF >55% Claiborne Billings)   US RENAL/AORTA Right 05/2009   1-59% diameter reduction renal artery, rec rpt 2 yrs   VASECTOMY N/A    Phreesia 02/21/2020   WEIL OSTEOTOMY Left 11/16/2020   Procedure: WEIL OSTEOTOMY LEFT 2ND METATARSAL,  2ND TOE DISTAL INTERPHALANGEAL RESECTION AND [name] OSTEOTOMY WEIL OSTEOTOMY THIRD METATARSAL;  Surgeon: Newt Minion, MD;  Location: [facility];  Service: Orthopedics;  Laterality: Left;   Social History   Occupational History   Not on file  Tobacco Use   Smoking status: Never   Smokeless tobacco: Never  Vaping Use   Vaping Use: Never used  Substance and Sexual Activity   Alcohol use: Not Currently   Drug use: No   Sexual activity: Yes    Birth control/protection: None

## 2020-11-30 ENCOUNTER — Encounter: Payer: Self-pay | Admitting: Orthopedic Surgery

## 2020-11-30 NOTE — Progress Notes (Signed)
Office Visit Note   Patient: Louis [name]           Date of Birth: [birth year]           MRN: [id number] Visit Date: 11/29/2020              Requested by: Wendie Agreste, MD [address] [place],  [place] [address] PCP: Wendie Agreste, MD  Chief Complaint  Patient presents with   Left Foot - Routine Post Op    11/16/20 left foot Weil osteotomy 2nd MT 2nd toe IP resection akein and weil osteotomy 3rd MT       HPI: Patient is a 65 year old gentleman who presents in follow-up for forefoot reconstruction on the left with an Aiken osteotomy proximal phalanx of the great toe and a Weil osteotomy for the second metatarsal and PIP resection of the second toe  Assessment & Plan: Visit Diagnoses:  1. Bunion of great toe of left foot   2. Claw toe, acquired, left     Plan: Stitches and pin were removed he will place a carbon plate and his Lawyer.  Follow-Up Instructions: Return in about 2 months (around 01/29/2021).   Ortho Exam  Patient is alert, oriented, no adenopathy, well-dressed, normal affect, normal respiratory effort. Examination the incisions are well-healed there is no cellulitis no drainage there is dry cracked skin from previous swelling.  After informed consent the pin was removed from the great toe.  Sutures were harvested.  Imaging: No results found. No images are attached to the encounter.  Labs: Lab Results  Component Value Date   HGBA1C 5.4 07/14/2020   HGBA1C 5.7 (H) 06/26/2019   REPTSTATUS 07/04/2019 FINAL 07/01/2019   GRAMSTAIN NO WBC SEEN NO ORGANISMS SEEN  07/01/2019   CULT  07/01/2019    Consistent with normal respiratory flora. Performed at [facility], [place] [address] [place], [place] [address]    LABORGA ENTEROCOCCUS FAECALIS 06/25/2019     Lab Results  Component Value Date   ALBUMIN 4.6 07/14/2020   ALBUMIN 4.2 01/11/2020   ALBUMIN 3.7 10/08/2019   PREALBUMIN 5.5 (L) 06/30/2019    Lab Results  Component  Value Date   MG 1.9 07/21/2019   MG 1.8 07/14/2019   MG 1.6 (L) 07/13/2019   Lab Results  Component Value Date   VD25OH 35.12 09/26/2015   VD25OH 67.1 10/11/2014   VD25OH 67.1 06/16/2013    Lab Results  Component Value Date   PREALBUMIN 5.5 (L) 06/30/2019   CBC EXTENDED Latest Ref Rng & Units 11/16/2020 01/11/2020 10/16/2019  WBC 4.0 - 10.5 K/uL 5.8 3.9 9.6  RBC 4.22 - 5.81 MIL/uL 4.24 4.45 3.86(L)  HGB 13.0 - 17.0 g/dL 12.8(L) 13.6 11.6(L)  HCT 39.0 - 52.0 % 39.2 40.3 35.3(L)  PLT 150 - 400 K/uL 304 292 362  NEUTROABS 1.4 - 7.0 x10E3/uL - 1.7 -  LYMPHSABS 0.7 - 3.1 x10E3/uL - 1.4 -     There is no height or weight on file to calculate BMI.  Orders:  No orders of the defined types were placed in this encounter.  No orders of the defined types were placed in this encounter.    Procedures: No procedures performed  Clinical Data: No additional findings.  ROS:  All other systems negative, except as noted in the HPI. Review of Systems  Objective: Vital Signs: There were no vitals taken for this visit.  Specialty Comments:  No specialty comments available.  PMFS History: Patient Active Problem List   Diagnosis Date Noted   Claw toe, acquired, left    Bunion of great toe of left foot    Skin lesion of face 06/14/2020   Unilateral primary osteoarthritis, right knee 01/06/2020   S/P hernia repair 10/15/2019   Neuropathy 07/20/2019   Macrocytic anemia    Debility 07/07/2019   Atelectasis    Enterococcal bacteremia 06/26/2019   Perforated viscus 01/06/2019   Onychomycosis 05/19/2018   Foot fracture, left, closed, initial encounter 05/09/2018   Lisfranc dislocation, left, initial encounter    Primary pulmonary hypertension ([place]) 09/98/3382   Diastolic dysfunction 50/53/9767   OSA (obstructive sleep apnea) 07/08/2017   SOB (shortness of breath) 07/02/2017   Chronic respiratory failure with hypoxia and hypercapnia ([place]) 07/02/2017   Cardiomegaly 07/02/2017    Exposure keratitis 02/17/2017   Pedal edema 12/18/2016   Transaminitis 12/18/2016   Bilateral pes planus 12/18/2016   Psoriasis-eczema overlap condition 07/30/2016   High serum high density lipoprotein (HDL) 09/26/2015   Epistaxis 09/26/2015   Renal artery stenosis in 1 of 2 vessels (HCC)    Essential tremor    Elevated PSA    GERD (gastroesophageal reflux disease)    Health maintenance examination 10/08/2014   Anxiety state 10/08/2014   Obesity, Class I, BMI 30-34.9    Mild intermittent asthma in adult without complication    Seasonal allergies    HTN (hypertension)    Scoliosis    Osteoporosis    Past Medical History:  Diagnosis Date   Alcohol use disorder, mild, abuse    Anemia    Anxiety    situational   Arthritis    Phreesia 02/21/2020   Asthma    Phreesia 02/21/2020   BPH (benign prostatic hyperplasia)    on flomax- Patien tand wife denies- and patient has never been on Flomax   Bundle branch block    per prior PCP records   CHF (congestive heart failure) ([place])    Chronic kidney disease    Acute Renal Failure- June 2021- admission- kidneys recovered quickly   Complication of anesthesia 03/11/2019   woke up before surgery was over- "paralytic"   COPD (chronic obstructive pulmonary disease) ([place])    Diverticulosis    by colonoscopy   Eczema    per prior PCP records   Elevated PSA 2015   peaked 6s, s/p benign biopsy 2014, sees urology [name]   Essential tremor    GERD (gastroesophageal reflux disease)    per prior PCP records   History of panic attacks    per prior PCP records   HTN (hypertension)    Hypertension    Phreesia 02/21/2020   Mild intermittent asthma in adult without complication    Obesity, Class I, BMI 30-34.9    Osteoporosis    DEXA 06/2013 with osteopenia - h/o ?wrist/hip fracture from AVN from chronic steroid use (asthma), took reclast for 1 year   Pneumonia    10/08/19 >10 years ago   Seasonal allergies    Sleep apnea     Substance abuse ([place])    Phreesia 02/21/2020   Thoracic scoliosis childhood   Transaminitis    per prior PCP records   Vitamin B 12 deficiency     Family History  Problem Relation Age of Onset   Cancer Father 71       colon   Prostate cancer Father    Diabetes Mother    Alcohol abuse Mother    Stroke Mother  Cancer Maternal Grandmother        pancreatic   Ataxia Brother    Ataxia Sister    CAD Neg Hx     Past Surgical History:  Procedure Laterality Date   APPLICATION OF WOUND VAC Left 05/09/2018   Procedure: Application Of Wound Vac;  Surgeon: Newt Minion, MD;  Location: [facility];  Service: Orthopedics;  Laterality: Left;   BIOPSY  04/30/2019   Procedure: BIOPSY;  Surgeon: Juanita Craver, MD;  [facility];  Service: Endoscopy;;   COLONOSCOPY  12/2013   polyps, int hem, diverticulosis, rpt 5 yrs [name])   COLONOSCOPY WITH PROPOFOL N/A 04/30/2019   Procedure: COLONOSCOPY WITH PROPOFOL;  Surgeon: Juanita Craver, MD;  [facility];  Service: Endoscopy;  Laterality: N/A;   ELBOW SURGERY Right 2008   golfer's elbow   ESOPHAGOGASTRODUODENOSCOPY (EGD) WITH PROPOFOL N/A 04/30/2019   Procedure: ESOPHAGOGASTRODUODENOSCOPY (EGD) WITH PROPOFOL;  Surgeon: Juanita Craver, MD;  [facility];  Service: Endoscopy;  Laterality: N/A;   HERNIA REPAIR N/A    Phreesia 02/21/2020   INCISIONAL HERNIA REPAIR N/A 10/15/2019   Procedure: HERNIA REPAIR INCISIONAL WITH MESH, TAR;  Surgeon: Ralene Ok, MD;  Location: [facility];  Service: General;  Laterality: N/A;   INGUINAL HERNIA REPAIR Bilateral childhood & ~1995   LAPAROTOMY N/A 01/05/2019   Procedure: EXPLORATORY LAPAROTOMY graham patch repair;  Surgeon: Benjamine Sprague, DO;  Location: [facility];  Service: General;  Laterality: N/A;   LAPAROTOMY N/A 10/15/2019   Procedure: EXPLORATORY LAPAROTOMY;  Surgeon: Ralene Ok, MD;  Location: [facility];  Service: General;  Laterality: N/A;   LYSIS OF ADHESION N/A 10/15/2019   Procedure:  LYSIS OF ADHESION;  Surgeon: Ralene Ok, MD;  Location: [facility];  Service: General;  Laterality: N/A;   NASAL SEPTUM SURGERY  2013   deviated septum   ORIF ANKLE FRACTURE Left 05/09/2018   Procedure: OPEN REDUCTION INTERNAL FIXATION LEFT LISFRANC FRACTURE/DISLOCATION;  Surgeon: Newt Minion, MD;  Location: [facility];  Service: Orthopedics;  Laterality: Left;   POLYPECTOMY  04/30/2019   Procedure: POLYPECTOMY;  Surgeon: Juanita Craver, MD;  [facility];  Service: Endoscopy;;   TEE WITHOUT CARDIOVERSION N/A 06/29/2019   Procedure: TRANSESOPHAGEAL ECHOCARDIOGRAM (TEE);  Surgeon: Acie Fredrickson Wonda Cheng, MD;  Location: [facility];  Service: Cardiovascular;  Laterality: N/A;   US ECHOCARDIOGRAPHY  02/2009   WNL, EF >55% Claiborne Billings)   US RENAL/AORTA Right 05/2009   1-59% diameter reduction renal artery, rec rpt 2 yrs   VASECTOMY N/A    Phreesia 02/21/2020   WEIL OSTEOTOMY Left 11/16/2020   Procedure: WEIL OSTEOTOMY LEFT 2ND METATARSAL,  2ND TOE DISTAL INTERPHALANGEAL RESECTION AND [name] OSTEOTOMY WEIL OSTEOTOMY THIRD METATARSAL;  Surgeon: Newt Minion, MD;  Location: [facility];  Service: Orthopedics;  Laterality: Left;   Social History   Occupational History   Not on file  Tobacco Use   Smoking status: Never   Smokeless tobacco: Never  Vaping Use   Vaping Use: Never used  Substance and Sexual Activity   Alcohol use: Not Currently   Drug use: No   Sexual activity: Yes    Birth control/protection: None

## 2020-12-01 ENCOUNTER — Encounter: Payer: Self-pay | Admitting: Pulmonary Disease

## 2020-12-01 ENCOUNTER — Other Ambulatory Visit: Payer: Self-pay

## 2020-12-01 ENCOUNTER — Ambulatory Visit (INDEPENDENT_AMBULATORY_CARE_PROVIDER_SITE_OTHER): Payer: BC Managed Care – PPO | Admitting: Pulmonary Disease

## 2020-12-01 DIAGNOSIS — R972 Elevated prostate specific antigen [PSA]: Secondary | ICD-10-CM | POA: Diagnosis not present

## 2020-12-01 DIAGNOSIS — J9611 Chronic respiratory failure with hypoxia: Secondary | ICD-10-CM | POA: Diagnosis not present

## 2020-12-01 DIAGNOSIS — J9612 Chronic respiratory failure with hypercapnia: Secondary | ICD-10-CM

## 2020-12-01 DIAGNOSIS — G4733 Obstructive sleep apnea (adult) (pediatric): Secondary | ICD-10-CM

## 2020-12-01 NOTE — Addendum Note (Signed)
Addended by: Fran Lowes on: 12/01/2020 04:01 PM   Modules accepted: Orders

## 2020-12-01 NOTE — Assessment & Plan Note (Addendum)
CPAP download was reviewed which shows excellent control of events on CPAP of 15 cm with residual AHI of 3/hour, great compliance more than 7 hours per night with large leak. Leak has persisted in spite of using of mask liners and fullface mask.  He has previously trialed AirFit F30 mask and this was not comfortable. Due to his significant weight loss, we will revisit with a home sleep test.  We discussed alternative options including hypoglossal nerve stimulation we will refer him to ENT to evaluate Meantime, we will change his auto CPAP settings to 12 to 15 cm  Weight loss encouraged, compliance with goal of at least 4-6 hrs every night is the expectation. Advised against medications with sedative side effects Cautioned against driving when sleepy - understanding that sleepiness will vary on a day to day basis

## 2020-12-01 NOTE — Patient Instructions (Signed)
   X  Check noct oximetry on CPAP/RA  X Change autoCPAP to 12-15 cm  X Schedule home sleep test  X ENT referral for inspire device

## 2020-12-01 NOTE — Assessment & Plan Note (Signed)
We will reassess need for oxygen by checking nocturnal oximetry on CPAP/room air

## 2020-12-01 NOTE — Progress Notes (Signed)
   Subjective:    [name], male    DOB: [birth year], 65 y.o.   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, pastor for FU of OSA & restrictive lung disease due to scoliosis    He was hospitalized with hypoxia & hypercarbia  In 07/2017 after trip to [place]   PMH - Had severe asthma as a child but did not bother him as an adult, PFTs have shown moderate airway obstruction , has scoliosis but no significant restriction with normal TLC -ventral hernia repair 10/2019`  -Alcoholism, underwent rehab sober since [facility] admission 06/2019  for enterococcal bacteremia , TEE negative-complicated by hypercarbic respiratory failure requiring mechanical ventilation for 1 day  63-month follow-up visit. He has done well, lost about 25 pounds.  Denies dyspnea, exercises daily. Is compliant with his CPAP machine and has O2 blended in. In the past due to residual events on his CPAP we have gradually increase the pressure to 15 cm, he wonders if he still needs the oxygen.        Significant tests/ events reviewed  ONO on CPAP/room air showed desaturation less than 88% for about 8 minutes   CT angiogram 07/2017 >> severe scoliosis, suggestion of pulmonary hypertension. Echo 08/1827 grade 2 diastolic dysfunction, normal LV function, decreased RV function but normal PA pressures EKG-right axis deviation 07/2017 ABG 7.31/72/71 on 4 L oxygen   08/2017 NPSG - 224 lbs - AHI 39/h, >> CPAP 9 cm + 2L o2   PFTs 08/2017 >> postbronchodilator ratio 71, FEV1 61% with FVC 65%, no significant broncho dilator response, TLC 90%, DLCO 96% suggestive extraparenchymal restriction  Review of Systems neg for any significant sore throat, dysphagia, itching, sneezing, nasal congestion or excess/ purulent secretions, fever, chills, sweats, unintended wt loss, pleuritic or exertional cp, hempoptysis, orthopnea pnd or change in chronic leg swelling. Also denies presyncope, palpitations, heartburn, abdominal  pain, nausea, vomiting, diarrhea or change in bowel or urinary habits, dysuria,hematuria, rash, arthralgias, visual complaints, headache, numbness weakness or ataxia.     Objective:   Physical Exam  Gen. Pleasant, obese, in no distress ENT - no lesions, no post nasal drip Neck: No JVD, no thyromegaly, no carotid bruits Lungs: no use of accessory muscles, no dullness to percussion, decreased without rales or rhonchi  Cardiovascular: Rhythm regular, heart sounds  normal, no murmurs or gallops, no peripheral edema Musculoskeletal: No deformities, no cyanosis or clubbing , no tremors       Assessment & Plan:

## 2020-12-02 NOTE — Addendum Note (Signed)
Addended by: Fran Lowes on: 12/02/2020 09:58 AM   Modules accepted: Orders

## 2020-12-04 DIAGNOSIS — J9601 Acute respiratory failure with hypoxia: Secondary | ICD-10-CM | POA: Diagnosis not present

## 2020-12-04 DIAGNOSIS — R062 Wheezing: Secondary | ICD-10-CM | POA: Diagnosis not present

## 2020-12-05 ENCOUNTER — Other Ambulatory Visit: Payer: Self-pay | Admitting: Orthopedic Surgery

## 2020-12-05 ENCOUNTER — Encounter: Payer: Self-pay | Admitting: Orthopedic Surgery

## 2020-12-05 MED ORDER — TRAMADOL HCL 50 MG PO TABS: 50.0000 mg | ORAL_TABLET | Freq: Four times a day (QID) | ORAL | 0 refills | Status: DC | PRN

## 2020-12-05 NOTE — Telephone Encounter (Signed)
Pt had a weil osteotomy of left foot 11/16/20 He is requesting tramadol instead of percocet. See below.

## 2020-12-05 NOTE — Progress Notes (Signed)
Office Visit Note   Patient: Louis [name]           Date of Birth: Apr 07, 1955           MRN: [id number] Visit Date: 11/17/2020              Requested by: Wendie Agreste, MD [address] [place],  [place] [address] PCP: Wendie Agreste, MD  Chief Complaint  Patient presents with   Left Foot - Routine Post Op, Wound Check    Weil osteotomy 11/16/20      HPI: Patient is a 65 year old gentleman who presents status post Akin osteotomy of proximal phalanx great toe Weil osteotomy second and third metatarsals and PIP resection of the second toe.  Assessment & Plan: Visit Diagnoses:  1. Claw toe, acquired, left     Plan: Dressing was changed follow-up in 1 week to remove the pins.  Follow-Up Instructions: Return in about 1 week (around 11/24/2020).   Ortho Exam  Patient is alert, oriented, no adenopathy, well-dressed, normal affect, normal respiratory effort. Examination the wounds are well approximated no cellulitis no odor no drainage no signs of infection.  Imaging: No results found. No images are attached to the encounter.  Labs: Lab Results  Component Value Date   HGBA1C 5.4 07/14/2020   HGBA1C 5.7 (H) 06/26/2019   REPTSTATUS 07/04/2019 FINAL 07/01/2019   GRAMSTAIN NO WBC SEEN NO ORGANISMS SEEN  07/01/2019   CULT  07/01/2019    Consistent with normal respiratory flora. Performed at [facility], [place] [address] [place], [place] [address]    LABORGA ENTEROCOCCUS FAECALIS 06/25/2019     Lab Results  Component Value Date   ALBUMIN 4.6 07/14/2020   ALBUMIN 4.2 01/11/2020   ALBUMIN 3.7 10/08/2019   PREALBUMIN 5.5 (L) 06/30/2019    Lab Results  Component Value Date   MG 1.9 07/21/2019   MG 1.8 07/14/2019   MG 1.6 (L) 07/13/2019   Lab Results  Component Value Date   VD25OH 35.12 09/26/2015   VD25OH 67.1 10/11/2014   VD25OH 67.1 06/16/2013    Lab Results  Component Value Date   PREALBUMIN 5.5 (L) 06/30/2019   CBC EXTENDED  Latest Ref Rng & Units 11/16/2020 01/11/2020 10/16/2019  WBC 4.0 - 10.5 K/uL 5.8 3.9 9.6  RBC 4.22 - 5.81 MIL/uL 4.24 4.45 3.86(L)  HGB 13.0 - 17.0 g/dL 12.8(L) 13.6 11.6(L)  HCT 39.0 - 52.0 % 39.2 40.3 35.3(L)  PLT 150 - 400 K/uL 304 292 362  NEUTROABS 1.4 - 7.0 x10E3/uL - 1.7 -  LYMPHSABS 0.7 - 3.1 x10E3/uL - 1.4 -     There is no height or weight on file to calculate BMI.  Orders:  No orders of the defined types were placed in this encounter.  No orders of the defined types were placed in this encounter.    Procedures: No procedures performed  Clinical Data: No additional findings.  ROS:  All other systems negative, except as noted in the HPI. Review of Systems  Objective: Vital Signs: There were no vitals taken for this visit.  Specialty Comments:  No specialty comments available.  PMFS History: Patient Active Problem List   Diagnosis Date Noted   Claw toe, acquired, left    Bunion of great toe of left foot    Skin lesion of face 06/14/2020   Unilateral primary osteoarthritis, right knee 01/06/2020   S/P hernia repair 10/15/2019   Neuropathy 07/20/2019   Macrocytic  anemia    Debility 07/07/2019   Atelectasis    Enterococcal bacteremia 06/26/2019   Perforated viscus 01/06/2019   Onychomycosis 05/19/2018   Foot fracture, left, closed, initial encounter 05/09/2018   Lisfranc dislocation, left, initial encounter    Primary pulmonary hypertension ([place]) 38/93/7342   Diastolic dysfunction 87/68/1157   OSA (obstructive sleep apnea) 07/08/2017   SOB (shortness of breath) 07/02/2017   Chronic respiratory failure with hypoxia and hypercapnia ([place]) 07/02/2017   Cardiomegaly 07/02/2017   Exposure keratitis 02/17/2017   Pedal edema 12/18/2016   Transaminitis 12/18/2016   Bilateral pes planus 12/18/2016   Psoriasis-eczema overlap condition 07/30/2016   High serum high density lipoprotein (HDL) 09/26/2015   Epistaxis 09/26/2015   Renal artery stenosis in 1 of 2  vessels (HCC)    Essential tremor    Elevated PSA    GERD (gastroesophageal reflux disease)    Health maintenance examination 10/08/2014   Anxiety state 10/08/2014   Obesity, Class I, BMI 30-34.9    Mild intermittent asthma in adult without complication    Seasonal allergies    HTN (hypertension)    Scoliosis    Osteoporosis    Past Medical History:  Diagnosis Date   Alcohol use disorder, mild, abuse    Anemia    Anxiety    situational   Arthritis    Phreesia 02/21/2020   Asthma    Phreesia 02/21/2020   BPH (benign prostatic hyperplasia)    on flomax- Patien tand wife denies- and patient has never been on Flomax   Bundle branch block    per prior PCP records   CHF (congestive heart failure) ([place])    Chronic kidney disease    Acute Renal Failure- June 2021- admission- kidneys recovered quickly   Complication of anesthesia 03/11/2019   woke up before surgery was over- "paralytic"   COPD (chronic obstructive pulmonary disease) ([place])    Diverticulosis    by colonoscopy   Eczema    per prior PCP records   Elevated PSA 2015   peaked 6s, s/p benign biopsy 2014, sees urology [name]   Essential tremor    GERD (gastroesophageal reflux disease)    per prior PCP records   History of panic attacks    per prior PCP records   HTN (hypertension)    Hypertension    Phreesia 02/21/2020   Mild intermittent asthma in adult without complication    Obesity, Class I, BMI 30-34.9    Osteoporosis    DEXA 06/2013 with osteopenia - h/o ?wrist/hip fracture from AVN from chronic steroid use (asthma), took reclast for 1 year   Pneumonia    10/08/19 >10 years ago   Seasonal allergies    Sleep apnea    Substance abuse ([place])    Phreesia 02/21/2020   Thoracic scoliosis childhood   Transaminitis    per prior PCP records   Vitamin B 12 deficiency     Family History  Problem Relation Age of Onset   Cancer Father 20       colon   Prostate cancer Father    Diabetes Mother     Alcohol abuse Mother    Stroke Mother    Cancer Maternal Grandmother        pancreatic   Ataxia Brother    Ataxia Sister    CAD Neg Hx     Past Surgical History:  Procedure Laterality Date   APPLICATION OF WOUND VAC Left 05/09/2018   Procedure: Application Of Wound Vac;  Surgeon: Newt Minion, MD;  Location: [facility];  Service: Orthopedics;  Laterality: Left;   BIOPSY  04/30/2019   Procedure: BIOPSY;  Surgeon: Juanita Craver, MD;  Location: [facility];  Service: Endoscopy;;   COLONOSCOPY  12/2013   polyps, int hem, diverticulosis, rpt 5 yrs [name])   COLONOSCOPY WITH PROPOFOL N/A 04/30/2019   Procedure: COLONOSCOPY WITH PROPOFOL;  Surgeon: Juanita Craver, MD;  Location: [facility];  Service: Endoscopy;  Laterality: N/A;   ELBOW SURGERY Right 2008   golfer's elbow   ESOPHAGOGASTRODUODENOSCOPY (EGD) WITH PROPOFOL N/A 04/30/2019   Procedure: ESOPHAGOGASTRODUODENOSCOPY (EGD) WITH PROPOFOL;  Surgeon: Juanita Craver, MD;  Location: [facility];  Service: Endoscopy;  Laterality: N/A;   HERNIA REPAIR N/A    Phreesia 02/21/2020   INCISIONAL HERNIA REPAIR N/A 10/15/2019   Procedure: HERNIA REPAIR INCISIONAL WITH MESH, TAR;  Surgeon: Ralene Ok, MD;  Location: [facility];  Service: General;  Laterality: N/A;   INGUINAL HERNIA REPAIR Bilateral childhood & ~1995   LAPAROTOMY N/A 01/05/2019   Procedure: EXPLORATORY LAPAROTOMY graham patch repair;  Surgeon: Benjamine Sprague, DO;  Location: [facility];  Service: General;  Laterality: N/A;   LAPAROTOMY N/A 10/15/2019   Procedure: EXPLORATORY LAPAROTOMY;  Surgeon: Ralene Ok, MD;  Location: [facility];  Service: General;  Laterality: N/A;   LYSIS OF ADHESION N/A 10/15/2019   Procedure: LYSIS OF ADHESION;  Surgeon: Ralene Ok, MD;  Location: [facility];  Service: General;  Laterality: N/A;   NASAL SEPTUM SURGERY  2013   deviated septum   ORIF ANKLE FRACTURE Left 05/09/2018   Procedure: OPEN REDUCTION INTERNAL FIXATION LEFT LISFRANC FRACTURE/DISLOCATION;  Surgeon:  Newt Minion, MD;  Location: [facility];  Service: Orthopedics;  Laterality: Left;   POLYPECTOMY  04/30/2019   Procedure: POLYPECTOMY;  Surgeon: Juanita Craver, MD;  Location: [facility];  Service: Endoscopy;;   TEE WITHOUT CARDIOVERSION N/A 06/29/2019   Procedure: TRANSESOPHAGEAL ECHOCARDIOGRAM (TEE);  Surgeon: Acie Fredrickson Wonda Cheng, MD;  Location: [facility];  Service: Cardiovascular;  Laterality: N/A;   US ECHOCARDIOGRAPHY  02/2009   WNL, EF >55% Claiborne Billings)   US RENAL/AORTA Right 05/2009   1-59% diameter reduction renal artery, rec rpt 2 yrs   VASECTOMY N/A    Phreesia 02/21/2020   WEIL OSTEOTOMY Left 11/16/2020   Procedure: WEIL OSTEOTOMY LEFT 2ND METATARSAL,  2ND TOE DISTAL INTERPHALANGEAL RESECTION AND [name] OSTEOTOMY WEIL OSTEOTOMY THIRD METATARSAL;  Surgeon: Newt Minion, MD;  Location: [facility];  Service: Orthopedics;  Laterality: Left;   Social History   Occupational History   Not on file  Tobacco Use   Smoking status: Never   Smokeless tobacco: Never  Vaping Use   Vaping Use: Never used  Substance and Sexual Activity   Alcohol use: Not Currently   Drug use: No   Sexual activity: Yes    Birth control/protection: None

## 2020-12-09 ENCOUNTER — Encounter: Payer: Self-pay | Admitting: Pulmonary Disease

## 2020-12-09 DIAGNOSIS — F1020 Alcohol dependence, uncomplicated: Secondary | ICD-10-CM | POA: Diagnosis not present

## 2020-12-09 DIAGNOSIS — R972 Elevated prostate specific antigen [PSA]: Secondary | ICD-10-CM | POA: Diagnosis not present

## 2020-12-09 DIAGNOSIS — N5201 Erectile dysfunction due to arterial insufficiency: Secondary | ICD-10-CM | POA: Diagnosis not present

## 2020-12-09 DIAGNOSIS — F329 Major depressive disorder, single episode, unspecified: Secondary | ICD-10-CM | POA: Diagnosis not present

## 2020-12-15 ENCOUNTER — Other Ambulatory Visit: Payer: Self-pay | Admitting: Family Medicine

## 2020-12-15 DIAGNOSIS — F1011 Alcohol abuse, in remission: Secondary | ICD-10-CM

## 2020-12-20 DIAGNOSIS — G4730 Sleep apnea, unspecified: Secondary | ICD-10-CM | POA: Diagnosis not present

## 2020-12-20 DIAGNOSIS — R0683 Snoring: Secondary | ICD-10-CM | POA: Diagnosis not present

## 2021-01-04 DIAGNOSIS — R062 Wheezing: Secondary | ICD-10-CM | POA: Diagnosis not present

## 2021-01-04 DIAGNOSIS — J9601 Acute respiratory failure with hypoxia: Secondary | ICD-10-CM | POA: Diagnosis not present

## 2021-01-05 NOTE — Telephone Encounter (Signed)
Will forward back to triage to locate ONO.

## 2021-01-09 NOTE — Telephone Encounter (Signed)
Can you ladies see the results? Please advise.

## 2021-01-10 NOTE — Telephone Encounter (Signed)
ONO order was sent to [facility] on 12/5.  PCC's do not get the results.  You will need to call [facility] if you don't see them in the chart. Will route to triage to make nurse aware.

## 2021-01-11 NOTE — Telephone Encounter (Signed)
Called and spoke with rep at [facility]. She will fax a copy of the results to our office. Will update the chart once I have received the fax.

## 2021-01-12 ENCOUNTER — Telehealth: Payer: Self-pay | Admitting: Acute Care

## 2021-01-12 NOTE — Telephone Encounter (Signed)
Called and spoke with patient. He verbalized understanding. He stated that he is already currently on 2L of O2. Per patient, the goal of the test was to see if he could D/C the O2 at night which he now understands that he can not due to the recent test results.   He wanted to make sure that RA and Louis [name] were both aware that he completed the ONO on room air.   I advised him that I would send a message over to both to let them know. He verbalized understanding.

## 2021-01-12 NOTE — Telephone Encounter (Signed)
I tried to call this patient and left a message to call back.

## 2021-01-12 NOTE — Telephone Encounter (Signed)
Please call patient and let him know he qualifies for night time oxygen. He spent > than 7 minutes with oxygen saturations below 88%. Please place order for oxygen 2 L at bedtime , and order through DME of patient's choice. Thanks so much

## 2021-01-19 ENCOUNTER — Encounter: Payer: BC Managed Care – PPO | Admitting: Family Medicine

## 2021-01-26 ENCOUNTER — Encounter: Payer: Self-pay | Admitting: Pulmonary Disease

## 2021-01-30 ENCOUNTER — Encounter: Payer: BC Managed Care – PPO | Admitting: Orthopedic Surgery

## 2021-01-30 NOTE — Telephone Encounter (Signed)
I have requested a copy of the ONO from [facility].

## 2021-01-30 NOTE — Telephone Encounter (Signed)
[name] or Judson Roch, pt is requesting one of you call him to review ONO results. Thank you

## 2021-01-31 ENCOUNTER — Other Ambulatory Visit: Payer: Self-pay

## 2021-01-31 ENCOUNTER — Ambulatory Visit (INDEPENDENT_AMBULATORY_CARE_PROVIDER_SITE_OTHER): Payer: BC Managed Care – PPO | Admitting: Orthopedic Surgery

## 2021-01-31 DIAGNOSIS — M205X2 Other deformities of toe(s) (acquired), left foot: Secondary | ICD-10-CM

## 2021-01-31 DIAGNOSIS — M21612 Bunion of left foot: Secondary | ICD-10-CM

## 2021-02-01 DIAGNOSIS — Z9989 Dependence on other enabling machines and devices: Secondary | ICD-10-CM | POA: Diagnosis not present

## 2021-02-01 DIAGNOSIS — Z6828 Body mass index (BMI) 28.0-28.9, adult: Secondary | ICD-10-CM | POA: Diagnosis not present

## 2021-02-01 DIAGNOSIS — G4733 Obstructive sleep apnea (adult) (pediatric): Secondary | ICD-10-CM | POA: Diagnosis not present

## 2021-02-01 DIAGNOSIS — J343 Hypertrophy of nasal turbinates: Secondary | ICD-10-CM | POA: Diagnosis not present

## 2021-02-02 ENCOUNTER — Ambulatory Visit (INDEPENDENT_AMBULATORY_CARE_PROVIDER_SITE_OTHER): Payer: BC Managed Care – PPO | Admitting: Family Medicine

## 2021-02-02 VITALS — BP 122/70 | HR 57 | Temp 98.3°F | Resp 17 | Ht 71.0 in | Wt 196.2 lb

## 2021-02-02 DIAGNOSIS — I10 Essential (primary) hypertension: Secondary | ICD-10-CM | POA: Diagnosis not present

## 2021-02-02 DIAGNOSIS — G629 Polyneuropathy, unspecified: Secondary | ICD-10-CM

## 2021-02-02 DIAGNOSIS — G4733 Obstructive sleep apnea (adult) (pediatric): Secondary | ICD-10-CM

## 2021-02-02 DIAGNOSIS — F1011 Alcohol abuse, in remission: Secondary | ICD-10-CM

## 2021-02-02 DIAGNOSIS — E871 Hypo-osmolality and hyponatremia: Secondary | ICD-10-CM

## 2021-02-02 MED ORDER — METOPROLOL SUCCINATE ER 50 MG PO TB24: ORAL_TABLET | ORAL | 2 refills | Status: DC

## 2021-02-02 MED ORDER — LOSARTAN POTASSIUM 100 MG PO TABS: ORAL_TABLET | ORAL | 2 refills | Status: DC

## 2021-02-02 MED ORDER — AMLODIPINE BESYLATE 10 MG PO TABS: ORAL_TABLET | ORAL | 2 refills | Status: DC

## 2021-02-02 MED ORDER — HYDROCHLOROTHIAZIDE 12.5 MG PO TABS: 12.5000 mg | ORAL_TABLET | Freq: Every day | ORAL | 2 refills | Status: DC

## 2021-02-02 NOTE — Patient Instructions (Addendum)
Glad to hear things are going well.  No change in meds at this time.  I will recheck your labs as borderline sodium last visit.  Let me know if any further refills needed prior to follow-up visit in 6 months.  Good luck with the further work-up at ENT for sleep apnea treatment.  Take care.

## 2021-02-02 NOTE — Progress Notes (Signed)
Subjective:  [name], male    DOB: [birth year]  Age: 66 y.o. MRN: [id number]  CC:  Chief Complaint  Patient [name] with   Hypertension    Pt here [name] recheck and refills on medication reports no concerns     HPI Louis [name]   Hypertension: Losartan 100 mg daily, HCTZ 12.5 mg daily, amlodipine 10 mg daily, Toprol 50 mg daily. Prior cardiologist eval - Dr. Claiborne Billings, no recent visit. Appt 07/2019. Grade 2 diastolic dysfxn.  History of OSA on CPAP.  ENT eval yesterday to discuss hypoglossal nerve stimulator placement as difficulty tolerating CPAP.  Possible repeat sleep study arranged, followed by possible drug-induced sleep endoscopy to evaluate pattern of airway collapse. Borderline sodium of 134 on November 16 No recent leg swelling.  S/p fellowship hall treatment, Jan 2022. 1 year 15 days sober. Ongoing counseling at [employer] and AA mtg 5 days per week. Counseling helpful [name] life improvement.   Home readings: BP Readings from Last 3 Encounters:  02/02/21 122/70  12/01/20 114/62  11/16/20 123/67   Lab Results  Component Value Date   CREATININE 0.98 11/16/2020   Peripheral neuropathy Treated with gabapentin 300 mg twice daily.  Once per day with option of second time per day when discussed in November.  Up to 3 times per day without any side effects.  Followed by orthopedic/foot specialist, surgery in November.  History of B12 deficiency.  Option of 3 times daily dosing of gabapentin discussed at his November visit.  B12 level normal at 629 on November 11. Foot doing well after surgery.  Usually BID dosing gabapentin, 3rd dose with exercise at times with tylenol.     History Patient Active Problem List   Diagnosis Date Noted   Claw toe, acquired, left    Bunion of great toe of left foot    Skin lesion of face 06/14/2020   Unilateral primary osteoarthritis, right knee 01/06/2020   S/P hernia repair 10/15/2019   Neuropathy  07/20/2019   Macrocytic anemia    Debility 07/07/2019   Atelectasis    Enterococcal bacteremia 06/26/2019   Perforated viscus 01/06/2019   Onychomycosis 05/19/2018   Foot fracture, left, closed, initial encounter 05/09/2018   Lisfranc dislocation, left, initial encounter    Primary pulmonary hypertension ([place]) 62/56/3893   Diastolic dysfunction 73/42/8768   OSA (obstructive sleep apnea) 07/08/2017   SOB (shortness of breath) 07/02/2017   Chronic respiratory failure with hypoxia and hypercapnia ([place]) 07/02/2017   Cardiomegaly 07/02/2017   Exposure keratitis 02/17/2017   Pedal edema 12/18/2016   Transaminitis 12/18/2016   Bilateral pes planus 12/18/2016   Psoriasis-eczema overlap condition 07/30/2016   High serum high density lipoprotein (HDL) 09/26/2015   Epistaxis 09/26/2015   Renal artery stenosis in 1 of 2 vessels (HCC)    Essential tremor    Elevated PSA    GERD (gastroesophageal reflux disease)    Health maintenance examination 10/08/2014   Anxiety state 10/08/2014   Obesity, Class I, BMI 30-34.9    Mild intermittent asthma in adult without complication    Seasonal allergies    HTN (hypertension)    Scoliosis    Osteoporosis    Past Medical History:  Diagnosis Date   Alcohol use disorder, mild, abuse    Anemia    Anxiety    situational   Arthritis    Phreesia 02/21/2020   Asthma    Phreesia 02/21/2020   BPH (benign prostatic hyperplasia)  on flomax- Patien tand wife denies- and patient has never been on Flomax   Bundle branch block    per prior PCP records   CHF (congestive heart failure) ([place])    Chronic kidney disease    Acute Renal Failure- June 2021- admission- kidneys recovered quickly   Complication of anesthesia 03/11/2019   woke up before surgery was over- "paralytic"   COPD (chronic obstructive pulmonary disease) ([place])    Diverticulosis    by colonoscopy   Eczema    per prior PCP records   Elevated PSA 2015   peaked 6s, s/p benign biopsy  2014, sees urology [name]   Essential tremor    GERD (gastroesophageal reflux disease)    per prior PCP records   History of panic attacks    per prior PCP records   HTN (hypertension)    Hypertension    Phreesia 02/21/2020   Mild intermittent asthma in adult without complication    Obesity, Class I, BMI 30-34.9    Osteoporosis    DEXA 06/2013 with osteopenia - h/o ?wrist/hip fracture from AVN from chronic steroid use (asthma), took reclast [name] 1 year   Pneumonia    10/08/19 >10 years ago   Seasonal allergies    Sleep apnea    Substance abuse ([place])    Phreesia 02/21/2020   Thoracic scoliosis childhood   Transaminitis    per prior PCP records   Vitamin B 12 deficiency    Past Surgical History:  Procedure Laterality Date   APPLICATION OF WOUND VAC Left 05/09/2018   Procedure: Application Of Wound Vac;  Surgeon: Newt Minion, MD;  Location: [place];  Service: Orthopedics;  Laterality: Left;   BIOPSY  04/30/2019   Procedure: BIOPSY;  Surgeon: Juanita Craver, MD;  [facility];  Service: Endoscopy;;   COLONOSCOPY  12/2013   polyps, int hem, diverticulosis, rpt 5 yrs [name])   COLONOSCOPY WITH PROPOFOL N/A 04/30/2019   Procedure: COLONOSCOPY WITH PROPOFOL;  Surgeon: Juanita Craver, MD;  [facility];  Service: Endoscopy;  Laterality: N/A;   ELBOW SURGERY Right 2008   golfer's elbow   ESOPHAGOGASTRODUODENOSCOPY (EGD) WITH PROPOFOL N/A 04/30/2019   Procedure: ESOPHAGOGASTRODUODENOSCOPY (EGD) WITH PROPOFOL;  Surgeon: Juanita Craver, MD;  [facility];  Service: Endoscopy;  Laterality: N/A;   HERNIA REPAIR N/A    Phreesia 02/21/2020   INCISIONAL HERNIA REPAIR N/A 10/15/2019   Procedure: HERNIA REPAIR INCISIONAL WITH MESH, TAR;  Surgeon: Ralene Ok, MD;  Location: [place];  Service: General;  Laterality: N/A;   INGUINAL HERNIA REPAIR Bilateral childhood & ~1995   LAPAROTOMY N/A 01/05/2019   Procedure: EXPLORATORY LAPAROTOMY graham patch repair;   Surgeon: Benjamine Sprague, DO;  Location: [facility];  Service: General;  Laterality: N/A;   LAPAROTOMY N/A 10/15/2019   Procedure: EXPLORATORY LAPAROTOMY;  Surgeon: Ralene Ok, MD;  Location: [place];  Service: General;  Laterality: N/A;   LYSIS OF ADHESION N/A 10/15/2019   Procedure: LYSIS OF ADHESION;  Surgeon: Ralene Ok, MD;  Location: [place];  Service: General;  Laterality: N/A;   NASAL SEPTUM SURGERY  2013   deviated septum   ORIF ANKLE FRACTURE Left 05/09/2018   Procedure: OPEN REDUCTION INTERNAL FIXATION LEFT LISFRANC FRACTURE/DISLOCATION;  Surgeon: Newt Minion, MD;  Location: [place];  Service: Orthopedics;  Laterality: Left;   POLYPECTOMY  04/30/2019   Procedure: POLYPECTOMY;  Surgeon: Juanita Craver, MD;  [facility];  Service: Endoscopy;;   TEE WITHOUT CARDIOVERSION N/A 06/29/2019  Procedure: TRANSESOPHAGEAL ECHOCARDIOGRAM (TEE);  Surgeon: Acie Fredrickson Wonda Cheng, MD;  Location: [facility];  Service: Cardiovascular;  Laterality: N/A;   US ECHOCARDIOGRAPHY  02-24-09   WNL, EF >55% Claiborne Billings)   US RENAL/AORTA Right 05/2009   1-59% diameter reduction renal artery, rec rpt 2 yrs   VASECTOMY N/A    Phreesia 02/21/2020   WEIL OSTEOTOMY Left 11/16/2020   Procedure: WEIL OSTEOTOMY LEFT 2ND METATARSAL,  2ND TOE DISTAL INTERPHALANGEAL RESECTION AND [name] OSTEOTOMY WEIL OSTEOTOMY THIRD METATARSAL;  Surgeon: Newt Minion, MD;  Location: [place];  Service: Orthopedics;  Laterality: Left;   Allergies  Allergen Reactions   Accupril [Quinapril Hcl] Cough   Nsaids     Perforated gastric ulcer   Prior to Admission medications   Medication Sig Start Date End Date Taking? Authorizing Provider  acetaminophen (TYLENOL) 500 MG tablet Take 500 mg by mouth every 6 (six) hours as needed [name] mild pain.   Yes [provider]  amLODipine (NORVASC) 10 MG tablet TAKE 1 TABLET(10 MG) BY MOUTH DAILY 11/25/20  Yes Wendie Agreste, MD  diclofenac Sodium (VOLTAREN) 1 % GEL Apply 1 application  topically 4 (four) times daily as needed (knee pain).   Yes [provider]  gabapentin (NEURONTIN) 300 MG capsule Take 1 capsule (300 mg total) by mouth 3 (three) times daily. 11/10/20  Yes Wendie Agreste, MD  hydrochlorothiazide (HYDRODIURIL) 12.5 MG tablet Take 1 tablet (12.5 mg total) by mouth daily. 07/14/20  Yes Wendie Agreste, MD  losartan (COZAAR) 100 MG tablet TAKE 1 TABLET(100 MG) BY MOUTH DAILY 08/16/20  Yes Troy Sine, MD  metoprolol succinate (TOPROL-XL) 50 MG 24 hr tablet TAKE 1 TABLET(50 MG) BY MOUTH DAILY WITH A MEAL 07/14/20  Yes Wendie Agreste, MD  Multiple Vitamin (MULTIVITAMIN WITH MINERALS) TABS tablet Take 1 tablet by mouth daily. 07/08/19  Yes Dahal, Marlowe Aschoff, MD  naltrexone (DEPADE) 50 MG tablet TAKE 1 TABLET(50 MG) BY MOUTH DAILY 12/15/20  Yes Wendie Agreste, MD  olopatadine (PATANOL) 0.1 % ophthalmic solution Place 1 drop into both eyes 2 (two) times daily as needed [name] allergies.   Yes [provider]  omeprazole (PRILOSEC) 20 MG capsule Take 20 mg by mouth daily.   Yes [provider]  oxyCODONE-acetaminophen (PERCOCET/ROXICET) 5-325 MG tablet Take 1 tablet by mouth every 4 (four) hours as needed. 11/16/20  Yes Newt Minion, MD  traMADol (ULTRAM) 50 MG tablet Take 1 tablet (50 mg total) by mouth every 6 (six) hours as needed [name] moderate pain. 12/05/20  Yes Newt Minion, MD   Social History   Socioeconomic History   Marital status: Married    Spouse name: Not on file   Number of children: Not on file   Years of education: Not on file   Highest education level: Not on file  Occupational History   Not on file  Tobacco Use   Smoking status: Never   Smokeless tobacco: Never  Vaping Use   Vaping Use: Never used  Substance and Sexual Activity   Alcohol use: Not Currently   Drug use: No   Sexual activity: Yes    Birth control/protection: None  Other Topics Concern   Not on file  Social History Narrative   Lives with wife  (retired Materials engineer), 52yo dog died 2014/02/24   Grown children   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, was Chief Executive Officer   Edu: JD, masters in divinity    Activity: TRX and cardio and pilates  Diet: good water, fruits/vegetables daily   Social Determinants of Health   Financial Resource Strain: Not on file  Food Insecurity: Not on file  Transportation Needs: Not on file  Physical Activity: Not on file  Stress: Not on file  Social Connections: Not on file  Intimate Partner Violence: Not on file    Review of Systems  Constitutional:  Negative [name] fatigue and unexpected weight change.  Eyes:  Negative [name] visual disturbance.  Respiratory:  Negative [name] cough, chest tightness and shortness of breath.   Cardiovascular:  Negative [name] chest pain, palpitations and leg swelling.  Gastrointestinal:  Negative [name] abdominal pain and blood in stool.  Neurological:  Negative [name] dizziness, light-headedness and headaches.    Objective:   Vitals:   02/02/21 1459  BP: 122/70  Pulse: (!) 57  Resp: 17  Temp: 98.3 F (36.8 C)  TempSrc: Temporal  SpO2: 98%  Weight: 196 lb 3.2 oz (89 kg)  Height: _0  (1.803 m)     Physical Exam Vitals reviewed.  Constitutional:      Appearance: He is well-developed.  HENT:     Head: Normocephalic and atraumatic.  Neck:     Vascular: No carotid bruit or JVD.  Cardiovascular:     Rate and Rhythm: Normal rate and regular rhythm.     Heart sounds: Normal heart sounds. No murmur heard. Pulmonary:     Effort: Pulmonary effort is normal.     Breath sounds: Normal breath sounds. No rales.  Musculoskeletal:     Right lower leg: No edema.     Left lower leg: No edema.  Skin:    General: Skin is warm and dry.  Neurological:     Mental Status: He is alert and oriented to person, place, and time.  Psychiatric:        Mood and Affect: Mood normal.       Assessment & Plan:  Louis [name] is a 66 y.o. male . Essential hypertension - Plan: hydrochlorothiazide  (HYDRODIURIL) 12.5 MG tablet, amLODipine (NORVASC) 10 MG tablet, metoprolol succinate (TOPROL-XL) 50 MG 24 hr tablet, Basic metabolic panel, losartan (COZAAR) 100 MG tablet  -Stable on current regimen, continue same.  Check labs  Neuropathy  -Stable with current gabapentin dosing, twice daily with option of 3 times daily dosing.  Okay to refill when due.  Hyponatremia - Plan: Basic metabolic panel  -Borderline on most recent labs, repeat BMP.  Alcohol abuse, in remission  -Doing well, continues counseling, AA.  OSA (obstructive sleep apnea)  -As above has met with ENT to look into hypoglossal nerve stimulation treatment.  Meds ordered this encounter  Medications   hydrochlorothiazide (HYDRODIURIL) 12.5 MG tablet    Sig: Take 1 tablet (12.5 mg total) by mouth daily.    Dispense:  90 tablet    Refill:  2   amLODipine (NORVASC) 10 MG tablet    Sig: TAKE 1 TABLET(10 MG) BY MOUTH DAILY    Dispense:  90 tablet    Refill:  2   metoprolol succinate (TOPROL-XL) 50 MG 24 hr tablet    Sig: TAKE 1 TABLET(50 MG) BY MOUTH DAILY WITH A MEAL    Dispense:  90 tablet    Refill:  2    **Patient requests 90 days supply**   losartan (COZAAR) 100 MG tablet    Sig: TAKE 1 TABLET(100 MG) BY MOUTH DAILY    Dispense:  90 tablet    Refill:  2    **Patient requests  90 days supply**   Patient Instructions  Glad to hear things are going well.  No change in meds at this time.  I will recheck your labs as borderline sodium last visit.  Let me know if any further refills needed prior to follow-up visit in 6 months.  Good luck with the further work-up at ENT [name] sleep apnea treatment.  Take care.    Signed,   Merri Ray, MD [place], [facility] 02/02/21 6:51 PM

## 2021-02-03 LAB — BASIC METABOLIC PANEL
BUN: 23 mg/dL (ref 6–23)
CO2: 29 mEq/L (ref 19–32)
Calcium: 10 mg/dL (ref 8.4–10.5)
Chloride: 96 mEq/L (ref 96–112)
Creatinine, Ser: 0.99 mg/dL (ref 0.40–1.50)
GFR: 81.01 mL/min (ref >60.00)
Glucose, Bld: 88 mg/dL (ref 70–99)
Potassium: 4.1 mEq/L (ref 3.5–5.1)
Sodium: 132 mEq/L — ABNORMAL LOW (ref 135–145)

## 2021-02-04 DIAGNOSIS — J9601 Acute respiratory failure with hypoxia: Secondary | ICD-10-CM | POA: Diagnosis not present

## 2021-02-05 ENCOUNTER — Encounter: Payer: Self-pay | Admitting: Orthopedic Surgery

## 2021-02-05 NOTE — Progress Notes (Signed)
Office Visit Note   Patient: Louis [name]           Date of Birth: 10/14/1955           MRN: [id number] Visit Date: 01/31/2021              Requested by: Wendie Agreste, MD [address] [place],  [place] [address] PCP: Wendie Agreste, MD  Chief Complaint  Patient presents with   Left Foot - Routine Post Op    11/16/20 weil osteotomy 2nd and 3rd MT and 2nd toe IP resection      HPI: Patient is a 66 year old gentleman who presents in follow-up status post forefoot reconstruction with Weil osteotomy of the second and third metatarsals second toe PIP resection.  Patient states he has pain beneath the metatarsal heads.  Assessment & Plan: Visit Diagnoses:  1. Bunion of great toe of left foot   2. Claw toe, acquired, left     Plan: Recommended Achilles stretching recommended a carbon plate under the orthotic continue working on Achilles stretching  Follow-Up Instructions: Return if symptoms worsen or fail to improve.   Ortho Exam  Patient is alert, oriented, no adenopathy, well-dressed, normal affect, normal respiratory effort. Examination patient has a pronated valgus foot with fat pad atrophy and distal migration of the fat pad on the metatarsal heads these toes show good alignment.  Imaging: No results found. No images are attached to the encounter.  Labs: Lab Results  Component Value Date   HGBA1C 5.4 07/14/2020   HGBA1C 5.7 (H) 06/26/2019   REPTSTATUS 07/04/2019 FINAL 07/01/2019   GRAMSTAIN NO WBC SEEN NO ORGANISMS SEEN  07/01/2019   CULT  07/01/2019    Consistent with normal respiratory flora. Performed at [facility], [place] [address], [place], [place] [address]    LABORGA ENTEROCOCCUS FAECALIS 06/25/2019     Lab Results  Component Value Date   ALBUMIN 4.6 07/14/2020   ALBUMIN 4.2 01/11/2020   ALBUMIN 3.7 10/08/2019   PREALBUMIN 5.5 (L) 06/30/2019    Lab Results  Component Value Date   MG 1.9 07/21/2019   MG 1.8 07/14/2019    MG 1.6 (L) 07/13/2019   Lab Results  Component Value Date   VD25OH 35.12 09/26/2015   VD25OH 67.1 10/11/2014   VD25OH 67.1 06/16/2013    Lab Results  Component Value Date   PREALBUMIN 5.5 (L) 06/30/2019   CBC EXTENDED Latest Ref Rng & Units 11/16/2020 01/11/2020 10/16/2019  WBC 4.0 - 10.5 K/uL 5.8 3.9 9.6  RBC 4.22 - 5.81 MIL/uL 4.24 4.45 3.86(L)  HGB 13.0 - 17.0 g/dL 12.8(L) 13.6 11.6(L)  HCT 39.0 - 52.0 % 39.2 40.3 35.3(L)  PLT 150 - 400 K/uL 304 292 362  NEUTROABS 1.4 - 7.0 x10E3/uL - 1.7 -  LYMPHSABS 0.7 - 3.1 x10E3/uL - 1.4 -     There is no height or weight on file to calculate BMI.  Orders:  No orders of the defined types were placed in this encounter.  No orders of the defined types were placed in this encounter.    Procedures: No procedures performed  Clinical Data: No additional findings.  ROS:  All other systems negative, except as noted in the HPI. Review of Systems  Objective: Vital Signs: There were no vitals taken for this visit.  Specialty Comments:  No specialty comments available.  PMFS History: Patient Active Problem List   Diagnosis Date Noted   Claw toe, acquired,  left    Bunion of great toe of left foot    Skin lesion of face 06/14/2020   Unilateral primary osteoarthritis, right knee 01/06/2020   S/P hernia repair 10/15/2019   Neuropathy 07/20/2019   Macrocytic anemia    Debility 07/07/2019   Atelectasis    Enterococcal bacteremia 06/26/2019   Perforated viscus 01/06/2019   Onychomycosis 05/19/2018   Foot fracture, left, closed, initial encounter 05/09/2018   Lisfranc dislocation, left, initial encounter    Primary pulmonary hypertension ([place]) 72/09/4707   Diastolic dysfunction 62/83/6629   OSA (obstructive sleep apnea) 07/08/2017   SOB (shortness of breath) 07/02/2017   Chronic respiratory failure with hypoxia and hypercapnia ([place]) 07/02/2017   Cardiomegaly 07/02/2017   Exposure keratitis 02/17/2017   Pedal edema  12/18/2016   Transaminitis 12/18/2016   Bilateral pes planus 12/18/2016   Psoriasis-eczema overlap condition 07/30/2016   High serum high density lipoprotein (HDL) 09/26/2015   Epistaxis 09/26/2015   Renal artery stenosis in 1 of 2 vessels (HCC)    Essential tremor    Elevated PSA    GERD (gastroesophageal reflux disease)    Health maintenance examination 10/08/2014   Anxiety state 10/08/2014   Obesity, Class I, BMI 30-34.9    Mild intermittent asthma in adult without complication    Seasonal allergies    HTN (hypertension)    Scoliosis    Osteoporosis    Past Medical History:  Diagnosis Date   Alcohol use disorder, mild, abuse    Anemia    Anxiety    situational   Arthritis    Phreesia 02/21/2020   Asthma    Phreesia 02/21/2020   BPH (benign prostatic hyperplasia)    on flomax- Patien tand wife denies- and patient has never been on Flomax   Bundle branch block    per prior PCP records   CHF (congestive heart failure) ([place])    Chronic kidney disease    Acute Renal Failure- June 2021- admission- kidneys recovered quickly   Complication of anesthesia 03/11/2019   woke up before surgery was over- "paralytic"   COPD (chronic obstructive pulmonary disease) ([place])    Diverticulosis    by colonoscopy   Eczema    per prior PCP records   Elevated PSA 2015   peaked 6s, s/p benign biopsy 2014, sees urology [name]   Essential tremor    GERD (gastroesophageal reflux disease)    per prior PCP records   History of panic attacks    per prior PCP records   HTN (hypertension)    Hypertension    Phreesia 02/21/2020   Mild intermittent asthma in adult without complication    Obesity, Class I, BMI 30-34.9    Osteoporosis    DEXA 06/2013 with osteopenia - h/o ?wrist/hip fracture from AVN from chronic steroid use (asthma), took reclast for 1 year   Pneumonia    10/08/19 >10 years ago   Seasonal allergies    Sleep apnea    Substance abuse ([place])    Phreesia 02/21/2020    Thoracic scoliosis childhood   Transaminitis    per prior PCP records   Vitamin B 12 deficiency     Family History  Problem Relation Age of Onset   Cancer Father 2       colon   Prostate cancer Father    Diabetes Mother    Alcohol abuse Mother    Stroke Mother    Cancer Maternal Grandmother        pancreatic  Ataxia Brother    Ataxia Sister    CAD Neg Hx     Past Surgical History:  Procedure Laterality Date   APPLICATION OF WOUND VAC Left 05/09/2018   Procedure: Application Of Wound Vac;  Surgeon: Newt Minion, MD;  Location: [place];  Service: Orthopedics;  Laterality: Left;   BIOPSY  04/30/2019   Procedure: BIOPSY;  Surgeon: Juanita Craver, MD;  [facility];  Service: Endoscopy;;   COLONOSCOPY  12/2013   polyps, int hem, diverticulosis, rpt 5 yrs [name])   COLONOSCOPY WITH PROPOFOL N/A 04/30/2019   Procedure: COLONOSCOPY WITH PROPOFOL;  Surgeon: Juanita Craver, MD;  [facility];  Service: Endoscopy;  Laterality: N/A;   ELBOW SURGERY Right 2008   golfer's elbow   ESOPHAGOGASTRODUODENOSCOPY (EGD) WITH PROPOFOL N/A 04/30/2019   Procedure: ESOPHAGOGASTRODUODENOSCOPY (EGD) WITH PROPOFOL;  Surgeon: Juanita Craver, MD;  [facility];  Service: Endoscopy;  Laterality: N/A;   HERNIA REPAIR N/A    Phreesia 02/21/2020   INCISIONAL HERNIA REPAIR N/A 10/15/2019   Procedure: HERNIA REPAIR INCISIONAL WITH MESH, TAR;  Surgeon: Ralene Ok, MD;  Location: [place];  Service: General;  Laterality: N/A;   INGUINAL HERNIA REPAIR Bilateral childhood & ~1995   LAPAROTOMY N/A 01/05/2019   Procedure: EXPLORATORY LAPAROTOMY graham patch repair;  Surgeon: Benjamine Sprague, DO;  Location: [facility];  Service: General;  Laterality: N/A;   LAPAROTOMY N/A 10/15/2019   Procedure: EXPLORATORY LAPAROTOMY;  Surgeon: Ralene Ok, MD;  Location: [place];  Service: General;  Laterality: N/A;   LYSIS OF ADHESION N/A 10/15/2019   Procedure: LYSIS OF ADHESION;  Surgeon: Ralene Ok,  MD;  Location: [place];  Service: General;  Laterality: N/A;   NASAL SEPTUM SURGERY  2013   deviated septum   ORIF ANKLE FRACTURE Left 05/09/2018   Procedure: OPEN REDUCTION INTERNAL FIXATION LEFT LISFRANC FRACTURE/DISLOCATION;  Surgeon: Newt Minion, MD;  Location: [place];  Service: Orthopedics;  Laterality: Left;   POLYPECTOMY  04/30/2019   Procedure: POLYPECTOMY;  Surgeon: Juanita Craver, MD;  [facility];  Service: Endoscopy;;   TEE WITHOUT CARDIOVERSION N/A 06/29/2019   Procedure: TRANSESOPHAGEAL ECHOCARDIOGRAM (TEE);  Surgeon: Acie Fredrickson Wonda Cheng, MD;  [facility];  Service: Cardiovascular;  Laterality: N/A;   US ECHOCARDIOGRAPHY  02/2009   WNL, EF >55% Claiborne Billings)   US RENAL/AORTA Right 05/2009   1-59% diameter reduction renal artery, rec rpt 2 yrs   VASECTOMY N/A    Phreesia 02/21/2020   WEIL OSTEOTOMY Left 11/16/2020   Procedure: WEIL OSTEOTOMY LEFT 2ND METATARSAL,  2ND TOE DISTAL INTERPHALANGEAL RESECTION AND [name] OSTEOTOMY WEIL OSTEOTOMY THIRD METATARSAL;  Surgeon: Newt Minion, MD;  Location: [place];  Service: Orthopedics;  Laterality: Left;   Social History   Occupational History   Not on file  Tobacco Use   Smoking status: Never   Smokeless tobacco: Never  Vaping Use   Vaping Use: Never used  Substance and Sexual Activity   Alcohol use: Not Currently   Drug use: No   Sexual activity: Yes    Birth control/protection: None

## 2021-02-06 ENCOUNTER — Other Ambulatory Visit (HOSPITAL_BASED_OUTPATIENT_CLINIC_OR_DEPARTMENT_OTHER): Payer: Self-pay

## 2021-02-06 DIAGNOSIS — G4733 Obstructive sleep apnea (adult) (pediatric): Secondary | ICD-10-CM

## 2021-02-09 ENCOUNTER — Telehealth: Payer: Self-pay

## 2021-02-09 NOTE — Telephone Encounter (Signed)
Called pt back and informed him that he didn't have to do anything to prepare for his PX tomorrow. Confirmed his appt time was 10:15 am. Pt conveyed understanding.

## 2021-02-09 NOTE — Telephone Encounter (Signed)
Patient called to say he is having a procedure tomorrow and wanted to know if there is anything he needs to do to prepare for it.  Please call.

## 2021-02-10 ENCOUNTER — Other Ambulatory Visit: Payer: Self-pay | Admitting: Cardiovascular Disease

## 2021-02-10 ENCOUNTER — Other Ambulatory Visit: Payer: Self-pay

## 2021-02-10 ENCOUNTER — Ambulatory Visit: Payer: BC Managed Care – PPO | Admitting: Plastic Surgery

## 2021-02-10 VITALS — BP 128/67 | HR 63 | Ht 71.0 in | Wt 198.2 lb

## 2021-02-10 DIAGNOSIS — L989 Disorder of the skin and subcutaneous tissue, unspecified: Secondary | ICD-10-CM | POA: Diagnosis not present

## 2021-02-10 DIAGNOSIS — H02109 Unspecified ectropion of unspecified eye, unspecified eyelid: Secondary | ICD-10-CM | POA: Insufficient documentation

## 2021-02-10 DIAGNOSIS — H02834 Dermatochalasis of left upper eyelid: Secondary | ICD-10-CM | POA: Diagnosis not present

## 2021-02-10 DIAGNOSIS — F329 Major depressive disorder, single episode, unspecified: Secondary | ICD-10-CM | POA: Diagnosis not present

## 2021-02-10 DIAGNOSIS — I10 Essential (primary) hypertension: Secondary | ICD-10-CM

## 2021-02-10 DIAGNOSIS — H02132 Senile ectropion of right lower eyelid: Secondary | ICD-10-CM | POA: Diagnosis not present

## 2021-02-10 DIAGNOSIS — H02831 Dermatochalasis of right upper eyelid: Secondary | ICD-10-CM | POA: Diagnosis not present

## 2021-02-10 DIAGNOSIS — H02839 Dermatochalasis of unspecified eye, unspecified eyelid: Secondary | ICD-10-CM | POA: Insufficient documentation

## 2021-02-10 DIAGNOSIS — H02135 Senile ectropion of left lower eyelid: Secondary | ICD-10-CM

## 2021-02-10 DIAGNOSIS — F1020 Alcohol dependence, uncomplicated: Secondary | ICD-10-CM | POA: Diagnosis not present

## 2021-02-10 NOTE — Progress Notes (Signed)
° °  Subjective:    [name], male    DOB: November 05, 1955, 66 y.o.   MRN: [id number]  The patient is a 66 year old male here for evaluation of his eyes.  He had been seen previously via a telemetry visit and had complaints about a changing skin lesion of his upper lid.  When he came in today it was noted that he had significant upper lid excess tissue.  He also has complaints of dry eyes.  His eyes are red and irritated.  He is getting a lot of tearing and he has a little ectropion of the lower lids.       Review of Systems  Constitutional: Negative.   HENT: Negative.    Eyes:  Positive for redness, itching and visual disturbance.  Respiratory: Negative.    Cardiovascular: Negative.   Gastrointestinal: Negative.   Endocrine: Negative.   Genitourinary: Negative.   Musculoskeletal: Negative.   Hematological: Negative.   Psychiatric/Behavioral: Negative.        Objective:   Physical Exam Constitutional:      Appearance: Normal appearance.  HENT:     Head: Normocephalic and atraumatic.  Cardiovascular:     Rate and Rhythm: Normal rate.     Pulses: Normal pulses.  Pulmonary:     Effort: Pulmonary effort is normal.  Skin:    General: Skin is warm.     Capillary Refill: Capillary refill takes less than 2 seconds.     Coloration: Skin is not jaundiced.     Findings: No bruising.  Neurological:     Mental Status: He is alert and oriented to person, place, and time.  Psychiatric:        Mood and Affect: Mood normal.        Behavior: Behavior normal.      Assessment & Plan:     ICD-10-CM   1. Skin lesion of face  L98.9     2. Dermatochalasis of both upper eyelids  H02.831    H02.834       We decided to wait today.  I like him to get a visual field exam and an eye evaluation.  We will plan on doing a televisit with him in a month and lets reevaluate the situation.  I think he would definitely benefit from a blepharoplasty and maybe even ectropion repair of  the lower lid.  Pictures were obtained of the patient and placed in the chart with the patient's or guardian's permission.

## 2021-02-12 ENCOUNTER — Other Ambulatory Visit: Payer: Self-pay | Admitting: Family Medicine

## 2021-02-12 DIAGNOSIS — E871 Hypo-osmolality and hyponatremia: Secondary | ICD-10-CM

## 2021-02-12 NOTE — Progress Notes (Signed)
Lab only order placed for repeat assessment of hyponatremia off hydrochlorothiazide.

## 2021-02-14 ENCOUNTER — Encounter: Payer: Self-pay | Admitting: Pulmonary Disease

## 2021-02-14 ENCOUNTER — Ambulatory Visit: Payer: BC Managed Care – PPO | Admitting: Pulmonary Disease

## 2021-02-14 ENCOUNTER — Other Ambulatory Visit: Payer: Self-pay

## 2021-02-14 VITALS — BP 114/62 | HR 57 | Temp 98.2°F | Ht 71.0 in | Wt 196.0 lb

## 2021-02-14 DIAGNOSIS — J9612 Chronic respiratory failure with hypercapnia: Secondary | ICD-10-CM | POA: Diagnosis not present

## 2021-02-14 DIAGNOSIS — J9611 Chronic respiratory failure with hypoxia: Secondary | ICD-10-CM

## 2021-02-14 DIAGNOSIS — G4733 Obstructive sleep apnea (adult) (pediatric): Secondary | ICD-10-CM

## 2021-02-14 NOTE — Progress Notes (Signed)
° °  Subjective:    Patient ID: Adem Costlow, male    DOB: 03-31-1955, 66 y.o.   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, pastor for FU of OSA & restrictive lung disease due to scoliosis        PMH - Had severe asthma as a child but did not bother him as an adult, PFTs have shown moderate airway obstruction , has scoliosis but no significant restriction with normal TLC -ventral hernia repair 10/2019`  -Alcoholism, underwent rehab sober since 2021  -He was hospitalized with hypoxia & hypercarbia  In 07/2017 after trip to [facility] admission 06/2019  for enterococcal bacteremia , TEE negative-complicated by hypercarbic respiratory failure requiring mechanical ventilation for 1 day  80-month follow-up visit.  On his last visit we increased auto CPAP settings to 12 to 15 cm.  This seems to be working well.  He denies any problems with mask and pressure and wakes up feeling rested. He is continuing his training and workout regimen  We reviewed initial consultation with Dr. Redmond Baseman about inspire device.  ABG 07/2019 after hospital discharge was 7.3 2/61/203. Bicarbonate is 29  Nocturnal oximetry on CPAP/room air showed 7 minutes of desaturation less than 88%, total recording time was 7.5 hours He was on vacation for about 3 weeks and did not take his oxygen and seemed to tolerate this quite well  Significant tests/ events reviewed  ONO on CPAP/room air showed desaturation less than 88% for about 8 minutes     CT angiogram 07/2017 >> severe scoliosis, suggestion of pulmonary hypertension. Echo 04/5362 grade 2 diastolic dysfunction, normal LV function, decreased RV function but normal PA pressures EKG-right axis deviation 07/2017 ABG 7.31/72/71 on 4 L oxygen   08/2017 NPSG - 224 lbs - AHI 39/h, >> CPAP 9 cm + 2L o2   PFTs 08/2017 >> postbronchodilator ratio 71, FEV1 61% with FVC 65%, no significant broncho dilator response, TLC 90%, DLCO 96% suggestive extraparenchymal  restriction    Review of Systems neg for any significant sore throat, dysphagia, itching, sneezing, nasal congestion or excess/ purulent secretions, fever, chills, sweats, unintended wt loss, pleuritic or exertional cp, hempoptysis, orthopnea pnd or change in chronic leg swelling. Also denies presyncope, palpitations, heartburn, abdominal pain, nausea, vomiting, diarrhea or change in bowel or urinary habits, dysuria,hematuria, rash, arthralgias, visual complaints, headache, numbness weakness or ataxia.     Objective:   Physical Exam  Gen. Pleasant, well-nourished, in no distress ENT - no thrush, no pallor/icterus,no post nasal drip Neck: No JVD, no thyromegaly, no carotid bruits Lungs: no use of accessory muscles, no dullness to percussion, clear without rales or rhonchi  Cardiovascular: Rhythm regular, heart sounds  normal, no murmurs or gallops, no peripheral edema Musculoskeletal: No deformities, no cyanosis or clubbing        Assessment & Plan:

## 2021-02-14 NOTE — Assessment & Plan Note (Signed)
I congratulated Louis [name] on his weight loss.  I think he has done very well. CPAP download was reviewed after change in CPAP pressures, on auto 12 to 15 cm, shows good control of events with residual AHI 3/hour, large leak is persistent, average pressure is 12 to 13 cm. He is tolerating this better. I am concerned that he had 2 episodes of hypercarbic respiratory failure in 2019 and 2021, once due to lower respiratory infection and the second time due to enterococcal bacteremia.  He may have some degree of chronic hypoventilation and therefore he may not be the best candidate for hypoglossal nerve stimulation and in fact I would favor PAP therapy.  I explained this to him today  Weight loss encouraged, compliance with goal of at least 4-6 hrs every night is the expectation. Advised against medications with sedative side effects Cautioned against driving when sleepy - understanding that sleepiness will vary on a day to day basis

## 2021-02-14 NOTE — Patient Instructions (Addendum)
°  X ABG on RA  OK to stay off oxygen during sleep , call me back in 1 month & we can dc oxygen

## 2021-02-14 NOTE — Assessment & Plan Note (Signed)
He does still seem to have some degree of hypoventilation chronically.  We will check an ABG on room air.  If PCO2 is more than 50 I would advise him against hypoglossal nerve stimulation

## 2021-02-15 DIAGNOSIS — G4733 Obstructive sleep apnea (adult) (pediatric): Secondary | ICD-10-CM | POA: Diagnosis not present

## 2021-02-15 NOTE — Addendum Note (Signed)
Addended by: Fran Lowes on: 02/15/2021 08:28 AM   Modules accepted: Orders

## 2021-02-23 ENCOUNTER — Ambulatory Visit (HOSPITAL_COMMUNITY)
Admission: RE | Admit: 2021-02-23 | Discharge: 2021-02-23 | Disposition: A | Discharge status: Home / Self Care | Payer: BC Managed Care – PPO | Source: Ambulatory Visit | Attending: Pulmonary Disease | Admitting: Pulmonary Disease

## 2021-02-23 ENCOUNTER — Other Ambulatory Visit: Payer: Self-pay

## 2021-02-23 ENCOUNTER — Encounter (HOSPITAL_COMMUNITY): Payer: BC Managed Care – PPO

## 2021-02-23 DIAGNOSIS — J9611 Chronic respiratory failure with hypoxia: Secondary | ICD-10-CM | POA: Diagnosis not present

## 2021-02-23 DIAGNOSIS — J9612 Chronic respiratory failure with hypercapnia: Secondary | ICD-10-CM | POA: Insufficient documentation

## 2021-02-23 LAB — BLOOD GAS, ARTERIAL
Acid-Base Excess: 2.5 mmol/L — ABNORMAL HIGH (ref 0.0–2.0)
Bicarbonate: 26.3 mmol/L (ref 20.0–28.0)
Drawn by: 21179
FIO2: 21 %
O2 Saturation: 95.8 %
Patient temperature: 37
pCO2 arterial: 37 mmHg (ref 32–48)
pH, Arterial: 7.46 — ABNORMAL HIGH (ref 7.35–7.45)
pO2, Arterial: 84 mmHg (ref 83–108)

## 2021-02-28 ENCOUNTER — Encounter: Payer: Self-pay | Admitting: Registered Nurse

## 2021-02-28 ENCOUNTER — Ambulatory Visit (INDEPENDENT_AMBULATORY_CARE_PROVIDER_SITE_OTHER): Payer: BC Managed Care – PPO | Admitting: Registered Nurse

## 2021-02-28 ENCOUNTER — Other Ambulatory Visit: Payer: Self-pay

## 2021-02-28 ENCOUNTER — Ambulatory Visit (HOSPITAL_BASED_OUTPATIENT_CLINIC_OR_DEPARTMENT_OTHER)
Admission: RE | Admit: 2021-02-28 | Discharge: 2021-02-28 | Disposition: A | Discharge status: Home / Self Care | Payer: BC Managed Care – PPO | Source: Ambulatory Visit | Attending: Registered Nurse | Admitting: Registered Nurse

## 2021-02-28 VITALS — BP 118/56 | HR 70 | Temp 98.1°F | Resp 18 | Ht 71.0 in | Wt 197.2 lb

## 2021-02-28 DIAGNOSIS — M7989 Other specified soft tissue disorders: Secondary | ICD-10-CM | POA: Diagnosis not present

## 2021-02-28 NOTE — Addendum Note (Signed)
Addended by: Maximiano Coss on: 02/28/2021 02:03 PM   Modules accepted: Orders

## 2021-02-28 NOTE — Patient Instructions (Addendum)
[name] -   Rule out clot today with ultrasound  I'll let you know how results look and we will plan from there  Thanks,  Rich     If you have lab work done today you will be contacted with your lab results within the next 2 weeks.  If you have not heard from [place] then please contact us. The fastest way to get your results is to register for My Chart.   IF you received an x-ray today, you will receive an invoice from [facility]. Please contact [facility] at [phone] with questions or concerns regarding your invoice.   IF you received labwork today, you will receive an invoice from [facility]. Please contact [facility] at [phone] with questions or concerns regarding your invoice.   Our billing staff will not be able to assist you with questions regarding bills from these companies.  You will be contacted with the lab results as soon as they are available. The fastest way to get your results is to activate your My Chart account. Instructions are located on the last page of this paperwork. If you have not heard from [place] regarding the results in 2 weeks, please contact this office.

## 2021-02-28 NOTE — Progress Notes (Signed)
Established Patient Office Visit  Subjective:  [name], male    DOB: 06/01/1955  Age: 66 y.o. MRN: [id number]  CC:  Chief Complaint  Patient presents with   Leg Pain    Patient states he has been having some left knee pain and some calf pain for a couple weeks. He has took some tylenol and Gabapentin.    HPI [name] presents for L knee / calf pain  Onset 2-3 weeks ago. No acute injury or trauma noted. Initially thought to be a strain but has not resolved.   Skin feels tight. Some warmth in leg. No hx of clot  Notes long drive to and from beach recently.   Has taken gabapentin and tylenol   Past Medical History:  Diagnosis Date   Alcohol use disorder, mild, abuse    Anemia    Anxiety    situational   Arthritis    Phreesia 02/21/2020   Asthma    Phreesia 02/21/2020   BPH (benign prostatic hyperplasia)    on flomax- Patien tand wife denies- and patient has never been on Flomax   Bundle branch block    per prior PCP records   CHF (congestive heart failure) ([place])    Chronic kidney disease    Acute Renal Failure- June 2021- admission- kidneys recovered quickly   Complication of anesthesia 03/11/2019   woke up before surgery was over- "paralytic"   COPD (chronic obstructive pulmonary disease) ([place])    Diverticulosis    by colonoscopy   Eczema    per prior PCP records   Elevated PSA 2015   peaked 6s, s/p benign biopsy 2014, sees urology [name]   Essential tremor    GERD (gastroesophageal reflux disease)    per prior PCP records   History of panic attacks    per prior PCP records   HTN (hypertension)    Hypertension    Phreesia 02/21/2020   Mild intermittent asthma in adult without complication    Obesity, Class I, BMI 30-34.9    Osteoporosis    DEXA 06/2013 with osteopenia - h/o ?wrist/hip fracture from AVN from chronic steroid use (asthma), took reclast for 1 year   Pneumonia    10/08/19 >10 years ago   Seasonal  allergies    Sleep apnea    Substance abuse ([place])    Phreesia 02/21/2020   Thoracic scoliosis childhood   Transaminitis    per prior PCP records   Vitamin B 12 deficiency     Past Surgical History:  Procedure Laterality Date   APPLICATION OF WOUND VAC Left 05/09/2018   Procedure: Application Of Wound Vac;  Surgeon: Newt Minion, MD;  Location: [place];  Service: Orthopedics;  Laterality: Left;   BIOPSY  04/30/2019   Procedure: BIOPSY;  Surgeon: Juanita Craver, MD;  Location: [facility];  Service: Endoscopy;;   COLONOSCOPY  12/2013   polyps, int hem, diverticulosis, rpt 5 yrs [name])   COLONOSCOPY WITH PROPOFOL N/A 04/30/2019   Procedure: COLONOSCOPY WITH PROPOFOL;  Surgeon: Juanita Craver, MD;  Location: [facility];  Service: Endoscopy;  Laterality: N/A;   ELBOW SURGERY Right 2008   golfer's elbow   ESOPHAGOGASTRODUODENOSCOPY (EGD) WITH PROPOFOL N/A 04/30/2019   Procedure: ESOPHAGOGASTRODUODENOSCOPY (EGD) WITH PROPOFOL;  Surgeon: Juanita Craver, MD;  [facility];  Service: Endoscopy;  Laterality: N/A;   HERNIA REPAIR N/A    Phreesia 02/21/2020   INCISIONAL HERNIA REPAIR N/A 10/15/2019   Procedure: HERNIA REPAIR INCISIONAL  WITH MESH, TAR;  Surgeon: Ralene Ok, MD;  Location: [place];  Service: General;  Laterality: N/A;   INGUINAL HERNIA REPAIR Bilateral childhood & 04-15-1993   LAPAROTOMY N/A 01/05/2019   Procedure: EXPLORATORY LAPAROTOMY graham patch repair;  Surgeon: Benjamine Sprague, DO;  Location: [facility];  Service: General;  Laterality: N/A;   LAPAROTOMY N/A 10/15/2019   Procedure: EXPLORATORY LAPAROTOMY;  Surgeon: Ralene Ok, MD;  Location: [place];  Service: General;  Laterality: N/A;   LYSIS OF ADHESION N/A 10/15/2019   Procedure: LYSIS OF ADHESION;  Surgeon: Ralene Ok, MD;  Location: [place];  Service: General;  Laterality: N/A;   NASAL SEPTUM SURGERY  04-16-11   deviated septum   ORIF ANKLE FRACTURE Left 05/09/2018   Procedure: OPEN REDUCTION INTERNAL FIXATION LEFT  LISFRANC FRACTURE/DISLOCATION;  Surgeon: Newt Minion, MD;  Location: [facility];  Service: Orthopedics;  Laterality: Left;   POLYPECTOMY  04/30/2019   Procedure: POLYPECTOMY;  Surgeon: Juanita Craver, MD;  [facility];  Service: Endoscopy;;   TEE WITHOUT CARDIOVERSION N/A 06/29/2019   Procedure: TRANSESOPHAGEAL ECHOCARDIOGRAM (TEE);  Surgeon: Acie Fredrickson Wonda Cheng, MD;  [facility];  Service: Cardiovascular;  Laterality: N/A;   US ECHOCARDIOGRAPHY  02/2009   WNL, EF >55% Claiborne Billings)   US RENAL/AORTA Right 05/2009   1-59% diameter reduction renal artery, rec rpt 2 yrs   VASECTOMY N/A    Phreesia 02/21/2020   WEIL OSTEOTOMY Left 11/16/2020   Procedure: WEIL OSTEOTOMY LEFT 2ND METATARSAL,  2ND TOE DISTAL INTERPHALANGEAL RESECTION AND [name] OSTEOTOMY WEIL OSTEOTOMY THIRD METATARSAL;  Surgeon: Newt Minion, MD;  Location: [place];  Service: Orthopedics;  Laterality: Left;    Family History  Problem Relation Age of Onset   Cancer Father 42       colon   Prostate cancer Father    Diabetes Mother    Alcohol abuse Mother    Stroke Mother    Cancer Maternal Grandmother        pancreatic   Ataxia Brother    Ataxia Sister    CAD Neg Hx     Social History   Socioeconomic History   Marital status: Married    Spouse name: Not on file   Number of children: Not on file   Years of education: Not on file   Highest education level: Not on file  Occupational History   Not on file  Tobacco Use   Smoking status: Never   Smokeless tobacco: Never  Vaping Use   Vaping Use: Never used  Substance and Sexual Activity   Alcohol use: Not Currently   Drug use: No   Sexual activity: Yes    Birth control/protection: None  Other Topics Concern   Not on file  Social History Narrative   Lives with wife (retired Materials engineer), 23yo dog died 04/16/14   Grown children   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, was Chief Executive Officer   Edu: [employer], masters in Investment banker, operational    Activity: TRX and cardio and pilates   Diet: good water,  fruits/vegetables daily   Social Determinants of Radio broadcast assistant Strain: Not on file  Food Insecurity: Not on file  Transportation Needs: Not on file  Physical Activity: Not on file  Stress: Not on file  Social Connections: Not on file  Intimate Partner Violence: Not on file    Outpatient Medications Prior to Visit  Medication Sig Dispense Refill   acetaminophen (TYLENOL) 500 MG tablet Take 500 mg by mouth every 6 (six) hours  as needed for mild pain.     amLODipine (NORVASC) 10 MG tablet TAKE 1 TABLET(10 MG) BY MOUTH DAILY 90 tablet 2   diclofenac Sodium (VOLTAREN) 1 % GEL Apply 1 application topically 4 (four) times daily as needed (knee pain).     gabapentin (NEURONTIN) 300 MG capsule Take 1 capsule (300 mg total) by mouth 3 (three) times daily. 270 capsule 1   hydrochlorothiazide (HYDRODIURIL) 12.5 MG tablet Take 1 tablet (12.5 mg total) by mouth daily. 90 tablet 2   losartan (COZAAR) 100 MG tablet TAKE 1 TABLET(100 MG) BY MOUTH DAILY 90 tablet 2   metoprolol succinate (TOPROL-XL) 50 MG 24 hr tablet TAKE 1 TABLET(50 MG) BY MOUTH DAILY WITH A MEAL 90 tablet 2   Multiple Vitamin (MULTIVITAMIN WITH MINERALS) TABS tablet Take 1 tablet by mouth daily.     naltrexone (DEPADE) 50 MG tablet TAKE 1 TABLET(50 MG) BY MOUTH DAILY 90 tablet 2   olopatadine (PATANOL) 0.1 % ophthalmic solution Place 1 drop into both eyes 2 (two) times daily as needed for allergies.     omeprazole (PRILOSEC) 20 MG capsule Take 20 mg by mouth daily.     oxyCODONE-acetaminophen (PERCOCET/ROXICET) 5-325 MG tablet Take 1 tablet by mouth every 4 (four) hours as needed. 30 tablet 0   traMADol (ULTRAM) 50 MG tablet Take 1 tablet (50 mg total) by mouth every 6 (six) hours as needed for moderate pain. 30 tablet 0   No facility-administered medications prior to visit.    Allergies  Allergen Reactions   Accupril [Quinapril Hcl] Cough   Nsaids     Perforated gastric ulcer    ROS Review of Systems Per hpi      Objective:    Physical Exam Constitutional:      General: He is not in acute distress.    Appearance: Normal appearance. He is normal weight. He is not ill-appearing, toxic-appearing or diaphoretic.  Cardiovascular:     Rate and Rhythm: Normal rate and regular rhythm.     Heart sounds: Normal heart sounds. No murmur heard.   No friction rub. No gallop.  Pulmonary:     Effort: Pulmonary effort is normal. No respiratory distress.     Breath sounds: Normal breath sounds. No stridor. No wheezing, rhonchi or rales.  Chest:     Chest wall: No tenderness.  Musculoskeletal:        General: Swelling and tenderness (calf muscle) present. No deformity or signs of injury. Normal range of motion.     Right lower leg: No edema.     Left lower leg: Edema present.  Neurological:     General: No focal deficit present.     Mental Status: He is alert and oriented to person, place, and time. Mental status is at baseline.  Psychiatric:        Mood and Affect: Mood normal.        Behavior: Behavior normal.        Thought Content: Thought content normal.        Judgment: Judgment normal.    BP (!) 118/56    Pulse 70    Temp 98.1 F (36.7 C) (Temporal)    Resp 18    Ht 5\' 11"  (1.803 m)    Wt 197 lb 3.2 oz (89.4 kg)    SpO2 93%    BMI 27.50 kg/m  Wt Readings from Last 3 Encounters:  02/28/21 197 lb 3.2 oz (89.4 kg)  02/14/21 196 lb (88.9 kg)  02/10/21 198 lb 3.2 oz (89.9 kg)     Health Maintenance Due  Topic Date Due   COVID-19 Vaccine (5 - Booster for Pfizer series) 06/04/2020    There are no preventive care reminders to display for this patient.  Lab Results  Component Value Date   TSH 0.937 07/02/2019   Lab Results  Component Value Date   WBC 5.8 11/16/2020   HGB 12.8 (L) 11/16/2020   HCT 39.2 11/16/2020   MCV 92.5 11/16/2020   PLT 304 11/16/2020   Lab Results  Component Value Date   NA 132 (L) 02/02/2021   K 4.1 02/02/2021   CO2 29 02/02/2021   GLUCOSE 88 02/02/2021    BUN 23 02/02/2021   CREATININE 0.99 02/02/2021   BILITOT 1.2 07/14/2020   ALKPHOS 69 07/14/2020   AST 16 07/14/2020   ALT 14 07/14/2020   PROT 7.4 07/14/2020   ALBUMIN 4.6 07/14/2020   CALCIUM 10.0 02/02/2021   ANIONGAP 7 11/16/2020   GFR 81.01 02/02/2021   Lab Results  Component Value Date   CHOL 180 01/11/2020   Lab Results  Component Value Date   HDL 123 01/11/2020   Lab Results  Component Value Date   LDLCALC 50 01/11/2020   Lab Results  Component Value Date   TRIG 24 01/11/2020   Lab Results  Component Value Date   CHOLHDL 1.5 01/11/2020   Lab Results  Component Value Date   HGBA1C 5.4 07/14/2020      Assessment & Plan:   Problem List Items Addressed This Visit   None Visit Diagnoses     Left leg swelling    -  Primary   Relevant Orders   VAS [place] LOWER EXTREMITY VENOUS (DVT)       No orders of the defined types were placed in this encounter.   Follow-up: Return if symptoms worsen or fail to improve.   PLAN Rule out clot. With [place] today If ruled out, consider muscle strain Patient encouraged to call clinic with any questions, comments, or concerns.   Maximiano Coss, NP

## 2021-03-01 ENCOUNTER — Ambulatory Visit: Payer: BC Managed Care – PPO

## 2021-03-01 DIAGNOSIS — H04203 Unspecified epiphora, bilateral lacrimal glands: Secondary | ICD-10-CM | POA: Diagnosis not present

## 2021-03-01 DIAGNOSIS — G4733 Obstructive sleep apnea (adult) (pediatric): Secondary | ICD-10-CM

## 2021-03-01 DIAGNOSIS — H02831 Dermatochalasis of right upper eyelid: Secondary | ICD-10-CM | POA: Diagnosis not present

## 2021-03-01 DIAGNOSIS — H02834 Dermatochalasis of left upper eyelid: Secondary | ICD-10-CM | POA: Diagnosis not present

## 2021-03-01 DIAGNOSIS — H04413 Chronic dacryocystitis of bilateral lacrimal passages: Secondary | ICD-10-CM | POA: Diagnosis not present

## 2021-03-02 ENCOUNTER — Telehealth: Payer: Self-pay | Admitting: Pulmonary Disease

## 2021-03-02 DIAGNOSIS — G4733 Obstructive sleep apnea (adult) (pediatric): Secondary | ICD-10-CM | POA: Diagnosis not present

## 2021-03-02 NOTE — Telephone Encounter (Signed)
HST showed mild  OSA with AHI 10/ hr ? ?Events were worse in supine sleep ? ?Suggest he continue on CPAP for now. Degree of OSA is too mild to consider inspire implant ? ? ?

## 2021-03-04 DIAGNOSIS — J9601 Acute respiratory failure with hypoxia: Secondary | ICD-10-CM | POA: Diagnosis not present

## 2021-03-09 DIAGNOSIS — H02831 Dermatochalasis of right upper eyelid: Secondary | ICD-10-CM | POA: Diagnosis not present

## 2021-03-09 DIAGNOSIS — H02834 Dermatochalasis of left upper eyelid: Secondary | ICD-10-CM | POA: Diagnosis not present

## 2021-03-09 NOTE — Telephone Encounter (Signed)
Spoke with patient  regarding HST results. They verbalized understanding. No further questions.  Nothing further needed at this time.   

## 2021-03-13 ENCOUNTER — Telehealth (INDEPENDENT_AMBULATORY_CARE_PROVIDER_SITE_OTHER): Payer: BC Managed Care – PPO | Admitting: Pulmonary Disease

## 2021-03-13 ENCOUNTER — Encounter: Payer: Self-pay | Admitting: Pulmonary Disease

## 2021-03-13 ENCOUNTER — Other Ambulatory Visit: Payer: Self-pay

## 2021-03-13 DIAGNOSIS — J9611 Chronic respiratory failure with hypoxia: Secondary | ICD-10-CM

## 2021-03-13 DIAGNOSIS — J9612 Chronic respiratory failure with hypercapnia: Secondary | ICD-10-CM

## 2021-03-13 DIAGNOSIS — G4733 Obstructive sleep apnea (adult) (pediatric): Secondary | ICD-10-CM

## 2021-03-13 NOTE — Assessment & Plan Note (Signed)
Based on our evaluation with nocturnal oximetry he does not need oxygen and this has been discontinued ?

## 2021-03-13 NOTE — Assessment & Plan Note (Signed)
I think Louis [name] is done remarkably well with weight loss and due to this the severity of his OSA and his cardiovascular risk is decreased.  He now only has mild OSA especially in the supine position.  His events are minimal when he is in the lateral position.  We could possibly do with positional therapy alone however I have asked him to continue on CPAP for now due to his prior episodes of hypercarbic respiratory failure. ?He certainly does not need the inspire device.  I think that oral appliance alone would be suboptimal ?

## 2021-03-13 NOTE — Progress Notes (Signed)
? ?  Subjective:  ? ? [name], male    DOB: 1955/05/09, 66 y.o.   MRN: [id number] ? ?HPI ? ?I connected with  [name] on 03/13/21 by phone/  video enabled telemedicine application and verified that I am speaking with the correct person using two identifiers. ? ? ?  ?Location: ?Patient: Home ?Provider: [facility] - [address], [place], [place] [address] ?  ?I discussed the limitations of evaluation and management by telemedicine and the availability of in person appointments. The patient expressed understanding and agreed to proceed. I also discussed with the patient that there may be a patient responsible charge related to this service. The patient expressed understanding and agreed to proceed. ?  ?Patient consented to consult via video: Yes ?People present and their role in pt care: Pt ? ? 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  ?  ? PMH - Had severe asthma as a child but did not bother him as an adult, PFTs have shown moderate airway obstruction , has scoliosis but no significant restriction with normal TLC ?-ventral hernia repair 10/2019`  ?-Alcoholism, underwent rehab sober since 2021 ?  ?-He was hospitalized with hypoxia & hypercarbia  In 07/2017 after trip to [place] ?  ?Hospital admission 06/2019  for enterococcal bacteremia , TEE negative-complicated by hypercarbic respiratory failure requiring mechanical ventilation for 1 day ? ?At his last office visit in February we decided to undertake evaluation for inspire device including repeat sleep study, ABG ?ABG did not show significant hypercarbia. ?We reviewed home sleep study results today ? ?Significant tests/ events reviewed ? ?ONO on CPAP/room air showed desaturation less than 88% for about 8 minutes ?  ?  ?CT angiogram 07/2017 >> severe scoliosis, suggestion of pulmonary hypertension. ?Echo 06/1948 grade 2 diastolic dysfunction, normal LV function,  decreased RV function but normal PA pressures ?EKG-right axis deviation ?07/2017 ABG 7.31/72/71 on 4 L oxygen ?  ? ?03/2021 HST >> mild OSA, predominantly during supine position, lowest desat 83% ?08/2017 NPSG - 224 lbs - AHI 39/h, >> CPAP 9 cm + 2L o2 ?  ?PFTs 08/2017 >> postbronchodilator ratio 71, FEV1 61% with FVC 65%, no significant broncho dilator response, TLC 90%, DLCO 96% suggestive extraparenchymal restriction ? ? ?Review of Systems ?neg for any significant sore throat, dysphagia, itching, sneezing, nasal congestion or excess/ purulent secretions, fever, chills, sweats, unintended wt loss, pleuritic or exertional cp, hempoptysis, orthopnea pnd or change in chronic leg swelling. Also denies presyncope, palpitations, heartburn, abdominal pain, nausea, vomiting, diarrhea or change in bowel or urinary habits, dysuria,hematuria, rash, arthralgias, visual complaints, headache, numbness weakness or ataxia. ? ?   ?Objective:  ? Physical Exam ? ?Gen. Pleasant, obese, in no distress ?ENT - no lesions, no post nasal drip ?Neck: No JVD, no thyromegaly, no carotid bruits ?Lungs: no use of accessory muscles, no dullness to percussion, decreased without rales or rhonchi  ?Cardiovascular: Rhythm regular, heart sounds  normal, no murmurs or gallops, no peripheral edema ?Musculoskeletal: No deformities, no cyanosis or clubbing , no tremors ? ? ? ?   ?Assessment & Plan:  ? ? ?Total encounter time was 23 minutes ?

## 2021-03-14 ENCOUNTER — Encounter: Payer: Self-pay | Admitting: Plastic Surgery

## 2021-03-14 ENCOUNTER — Telehealth (INDEPENDENT_AMBULATORY_CARE_PROVIDER_SITE_OTHER): Payer: BC Managed Care – PPO | Admitting: Plastic Surgery

## 2021-03-14 DIAGNOSIS — H02831 Dermatochalasis of right upper eyelid: Secondary | ICD-10-CM | POA: Diagnosis not present

## 2021-03-14 DIAGNOSIS — H02834 Dermatochalasis of left upper eyelid: Secondary | ICD-10-CM | POA: Diagnosis not present

## 2021-03-14 NOTE — Progress Notes (Signed)
? ?  Subjective:  ? ? [name], male    DOB: [birth year], 66 y.o.   MRN: [id number] ? ?Patient is a 66 year old man joining me by video telemetry visit.  He was seen in the office a few weeks ago for upper lid dermatochalasis.  I wanted him to see his eye doctor and do a visual field exam.  The visual field exam is done and we should be getting that in at any time.  I have the evaluation from Dr. Katy Fitch who said the dye disappearance test was positive and it was probably a chronic dacryocystitis.  I talked to the patient about this and a consideration for dacrocystorhinostomy.  He would like to wait on that.  I think that is reasonable. ? ? ?Review of Systems  ?Constitutional: Negative.   ?HENT: Negative.    ?Eyes: Negative.   ?Respiratory: Negative.    ? ?   ?Objective:  ? Physical Exam ? ? ?   ?Assessment & Plan:  ? ?  ICD-10-CM   ?1. Dermatochalasis of both upper eyelids  H02.831   ? D03.013   ?  ?  ?I connected with  Louis [name] on 03/14/21 by a video enabled telemedicine application and verified that I am speaking with the correct person using two identifiers. ?  ?I discussed the limitations of evaluation and management by telemedicine. The patient expressed understanding and agreed to proceed. ?The patient was at home and I was at the office.  I spent 15 minutes in the following manner: Review of chart, review of Dr. Zenia Resides evaluation, discussion with patient and documentation. ? ? ? ? ?

## 2021-03-16 DIAGNOSIS — M25569 Pain in unspecified knee: Secondary | ICD-10-CM | POA: Diagnosis not present

## 2021-03-26 ENCOUNTER — Encounter: Payer: Self-pay | Admitting: Pulmonary Disease

## 2021-03-26 DIAGNOSIS — J9611 Chronic respiratory failure with hypoxia: Secondary | ICD-10-CM

## 2021-03-27 NOTE — Telephone Encounter (Signed)
"  Hi, I called [facility] to return my oxygen equipment since I don't need it anymore per [name]. They said they needed an order from [name]. He wanted me to see how I was doing without it. I've been fine. Please forward Dr. Bari Mantis order to [employer]. Thanks!" ? ? ?[name] please advise  ?

## 2021-03-28 ENCOUNTER — Telehealth: Payer: Self-pay | Admitting: Plastic Surgery

## 2021-03-28 NOTE — Telephone Encounter (Signed)
Pt returned the call to the office. 

## 2021-03-28 NOTE — Telephone Encounter (Signed)
Left voicemail to discuss surgery date.  ?

## 2021-03-30 ENCOUNTER — Encounter (HOSPITAL_BASED_OUTPATIENT_CLINIC_OR_DEPARTMENT_OTHER): Payer: Self-pay | Admitting: Plastic Surgery

## 2021-03-30 ENCOUNTER — Telehealth: Payer: Self-pay | Admitting: Pulmonary Disease

## 2021-03-30 ENCOUNTER — Other Ambulatory Visit: Payer: Self-pay

## 2021-03-30 NOTE — Progress Notes (Signed)
Reviewed case with Dr. Marcie Bal. Ok to proceed as scheduled at [facility]. ?

## 2021-03-30 NOTE — Telephone Encounter (Signed)
Fax received from Dr. Lyndee Leo Dillingham to perform a bilateral upper bleph on patient.  Patient needs surgery clearance. Patient was seen on 03/02/2021. Office protocol is a risk assessment can be sent to surgeon if patient has been seen in 60 days or less.  ? ?Sending to [name] for risk assessment or recommendations if patient needs to be seen in office prior to surgical procedure.   ?

## 2021-03-31 DIAGNOSIS — F1020 Alcohol dependence, uncomplicated: Secondary | ICD-10-CM | POA: Diagnosis not present

## 2021-04-04 DIAGNOSIS — J9601 Acute respiratory failure with hypoxia: Secondary | ICD-10-CM | POA: Diagnosis not present

## 2021-04-04 NOTE — H&P (View-Only) (Signed)
? ?  Patient ID: Demontrez Rindfleisch, male    DOB: 1955/04/15, 66 y.o.   MRN: [id number] ? ?Chief Complaint  ?Patient presents with  ? Pre-op Exam  ? ? ?  ICD-10-CM   ?1. Dermatochalasis of both upper eyelids  H02.831   ? E31.540   ?  ? ? ? ?History of Present Illness: ?Jeran Hiltz is a 66 y.o.  male  with a history of dermatochalasis upper eyelids.  He presents for preoperative evaluation for upcoming procedure, upper eyelid blepharoplasty, scheduled for 04/12/2021 with Dr. Marla Roe. ? 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  We will make anesthesiologist aware on day of surgery.  No smoking or nicotine containing products.  Denies any recent hospitalization, injuries, or illnesses.  He is not on any hormone replacement medications.  No personal or family history of blood clots or clotting disorder.  No personal history of cancer.  Reports that he had a history of obstructive CAD, but has since followed up with his cardiologist who tells him that he is clear.  He no longer sees cardiology regularly.  His pulmonologist, [name], has already cleared him for surgery.  He states that he has been doing well from a respiratory standpoint and has not had any recent issues.  He lives an active life and discussed temporary activity modifications subsequent to surgery. ? ?Summary of Previous Visit: Patient was first seen for consult 06/14/2020.  At that time, he only requested excision of skin lesion on upper eyelid.  He was not interested in blepharoplasty at that time.  However, after successful excision performed 02/10/2021, he scheduled video visit to discuss blepharoplasty.  He likely has chronic dacryocystitis.  Discussed possible dacryocystorhinostomy.   ? ?Job: Retired Forensic psychologist. ? ?PMH Significant for: Chronic respiratory failure with hypoxia and hypercapnia, OSA, HTN, restrictive lung disease due to scoliosis ? ?Patient will need surgical clearance  from [name] with [facility] pulmonology.  Per telephone correspondence in chart, patient is cleared with due risk from pulmonary standpoint. ? ? ?Past Medical History: ?Allergies: ?Allergies  ?Allergen Reactions  ? Accupril [Quinapril Hcl] Cough  ? Nsaids   ?  Perforated gastric ulcer  ? ? ?Current Medications: ? ?Current Outpatient Medications:  ?  acetaminophen (TYLENOL) 500 MG tablet, Take 500 mg by mouth every 6 (six) hours as needed for mild pain., Disp: , Rfl:  ?  amLODipine (NORVASC) 10 MG tablet, TAKE 1 TABLET(10 MG) BY MOUTH DAILY, Disp: 90 tablet, Rfl: 2 ?  diclofenac Sodium (VOLTAREN) 1 % GEL, Apply 1 application topically 4 (four) times daily as needed (knee pain)., Disp: , Rfl:  ?  gabapentin (NEURONTIN) 300 MG capsule, Take 1 capsule (300 mg total) by mouth 3 (three) times daily., Disp: 270 capsule, Rfl: 1 ?  hydrochlorothiazide (HYDRODIURIL) 12.5 MG tablet, Take 1 tablet (12.5 mg total) by mouth daily., Disp: 90 tablet, Rfl: 2 ?  losartan (COZAAR) 100 MG tablet, TAKE 1 TABLET(100 MG) BY MOUTH DAILY, Disp: 90 tablet, Rfl: 2 ?  metoprolol succinate (TOPROL-XL) 50 MG 24 hr tablet, TAKE 1 TABLET(50 MG) BY MOUTH DAILY WITH A MEAL, Disp: 90 tablet, Rfl: 2 ?  Multiple Vitamin (MULTIVITAMIN WITH MINERALS) TABS tablet, Take 1 tablet by mouth daily., Disp: , Rfl:  ?  naltrexone (DEPADE) 50 MG tablet, TAKE 1 TABLET(50 MG) BY MOUTH DAILY, Disp: 90 tablet, Rfl: 2 ?  olopatadine (PATANOL) 0.1 % ophthalmic solution, Place 1 drop into both eyes  2 (two) times daily as needed for allergies., Disp: , Rfl:  ?  omeprazole (PRILOSEC) 20 MG capsule, Take 20 mg by mouth daily., Disp: , Rfl:  ?  oxyCODONE-acetaminophen (PERCOCET/ROXICET) 5-325 MG tablet, Take 1 tablet by mouth every 4 (four) hours as needed., Disp: 30 tablet, Rfl: 0 ?  traMADol (ULTRAM) 50 MG tablet, Take 1 tablet (50 mg total) by mouth every 6 (six) hours as needed for moderate pain., Disp: 30 tablet, Rfl: 0 ? ?Past Medical Problems: ?Past Medical History:   ?Diagnosis Date  ? Alcohol use disorder, mild, abuse   ? Anemia   ? Anxiety   ? situational  ? Arthritis   ? Phreesia 02/21/2020  ? Asthma   ? Phreesia 02/21/2020  ? BPH (benign prostatic hyperplasia)   ? on flomax- Patien tand wife denies- and patient has never been on Flomax  ? Bundle branch block   ? per prior PCP records  ? CHF (congestive heart failure) ([place])   ? Chronic kidney disease   ? Acute Renal Failure- June 2021- admission- kidneys recovered quickly  ? Complication of anesthesia 03/11/2019  ? woke up before surgery was over- "paralytic"  ? COPD (chronic obstructive pulmonary disease) ([place])   ? Diverticulosis   ? by colonoscopy  ? Eczema   ? per prior PCP records  ? Elevated PSA 2015  ? peaked 6s, s/p benign biopsy 2014, sees urology [name] ([place])  ? Essential tremor   ? GERD (gastroesophageal reflux disease)   ? per prior PCP records  ? History of panic attacks   ? per prior PCP records  ? HTN (hypertension)   ? Hypertension   ? Phreesia 02/21/2020  ? Mild intermittent asthma in adult without complication   ? Obesity, Class I, BMI 30-34.9   ? Osteoporosis   ? DEXA 06/2013 with osteopenia - h/o ?wrist/hip fracture from AVN from chronic steroid use (asthma), took reclast for 1 year  ? Pneumonia   ? 10/08/19 >10 years ago  ? Seasonal allergies   ? Sleep apnea   ? Substance abuse ([place])   ? Phreesia 02/21/2020  ? Thoracic scoliosis childhood  ? Transaminitis   ? per prior PCP records  ? Vitamin B 12 deficiency   ? ? ?Past Surgical History: ?Past Surgical History:  ?Procedure Laterality Date  ? APPLICATION OF WOUND VAC Left 05/09/2018  ? Procedure: Application Of Wound Vac;  Surgeon: Newt Minion, MD;  Location: [place];  Service: Orthopedics;  Laterality: Left;  ? BIOPSY  04/30/2019  ? Procedure: BIOPSY;  Surgeon: Juanita Craver, MD;  [facility];  Service: Endoscopy;;  ? COLONOSCOPY  12/2013  ? polyps, int hem, diverticulosis, rpt 5 yrs [name])  ? COLONOSCOPY WITH PROPOFOL N/A 04/30/2019  ?  Procedure: COLONOSCOPY WITH PROPOFOL;  Surgeon: Juanita Craver, MD;  [facility];  Service: Endoscopy;  Laterality: N/A;  ? ELBOW SURGERY Right 2008  ? golfer's elbow  ? ESOPHAGOGASTRODUODENOSCOPY (EGD) WITH PROPOFOL N/A 04/30/2019  ? Procedure: ESOPHAGOGASTRODUODENOSCOPY (EGD) WITH PROPOFOL;  Surgeon: Juanita Craver, MD;  [facility];  Service: Endoscopy;  Laterality: N/A;  ? HERNIA REPAIR N/A   ? Phreesia 02/21/2020  ? INCISIONAL HERNIA REPAIR N/A 10/15/2019  ? Procedure: HERNIA REPAIR INCISIONAL WITH MESH, TAR;  Surgeon: Ralene Ok, MD;  Location: [place];  Service: General;  Laterality: N/A;  ? INGUINAL HERNIA REPAIR Bilateral childhood & ~1995  ? LAPAROTOMY N/A 01/05/2019  ? Procedure: EXPLORATORY LAPAROTOMY graham patch repair;  Surgeon: Lysle Pearl,  Isami, DO;  Location: [facility];  Service: General;  Laterality: N/A;  ? LAPAROTOMY N/A 10/15/2019  ? Procedure: EXPLORATORY LAPAROTOMY;  Surgeon: Ralene Ok, MD;  Location: [place];  Service: General;  Laterality: N/A;  ? LYSIS OF ADHESION N/A 10/15/2019  ? Procedure: LYSIS OF ADHESION;  Surgeon: Ralene Ok, MD;  Location: [place];  Service: General;  Laterality: N/A;  ? NASAL SEPTUM SURGERY  2013  ? deviated septum  ? ORIF ANKLE FRACTURE Left 05/09/2018  ? Procedure: OPEN REDUCTION INTERNAL FIXATION LEFT LISFRANC FRACTURE/DISLOCATION;  Surgeon: Newt Minion, MD;  Location: [place];  Service: Orthopedics;  Laterality: Left;  ? POLYPECTOMY  04/30/2019  ? Procedure: POLYPECTOMY;  Surgeon: Juanita Craver, MD;  [facility];  Service: Endoscopy;;  ? TEE WITHOUT CARDIOVERSION N/A 06/29/2019  ? Procedure: TRANSESOPHAGEAL ECHOCARDIOGRAM (TEE);  Surgeon: Acie Fredrickson Wonda Cheng, MD;  [facility] END[facility]COPY;  Service: Cardiovascular;  Laterality: N/A;  ? US ECHOCARDIOGRAPHY  02/2009  ? WNL, EF >55% Claiborne Billings)  ? US RENAL/AORTA Right 05/2009  ? 1-59% diameter reduction renal artery, rec rpt 2 yrs  ? VASECTOMY N/A   ? Phreesia 02/21/2020  ? WEIL OSTEOTOMY Left  11/16/2020  ? Procedure: WEIL OSTEOTOMY LEFT 2ND METATARSAL,  2ND TOE DISTAL INTERPHALANGEAL RESECTION AND [name] OSTEOTOMY WEIL OSTEOTOMY THIRD METATARSAL;  Surgeon: Newt Minion, MD;  Location: [place];  Imagene Riches

## 2021-04-04 NOTE — Progress Notes (Signed)
? ?  [name], male    DOB: 27-Jan-1955, 66 y.o.   MRN: [id number] ? ?Chief Complaint  ?Patient presents with  ? Pre-op Exam  ? ? ?  ICD-10-CM   ?1. Dermatochalasis of both upper eyelids  H02.831   ? C37.628   ?  ? ? ? ?History of Present Illness: ?Louis [name] is a 66 y.o.  male  with a history of dermatochalasis upper eyelids.  He presents for preoperative evaluation for upcoming procedure, upper eyelid blepharoplasty, scheduled for 04/12/2021 with Dr. Marla Roe. ? 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  We will make anesthesiologist aware on day of surgery.  No smoking or nicotine containing products.  Denies any recent hospitalization, injuries, or illnesses.  He is not on any hormone replacement medications.  No personal or family history of blood clots or clotting disorder.  No personal history of cancer.  Reports that he had a history of obstructive CAD, but has since followed up with his cardiologist who tells him that he is clear.  He no longer sees cardiology regularly.  His pulmonologist, [name], has already cleared him for surgery.  He states that he has been doing well from a respiratory standpoint and has not had any recent issues.  He lives an active life and discussed temporary activity modifications subsequent to surgery. ? ?Summary of Previous Visit: Patient was first seen for consult 06/14/2020.  At that time, he only requested excision of skin lesion on upper eyelid.  He was not interested in blepharoplasty at that time.  However, after successful excision performed 02/10/2021, he scheduled video visit to discuss blepharoplasty.  He likely has chronic dacryocystitis.  Discussed possible dacryocystorhinostomy.   ? ?Job: Retired Forensic psychologist. ? ?PMH Significant for: Chronic respiratory failure with hypoxia and hypercapnia, OSA, HTN, restrictive lung disease due to scoliosis ? ?Patient will need surgical clearance  from [name] with [facility] pulmonology.  Per telephone correspondence in chart, patient is cleared with due risk from pulmonary standpoint. ? ? ?Past Medical History: ?Allergies: ?Allergies  ?Allergen Reactions  ? Accupril [Quinapril Hcl] Cough  ? Nsaids   ?  Perforated gastric ulcer  ? ? ?Current Medications: ? ?Current Outpatient Medications:  ?  acetaminophen (TYLENOL) 500 MG tablet, Take 500 mg by mouth every 6 (six) hours as needed for mild pain., Disp: , Rfl:  ?  amLODipine (NORVASC) 10 MG tablet, TAKE 1 TABLET(10 MG) BY MOUTH DAILY, Disp: 90 tablet, Rfl: 2 ?  diclofenac Sodium (VOLTAREN) 1 % GEL, Apply 1 application topically 4 (four) times daily as needed (knee pain)., Disp: , Rfl:  ?  gabapentin (NEURONTIN) 300 MG capsule, Take 1 capsule (300 mg total) by mouth 3 (three) times daily., Disp: 270 capsule, Rfl: 1 ?  hydrochlorothiazide (HYDRODIURIL) 12.5 MG tablet, Take 1 tablet (12.5 mg total) by mouth daily., Disp: 90 tablet, Rfl: 2 ?  losartan (COZAAR) 100 MG tablet, TAKE 1 TABLET(100 MG) BY MOUTH DAILY, Disp: 90 tablet, Rfl: 2 ?  metoprolol succinate (TOPROL-XL) 50 MG 24 hr tablet, TAKE 1 TABLET(50 MG) BY MOUTH DAILY WITH A MEAL, Disp: 90 tablet, Rfl: 2 ?  Multiple Vitamin (MULTIVITAMIN WITH MINERALS) TABS tablet, Take 1 tablet by mouth daily., Disp: , Rfl:  ?  naltrexone (DEPADE) 50 MG tablet, TAKE 1 TABLET(50 MG) BY MOUTH DAILY, Disp: 90 tablet, Rfl: 2 ?  olopatadine (PATANOL) 0.1 % ophthalmic solution, Place 1 drop into both eyes  2 (two) times daily as needed for allergies., Disp: , Rfl:  ?  omeprazole (PRILOSEC) 20 MG capsule, Take 20 mg by mouth daily., Disp: , Rfl:  ?  oxyCODONE-acetaminophen (PERCOCET/ROXICET) 5-325 MG tablet, Take 1 tablet by mouth every 4 (four) hours as needed., Disp: 30 tablet, Rfl: 0 ?  traMADol (ULTRAM) 50 MG tablet, Take 1 tablet (50 mg total) by mouth every 6 (six) hours as needed for moderate pain., Disp: 30 tablet, Rfl: 0 ? ?Past Medical Problems: ?Past Medical History:   ?Diagnosis Date  ? Alcohol use disorder, mild, abuse   ? Anemia   ? Anxiety   ? situational  ? Arthritis   ? Phreesia 02/21/2020  ? Asthma   ? Phreesia 02/21/2020  ? BPH (benign prostatic hyperplasia)   ? on flomax- Patien tand wife denies- and patient has never been on Flomax  ? Bundle branch block   ? per prior PCP records  ? CHF (congestive heart failure) ([place])   ? Chronic kidney disease   ? Acute Renal Failure- June 2021- admission- kidneys recovered quickly  ? Complication of anesthesia 03/11/2019  ? woke up before surgery was over- "paralytic"  ? COPD (chronic obstructive pulmonary disease) ([place])   ? Diverticulosis   ? by colonoscopy  ? Eczema   ? per prior PCP records  ? Elevated PSA 2015  ? peaked 6s, s/p benign biopsy 2014, sees urology [name] ([place])  ? Essential tremor   ? GERD (gastroesophageal reflux disease)   ? per prior PCP records  ? History of panic attacks   ? per prior PCP records  ? HTN (hypertension)   ? Hypertension   ? Phreesia 02/21/2020  ? Mild intermittent asthma in adult without complication   ? Obesity, Class I, BMI 30-34.9   ? Osteoporosis   ? DEXA 06/2013 with osteopenia - h/o ?wrist/hip fracture from AVN from chronic steroid use (asthma), took reclast for 1 year  ? Pneumonia   ? 10/08/19 >10 years ago  ? Seasonal allergies   ? Sleep apnea   ? Substance abuse ([place])   ? Phreesia 02/21/2020  ? Thoracic scoliosis childhood  ? Transaminitis   ? per prior PCP records  ? Vitamin B 12 deficiency   ? ? ?Past Surgical History: ?Past Surgical History:  ?Procedure Laterality Date  ? APPLICATION OF WOUND VAC Left 05/09/2018  ? Procedure: Application Of Wound Vac;  Surgeon: Newt Minion, MD;  Location: [place];  Service: Orthopedics;  Laterality: Left;  ? BIOPSY  04/30/2019  ? Procedure: BIOPSY;  Surgeon: Juanita Craver, MD;  [facility];  Service: Endoscopy;;  ? COLONOSCOPY  12/2013  ? polyps, int hem, diverticulosis, rpt 5 yrs [name])  ? COLONOSCOPY WITH PROPOFOL N/A 04/30/2019  ?  Procedure: COLONOSCOPY WITH PROPOFOL;  Surgeon: Juanita Craver, MD;  [facility];  Service: Endoscopy;  Laterality: N/A;  ? ELBOW SURGERY Right 2008  ? golfer's elbow  ? ESOPHAGOGASTRODUODENOSCOPY (EGD) WITH PROPOFOL N/A 04/30/2019  ? Procedure: ESOPHAGOGASTRODUODENOSCOPY (EGD) WITH PROPOFOL;  Surgeon: Juanita Craver, MD;  [facility];  Service: Endoscopy;  Laterality: N/A;  ? HERNIA REPAIR N/A   ? Phreesia 02/21/2020  ? INCISIONAL HERNIA REPAIR N/A 10/15/2019  ? Procedure: HERNIA REPAIR INCISIONAL WITH MESH, TAR;  Surgeon: Ralene Ok, MD;  Location: [place];  Service: General;  Laterality: N/A;  ? INGUINAL HERNIA REPAIR Bilateral childhood & ~1995  ? LAPAROTOMY N/A 01/05/2019  ? Procedure: EXPLORATORY LAPAROTOMY graham patch repair;  Surgeon: Lysle Pearl,  Isami, DO;  Location: [facility];  Service: General;  Laterality: N/A;  ? LAPAROTOMY N/A 10/15/2019  ? Procedure: EXPLORATORY LAPAROTOMY;  Surgeon: Ralene Ok, MD;  Location: [place];  Service: General;  Laterality: N/A;  ? LYSIS OF ADHESION N/A 10/15/2019  ? Procedure: LYSIS OF ADHESION;  Surgeon: Ralene Ok, MD;  Location: [place];  Service: General;  Laterality: N/A;  ? NASAL SEPTUM SURGERY  2013  ? deviated septum  ? ORIF ANKLE FRACTURE Left 05/09/2018  ? Procedure: OPEN REDUCTION INTERNAL FIXATION LEFT LISFRANC FRACTURE/DISLOCATION;  Surgeon: Newt Minion, MD;  Location: [place];  Service: Orthopedics;  Laterality: Left;  ? POLYPECTOMY  04/30/2019  ? Procedure: POLYPECTOMY;  Surgeon: Juanita Craver, MD;  [facility];  Service: Endoscopy;;  ? TEE WITHOUT CARDIOVERSION N/A 06/29/2019  ? Procedure: TRANSESOPHAGEAL ECHOCARDIOGRAM (TEE);  Surgeon: Acie Fredrickson Wonda Cheng, MD;  Location[facility];  Service: Cardiovascular;  Laterality: N/A;  ? US ECHOCARDIOGRAPHY  02/2009  ? WNL, EF >55% Claiborne Billings)  ? US RENAL/AORTA Right 05/2009  ? 1-59% diameter reduction renal artery, rec rpt 2 yrs  ? VASECTOMY N/A   ? Phreesia 02/21/2020  ? WEIL OSTEOTOMY Left  11/16/2020  ? Procedure: WEIL OSTEOTOMY LEFT 2ND METATARSAL,  2ND TOE DISTAL INTERPHALANGEAL RESECTION AND [name] OSTEOTOMY WEIL OSTEOTOMY THIRD METATARSAL;  Surgeon: Newt Minion, MD;  Location: [place];  Imagene Riches

## 2021-04-06 ENCOUNTER — Encounter: Payer: Self-pay | Admitting: Physician Assistant

## 2021-04-06 ENCOUNTER — Ambulatory Visit (INDEPENDENT_AMBULATORY_CARE_PROVIDER_SITE_OTHER): Payer: BC Managed Care – PPO | Admitting: Physician Assistant

## 2021-04-06 VITALS — BP 124/69 | HR 55 | Ht 71.0 in | Wt 195.8 lb

## 2021-04-06 DIAGNOSIS — H02831 Dermatochalasis of right upper eyelid: Secondary | ICD-10-CM

## 2021-04-06 DIAGNOSIS — H02834 Dermatochalasis of left upper eyelid: Secondary | ICD-10-CM

## 2021-04-06 MED ORDER — ONDANSETRON 4 MG PO TBDP: 4.0000 mg | ORAL_TABLET | Freq: Three times a day (TID) | ORAL | 0 refills | Status: DC | PRN

## 2021-04-06 MED ORDER — OXYCODONE HCL 5 MG PO TABS: 5.0000 mg | ORAL_TABLET | Freq: Four times a day (QID) | ORAL | 0 refills | Status: AC | PRN

## 2021-04-10 NOTE — Telephone Encounter (Signed)
OV note and clearance form have been faxed back to [facility]. Nothing further needed. ?

## 2021-04-11 ENCOUNTER — Encounter (HOSPITAL_BASED_OUTPATIENT_CLINIC_OR_DEPARTMENT_OTHER)
Admission: RE | Admit: 2021-04-11 | Discharge: 2021-04-11 | Disposition: A | Discharge status: Home / Self Care | Payer: BC Managed Care – PPO | Source: Ambulatory Visit | Attending: Plastic Surgery | Admitting: Plastic Surgery

## 2021-04-11 DIAGNOSIS — K219 Gastro-esophageal reflux disease without esophagitis: Secondary | ICD-10-CM | POA: Diagnosis not present

## 2021-04-11 DIAGNOSIS — Z01812 Encounter for preprocedural laboratory examination: Secondary | ICD-10-CM | POA: Insufficient documentation

## 2021-04-11 DIAGNOSIS — M419 Scoliosis, unspecified: Secondary | ICD-10-CM | POA: Diagnosis not present

## 2021-04-11 DIAGNOSIS — I10 Essential (primary) hypertension: Secondary | ICD-10-CM | POA: Diagnosis not present

## 2021-04-11 DIAGNOSIS — J9612 Chronic respiratory failure with hypercapnia: Secondary | ICD-10-CM | POA: Diagnosis not present

## 2021-04-11 DIAGNOSIS — E871 Hypo-osmolality and hyponatremia: Secondary | ICD-10-CM | POA: Insufficient documentation

## 2021-04-11 DIAGNOSIS — H02831 Dermatochalasis of right upper eyelid: Secondary | ICD-10-CM | POA: Diagnosis not present

## 2021-04-11 DIAGNOSIS — G4733 Obstructive sleep apnea (adult) (pediatric): Secondary | ICD-10-CM | POA: Diagnosis not present

## 2021-04-11 DIAGNOSIS — J984 Other disorders of lung: Secondary | ICD-10-CM | POA: Diagnosis not present

## 2021-04-11 DIAGNOSIS — I739 Peripheral vascular disease, unspecified: Secondary | ICD-10-CM | POA: Diagnosis not present

## 2021-04-11 DIAGNOSIS — Z79899 Other long term (current) drug therapy: Secondary | ICD-10-CM | POA: Diagnosis not present

## 2021-04-11 DIAGNOSIS — H02834 Dermatochalasis of left upper eyelid: Secondary | ICD-10-CM | POA: Diagnosis not present

## 2021-04-11 DIAGNOSIS — J9611 Chronic respiratory failure with hypoxia: Secondary | ICD-10-CM | POA: Diagnosis not present

## 2021-04-11 LAB — BASIC METABOLIC PANEL
Anion gap: 5 (ref 5–15)
BUN: 13 mg/dL (ref 8–23)
CO2: 30 mmol/L (ref 22–32)
Calcium: 9.4 mg/dL (ref 8.9–10.3)
Chloride: 102 mmol/L (ref 98–111)
Creatinine, Ser: 0.82 mg/dL (ref 0.61–1.24)
GFR, Estimated: >60 mL/min (ref >60)
Glucose, Bld: 106 mg/dL — ABNORMAL HIGH (ref 70–99)
Potassium: 4.5 mmol/L (ref 3.5–5.1)
Sodium: 137 mmol/L (ref 135–145)

## 2021-04-11 MED ORDER — CHLORHEXIDINE GLUCONATE CLOTH 2 % EX PADS: 6.0000 | MEDICATED_PAD | Freq: Once | CUTANEOUS | Status: DC

## 2021-04-11 NOTE — Progress Notes (Signed)
LVM with patient to come in for pre op labs/ekg  ?

## 2021-04-12 ENCOUNTER — Encounter (HOSPITAL_BASED_OUTPATIENT_CLINIC_OR_DEPARTMENT_OTHER): Payer: Self-pay | Admitting: Plastic Surgery

## 2021-04-12 ENCOUNTER — Ambulatory Visit (HOSPITAL_BASED_OUTPATIENT_CLINIC_OR_DEPARTMENT_OTHER)
Admission: RE | Admit: 2021-04-12 | Discharge: 2021-04-12 | Disposition: A | Discharge status: Home / Self Care | Payer: BC Managed Care – PPO | Attending: Plastic Surgery | Admitting: Plastic Surgery

## 2021-04-12 ENCOUNTER — Ambulatory Visit (HOSPITAL_BASED_OUTPATIENT_CLINIC_OR_DEPARTMENT_OTHER): Payer: BC Managed Care – PPO | Admitting: Anesthesiology

## 2021-04-12 ENCOUNTER — Encounter (HOSPITAL_BASED_OUTPATIENT_CLINIC_OR_DEPARTMENT_OTHER)
Admission: RE | Disposition: A | Discharge status: Home / Self Care | Payer: Self-pay | Source: Home / Self Care | Attending: Plastic Surgery

## 2021-04-12 ENCOUNTER — Other Ambulatory Visit: Payer: Self-pay

## 2021-04-12 DIAGNOSIS — G4733 Obstructive sleep apnea (adult) (pediatric): Secondary | ICD-10-CM | POA: Diagnosis not present

## 2021-04-12 DIAGNOSIS — E871 Hypo-osmolality and hyponatremia: Secondary | ICD-10-CM | POA: Insufficient documentation

## 2021-04-12 DIAGNOSIS — J9611 Chronic respiratory failure with hypoxia: Secondary | ICD-10-CM | POA: Diagnosis not present

## 2021-04-12 DIAGNOSIS — K219 Gastro-esophageal reflux disease without esophagitis: Secondary | ICD-10-CM | POA: Insufficient documentation

## 2021-04-12 DIAGNOSIS — M419 Scoliosis, unspecified: Secondary | ICD-10-CM | POA: Diagnosis not present

## 2021-04-12 DIAGNOSIS — H02831 Dermatochalasis of right upper eyelid: Secondary | ICD-10-CM | POA: Diagnosis not present

## 2021-04-12 DIAGNOSIS — J984 Other disorders of lung: Secondary | ICD-10-CM | POA: Insufficient documentation

## 2021-04-12 DIAGNOSIS — Z79899 Other long term (current) drug therapy: Secondary | ICD-10-CM | POA: Diagnosis not present

## 2021-04-12 DIAGNOSIS — J9612 Chronic respiratory failure with hypercapnia: Secondary | ICD-10-CM | POA: Insufficient documentation

## 2021-04-12 DIAGNOSIS — I10 Essential (primary) hypertension: Secondary | ICD-10-CM | POA: Diagnosis not present

## 2021-04-12 DIAGNOSIS — I739 Peripheral vascular disease, unspecified: Secondary | ICD-10-CM | POA: Diagnosis not present

## 2021-04-12 DIAGNOSIS — H02834 Dermatochalasis of left upper eyelid: Secondary | ICD-10-CM | POA: Diagnosis not present

## 2021-04-12 DIAGNOSIS — J449 Chronic obstructive pulmonary disease, unspecified: Secondary | ICD-10-CM | POA: Diagnosis not present

## 2021-04-12 HISTORY — PX: BROW LIFT: SHX178

## 2021-04-12 SURGERY — BLEPHAROPLASTY
Anesthesia: General | Site: Eye | Laterality: Bilateral

## 2021-04-12 MED ORDER — FENTANYL CITRATE (PF) 100 MCG/2ML IJ SOLN: 25.0000 ug | INTRAMUSCULAR | Status: DC | PRN

## 2021-04-12 MED ORDER — SODIUM CHLORIDE 0.9 % IV SOLN: 250.0000 mL | INTRAVENOUS | Status: DC | PRN

## 2021-04-12 MED ORDER — BACITRACIN-POLYMYXIN B 500-10000 UNIT/GM OP OINT
TOPICAL_OINTMENT | OPHTHALMIC | Status: DC | PRN
  Administered 2021-04-12: 1 via OPHTHALMIC

## 2021-04-12 MED ORDER — MIDAZOLAM HCL 2 MG/2ML IJ SOLN
INTRAMUSCULAR | Status: AC
  Filled 2021-04-12: qty 2

## 2021-04-12 MED ORDER — CEFAZOLIN SODIUM-DEXTROSE 2-4 GM/100ML-% IV SOLN
2.0000 g | INTRAVENOUS | Status: AC
  Administered 2021-04-12: 2 g via INTRAVENOUS

## 2021-04-12 MED ORDER — TOBRAMYCIN-DEXAMETHASONE 0.3-0.1 % OP OINT
TOPICAL_OINTMENT | OPHTHALMIC | Status: AC
  Filled 2021-04-12: qty 3.5

## 2021-04-12 MED ORDER — FENTANYL CITRATE (PF) 100 MCG/2ML IJ SOLN
INTRAMUSCULAR | Status: AC
  Filled 2021-04-12: qty 2

## 2021-04-12 MED ORDER — LIDOCAINE HCL (CARDIAC) PF 100 MG/5ML IV SOSY
PREFILLED_SYRINGE | INTRAVENOUS | Status: DC | PRN
  Administered 2021-04-12: 100 mg via INTRAVENOUS

## 2021-04-12 MED ORDER — ATROPINE SULFATE 0.4 MG/ML IV SOLN
INTRAVENOUS | Status: AC
  Filled 2021-04-12: qty 1

## 2021-04-12 MED ORDER — DEXAMETHASONE SODIUM PHOSPHATE 4 MG/ML IJ SOLN
INTRAMUSCULAR | Status: DC | PRN
  Administered 2021-04-12: 10 mg via INTRAVENOUS

## 2021-04-12 MED ORDER — EPHEDRINE 5 MG/ML INJ
INTRAVENOUS | Status: AC
  Filled 2021-04-12: qty 5

## 2021-04-12 MED ORDER — PHENYLEPHRINE HCL-NACL 20-0.9 MG/250ML-% IV SOLN
INTRAVENOUS | Status: DC | PRN
  Administered 2021-04-12: 40 ug/min via INTRAVENOUS

## 2021-04-12 MED ORDER — PHENYLEPHRINE HCL (PRESSORS) 10 MG/ML IV SOLN
INTRAVENOUS | Status: DC | PRN
  Administered 2021-04-12: 80 ug via INTRAVENOUS

## 2021-04-12 MED ORDER — LIDOCAINE-EPINEPHRINE 1 %-1:100000 IJ SOLN
INTRAMUSCULAR | Status: DC | PRN
Start: 2021-04-12 — End: 2021-04-12
  Administered 2021-04-12: 6 mL via INTRADERMAL

## 2021-04-12 MED ORDER — POVIDONE-IODINE 5 % OP SOLN
OPHTHALMIC | Status: AC
Start: 2021-04-12 — End: ?
  Filled 2021-04-12: qty 30

## 2021-04-12 MED ORDER — ONDANSETRON HCL 4 MG/2ML IJ SOLN
INTRAMUSCULAR | Status: DC | PRN
  Administered 2021-04-12: 4 mg via INTRAVENOUS

## 2021-04-12 MED ORDER — BACITRACIN-POLYMYXIN B 500-10000 UNIT/GM OP OINT
TOPICAL_OINTMENT | OPHTHALMIC | Status: AC
  Filled 2021-04-12: qty 3.5

## 2021-04-12 MED ORDER — DROPERIDOL 2.5 MG/ML IJ SOLN
INTRAMUSCULAR | Status: DC | PRN
  Administered 2021-04-12: .625 mg via INTRAVENOUS

## 2021-04-12 MED ORDER — ACETAMINOPHEN 325 MG RE SUPP: 650.0000 mg | RECTAL | Status: DC | PRN

## 2021-04-12 MED ORDER — LIDOCAINE-EPINEPHRINE 1 %-1:100000 IJ SOLN
INTRAMUSCULAR | Status: AC
  Filled 2021-04-12: qty 1

## 2021-04-12 MED ORDER — CEFAZOLIN SODIUM-DEXTROSE 2-4 GM/100ML-% IV SOLN
INTRAVENOUS | Status: AC
  Filled 2021-04-12: qty 100

## 2021-04-12 MED ORDER — OXYCODONE HCL 5 MG PO TABS: 5.0000 mg | ORAL_TABLET | ORAL | Status: DC | PRN

## 2021-04-12 MED ORDER — DEXAMETHASONE SODIUM PHOSPHATE 10 MG/ML IJ SOLN
INTRAMUSCULAR | Status: AC
  Filled 2021-04-12: qty 1

## 2021-04-12 MED ORDER — SODIUM CHLORIDE 0.9% FLUSH: 3.0000 mL | Freq: Two times a day (BID) | INTRAVENOUS | Status: DC

## 2021-04-12 MED ORDER — LIDOCAINE 2% (20 MG/ML) 5 ML SYRINGE
INTRAMUSCULAR | Status: AC
  Filled 2021-04-12: qty 5

## 2021-04-12 MED ORDER — BSS IO SOLN
INTRAOCULAR | Status: AC
  Filled 2021-04-12: qty 15

## 2021-04-12 MED ORDER — EPHEDRINE SULFATE (PRESSORS) 50 MG/ML IJ SOLN
INTRAMUSCULAR | Status: DC | PRN
  Administered 2021-04-12 (×2): 20 mg via INTRAVENOUS

## 2021-04-12 MED ORDER — ACETAMINOPHEN 325 MG PO TABS: 650.0000 mg | ORAL_TABLET | ORAL | Status: DC | PRN

## 2021-04-12 MED ORDER — OXYCODONE HCL 5 MG PO TABS
ORAL_TABLET | ORAL | Status: AC
  Filled 2021-04-12: qty 1

## 2021-04-12 MED ORDER — SODIUM CHLORIDE 0.9% FLUSH: 3.0000 mL | INTRAVENOUS | Status: DC | PRN

## 2021-04-12 MED ORDER — PROPOFOL 10 MG/ML IV BOLUS
INTRAVENOUS | Status: DC | PRN
  Administered 2021-04-12: 200 mg via INTRAVENOUS

## 2021-04-12 MED ORDER — ONDANSETRON HCL 4 MG/2ML IJ SOLN: 4.0000 mg | Freq: Once | INTRAMUSCULAR | Status: DC | PRN

## 2021-04-12 MED ORDER — OXYCODONE HCL 5 MG PO TABS
5.0000 mg | ORAL_TABLET | Freq: Once | ORAL | Status: AC | PRN
  Administered 2021-04-12: 5 mg via ORAL

## 2021-04-12 MED ORDER — PHENYLEPHRINE 40 MCG/ML (10ML) SYRINGE FOR IV PUSH (FOR BLOOD PRESSURE SUPPORT)
PREFILLED_SYRINGE | INTRAVENOUS | Status: AC
  Filled 2021-04-12: qty 10

## 2021-04-12 MED ORDER — ONDANSETRON HCL 4 MG/2ML IJ SOLN
INTRAMUSCULAR | Status: AC
  Filled 2021-04-12: qty 2

## 2021-04-12 MED ORDER — SUCCINYLCHOLINE CHLORIDE 200 MG/10ML IV SOSY
PREFILLED_SYRINGE | INTRAVENOUS | Status: AC
Start: 2021-04-12 — End: ?
  Filled 2021-04-12: qty 10

## 2021-04-12 MED ORDER — LACTATED RINGERS IV SOLN: INTRAVENOUS | Status: DC

## 2021-04-12 MED ORDER — BSS IO SOLN
INTRAOCULAR | Status: DC | PRN
Start: 2021-04-12 — End: 2021-04-12
  Administered 2021-04-12: 15 mL

## 2021-04-12 MED ORDER — OXYCODONE HCL 5 MG/5ML PO SOLN: 5.0000 mg | Freq: Once | ORAL | Status: AC | PRN

## 2021-04-12 MED ORDER — FENTANYL CITRATE (PF) 100 MCG/2ML IJ SOLN
INTRAMUSCULAR | Status: DC | PRN
  Administered 2021-04-12: 25 ug via INTRAVENOUS
  Administered 2021-04-12: 50 ug via INTRAVENOUS

## 2021-04-12 MED ORDER — ACETAMINOPHEN 10 MG/ML IV SOLN: 1000.0000 mg | Freq: Once | INTRAVENOUS | Status: DC | PRN

## 2021-04-12 MED ORDER — MIDAZOLAM HCL 5 MG/5ML IJ SOLN
INTRAMUSCULAR | Status: DC | PRN
  Administered 2021-04-12: 2 mg via INTRAVENOUS

## 2021-04-12 SURGICAL SUPPLY — 26 items
BLADE SURG 15 STRL LF DISP TIS (BLADE) ×1 IMPLANT
BLADE SURG 15 STRL SS (BLADE) ×2
BNDG EYE OVAL (GAUZE/BANDAGES/DRESSINGS) ×4 IMPLANT
CORD BIPOLAR FORCEPS 12FT (ELECTRODE) ×2 IMPLANT
COVER BACK TABLE 60X90IN (DRAPES) ×2 IMPLANT
COVER MAYO STAND STRL (DRAPES) ×2 IMPLANT
DRAPE U-SHAPE 76X120 STRL (DRAPES) ×2 IMPLANT
ELECT NDL BLADE 2-5/6 (NEEDLE) IMPLANT
ELECT NEEDLE BLADE 2-5/6 (NEEDLE) ×2 IMPLANT
ELECT REM PT RETURN 9FT ADLT (ELECTROSURGICAL) ×2
ELECTRODE REM PT RTRN 9FT ADLT (ELECTROSURGICAL) IMPLANT
GAUZE SPONGE 4X4 12PLY STRL LF (GAUZE/BANDAGES/DRESSINGS) ×2 IMPLANT
GLOVE BIO SURGEON STRL SZ 6.5 (GLOVE) ×4 IMPLANT
GOWN STRL REUS W/ TWL LRG LVL3 (GOWN DISPOSABLE) ×2 IMPLANT
GOWN STRL REUS W/TWL LRG LVL3 (GOWN DISPOSABLE) ×6
NDL HYPO 30GX1 BEV (NEEDLE) IMPLANT
NEEDLE HYPO 30GX1 BEV (NEEDLE) ×2 IMPLANT
PACK BASIN DAY SURGERY FS (CUSTOM PROCEDURE TRAY) ×2 IMPLANT
PENCIL SMOKE EVACUATOR (MISCELLANEOUS) ×1 IMPLANT
SLEEVE SCD COMPRESS KNEE MED (STOCKING) ×1 IMPLANT
SPEAR EYE SURG WECK-CEL (MISCELLANEOUS) ×1 IMPLANT
SUT MNCRL 6-0 UNDY P1 1X18 (SUTURE) IMPLANT
SUT MONOCRYL 6-0 P1 1X18 (SUTURE) ×4
SYR CONTROL 10ML LL (SYRINGE) ×1 IMPLANT
TOWEL GREEN STERILE FF (TOWEL DISPOSABLE) ×4 IMPLANT
TRAY DSU PREP LF (CUSTOM PROCEDURE TRAY) ×2 IMPLANT

## 2021-04-12 NOTE — Interval H&P Note (Signed)
History and Physical Interval Note: ? ?04/12/2021 ?11:30 AM ? ?Louis [name]  has presented today for surgery, with the diagnosis of Dermatochalasis of both upper eyelids.  The various methods of treatment have been discussed with the patient and family. After consideration of risks, benefits and other options for treatment, the patient has consented to  Procedure(s): ?UPPER LID BLEPHAROPLASTY (Bilateral) as a surgical intervention.  The patient's history has been reviewed, patient examined, no change in status, stable for surgery.  I have reviewed the patient's chart and labs.  Questions were answered to the patient's satisfaction.   ? ? ?[name] ? ? ?

## 2021-04-12 NOTE — Anesthesia Procedure Notes (Signed)
Procedure Name: LMA Insertion ?Date/Time: 04/12/2021 12:07 PM ?Performed by: Willa Frater, CRNA ?Pre-anesthesia Checklist: Patient identified, Emergency Drugs available, Suction available and Patient being monitored ?Patient Re-evaluated:Patient Re-evaluated prior to induction ?Oxygen Delivery Method: Circle system utilized ?Preoxygenation: Pre-oxygenation with 100% oxygen ?Induction Type: IV induction ?Ventilation: Mask ventilation without difficulty ?LMA: LMA flexible inserted ?LMA Size: 5.0 ?Number of attempts: 1 ?Airway Equipment and Method: Bite block ?Placement Confirmation: positive ETCO2 ?Tube secured with: Tape ?Dental Injury: Teeth and Oropharynx as per pre-operative assessment  ? ? ? ? ?

## 2021-04-12 NOTE — Discharge Instructions (Addendum)
Keep head elevated as able. ?Ice as able for next 24 hours. ?No heavy lifting. ? ?Oxycodone given at 2:10pm ? ?Post Anesthesia Home Care Instructions ? ?Activity: ?Get plenty of rest for the remainder of the day. A responsible individual must stay with you for 24 hours following the procedure.  ?For the next 24 hours, DO NOT: ?-Drive a car ?-Paediatric nurse ?-Drink alcoholic beverages ?-Take any medication unless instructed by your physician ?-Make any legal decisions or sign important papers. ? ?Meals: ?Start with liquid foods such as gelatin or soup. Progress to regular foods as tolerated. Avoid greasy, spicy, heavy foods. If nausea and/or vomiting occur, drink only clear liquids until the nausea and/or vomiting subsides. Call your physician if vomiting continues. ? ?Special Instructions/Symptoms: ?Your throat may feel dry or sore from the anesthesia or the breathing tube placed in your throat during surgery. If this causes discomfort, gargle with warm salt water. The discomfort should disappear within 24 hours. ? ?If you had a scopolamine patch placed behind your ear for the management of post- operative nausea and/or vomiting: ? ?1. The medication in the patch is effective for 72 hours, after which it should be removed.  Wrap patch in a tissue and discard in the trash. Wash hands thoroughly with soap and water. ?2. You may remove the patch earlier than 72 hours if you experience unpleasant side effects which may include dry mouth, dizziness or visual disturbances. ?3. Avoid touching the patch. Wash your hands with soap and water after contact with the patch. ?    ?

## 2021-04-12 NOTE — Transfer of Care (Signed)
Immediate Anesthesia Transfer of Care Note ? ?Patient: Louis [name] ? ?Procedure(s) Performed: UPPER LID BLEPHAROPLASTY (Bilateral: Eye) ? ?Patient Location: PACU ? ?Anesthesia Type:General ? ?Level of Consciousness: drowsy, patient cooperative and responds to stimulation ? ?Airway & Oxygen Therapy: Patient Spontanous Breathing and Patient connected to face mask oxygen ? ?Post-op Assessment: Report given to RN and Post -op Vital signs reviewed and stable ? ?Post vital signs: Reviewed and stable ? ?Last Vitals:  ?Vitals Value Taken Time  ?BP    ?Temp    ?Pulse 67 04/12/21 1337  ?Resp 13 04/12/21 1337  ?SpO2 99 % 04/12/21 1337  ?Vitals shown include unvalidated device data. ? ?Last Pain:  ?Vitals:  ? 04/12/21 1026  ?TempSrc: Oral  ?PainSc: 0-No pain  ?   ? ?Patients Stated Pain Goal: 7 (04/12/21 1026) ? ?Complications: No notable events documented. ?

## 2021-04-12 NOTE — Op Note (Signed)
Operative Note ? ?Date of operation: 04/12/2021 ? ?Patient: Louis [name], MRN: [id number], 66 y.o. male.  ?Date of birth:  [birth year] ? ?Location: [facility] ? ?Preoperative Diagnosis: Redundant Upper Eyelid skin ? ?Postoperative Diagnosis: Same ? ?Procedure: Bilateral Upper eyelid blepharoplasty ? ?Surgeon: Theodoro Kos Duante Arocho, DO ? ?Assistant: Roetta Sessions, PA ? ?Condition: Stable ? ?Complications: None  ? ?EBL: Minimal ? ?Disposition: Recovery Room ? ?Indications: To prevent complications and improve visual field function.  Improve quality of life.  The patient presented with concerns regarding heavy upper eyelids with a decrease visual field.  This made it difficult with activities and reading.  It was better with manual elevation of the upper eyelids or extending the neck.  The clinical exam and tests confirmed the need for bilateral upper eyelid blepharoplasty.  Surgical risks and benefits were discussed in detail and are in the chart. ? ?Procedure in detail: ?Patient was seen on the morning of the procedure after anesthesia evaluation. The patient was marked in holding in the sitting and reclining position. All questions were answered.  The patient was then taken to the operating room.  Anesthesia was administered and a time out was called with all information confirmed to be correct.  The patient was prepped and draped with a betadine prep. The markings were confirmed.  Local anesthetic consisting of 1% lidocaine with 1:100,000 units of epinephrine. This was injected into the upper lids. The local was given 8 minutes to take effect. ? ?Attention was directed to the right upper lid.  The redundant upper eyelid was measured with 20 mm of upper eyelid skin to remain.  A # 15 blade was used to incise the upper eyelid skin.  A small strip of orbicularis muscle was taken at the crease.  Hemostasis was achieved with electrocautery.  The dissection was carried down past the orbicularis  and expose the fat.  A small amount of fat was cauterized in the medial compartment of the upper lid. The incisions were closed with 6-0 Monocryl as a running subcuticular and several interrupted sutures placed laterally. ? ?Left upper lid.  The redundant upper eyelid was measured with 20 mm of upper eyelid skin to remain.  A # 15 blade was used to incise the upper eyelid skin.  A small strip of orbicularis muscle was taken at the crease.  Hemostasis was achieved with electrocautery.  The dissection was carried down past the orbicularis and expose the fat.  A small amount of fat was cauterized in the medial compartment of the upper lid. The incisions were closed with 6-0 Monocryl as a running subcuticular and several interrupted sutures placed laterally.  The incisions were washed off and steristrips applied.  Ice was applied and patient was allowed to wake up and was taken to the recovery room with no apparent complications. Family was notified at the end of the case.  ? ?The advanced practice practitioner (APP) assisted throughout the case.  The APP was essential in retraction and counter traction when needed to make the case progress smoothly.  This retraction and assistance made it possible to see the tissue plans for the procedure.  The assistance was needed for blood control, tissue re-approximation and assisted with closure of the incision site. ? ? ?

## 2021-04-12 NOTE — Anesthesia Postprocedure Evaluation (Signed)
Anesthesia Post Note ? ?Patient: Kalab Camps ? ?Procedure(s) Performed: UPPER LID BLEPHAROPLASTY (Bilateral: Eye) ? ?  ? ?Patient location during evaluation: PACU ?Anesthesia Type: General ?Level of consciousness: awake and alert ?Pain management: pain level controlled ?Vital Signs Assessment: post-procedure vital signs reviewed and stable ?Respiratory status: spontaneous breathing, nonlabored ventilation, respiratory function stable and patient connected to nasal cannula oxygen ?Cardiovascular status: blood pressure returned to baseline and stable ?Postop Assessment: no apparent nausea or vomiting ?Anesthetic complications: no ? ? ?No notable events documented. ? ?Last Vitals:  ?Vitals:  ? 04/12/21 1400 04/12/21 1410  ?BP: 114/70 127/68  ?Pulse: 68 77  ?Resp: 15 14  ?Temp:  36.9 ?C  ?SpO2: 94% 95%  ?  ?Last Pain:  ?Vitals:  ? 04/12/21 1410  ?TempSrc: Temporal  ?PainSc: 3   ? ? ?  ?  ?  ?  ?  ?  ? ?Monaca Wadas S ? ? ? ? ?

## 2021-04-12 NOTE — Anesthesia Preprocedure Evaluation (Signed)
Anesthesia Evaluation  ?Patient identified by MRN, date of birth, ID band ?Patient awake ? ? ? ?Reviewed: ?Allergy & Precautions, NPO status , [name] Chart, lab work & pertinent test results ? ?Airway ?Mallampati: II ? ?TM Distance: >3 FB ?Neck ROM: Full ? ? ? Dental ?no notable dental hx. ? ?  ?Pulmonary ?asthma , COPD,  ?  ?Pulmonary exam normal ?breath sounds clear to auscultation ? ? ? ? ? ? Cardiovascular ?hypertension, Pt. on medications and Pt. on home beta blockers ?+ Peripheral Vascular Disease  ?Normal cardiovascular exam ?Rhythm:Regular Rate:Normal ? ? ?  ?Neuro/Psych ?negative neurological ROS ? negative psych ROS  ? GI/Hepatic ?GERD  Medicated,(+)  ?  ? substance abuse ? , Chronic narcotic use ?  ?Endo/Other  ?negative endocrine ROS ? Renal/GU ?negative Renal ROS  ?negative genitourinary ?  ?Musculoskeletal ?negative musculoskeletal ROS ?(+)  ? Abdominal ?  ?Peds ?negative pediatric ROS ?(+)  Hematology ?negative hematology ROS ?(+)   ?Anesthesia Other Findings ? ? Reproductive/Obstetrics ?negative OB ROS ? ?  ? ? ? ? ? ? ? ? ? ? ? ? ? ?  ?  ? ? ? ? ? ? ? ? ?Anesthesia Physical ?Anesthesia Plan ? ?ASA: 3 ? ?Anesthesia Plan: General  ? ?Post-op Pain Management: Minimal or no pain anticipated  ? ?Induction: Intravenous ? ?PONV Risk Score and Plan: 2 and Ondansetron, Dexamethasone and Treatment may vary due to age or medical condition ? ?Airway Management Planned: LMA ? ?Additional Equipment:  ? ?Intra-op Plan:  ? ?Post-operative Plan: Extubation in OR ? ?Informed Consent: I have reviewed the patients History and Physical, chart, labs and discussed the procedure including the risks, benefits and alternatives for the proposed anesthesia with the patient or authorized representative who has indicated his/her understanding and acceptance.  ? ? ? ?Dental advisory given ? ?Plan Discussed with: CRNA and Surgeon ? ?Anesthesia Plan Comments:   ? ? ? ? ? ? ?Anesthesia Quick  Evaluation ? ?

## 2021-04-13 ENCOUNTER — Encounter (HOSPITAL_BASED_OUTPATIENT_CLINIC_OR_DEPARTMENT_OTHER): Payer: Self-pay | Admitting: Plastic Surgery

## 2021-04-17 ENCOUNTER — Encounter: Payer: Self-pay | Admitting: Family Medicine

## 2021-04-21 ENCOUNTER — Ambulatory Visit (INDEPENDENT_AMBULATORY_CARE_PROVIDER_SITE_OTHER): Payer: BC Managed Care – PPO | Admitting: Surgical

## 2021-04-21 DIAGNOSIS — H02831 Dermatochalasis of right upper eyelid: Secondary | ICD-10-CM

## 2021-04-21 DIAGNOSIS — L989 Disorder of the skin and subcutaneous tissue, unspecified: Secondary | ICD-10-CM

## 2021-04-21 DIAGNOSIS — H02834 Dermatochalasis of left upper eyelid: Secondary | ICD-10-CM

## 2021-04-21 NOTE — Progress Notes (Signed)
66 year old male here for follow-up after bilateral upper eyelid blepharoplasty with Dr. Marla Roe on 03/12/2021.  He is doing really well.  He has minimal pain.  He has minimal swelling, no bruising.  Vision is much better, no vision complaints.  Reports he is able to see much better now. ? ?On exam bilateral upper eyelid incisions are intact, healing well.  He has some residual swelling noted, right greater than left.  No erythema or cellulitic changes noted. ? ?Patient has follow-up in 3 weeks with Dr. Marla Roe for reevaluation.  Monocryl suture knots laterally were clipped.  Patient tolerated this well.  No signs of infection. ? ?Call with questions or concerns ?Pictures were obtained of the patient and placed in the chart with the patient's or guardian's permission. ? ?

## 2021-04-21 NOTE — Addendum Note (Signed)
Addended by: Tresa Moore on: 04/21/2021 03:47 PM ? ? Modules accepted: Orders ? ?

## 2021-04-26 DIAGNOSIS — F329 Major depressive disorder, single episode, unspecified: Secondary | ICD-10-CM | POA: Diagnosis not present

## 2021-04-26 DIAGNOSIS — F1020 Alcohol dependence, uncomplicated: Secondary | ICD-10-CM | POA: Diagnosis not present

## 2021-05-04 DIAGNOSIS — J9601 Acute respiratory failure with hypoxia: Secondary | ICD-10-CM | POA: Diagnosis not present

## 2021-05-05 ENCOUNTER — Ambulatory Visit (INDEPENDENT_AMBULATORY_CARE_PROVIDER_SITE_OTHER): Payer: BC Managed Care – PPO | Admitting: Plastic Surgery

## 2021-05-05 DIAGNOSIS — H02834 Dermatochalasis of left upper eyelid: Secondary | ICD-10-CM

## 2021-05-05 DIAGNOSIS — H02831 Dermatochalasis of right upper eyelid: Secondary | ICD-10-CM

## 2021-05-05 DIAGNOSIS — M25569 Pain in unspecified knee: Secondary | ICD-10-CM | POA: Diagnosis not present

## 2021-05-05 NOTE — Progress Notes (Signed)
The patient is a 66 year old male here for follow-up after undergoing an upper lid blepharoplasty.  He said he had very little bruising and swelling.  He is doing really well and very happy with his result.  He notices a big difference with his visual field.  The suture knots were removed.  No sign of infection and no redness.  Follow-up as needed. ? ?Pictures were obtained of the patient and placed in the chart with the patient's or guardian's permission. ? ?

## 2021-05-08 ENCOUNTER — Ambulatory Visit: Payer: BC Managed Care – PPO | Admitting: Family Medicine

## 2021-05-08 ENCOUNTER — Other Ambulatory Visit: Payer: Self-pay | Admitting: Family Medicine

## 2021-05-08 DIAGNOSIS — E538 Deficiency of other specified B group vitamins: Secondary | ICD-10-CM

## 2021-05-08 DIAGNOSIS — G629 Polyneuropathy, unspecified: Secondary | ICD-10-CM

## 2021-05-18 ENCOUNTER — Ambulatory Visit: Payer: BC Managed Care – PPO | Admitting: Family Medicine

## 2021-05-19 DIAGNOSIS — M25569 Pain in unspecified knee: Secondary | ICD-10-CM | POA: Diagnosis not present

## 2021-05-24 ENCOUNTER — Ambulatory Visit: Payer: BC Managed Care – PPO | Admitting: Family Medicine

## 2021-05-24 DIAGNOSIS — F1020 Alcohol dependence, uncomplicated: Secondary | ICD-10-CM | POA: Diagnosis not present

## 2021-06-02 ENCOUNTER — Other Ambulatory Visit: Payer: BC Managed Care – PPO

## 2021-06-02 ENCOUNTER — Other Ambulatory Visit (INDEPENDENT_AMBULATORY_CARE_PROVIDER_SITE_OTHER): Payer: BC Managed Care – PPO

## 2021-06-02 DIAGNOSIS — M25569 Pain in unspecified knee: Secondary | ICD-10-CM | POA: Diagnosis not present

## 2021-06-02 DIAGNOSIS — E871 Hypo-osmolality and hyponatremia: Secondary | ICD-10-CM | POA: Diagnosis not present

## 2021-06-02 LAB — BASIC METABOLIC PANEL
BUN: 17 mg/dL (ref 6–23)
CO2: 31 mEq/L (ref 19–32)
Calcium: 9.6 mg/dL (ref 8.4–10.5)
Chloride: 98 mEq/L (ref 96–112)
Creatinine, Ser: 0.82 mg/dL (ref 0.40–1.50)
GFR: 93.21 mL/min (ref >60.00)
Glucose, Bld: 93 mg/dL (ref 70–99)
Potassium: 3.9 mEq/L (ref 3.5–5.1)
Sodium: 135 mEq/L (ref 135–145)

## 2021-06-04 DIAGNOSIS — J9601 Acute respiratory failure with hypoxia: Secondary | ICD-10-CM | POA: Diagnosis not present

## 2021-06-05 ENCOUNTER — Ambulatory Visit: Payer: BC Managed Care – PPO | Admitting: Podiatry

## 2021-06-06 ENCOUNTER — Ambulatory Visit (INDEPENDENT_AMBULATORY_CARE_PROVIDER_SITE_OTHER): Payer: BC Managed Care – PPO | Admitting: Family Medicine

## 2021-06-06 ENCOUNTER — Encounter: Payer: Self-pay | Admitting: Family Medicine

## 2021-06-06 VITALS — BP 120/76 | HR 51 | Temp 98.0°F | Resp 15 | Ht 71.0 in | Wt 192.6 lb

## 2021-06-06 DIAGNOSIS — E871 Hypo-osmolality and hyponatremia: Secondary | ICD-10-CM

## 2021-06-06 DIAGNOSIS — I10 Essential (primary) hypertension: Secondary | ICD-10-CM | POA: Diagnosis not present

## 2021-06-06 NOTE — Progress Notes (Signed)
Subjective:  [name], male    DOB: 02/10/1955  Age: 66 y.o. MRN: [id number]  CC:  Chief Complaint  Patient presents with   Hypertension    Pt restarted HCTZ in April post eye lid surgery, had stopped intitially due to labs and labs did return to normal but pt notes he needs the HCTZ     HPI Louis [name] presents for   Hypertension: Upper lid blepharoplasty in April.   Previously use hydrochlorothiazide, thought to be contributing to hyponatremia, discontinued with improved sodium in April.   Treated with Toprol-XL 50 mg daily, losartan 100 mg daily, amlodipine 10 mg daily. Hctz was helping with ankle swelling, worse ankle swelling off meds.  Restarted HCTZ in April d/t ankle swelling. Better on meds.  Sodium stable at 135 last on 6/2.  Home readings stable. 120/70's.  On CPAP for OSA. Did not qualify for inspire, min OSA.  BP Readings from Last 3 Encounters:  06/06/21 120/76  04/12/21 127/68  04/06/21 124/69   Lab Results  Component Value Date   CREATININE 0.82 06/02/2021     History Patient Active Problem List   Diagnosis Date Noted   Dermatochalasis 02/10/2021   Ectropion 02/10/2021   Claw toe, acquired, left    Bunion of great toe of left foot    Skin lesion of face 06/14/2020   Unilateral primary osteoarthritis, right knee 01/06/2020   S/P hernia repair 10/15/2019   Neuropathy 07/20/2019   Macrocytic anemia    Debility 07/07/2019   Atelectasis    Enterococcal bacteremia 06/26/2019   Perforated viscus 01/06/2019   Onychomycosis 05/19/2018   Foot fracture, left, closed, initial encounter 05/09/2018   Lisfranc dislocation, left, initial encounter    Primary pulmonary hypertension ([place]) 94/76/5465   Diastolic dysfunction 03/54/6568   OSA (obstructive sleep apnea) 07/08/2017   SOB (shortness of breath) 07/02/2017   Chronic respiratory failure with hypoxia and hypercapnia ([place]) 07/02/2017   Cardiomegaly 07/02/2017   Exposure  keratitis 02/17/2017   Pedal edema 12/18/2016   Transaminitis 12/18/2016   Bilateral pes planus 12/18/2016   Psoriasis-eczema overlap condition 07/30/2016   High serum high density lipoprotein (HDL) 09/26/2015   Epistaxis 09/26/2015   Renal artery stenosis in 1 of 2 vessels (HCC)    Essential tremor    Elevated PSA    GERD (gastroesophageal reflux disease)    Health maintenance examination 10/08/2014   Anxiety state 10/08/2014   Obesity, Class I, BMI 30-34.9    Mild intermittent asthma in adult without complication    Seasonal allergies    HTN (hypertension)    Scoliosis    Osteoporosis    Past Medical History:  Diagnosis Date   Alcohol use disorder, mild, abuse    Anemia    Anxiety    situational   Arthritis    Phreesia 02/21/2020   Asthma    Phreesia 02/21/2020   BPH (benign prostatic hyperplasia)    on flomax- Patien tand wife denies- and patient has never been on Flomax   Bundle branch block    per prior PCP records   CHF (congestive heart failure) ([place])    Chronic kidney disease    Acute Renal Failure- June 2021- admission- kidneys recovered quickly   Complication of anesthesia 03/11/2019   woke up before surgery was over- "paralytic"   COPD (chronic obstructive pulmonary disease) ([place])    Diverticulosis    by colonoscopy   Eczema    per prior PCP records  Elevated PSA 2015   peaked 6s, s/p benign biopsy 2014, sees urology [name])   Essential tremor    GERD (gastroesophageal reflux disease)    per prior PCP records   History of panic attacks    per prior PCP records   HTN (hypertension)    Hypertension    Phreesia 02/21/2020   Mild intermittent asthma in adult without complication    Obesity, Class I, BMI 30-34.9    Osteoporosis    DEXA 06/2013 with osteopenia - h/o ?wrist/hip fracture from AVN from chronic steroid use (asthma), took reclast for 1 year   Pneumonia    10/08/19 >10 years ago   Seasonal allergies    Sleep apnea    Substance  abuse ([place])    Phreesia 02/21/2020   Thoracic scoliosis childhood   Transaminitis    per prior PCP records   Vitamin B 12 deficiency    Past Surgical History:  Procedure Laterality Date   APPLICATION OF WOUND VAC Left 05/09/2018   Procedure: Application Of Wound Vac;  Surgeon: Newt Minion, MD;  Location: [facility];  Service: Orthopedics;  Laterality: Left;   BIOPSY  04/30/2019   Procedure: BIOPSY;  Surgeon: Juanita Craver, MD;  Location: [facility];  Service: Endoscopy;;   BROW LIFT Bilateral 04/12/2021   Procedure: UPPER LID BLEPHAROPLASTY;  Surgeon: Wallace Going, DO;  Location: [facility];  Service: Plastics;  Laterality: Bilateral;   COLONOSCOPY  12/2013   polyps, int hem, diverticulosis, rpt 5 yrs [name])   COLONOSCOPY WITH PROPOFOL N/A 04/30/2019   Procedure: COLONOSCOPY WITH PROPOFOL;  Surgeon: Juanita Craver, MD;  Location: [facility];  Service: Endoscopy;  Laterality: N/A;   ELBOW SURGERY Right 2008   golfer's elbow   ESOPHAGOGASTRODUODENOSCOPY (EGD) WITH PROPOFOL N/A 04/30/2019   Procedure: ESOPHAGOGASTRODUODENOSCOPY (EGD) WITH PROPOFOL;  Surgeon: Juanita Craver, MD;  [facility];  Service: Endoscopy;  Laterality: N/A;   HERNIA REPAIR N/A    Phreesia 02/21/2020   INCISIONAL HERNIA REPAIR N/A 10/15/2019   Procedure: HERNIA REPAIR INCISIONAL WITH MESH, TAR;  Surgeon: Ralene Ok, MD;  Location: [facility];  Service: General;  Laterality: N/A;   INGUINAL HERNIA REPAIR Bilateral childhood & ~1995   LAPAROTOMY N/A 01/05/2019   Procedure: EXPLORATORY LAPAROTOMY graham patch repair;  Surgeon: Benjamine Sprague, DO;  Location: [facility];  Service: General;  Laterality: N/A;   LAPAROTOMY N/A 10/15/2019   Procedure: EXPLORATORY LAPAROTOMY;  Surgeon: Ralene Ok, MD;  Location: [facility];  Service: General;  Laterality: N/A;   LYSIS OF ADHESION N/A 10/15/2019   Procedure: LYSIS OF ADHESION;  Surgeon: Ralene Ok, MD;  Location: [facility];  Service: General;   Laterality: N/A;   NASAL SEPTUM SURGERY  2013   deviated septum   ORIF ANKLE FRACTURE Left 05/09/2018   Procedure: OPEN REDUCTION INTERNAL FIXATION LEFT LISFRANC FRACTURE/DISLOCATION;  Surgeon: Newt Minion, MD;  Location: [facility];  Service: Orthopedics;  Laterality: Left;   POLYPECTOMY  04/30/2019   Procedure: POLYPECTOMY;  Surgeon: Juanita Craver, MD;  [facility];  Service: Endoscopy;;   TEE WITHOUT CARDIOVERSION N/A 06/29/2019   Procedure: TRANSESOPHAGEAL ECHOCARDIOGRAM (TEE);  Surgeon: Acie Fredrickson Wonda Cheng, MD;  Location[facility];  Service: Cardiovascular;  Laterality: N/A;   US ECHOCARDIOGRAPHY  02/2009   WNL, EF >55% [facility])   US RENAL/AORTA Right 05/2009   1-59% diameter reduction renal artery, rec rpt 2 yrs   VASECTOMY N/A    Phreesia 02/21/2020   WEIL OSTEOTOMY Left 11/16/2020  Procedure: WEIL OSTEOTOMY LEFT 2ND METATARSAL,  2ND TOE DISTAL INTERPHALANGEAL RESECTION AND [name] OSTEOTOMY WEIL OSTEOTOMY THIRD METATARSAL;  Surgeon: Newt Minion, MD;  Location: [facility];  Service: Orthopedics;  Laterality: Left;   Allergies  Allergen Reactions   Accupril [Quinapril Hcl] Cough   Nsaids     Perforated gastric ulcer   Prior to Admission medications   Medication Sig Start Date End Date Taking? Authorizing Provider  acetaminophen (TYLENOL) 500 MG tablet Take 500 mg by mouth every 6 (six) hours as needed for mild pain.   Yes [provider]  amLODipine (NORVASC) 10 MG tablet TAKE 1 TABLET(10 MG) BY MOUTH DAILY 02/02/21  Yes Wendie Agreste, MD  diclofenac Sodium (VOLTAREN) 1 % GEL Apply 1 application topically 4 (four) times daily as needed (knee pain).   Yes [provider]  gabapentin (NEURONTIN) 300 MG capsule TAKE 1 CAPSULE(300 MG) BY MOUTH THREE TIMES DAILY 05/08/21  Yes Wendie Agreste, MD  losartan (COZAAR) 100 MG tablet TAKE 1 TABLET(100 MG) BY MOUTH DAILY 02/02/21  Yes Wendie Agreste, MD  metoprolol succinate (TOPROL-XL) 50 MG 24 hr tablet TAKE 1 TABLET(50  MG) BY MOUTH DAILY WITH A MEAL 02/02/21  Yes Wendie Agreste, MD  Multiple Vitamin (MULTIVITAMIN WITH MINERALS) TABS tablet Take 1 tablet by mouth daily. 07/08/19  Yes Dahal, Marlowe Aschoff, MD  olopatadine (PATANOL) 0.1 % ophthalmic solution Place 1 drop into both eyes 2 (two) times daily as needed for allergies.   Yes [provider]  omeprazole (PRILOSEC) 20 MG capsule Take 20 mg by mouth daily.   Yes [provider]   Social History   Socioeconomic History   Marital status: Married    Spouse name: Not on file   Number of children: Not on file   Years of education: Not on file   Highest education level: Not on file  Occupational History   Not on file  Tobacco Use   Smoking status: Never   Smokeless tobacco: Never  Vaping Use   Vaping Use: Never used  Substance and Sexual Activity   Alcohol use: Not Currently   Drug use: No   Sexual activity: Yes    Birth control/protection: None  Other Topics Concern   Not on file  Social History Narrative   Lives with wife (retired Materials engineer), 49yo dog died 27-Mar-2014   Grown children   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  Eyes:  Negative for visual disturbance.  Respiratory:  Negative for cough, chest tightness and shortness of breath.   Cardiovascular:  Negative for chest pain, palpitations and leg swelling.  Gastrointestinal:  Negative for abdominal pain and blood in stool.  Neurological:  Negative for dizziness, light-headedness and headaches.     Objective:   Vitals:   06/06/21 0951  BP: 120/76  Pulse: (!) 51  Resp: 15  Temp: 98 F (36.7 C)  TempSrc: Temporal  SpO2: 99%  Weight: 192 lb 9.6 oz (87.4 kg)  Height: '5\' 11"'$  (1.803 m)     Physical Exam Vitals reviewed.  Constitutional:      Appearance: He is well-developed.  HENT:     Head: Normocephalic and atraumatic.  Neck:     Vascular: No carotid bruit or JVD.  Cardiovascular:     Rate and Rhythm: Normal rate and regular rhythm.     Heart sounds: Normal heart sounds. No murmur heard. Pulmonary:     Effort: Pulmonary effort is normal.     Breath sounds: Normal breath sounds. No rales.  Musculoskeletal:     Right lower leg: No edema.     Left lower leg: No edema.  Skin:    General: Skin is warm and dry.  Neurological:     Mental Status: He is alert and oriented to person, place, and time.  Psychiatric:        Mood and Affect: Mood normal.       Assessment & Plan:  Louis [name] is a 66 y.o. male . Hyponatremia  Essential hypertension Blood pressure stable on current regimen with recent sodium level normal.  Recheck in 3 months.  If return of hyponatremia may need to adjust HCTZ again but for ankle edema may need to adjust to lower dose of amlodipine, consider other agents.  36-monthphysical.  RTC precautions  No orders of the defined types were placed in this encounter.  Patient Instructions  No med changes for now.  Recent sodium level looks okay on HCTZ.  Can recheck levels in 3 months.  If sodium level drops again, we can look at adjusting around the amlodipine and hydrochlorothiazide to help with ankle edema as well as sodium.  Let me know if there are questions.     Signed,   JMerri Ray MD [facility] 06/06/21 10:34 AM

## 2021-06-06 NOTE — Patient Instructions (Signed)
No med changes for now.  Recent sodium level looks okay on HCTZ.  Can recheck levels in 3 months.  If sodium level drops again, we can look at adjusting around the amlodipine and hydrochlorothiazide to help with ankle edema as well as sodium.  Let me know if there are questions.

## 2021-06-07 ENCOUNTER — Ambulatory Visit: Payer: BC Managed Care – PPO | Admitting: Family Medicine

## 2021-06-14 ENCOUNTER — Ambulatory Visit (INDEPENDENT_AMBULATORY_CARE_PROVIDER_SITE_OTHER): Payer: BC Managed Care – PPO | Admitting: Podiatry

## 2021-06-14 ENCOUNTER — Encounter: Payer: Self-pay | Admitting: Podiatry

## 2021-06-14 DIAGNOSIS — G629 Polyneuropathy, unspecified: Secondary | ICD-10-CM | POA: Diagnosis not present

## 2021-06-14 DIAGNOSIS — B351 Tinea unguium: Secondary | ICD-10-CM

## 2021-06-14 DIAGNOSIS — M79675 Pain in left toe(s): Secondary | ICD-10-CM | POA: Diagnosis not present

## 2021-06-14 DIAGNOSIS — M79674 Pain in right toe(s): Secondary | ICD-10-CM

## 2021-06-14 NOTE — Progress Notes (Signed)
Subjective:   [name], male   DOB: 66 y.o.   MRN: [id number]   HPI Patient presents stating he is got a lot of problems with his nailbeds and he does have digital deformities and long-term neuropathy from alcoholism that he no longer drinks.  States that the neuropathy has been ongoing and may be getting a little bit better.  Patient does not smoke likes to be active   Review of Systems  All other systems reviewed and are negative.       Objective:  Physical Exam Vitals and nursing note reviewed.  Constitutional:      Appearance: He is well-developed.  Pulmonary:     Effort: Pulmonary effort is normal.  Musculoskeletal:        General: Normal range of motion.  Skin:    General: Skin is warm.  Neurological:     Mental Status: He is alert.     Vascular status intact neurological found to be diminished both sharp dull and vibratory and does have significant nail disease 1-5 both feet that are thick get incurvated and can get tender in the edges and he cannot take care of well due to his neuropathy.  Good digital perfusion well oriented.  Also has collapse medial longitudinal arch bilateral history of Lisfranc's fracture left     Assessment:  Mycotic nail infection with pain 1-5 both feet with probable ingrown component along with nail disease and digital deformities     Plan:  H&P all conditions reviewed.  He may require a more custom orthotic but he will continue over-the-counter and today I debrided nailbeds 1-5 both feet no angiogenic bleeding and discussed a [place] pedicure for him to do and we could consider doing this again at 1 point in the future may be required.  Patient will be seen back to recheck

## 2021-06-14 NOTE — Patient Instructions (Signed)

## 2021-06-16 DIAGNOSIS — M25569 Pain in unspecified knee: Secondary | ICD-10-CM | POA: Diagnosis not present

## 2021-06-28 DIAGNOSIS — F1020 Alcohol dependence, uncomplicated: Secondary | ICD-10-CM | POA: Diagnosis not present

## 2021-06-28 DIAGNOSIS — F329 Major depressive disorder, single episode, unspecified: Secondary | ICD-10-CM | POA: Diagnosis not present

## 2021-06-30 DIAGNOSIS — M25569 Pain in unspecified knee: Secondary | ICD-10-CM | POA: Diagnosis not present

## 2021-07-04 DIAGNOSIS — J9601 Acute respiratory failure with hypoxia: Secondary | ICD-10-CM | POA: Diagnosis not present

## 2021-07-11 DIAGNOSIS — M25569 Pain in unspecified knee: Secondary | ICD-10-CM | POA: Diagnosis not present

## 2021-07-21 DIAGNOSIS — M25569 Pain in unspecified knee: Secondary | ICD-10-CM | POA: Diagnosis not present

## 2021-07-26 DIAGNOSIS — F329 Major depressive disorder, single episode, unspecified: Secondary | ICD-10-CM | POA: Diagnosis not present

## 2021-07-26 DIAGNOSIS — F1020 Alcohol dependence, uncomplicated: Secondary | ICD-10-CM | POA: Diagnosis not present

## 2021-08-02 ENCOUNTER — Ambulatory Visit (INDEPENDENT_AMBULATORY_CARE_PROVIDER_SITE_OTHER): Payer: BC Managed Care – PPO | Admitting: Family Medicine

## 2021-08-02 VITALS — BP 112/70 | HR 56 | Temp 98.1°F | Resp 18 | Ht 71.0 in | Wt 191.2 lb

## 2021-08-02 DIAGNOSIS — Z0000 Encounter for general adult medical examination without abnormal findings: Secondary | ICD-10-CM

## 2021-08-02 DIAGNOSIS — M25551 Pain in right hip: Secondary | ICD-10-CM

## 2021-08-02 DIAGNOSIS — E538 Deficiency of other specified B group vitamins: Secondary | ICD-10-CM

## 2021-08-02 DIAGNOSIS — G629 Polyneuropathy, unspecified: Secondary | ICD-10-CM

## 2021-08-02 DIAGNOSIS — F1021 Alcohol dependence, in remission: Secondary | ICD-10-CM

## 2021-08-02 DIAGNOSIS — I10 Essential (primary) hypertension: Secondary | ICD-10-CM | POA: Diagnosis not present

## 2021-08-02 DIAGNOSIS — E871 Hypo-osmolality and hyponatremia: Secondary | ICD-10-CM

## 2021-08-02 DIAGNOSIS — F1011 Alcohol abuse, in remission: Secondary | ICD-10-CM

## 2021-08-02 DIAGNOSIS — D519 Vitamin B12 deficiency anemia, unspecified: Secondary | ICD-10-CM | POA: Diagnosis not present

## 2021-08-02 DIAGNOSIS — R6884 Jaw pain: Secondary | ICD-10-CM

## 2021-08-02 DIAGNOSIS — M26629 Arthralgia of temporomandibular joint, unspecified side: Secondary | ICD-10-CM | POA: Diagnosis not present

## 2021-08-02 MED ORDER — LOSARTAN POTASSIUM-HCTZ 100-12.5 MG PO TABS: 1.0000 | ORAL_TABLET | Freq: Every day | ORAL | 2 refills | Status: DC

## 2021-08-02 NOTE — Patient Instructions (Addendum)
Jaw pain likely TMJ.  Avoid nsaids for now. Heat, massage are fine as long as improving. Muscle relaxer may be helpful if needed - let me know if a new prescription needed. If not continuing to improve let me know.   Hip Xray at [facility], but follow up with Dr. Durward Fortes.   [place]  [facility] in 8:30-4:30 during weekdays, no appointment needed [place].  [place], [place] [id number]  Temporomandibular Joint Syndrome  Temporomandibular joint syndrome (TMJ syndrome) is a condition that causes pain in the temporomandibular joints. These joints are located near your ears and allow your jaw to open and close. For people with TMJ syndrome, chewing, biting, or other movements of the jaw can be difficult or painful. TMJ syndrome is often mild and goes away within a few weeks. However, sometimes the condition becomes a long-term (chronic) problem. What are the causes? This condition may be caused by: Grinding your teeth or clenching your jaw. Some people do this when they are stressed. Arthritis. An injury to the jaw. A head or neck injury. Teeth or dentures that are not aligned well. In some cases, the cause of TMJ syndrome may not be known. What are the signs or symptoms? The most common symptom of this condition is aching pain on the side of the head in the area of the TMJ. Other symptoms may include: Pain when moving your jaw, such as when chewing or biting. Not being able to open your jaw all the way. Making a clicking sound when you open your mouth. Headache. Earache. Neck or shoulder pain. How is this diagnosed? This condition may be diagnosed based on: Your symptoms and medical history. A physical exam. Your health care provider may check the range of motion of your jaw. Imaging tests, such as X-rays or an MRI. You may also need to see your dentist, who will check if your teeth and jaw are lined up correctly. How is this treated? TMJ syndrome often goes away on its own. If  treatment is needed, it may include: Eating soft foods and applying ice or heat. Medicines to relieve pain or inflammation. Medicines or massage to relax the muscles. A splint, bite plate, or mouthpiece to prevent teeth grinding or jaw clenching. Relaxation techniques or counseling to help reduce stress. A therapy for pain in which an electrical current is applied to the nerves through the skin (transcutaneous electrical nerve stimulation). Acupuncture. This may help to relieve pain. Jaw surgery. This is rarely needed. Follow these instructions at home:  Eating and drinking Eat a soft diet if you are having trouble chewing. Avoid foods that require a lot of chewing. Do not chew gum. General instructions Take over-the-counter and prescription medicines only as told by your health care provider. If directed, put ice on the painful area. To do this: Put ice in a plastic bag. Place a towel between your skin and the bag. Leave the ice on for 20 minutes, 2-3 times a day. Remove the ice if your skin turns bright red. This is very important. If you cannot feel pain, heat, or cold, you have a greater risk of damage to the area. Apply a warm, wet cloth (warm compress) to the painful area as told. Massage your jaw area and do any jaw stretching exercises as told by your health care provider. If you were given a splint, bite plate, or mouthpiece, wear it as told by your health care provider. Keep all follow-up visits. This is important. Where to  find more information [facility]: [email or web address]://avila-olson.[email or web address]/ Contact a health care provider if: You have trouble eating. You have new or worsening symptoms. Get help right away if: Your jaw locks. Summary Temporomandibular joint syndrome (TMJ syndrome) is a condition that causes pain in the temporomandibular joints. These joints are located near your ears and allow your jaw to open and close. TMJ syndrome is often mild  and goes away within a few weeks. However, sometimes the condition becomes a long-term (chronic) problem. Symptoms include an aching pain on the side of the head in the area of the TMJ, pain when chewing or biting, and being unable to open your jaw all the way. You may also make a clicking sound when you open your mouth. TMJ syndrome often goes away on its own. If treatment is needed, it may include medicines to relieve pain, reduce inflammation, or relax the muscles. A splint, bite plate, or mouthpiece may also be used to prevent teeth grinding or jaw clenching. This information is not intended to replace advice given to you by your health care provider. Make sure you discuss any questions you have with your health care provider. Document Revised: 07/31/2020 Document Reviewed: 07/31/2020 Elsevier Patient Education  2023 [employer]   Preventive Care 72-56 Years Old, Male Preventive care refers to lifestyle choices and visits with your health care provider that can promote health and wellness. Preventive care visits are also called wellness exams. What can I expect for my preventive care visit? Counseling During your preventive care visit, your health care provider may ask about your: Medical history, including: Past medical problems. Family medical history. Current health, including: Emotional well-being. Home life and relationship well-being. Sexual activity. Lifestyle, including: Alcohol, nicotine or tobacco, and drug use. Access to firearms. Diet, exercise, and sleep habits. Safety issues such as seatbelt and bike helmet use. Sunscreen use. Work and work Statistician. Physical exam Your health care provider will check your: Height and weight. These may be used to calculate your BMI (body mass index). BMI is a measurement that tells if you are at a healthy weight. Waist circumference. This measures the distance around your waistline. This measurement also tells if you are at a  healthy weight and may help predict your risk of certain diseases, such as type 2 diabetes and high blood pressure. Heart rate and blood pressure. Body temperature. Skin for abnormal spots. What immunizations do I need?  Vaccines are usually given at various ages, according to a schedule. Your health care provider will recommend vaccines for you based on your age, medical history, and lifestyle or other factors, such as travel or where you work. What tests do I need? Screening Your health care provider may recommend screening tests for certain conditions. This may include: Lipid and cholesterol levels. Diabetes screening. This is done by checking your blood sugar (glucose) after you have not eaten for a while (fasting). Hepatitis B test. Hepatitis C test. HIV (human immunodeficiency virus) test. STI (sexually transmitted infection) testing, if you are at risk. Lung cancer screening. Prostate cancer screening. Colorectal cancer screening. Talk with your health care provider about your test results, treatment options, and if necessary, the need for more tests. Follow these instructions at home: Eating and drinking  Eat a diet that includes fresh fruits and vegetables, whole grains, lean protein, and low-fat dairy products. Take vitamin and mineral supplements as recommended by your health care provider. Do not drink alcohol if your health care provider tells you  not to drink. If you drink alcohol: Limit how much you have to 0-2 drinks a day. Know how much alcohol is in your drink. In the [place] one drink equals one 12 oz bottle of beer (355 mL), one 5 oz glass of wine (148 mL), or one 1 oz glass of hard liquor (44 mL). Lifestyle Brush your teeth every morning and night with fluoride toothpaste. Floss one time each day. Exercise for at least 30 minutes 5 or more days each week. Do not use any products that contain nicotine or tobacco. These products include cigarettes, chewing tobacco,  and vaping devices, such as e-cigarettes. If you need help quitting, ask your health care provider. Do not use drugs. If you are sexually active, practice safe sex. Use a condom or other form of protection to prevent STIs. Take aspirin only as told by your health care provider. Make sure that you understand how much to take and what form to take. Work with your health care provider to find out whether it is safe and beneficial for you to take aspirin daily. Find healthy ways to manage stress, such as: Meditation, yoga, or listening to music. Journaling. Talking to a trusted person. Spending time with friends and family. Minimize exposure to UV radiation to reduce your risk of skin cancer. Safety Always wear your seat belt while driving or riding in a vehicle. Do not drive: If you have been drinking alcohol. Do not ride with someone who has been drinking. When you are tired or distracted. While texting. If you have been using any mind-altering substances or drugs. Wear a helmet and other protective equipment during sports activities. If you have firearms in your house, make sure you follow all gun safety procedures. What's next? Go to your health care provider once a year for an annual wellness visit. Ask your health care provider how often you should have your eyes and teeth checked. Stay up to date on all vaccines. This information is not intended to replace advice given to you by your health care provider. Make sure you discuss any questions you have with your health care provider. Document Revised: 06/15/2020 Document Reviewed: 06/15/2020 Elsevier Patient Education  [place].   If you have lab work done today you will be contacted with your lab results within the next 2 weeks.  If you have not heard from [place] then please contact us. The fastest way to get your results is to register for My Chart.   IF you received an x-ray today, you will receive an invoice from [facility]  Radiology. Please contact [facility] Radiology at [phone] with questions or concerns regarding your invoice.   IF you received labwork today, you will receive an invoice from [facility]. Please contact [facility] at [phone] with questions or concerns regarding your invoice.   Our billing staff will not be able to assist you with questions regarding bills from these companies.  You will be contacted with the lab results as soon as they are available. The fastest way to get your results is to activate your My Chart account. Instructions are located on the last page of this paperwork. If you have not heard from [place] regarding the results in 2 weeks, please contact this office.

## 2021-08-02 NOTE — Progress Notes (Unsigned)
Subjective:  [name], male    DOB: Jun 08, 1955  Age: 66 y.o. MRN: [id number]  CC:  Chief Complaint  Patient presents with   Annual Exam    Patient states he is here for a CPE and has had some problems on his right side of body with pain with jaw and hip    HPI Louis [name] presents for Annual Exam As well as other concerns above.  PCP: me Pulmonary: [place], chronic respiratory failure, OSA on CPAP.  [facility] - Sharol Given.  GI: Mann, hx of gastric ulcer.  Urology: [place].   Hypertension: Discussed June 6.  Prior hyponatremia, hydrochlorothiazide was discontinued with improved sodium in April.  Now treated with Toprol, losartan, amlodipine.  He did restart HCTZ due to worsening ankle swelling off meds.  Swelling had improved with restart of HCTZ, sodium stable on June 2.  On CPAP for OSA. Still on hctz, helping with leg swelling.  Prior alcohol use, abuse, status post [facility] treatment in January 2022. 1 year 6 months 15 days sober.   Lab Results  Component Value Date   NA 135 06/02/2021   K 3.9 06/02/2021   CL 98 06/02/2021   CO2 31 06/02/2021  Home readings: periodically 110-120/70 range.  BP Readings from Last 3 Encounters:  08/02/21 112/70  06/06/21 120/76  04/12/21 127/68   Lab Results  Component Value Date   CREATININE 0.82 06/02/2021    Peripheral neuropathy Treated with gabapentin 300 mg twice daily, third dose with exercise previously.  Has been followed by orthopedic foot specialist. History of B12 deficiency.  Normal on last testing. Now on 2 gabapentin in the morning with exercising earlier in the day, then 1 later in day at times.  Taking MVI with B12 daily.  Lab Results  Component Value Date   VITAMINB12 629 11/11/2020   Jaw pain Past month, Right side, angle of jaw, noticed with chewing, seems to improving. Up to ear at times. No change in hearing. No fever, swelling. No tooth pain or temp  sensitivity in teeth. Possible grinding teeth. No stress. 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  Tx: massage.  No hx of TMJ syndrome  No fever, weigh loss, night sweats.  Avoiding nsaids d/t hx of duodenal ulcer.  Reclast years ago  - no recent bisphosphonate.  Last BMD 10/12/19 - normal.   R hip pain Ache in am, better with exercise. Past 6 months. NKI, similar pattern in knee in past with arthritis. No locking, no mechanical sx's, able to WB, bike. No pain meds needed. No prior hip XR.      08/02/2021    3:03 PM 06/06/2021    9:53 AM 02/28/2021    1:35 PM 02/02/2021    3:01 PM 07/14/2020    8:06 AM  Depression screen PHQ 2/9  Decreased Interest 0 0 0 0 0  Down, Depressed, Hopeless 0 0 0 0 0  PHQ - 2 Score 0 0 0 0 0  Altered sleeping 0  0    Tired, decreased energy 0  0    Change in appetite 0  0    Feeling bad or failure about yourself  0  0    Trouble concentrating 0  0    Moving slowly or fidgety/restless 0  0    Suicidal thoughts 0  0    PHQ-9 Score 0  0    Difficult doing work/chores Not difficult at all  Health Maintenance  Topic Date Due   INFLUENZA VACCINE  08/01/2021   COVID-19 Vaccine (5 - Pfizer series) 08/18/2021 (Originally 06/04/2020)   COLONOSCOPY (Pts 45-46yr Insurance coverage will need to be confirmed)  04/29/2024   TETANUS/TDAP  11/19/2026   Hepatitis C Screening  Completed   HIV Screening  Completed   Zoster Vaccines- Shingrix  Completed   HPV VACCINES  Aged Out  Colonoscopy 04/30/2019, repeat 5 years .  Followed by urology with history of elevated PSA. Lab Results  Component Value Date   PSA1 5.9 (H) 02/22/2017   PSA 4.307 10/11/2014   PSA 3.19 10/30/2013   PSA 3.19 10/30/2013      Immunization History  Administered Date(s) Administered   Influenza, High Dose Seasonal PF 09/19/2017   Influenza,inj,Quad PF,6+ Mos 10/08/2014, 09/22/2015, 09/16/2018   Influenza-Unspecified 11/18/2016, 09/19/2019, 11/16/2020   PFIZER(Purple  Top)SARS-COV-2 Vaccination 01/22/2019, 02/12/2019, 09/29/2019, 04/09/2020   Pneumococcal Conjugate-13 07/09/2017   Pneumococcal Polysaccharide-23 02/14/2008   Td 01/01/2006   Tdap 11/18/2016   Zoster Recombinat (Shingrix) 12/22/2016, 07/14/2020   Zoster, Live 06/06/2011    No results found. Optho in past 6 months - Dr. GKaty Fitch  Upper lid blepharoplasty in April.   Dental: every 6 months.   Alcohol:none - as above  Tobacco: none  Exercise: biking 465m, pilates 2 days per week. Strength pilates few other days for 1 hour. Core work. Biking for transportation when able.    History Patient Active Problem List   Diagnosis Date Noted   Dermatochalasis 02/10/2021   Ectropion 02/10/2021   Claw toe, acquired, left    Bunion of great toe of left foot    Skin lesion of face 06/14/2020   Unilateral primary osteoarthritis, right knee 01/06/2020   S/P hernia repair 10/15/2019   Neuropathy 07/20/2019   Macrocytic anemia    Debility 07/07/2019   Atelectasis    Enterococcal bacteremia 06/26/2019   Perforated viscus 01/06/2019   Onychomycosis 05/19/2018   Foot fracture, left, closed, initial encounter 05/09/2018   Lisfranc dislocation, left, initial encounter    Primary pulmonary hypertension ([place]0700/34/9179 Diastolic dysfunction 0715/05/6977 OSA (obstructive sleep apnea) 07/08/2017   SOB (shortness of breath) 07/02/2017   Chronic respiratory failure with hypoxia and hypercapnia (HCL[place]07/02/2017   Cardiomegaly 07/02/2017   Exposure keratitis 02/17/2017   Pedal edema 12/18/2016   Transaminitis 12/18/2016   Bilateral pes planus 12/18/2016   Psoriasis-eczema overlap condition 07/30/2016   High serum high density lipoprotein (HDL) 09/26/2015   Epistaxis 09/26/2015   Renal artery stenosis in 1 of 2 vessels (HCC)    Essential tremor    Elevated PSA    GERD (gastroesophageal reflux disease)    Health maintenance examination 10/08/2014   Anxiety state 10/08/2014   Obesity, Class  I, BMI 30-34.9    Mild intermittent asthma in adult without complication    Seasonal allergies    HTN (hypertension)    Scoliosis    Osteoporosis    Past Medical History:  Diagnosis Date   Alcohol use disorder, mild, abuse    Anemia    Anxiety    situational   Arthritis    Phreesia 02/21/2020   Asthma    Phreesia 02/21/2020   BPH (benign prostatic hyperplasia)    on flomax- Patien tand wife denies- and patient has never been on Flomax   Bundle branch block    per prior PCP records   CHF (congestive heart failure) ([place]CD[place]   Chronic kidney disease  Acute Renal Failure- June 2021- admission- kidneys recovered quickly   Complication of anesthesia 03/11/2019   woke up before surgery was over- "paralytic"   COPD (chronic obstructive pulmonary disease) ([place])    Diverticulosis    by colonoscopy   Eczema    per prior PCP records   Elevated PSA 2015   peaked 6s, s/p benign biopsy 2014, sees urology [name] [facility])   Essential tremor    GERD (gastroesophageal reflux disease)    per prior PCP records   History of panic attacks    per prior PCP records   HTN (hypertension)    Hypertension    Phreesia 02/21/2020   Mild intermittent asthma in adult without complication    Obesity, Class I, BMI 30-34.9    Osteoporosis    DEXA 06/2013 with osteopenia - h/o ?wrist/hip fracture from AVN from chronic steroid use (asthma), took reclast for 1 year   Pneumonia    10/08/19 >10 years ago   Seasonal allergies    Sleep apnea    Substance abuse ([place])    Phreesia 02/21/2020   Thoracic scoliosis childhood   Transaminitis    per prior PCP records   Vitamin B 12 deficiency    Past Surgical History:  Procedure Laterality Date   APPLICATION OF WOUND VAC Left 05/09/2018   Procedure: Application Of Wound Vac;  Surgeon: Newt Minion, MD;  Location: [place];  Service: Orthopedics;  Laterality: Left;   BIOPSY  04/30/2019   Procedure: BIOPSY;  Surgeon: Juanita Craver, MD;  [facility];   Service: Endoscopy;;   BROW LIFT Bilateral 04/12/2021   Procedure: UPPER LID BLEPHAROPLASTY;  Surgeon: Wallace Going, DO;  Location: [place];  Service: Plastics;  Laterality: Bilateral;   COLONOSCOPY  12/2013   polyps, int hem, diverticulosis, rpt 5 yrs [name])   COLONOSCOPY WITH PROPOFOL N/A 04/30/2019   Procedure: COLONOSCOPY WITH PROPOFOL;  Surgeon: Juanita Craver, MD;  [facility];  Service: Endoscopy;  Laterality: N/A;   ELBOW SURGERY Right 2008   golfer's elbow   ESOPHAGOGASTRODUODENOSCOPY (EGD) WITH PROPOFOL N/A 04/30/2019   Procedure: ESOPHAGOGASTRODUODENOSCOPY (EGD) WITH PROPOFOL;  Surgeon: Juanita Craver, MD;  [facility];  Service: Endoscopy;  Laterality: N/A;   HERNIA REPAIR N/A    Phreesia 02/21/2020   INCISIONAL HERNIA REPAIR N/A 10/15/2019   Procedure: HERNIA REPAIR INCISIONAL WITH MESH, TAR;  Surgeon: Ralene Ok, MD;  Location: [place];  Service: General;  Laterality: N/A;   INGUINAL HERNIA REPAIR Bilateral childhood & ~1995   LAPAROTOMY N/A 01/05/2019   Procedure: EXPLORATORY LAPAROTOMY graham patch repair;  Surgeon: Benjamine Sprague, DO;  Location: [facility];  Service: General;  Laterality: N/A;   LAPAROTOMY N/A 10/15/2019   Procedure: EXPLORATORY LAPAROTOMY;  Surgeon: Ralene Ok, MD;  Location: [place];  Service: General;  Laterality: N/A;   LYSIS OF ADHESION N/A 10/15/2019   Procedure: LYSIS OF ADHESION;  Surgeon: Ralene Ok, MD;  Location: [place];  Service: General;  Laterality: N/A;   NASAL SEPTUM SURGERY  2013   deviated septum   ORIF ANKLE FRACTURE Left 05/09/2018   Procedure: OPEN REDUCTION INTERNAL FIXATION LEFT LISFRANC FRACTURE/DISLOCATION;  Surgeon: Newt Minion, MD;  Location: [place];  Service: Orthopedics;  Laterality: Left;   POLYPECTOMY  04/30/2019   Procedure: POLYPECTOMY;  Surgeon: Juanita Craver, MD;  [facility];  Service: Endoscopy;;   TEE WITHOUT CARDIOVERSION N/A 06/29/2019   Procedure:  TRANSESOPHAGEAL ECHOCARDIOGRAM (TEE);  Surgeon: Thayer Headings, MD;  [facility];  Service: Cardiovascular;  Laterality: N/A;   US ECHOCARDIOGRAPHY  02/2009   WNL, EF >55% Claiborne Billings)   US RENAL/AORTA Right 05/2009   1-59% diameter reduction renal artery, rec rpt 2 yrs   VASECTOMY N/A    Phreesia 02/21/2020   WEIL OSTEOTOMY Left 11/16/2020   Procedure: WEIL OSTEOTOMY LEFT 2ND METATARSAL,  2ND TOE DISTAL INTERPHALANGEAL RESECTION AND [name] OSTEOTOMY WEIL OSTEOTOMY THIRD METATARSAL;  Surgeon: Newt Minion, MD;  Location: [place];  Service: Orthopedics;  Laterality: Left;   Allergies  Allergen Reactions   Accupril [Quinapril Hcl] Cough   Nsaids     Perforated gastric ulcer   Prior to Admission medications   Medication Sig Start Date End Date Taking? Authorizing Provider  acetaminophen (TYLENOL) 500 MG tablet Take 500 mg by mouth every 6 (six) hours as needed for mild pain.   Yes [provider]  amLODipine (NORVASC) 10 MG tablet TAKE 1 TABLET(10 MG) BY MOUTH DAILY 02/02/21  Yes Wendie Agreste, MD  diclofenac Sodium (VOLTAREN) 1 % GEL Apply 1 application topically 4 (four) times daily as needed (knee pain).   Yes [provider]  gabapentin (NEURONTIN) 300 MG capsule TAKE 1 CAPSULE(300 MG) BY MOUTH THREE TIMES DAILY 05/08/21  Yes Wendie Agreste, MD  losartan (COZAAR) 100 MG tablet TAKE 1 TABLET(100 MG) BY MOUTH DAILY 02/02/21  Yes Wendie Agreste, MD  metoprolol succinate (TOPROL-XL) 50 MG 24 hr tablet TAKE 1 TABLET(50 MG) BY MOUTH DAILY WITH A MEAL 02/02/21  Yes Wendie Agreste, MD  Multiple Vitamin (MULTIVITAMIN WITH MINERALS) TABS tablet Take 1 tablet by mouth daily. 07/08/19  Yes Dahal, Marlowe Aschoff, MD  olopatadine (PATANOL) 0.1 % ophthalmic solution Place 1 drop into both eyes 2 (two) times daily as needed for allergies.   Yes [provider]  omeprazole (PRILOSEC) 20 MG capsule Take 20 mg by mouth daily.   Yes [provider]   Social History    Socioeconomic History   Marital status: Married    Spouse name: Not on file   Number of children: Not on file   Years of education: Not on file   Highest education level: Not on file  Occupational History   Not on file  Tobacco Use   Smoking status: Never   Smokeless tobacco: Never  Vaping Use   Vaping Use: Never used  Substance and Sexual Activity   Alcohol use: Not Currently   Drug use: No   Sexual activity: Yes    Birth control/protection: None  Other Topics Concern   Not on file  Social History Narrative   Lives with wife (retired Materials engineer), 76yo dog died 04-05-2014   Grown children   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, was Chief Executive Officer   Edu: [employer], masters in Investment banker, operational    Activity: TRX and cardio and pilates   Diet: good water, fruits/vegetables daily   Social Determinants of Radio broadcast assistant Strain: Not on file  Food Insecurity: Not on file  Transportation Needs: Not on file  Physical Activity: Not on file  Stress: Not on file  Social Connections: Not on file  Intimate Partner Violence: Not on file    Review of Systems   Objective:   Vitals:   08/02/21 1501  BP: 112/70  Pulse: (!) 56  Resp: 18  Temp: 98.1 F (36.7 C)  TempSrc: Temporal  SpO2: 97%  Weight: 191 lb 3.2 oz (86.7 kg)  Height: '5\' 11"'$  (1.803 m)  Physical Exam Vitals reviewed.  Constitutional:      Appearance: He is well-developed.  HENT:     Head: Normocephalic and atraumatic.     Comments: Right jaw, some clicking, slight discomfort at the right TMJ.  Slight click with lateral jaw motion.  Able to open and close jaw without difficulty.  No focal bony tenderness.    Right Ear: External ear normal.     Left Ear: External ear normal.  Eyes:     Conjunctiva/sclera: Conjunctivae normal.     Pupils: Pupils are equal, round, and reactive to light.  Neck:     Thyroid: No thyromegaly.  Cardiovascular:     Rate and Rhythm: Normal rate and regular rhythm.     Heart sounds: Normal heart  sounds.  Pulmonary:     Effort: Pulmonary effort is normal. No respiratory distress.     Breath sounds: Normal breath sounds. No wheezing.  Abdominal:     General: There is no distension.     Palpations: Abdomen is soft.     Tenderness: There is no abdominal tenderness.  Musculoskeletal:        General: No tenderness. Normal range of motion.     Cervical back: Normal range of motion and neck supple.     Comments: Right hip, pain-free range of motion, no focal bony tenderness.  Describes area of discomfort at the lateral hip, not buttocks, paraspinal muscles nontender and lumbar spine without midline bony tenderness.  Lymphadenopathy:     Cervical: No cervical adenopathy.  Skin:    General: Skin is warm and dry.  Neurological:     Mental Status: He is alert and oriented to person, place, and time.     Deep Tendon Reflexes: Reflexes are normal and symmetric.  Psychiatric:        Behavior: Behavior normal.      Assessment & Plan:  Louis [name] is a 66 y.o. male . Annual physical exam  - -anticipatory guidance as below in AVS, screening labs above. Health maintenance items as above in HPI discussed/recommended as applicable.   Essential hypertension - Plan: losartan-hydrochlorothiazide (HYZAAR) 100-12.5 MG tablet, Comprehensive metabolic panel, Lipid panel `-  Stable, tolerating current regimen. Medications refilled. Labs pending as above.  Combined the losartan, HCTZ to single pill.  Hyponatremia - Plan: Comprehensive metabolic panel  -Higher hyponatremia, thought to be related to HCTZ, but then looked okay back on meds.  He is consistently on HCTZ - repeat labs.  Neuropathy  -Stable with gabapentin, continue same regimen.  Alcohol abuse, in remission  -Commended on continued abstinence.  B12 deficiency - Plan: B12  -On B12 supplementation, prior low reading, check B12 with history of neuropathy.  Right hip pain - Plan: DG HIP UNILAT W OR W/O PELVIS 2-3 VIEWS  RIGHT  -Reassuring exam, possible underlying degenerative joint disease, plans on meeting with Ortho.  Check imaging to evaluate for DJD.  No new meds for now.  Continue stretches, exercise as tolerated.  Jaw pain TMJ syndrome  -Based on location, symptoms, suspicious for temporomandibular joint pain syndrome.  Symptoms are improving.  Will defer imaging at this time precautions discussed if worsening or not continuing to improve.    Meds ordered this encounter  Medications   losartan-hydrochlorothiazide (HYZAAR) 100-12.5 MG tablet    Sig: Take 1 tablet by mouth daily.    Dispense:  90 tablet    Refill:  2   Patient Instructions  Jaw pain likely TMJ.  Avoid nsaids for  now. Heat, massage are fine as long as improving. Muscle relaxer may be helpful if needed - let me know if a new prescription needed. If not continuing to improve let me know.   [facility]ip Xray at [facility], but follow up with Dr. Durward Fortes.  [facility]  Walk in 8:30-4:30 during weekdays, no appointment needed [place].  [place], [place] [id number]  Temporomandibular Joint Syndrome  Temporomandibular joint syndrome (TMJ syndrome) is a condition that causes pain in the temporomandibular joints. These joints are located near your ears and allow your jaw to open and close. For people with TMJ syndrome, chewing, biting, or other movements of the jaw can be difficult or painful. TMJ syndrome is often mild and goes away within a few weeks. However, sometimes the condition becomes a long-term (chronic) problem. What are the causes? This condition may be caused by: Grinding your teeth or clenching your jaw. Some people do this when they are stressed. Arthritis. An injury to the jaw. A head or neck injury. Teeth or dentures that are not aligned well. In some cases, the cause of TMJ syndrome may not be known. What are the signs or symptoms? The most common symptom of this condition is aching pain on the side of the head in the  area of the TMJ. Other symptoms may include: Pain when moving your jaw, such as when chewing or biting. Not being able to open your jaw all the way. Making a clicking sound when you open your mouth. Headache. Earache. Neck or shoulder pain. How is this diagnosed? This condition may be diagnosed based on: Your symptoms and medical history. A physical exam. Your health care provider may check the range of motion of your jaw. Imaging tests, such as X-rays or an MRI. You may also need to see your dentist, who will check if your teeth and jaw are lined up correctly. How is this treated? TMJ syndrome often goes away on its own. If treatment is needed, it may include: Eating soft foods and applying ice or heat. Medicines to relieve pain or inflammation. Medicines or massage to relax the muscles. A splint, bite plate, or mouthpiece to prevent teeth grinding or jaw clenching. Relaxation techniques or counseling to help reduce stress. A therapy for pain in which an electrical current is applied to the nerves through the skin (transcutaneous electrical nerve stimulation). Acupuncture. This may help to relieve pain. Jaw surgery. This is rarely needed. Follow these instructions at home:  Eating and drinking Eat a soft diet if you are having trouble chewing. Avoid foods that require a lot of chewing. Do not chew gum. General instructions Take over-the-counter and prescription medicines only as told by your health care provider. If directed, put ice on the painful area. To do this: Put ice in a plastic bag. Place a towel between your skin and the bag. Leave the ice on for 20 minutes, 2-3 times a day. Remove the ice if your skin turns bright red. This is very important. If you cannot feel pain, heat, or cold, you have a greater risk of damage to the area. Apply a warm, wet cloth (warm compress) to the painful area as told. Massage your jaw area and do any jaw stretching exercises as told by your  health care provider. If you were given a splint, bite plate, or mouthpiece, wear it as told by your health care provider. Keep all follow-up visits. This is important. Where to find more information [place]  and Cranio[employer]: https://avila-olson.com/ Contact a health care provider if: You have trouble eating. You have new or worsening symptoms. Get help right away if: Your jaw locks. Summary Temporomandibular joint syndrome (TMJ syndrome) is a condition that causes pain in the temporomandibular joints. These joints are located near your ears and allow your jaw to open and close. TMJ syndrome is often mild and goes away within a few weeks. However, sometimes the condition becomes a long-term (chronic) problem. Symptoms include an aching pain on the side of the head in the area of the TMJ, pain when chewing or biting, and being unable to open your jaw all the way. You may also make a clicking sound when you open your mouth. TMJ syndrome often goes away on its own. If treatment is needed, it may include medicines to relieve pain, reduce inflammation, or relax the muscles. A splint, bite plate, or mouthpiece may also be used to prevent teeth grinding or jaw clenching. This information is not intended to replace advice given to you by your health care provider. Make sure you discuss any questions you have with your health care provider. Document Revised: 07/31/2020 Document Reviewed: 07/31/2020 Elsevier Patient Education  2023 Elsevier Inc.   Preventive Care 87-29 Years Old, Male Preventive care refers to lifestyle choices and visits with your health care provider that can promote health and wellness. Preventive care visits are also called wellness exams. What can I expect for my preventive care visit? Counseling During your preventive care visit, your health care provider may ask about your: Medical history, including: Past medical problems. Family medical  history. Current health, including: Emotional well-being. Home life and relationship well-being. Sexual activity. Lifestyle, including: Alcohol, nicotine or tobacco, and drug use. Access to firearms. Diet, exercise, and sleep habits. Safety issues such as seatbelt and bike helmet use. Sunscreen use. Work and work Statistician. Physical exam Your health care provider will check your: Height and weight. These may be used to calculate your BMI (body mass index). BMI is a measurement that tells if you are at a healthy weight. Waist circumference. This measures the distance around your waistline. This measurement also tells if you are at a healthy weight and may help predict your risk of certain diseases, such as type 2 diabetes and high blood pressure. Heart rate and blood pressure. Body temperature. Skin for abnormal spots. What immunizations do I need?  Vaccines are usually given at various ages, according to a schedule. Your health care provider will recommend vaccines for you based on your age, medical history, and lifestyle or other factors, such as travel or where you work. What tests do I need? Screening Your health care provider may recommend screening tests for certain conditions. This may include: Lipid and cholesterol levels. Diabetes screening. This is done by checking your blood sugar (glucose) after you have not eaten for a while (fasting). Hepatitis B test. Hepatitis C test. HIV (human immunodeficiency virus) test. STI (sexually transmitted infection) testing, if you are at risk. Lung cancer screening. Prostate cancer screening. Colorectal cancer screening. Talk with your health care provider about your test results, treatment options, and if necessary, the need for more tests. Follow these instructions at home: Eating and drinking  Eat a diet that includes fresh fruits and vegetables, whole grains, lean protein, and low-fat dairy products. Take vitamin and mineral  supplements as recommended by your health care provider. Do not drink alcohol if your health care provider tells you not to drink. If you drink alcohol:  Limit how much you have to 0-2 drinks a day. Know how much alcohol is in your drink. In the [place] one drink equals one 12 oz bottle of beer (355 mL), one 5 oz glass of wine (148 mL), or one 1 oz glass of hard liquor (44 mL). Lifestyle Brush your teeth every morning and night with fluoride toothpaste. Floss one time each day. Exercise for at least 30 minutes 5 or more days each week. Do not use any products that contain nicotine or tobacco. These products include cigarettes, chewing tobacco, and vaping devices, such as e-cigarettes. If you need help quitting, ask your health care provider. Do not use drugs. If you are sexually active, practice safe sex. Use a condom or other form of protection to prevent STIs. Take aspirin only as told by your health care provider. Make sure that you understand how much to take and what form to take. Work with your health care provider to find out whether it is safe and beneficial for you to take aspirin daily. Find healthy ways to manage stress, such as: Meditation, yoga, or listening to music. Journaling. Talking to a trusted person. Spending time with friends and family. Minimize exposure to UV radiation to reduce your risk of skin cancer. Safety Always wear your seat belt while driving or riding in a vehicle. Do not drive: If you have been drinking alcohol. Do not ride with someone who has been drinking. When you are tired or distracted. While texting. If you have been using any mind-altering substances or drugs. Wear a helmet and other protective equipment during sports activities. If you have firearms in your house, make sure you follow all gun safety procedures. What's next? Go to your health care provider once a year for an annual wellness visit. Ask your health care provider how often you should  have your eyes and teeth checked. Stay up to date on all vaccines. This information is not intended to replace advice given to you by your health care provider. Make sure you discuss any questions you have with your health care provider. Document Revised: 06/15/2020 Document Reviewed: 06/15/2020 Elsevier Patient Education  [place].   If you have lab work done today you will be contacted with your lab results within the next 2 weeks.  If you have not heard from [place] then please contact us. The fastest way to get your results is to register for My Chart.   IF you received an x-ray today, you will receive an invoice from [facility]. Please contact [facility] at [phone] with questions or concerns regarding your invoice.   IF you received labwork today, you will receive an invoice from [facility]. Please contact [facility] at [phone] with questions or concerns regarding your invoice.   Our billing staff will not be able to assist you with questions regarding bills from these companies.  You will be contacted with the lab results as soon as they are available. The fastest way to get your results is to activate your My Chart account. Instructions are located on the last page of this paperwork. If you have not heard from [place] regarding the results in 2 weeks, please contact this office.        Signed,   Merri Ray, MD [place], [facility] 08/02/21 4:31 PM

## 2021-08-03 ENCOUNTER — Encounter: Payer: Self-pay | Admitting: Family Medicine

## 2021-08-03 LAB — LIPID PANEL
Cholesterol: 183 mg/dL (ref 0–200)
HDL: 53.5 mg/dL (ref >39.00)
LDL Cholesterol: 117 mg/dL — ABNORMAL HIGH (ref 0–99)
NonHDL: 129.47
Total CHOL/HDL Ratio: 3
Triglycerides: 60 mg/dL (ref 0.0–149.0)
VLDL: 12 mg/dL (ref 0.0–40.0)

## 2021-08-03 LAB — COMPREHENSIVE METABOLIC PANEL
ALT: 14 U/L (ref 0–53)
AST: 19 U/L (ref 0–37)
Albumin: 4.9 g/dL (ref 3.5–5.2)
Alkaline Phosphatase: 71 U/L (ref 39–117)
BUN: 19 mg/dL (ref 6–23)
CO2: 28 mEq/L (ref 19–32)
Calcium: 9.7 mg/dL (ref 8.4–10.5)
Chloride: 98 mEq/L (ref 96–112)
Creatinine, Ser: 0.9 mg/dL (ref 0.40–1.50)
GFR: 90.51 mL/min (ref >60.00)
Glucose, Bld: 84 mg/dL (ref 70–99)
Potassium: 3.9 mEq/L (ref 3.5–5.1)
Sodium: 135 mEq/L (ref 135–145)
Total Bilirubin: 0.8 mg/dL (ref 0.2–1.2)
Total Protein: 7.7 g/dL (ref 6.0–8.3)

## 2021-08-03 LAB — VITAMIN B12: Vitamin B-12: 492 pg/mL (ref 211–911)

## 2021-08-04 DIAGNOSIS — J9601 Acute respiratory failure with hypoxia: Secondary | ICD-10-CM | POA: Diagnosis not present

## 2021-08-07 ENCOUNTER — Encounter: Payer: Self-pay | Admitting: Family Medicine

## 2021-08-07 DIAGNOSIS — E785 Hyperlipidemia, unspecified: Secondary | ICD-10-CM

## 2021-08-15 DIAGNOSIS — M25569 Pain in unspecified knee: Secondary | ICD-10-CM | POA: Diagnosis not present

## 2021-08-16 ENCOUNTER — Telehealth: Payer: Self-pay | Admitting: Orthopaedic Surgery

## 2021-08-16 NOTE — Telephone Encounter (Signed)
Pt called and states that he would like to see Dr.XU earlier than 08/25 because he is leaving for [place] for a few weeks. He has been a pt of Dr.Whitfield for years and he referred him to [place].   CB [phone]

## 2021-08-23 DIAGNOSIS — F1020 Alcohol dependence, uncomplicated: Secondary | ICD-10-CM | POA: Diagnosis not present

## 2021-08-23 DIAGNOSIS — F329 Major depressive disorder, single episode, unspecified: Secondary | ICD-10-CM | POA: Diagnosis not present

## 2021-08-24 ENCOUNTER — Ambulatory Visit (INDEPENDENT_AMBULATORY_CARE_PROVIDER_SITE_OTHER): Payer: BC Managed Care – PPO | Admitting: Orthopaedic Surgery

## 2021-08-24 ENCOUNTER — Ambulatory Visit (INDEPENDENT_AMBULATORY_CARE_PROVIDER_SITE_OTHER): Payer: BC Managed Care – PPO

## 2021-08-24 DIAGNOSIS — M25551 Pain in right hip: Secondary | ICD-10-CM

## 2021-08-24 NOTE — Progress Notes (Signed)
Office Visit Note   Patient: Ladarius Seubert           Date of Birth: [birth year]           MRN: [id number] Visit Date: 08/24/2021              Requested by: Wendie Agreste, MD [address] [place],  [place] [address] PCP: Wendie Agreste, MD   Assessment & Plan: Visit Diagnoses:  1. Pain of right hip     Plan: Impression is lateral right hip pain.  Possible etiologies reviewed with the patient.  Overall the symptoms are tolerable and manageable.  He will continue with them.  Home exercises provided.  Follow-up as needed.  Follow-Up Instructions: No follow-ups on file.   Orders:  Orders Placed This Encounter  Procedures   XR HIP UNILAT W OR W/O PELVIS 2-3 VIEWS RIGHT   No orders of the defined types were placed in this encounter.     Procedures: No procedures performed   Clinical Data: No additional findings.   Subjective: Chief Complaint  Patient presents with   Right Hip - Pain    HPI Koa comes in today for evaluation of lateral hip pain that has been getting worse over the last few months.  Denies any injuries.  He is a fairly active gentleman.  He is going on a 5-day bike ride trip and wants to get it checked out.  Denies any groin pain.  Takes Tylenol for the pain which helps.  Review of Systems  Constitutional: Negative.   All other systems reviewed and are negative.    Objective: Vital Signs: There were no vitals taken for this visit.  Physical Exam Vitals and nursing note reviewed.  Constitutional:      Appearance: He is well-developed.  HENT:     Head: Normocephalic and atraumatic.  Eyes:     Pupils: Pupils are equal, round, and reactive to light.  Pulmonary:     Effort: Pulmonary effort is normal.  Abdominal:     Palpations: Abdomen is soft.  Musculoskeletal:        General: Normal range of motion.     Cervical back: Neck supple.  Skin:    General: Skin is warm.  Neurological:     Mental Status: He is alert and oriented  to person, place, and time.  Psychiatric:        Behavior: Behavior normal.        Thought Content: Thought content normal.        Judgment: Judgment normal.     Ortho Exam Examination of the right hip shows point tenderness laterally.  Hip range of motion is normal.  No intra-articular provocative signs. Specialty Comments:  No specialty comments available.  Imaging: XR HIP UNILAT W OR W/O PELVIS 2-3 VIEWS RIGHT  Result Date: 08/24/2021 No acute or structural abnormalities    PMFS History: Patient Active Problem List   Diagnosis Date Noted   Dermatochalasis 02/10/2021   Ectropion 02/10/2021   Claw toe, acquired, left    Bunion of great toe of left foot    Skin lesion of face 06/14/2020   Unilateral primary osteoarthritis, right knee 01/06/2020   S/P hernia repair 10/15/2019   Neuropathy 07/20/2019   Macrocytic anemia    Debility 07/07/2019   Atelectasis    Enterococcal bacteremia 06/26/2019   Perforated viscus 01/06/2019   Onychomycosis 05/19/2018   Foot fracture, left, closed, initial encounter 05/09/2018   Lisfranc  dislocation, left, initial encounter    Primary pulmonary hypertension ([place]) 39/76/7341   Diastolic dysfunction 93/79/0240   OSA (obstructive sleep apnea) 07/08/2017   SOB (shortness of breath) 07/02/2017   Chronic respiratory failure with hypoxia and hypercapnia ([place]) 07/02/2017   Cardiomegaly 07/02/2017   Exposure keratitis 02/17/2017   Pedal edema 12/18/2016   Transaminitis 12/18/2016   Bilateral pes planus 12/18/2016   Psoriasis-eczema overlap condition 07/30/2016   High serum high density lipoprotein (HDL) 09/26/2015   Epistaxis 09/26/2015   Renal artery stenosis in 1 of 2 vessels (HCC)    Essential tremor    Elevated PSA    GERD (gastroesophageal reflux disease)    Health maintenance examination 10/08/2014   Anxiety state 10/08/2014   Obesity, Class I, BMI 30-34.9    Mild intermittent asthma in adult without complication    Seasonal  allergies    HTN (hypertension)    Scoliosis    Osteoporosis    Past Medical History:  Diagnosis Date   Alcohol use disorder, mild, abuse    Anemia    Anxiety    situational   Arthritis    Phreesia 02/21/2020   Asthma    Phreesia 02/21/2020   BPH (benign prostatic hyperplasia)    on flomax- Patien tand wife denies- and patient has never been on Flomax   Bundle branch block    per prior PCP records   CHF (congestive heart failure) ([place])    Chronic kidney disease    Acute Renal Failure- June 2021- admission- kidneys recovered quickly   Complication of anesthesia 03/11/2019   woke up before surgery was over- "paralytic"   COPD (chronic obstructive pulmonary disease) ([place])    Diverticulosis    by colonoscopy   Eczema    per prior PCP records   Elevated PSA 2015   peaked 6s, s/p benign biopsy 2014, sees urology [name]   Essential tremor    GERD (gastroesophageal reflux disease)    per prior PCP records   History of panic attacks    per prior PCP records   HTN (hypertension)    Hypertension    Phreesia 02/21/2020   Mild intermittent asthma in adult without complication    Obesity, Class I, BMI 30-34.9    Osteoporosis    DEXA 06/2013 with osteopenia - h/o ?wrist/hip fracture from AVN from chronic steroid use (asthma), took reclast for 1 year   Pneumonia    10/08/19 >10 years ago   Seasonal allergies    Sleep apnea    Substance abuse ([place])    Phreesia 02/21/2020   Thoracic scoliosis childhood   Transaminitis    per prior PCP records   Vitamin B 12 deficiency     Family History  Problem Relation Age of Onset   Cancer Father 28       colon   Prostate cancer Father    Diabetes Mother    Alcohol abuse Mother    Stroke Mother    Cancer Maternal Grandmother        pancreatic   Ataxia Brother    Ataxia Sister    CAD Neg Hx     Past Surgical History:  Procedure Laterality Date   APPLICATION OF WOUND VAC Left 05/09/2018   Procedure: Application Of Wound  Vac;  Surgeon: Newt Minion, MD;  Location: [facility];  Service: Orthopedics;  Laterality: Left;   BIOPSY  04/30/2019   Procedure: BIOPSY;  Surgeon: Juanita Craver, MD;  Location: [facility];  Service: Endoscopy;;  BROW LIFT Bilateral 04/12/2021   Procedure: UPPER LID BLEPHAROPLASTY;  Surgeon: Wallace Going, DO;  Location: [facility];  Service: Plastics;  Laterality: Bilateral;   COLONOSCOPY  12/2013   polyps, int hem, diverticulosis, rpt 5 yrs [name])   COLONOSCOPY WITH PROPOFOL N/A 04/30/2019   Procedure: COLONOSCOPY WITH PROPOFOL;  Surgeon: Juanita Craver, MD;  [facility]: [facility];  Service: Endoscopy;  Laterality: N/A;   ELBOW SURGERY Right 2008   golfer's elbow   ESOPHAGOGASTRODUODENOSCOPY (EGD) WITH PROPOFOL N/A 04/30/2019   Procedure: ESOPHAGOGASTRODUODENOSCOPY (EGD) WITH PROPOFOL;  Surgeon: Juanita Craver, MD;  [facility];  Service: Endoscopy;  Laterality: N/A;   HERNIA REPAIR N/A    Phreesia 02/21/2020   INCISIONAL HERNIA REPAIR N/A 10/15/2019   Procedure: HERNIA REPAIR INCISIONAL WITH MESH, TAR;  Surgeon: Ralene Ok, MD;  Location: [facility];  Service: General;  Laterality: N/A;   INGUINAL HERNIA REPAIR Bilateral childhood & ~1995   LAPAROTOMY N/A 01/05/2019   Procedure: EXPLORATORY LAPAROTOMY graham patch repair;  Surgeon: Benjamine Sprague, DO;  Location: [facility];  Service: General;  Laterality: N/A;   LAPAROTOMY N/A 10/15/2019   Procedure: EXPLORATORY LAPAROTOMY;  Surgeon: Ralene Ok, MD;  Location: [facility];  Service: General;  Laterality: N/A;   LYSIS OF ADHESION N/A 10/15/2019   Procedure: LYSIS OF ADHESION;  Surgeon: Ralene Ok, MD;  Location: [facility];  Service: General;  Laterality: N/A;   NASAL SEPTUM SURGERY  2013   deviated septum   ORIF ANKLE FRACTURE Left 05/09/2018   Procedure: OPEN REDUCTION INTERNAL FIXATION LEFT LISFRANC FRACTURE/DISLOCATION;  Surgeon: Newt Minion, MD;  Location: [facility];  Service: Orthopedics;  Laterality: Left;    POLYPECTOMY  04/30/2019   Procedure: POLYPECTOMY;  Surgeon: Juanita Craver, MD;  [facility];  Service: Endoscopy;;   TEE WITHOUT CARDIOVERSION N/A 06/29/2019   Procedure: TRANSESOPHAGEAL ECHOCARDIOGRAM (TEE);  Surgeon: Acie Fredrickson Wonda Cheng, MD;  [facility];  Service: Cardiovascular;  Laterality: N/A;   US ECHOCARDIOGRAPHY  02/2009   WNL, EF >55% Claiborne Billings)   US RENAL/AORTA Right 05/2009   1-59% diameter reduction renal artery, rec rpt 2 yrs   VASECTOMY N/A    Phreesia 02/21/2020   WEIL OSTEOTOMY Left 11/16/2020   Procedure: WEIL OSTEOTOMY LEFT 2ND METATARSAL,  2ND TOE DISTAL INTERPHALANGEAL RESECTION AND [name] OSTEOTOMY WEIL OSTEOTOMY THIRD METATARSAL;  Surgeon: Newt Minion, MD;  Location: [facility];  Service: Orthopedics;  Laterality: Left;   Social History   Occupational History   Not on file  Tobacco Use   Smoking status: Never   Smokeless tobacco: Never  Vaping Use   Vaping Use: Never used  Substance and Sexual Activity   Alcohol use: Not Currently   Drug use: No   Sexual activity: Yes    Birth control/protection: None

## 2021-08-31 ENCOUNTER — Ambulatory Visit: Payer: BC Managed Care – PPO | Admitting: Orthopaedic Surgery

## 2021-09-04 DIAGNOSIS — J9601 Acute respiratory failure with hypoxia: Secondary | ICD-10-CM | POA: Diagnosis not present

## 2021-09-06 ENCOUNTER — Encounter: Payer: Self-pay | Admitting: Family Medicine

## 2021-09-06 ENCOUNTER — Encounter: Payer: BC Managed Care – PPO | Admitting: Family Medicine

## 2021-09-12 ENCOUNTER — Ambulatory Visit (HOSPITAL_BASED_OUTPATIENT_CLINIC_OR_DEPARTMENT_OTHER)
Admission: RE | Admit: 2021-09-12 | Discharge: 2021-09-12 | Disposition: A | Discharge status: Home / Self Care | Payer: BC Managed Care – PPO | Source: Ambulatory Visit | Attending: Family Medicine | Admitting: Family Medicine

## 2021-09-12 ENCOUNTER — Ambulatory Visit: Payer: BC Managed Care – PPO | Admitting: Orthopaedic Surgery

## 2021-09-12 ENCOUNTER — Ambulatory Visit (INDEPENDENT_AMBULATORY_CARE_PROVIDER_SITE_OTHER): Payer: BC Managed Care – PPO | Admitting: Family Medicine

## 2021-09-12 DIAGNOSIS — Z23 Encounter for immunization: Secondary | ICD-10-CM | POA: Diagnosis not present

## 2021-09-12 DIAGNOSIS — Z2911 Encounter for prophylactic immunotherapy for respiratory syncytial virus (RSV): Secondary | ICD-10-CM | POA: Diagnosis not present

## 2021-09-12 DIAGNOSIS — E785 Hyperlipidemia, unspecified: Secondary | ICD-10-CM | POA: Insufficient documentation

## 2021-09-12 NOTE — Progress Notes (Signed)
Louis [name] is a 66 y.o. male presents to the office today for RSV injections. Administered by Mallie Darting, patient tolerated well.   [name]

## 2021-09-21 ENCOUNTER — Other Ambulatory Visit: Payer: Self-pay | Admitting: Family Medicine

## 2021-09-21 DIAGNOSIS — N281 Cyst of kidney, acquired: Secondary | ICD-10-CM

## 2021-09-21 DIAGNOSIS — I712 Thoracic aortic aneurysm, without rupture, unspecified: Secondary | ICD-10-CM

## 2021-09-21 DIAGNOSIS — E785 Hyperlipidemia, unspecified: Secondary | ICD-10-CM

## 2021-09-21 MED ORDER — ATORVASTATIN CALCIUM 10 MG PO TABS: ORAL_TABLET | ORAL | 1 refills | Status: DC

## 2021-09-22 ENCOUNTER — Ambulatory Visit: Payer: BC Managed Care – PPO | Admitting: Podiatry

## 2021-09-26 DIAGNOSIS — M25569 Pain in unspecified knee: Secondary | ICD-10-CM | POA: Diagnosis not present

## 2021-10-03 ENCOUNTER — Encounter: Payer: Self-pay | Admitting: Family Medicine

## 2021-10-04 DIAGNOSIS — J9601 Acute respiratory failure with hypoxia: Secondary | ICD-10-CM | POA: Diagnosis not present

## 2021-10-04 DIAGNOSIS — F1020 Alcohol dependence, uncomplicated: Secondary | ICD-10-CM | POA: Diagnosis not present

## 2021-10-04 MED ORDER — METOPROLOL SUCCINATE ER 25 MG PO TB24: 25.0000 mg | ORAL_TABLET | Freq: Every day | ORAL | 1 refills | Status: DC

## 2021-10-06 NOTE — Progress Notes (Unsigned)
[address]       [place] [address]             [phone]        Louis [name] [id number] Aug 24, 1955  History of Present Illness: Mr. Frampton is a 66 year old male with a history of hypertension, hyperlipidemia, aortic atherosclerosis, CHF, hyponatremia, neuropathy, B12 deficiency, OSA requiring CPAP, restrictive lung disease due to severe scoliosis, enterococcal bacteremia, and alcohol abuse in remission. He presents today referred by Dr. Carlota Raspberry for a 4.1cm ascending aortic aneurysm incidentally found on CT cardiac scoring test, calcium score was 4.67. CT also showed left sided atelectasis as well as calcified hilar lymph nodes and evidence of prior granulomatous disease. CTA chest in 2019 and 2021 did not mention an ascending aortic aneurysm.  He is currently taking Toprol XL, Hyzaar and Norvasc for hypertension as well as atorvastatin for hyperlipidemia. He is closely followed by his PCP Dr. Carlota Raspberry. Today he denies chest pain, chest tightness, shortness of breath, dizziness, and syncope. He does not have a history of connective tissue disease. He does report that his maternal grandmother had a cardiac event in her 54's that killed her but he is unsure as to what it was otherwise he is unaware of a family history of TAA. Echo on 06/29/19 shows a tricuspid aortic valve.     Past Medical History:  Diagnosis Date   Alcohol use disorder, mild, abuse    Anemia    Anxiety    situational   Arthritis    Phreesia 02/21/2020   Asthma    Phreesia 02/21/2020   BPH (benign prostatic hyperplasia)    on flomax- Patien tand wife denies- and patient has never been on Flomax   Bundle branch block    per prior PCP records   CHF (congestive heart failure) ([place])    Chronic kidney disease    Acute Renal Failure- June 2021- admission- kidneys recovered quickly   Complication of anesthesia 03/11/2019   woke up before surgery was over- "paralytic"   COPD (chronic  obstructive pulmonary disease) ([place])    Diverticulosis    by colonoscopy   Eczema    per prior PCP records   Elevated PSA 2015   peaked 6s, s/p benign biopsy 2014, sees urology [name]   Essential tremor    GERD (gastroesophageal reflux disease)    per prior PCP records   History of panic attacks    per prior PCP records   HTN (hypertension)    Hypertension    Phreesia 02/21/2020   Mild intermittent asthma in adult without complication    Obesity, Class I, BMI 30-34.9    Osteoporosis    DEXA 06/2013 with osteopenia - h/o ?wrist/hip fracture from AVN from chronic steroid use (asthma), took reclast for 1 year   Pneumonia    10/08/19 >10 years ago   Seasonal allergies    Sleep apnea    Substance abuse ([place])    Phreesia 02/21/2020   Thoracic scoliosis childhood   Transaminitis    per prior PCP records   Vitamin B 12 deficiency      Current Outpatient Medications on File Prior to Visit  Medication Sig Dispense Refill   acetaminophen (TYLENOL) 500 MG tablet Take 500 mg by mouth every 6 (six) hours as needed for mild pain.     amLODipine (NORVASC) 10 MG tablet TAKE 1 TABLET(10 MG) BY MOUTH DAILY 90 tablet 2   atorvastatin (LIPITOR)  10 MG tablet Take 1 by mouth 2-3 days per week. 30 tablet 1   diclofenac Sodium (VOLTAREN) 1 % GEL Apply 1 application topically 4 (four) times daily as needed (knee pain).     gabapentin (NEURONTIN) 300 MG capsule TAKE 1 CAPSULE(300 MG) BY MOUTH THREE TIMES DAILY 270 capsule 1   losartan-hydrochlorothiazide (HYZAAR) 100-12.5 MG tablet Take 1 tablet by mouth daily. 90 tablet 2   metoprolol succinate (TOPROL-XL) 25 MG 24 hr tablet Take 1 tablet (25 mg total) by mouth daily. 90 tablet 1   Multiple Vitamin (MULTIVITAMIN WITH MINERALS) TABS tablet Take 1 tablet by mouth daily.     olopatadine (PATANOL) 0.1 % ophthalmic solution Place 1 drop into both eyes 2 (two) times daily as needed for allergies.     omeprazole (PRILOSEC) 20 MG capsule Take 20  mg by mouth daily.     No current facility-administered medications on file prior to visit.   Vitals: Today's Vitals   10/10/21 1308  BP: 129/73  Pulse: 61  SpO2: 95%  Weight: 195 lb 1.6 oz (88.5 kg)   Body mass index is 27.21 kg/m.  Review of Systems  Constitutional:  Negative for chills, fever, malaise/fatigue and weight loss.  HENT:  Positive for hearing loss and tinnitus.   Respiratory:  Negative for cough, sputum production, shortness of breath and wheezing.        CPAP at night   Cardiovascular:  Positive for leg swelling. Negative for chest pain, palpitations and orthopnea.  Gastrointestinal:  Negative for abdominal pain, heartburn and nausea.  Musculoskeletal:  Negative for falls.       Neuropathy   Neurological:  Negative for dizziness, loss of consciousness and headaches.  Endo/Heme/Allergies:  Does not bruise/bleed easily.    Physical Exam: Gen: Alert, no distress Neuro: Grossly intact Back: Severe scoliosis Neck: No carotid bruits CV: Regular rate and rhythm, no murmur, rub, or gallop Pulm: Diminished breath sounds at bases, otherwise clear to auscultation GI: Normal bowel sounds, nontender, nondistended Extremities/pulses: Equal radial pulses bilaterally, no edema   CT cardiac score results:  ADDENDUM REPORT: 09/13/2021 09:32   EXAM: OVER-READ INTERPRETATION  CT CHEST   The following report is an over-read performed by radiologist Dr. [place] [facility] on 09/13/2021. This over-read does not include interpretation of cardiac or coronary anatomy or pathology. The coronary calcium score interpretation by the cardiologist is attached.   COMPARISON:  06/25/2019   FINDINGS: Aneurysmal dilatation ascending thoracic aorta 4.1 cm transverse image 3. No pericardial effusion. Atherosclerotic calcification aorta. Esophagus unremarkable. No adenopathy. Calcified BILATERAL hilar nodes. Severe dextroconvex rotatory scoliosis with  atelectasis at LEFT lung base and linear scarring at RIGHT base. No infiltrate, pleural effusion, or pneumothorax. Small posterior RIGHT diaphragmatic defect Bochdalek's type containing fat. Cyst at upper pole LEFT kidney 5.2 x 3.5 cm previously 4.6 x 34.0 cm. Additional tiny exophytic nodule posterior upper pole LEFT kidney, 9 mm diameter, intermediate attenuation, slightly increased. Remaining visualized upper abdomen unremarkable. No acute osseous findings.   IMPRESSION: Aneurysmal dilatation of the ascending thoracic aorta 4.1 cm transverse; Recommend annual imaging followup by CTA or MRA. This recommendation follows 2010 ACCF/AHA/AATS/ACR/ASA/SCA/SCAI/SIR/STS/SVM Guidelines for the Diagnosis and Management of Patients with Thoracic Aortic Disease. Circulation. 2010; 121: U202-R427. Aortic aneurysm NOS (ICD10-I71.9) .   Evidence of prior granulomatous disease.   Slight interval increase in size of cyst at upper pole LEFT kidney;.   Additional 11 mm diameter intermediate attenuation nodule posterior upper pole LEFT kidney, slightly  increased.   Renal ultrasound recommended to determine if the exophytic intermediate attenuation 11 mm nodule is solid or represents a high density/hemorrhagic/proteinaceous cyst.   Severe dextroconvex rotatory scoliosis with atelectasis at LEFT lung base.   Aortic Atherosclerosis (ICD10-I70.0).     Electronically Signed   By: Lavonia Dana M.D.   On: 09/13/2021 09:32      CLINICAL DATA:  Cardiovascular Disease Risk stratification   EXAM: Coronary Calcium Score   TECHNIQUE: A gated, non-contrast computed tomography scan of the heart was performed using 24m slice thickness. Axial images were analyzed on a dedicated workstation. Calcium scoring of the coronary arteries was performed using the Agatston method.   FINDINGS: Coronary Calcium Score:   Left main: 0   Left anterior descending artery: 4.67   Left circumflex artery: 0    Right coronary artery: 0   Total: 4.67   Percentile: 26   Pericardium: Normal.   Ascending Aorta: Normal caliber.  Aortic atherosclerosis.   Mildly dilated pulmonary artery (3.2 cm).   [facility]cardiac: [facility]   IMPRESSION: Coronary calcium score of 4.67. This was 250percentile for age-, race-, and sex-matched controls.   Aortic atherosclerosis.   Mildly dilated pulmonary artery (3.2 cm)   RECOMMENDATIONS: Coronary artery calcium (CAC) score is a strong predictor of incident coronary heart disease (CHD) and provides predictive information beyond traditional risk factors. CAC scoring is reasonable to use in the decision to withhold, postpone, or initiate statin therapy in intermediate-risk or selected borderline-risk asymptomatic adults (age 10751-75years and LDL-C >=70 to <190 mg/dL) who do not have diabetes or established atherosclerotic cardiovascular disease (ASCVD).* In intermediate-risk (10-year ASCVD risk >=7.5% to <20%) adults or selected borderline-risk (10-year ASCVD risk >=5% to <7.5%) adults in whom a CAC score is measured for the purpose of making a treatment decision the following recommendations have been made:   If CAC=0, it is reasonable to withhold statin therapy and reassess in 5 to 10 years, as long as higher risk conditions are absent (diabetes mellitus, family history of premature CHD in first degree relatives (males <55 years; females <65 years), cigarette smoking, or LDL >=190 mg/dL).   If CAC is 1 to 99, it is reasonable to initiate statin therapy for patients >=546years of age.   If CAC is >=100 or >=75th percentile, it is reasonable to initiate statin therapy at any age.   Cardiology referral should be considered for patients with CAC scores >=400 or >=75th percentile.   *2018 AHA/ACC/AACVPR/AAPA/ABC/ACPM/ADA/AGS/APhA/ASPC/NLA/PCNA Guideline on the Management of Blood Cholesterol: A Report of  the American College of Cardiology/American Heart Association Task Force on Clinical Practice Guidelines. J Am Coll Cardiol. 2019;73(24):3168-3209.   BKirk Ruths MD   Electronically Signed: By: BKirk RuthsM.D. On: 09/12/2021 17:01    A/P:  Ascending Aortic Aneurysm: Mr. SCeasepresents with a 4.1cm ascending aortic aneurysm. He denies chest pain or pressure, shortness of breath, dizziness and syncope. He does not meet surgical criteria of 5.5cm, will plan annual surveillance. Return to clinic in 1 year with CTA.   Hypertension: On Toprol XL '25mg'$ , Hyzaar 100-12.'5mg'$ , and Norvasc '10mg'$ . His Toprol XL was recently decreased due to bradycardia, HR 61 today. Blood pressure is 129/73 today, well controlled. Closely followed by primary care, PCP can continue to monitor.    Hyperlipidemia: Continue atorvastatin to decrease cardiovascular risk. He is followed closely by primary care, PCP can continue to monitor.  Atelectasis: Atelectasis likely due to restrictive lung disease caused  by severe scoliosis. He is followed by pulmonology, plan to continue follow ups with pulmonology.  OSA: Continue CPAP at night and follow ups with pulmonology.  Risk Modification:  Statin:  Atorvastatin '10mg'$   Smoking cessation instruction/counseling given: Never smoker  Patient was counseled on avoiding heavy lifting, exercises involving sustained valsalva maneuver and contact sports  Patient was counseled on importance of Blood Pressure Control.  Despite Medical intervention if the patient notices persistently elevated blood pressure readings.  They are instructed to contact their Primary Care Physician  Patient educated to avoid use of Fluoroquinolones as this can potentially increase risk of Aortic Rupture and/or Dissection  Patient educated on signs and symptoms of Aortic Dissection, handout also provided in AVS  Patient counseled on increased complication risk if 1st degree relative has TAA  disease, they have connective tissue disease or a bicuspid aortic valve  Magdalene River, PA-C 10/06/21

## 2021-10-10 ENCOUNTER — Institutional Professional Consult (permissible substitution): Payer: BC Managed Care – PPO | Admitting: Physician Assistant

## 2021-10-10 DIAGNOSIS — M25569 Pain in unspecified knee: Secondary | ICD-10-CM | POA: Diagnosis not present

## 2021-10-10 DIAGNOSIS — I712 Thoracic aortic aneurysm, without rupture, unspecified: Secondary | ICD-10-CM

## 2021-10-10 NOTE — Patient Instructions (Addendum)
Risk Modification in those with ascending thoracic aortic aneurysm:  Continue good control of blood pressure (prefer SBP 130/80 or less). Despite Medical intervention if the patient notices persistently elevated blood pressure readings contact your Primary Care Physician  2. Avoid fluoroquinolone antibiotics (I.e Ciprofloxacin, Avelox, Levofloxacin, Ofloxacin)  3.  Use of statin (to decrease cardiovascular risk)  4.  Exercise and activity limitations is individualized, but in general, contact sports are to be  avoided and one should avoid heavy lifting (defined as half of ideal body weight) and exercises involving sustained Valsalva maneuver.  5. Counseling for those suspected of having genetically mediated disease. First-degree relatives of those with TAA disease should be screened as well as those who have a connective tissue disease (I.e with Marfan syndrome, Ehlers-Danlos syndrome,  and Loeys-Dietz syndrome) or a  bicuspid aortic valve,have an increased risk for  complications related to TAA  6. If one has tobacco abuse, smoking cessation is highly encouraged.

## 2021-10-11 ENCOUNTER — Ambulatory Visit
Admission: RE | Admit: 2021-10-11 | Discharge: 2021-10-11 | Disposition: A | Discharge status: Home / Self Care | Payer: BC Managed Care – PPO | Source: Ambulatory Visit | Attending: Family Medicine | Admitting: Family Medicine

## 2021-10-11 DIAGNOSIS — N400 Enlarged prostate without lower urinary tract symptoms: Secondary | ICD-10-CM | POA: Diagnosis not present

## 2021-10-11 DIAGNOSIS — N281 Cyst of kidney, acquired: Secondary | ICD-10-CM

## 2021-10-11 DIAGNOSIS — N1330 Unspecified hydronephrosis: Secondary | ICD-10-CM | POA: Diagnosis not present

## 2021-10-12 DIAGNOSIS — G4733 Obstructive sleep apnea (adult) (pediatric): Secondary | ICD-10-CM | POA: Diagnosis not present

## 2021-10-14 ENCOUNTER — Encounter: Payer: Self-pay | Admitting: Family Medicine

## 2021-10-16 NOTE — Telephone Encounter (Signed)
Patient reports he has an appointment to see uro in December is this soon enough?   Happy to send back your response if you would like

## 2021-11-01 NOTE — Telephone Encounter (Signed)
Patient reports elevated BP 137/75 and is asking for advice, if he should adjust medication at this time.   Please advise

## 2021-11-02 ENCOUNTER — Other Ambulatory Visit: Payer: Self-pay | Admitting: Family Medicine

## 2021-11-02 DIAGNOSIS — E538 Deficiency of other specified B group vitamins: Secondary | ICD-10-CM

## 2021-11-02 DIAGNOSIS — G629 Polyneuropathy, unspecified: Secondary | ICD-10-CM

## 2021-11-02 DIAGNOSIS — I10 Essential (primary) hypertension: Secondary | ICD-10-CM

## 2021-11-04 DIAGNOSIS — J9601 Acute respiratory failure with hypoxia: Secondary | ICD-10-CM | POA: Diagnosis not present

## 2021-11-06 ENCOUNTER — Ambulatory Visit: Payer: BC Managed Care – PPO | Attending: Physician Assistant | Admitting: Physician Assistant

## 2021-11-06 ENCOUNTER — Encounter: Payer: Self-pay | Admitting: Physician Assistant

## 2021-11-06 VITALS — BP 115/62 | HR 62 | Ht 71.0 in | Wt 193.6 lb

## 2021-11-06 DIAGNOSIS — I10 Essential (primary) hypertension: Secondary | ICD-10-CM | POA: Diagnosis not present

## 2021-11-06 NOTE — Progress Notes (Unsigned)
Cardiology Office Note:    Date:  11/08/2021   ID:  Louis [name], [name] 1955/02/19, MRN [id number]  PCP:  Wendie Agreste, MD   [facility] Cardiologist:  Shelva Majestic, MD     Referring MD: Wendie Agreste, MD   Chief Complaint  Patient presents with   Follow-up    Seen for Dr. Claiborne Billings    History of Present Illness:    Louis [name] is a 66 y.o. male with a hx of BPH, COPD, OSA, hypertension, severe scoliosis and obesity.  Previous Echo in February 2011 showed a normal LV size, EF greater than 08%, normal diastolic parameters, mild MR and trace tricuspid insufficiency.  Patient was referred to cardiology service again in September 2019 for increasing dyspnea.  Prior to that, patient was hospitalized following a trip to [place] in July 2019.  He developed a significant hypoxia and hypercarbia required transient BiPAP therapy.  CT angiogram of the chest showed a severe scoliosis and suggestion of pulmonary hypertension however no PE.  There was also evidence of aortic atherosclerosis.  Echocardiogram suggested normal systolic function, grade 2 DD, decreased RV function with normal PA pressure.  He subsequently underwent sleep study and found to have severe obstructive sleep apnea and started on CPAP therapy.  He is being followed by [name] from pulmonary standpoint.  PFT obtained in August 2019 showed FEV1 of 61%, FVC 65% without significant bronchodilator response.  He had a ruptured bowel in January 2021 that was fixed by Dr. Lysle Pearl.  He underwent exploratory laparotomy and abdominal washout with [name] repair of perforated duodenal ulcer.  I last saw the patient in January 2021 via virtual visit.  He was admitted to the hospital in June 2021 with sepsis secondary to Enterococcus in the bloodstream and the urine.  TEE performed on 06/29/2019 showed EF 60 to 65%, no regional wall motion abnormality, RV systolic function was normal, no evidence of endocarditis,  mild to moderate MR, no mention of diastolic dysfunction or LVH.  He was discharged to inpatient rehab on 07/01/2019, however had respiratory hypercarbic arrest and was transferred to the ICU and intubated and diuresed.  He was quickly extubated 26 hours later.  He was not placed on the CPAP on the night prior to his respiratory arrest and he had significant hypercarbia.  After the hospitalization, he has been compliant with CPAP therapy.  He was seen by Dr. Claiborne Billings on 07/29/2019 at which time he was doing well.  He has been lost to follow-up since.  He had exploratory laparotomy and lysis of adhesions and hernia repair on 10/15/2019.  He had a coronary calcium score performed on 09/12/2021 that revealed a 4.1 cm transverse ascending aortic aneurysm, multiple cyst in the kidney.  His coronary calcium score was 4.67 which placed the patient at the 26 percentile for age and sex matched control.  Renal ultrasound performed on 10/11/2021 showed moderate hydronephrosis of the right kidney which appears to diminished on the postvoid views, bilateral renal cyst.  Patient has been followed by urology service.  Patient presents today for follow-up.  He was concerned that his recent heart rate was dipping down below 60s, therefore metoprolol succinate was cut down to 25 mg daily, however his systolic blood pressure was trending up to 140s afterward.  With his thoracic aortic aneurysm, he was concerned about elevated blood pressure.  I reassured the patient, resting heart rate should be between 50-100.  As long as his  heart rate is above 50 bpm, we are fine with having a higher dose of metoprolol to help control the blood pressure.  I have decided to increase his blood pressure medication metoprolol succinate back up to 50 mg daily.  He denies any recent dizziness, blurred vision or feeling of passing out.  The only reason he previously decreased her metoprolol dose was due to concern of heart rate down to the 50s.  He has not  noticed any heart rate down to the 40s when he is awake.  He denies any recent exertional chest pain or worsening dyspnea.  There is some confusion's both losartan 100 mg daily and losartan-HCTZ 100-12.5 mg daily was listed on his medication list.  He is not sure which one he is taking.  Once he get home, he will check his medication list and give [place] a call.  Otherwise, he can follow-up with Dr. Claiborne Billings in 6 months, earlier if blood pressure remain high.  He is aware that his systolic blood pressure goal should be between 100-129.    Past Medical History:  Diagnosis Date   Alcohol use disorder, mild, abuse    Anemia    Anxiety    situational   Arthritis    Phreesia 02/21/2020   Asthma    Phreesia 02/21/2020   BPH (benign prostatic hyperplasia)    on flomax- Patien tand wife denies- and patient has never been on Flomax   Bundle branch block    per prior PCP records   CHF (congestive heart failure) ([place])    Chronic kidney disease    Acute Renal Failure- June 2021- admission- kidneys recovered quickly   Complication of anesthesia 03/11/2019   woke up before surgery was over- "paralytic"   COPD (chronic obstructive pulmonary disease) ([place])    Diverticulosis    by colonoscopy   Eczema    per prior PCP records   Elevated PSA 2015   peaked 6s, s/p benign biopsy 2014, sees urology [name]   Essential tremor    GERD (gastroesophageal reflux disease)    per prior PCP records   History of panic attacks    per prior PCP records   HTN (hypertension)    Hypertension    Phreesia 02/21/2020   Mild intermittent asthma in adult without complication    Obesity, Class I, BMI 30-34.9    Osteoporosis    DEXA 06/2013 with osteopenia - h/o ?wrist/hip fracture from AVN from chronic steroid use (asthma), took reclast for 1 year   Pneumonia    10/08/19 >10 years ago   Seasonal allergies    Sleep apnea    Substance abuse ([place])    Phreesia 02/21/2020   Thoracic scoliosis childhood    Transaminitis    per prior PCP records   Vitamin B 12 deficiency     Past Surgical History:  Procedure Laterality Date   APPLICATION OF WOUND VAC Left 05/09/2018   Procedure: Application Of Wound Vac;  Surgeon: Newt Minion, MD;  Location: [facility];  Service: Orthopedics;  Laterality: Left;   BIOPSY  04/30/2019   Procedure: BIOPSY;  Surgeon: Juanita Craver, MD;  Location: [facility];  Service: Endoscopy;;   BROW LIFT Bilateral 04/12/2021   Procedure: UPPER LID BLEPHAROPLASTY;  Surgeon: Wallace Going, DO;  Location: [facility];  Service: Plastics;  Laterality: Bilateral;   COLONOSCOPY  12/2013   polyps, int hem, diverticulosis, rpt 5 yrs [name])   COLONOSCOPY WITH PROPOFOL N/A 04/30/2019   Procedure:  COLONOSCOPY WITH PROPOFOL;  Surgeon: Juanita Craver, MD;  [facility];  Service: Endoscopy;  Laterality: N/A;   ELBOW SURGERY Right 2008   golfer's elbow   ESOPHAGOGASTRODUODENOSCOPY (EGD) WITH PROPOFOL N/A 04/30/2019   Procedure: ESOPHAGOGASTRODUODENOSCOPY (EGD) WITH PROPOFOL;  Surgeon: Juanita Craver, MD;  [facility];  Service: Endoscopy;  Laterality: N/A;   HERNIA REPAIR N/A    Phreesia 02/21/2020   INCISIONAL HERNIA REPAIR N/A 10/15/2019   Procedure: HERNIA REPAIR INCISIONAL WITH MESH, TAR;  Surgeon: Ralene Ok, MD;  Location: [facility];  Service: General;  Laterality: N/A;   INGUINAL HERNIA REPAIR Bilateral childhood & ~1995   LAPAROTOMY N/A 01/05/2019   Procedure: EXPLORATORY LAPAROTOMY graham patch repair;  Surgeon: Benjamine Sprague, DO;  Location: [facility];  Service: General;  Laterality: N/A;   LAPAROTOMY N/A 10/15/2019   Procedure: EXPLORATORY LAPAROTOMY;  Surgeon: Ralene Ok, MD;  Location: [facility];  Service: General;  Laterality: N/A;   LYSIS OF ADHESION N/A 10/15/2019   Procedure: LYSIS OF ADHESION;  Surgeon: Ralene Ok, MD;  Location: [facility];  Service: General;  Laterality: N/A;   NASAL SEPTUM SURGERY  2013   deviated septum   ORIF  ANKLE FRACTURE Left 05/09/2018   Procedure: OPEN REDUCTION INTERNAL FIXATION LEFT LISFRANC FRACTURE/DISLOCATION;  Surgeon: Newt Minion, MD;  Location: [facility];  Service: Orthopedics;  Laterality: Left;   POLYPECTOMY  04/30/2019   Procedure: POLYPECTOMY;  Surgeon: Juanita Craver, MD;  [facility];  Service: Endoscopy;;   TEE WITHOUT CARDIOVERSION N/A 06/29/2019   Procedure: TRANSESOPHAGEAL ECHOCARDIOGRAM (TEE);  Surgeon: Acie Fredrickson Wonda Cheng, MD;  Location: [facility];  Service: Cardiovascular;  Laterality: N/A;   US ECHOCARDIOGRAPHY  02/2009   WNL, EF >55% Claiborne Billings)   US RENAL/AORTA Right 05/2009   1-59% diameter reduction renal artery, rec rpt 2 yrs   VASECTOMY N/A    Phreesia 02/21/2020   WEIL OSTEOTOMY Left 11/16/2020   Procedure: WEIL OSTEOTOMY LEFT 2ND METATARSAL,  2ND TOE DISTAL INTERPHALANGEAL RESECTION AND [name] OSTEOTOMY WEIL OSTEOTOMY THIRD METATARSAL;  Surgeon: Newt Minion, MD;  Location: [facility];  Service: Orthopedics;  Laterality: Left;    Current Medications: Current Meds  Medication Sig   acetaminophen (TYLENOL) 500 MG tablet Take 500 mg by mouth every 6 (six) hours as needed for mild pain.   amLODipine (NORVASC) 10 MG tablet TAKE 1 TABLET(10 MG) BY MOUTH DAILY   atorvastatin (LIPITOR) 10 MG tablet Take 1 by mouth 2-3 days per week.   diclofenac Sodium (VOLTAREN) 1 % GEL Apply 1 application topically 4 (four) times daily as needed (knee pain).   gabapentin (NEURONTIN) 300 MG capsule TAKE 1 CAPSULE(300 MG) BY MOUTH THREE TIMES DAILY   losartan (COZAAR) 100 MG tablet TAKE 1 TABLET(100 MG) BY MOUTH DAILY   losartan-hydrochlorothiazide (HYZAAR) 100-12.5 MG tablet Take 1 tablet by mouth daily.   metoprolol succinate (TOPROL-XL) 50 MG 24 hr tablet TAKE 1 TABLET(50 MG) BY MOUTH DAILY WITH A MEAL   Multiple Vitamin (MULTIVITAMIN WITH MINERALS) TABS tablet Take 1 tablet by mouth daily.   olopatadine (PATANOL) 0.1 % ophthalmic solution Place 1 drop into both eyes 2 (two) times daily  as needed for allergies.   omeprazole (PRILOSEC) 20 MG capsule Take 20 mg by mouth daily.   [DISCONTINUED] metoprolol succinate (TOPROL-XL) 25 MG 24 hr tablet Take 1 tablet (25 mg total) by mouth daily.     Allergies:   Accupril [quinapril hcl] and Nsaids   Social History   Socioeconomic History  Marital status: Married    Spouse name: Not on file   Number of children: Not on file   Years of education: Not on file   Highest education level: Not on file  Occupational History   Not on file  Tobacco Use   Smoking status: Never   Smokeless tobacco: Never  Vaping Use   Vaping Use: Never used  Substance and Sexual Activity   Alcohol use: Not Currently   Drug use: No   Sexual activity: Yes    Birth control/protection: None  Other Topics Concern   Not on file  Social History Narrative   Lives with wife (retired Materials engineer), 82yo dog died 04-03-14   Grown children   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, was Chief Executive Officer   Edu: [employer], masters in Investment banker, operational    Activity: TRX and cardio and pilates   Diet: good water, fruits/vegetables daily   Social Determinants of Radio broadcast assistant Strain: Not on Art therapist Insecurity: Not on file  Transportation Needs: Not on file  Physical Activity: Not on file  Stress: Not on file  Social Connections: Not on file     Family History: The patient's family history includes Alcohol abuse in his mother; Ataxia in his brother and sister; Cancer in his maternal grandmother; Cancer (age of onset: 36) in his father; Diabetes in his mother; Prostate cancer in his father; Stroke in his mother. There is no history of CAD.  ROS:   Please see the history of present illness.     All other systems reviewed and are negative.  EKGs/Labs/Other Studies Reviewed:    The following studies were reviewed today:  TEE 06/29/2019  1. Left ventricular ejection fraction, by estimation, is 60 to 65%. The  left ventricle has normal function. The left ventricle has no  regional  wall motion abnormalities.   2. Right ventricular systolic function is normal. The right ventricular  size is normal.   3. No left atrial/left atrial appendage thrombus was detected.   4. The mitral valve is grossly normal. Mild to moderate mitral valve  regurgitation. No evidence of mitral stenosis.   5. The aortic valve is normal in structure. Aortic valve regurgitation is  trivial. No aortic stenosis is present.   EKG:  EKG is ordered today.  The ekg ordered today demonstrates normal sinus rhythm, no significant ST-T wave changes.  Recent Labs: 11/16/2020: Hemoglobin 12.8; Platelets 304 08/02/2021: ALT 14; BUN 19; Creatinine, Ser 0.90; Potassium 3.9; Sodium 135  Recent Lipid Panel    Component Value Date/Time   CHOL 183 08/02/2021 1634   CHOL 180 01/11/2020 1051   CHOL 204 10/11/2014 0000   TRIG 60.0 08/02/2021 1634   TRIG 51 10/11/2014 0000   HDL 53.50 08/02/2021 1634   HDL 123 01/11/2020 1051   HDL 75 10/11/2014 0000   CHOLHDL 3 08/02/2021 1634   VLDL 12.0 08/02/2021 1634   LDLCALC 117 (H) 08/02/2021 1634   LDLCALC 50 01/11/2020 1051   LDLCALC 119 10/11/2014 0000     Risk Assessment/Calculations:           Physical Exam:    VS:  BP 115/62   Pulse 62   Ht '5\' 11"'$  (1.803 m)   Wt 193 lb 9.6 oz (87.8 kg)   SpO2 97%   BMI 27.00 kg/m        Wt Readings from Last 3 Encounters:  11/06/21 193 lb 9.6 oz (87.8 kg)  10/10/21 195 lb 1.6 oz (88.5 kg)  08/02/21 191 lb 3.2 oz (86.7 kg)     GEN:  Well nourished, well developed in no acute distress HEENT: Normal NECK: No JVD; No carotid bruits LYMPHATICS: No lymphadenopathy CARDIAC: RRR, no murmurs, rubs, gallops RESPIRATORY:  Clear to auscultation without rales, wheezing or rhonchi  ABDOMEN: Soft, non-tender, non-distended MUSCULOSKELETAL:  No edema; No deformity  SKIN: Warm and dry NEUROLOGIC:  Alert and oriented x 3 PSYCHIATRIC:  Normal affect   ASSESSMENT:    1. Essential hypertension    PLAN:     In order of problems listed above:  Hypertension: His metoprolol succinate was recently reduced to 25 mg daily due to concern that his heart rate has been dipping below 60 bpm.  I reassured the patient that most people's heart rate should be between 50-100, he has not noticed any extremely slow heart rate in the 30s or 40s.  He does not have any symptoms associated with heart rate in the 50s.  Since reducing the dose of metoprolol succinate, his blood pressure has been higher, I recommend that he go back to 50 mg daily of metoprolol succinate.  It is unclear to me if he is taking losartan or losartan-HCTZ as both medication were listed.  He was going to check his medication box at home and let us know.           Medication Adjustments/Labs and Tests Ordered: Current medicines are reviewed at length with the patient today.  Concerns regarding medicines are outlined above.  Orders Placed This Encounter  Procedures   EKG 12-Lead   No orders of the defined types were placed in this encounter.   Patient Instructions  Medication Instructions:   When you arrive home please check your medication bottles and give office a call with correct medications   *If you need a refill on your cardiac medications before your next appointment, please call your pharmacy* Lab Work: NONE ordered at this time of appointment   If you have labs (blood work) drawn today and your tests are completely normal, you will receive your results only by: [facility] (if you have MyChart) OR A paper copy in the mail If you have any lab test that is abnormal or we need to change your treatment, we will call you to review the results.  Testing/Procedures: NONE ordered at this time of appointment   Follow-Up: At [facility], you and your health needs are our priority.  As part of our continuing mission to provide you with exceptional heart care, we have created designated Provider Care Teams.  These  Care Teams include your primary Cardiologist (physician) and Advanced Practice Providers (APPs -  Physician Assistants and Nurse Practitioners) who all work together to provide you with the care you need, when you need it.  Your next appointment:   6 month(s)  The format for your next appointment:   In Person  Provider:   Shelva Majestic, MD     Other Instructions Your Systolic Blood Pressure (Top number) goal is 100-129 mmHg   Important Information About Sugar         Hilbert Corrigan, [place]  11/08/2021 10:55 AM    [place]

## 2021-11-06 NOTE — Patient Instructions (Signed)
Medication Instructions:   When you arrive home please check your medication bottles and give office a call with correct medications   *If you need a refill on your cardiac medications before your next appointment, please call your pharmacy* Lab Work: NONE ordered at this time of appointment   If you have labs (blood work) drawn today and your tests are completely normal, you will receive your results only by: [facility] (if you have MyChart) OR A paper copy in the mail If you have any lab test that is abnormal or we need to change your treatment, we will call you to review the results.  Testing/Procedures: NONE ordered at this time of appointment   Follow-Up: At [facility], you and your health needs are our priority.  As part of our continuing mission to provide you with exceptional heart care, we have created designated Provider Care Teams.  These Care Teams include your primary Cardiologist (physician) and Advanced Practice Providers (APPs -  Physician Assistants and Nurse Practitioners) who all work together to provide you with the care you need, when you need it.  Your next appointment:   6 month(s)  The format for your next appointment:   In Person  Provider:   Shelva Majestic, MD     Other Instructions Your Systolic Blood Pressure (Top number) goal is 100-129 mmHg   Important Information About Sugar

## 2021-11-07 DIAGNOSIS — M25569 Pain in unspecified knee: Secondary | ICD-10-CM | POA: Diagnosis not present

## 2021-11-08 ENCOUNTER — Encounter: Payer: Self-pay | Admitting: Physician Assistant

## 2021-11-08 DIAGNOSIS — F1020 Alcohol dependence, uncomplicated: Secondary | ICD-10-CM | POA: Diagnosis not present

## 2021-11-12 DIAGNOSIS — G4733 Obstructive sleep apnea (adult) (pediatric): Secondary | ICD-10-CM | POA: Diagnosis not present

## 2021-11-21 DIAGNOSIS — M546 Pain in thoracic spine: Secondary | ICD-10-CM | POA: Diagnosis not present

## 2021-11-23 ENCOUNTER — Emergency Department (HOSPITAL_COMMUNITY): Payer: BC Managed Care – PPO

## 2021-11-23 ENCOUNTER — Emergency Department (HOSPITAL_COMMUNITY)
Admission: EM | Admit: 2021-11-23 | Discharge: 2021-11-23 | Disposition: A | Discharge status: Home / Self Care | Payer: BC Managed Care – PPO | Attending: Emergency Medicine | Admitting: Emergency Medicine

## 2021-11-23 ENCOUNTER — Other Ambulatory Visit: Payer: Self-pay

## 2021-11-23 ENCOUNTER — Encounter (HOSPITAL_COMMUNITY): Payer: Self-pay

## 2021-11-23 DIAGNOSIS — I110 Hypertensive heart disease with heart failure: Secondary | ICD-10-CM | POA: Diagnosis not present

## 2021-11-23 DIAGNOSIS — I509 Heart failure, unspecified: Secondary | ICD-10-CM | POA: Insufficient documentation

## 2021-11-23 DIAGNOSIS — Z79899 Other long term (current) drug therapy: Secondary | ICD-10-CM | POA: Diagnosis not present

## 2021-11-23 DIAGNOSIS — J449 Chronic obstructive pulmonary disease, unspecified: Secondary | ICD-10-CM | POA: Insufficient documentation

## 2021-11-23 DIAGNOSIS — R142 Eructation: Secondary | ICD-10-CM | POA: Diagnosis not present

## 2021-11-23 DIAGNOSIS — R001 Bradycardia, unspecified: Secondary | ICD-10-CM | POA: Diagnosis not present

## 2021-11-23 DIAGNOSIS — R079 Chest pain, unspecified: Secondary | ICD-10-CM | POA: Insufficient documentation

## 2021-11-23 DIAGNOSIS — R0789 Other chest pain: Secondary | ICD-10-CM | POA: Diagnosis not present

## 2021-11-23 DIAGNOSIS — R143 Flatulence: Secondary | ICD-10-CM | POA: Diagnosis not present

## 2021-11-23 HISTORY — DX: Thoracic aortic aneurysm, without rupture, unspecified: I71.20

## 2021-11-23 LAB — BASIC METABOLIC PANEL
Anion gap: 11 (ref 5–15)
BUN: 16 mg/dL (ref 8–23)
CO2: 24 mmol/L (ref 22–32)
Calcium: 9.5 mg/dL (ref 8.9–10.3)
Chloride: 100 mmol/L (ref 98–111)
Creatinine, Ser: 1.02 mg/dL (ref 0.61–1.24)
GFR, Estimated: >60 mL/min (ref >60)
Glucose, Bld: 91 mg/dL (ref 70–99)
Potassium: 3.9 mmol/L (ref 3.5–5.1)
Sodium: 135 mmol/L (ref 135–145)

## 2021-11-23 LAB — CBC
HCT: 40.3 % (ref 39.0–52.0)
Hemoglobin: 13.4 g/dL (ref 13.0–17.0)
MCH: 30.7 pg (ref 26.0–34.0)
MCHC: 33.3 g/dL (ref 30.0–36.0)
MCV: 92.2 fL (ref 80.0–100.0)
Platelets: 291 10*3/uL (ref 150–400)
RBC: 4.37 MIL/uL (ref 4.22–5.81)
RDW: 12.7 % (ref 11.5–15.5)
WBC: 7.2 10*3/uL (ref 4.0–10.5)
nRBC: 0 % (ref 0.0–0.2)

## 2021-11-23 LAB — TROPONIN I (HIGH SENSITIVITY)
Troponin I (High Sensitivity): 2 ng/L (ref <18)
Troponin I (High Sensitivity): 2 ng/L (ref <18)

## 2021-11-23 NOTE — Discharge Instructions (Addendum)
Your workup in the emergency department today was reassuring.  We recommend follow-up with your primary care doctor for reassessment of your symptoms within the week.  Continue your daily prescribed medications.  Return for any new or concerning symptoms.

## 2021-11-23 NOTE — ED Provider Notes (Signed)
[facility] EMERGENCY DEPARTMENT Provider Note   CSN: [id number] Arrival date & time: 11/23/21  0059     History  Chief Complaint  Patient presents with   Chest Pain    Louis [name] is a 66 y.o. male.  Louis [name] is a 66 y.o. male with hx of HTN, CHF, COPD, TAA, and GERD who presents today with sudden onset chest pain with increased burping and flatulence. Pain began around 11pm and began subsiding around 1:30am. Pain was rated 3/10 at its worst. Currently reports some residual pressure. He did take 2 Tums following pain onset. Pt denies pain radiation or changes to pain with exertion, N/V, diaphoresis, SOB, chills, numbness/tingling in the extremities, LE edema, and changes in bowel or bladder patterns. No known FHx of ACS, per patient.  The history is provided by the patient and the spouse. No language interpreter was used.  Chest Pain      Home Medications Prior to Admission medications   Medication Sig Start Date End Date Taking? Authorizing Provider  acetaminophen (TYLENOL) 500 MG tablet Take 500 mg by mouth every 6 (six) hours as needed for mild pain.    [provider]  amLODipine (NORVASC) 10 MG tablet TAKE 1 TABLET(10 MG) BY MOUTH DAILY 11/02/21   Wendie Agreste, MD  atorvastatin (LIPITOR) 10 MG tablet Take 1 by mouth 2-3 days per week. 09/21/21   Wendie Agreste, MD  diclofenac Sodium (VOLTAREN) 1 % GEL Apply 1 application topically 4 (four) times daily as needed (knee pain).    [provider]  gabapentin (NEURONTIN) 300 MG capsule TAKE 1 CAPSULE(300 MG) BY MOUTH THREE TIMES DAILY 11/02/21   Wendie Agreste, MD  losartan (COZAAR) 100 MG tablet TAKE 1 TABLET(100 MG) BY MOUTH DAILY 11/02/21   Wendie Agreste, MD  losartan-hydrochlorothiazide (HYZAAR) 100-12.5 MG tablet Take 1 tablet by mouth daily. 08/02/21   Wendie Agreste, MD  metoprolol succinate (TOPROL-XL) 50 MG 24 hr tablet TAKE 1 TABLET(50 MG) BY MOUTH DAILY WITH A  MEAL 11/02/21   Wendie Agreste, MD  Multiple Vitamin (MULTIVITAMIN WITH MINERALS) TABS tablet Take 1 tablet by mouth daily. 07/08/19   Dahal, Marlowe Aschoff, MD  olopatadine (PATANOL) 0.1 % ophthalmic solution Place 1 drop into both eyes 2 (two) times daily as needed for allergies.    [provider]  omeprazole (PRILOSEC) 20 MG capsule Take 20 mg by mouth daily.    [provider]      Allergies    Accupril [quinapril hcl] and Nsaids    Review of Systems   Review of Systems  Cardiovascular:  Positive for chest pain.  Ten systems reviewed and are negative for acute change, except as noted in the HPI.    Physical Exam Updated Vital Signs BP 111/68   Pulse (!) 53   Temp 97.8 F (36.6 C) (Oral)   Resp 19   Ht '5\' 11"'$  (1.803 m)   Wt 86.2 kg   SpO2 94%   BMI 26.50 kg/m   Physical Exam Vitals and nursing note reviewed.  Constitutional:      General: He is not in acute distress.    Appearance: He is well-developed. He is not diaphoretic.     Comments: Nontoxic appearing and in NAD  HENT:     Head: Normocephalic and atraumatic.  Eyes:     General: No scleral icterus.    Conjunctiva/sclera: Conjunctivae normal.  Cardiovascular:     Rate and Rhythm:  Regular rhythm. Bradycardia present.     Pulses: Normal pulses.  Pulmonary:     Effort: Pulmonary effort is normal. No respiratory distress.     Breath sounds: No stridor. No wheezing.     Comments: Respirations even and unlabored Abdominal:     Palpations: Abdomen is soft. There is no mass.     Tenderness: There is no abdominal tenderness. There is no guarding.     Comments: Soft, nontender. No peritoneal signs or palpable masses.  Musculoskeletal:        General: Normal range of motion.     Cervical back: Normal range of motion.     Right lower leg: No edema.     Left lower leg: No edema.  Skin:    General: Skin is warm and dry.     Coloration: Skin is not pale.     Findings: No erythema or rash.  Neurological:      Mental Status: He is alert and oriented to person, place, and time.     Coordination: Coordination normal.  Psychiatric:        Behavior: Behavior normal.     ED Results / Procedures / Treatments   Labs (all labs ordered are listed, but only abnormal results are displayed) Labs Reviewed  BASIC METABOLIC PANEL  CBC  TROPONIN I (HIGH SENSITIVITY)  TROPONIN I (HIGH SENSITIVITY)    EKG EKG Interpretation  Date/Time:  Thursday November 23 2021 01:01:36 EST Ventricular Rate:  53 PR Interval:  180 QRS Duration: 100 QT Interval:  440 QTC Calculation: 412 R Axis:   84 Text Interpretation: Sinus bradycardia Otherwise normal ECG When compared with ECG of 11-Apr-2021 10:17, PREVIOUS ECG IS PRESENT No significant change since last tracing Confirmed by Ripley Fraise [phone]) on 11/23/2021 1:58:01 AM  Radiology DG Chest 2 View  Result Date: 11/23/2021 CLINICAL DATA:  Chest pain EXAM: CHEST - 2 VIEW COMPARISON:  Chest x-ray 07/01/2019 FINDINGS: The heart size and mediastinal contours are within normal limits. Both lungs are clear. Marked dextroconvex scoliosis again seen. IMPRESSION: No active cardiopulmonary disease. Electronically Signed   By: Ronney Asters M.D.   On: 11/23/2021 01:40    Procedures Procedures    Medications Ordered in ED Medications - No data to display  ED Course/ Medical Decision Making/ A&P                           Medical Decision Making Amount and/or Complexity of Data Reviewed Labs: ordered. Radiology: ordered.   This patient presents to the ED for concern of chest pain, this involves an extensive number of treatment options, and is a complaint that carries with it a high risk of complications and morbidity.  The differential diagnosis includes gastritis/PUD vs esophageal spasm vs biliary colic vs ACS vs PTX vs PNA vs Boerhaave's.   Co morbidities that complicate the patient evaluation  HTN TAA COPD GERD   Additional history  obtained:  Additional history obtained from spouse at bedside External records from outside source obtained and reviewed including Cardiac CT in 09/2021 with Calcium score of 4.67.   Lab Tests:  I Ordered, and personally interpreted labs.  The pertinent results include:  Normal CBC, BMP. Negative Troponin.   Imaging Studies ordered:  I ordered imaging studies including CXR  I independently visualized and interpreted imaging which showed no acute cardiopulmonary abnormality I agree with the radiologist interpretation   Cardiac Monitoring:  The patient was maintained on a  cardiac monitor.  I personally viewed and interpreted the cardiac monitored which showed an underlying rhythm of: sinus bradycardia   Medicines ordered and prescription drug management:  Patient declines medications in the ED I have reviewed the patients home medicines and have made adjustments as needed   Test Considered:  CTA dissection study given hx of TAA; however presenting symptoms are atypical for aneurysmal rupture.   Problem List / ED Course:  Low suspicion for emergent cardiac etiology given reassuring workup today.   EKG is nonischemic and troponin negative x2.   Patient has a heart score of 3 consistent with low risk of acute coronary event.   Chest x-ray without evidence of mediastinal widening to suggest dissection or worsening TAA.  No pneumothorax, pneumonia, pleural effusion. Patient expressed most concerned about a perforated ulcer; however, there is no free air under the diaphragm or mediastinal emphysema on CXR to suggest perforated gastric or duodenal ulcer or Boerhaave's.   Reevaluation:  After the interventions noted above, I reevaluated the patient and found that they have :stayed the same   Social Determinants of Health:  Established with PCP Insured patient   Dispostion:  After consideration of the diagnostic results and the patients response to treatment, I feel that  the patent would benefit from close outpatient follow up. Hx of recent cardiac CT with low calcium score. Symptoms atypical for ACS. Question esophageal spasm, reflux. He is already prescribed a PPI. Encouraged return for new or concerning symptoms. Return precautions provided. Patient discharged in stable condition with no unaddressed concerns.         Final Clinical Impression(s) / ED Diagnoses Final diagnoses:  Nonspecific chest pain    Rx / DC Orders ED Discharge Orders     None         Antonietta Breach, PA-C 11/23/21 0455    Ripley Fraise, MD 11/24/21 [phone]

## 2021-11-23 NOTE — ED Notes (Signed)
Patient ambulatory to restroom with steady gait.

## 2021-11-23 NOTE — ED Notes (Signed)
Patient left without receiving AVS and d/c instructions.  Unable to get repeat temperature as well.  Patient seen ambulating with steady gait to exit.

## 2021-11-23 NOTE — ED Triage Notes (Signed)
Pt reports sudden onset of sharp left sided chest pain onset tonight about 11pm tonight when he was sleeping associated with increase in burping and flatulence.

## 2021-11-25 ENCOUNTER — Encounter: Payer: Self-pay | Admitting: Family Medicine

## 2021-11-28 ENCOUNTER — Other Ambulatory Visit: Payer: Self-pay

## 2021-11-28 DIAGNOSIS — E785 Hyperlipidemia, unspecified: Secondary | ICD-10-CM

## 2021-11-28 DIAGNOSIS — I10 Essential (primary) hypertension: Secondary | ICD-10-CM

## 2021-11-28 MED ORDER — METOPROLOL SUCCINATE ER 50 MG PO TB24: ORAL_TABLET | ORAL | 2 refills | Status: DC

## 2021-11-28 MED ORDER — AMLODIPINE BESYLATE 10 MG PO TABS: ORAL_TABLET | ORAL | 2 refills | Status: DC

## 2021-11-28 MED ORDER — LOSARTAN POTASSIUM 100 MG PO TABS: ORAL_TABLET | ORAL | 2 refills | Status: DC

## 2021-12-04 DIAGNOSIS — J9601 Acute respiratory failure with hypoxia: Secondary | ICD-10-CM | POA: Diagnosis not present

## 2021-12-06 DIAGNOSIS — F1020 Alcohol dependence, uncomplicated: Secondary | ICD-10-CM | POA: Diagnosis not present

## 2021-12-06 DIAGNOSIS — F419 Anxiety disorder, unspecified: Secondary | ICD-10-CM | POA: Diagnosis not present

## 2021-12-12 DIAGNOSIS — M546 Pain in thoracic spine: Secondary | ICD-10-CM | POA: Diagnosis not present

## 2021-12-12 DIAGNOSIS — G4733 Obstructive sleep apnea (adult) (pediatric): Secondary | ICD-10-CM | POA: Diagnosis not present

## 2021-12-13 DIAGNOSIS — N400 Enlarged prostate without lower urinary tract symptoms: Secondary | ICD-10-CM | POA: Diagnosis not present

## 2021-12-20 DIAGNOSIS — R972 Elevated prostate specific antigen [PSA]: Secondary | ICD-10-CM | POA: Diagnosis not present

## 2021-12-20 DIAGNOSIS — N281 Cyst of kidney, acquired: Secondary | ICD-10-CM | POA: Diagnosis not present

## 2021-12-20 DIAGNOSIS — N400 Enlarged prostate without lower urinary tract symptoms: Secondary | ICD-10-CM | POA: Diagnosis not present

## 2021-12-20 DIAGNOSIS — N130 Hydronephrosis with ureteropelvic junction obstruction: Secondary | ICD-10-CM | POA: Diagnosis not present

## 2022-01-04 DIAGNOSIS — J9601 Acute respiratory failure with hypoxia: Secondary | ICD-10-CM | POA: Diagnosis not present

## 2022-01-30 DIAGNOSIS — M546 Pain in thoracic spine: Secondary | ICD-10-CM | POA: Diagnosis not present

## 2022-02-04 DIAGNOSIS — J9601 Acute respiratory failure with hypoxia: Secondary | ICD-10-CM | POA: Diagnosis not present

## 2022-02-13 ENCOUNTER — Ambulatory Visit (INDEPENDENT_AMBULATORY_CARE_PROVIDER_SITE_OTHER): Payer: BC Managed Care – PPO | Admitting: Family Medicine

## 2022-02-13 ENCOUNTER — Encounter: Payer: Self-pay | Admitting: Family Medicine

## 2022-02-13 VITALS — BP 126/72 | HR 78 | Temp 97.8°F | Ht 71.0 in | Wt 200.8 lb

## 2022-02-13 DIAGNOSIS — G629 Polyneuropathy, unspecified: Secondary | ICD-10-CM

## 2022-02-13 DIAGNOSIS — E785 Hyperlipidemia, unspecified: Secondary | ICD-10-CM | POA: Diagnosis not present

## 2022-02-13 DIAGNOSIS — E538 Deficiency of other specified B group vitamins: Secondary | ICD-10-CM | POA: Diagnosis not present

## 2022-02-13 DIAGNOSIS — L299 Pruritus, unspecified: Secondary | ICD-10-CM | POA: Diagnosis not present

## 2022-02-13 DIAGNOSIS — I10 Essential (primary) hypertension: Secondary | ICD-10-CM

## 2022-02-13 DIAGNOSIS — H9201 Otalgia, right ear: Secondary | ICD-10-CM

## 2022-02-13 LAB — COMPREHENSIVE METABOLIC PANEL
ALT: 13 U/L (ref 0–53)
AST: 16 U/L (ref 0–37)
Albumin: 4.5 g/dL (ref 3.5–5.2)
Alkaline Phosphatase: 67 U/L (ref 39–117)
BUN: 14 mg/dL (ref 6–23)
CO2: 31 mEq/L (ref 19–32)
Calcium: 9.7 mg/dL (ref 8.4–10.5)
Chloride: 98 mEq/L (ref 96–112)
Creatinine, Ser: 0.9 mg/dL (ref 0.40–1.50)
GFR: 90.18 mL/min (ref >60.00)
Glucose, Bld: 87 mg/dL (ref 70–99)
Potassium: 4 mEq/L (ref 3.5–5.1)
Sodium: 134 mEq/L — ABNORMAL LOW (ref 135–145)
Total Bilirubin: 0.9 mg/dL (ref 0.2–1.2)
Total Protein: 7.2 g/dL (ref 6.0–8.3)

## 2022-02-13 LAB — LIPID PANEL
Cholesterol: 135 mg/dL (ref 0–200)
HDL: 52.7 mg/dL (ref >39.00)
LDL Cholesterol: 73 mg/dL (ref 0–99)
NonHDL: 82.69
Total CHOL/HDL Ratio: 3
Triglycerides: 49 mg/dL (ref 0.0–149.0)
VLDL: 9.8 mg/dL (ref 0.0–40.0)

## 2022-02-13 MED ORDER — AMLODIPINE BESYLATE 10 MG PO TABS: ORAL_TABLET | ORAL | 2 refills | Status: DC

## 2022-02-13 MED ORDER — NEOMYCIN-POLYMYXIN-HC 3.5-10000-1 OT SOLN: 3.0000 [drp] | Freq: Three times a day (TID) | OTIC | 0 refills | Status: DC

## 2022-02-13 MED ORDER — METOPROLOL SUCCINATE ER 50 MG PO TB24: ORAL_TABLET | ORAL | 2 refills | Status: DC

## 2022-02-13 MED ORDER — LOSARTAN POTASSIUM-HCTZ 100-12.5 MG PO TABS: 1.0000 | ORAL_TABLET | Freq: Every day | ORAL | 2 refills | Status: DC

## 2022-02-13 MED ORDER — GABAPENTIN 300 MG PO CAPS: ORAL_CAPSULE | ORAL | 1 refills | Status: DC

## 2022-02-13 MED ORDER — ATORVASTATIN CALCIUM 10 MG PO TABS: ORAL_TABLET | ORAL | 1 refills | Status: DC

## 2022-02-13 NOTE — Progress Notes (Signed)
Subjective:  [name], male    DOB: [birth year]  Age: 67 y.o. MRN: [id number]  CC:  Chief Complaint  Patient presents with   Hyperlipidemia   Hypertension    HPI Louis [name] presents for  Follow up. No new concerns.   Hypertension: Losartan HCTZ 100/12.5 mg daily, Toprol-XL 50 mg daily, amlodipine 10 mg daily.  Cardiology appointment November 6.  Previous lower dose of metoprolol for lower heart rates, but blood pressure increased, restarted the 50 mg dose.  Asymptomatic in the 89s prior. Still at 53's - 60's at times - asx.  Home readings: none recent.  BP Readings from Last 3 Encounters:  02/13/22 126/72  11/23/21 125/69  11/06/21 115/62   Lab Results  Component Value Date   CREATININE 1.02 11/23/2021   Peripheral neuropathy Has been followed by orthopedic foot specialist, history of vitamin B12 deficiency.  Normal on more recent testing.  Treated with gabapentin 300 mg 2 in the morning, 1 later in the day as needed and on a multivitamin with B12. Stable with current gabapentin dose and no new side effects.   Lab Results  Component Value Date   [id number] 08/02/2021    Hyperlipidemia: Lipitor 10 mg 2 days per week. No new side effects or myalgias. Hx of thoracic aortic aneurysm.  Lab Results  Component Value Date   CHOL 183 08/02/2021   HDL 53.50 08/02/2021   LDLCALC 117 (H) 08/02/2021   TRIG 60.0 08/02/2021   CHOLHDL 3 08/02/2021   Lab Results  Component Value Date   ALT 14 08/02/2021   AST 19 08/02/2021   ALKPHOS 71 08/02/2021   BILITOT 0.8 08/02/2021   R ear crusting: Past month. Sore in area at times. No hearing changes. Dry crusty substance, no liquid d/c or bleeding.  Tx: none.    History Patient Active Problem List   Diagnosis Date Noted   Thoracic aortic aneurysm without rupture ([place]) 10/10/2021   Dermatochalasis 02/10/2021   Ectropion 02/10/2021   Claw toe, acquired, left    Bunion of great toe of left foot     Skin lesion of face 06/14/2020   Unilateral primary osteoarthritis, right knee 01/06/2020   S/P hernia repair 10/15/2019   Neuropathy 07/20/2019   Macrocytic anemia    Debility 07/07/2019   Atelectasis    Enterococcal bacteremia 06/26/2019   Perforated viscus 01/06/2019   Onychomycosis 05/19/2018   Foot fracture, left, closed, initial encounter 05/09/2018   Lisfranc dislocation, left, initial encounter    Primary pulmonary hypertension ([place]) [id number]   Diastolic dysfunction [id number]   OSA (obstructive sleep apnea) 07/08/2017   SOB (shortness of breath) 07/02/2017   Chronic respiratory failure with hypoxia and hypercapnia ([place]) 07/02/2017   Cardiomegaly 07/02/2017   Exposure keratitis 02/17/2017   Pedal edema 12/18/2016   Transaminitis 12/18/2016   Bilateral pes planus 12/18/2016   Psoriasis-eczema overlap condition 07/30/2016   High serum high density lipoprotein (HDL) 09/26/2015   Epistaxis 09/26/2015   Renal artery stenosis in 1 of 2 vessels (HCC)    Essential tremor    Elevated PSA    GERD (gastroesophageal reflux disease)    Health maintenance examination 10/08/2014   Anxiety state 10/08/2014   Obesity, Class I, BMI 30-34.9    Mild intermittent asthma in adult without complication    Seasonal allergies    HTN (hypertension)    Scoliosis    Osteoporosis    Past Medical History:  Diagnosis Date   Alcohol  use disorder, mild, abuse    Anemia    Anxiety    situational   Arthritis    Phreesia 02/21/2020   Asthma    Phreesia 02/21/2020   BPH (benign prostatic hyperplasia)    on flomax- Patien tand wife denies- and patient has never been on Flomax   Bundle branch block    per prior PCP records   CHF (congestive heart failure) ([place])    Chronic kidney disease    Acute Renal Failure- June 2021- admission- kidneys recovered quickly   Complication of anesthesia 03/11/2019   woke up before surgery was over- "paralytic"   COPD (chronic obstructive pulmonary  disease) ([place])    Diverticulosis    by colonoscopy   Eczema    per prior PCP records   Elevated PSA 2015   peaked 6s, s/p benign biopsy 2014, sees urology [name]   Essential tremor    GERD (gastroesophageal reflux disease)    per prior PCP records   History of panic attacks    per prior PCP records   HTN (hypertension)    Hypertension    Phreesia 02/21/2020   Mild intermittent asthma in adult without complication    Obesity, Class I, BMI 30-34.9    Osteoporosis    DEXA 06/2013 with osteopenia - h/o ?wrist/hip fracture from AVN from chronic steroid use (asthma), took reclast for 1 year   Pneumonia    10/08/19 >10 years ago   Seasonal allergies    Sleep apnea    Substance abuse ([place])    Phreesia 02/21/2020   Thoracic aortic aneurysm [facility])    Thoracic scoliosis childhood   Transaminitis    per prior PCP records   Vitamin B 12 deficiency    Past Surgical History:  Procedure Laterality Date   APPLICATION OF WOUND VAC Left 05/09/2018   Procedure: Application Of Wound Vac;  Surgeon: Newt Minion, MD;  Location: [place];  Service: Orthopedics;  Laterality: Left;   BIOPSY  04/30/2019   Procedure: BIOPSY;  Surgeon: Juanita Craver, MD;  Location: [facility];  Service: Endoscopy;;   BROW LIFT Bilateral 04/12/2021   Procedure: UPPER LID BLEPHAROPLASTY;  Surgeon: Wallace Going, DO;  Location: [place];  Service: Plastics;  Laterality: Bilateral;   COLONOSCOPY  12/2013   polyps, int hem, diverticulosis, rpt 5 yrs [name])   COLONOSCOPY WITH PROPOFOL N/A 04/30/2019   Procedure: COLONOSCOPY WITH PROPOFOL;  Surgeon: Juanita Craver, MD;  [facility]: [facility];  Service: Endoscopy;  Laterality: N/A;   ELBOW SURGERY Right 2008   golfer's elbow   ESOPHAGOGASTRODUODENOSCOPY (EGD) WITH PROPOFOL N/A 04/30/2019   Procedure: ESOPHAGOGASTRODUODENOSCOPY (EGD) WITH PROPOFOL;  Surgeon: Juanita Craver, MD;  [facility];  Service: Endoscopy;  Laterality: N/A;   HERNIA  REPAIR N/A    Phreesia 02/21/2020   INCISIONAL HERNIA REPAIR N/A 10/15/2019   Procedure: HERNIA REPAIR INCISIONAL WITH MESH, TAR;  Surgeon: Ralene Ok, MD;  Location: [place];  Service: General;  Laterality: N/A;   INGUINAL HERNIA REPAIR Bilateral childhood & ~1995   LAPAROTOMY N/A 01/05/2019   Procedure: EXPLORATORY LAPAROTOMY graham patch repair;  Surgeon: Benjamine Sprague, DO;  Location: [facility];  Service: General;  Laterality: N/A;   LAPAROTOMY N/A 10/15/2019   Procedure: EXPLORATORY LAPAROTOMY;  Surgeon: Ralene Ok, MD;  Location: [place];  Service: General;  Laterality: N/A;   LYSIS OF ADHESION N/A 10/15/2019   Procedure: LYSIS OF ADHESION;  Surgeon: Ralene Ok, MD;  Location: [place];  Service:  General;  Laterality: N/A;   NASAL SEPTUM SURGERY  2011/03/17   deviated septum   ORIF ANKLE FRACTURE Left 05/09/2018   Procedure: OPEN REDUCTION INTERNAL FIXATION LEFT LISFRANC FRACTURE/DISLOCATION;  Surgeon: Newt Minion, MD;  Location: [place];  Service: Orthopedics;  Laterality: Left;   POLYPECTOMY  04/30/2019   Procedure: POLYPECTOMY;  Surgeon: Juanita Craver, MD;  [facility];  Service: Endoscopy;;   TEE WITHOUT CARDIOVERSION N/A 06/29/2019   Procedure: TRANSESOPHAGEAL ECHOCARDIOGRAM (TEE);  Surgeon: Acie Fredrickson Wonda Cheng, MD;  Location[facility];  Service: Cardiovascular;  Laterality: N/A;   US ECHOCARDIOGRAPHY  02/2009   WNL, EF >55% [facility])   US RENAL/AORTA Right 05/2009   1-59% diameter reduction renal artery, rec rpt 2 yrs   VASECTOMY N/A    Phreesia 02/21/2020   WEIL OSTEOTOMY Left 11/16/2020   Procedure: WEIL OSTEOTOMY LEFT 2ND METATARSAL,  2ND TOE DISTAL INTERPHALANGEAL RESECTION AND [name] OSTEOTOMY WEIL OSTEOTOMY THIRD METATARSAL;  Surgeon: Newt Minion, MD;  Location: [place];  Service: Orthopedics;  Laterality: Left;   Allergies  Allergen Reactions   Accupril [Quinapril Hcl] Cough   Nsaids     Perforated gastric ulcer   Quinolones Other (See Comments)    Hx of  thoracic aortic aneurysm. Avoid quinolones.    Prior to Admission medications   Medication Sig Start Date End Date Taking? Authorizing Provider  acetaminophen (TYLENOL) 500 MG tablet Take 500 mg by mouth every 6 (six) hours as needed for mild pain.   Yes [provider]  amLODipine (NORVASC) 10 MG tablet Take one tablet daily 11/28/21  Yes Wendie Agreste, MD  atorvastatin (LIPITOR) 10 MG tablet Take 1 by mouth 2-3 days per week. 09/21/21  Yes Wendie Agreste, MD  diclofenac Sodium (VOLTAREN) 1 % GEL Apply 1 application topically 4 (four) times daily as needed (knee pain).   Yes [provider]  gabapentin (NEURONTIN) 300 MG capsule TAKE 1 CAPSULE(300 MG) BY MOUTH THREE TIMES DAILY 11/02/21  Yes Wendie Agreste, MD  losartan-hydrochlorothiazide (HYZAAR) 100-12.5 MG tablet Take 1 tablet by mouth daily. 08/02/21  Yes Wendie Agreste, MD  metoprolol succinate (TOPROL-XL) 50 MG 24 hr tablet Take with or immediately following a meal. 11/28/21  Yes Wendie Agreste, MD  Multiple Vitamin (MULTIVITAMIN WITH MINERALS) TABS tablet Take 1 tablet by mouth daily. 07/08/19  Yes Dahal, Marlowe Aschoff, MD  olopatadine (PATANOL) 0.1 % ophthalmic solution Place 1 drop into both eyes 2 (two) times daily as needed for allergies.   Yes [provider]  omeprazole (PRILOSEC) 20 MG capsule Take 20 mg by mouth daily.   Yes [provider]   Social History   Socioeconomic History   Marital status: Married    Spouse name: Not on file   Number of children: Not on file   Years of education: Not on file   Highest education level: Not on file  Occupational History   Not on file  Tobacco Use   Smoking status: Never   Smokeless tobacco: Never  Vaping Use   Vaping Use: Never used  Substance and Sexual Activity   Alcohol use: Not Currently   Drug use: No   Sexual activity: Yes    Birth control/protection: None  Other Topics Concern   Not on file  Social History Narrative   Lives  with wife (retired Materials engineer), 63yo dog died 17-Mar-2014   [place] children   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, was Chief Executive Officer   Edu: [employer], masters in [employer]  Activity: TRX and cardio and pilates   Diet: good water, fruits/vegetables daily   Social Determinants of Health   Financial Resource Strain: Not on file  Food Insecurity: Not on file  Transportation Needs: Not on file  Physical Activity: Not on file  Stress: Not on file  Social Connections: Not on file  Intimate Partner Violence: Not on file    Review of Systems Per HPI   Objective:   Vitals:   02/13/22 1012  BP: 126/72  Pulse: 78  Temp: 97.8 F (36.6 C)  TempSrc: Temporal  SpO2: 95%  Weight: 200 lb 12.8 oz (91.1 kg)  Height: 5' 11"$  (1.803 m)     Physical Exam Vitals reviewed.  Constitutional:      Appearance: He is well-developed.  HENT:     Head: Normocephalic and atraumatic.     Right Ear: Tympanic membrane and external ear normal.     Left Ear: Tympanic membrane and external ear normal.     Ears:     Comments: [place]innas nontender.  Small amt dry skin, crusting at end of canal on R, no erythema/edema.  Neck:     Vascular: No carotid bruit or JVD.  Cardiovascular:     Rate and Rhythm: Normal rate and regular rhythm.     Heart sounds: Normal heart sounds. No murmur heard. Pulmonary:     Effort: Pulmonary effort is normal.     Breath sounds: Normal breath sounds. No rales.  Musculoskeletal:     Right lower leg: No edema.     Left lower leg: No edema.  Skin:    General: Skin is warm and dry.  Neurological:     Mental Status: He is alert and oriented to person, place, and time.  Psychiatric:        Mood and Affect: Mood normal.        Assessment & Plan:  Berlyn Micke is a 67 y.o. male . Ear itching - Plan: neomycin-polymyxin-hydrocortisone (CORTISPORIN) OTIC solution  -Possible dry skin component, over-the-counter lotion to just inside external ear with option of short-term Cortisporin otic if needed  with RTC precautions.  Neuropathy - Plan: gabapentin (NEURONTIN) 300 MG capsule B12 deficiency - Plan: gabapentin (NEURONTIN) 300 MG capsule  -Stable, continue same dose gabapentin.  Essential hypertension - Plan: amLODipine (NORVASC) 10 MG tablet, losartan-hydrochlorothiazide (HYZAAR) 100-12.5 MG tablet, metoprolol succinate (TOPROL-XL) 50 MG 24 hr tablet  -Stable with current regimen, asymptomatic at lower heart rates.  No changes.  Hyperlipidemia, unspecified hyperlipidemia type - Plan: atorvastatin (LIPITOR) 10 MG tablet, Comprehensive metabolic panel, Lipid panel  -Tolerating Lipitor, continue same dose with labs ordered.  Adjust plan accordingly.  Right ear pain - Plan: neomycin-polymyxin-hydrocortisone (CORTISPORIN) OTIC solution  -As above.  RTC precautions.  Meds ordered this encounter  Medications   gabapentin (NEURONTIN) 300 MG capsule    Sig: TAKE 1 CAPSULE(300 MG) BY MOUTH THREE TIMES DAILY    Dispense:  270 capsule    Refill:  1   amLODipine (NORVASC) 10 MG tablet    Sig: Take one tablet daily    Dispense:  90 tablet    Refill:  2   losartan-hydrochlorothiazide (HYZAAR) 100-12.5 MG tablet    Sig: Take 1 tablet by mouth daily.    Dispense:  90 tablet    Refill:  2   atorvastatin (LIPITOR) 10 MG tablet    Sig: Take 1 by mouth 2-3 days per week.    Dispense:  30 tablet    Refill:  1   neomycin-polymyxin-hydrocortisone (CORTISPORIN) OTIC solution    Sig: Place 3 drops into the right ear 3 (three) times daily. For 3-5 days if needed for soreness, itching.    Dispense:  10 mL    Refill:  0   metoprolol succinate (TOPROL-XL) 50 MG 24 hr tablet    Sig: Take with or immediately following a meal.    Dispense:  90 tablet    Refill:  2   Patient Instructions  Blood pressure looks good today.  I will let you know if any concerns on labs, no med changes for now.  Try a small amount of lotion on the tip of the finger just inside the ear canal and on the outside of the ear  if any itching.  I also wrote for some drops that have some steroid to help with itching or irritation.  Can use this for 3 to 5 days but follow-up if soreness does not improve in that time or any worsening symptoms.  Take care!    Signed,   Merri Ray, MD [place] 02/13/22 10:56 AM

## 2022-02-13 NOTE — Patient Instructions (Addendum)
Blood pressure looks good today.  I will let you know if any concerns on labs, no med changes for now.  Try a small amount of lotion on the tip of the finger just inside the ear canal and on the outside of the ear if any itching.  I also wrote for some drops that have some steroid to help with itching or irritation.  Can use this for 3 to 5 days but follow-up if soreness does not improve in that time or any worsening symptoms.  Take care!

## 2022-02-14 ENCOUNTER — Ambulatory Visit: Payer: BC Managed Care – PPO | Admitting: Family Medicine

## 2022-02-14 DIAGNOSIS — F1020 Alcohol dependence, uncomplicated: Secondary | ICD-10-CM | POA: Diagnosis not present

## 2022-02-14 DIAGNOSIS — F329 Major depressive disorder, single episode, unspecified: Secondary | ICD-10-CM | POA: Diagnosis not present

## 2022-02-23 ENCOUNTER — Telehealth (INDEPENDENT_AMBULATORY_CARE_PROVIDER_SITE_OTHER): Payer: BC Managed Care – PPO | Admitting: Family Medicine

## 2022-02-23 ENCOUNTER — Telehealth: Payer: Self-pay | Admitting: Family Medicine

## 2022-02-23 ENCOUNTER — Encounter: Payer: Self-pay | Admitting: Family Medicine

## 2022-02-23 VITALS — Ht 71.0 in | Wt 195.0 lb

## 2022-02-23 DIAGNOSIS — J069 Acute upper respiratory infection, unspecified: Secondary | ICD-10-CM

## 2022-02-23 MED ORDER — ALBUTEROL SULFATE HFA 108 (90 BASE) MCG/ACT IN AERS: 2.0000 | INHALATION_SPRAY | Freq: Four times a day (QID) | RESPIRATORY_TRACT | 0 refills | Status: AC | PRN

## 2022-02-23 NOTE — Progress Notes (Signed)
Virtual Visit via Video   I connected with patient on 02/23/22 at  1:00 PM EST by a video enabled telemedicine application and verified that I am speaking with the correct person using two identifiers.  Location patient: Home Location provider: [facility], Office Persons participating in the virtual visit: Patient, Provider, [place] Marcille Blanco C)  I discussed the limitations of evaluation and management by telemedicine and the availability of in person appointments. The patient expressed understanding and agreed to proceed.  Subjective:   HPI:   Congestion- pt reports 'progressive' congestion w/ productive cough.  Cough is productive of yellow green sputum.  No fevers, body aches, HA's, denies facial pain/pressure.  Runny nose is improving but cough is worsening and now has audible wheezing.  Just started Flonase, Claritin.  Some relief w/ Tessalon.  Pt has hx of asthma.  ROS:   See pertinent positives and negatives per HPI.  Patient Active Problem List   Diagnosis Date Noted   Thoracic aortic aneurysm without rupture ([place]) 10/10/2021   Dermatochalasis 02/10/2021   Ectropion 02/10/2021   Claw toe, acquired, left    Bunion of great toe of left foot    Skin lesion of face 06/14/2020   Unilateral primary osteoarthritis, right knee 01/06/2020   S/P hernia repair 10/15/2019   Neuropathy 07/20/2019   Macrocytic anemia    Debility 07/07/2019   Atelectasis    Enterococcal bacteremia 06/26/2019   Perforated viscus 01/06/2019   Onychomycosis 05/19/2018   Foot fracture, left, closed, initial encounter 05/09/2018   Lisfranc dislocation, left, initial encounter    Primary pulmonary hypertension ([place]) A999333   Diastolic dysfunction Q000111Q   OSA (obstructive sleep apnea) 07/08/2017   SOB (shortness of breath) 07/02/2017   Chronic respiratory failure with hypoxia and hypercapnia ([place]) 07/02/2017   Cardiomegaly 07/02/2017   Exposure keratitis 02/17/2017   Pedal edema  12/18/2016   Transaminitis 12/18/2016   Bilateral pes planus 12/18/2016   Psoriasis-eczema overlap condition 07/30/2016   High serum high density lipoprotein (HDL) 09/26/2015   Epistaxis 09/26/2015   Renal artery stenosis in 1 of 2 vessels (HCC)    Essential tremor    Elevated PSA    Obesity, Class I, BMI 30-34.9    Mild intermittent asthma in adult without complication    Seasonal allergies    HTN (hypertension)    Scoliosis     Social History   Tobacco Use   Smoking status: Never   Smokeless tobacco: Never  Substance Use Topics   Alcohol use: Not Currently    Current Outpatient Medications:    acetaminophen (TYLENOL) 500 MG tablet, Take 500 mg by mouth every 6 (six) hours as needed for mild pain., Disp: , Rfl:    amLODipine (NORVASC) 10 MG tablet, Take one tablet daily, Disp: 90 tablet, Rfl: 2   atorvastatin (LIPITOR) 10 MG tablet, Take 1 by mouth 2-3 days per week., Disp: 30 tablet, Rfl: 1   benzonatate (TESSALON) 100 MG capsule, Take by mouth 3 (three) times daily as needed for cough., Disp: , Rfl:    diclofenac Sodium (VOLTAREN) 1 % GEL, Apply 1 application topically 4 (four) times daily as needed (knee pain)., Disp: , Rfl:    fluticasone (FLONASE) 50 MCG/ACT nasal spray, Place into both nostrils daily., Disp: , Rfl:    gabapentin (NEURONTIN) 300 MG capsule, TAKE 1 CAPSULE(300 MG) BY MOUTH THREE TIMES DAILY, Disp: 270 capsule, Rfl: 1   losartan-hydrochlorothiazide (HYZAAR) 100-12.5 MG tablet, Take 1 tablet by mouth daily., Disp: 90  tablet, Rfl: 2   metoprolol succinate (TOPROL-XL) 50 MG 24 hr tablet, Take with or immediately following a meal., Disp: 90 tablet, Rfl: 2   Multiple Vitamin (MULTIVITAMIN WITH MINERALS) TABS tablet, Take 1 tablet by mouth daily., Disp: , Rfl:    neomycin-polymyxin-hydrocortisone (CORTISPORIN) OTIC solution, Place 3 drops into the right ear 3 (three) times daily. For 3-5 days if needed for soreness, itching., Disp: 10 mL, Rfl: 0   olopatadine  (PATANOL) 0.1 % ophthalmic solution, Place 1 drop into both eyes 2 (two) times daily as needed for allergies., Disp: , Rfl:    omeprazole (PRILOSEC) 20 MG capsule, Take 20 mg by mouth daily., Disp: , Rfl:   Allergies  Allergen Reactions   Accupril [Quinapril Hcl] Cough   Nsaids     Perforated gastric ulcer   Quinolones Other (See Comments)    Hx of thoracic aortic aneurysm. Avoid quinolones.     Objective:   Ht '5\' 11"'$  (1.803 m)   Wt 195 lb (88.5 kg)   BMI 27.20 kg/m  AAOx3, NAD NCAT, EOMI No obvious CN deficits Coloring WNL Pt is able to speak clearly, coherently without shortness of breath or increased work of breathing.  Thought process is linear.  Mood is appropriate.   Assessment and Plan:   Viral URI- new.  Pt reports things seem to be better today than the last 2 days which is a good sign.  No fever or localized sxs that would suggest bacterial infxn.  Start Albuterol for cough and wheezing.  He can continue to take the Tessalon and should add Mucinex DM.  Reviewed supportive care and red flags that should prompt return.  Pt expressed understanding and is in agreement w/ plan.    Annye Asa, MD 02/23/2022

## 2022-02-23 NOTE — Telephone Encounter (Signed)
Made appointment today with Dr. Birdie Riddle.

## 2022-02-23 NOTE — Telephone Encounter (Signed)
Caller name: Bright Kille  On DPR?: Yes  Call back number: [phone] (mobile)  Provider they see: Wendie Agreste, MD  Reason for call:Coughing wheezing runny nose - he wants to know what can Dr Lawernce Ion do -advise

## 2022-03-05 ENCOUNTER — Encounter: Payer: Self-pay | Admitting: Family Medicine

## 2022-03-05 DIAGNOSIS — J9601 Acute respiratory failure with hypoxia: Secondary | ICD-10-CM | POA: Diagnosis not present

## 2022-03-06 NOTE — Telephone Encounter (Signed)
Pt asking if he needs to update booster for COVID 19 last on in September 2023 pt is traveling in may   Please advise.

## 2022-03-14 ENCOUNTER — Ambulatory Visit: Payer: BC Managed Care – PPO | Admitting: Orthopaedic Surgery

## 2022-03-14 DIAGNOSIS — F419 Anxiety disorder, unspecified: Secondary | ICD-10-CM | POA: Diagnosis not present

## 2022-03-14 DIAGNOSIS — M25551 Pain in right hip: Secondary | ICD-10-CM

## 2022-03-14 DIAGNOSIS — F329 Major depressive disorder, single episode, unspecified: Secondary | ICD-10-CM | POA: Diagnosis not present

## 2022-03-14 NOTE — Progress Notes (Signed)
Office Visit Note   Patient: Louis [name]           Date of Birth: Dec 06, 1955           MRN: [id number] Visit Date: 03/14/2022              Requested by: Louis Agreste, MD [address] [place],  [place] [address] PCP: Louis Agreste, MD   Assessment & Plan: Visit Diagnoses:  1. Pain of right hip     Plan: Impression is 67 year old gentleman with stable posterior lateral right hip pain.  Option of doing a steroid injection was discussed today but he would like to wait until it is closer to his bike trip in May.  He will talk to a physical therapist about modalities to try.  For now he will continue with the same regimen activity modification and home stretches and Voltaren gel.  Follow-Up Instructions: No follow-ups on file.   Orders:  No orders of the defined types were placed in this encounter.  No orders of the defined types were placed in this encounter.     Procedures: No procedures performed   Clinical Data: No additional findings.   Subjective: Chief Complaint  Patient presents with   Right Hip - Pain    HPI  Louis [name] returns today for stable right hip pain.  Review of Systems   Objective: Vital Signs: There were no vitals taken for this visit.  Physical Exam  Ortho Exam  Examination right hip is unchanged.  Specialty Comments:  No specialty comments available.  Imaging: No results found.   PMFS History: Patient Active Problem List   Diagnosis Date Noted   Thoracic aortic aneurysm without rupture ([place]) 10/10/2021   Dermatochalasis 02/10/2021   Ectropion 02/10/2021   Claw toe, acquired, left    Bunion of great toe of left foot    Skin lesion of face 06/14/2020   Unilateral primary osteoarthritis, right knee 01/06/2020   S/P hernia repair 10/15/2019   Neuropathy 07/20/2019   Macrocytic anemia    Debility 07/07/2019   Atelectasis    Enterococcal bacteremia 06/26/2019   Perforated viscus 01/06/2019   Onychomycosis  05/19/2018   Foot fracture, left, closed, initial encounter 05/09/2018   Lisfranc dislocation, left, initial encounter    Primary pulmonary hypertension ([place]) A999333   Diastolic dysfunction Q000111Q   OSA (obstructive sleep apnea) 07/08/2017   SOB (shortness of breath) 07/02/2017   Chronic respiratory failure with hypoxia and hypercapnia ([place]) 07/02/2017   Cardiomegaly 07/02/2017   Exposure keratitis 02/17/2017   Pedal edema 12/18/2016   Transaminitis 12/18/2016   Bilateral pes planus 12/18/2016   Psoriasis-eczema overlap condition 07/30/2016   High serum high density lipoprotein (HDL) 09/26/2015   Epistaxis 09/26/2015   Renal artery stenosis in 1 of 2 vessels (HCC)    Essential tremor    Elevated PSA    Obesity, Class I, BMI 30-34.9    Mild intermittent asthma in adult without complication    Seasonal allergies    HTN (hypertension)    Scoliosis    Past Medical History:  Diagnosis Date   Alcohol use disorder, mild, abuse    Anemia    Anxiety    situational   Arthritis    Phreesia 02/21/2020   Asthma    Phreesia 02/21/2020   BPH (benign prostatic hyperplasia)    on flomax- Patien tand wife denies- and patient has never been on Flomax   Bundle branch block  per prior PCP records   CHF (congestive heart failure) ([place])    Chronic kidney disease    Acute Renal Failure- June 2021- admission- kidneys recovered quickly   Complication of anesthesia 03/11/2019   woke up before surgery was over- "paralytic"   COPD (chronic obstructive pulmonary disease) ([place])    Diverticulosis    by colonoscopy   Eczema    per prior PCP records   Elevated PSA 2015   peaked 6s, s/p benign biopsy 2014, sees urology [name] [facility])   Essential tremor    GERD (gastroesophageal reflux disease)    per prior PCP records   History of panic attacks    per prior PCP records   HTN (hypertension)    Hypertension    Phreesia 02/21/2020   Mild intermittent asthma in adult without  complication    Obesity, Class I, BMI 30-34.9    Osteoporosis    DEXA 06/2013 with osteopenia - h/o ?wrist/hip fracture from AVN from chronic steroid use (asthma), took reclast for 1 year   Pneumonia    10/08/19 >10 years ago   Seasonal allergies    Sleep apnea    Substance abuse ([place])    Phreesia 02/21/2020   Thoracic aortic aneurysm [facility])    Thoracic scoliosis childhood   Transaminitis    per prior PCP records   Vitamin B 12 deficiency     Family History  Problem Relation Age of Onset   Cancer Father 47       colon   Prostate cancer Father    Diabetes Mother    Alcohol abuse Mother    Stroke Mother    Cancer Maternal Grandmother        pancreatic   Ataxia Brother    Ataxia Sister    CAD Neg Hx     Past Surgical History:  Procedure Laterality Date   APPLICATION OF WOUND VAC Left 05/09/2018   Procedure: Application Of Wound Vac;  Surgeon: Newt Minion, MD;  Location: [place];  Service: Orthopedics;  Laterality: Left;   BIOPSY  04/30/2019   Procedure: BIOPSY;  Surgeon: Juanita Craver, MD;  [facility];  Service: Endoscopy;;   BROW LIFT Bilateral 04/12/2021   Procedure: UPPER LID BLEPHAROPLASTY;  Surgeon: Wallace Going, DO;  Location: [place];  Service: Plastics;  Laterality: Bilateral;   COLONOSCOPY  12/2013   polyps, int hem, diverticulosis, rpt 5 yrs [name])   COLONOSCOPY WITH PROPOFOL N/A 04/30/2019   Procedure: COLONOSCOPY WITH PROPOFOL;  Surgeon: Juanita Craver, MD;  [facility];  Service: Endoscopy;  Laterality: N/A;   ELBOW SURGERY Right 2008   golfer's elbow   ESOPHAGOGASTRODUODENOSCOPY (EGD) WITH PROPOFOL N/A 04/30/2019   Procedure: ESOPHAGOGASTRODUODENOSCOPY (EGD) WITH PROPOFOL;  Surgeon: Juanita Craver, MD;  [facility];  Service: Endoscopy;  Laterality: N/A;   HERNIA REPAIR N/A    Phreesia 02/21/2020   INCISIONAL HERNIA REPAIR N/A 10/15/2019   Procedure: HERNIA REPAIR INCISIONAL WITH MESH, TAR;  Surgeon: Ralene Ok, MD;  Location: [place];  Service: General;  Laterality: N/A;   INGUINAL HERNIA REPAIR Bilateral childhood & ~1995   LAPAROTOMY N/A 01/05/2019   Procedure: EXPLORATORY LAPAROTOMY graham patch repair;  Surgeon: Benjamine Sprague, DO;  Location: [facility];  Service: General;  Laterality: N/A;   LAPAROTOMY N/A 10/15/2019   Procedure: EXPLORATORY LAPAROTOMY;  Surgeon: Ralene Ok, MD;  Location: [place];  Service: General;  Laterality: N/A;   LYSIS OF ADHESION N/A 10/15/2019   Procedure: LYSIS  OF ADHESION;  Surgeon: Ralene Ok, MD;  Location: [place];  Service: General;  Laterality: N/A;   NASAL SEPTUM SURGERY  2013   deviated septum   ORIF ANKLE FRACTURE Left 05/09/2018   Procedure: OPEN REDUCTION INTERNAL FIXATION LEFT LISFRANC FRACTURE/DISLOCATION;  Surgeon: Newt Minion, MD;  Location: [place];  Service: Orthopedics;  Laterality: Left;   POLYPECTOMY  04/30/2019   Procedure: POLYPECTOMY;  Surgeon: Juanita Craver, MD;  [facility];  Service: Endoscopy;;   TEE WITHOUT CARDIOVERSION N/A 06/29/2019   Procedure: TRANSESOPHAGEAL ECHOCARDIOGRAM (TEE);  Surgeon: Acie Fredrickson Wonda Cheng, MD;  [facility];  Service: Cardiovascular;  Laterality: N/A;   US ECHOCARDIOGRAPHY  02/2009   WNL, EF >55% Claib[facility] Billings)   US RENAL/AORTA Right 05/2009   1-59% diameter reduction renal artery, rec rpt 2 yrs   VASECTOMY N/A    Phreesia 02/21/2020   WEIL OSTEOTOMY Left 11/16/2020   Procedure: WEIL OSTEOTOMY LEFT 2ND METATARSAL,  2ND TOE DISTAL INTERPHALANGEAL RESECTION AND [name] OSTEOTOMY WEIL OSTEOTOMY THIRD METATARSAL;  Surgeon: Newt Minion, MD;  Location: [place];  Service: Orthopedics;  Laterality: Left;   Social History   Occupational History   Not on file  Tobacco Use   Smoking status: Never   Smokeless tobacco: Never  Vaping Use   Vaping Use: Never used  Substance and Sexual Activity   Alcohol use: Not Currently   Drug use: No   Sexual activity: Yes    Birth control/protection: None

## 2022-03-15 DIAGNOSIS — M546 Pain in thoracic spine: Secondary | ICD-10-CM | POA: Diagnosis not present

## 2022-04-04 DIAGNOSIS — L814 Other melanin hyperpigmentation: Secondary | ICD-10-CM | POA: Diagnosis not present

## 2022-04-04 DIAGNOSIS — D294 Benign neoplasm of scrotum: Secondary | ICD-10-CM | POA: Diagnosis not present

## 2022-04-04 DIAGNOSIS — L720 Epidermal cyst: Secondary | ICD-10-CM | POA: Diagnosis not present

## 2022-04-04 DIAGNOSIS — D225 Melanocytic nevi of trunk: Secondary | ICD-10-CM | POA: Diagnosis not present

## 2022-04-05 DIAGNOSIS — J9601 Acute respiratory failure with hypoxia: Secondary | ICD-10-CM | POA: Diagnosis not present

## 2022-04-06 DIAGNOSIS — M546 Pain in thoracic spine: Secondary | ICD-10-CM | POA: Diagnosis not present

## 2022-04-19 DIAGNOSIS — M546 Pain in thoracic spine: Secondary | ICD-10-CM | POA: Diagnosis not present

## 2022-04-20 DIAGNOSIS — F329 Major depressive disorder, single episode, unspecified: Secondary | ICD-10-CM | POA: Diagnosis not present

## 2022-04-22 NOTE — Progress Notes (Unsigned)
Office Visit Note   Patient: Louis [name]           Date of Birth: 1955/04/19           MRN: [id number] Visit Date: 04/24/2022              Requested by: Shade Flood, MD [address] [place],  [place] [address] PCP: Shade Flood, MD   Assessment & Plan: Visit Diagnoses:  1. Pain of right hip     Plan: Impression is right lower back pain.  I think it is probably better idea to try a steroid then to do an injection since I think it is coming from his back.  He will start this now and hopefully he will be feeling much better before his upcoming bike trip.  He will let us know if his symptoms  Follow-Up Instructions: No follow-ups on file.   Orders:  No orders of the defined types were placed in this encounter.  Meds ordered this encounter  Medications   predniSONE (STERAPRED UNI-PAK 21 TAB) 10 MG (21) TBPK tablet    Sig: Take as directed    Dispense:  21 tablet    Refill:  3      Procedures: No procedures performed   Clinical Data: No additional findings.   Subjective: Chief Complaint  Patient presents with   Right Hip - Pain    HPI  Louis [name] returns today for follow-up on right hip pain.  His pain seems to have migrated to the right side of the lumbar spine.  Denies any lateral hip pain.  Review of Systems   Objective: Vital Signs: There were no vitals taken for this visit.  Physical Exam  Ortho Exam  Examination right hip is unremarkable.  He localizes the pain to the upper buttock and right side of the lower back.  Specialty Comments:  No specialty comments available.  Imaging: No results found.   PMFS History: Patient Active Problem List   Diagnosis Date Noted   Thoracic aortic aneurysm without rupture 10/10/2021   Dermatochalasis 02/10/2021   Ectropion 02/10/2021   Claw toe, acquired, left    Bunion of great toe of left foot    Skin lesion of face 06/14/2020   Unilateral primary osteoarthritis, right knee 01/06/2020    S/P hernia repair 10/15/2019   Neuropathy 07/20/2019   Macrocytic anemia    Debility 07/07/2019   Atelectasis    Enterococcal bacteremia 06/26/2019   Perforated viscus 01/06/2019   Onychomycosis 05/19/2018   Foot fracture, left, closed, initial encounter 05/09/2018   Lisfranc dislocation, left, initial encounter    Primary pulmonary hypertension 07/09/2017   Diastolic dysfunction 07/08/2017   OSA (obstructive sleep apnea) 07/08/2017   SOB (shortness of breath) 07/02/2017   Chronic respiratory failure with hypoxia and hypercapnia 07/02/2017   Cardiomegaly 07/02/2017   Exposure keratitis 02/17/2017   Pedal edema 12/18/2016   Transaminitis 12/18/2016   Bilateral pes planus 12/18/2016   Psoriasis-eczema overlap condition 07/30/2016   High serum high density lipoprotein (HDL) 09/26/2015   Epistaxis 09/26/2015   Renal artery stenosis in 1 of 2 vessels    Essential tremor    Elevated PSA    Obesity, Class I, BMI 30-34.9    Mild intermittent asthma in adult without complication    Seasonal allergies    HTN (hypertension)    Scoliosis    Past Medical History:  Diagnosis Date   Alcohol use disorder, mild, abuse  Anemia    Anxiety    situational   Arthritis    Phreesia 02/21/2020   Asthma    Phreesia 02/21/2020   BPH (benign prostatic hyperplasia)    on flomax- Patien tand wife denies- and patient has never been on Flomax   Bundle branch block    per prior PCP records   CHF (congestive heart failure)    Chronic kidney disease    Acute Renal Failure- June 2021- admission- kidneys recovered quickly   Complication of anesthesia 03/11/2019   woke up before surgery was over- "paralytic"   COPD (chronic obstructive pulmonary disease)    Diverticulosis    by colonoscopy   Eczema    per prior PCP records   Elevated PSA 2015   peaked 6s, s/p benign biopsy 2014, sees urology Hal Morales ([place])   Essential tremor    GERD (gastroesophageal reflux disease)    per prior PCP  records   History of panic attacks    per prior PCP records   HTN (hypertension)    Hypertension    Phreesia 02/21/2020   Mild intermittent asthma in adult without complication    Obesity, Class I, BMI 30-34.9    Osteoporosis    DEXA 06/2013 with osteopenia - h/o ?wrist/hip fracture from AVN from chronic steroid use (asthma), took reclast for 1 year   Pneumonia    10/08/19 >10 years ago   Seasonal allergies    Sleep apnea    Substance abuse    Phreesia 02/21/2020   Thoracic aortic aneurysm    Thoracic scoliosis childhood   Transaminitis    per prior PCP records   Vitamin B 12 deficiency     Family History  Problem Relation Age of Onset   Cancer Father 73       colon   Prostate cancer Father    Diabetes Mother    Alcohol abuse Mother    Stroke Mother    Cancer Maternal Grandmother        pancreatic   Ataxia Brother    Ataxia Sister    CAD Neg Hx     Past Surgical History:  Procedure Laterality Date   APPLICATION OF WOUND VAC Left 05/09/2018   Procedure: Application Of Wound Vac;  Surgeon: Nadara Mustard, MD;  [facility];  Service: Orthopedics;  Laterality: Left;   BIOPSY  04/30/2019   Procedure: BIOPSY;  Surgeon: Charna Elizabeth, MD;  [facility];  Service: Endoscopy;;   BROW LIFT Bilateral 04/12/2021   Procedure: UPPER LID BLEPHAROPLASTY;  Surgeon: Peggye Form, DO;  Location: [facility];  Service: Plastics;  Laterality: Bilateral;   COLONOSCOPY  12/2013   polyps, int hem, diverticulosis, rpt 5 yrs [address])   COLONOSCOPY WITH PROPOFOL N/A 04/30/2019   Procedure: COLONOSCOPY WITH PROPOFOL;  Surgeon: Charna Elizabeth, MD;  [facility];  Service: Endoscopy;  Laterality: N/A;   ELBOW SURGERY Right 2008   golfer's elbow   ESOPHAGOGASTRODUODENOSCOPY (EGD) WITH PROPOFOL N/A 04/30/2019   Procedure: ESOPHAGOGASTRODUODENOSCOPY (EGD) WITH PROPOFOL;  Surgeon: Charna Elizabeth, MD;  [facility];  Service: Endoscopy;  Laterality: N/A;    HERNIA REPAIR N/A    Phreesia 02/21/2020   INCISIONAL HERNIA REPAIR N/A 10/15/2019   Procedure: HERNIA REPAIR INCISIONAL WITH MESH, TAR;  Surgeon: Axel Filler, MD;  Location: [facility] OR;  Service: General;  Laterality: N/A;   INGUINAL HERNIA REPAIR Bilateral childhood & ~1995   LAPAROTOMY N/A 01/05/2019   Procedure: EXPLORATORY LAPAROTOMY graham patch repair;  Surgeon: Sung Amabile, DO;  Location: [facility];  Service: General;  Laterality: N/A;   LAPAROTOMY N/A 10/15/2019   Procedure: EXPLORATORY LAPAROTOMY;  Surgeon: Axel Filler, MD;  Location: [facility];  Service: General;  Laterality: N/A;   LYSIS OF ADHESION N/A 10/15/2019   Procedure: LYSIS OF ADHESION;  Surgeon: Axel Filler, MD;  Location: [facility] OR;  Service: General;  Laterality: N/A;   NASAL SEPTUM SURGERY  2013   deviated septum   ORIF ANKLE FRACTURE Left 05/09/2018   Procedure: OPEN REDUCTION INTERNAL FIXATION LEFT LISFRANC FRACTURE/DISLOCATION;  Surgeon: Nadara Mustard, MD;  Location[facility];  Service: Orthopedics;  Laterality: Left;   POLYPECTOMY  04/30/2019   Procedure: POLYPECTOMY;  Surgeon: Charna Elizabeth, MD;  [facility];  Service: Endoscopy;;   TEE WITHOUT CARDIOVERSION N/A 06/29/2019   Procedure: TRANSESOPHAGEAL ECHOCARDIOGRAM (TEE);  Surgeon: Elease Hashimoto Deloris Ping, MD;  Location: [facility] ENDOSCOPY;  Service: Cardiovascular;  Laterality: N/A;   US ECHOCARDIOGRAPHY  02/2009   WNL, EF >55% Tresa Endo)   US RENAL/AORTA Right 05/2009   1-59% diameter reduction renal artery, rec rpt 2 yrs   VASECTOMY N/A    Phreesia 02/21/2020   WEIL OSTEOTOMY Left 11/16/2020   Procedure: WEIL OSTEOTOMY LEFT 2ND METATARSAL,  2ND TOE DISTAL INTERPHALANGEAL RESECTION AND Quintella Reichert OSTEOTOMY WEIL OSTEOTOMY THIRD METATARSAL;  Surgeon: Nadara Mustard, MD;  Location: [facility];  Service: Orthopedics;  Laterality: Left;   Social History   Occupational History   Not on file  Tobacco Use   Smoking status: Never   Smokeless tobacco: Never  Vaping Use   Vaping  Use: Never used  Substance and Sexual Activity   Alcohol use: Not Currently   Drug use: No   Sexual activity: Yes    Birth control/protection: None

## 2022-04-24 ENCOUNTER — Ambulatory Visit: Payer: BC Managed Care – PPO | Admitting: Orthopaedic Surgery

## 2022-04-24 ENCOUNTER — Encounter: Payer: Self-pay | Admitting: Orthopaedic Surgery

## 2022-04-24 DIAGNOSIS — M25551 Pain in right hip: Secondary | ICD-10-CM

## 2022-04-24 MED ORDER — PREDNISONE 10 MG (21) PO TBPK: ORAL_TABLET | ORAL | 3 refills | Status: DC

## 2022-04-30 DIAGNOSIS — M546 Pain in thoracic spine: Secondary | ICD-10-CM | POA: Diagnosis not present

## 2022-05-07 ENCOUNTER — Ambulatory Visit: Payer: BC Managed Care – PPO | Admitting: Cardiovascular Disease

## 2022-05-08 ENCOUNTER — Ambulatory Visit: Payer: BC Managed Care – PPO | Admitting: Orthopaedic Surgery

## 2022-05-08 DIAGNOSIS — M5416 Radiculopathy, lumbar region: Secondary | ICD-10-CM | POA: Diagnosis not present

## 2022-05-08 NOTE — Progress Notes (Signed)
Office Visit Note   Patient: Louis [name]           Date of Birth: 27-Oct-1955           MRN: [id number] Visit Date: 05/08/2022              Requested by: Shade Flood, MD [address] [place],  [place] [address] PCP: Shade Flood, MD   Assessment & Plan: Visit Diagnoses:  1. Lumbar radiculopathy     Plan: Impression is chronic lumbar radiculopathy.  At this point we will order an MRI with the intention of referring him to Dr. Alvester Morin for epidural steroid injections.  In the meantime he will take another round of prednisone Dosepak.  Follow-Up Instructions: No follow-ups on file.   Orders:  No orders of the defined types were placed in this encounter.  No orders of the defined types were placed in this encounter.     Procedures: No procedures performed   Clinical Data: No additional findings.   Subjective: Chief Complaint  Patient presents with   Right Hip - Follow-up, Pain    HPI Dreven returns today for ongoing right lower back pain.  Steroids have helped slightly.  Occasional radicular pain down the right leg. Review of Systems   Objective: Vital Signs: There were no vitals taken for this visit.  Physical Exam  Ortho Exam Examination of the lumbar spine shows pain in the right lumbar region.  Right hip exam is unremarkable. Specialty Comments:  No specialty comments available.  Imaging: No results found.   PMFS History: Patient Active Problem List   Diagnosis Date Noted   Thoracic aortic aneurysm without rupture (HCC) 10/10/2021   Dermatochalasis 02/10/2021   Ectropion 02/10/2021   Claw toe, acquired, left    Bunion of great toe of left foot    Skin lesion of face 06/14/2020   Unilateral primary osteoarthritis, right knee 01/06/2020   S/P hernia repair 10/15/2019   Neuropathy 07/20/2019   Macrocytic anemia    Debility 07/07/2019   Atelectasis    Enterococcal bacteremia 06/26/2019   Perforated viscus 01/06/2019    Onychomycosis 05/19/2018   Foot fracture, left, closed, initial encounter 05/09/2018   Lisfranc dislocation, left, initial encounter    Primary pulmonary hypertension (HCC) 07/09/2017   Diastolic dysfunction 07/08/2017   OSA (obstructive sleep apnea) 07/08/2017   SOB (shortness of breath) 07/02/2017   Chronic respiratory failure with hypoxia and hypercapnia (HCC) 07/02/2017   Cardiomegaly 07/02/2017   Exposure keratitis 02/17/2017   Pedal edema 12/18/2016   Transaminitis 12/18/2016   Bilateral pes planus 12/18/2016   Psoriasis-eczema overlap condition 07/30/2016   High serum high density lipoprotein (HDL) 09/26/2015   Epistaxis 09/26/2015   Renal artery stenosis in 1 of 2 vessels (HCC)    Essential tremor    Elevated PSA    Obesity, Class I, BMI 30-34.9    Mild intermittent asthma in adult without complication    Seasonal allergies    HTN (hypertension)    Scoliosis    Past Medical History:  Diagnosis Date   Alcohol use disorder, mild, abuse    Anemia    Anxiety    situational   Arthritis    Phreesia 02/21/2020   Asthma    Phreesia 02/21/2020   BPH (benign prostatic hyperplasia)    on flomax- Patien tand wife denies- and patient has never been on Flomax   Bundle branch block    per prior PCP records  CHF (congestive heart failure) (HCC)    Chronic kidney disease    Acute Renal Failure- June 2021- admission- kidneys recovered quickly   Complication of anesthesia 03/11/2019   woke up before surgery was over- "paralytic"   COPD (chronic obstructive pulmonary disease) (HCC)    Diverticulosis    by colonoscopy   Eczema    per prior PCP records   Elevated PSA 2015   peaked 6s, s/p benign biopsy 2014, sees urology Hal Morales [facility])   Essential tremor    GERD (gastroesophageal reflux disease)    per prior PCP records   History of panic attacks    per prior PCP records   HTN (hypertension)    Hypertension    Phreesia 02/21/2020   Mild intermittent asthma in adult  without complication    Obesity, Class I, BMI 30-34.9    Osteoporosis    DEXA 06/2013 with osteopenia - h/o ?wrist/hip fracture from AVN from chronic steroid use (asthma), took reclast for 1 year   Pneumonia    10/08/19 >10 years ago   Seasonal allergies    Sleep apnea    Substance abuse (HCC)    Phreesia 02/21/2020   Thoracic aortic aneurysm [facility])    Thoracic scoliosis childhood   Transaminitis    per prior PCP records   Vitamin B 12 deficiency     Family History  Problem Relation Age of Onset   Cancer Father 50       colon   Prostate cancer Father    Diabetes Mother    Alcohol abuse Mother    Stroke Mother    Cancer Maternal Grandmother        pancreatic   Ataxia Brother    Ataxia Sister    CAD Neg Hx     Past Surgical History:  Procedure Laterality Date   APPLICATION OF WOUND VAC Left 05/09/2018   Procedure: Application Of Wound Vac;  Surgeon: Nadara Mustard, MD;  Location: [facility] OR;  Service: Orthopedics;  Laterality: Left;   BIOPSY  04/30/2019   Procedure: BIOPSY;  Surgeon: Charna Elizabeth, MD;  [facility]: [facility];  Service: Endoscopy;;   BROW LIFT Bilateral 04/12/2021   Procedure: UPPER LID BLEPHAROPLASTY;  Surgeon: Peggye Form, DO;  Location: [facility];  Service: Plastics;  Laterality: Bilateral;   COLONOSCOPY  12/2013   polyps, int hem, diverticulosis, rpt 5 yrs [address])   COLONOSCOPY WITH PROPOFOL N/A 04/30/2019   Procedure: COLONOSCOPY WITH PROPOFOL;  Surgeon: Charna Elizabeth, MD;  [facility]: [facility];  Service: Endoscopy;  Laterality: N/A;   ELBOW SURGERY Right 2008   golfer's elbow   ESOPHAGOGASTRODUODENOSCOPY (EGD) WITH PROPOFOL N/A 04/30/2019   Procedure: ESOPHAGOGASTRODUODENOSCOPY (EGD) WITH PROPOFOL;  Surgeon: Charna Elizabeth, MD;  [facility];  Service: Endoscopy;  Laterality: N/A;   HERNIA REPAIR N/A    Phreesia 02/21/2020   INCISIONAL HERNIA REPAIR N/A 10/15/2019   Procedure: HERNIA REPAIR INCISIONAL WITH MESH, TAR;  Surgeon:  Axel Filler, MD;  Location: [facility] OR;  Service: General;  Laterality: N/A;   INGUINAL HERNIA REPAIR Bilateral childhood & ~1995   LAPAROTOMY N/A 01/05/2019   Procedure: EXPLORATORY LAPAROTOMY graham patch repair;  Surgeon: Sung Amabile, DO;  Location: [facility] ORS;  Service: General;  Laterality: N/A;   LAPAROTOMY N/A 10/15/2019   Procedure: EXPLORATORY LAPAROTOMY;  Surgeon: Axel Filler, MD;  Location: [facility] OR;  Service: General;  Laterality: N/A;   LYSIS OF ADHESION N/A 10/15/2019   Procedure: LYSIS OF ADHESION;  Surgeon: Axel Filler,  MD;  Location: [facility] OR;  Service: General;  Laterality: N/A;   NASAL SEPTUM SURGERY  2013   deviated septum   ORIF ANKLE FRACTURE Left 05/09/2018   Procedure: OPEN REDUCTION INTERNAL FIXATION LEFT LISFRANC FRACTURE/DISLOCATION;  Surgeon: Nadara Mustard, MD;  Location: [facility] OR;  Service: Orthopedics;  Laterality: Left;   POLYPECTOMY  04/30/2019   Procedure: POLYPECTOMY;  Surgeon: Charna Elizabeth, MD;  [facility];  Service: Endoscopy;;   TEE WITHOUT CARDIOVERSION N/A 06/29/2019   Procedure: TRANSESOPHAGEAL ECHOCARDIOGRAM (TEE);  Surgeon: Elease Hashimoto Deloris Ping, MD;  Location: [facility];  Service: Cardiovascular;  Laterality: N/A;   US ECHOCARDIOGRAPHY  02/2009   WNL, EF >55% [facility])   US RENAL/AORTA Right 05/2009   1-59% diameter reduction renal artery, rec rpt 2 yrs   VASECTOMY N/A    Phreesia 02/21/2020   WEIL OSTEOTOMY Left 11/16/2020   Procedure: WEIL OSTEOTOMY LEFT 2ND METATARSAL,  2ND TOE DISTAL INTERPHALANGEAL RESECTION AND Quintella Reichert OSTEOTOMY WEIL OSTEOTOMY THIRD METATARSAL;  Surgeon: Nadara Mustard, MD;  Location[facility];  Service: Orthopedics;  Laterality: Left;   Social History   Occupational History   Not on file  Tobacco Use   Smoking status: Never   Smokeless tobacco: Never  Vaping Use   Vaping Use: Never used  Substance and Sexual Activity   Alcohol use: Not Currently   Drug use: No   Sexual activity: Yes    Birth control/protection: None

## 2022-05-09 DIAGNOSIS — M546 Pain in thoracic spine: Secondary | ICD-10-CM | POA: Diagnosis not present

## 2022-05-18 DIAGNOSIS — S8002XA Contusion of left knee, initial encounter: Secondary | ICD-10-CM | POA: Diagnosis not present

## 2022-05-18 DIAGNOSIS — S5002XA Contusion of left elbow, initial encounter: Secondary | ICD-10-CM | POA: Diagnosis not present

## 2022-05-18 DIAGNOSIS — S42142A Displaced fracture of glenoid cavity of scapula, left shoulder, initial encounter for closed fracture: Secondary | ICD-10-CM | POA: Diagnosis not present

## 2022-05-21 ENCOUNTER — Other Ambulatory Visit: Payer: Self-pay | Admitting: Orthopedic Surgery

## 2022-05-21 DIAGNOSIS — S4292XA Fracture of left shoulder girdle, part unspecified, initial encounter for closed fracture: Secondary | ICD-10-CM

## 2022-05-22 ENCOUNTER — Ambulatory Visit
Admission: RE | Admit: 2022-05-22 | Discharge: 2022-05-22 | Disposition: A | Discharge status: Home / Self Care | Payer: BC Managed Care – PPO | Source: Ambulatory Visit | Attending: Orthopedic Surgery | Admitting: Orthopedic Surgery

## 2022-05-22 DIAGNOSIS — S4292XA Fracture of left shoulder girdle, part unspecified, initial encounter for closed fracture: Secondary | ICD-10-CM

## 2022-05-22 DIAGNOSIS — M25512 Pain in left shoulder: Secondary | ICD-10-CM | POA: Diagnosis not present

## 2022-05-29 ENCOUNTER — Encounter (HOSPITAL_BASED_OUTPATIENT_CLINIC_OR_DEPARTMENT_OTHER): Payer: Self-pay | Admitting: Orthopaedic Surgery

## 2022-05-29 ENCOUNTER — Encounter (HOSPITAL_BASED_OUTPATIENT_CLINIC_OR_DEPARTMENT_OTHER)
Admission: RE | Admit: 2022-05-29 | Discharge: 2022-05-29 | Disposition: A | Discharge status: Home / Self Care | Payer: BC Managed Care – PPO | Source: Ambulatory Visit | Attending: Orthopaedic Surgery | Admitting: Orthopaedic Surgery

## 2022-05-29 ENCOUNTER — Other Ambulatory Visit: Payer: Self-pay

## 2022-05-29 DIAGNOSIS — Z01812 Encounter for preprocedural laboratory examination: Secondary | ICD-10-CM | POA: Insufficient documentation

## 2022-05-29 DIAGNOSIS — I509 Heart failure, unspecified: Secondary | ICD-10-CM | POA: Diagnosis not present

## 2022-05-29 DIAGNOSIS — N189 Chronic kidney disease, unspecified: Secondary | ICD-10-CM | POA: Diagnosis not present

## 2022-05-29 DIAGNOSIS — Z8249 Family history of ischemic heart disease and other diseases of the circulatory system: Secondary | ICD-10-CM | POA: Diagnosis not present

## 2022-05-29 DIAGNOSIS — J4489 Other specified chronic obstructive pulmonary disease: Secondary | ICD-10-CM | POA: Diagnosis not present

## 2022-05-29 DIAGNOSIS — Y9355 Activity, bike riding: Secondary | ICD-10-CM | POA: Diagnosis not present

## 2022-05-29 DIAGNOSIS — M25312 Other instability, left shoulder: Secondary | ICD-10-CM | POA: Diagnosis present

## 2022-05-29 DIAGNOSIS — I130 Hypertensive heart and chronic kidney disease with heart failure and stage 1 through stage 4 chronic kidney disease, or unspecified chronic kidney disease: Secondary | ICD-10-CM | POA: Diagnosis not present

## 2022-05-29 DIAGNOSIS — Z79899 Other long term (current) drug therapy: Secondary | ICD-10-CM | POA: Insufficient documentation

## 2022-05-29 DIAGNOSIS — S42142A Displaced fracture of glenoid cavity of scapula, left shoulder, initial encounter for closed fracture: Secondary | ICD-10-CM | POA: Diagnosis not present

## 2022-05-29 LAB — BASIC METABOLIC PANEL
Anion gap: 5 (ref 5–15)
BUN: 13 mg/dL (ref 8–23)
CO2: 28 mmol/L (ref 22–32)
Calcium: 9.2 mg/dL (ref 8.9–10.3)
Chloride: 99 mmol/L (ref 98–111)
Creatinine, Ser: 0.91 mg/dL (ref 0.61–1.24)
GFR, Estimated: >60 mL/min (ref >60)
Glucose, Bld: 101 mg/dL — ABNORMAL HIGH (ref 70–99)
Potassium: 4.7 mmol/L (ref 3.5–5.1)
Sodium: 132 mmol/L — ABNORMAL LOW (ref 135–145)
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
## 2022-05-30 NOTE — Discharge Instructions (Signed)
Ramond Marrow MD, MPH Alfonse Alpers, PA-C [facility] [address], [address] [phone] (tel)   [phone] (fax)   POST-OPERATIVE INSTRUCTIONS - SHOULDER ARTHROSCOPY  WOUND CARE You may remove the Operative Dressing on Post-Op Day #3 (72hrs after surgery).   Alternatively if you would like you can leave dressing on until follow-up if within 7-8 days but keep it dry. Leave steri-strips in place until they fall off on their own, usually 2 weeks postop. There may be a small amount of fluid/bleeding leaking at the surgical site.  This is normal; the shoulder is filled with fluid during the procedure and can leak for 24-48hrs after surgery.  You may change/reinforce the bandage as needed.  Use the Cryocuff or Ice as often as possible for the first 7 days, then as needed for pain relief. Always keep a towel, ACE wrap or other barrier between the cooling unit and your skin.  You may shower on Post-Op Day #3. Gently pat the area dry.  Do not soak the shoulder in water or submerge it.  Keep incisions as dry as possible. Do not go swimming in the pool or ocean until 4 weeks after surgery or when otherwise instructed.    EXERCISES Wear the sling at all times  You may remove the sling for showering, but keep the arm across the chest or in a secondary sling.     It is normal for your fingers/hand to become more swollen after surgery and discolored from bruising.   This will resolve over the first few weeks usually after surgery. Please continue to ambulate and do not stay sitting or lying for too long.  Perform foot and wrist pumps to assist in circulation.  PHYSICAL THERAPY - No physical therapy for 4-6 weeks after surgery   REGIONAL ANESTHESIA (NERVE BLOCKS) The anesthesia team may have performed a nerve block for you this is a great tool used to minimize pain.   The block may start wearing off overnight (between 8-24 hours postop) When the block wears off, your  pain may go from nearly zero to the pain you would have had postop without the block. This is an abrupt transition but nothing dangerous is happening.   This can be a challenging period but utilize your as needed pain medications to try and manage this period. We suggest you use the pain medication the first night prior to going to bed, to ease this transition.  You may take an extra dose of narcotic when this happens if needed  POST-OP MEDICATIONS- Multimodal approach to pain control In general your pain will be controlled with a combination of substances.  Prescriptions unless otherwise discussed are electronically sent to your pharmacy.  This is a carefully made plan we use to minimize narcotic use.      Acetaminophen - Non-narcotic pain medicine taken on a scheduled basis  Oxycodone - This is a strong narcotic, to be used only on an "as needed" basis for SEVERE pain. Zofran - take as needed for nausea   FOLLOW-UP If you develop a Fever (?101.5), Redness or Drainage from the surgical incision site, please call our office to arrange for an evaluation. Please call the office to schedule a follow-up appointment for your first post-operative appointment, 7-10 days post-operatively.    HELPFUL INFORMATION   You may be more comfortable sleeping in a semi-seated position the first few nights following surgery.  Keep a pillow propped under the elbow and forearm for comfort.  If you have a recliner type of chair it might be beneficial.  If not that is fine too, but it would be helpful to sleep propped up with pillows behind your operated shoulder as well under your elbow and forearm.  This will reduce pulling on the suture lines.  When dressing, put your operative arm in the sleeve first.  When getting undressed, take your operative arm out last.  Loose fitting, button-down shirts are recommended.  Often in the first days after surgery you may be more comfortable keeping your operative arm under  your shirt and not through the sleeve.  You may return to work/school in the next couple of days when you feel up to it.  Desk work and typing in the sling is fine.  We suggest you use the pain medication the first night prior to going to bed, in order to ease any pain when the anesthesia wears off. You should avoid taking pain medications on an empty stomach as it will make you nauseous.  You should wean off your narcotic medicines as soon as you are able.  Most patients will be off narcotics before their first postop appointment.   Do not drink alcoholic beverages or take illicit drugs when taking pain medications.  It is against the law to drive while taking narcotics.  In some states it is against the law to drive while your arm is in a sling.   Pain medication may make you constipated.  Below are a few solutions to try in this order: Decrease the amount of pain medication if you aren't having pain. Drink lots of decaffeinated fluids. Drink prune juice and/or eat dried prunes  If the first 3 don't work start with additional solutions Take Colace - an over-the-counter stool softener Take Senokot - an over-the-counter laxative Take Miralax - a stronger over-the-counter laxative  For more information including helpful videos and documents visit our website:   [email or web address]   Post Anesthesia Home Care Instructions  Activity: Get plenty of rest for the remainder of the day. A responsible individual must stay with you for 24 hours following the procedure.  For the next 24 hours, DO NOT: -Drive a car -Advertising copywriter -Drink alcoholic beverages -Take any medication unless instructed by your physician -Make any legal decisions or sign important papers.  Meals: Start with liquid foods such as gelatin or soup. Progress to regular foods as tolerated. Avoid greasy, spicy, heavy foods. If nausea and/or vomiting occur, drink only clear liquids  until the nausea and/or vomiting subsides. Call your physician if vomiting continues.  Special Instructions/Symptoms: Your throat may feel dry or sore from the anesthesia or the breathing tube placed in your throat during surgery. If this causes discomfort, gargle with warm salt water. The discomfort should disappear within 24 hours.   Regional Anesthesia Blocks  1. Numbness or the inability to move the "blocked" extremity may last from 3-48 hours after placement. The length of time depends on the medication injected and your individual response to the medication. If the numbness is not going away after 48 hours, call your surgeon.  2. The extremity that is blocked will need to be protected until the numbness is gone and the  Strength has returned. Because you cannot feel it, you will need to take extra care to avoid injury. Because it may be weak, you may have difficulty moving it or using it. You may not know what position it is in without looking at it while the block  is in effect.  3. For blocks in the legs and feet, returning to weight bearing and walking needs to be done carefully. You will need to wait until the numbness is entirely gone and the strength has returned. You should be able to move your leg and foot normally before you try and bear weight or walk. You will need someone to be with you when you first try to ensure you do not fall and possibly risk injury.  4. Bruising and tenderness at the needle site are common side effects and will resolve in a few days.  5. Persistent numbness or new problems with movement should be communicated to the surgeon or the [facility] [phone] [facility] [phone]).  Regional   Anesthesia Blocks  1. Numbness or the inability to move the "blocked" extremity may last from 3-48 hours after placement. The length of time depends on the medication injected and your individual response to the medication. If the  numbness is not going away after 48 hours, call your surgeon.  2. The extremity that is blocked will need to be protected until the numbness is gone and the  Strength has returned. Because you cannot feel it, you will need to take extra care to avoid injury. Because it may be weak, you may have difficulty moving it or using it. You may not know what position it is in without looking at it while the block is in effect.  3. For blocks in the legs and feet, returning to weight bearing and walking needs to be done carefully. You will need to wait until the numbness is entirely gone and the strength has returned. You should be able to move your leg and foot normally before you try and bear weight or walk. You will need someone to be with you when you first try to ensure you do not fall and possibly risk injury.  4. Bruising and tenderness at the needle site are common side effects and will resolve in a few days.  5. Persistent numbness or new problems with movement should be communicated to the surgeon or the [facility] [phone] [facility] [phone]).  Information for Discharge Teaching: EXPAREL (bupivacaine liposome injectable suspension)   Your surgeon or anesthesiologist gave you EXPAREL(bupivacaine) to help control your pain after surgery.  EXPAREL is a local anesthetic that provides pain relief by numbing the tissue around the surgical site. EXPAREL is designed to release pain medication over time and can control pain for up to 72 hours. Depending on how you respond to EXPAREL, you may require less pain medication during your recovery.  Possible side effects: Temporary loss of sensation or ability to move in the area where bupivacaine was injected. Nausea, vomiting, constipation Rarely, numbness and tingling in your mouth or lips, lightheadedness, or anxiety may occur. Call your doctor right away if you think you may be experiencing any of these sensations,  or if you have other questions regarding possible side effects.  Follow all other discharge instructions given to you by your surgeon or nurse. Eat a healthy diet and drink plenty of water or other fluids.  If you return to the hospital for any reason within 96 hours following the administration of EXPAREL, it is important for health care providers to know that you have received this anesthetic. A teal colored band has been placed on your arm with the date, time and amount of EXPAREL you have received in order to  alert and inform your health care providers. Please leave this armband in place for the full 96 hours following administration, and then you may remove the band.

## 2022-05-31 ENCOUNTER — Other Ambulatory Visit: Payer: Self-pay

## 2022-05-31 ENCOUNTER — Ambulatory Visit (HOSPITAL_BASED_OUTPATIENT_CLINIC_OR_DEPARTMENT_OTHER): Payer: BC Managed Care – PPO | Admitting: Certified Registered"

## 2022-05-31 ENCOUNTER — Encounter (HOSPITAL_BASED_OUTPATIENT_CLINIC_OR_DEPARTMENT_OTHER): Payer: Self-pay | Admitting: Orthopaedic Surgery

## 2022-05-31 ENCOUNTER — Ambulatory Visit (HOSPITAL_BASED_OUTPATIENT_CLINIC_OR_DEPARTMENT_OTHER)
Admission: RE | Admit: 2022-05-31 | Discharge: 2022-05-31 | Disposition: A | Discharge status: Home / Self Care | Payer: BC Managed Care – PPO | Source: Ambulatory Visit | Attending: Orthopaedic Surgery | Admitting: Orthopaedic Surgery

## 2022-05-31 ENCOUNTER — Encounter (HOSPITAL_BASED_OUTPATIENT_CLINIC_OR_DEPARTMENT_OTHER)
Admission: RE | Disposition: A | Discharge status: Home / Self Care | Payer: Self-pay | Source: Ambulatory Visit | Attending: Orthopaedic Surgery

## 2022-05-31 DIAGNOSIS — M25312 Other instability, left shoulder: Secondary | ICD-10-CM | POA: Insufficient documentation

## 2022-05-31 DIAGNOSIS — I130 Hypertensive heart and chronic kidney disease with heart failure and stage 1 through stage 4 chronic kidney disease, or unspecified chronic kidney disease: Secondary | ICD-10-CM | POA: Insufficient documentation

## 2022-05-31 DIAGNOSIS — Z8249 Family history of ischemic heart disease and other diseases of the circulatory system: Secondary | ICD-10-CM | POA: Insufficient documentation

## 2022-05-31 DIAGNOSIS — J4489 Other specified chronic obstructive pulmonary disease: Secondary | ICD-10-CM | POA: Insufficient documentation

## 2022-05-31 DIAGNOSIS — Z79899 Other long term (current) drug therapy: Secondary | ICD-10-CM

## 2022-05-31 DIAGNOSIS — I509 Heart failure, unspecified: Secondary | ICD-10-CM | POA: Insufficient documentation

## 2022-05-31 DIAGNOSIS — N189 Chronic kidney disease, unspecified: Secondary | ICD-10-CM | POA: Insufficient documentation

## 2022-05-31 DIAGNOSIS — S42142A Displaced fracture of glenoid cavity of scapula, left shoulder, initial encounter for closed fracture: Secondary | ICD-10-CM | POA: Insufficient documentation

## 2022-05-31 DIAGNOSIS — Y9355 Activity, bike riding: Secondary | ICD-10-CM | POA: Insufficient documentation

## 2022-05-31 DIAGNOSIS — Z01818 Encounter for other preprocedural examination: Secondary | ICD-10-CM

## 2022-05-31 HISTORY — PX: SHOULDER ARTHROSCOPY WITH BANKART REPAIR: SHX5673

## 2022-05-31 HISTORY — PX: SHOULDER ARTHROSCOPY: SHX128

## 2022-05-31 SURGERY — SHOULDER ARTHROSCOPY WITH BANKART REPAIR
Anesthesia: Regional | Site: Shoulder | Laterality: Left

## 2022-05-31 MED ORDER — FENTANYL CITRATE (PF) 100 MCG/2ML IJ SOLN: 25.0000 ug | INTRAMUSCULAR | Status: DC | PRN

## 2022-05-31 MED ORDER — OXYCODONE HCL 5 MG PO TABS: 5.0000 mg | ORAL_TABLET | Freq: Once | ORAL | Status: DC | PRN

## 2022-05-31 MED ORDER — PHENYLEPHRINE HCL-NACL 20-0.9 MG/250ML-% IV SOLN
INTRAVENOUS | Status: DC | PRN
  Administered 2022-05-31: 45 ug/min via INTRAVENOUS

## 2022-05-31 MED ORDER — DEXAMETHASONE SODIUM PHOSPHATE 4 MG/ML IJ SOLN
INTRAMUSCULAR | Status: DC | PRN
  Administered 2022-05-31: 5 mg via INTRAVENOUS

## 2022-05-31 MED ORDER — EPHEDRINE SULFATE (PRESSORS) 50 MG/ML IJ SOLN
INTRAMUSCULAR | Status: DC | PRN
  Administered 2022-05-31: 5 mg via INTRAVENOUS
  Administered 2022-05-31: 10 mg via INTRAVENOUS

## 2022-05-31 MED ORDER — FENTANYL CITRATE (PF) 100 MCG/2ML IJ SOLN
100.0000 ug | Freq: Once | INTRAMUSCULAR | Status: AC
  Administered 2022-05-31: 100 ug via INTRAVENOUS

## 2022-05-31 MED ORDER — SUGAMMADEX SODIUM 200 MG/2ML IV SOLN
INTRAVENOUS | Status: DC | PRN
  Administered 2022-05-31: 200 mg via INTRAVENOUS

## 2022-05-31 MED ORDER — LACTATED RINGERS IV SOLN: INTRAVENOUS | Status: DC

## 2022-05-31 MED ORDER — PHENYLEPHRINE HCL (PRESSORS) 10 MG/ML IV SOLN
INTRAVENOUS | Status: DC | PRN
  Administered 2022-05-31: 160 ug via INTRAVENOUS

## 2022-05-31 MED ORDER — ONDANSETRON HCL 4 MG/2ML IJ SOLN
INTRAMUSCULAR | Status: AC
  Filled 2022-05-31: qty 2

## 2022-05-31 MED ORDER — BUPIVACAINE HCL (PF) 0.5 % IJ SOLN
INTRAMUSCULAR | Status: DC | PRN
  Administered 2022-05-31: 10 mL via PERINEURAL

## 2022-05-31 MED ORDER — LIDOCAINE HCL (CARDIAC) PF 100 MG/5ML IV SOSY
PREFILLED_SYRINGE | INTRAVENOUS | Status: DC | PRN
  Administered 2022-05-31: 60 mg via INTRAVENOUS

## 2022-05-31 MED ORDER — DEXAMETHASONE SODIUM PHOSPHATE 10 MG/ML IJ SOLN
INTRAMUSCULAR | Status: AC
  Filled 2022-05-31: qty 1

## 2022-05-31 MED ORDER — MIDAZOLAM HCL 2 MG/2ML IJ SOLN
INTRAMUSCULAR | Status: AC
  Filled 2022-05-31: qty 2

## 2022-05-31 MED ORDER — ACETAMINOPHEN 160 MG/5ML PO SOLN: 325.0000 mg | ORAL | Status: DC | PRN

## 2022-05-31 MED ORDER — OXYCODONE HCL 5 MG/5ML PO SOLN: 5.0000 mg | Freq: Once | ORAL | Status: DC | PRN

## 2022-05-31 MED ORDER — PROPOFOL 500 MG/50ML IV EMUL
INTRAVENOUS | Status: DC | PRN
  Administered 2022-05-31: 200 ug/kg/min via INTRAVENOUS

## 2022-05-31 MED ORDER — ROCURONIUM BROMIDE 10 MG/ML (PF) SYRINGE
PREFILLED_SYRINGE | INTRAVENOUS | Status: AC
  Filled 2022-05-31: qty 10

## 2022-05-31 MED ORDER — CEFAZOLIN SODIUM-DEXTROSE 2-4 GM/100ML-% IV SOLN
INTRAVENOUS | Status: AC
  Filled 2022-05-31: qty 100

## 2022-05-31 MED ORDER — CEFAZOLIN SODIUM-DEXTROSE 2-4 GM/100ML-% IV SOLN
2.0000 g | INTRAVENOUS | Status: AC
  Administered 2022-05-31: 2 g via INTRAVENOUS

## 2022-05-31 MED ORDER — SODIUM CHLORIDE 0.9 % IR SOLN
Status: DC | PRN
  Administered 2022-05-31: 6000 mL

## 2022-05-31 MED ORDER — BUPIVACAINE LIPOSOME 1.3 % IJ SUSP
INTRAMUSCULAR | Status: DC | PRN
  Administered 2022-05-31: 10 mL via PERINEURAL

## 2022-05-31 MED ORDER — ONDANSETRON HCL 4 MG PO TABS: 4.0000 mg | ORAL_TABLET | Freq: Three times a day (TID) | ORAL | 0 refills | Status: AC | PRN

## 2022-05-31 MED ORDER — ACETAMINOPHEN 500 MG PO TABS: 1000.0000 mg | ORAL_TABLET | Freq: Three times a day (TID) | ORAL | 0 refills | Status: AC

## 2022-05-31 MED ORDER — ONDANSETRON HCL 4 MG/2ML IJ SOLN
INTRAMUSCULAR | Status: DC | PRN
  Administered 2022-05-31: 4 mg via INTRAVENOUS

## 2022-05-31 MED ORDER — MIDAZOLAM HCL 2 MG/2ML IJ SOLN
2.0000 mg | Freq: Once | INTRAMUSCULAR | Status: AC
  Administered 2022-05-31: 2 mg via INTRAVENOUS

## 2022-05-31 MED ORDER — MEPERIDINE HCL 25 MG/ML IJ SOLN: 6.2500 mg | INTRAMUSCULAR | Status: DC | PRN

## 2022-05-31 MED ORDER — FENTANYL CITRATE (PF) 100 MCG/2ML IJ SOLN
INTRAMUSCULAR | Status: AC
  Filled 2022-05-31: qty 2

## 2022-05-31 MED ORDER — OXYCODONE HCL 5 MG PO TABS: ORAL_TABLET | ORAL | 0 refills | Status: AC

## 2022-05-31 MED ORDER — LIDOCAINE 2% (20 MG/ML) 5 ML SYRINGE
INTRAMUSCULAR | Status: AC
  Filled 2022-05-31: qty 5

## 2022-05-31 MED ORDER — PROPOFOL 10 MG/ML IV BOLUS
INTRAVENOUS | Status: AC
  Filled 2022-05-31: qty 20

## 2022-05-31 MED ORDER — ONDANSETRON HCL 4 MG/2ML IJ SOLN: 4.0000 mg | Freq: Once | INTRAMUSCULAR | Status: DC | PRN

## 2022-05-31 MED ORDER — ACETAMINOPHEN 325 MG PO TABS: 325.0000 mg | ORAL_TABLET | ORAL | Status: DC | PRN

## 2022-05-31 MED ORDER — PROPOFOL 10 MG/ML IV BOLUS
INTRAVENOUS | Status: DC | PRN
  Administered 2022-05-31: 200 mg via INTRAVENOUS

## 2022-05-31 MED ORDER — ROCURONIUM BROMIDE 100 MG/10ML IV SOLN
INTRAVENOUS | Status: DC | PRN
  Administered 2022-05-31: 50 mg via INTRAVENOUS

## 2022-05-31 SURGICAL SUPPLY — 74 items
AID PSTN UNV HD RSTRNT DISP (MISCELLANEOUS) ×1
ANCH SUT 2 FBRTK KNTLS 1.8 (Anchor) ×2 IMPLANT
ANCH SUT 5 FBRTK 2.6 KNTLS SLF (Anchor) ×2 IMPLANT
ANCHOR SUT 1.8 FIBERTAK SB KL (Anchor) IMPLANT
ANCHOR SUT FBRTK 2.6 SP #5 (Anchor) IMPLANT
APL PRP STRL LF DISP 70% ISPRP (MISCELLANEOUS) ×1
BLADE EXCALIBUR 4.0X13 (MISCELLANEOUS) ×1 IMPLANT
BLADE SURG 10 STRL SS (BLADE) ×1 IMPLANT
BLADE SURG 15 STRL LF DISP TIS (BLADE) ×1 IMPLANT
BLADE SURG 15 STRL SS (BLADE) ×1
BUR OVAL CARBIDE 4.0 (BURR) IMPLANT
BUR SURG 4D 13L RD FLUTE (BUR) IMPLANT
BURR SURG 4D 13L RD FLUTE (BUR)
CANNULA 5.75X71 LONG (CANNULA) ×1 IMPLANT
CANNULA TWIST IN 8.25X7CM (CANNULA) IMPLANT
CHLORAPREP W/TINT 26 (MISCELLANEOUS) ×1 IMPLANT
CLSR STERI-STRIP ANTIMIC 1/2X4 (GAUZE/BANDAGES/DRESSINGS) ×1 IMPLANT
COOLER ICEMAN CLASSIC (MISCELLANEOUS) ×1 IMPLANT
COVER BACK TABLE 60X90IN (DRAPES) ×1 IMPLANT
DRAPE C-ARM 42X72 X-RAY (DRAPES) ×1 IMPLANT
DRAPE IMP U-DRAPE 54X76 (DRAPES) ×1 IMPLANT
DRAPE INCISE IOBAN 66X45 STRL (DRAPES) IMPLANT
DRAPE POUCH INSTRU U-SHP 10X18 (DRAPES) ×1 IMPLANT
DRAPE SHOULDER BEACH CHAIR (DRAPES) ×1 IMPLANT
DRAPE U-SHAPE 76X120 STRL (DRAPES) ×2 IMPLANT
DRSG AQUACEL AG ADV 3.5X 6 (GAUZE/BANDAGES/DRESSINGS) IMPLANT
DW OUTFLOW CASSETTE/TUBE SET (MISCELLANEOUS) ×1 IMPLANT
ELECT REM PT RETURN 9FT ADLT (ELECTROSURGICAL) ×1
ELECTRODE REM PT RTRN 9FT ADLT (ELECTROSURGICAL) ×1 IMPLANT
GAUZE PAD ABD 8X10 STRL (GAUZE/BANDAGES/DRESSINGS) ×1 IMPLANT
GAUZE SPONGE 4X4 12PLY STRL (GAUZE/BANDAGES/DRESSINGS) ×1 IMPLANT
GLOVE BIO SURGEON STRL SZ 6.5 (GLOVE) ×1 IMPLANT
GLOVE BIOGEL PI IND STRL 6.5 (GLOVE) ×1 IMPLANT
GLOVE BIOGEL PI IND STRL 8 (GLOVE) ×1 IMPLANT
GLOVE ECLIPSE 8.0 STRL XLNG CF (GLOVE) ×1 IMPLANT
GOWN STRL REUS W/ TWL LRG LVL3 (GOWN DISPOSABLE) ×2 IMPLANT
GOWN STRL REUS W/TWL LRG LVL3 (GOWN DISPOSABLE) ×2
GOWN STRL REUS W/TWL XL LVL3 (GOWN DISPOSABLE) ×1 IMPLANT
KIT ANCHOR FBRTK 2.6 STR (KITS) IMPLANT
KIT PERC INSERT 3.0 KNTLS (KITS) IMPLANT
KIT STABILIZATION SHOULDER (MISCELLANEOUS) ×1 IMPLANT
KIT STR SPEAR 1.8 FBRTK DISP (KITS) IMPLANT
LASSO 90 CVE QUICKPAS (DISPOSABLE) IMPLANT
LASSO CRESCENT QUICKPASS (SUTURE) IMPLANT
MANIFOLD NEPTUNE II (INSTRUMENTS) ×1 IMPLANT
NDL SAFETY ECLIP 18X1.5 (MISCELLANEOUS) ×1 IMPLANT
PACK ARTHROSCOPY DSU (CUSTOM PROCEDURE TRAY) ×1 IMPLANT
PACK BASIN DAY SURGERY FS (CUSTOM PROCEDURE TRAY) ×1 IMPLANT
PAD COLD SHLDR WRAP-ON (PAD) ×1 IMPLANT
PAD ORTHO SHOULDER 7X19 LRG (SOFTGOODS) IMPLANT
PENCIL SMOKE EVACUATOR (MISCELLANEOUS) ×1 IMPLANT
PORT APPOLLO RF 90DEGREE MULTI (SURGICAL WAND) IMPLANT
RESTRAINT HEAD UNIVERSAL NS (MISCELLANEOUS) ×1 IMPLANT
SHEET MEDIUM DRAPE 40X70 STRL (DRAPES) ×1 IMPLANT
SLEEVE ARM SUSPENSION SYSTEM (MISCELLANEOUS) IMPLANT
SLEEVE SCD COMPRESS KNEE MED (STOCKING) ×1 IMPLANT
SLING S3 LATERAL DISP (MISCELLANEOUS) IMPLANT
SPIKE FLUID TRANSFER (MISCELLANEOUS) IMPLANT
SPONGE T-LAP 18X18 ~~LOC~~+RFID (SPONGE) ×1 IMPLANT
SUT FIBERWIRE #2 38 T-5 BLUE (SUTURE) ×2
SUT MNCRL AB 4-0 PS2 18 (SUTURE) ×1 IMPLANT
SUT PDS AB 0 CT 36 (SUTURE) IMPLANT
SUT VIC AB 0 CT1 27 (SUTURE)
SUT VIC AB 0 CT1 27XBRD ANBCTR (SUTURE) ×1 IMPLANT
SUT VIC AB 3-0 SH 27 (SUTURE)
SUT VIC AB 3-0 SH 27X BRD (SUTURE) ×1 IMPLANT
SUTURE FIBERWR #2 38 T-5 BLUE (SUTURE) ×2 IMPLANT
SUTURE TAPE TIGERLINK 1.3MM BL (SUTURE) IMPLANT
SUTURETAPE TIGERLINK 1.3MM BL (SUTURE)
SYR 5ML LL (SYRINGE) ×1 IMPLANT
TOWEL GREEN STERILE FF (TOWEL DISPOSABLE) ×3 IMPLANT
TUBE CONNECTING 20X1/4 (TUBING) IMPLANT
TUBE SUCTION HIGH CAP CLEAR NV (SUCTIONS) ×1 IMPLANT
TUBING ARTHROSCOPY IRRIG 16FT (MISCELLANEOUS) ×1 IMPLANT

## 2022-05-31 NOTE — Interval H&P Note (Signed)
All questions answered, patient wants to proceed with procedure. ? ?

## 2022-05-31 NOTE — Progress Notes (Signed)
Assisted Dr. Oddono with left, interscalene , ultrasound guided block. Side rails up, monitors on throughout procedure. See vital signs in flow sheet. Tolerated Procedure well. 

## 2022-05-31 NOTE — Anesthesia Preprocedure Evaluation (Addendum)
Anesthesia Evaluation  Patient identified by MRN, date of birth, ID band Patient awake    Reviewed: Allergy & Precautions, H&P , NPO status , [name] Chart, lab work & pertinent test results  History of Anesthesia Complications (+) AWARENESS UNDER ANESTHESIA and history of anesthetic complications  Airway Mallampati: II  TM Distance: >3 FB Neck ROM: Full    Dental no notable dental hx. (+) Teeth Intact, Caps, Dental Advisory Given   Pulmonary asthma , sleep apnea and Continuous Positive Airway Pressure Ventilation , COPD   Pulmonary exam normal breath sounds clear to auscultation       Cardiovascular hypertension, Pt. on medications and Pt. on home beta blockers + Peripheral Vascular Disease and +CHF  Normal cardiovascular exam(-) dysrhythmias  Rhythm:Regular Rate:Normal     Neuro/Psych   Anxiety     negative neurological ROS  negative psych ROS   GI/Hepatic ,GERD  Medicated and Controlled,,  Endo/Other  negative endocrine ROS    Renal/GU negative Renal ROS  negative genitourinary   Musculoskeletal  (+) Arthritis , Osteoarthritis,    Abdominal   Peds negative pediatric ROS (+)  Hematology  (+) Blood dyscrasia, anemia   Anesthesia Other Findings   Reproductive/Obstetrics negative OB ROS                              Anesthesia Physical Anesthesia Plan  ASA: 3  Anesthesia Plan: General and Regional   Post-op Pain Management: Minimal or no pain anticipated and Regional block*   Induction: Intravenous  PONV Risk Score and Plan: 2 and Ondansetron, Dexamethasone and Treatment may vary due to age or medical condition  Airway Management Planned: LMA and Oral ETT  Additional Equipment: None  Intra-op Plan:   Post-operative Plan: Extubation in OR  Informed Consent: I have reviewed the patients History and Physical, chart, labs and discussed the procedure including the risks,  benefits and alternatives for the proposed anesthesia with the patient or authorized representative who has indicated his/her understanding and acceptance.     Dental advisory given  Plan Discussed with: CRNA, Surgeon and Anesthesiologist  Anesthesia Plan Comments:          Anesthesia Quick Evaluation

## 2022-05-31 NOTE — Transfer of Care (Signed)
Immediate Anesthesia Transfer of Care Note  Patient: Louis [name]  Procedure(s) Performed: SHOULDER ARTHROSCOPY WITH BANKART REPAIR (Left: Shoulder) ARTHROSCOPY SHOULDER DEBRIDEMENT (Left: Shoulder)  Patient Location: PACU  Anesthesia Type:General and Regional  Level of Consciousness: awake, alert , and patient cooperative  Airway & Oxygen Therapy: Patient Spontanous Breathing and Patient connected to face mask oxygen  Post-op Assessment: Report given to RN and Post -op Vital signs reviewed and stable  Post vital signs: Reviewed and stable  Last Vitals:  Vitals Value Taken Time  BP 107/62 05/31/22 1224  Temp    Pulse 78 05/31/22 1226  Resp 17 05/31/22 1226  SpO2 92 % 05/31/22 1226  Vitals shown include unvalidated device data.  Last Pain:  Vitals:   05/31/22 1003  TempSrc: Oral  PainSc: 0-No pain         Complications: No notable events documented.

## 2022-05-31 NOTE — Anesthesia Procedure Notes (Signed)
Anesthesia Regional Block: Interscalene brachial plexus block   Pre-Anesthetic Checklist: , timeout performed,  Correct Patient, Correct Site, Correct Laterality,  Correct Procedure, Correct Position, site marked,  Risks and benefits discussed,  Surgical consent,  Pre-op evaluation,  At surgeon's request and post-op pain management  Laterality: Left  Prep: chloraprep       Needles:  Injection technique: Single-shot  Needle Type: Echogenic Stimulator Needle     Needle Length: 5cm  Needle Gauge: 22     Additional Needles:   Procedures:, nerve stimulator,,, ultrasound used (permanent image in chart),,     Nerve Stimulator or Paresthesia:  Response: quadraceps contraction, 0.45 mA  Additional Responses:   Narrative:  Start time: 05/31/2022 10:30 AM End time: 05/31/2022 10:35 AM Injection made incrementally with aspirations every 5 mL.  Performed by: Personally  Anesthesiologist: Bethena Midget, MD  Additional Notes: Functioning IV was confirmed and monitors were applied.  A 50mm 22ga Arrow echogenic stimulator needle was used. Sterile prep and drape,hand hygiene and sterile gloves were used. Ultrasound guidance: relevant anatomy identified, needle position confirmed, local anesthetic spread visualized around nerve(s)., vascular puncture avoided.  Image printed for medical record. Negative aspiration and negative test dose prior to incremental administration of local anesthetic. The patient tolerated the procedure well.

## 2022-05-31 NOTE — Anesthesia Postprocedure Evaluation (Signed)
Anesthesia Post Note  Patient: Louis [name]  Procedure(s) Performed: SHOULDER ARTHROSCOPY WITH BANKART REPAIR (Left: Shoulder) ARTHROSCOPY SHOULDER DEBRIDEMENT (Left: Shoulder)     Patient location during evaluation: PACU Anesthesia Type: Regional and General Level of consciousness: awake Pain management: pain level controlled Vital Signs Assessment: post-procedure vital signs reviewed and stable Respiratory status: spontaneous breathing, nonlabored ventilation and respiratory function stable Cardiovascular status: blood pressure returned to baseline and stable Postop Assessment: no apparent nausea or vomiting Anesthetic complications: no   No notable events documented.  Last Vitals:  Vitals:   05/31/22 1300 05/31/22 1311  BP:  118/74  Pulse: 67 68  Resp: 13 16  Temp:  (!) 36.4 C  SpO2: 92% 92%    Last Pain:  Vitals:   05/31/22 1311  TempSrc: Oral  PainSc: 0-No pain                 Lisseth Brazeau P Keymora Grillot

## 2022-05-31 NOTE — Anesthesia Procedure Notes (Signed)
Procedure Name: Intubation Date/Time: 05/31/2022 11:19 AM  Performed by: Karen Kitchens, CRNAPre-anesthesia Checklist: Patient identified, Emergency Drugs available, Suction available and Patient being monitored Patient Re-evaluated:Patient Re-evaluated prior to induction Oxygen Delivery Method: Circle system utilized Preoxygenation: Pre-oxygenation with 100% oxygen Induction Type: IV induction Ventilation: Mask ventilation without difficulty Laryngoscope Size: Mac and 4 Grade View: Grade I Tube type: Oral Tube size: 7.0 mm Number of attempts: 1 Airway Equipment and Method: Stylet and Oral airway Placement Confirmation: ETT inserted through vocal cords under direct vision, positive ETCO2, breath sounds checked- equal and bilateral and CO2 detector Secured at: 24 cm Tube secured with: Tape Dental Injury: Teeth and Oropharynx as per pre-operative assessment

## 2022-06-01 ENCOUNTER — Encounter (HOSPITAL_BASED_OUTPATIENT_CLINIC_OR_DEPARTMENT_OTHER): Payer: Self-pay | Admitting: Orthopaedic Surgery

## 2022-06-08 DIAGNOSIS — M24112 Other articular cartilage disorders, left shoulder: Secondary | ICD-10-CM | POA: Diagnosis not present

## 2022-06-11 NOTE — Op Note (Signed)
Orthopaedic Surgery Operative Note (CSN: [id number])  Louis [name]  1955-01-02 Date of Surgery: 05/31/2022   Diagnoses:  LEFT SHOULDER INSTABILITY, SCAPULA FRACTURE  Procedure: Arthroscopic extensive debridement, humeral bone, humeral cartilage, glenoid bone, glenoid cartilage.  Cartilaginous debris, rotator interval and fracture fragments. Open reduction internal fixation with arthroscopic assistance of glenoid fracture Arthroscopic capsulorrhaphy    Operative Finding Patient extensive fracture involving the anterior third of the inferior glenoid.  We able to mobilize the fragments in their medialized position as well as the labrum.  The fragments were quite comminuted.  We used a series of techniques to pass sutures and used two 2.6 fiber tack anchors to secure the fracture fragments.  We then imbricated the capsule and labrum around these fragments using 1.8 fiber tacks above and below.  Post-operative plan: The patient will be non-weightbearing in a sling for 6 weeks with PT to start after.  The patient will be discharged home.  DVT prophylaxis not indicated in ambulatory upper extremity patient without known risk factors.   Pain control with PRN pain medication preferring oral medicines.  Follow up plan will be scheduled in approximately 7 days for incision check and XR.  Post-Op Diagnosis: Same Surgeons:Primary: Bjorn Pippin, MD Assistants:Caroline McBane PA-C Location: [facility] OR ROOM 5 Anesthesia: General with Exparel interscalene block Antibiotics: Ancef 2 g Tourniquet time: None Estimated Blood Loss: Minimal Complications: None Specimens: None Implants: Implant Name Type Inv. Item Serial No. Manufacturer Lot No. LRB No. Used Action  ANCHOR SUT FBRTK 2.6 SP #5 - [id number] Anchor ANCHOR SUT FBRTK 2.6 SP #5  [employer] [id number] Left 1 Implanted  ANCHOR SUT FBRTK 2.6 SP #5 - [id number] Anchor ANCHOR SUT FBRTK 2.6 SP #5  [employer] [id number] Left 1 Implanted  ANCHOR SUT 1.8  FIBERTAK SB KL - [id number] Anchor ANCHOR SUT 1.8 FIBERTAK SB KL  [employer] [id number] Left 1 Implanted  ANCHOR SUT 1.8 FIBERTAK SB KL - [id number] Anchor ANCHOR SUT 1.8 [employer] [id number] Left 1 Implanted    Indications for Surgery:   Jerme Arrison is a 67 y.o. male with shoulder instability failing non-operative management and at risk of continued instability.  We discussed options including continued rehab versus surgery.  Family and patient understand the nature of postop recovery.  The risks and benefits were explained at length including but not limited to continued pain, cuff failure, continued instability, pain, hardware malfunction, infection and stiffness were all discussed.   Procedure:   Patient was correctly identified in the preoperative holding area and operative site marked.  Patient brought to OR and positioned lateral on a beanbag with an arthrex lateral positioner.  Anesthesia was induced and the operative shoulder was prepped and draped in the usual sterile fashion.  Timeout was called preincision.  We began by making our portals including an anterior inferior portal just above the subscap by placing a 6 spinal needle then switching stick and placing a cannula.  We made an anterior accessory portal just above this just below the biceps.  Once these were performed formed we switched the camera to the anterior portal and were able to place a posterior superior and posterior inferior portal.    Once the cannulas were placed we felt that it was appropriate to start mobilizing the fracture fragments.  They were quite medialized and comminuted.  We noted that the anterior inferior glenoid fracture involved about a third of the glenoid face.  It was not appropriate for  screw fixation.  We used a K wire to perforate the larger fracture fragments and placed two 2.6 fiber tack anchors at the articular fracture border.  We then were able to pass the sutures and imbricate  and repair perform an open external fixation of the glenoid fracture.  Once we had good fixation and appropriate anatomic reduction we used 1.8 fiber tack anchors and shuttled sutures in repaired the labrum as well as performed a capsulorrhaphy.  Extensive debridement was performed of glenoid bone, glenoid cartilage, humeral bone, humeral cartilage, glenoid labral tissue, rotator interval amongst others.  There is a same amount of bony debris within the joint.  The incisions were closed with absorbable monocryl and steri strips.  A sterile dressing was placed along with a sling. The patient was awoken from general anesthesia and taken to the PACU in stable condition without complication.   Alfonse Alpers, PA-C, present and scrubbed throughout the case, critical for completion in a timely fashion, and for retraction, instrumentation, closure.

## 2022-06-13 DIAGNOSIS — H04413 Chronic dacryocystitis of bilateral lacrimal passages: Secondary | ICD-10-CM | POA: Diagnosis not present

## 2022-06-13 DIAGNOSIS — H0102A Squamous blepharitis right eye, upper and lower eyelids: Secondary | ICD-10-CM | POA: Diagnosis not present

## 2022-06-13 DIAGNOSIS — H0102B Squamous blepharitis left eye, upper and lower eyelids: Secondary | ICD-10-CM | POA: Diagnosis not present

## 2022-06-13 DIAGNOSIS — H10413 Chronic giant papillary conjunctivitis, bilateral: Secondary | ICD-10-CM | POA: Diagnosis not present

## 2022-06-13 DIAGNOSIS — H04203 Unspecified epiphora, bilateral lacrimal glands: Secondary | ICD-10-CM | POA: Diagnosis not present

## 2022-07-01 ENCOUNTER — Other Ambulatory Visit: Payer: Self-pay | Admitting: Family Medicine

## 2022-07-01 DIAGNOSIS — E785 Hyperlipidemia, unspecified: Secondary | ICD-10-CM

## 2022-07-04 IMAGING — CT CT ABDOMEN W/O CM
1 of 2 series · 14 of 32 positions shown, 19 images · non-contrast
Comparison: January 05, 2019.

CLINICAL DATA: Incisional hernia.

EXAM:
CT ABDOMEN WITHOUT CONTRAST
TECHNIQUE: Multidetector CT imaging of the abdomen was performed following the
standard protocol without IV contrast.

[Series 2: abd w/(date) · axial · 0.89mm/px · z∈[-292,-52]mm · 14 of 56 slices shown, 19 images]
[im 4/56  soft-tissue]
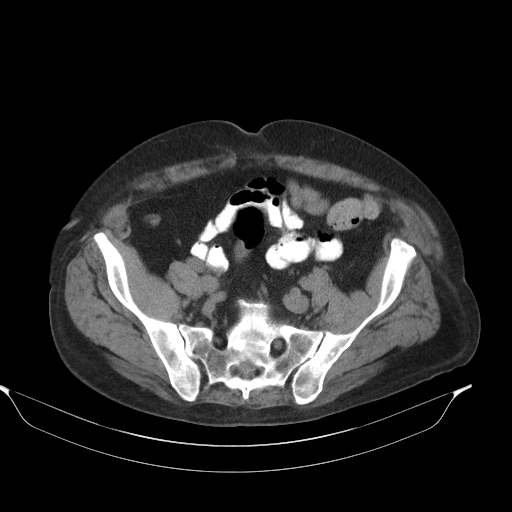
[im 4/56  bone]
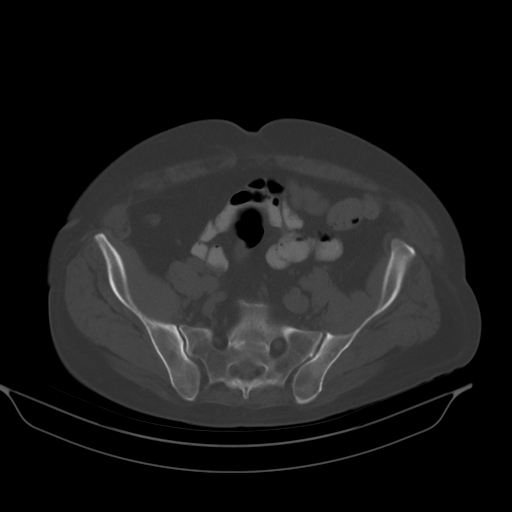
[im 7/56  soft-tissue]
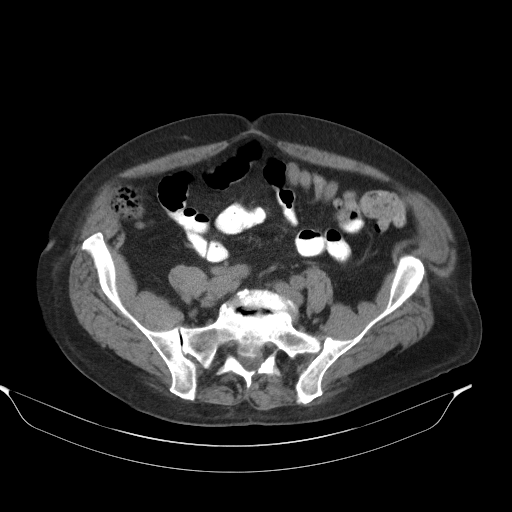
[im 11/56  soft-tissue]
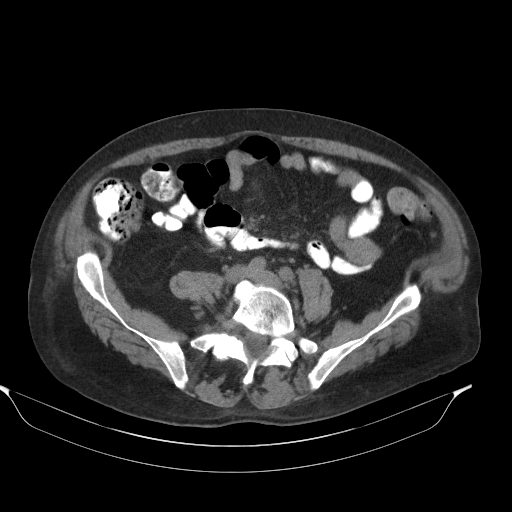
[im 18/56  soft-tissue]
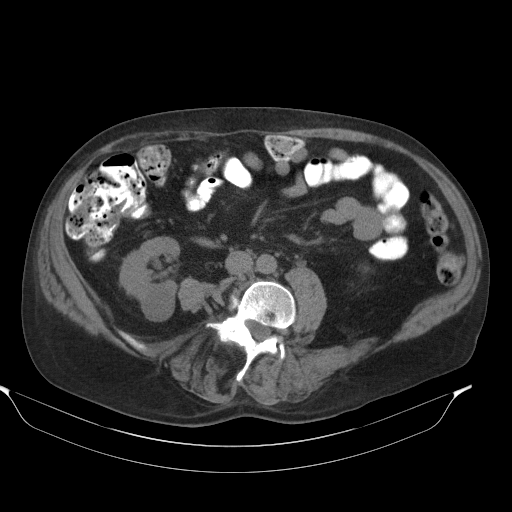
[im 21/56  soft-tissue]
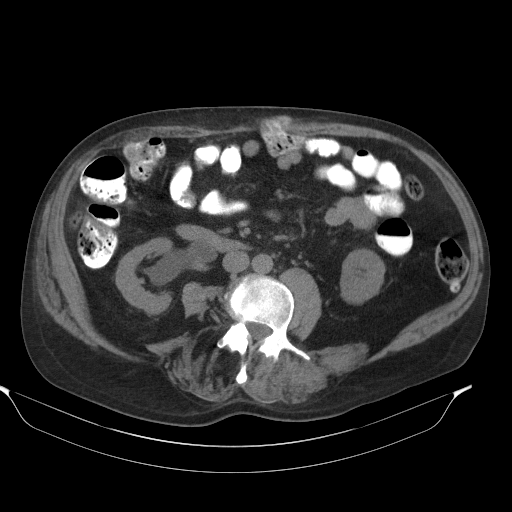
[im 25/56  soft-tissue]
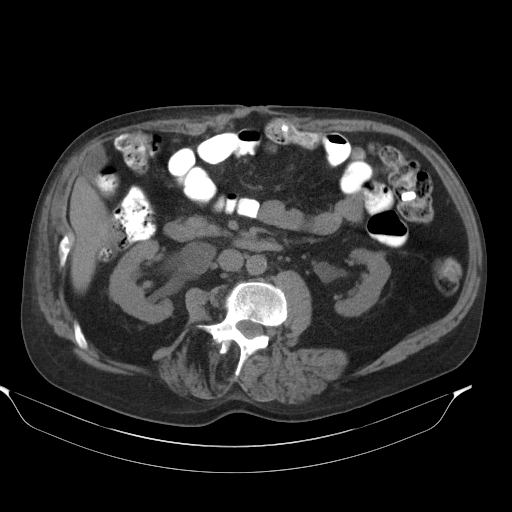
[im 28/56  soft-tissue]
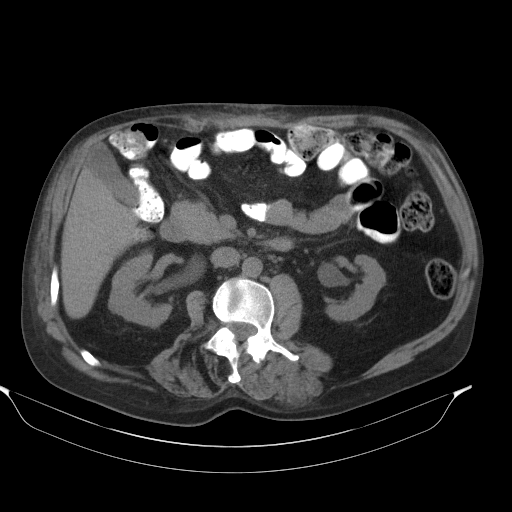
[im 31/56  soft-tissue]
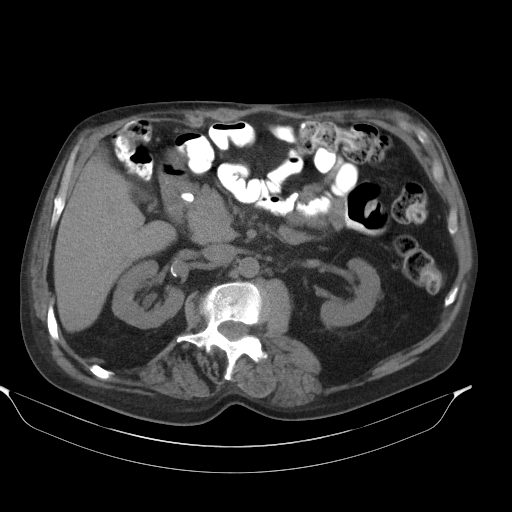
[im 35/56  soft-tissue]
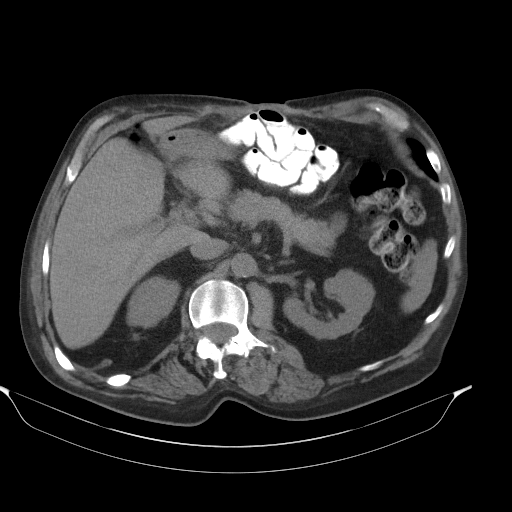
[im 35/56  bone]
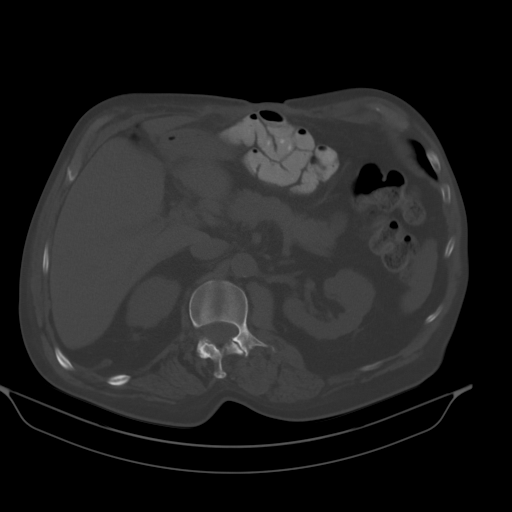
[im 38/56  soft-tissue]
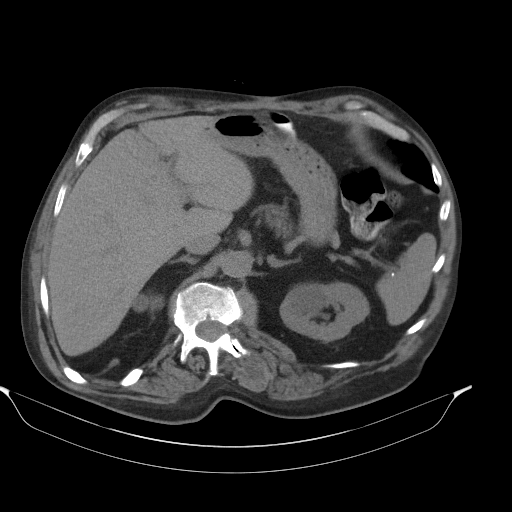
[im 42/56  lung]
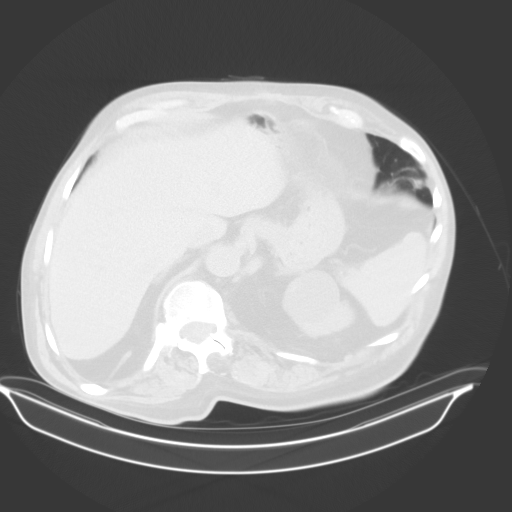
[im 45/56  soft-tissue]
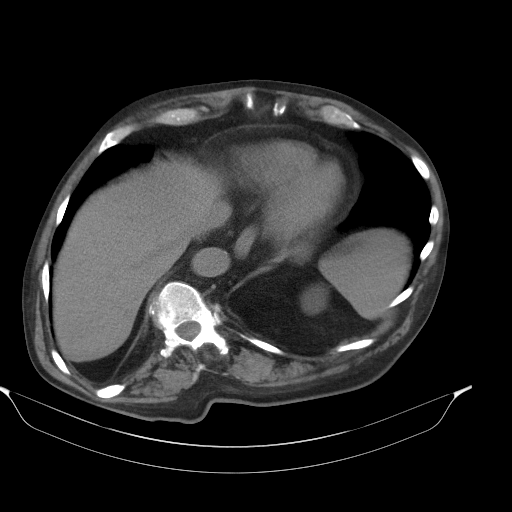
[im 45/56  lung]
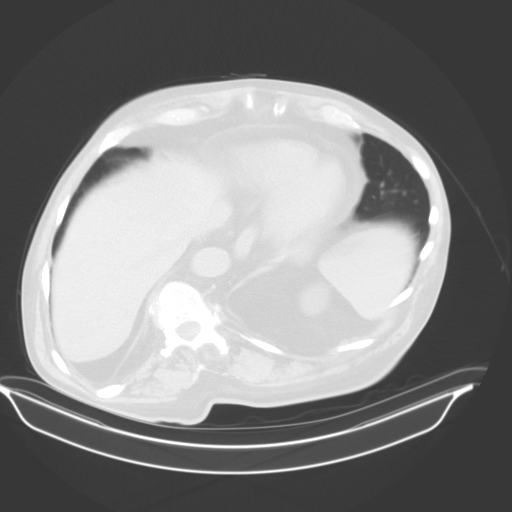
[im 49/56  soft-tissue]
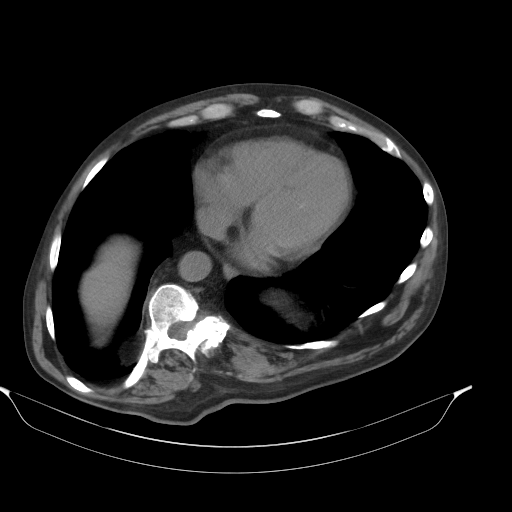
[im 49/56  lung]
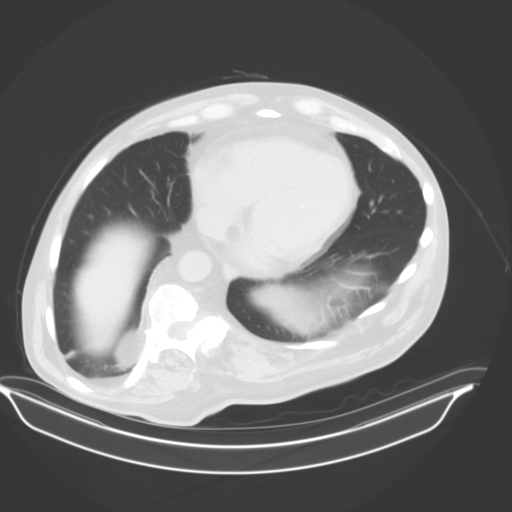
[im 52/56  soft-tissue]
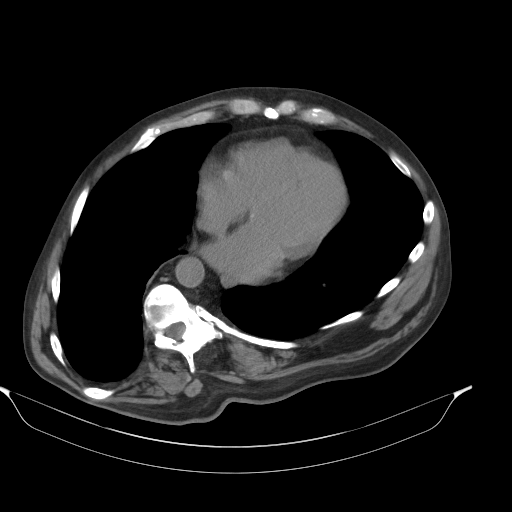
[im 52/56  lung]
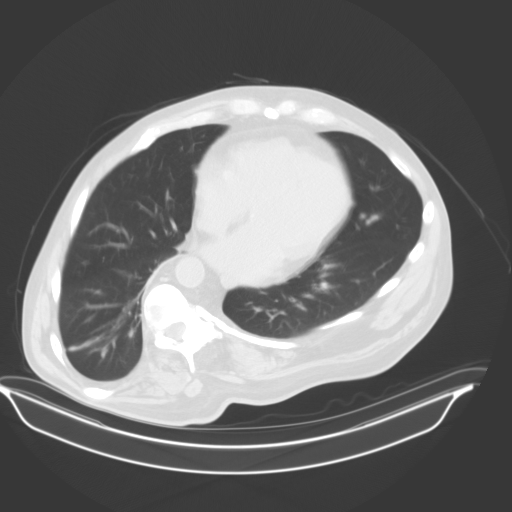

[14 of 32 positions shown; findings below may reference images not displayed]

FINDINGS: Lower chest: No acute abnormality.

Hepatobiliary: No focal liver abnormality is seen. No gallstones,
gallbladder wall thickening, or biliary dilatation.

Pancreas: Unremarkable. No pancreatic ductal dilatation or
surrounding inflammatory changes.

Spleen: Normal in size without focal abnormality.

Adrenals/Urinary Tract: Adrenal glands appear normal. Stable
bilateral renal cysts are noted. No renal or ureteral calculi are
noted. Moderate right hydronephrosis is noted without obstructing
calculus or dilatation of the visualized proximal ureter, suggesting
ureteropelvic junction stenosis.

Stomach/Bowel: Stomach is within normal limits. Appendix appears
normal. No evidence of bowel wall thickening, distention, or
inflammatory changes.

Vascular/Lymphatic: No significant vascular findings are present. No
enlarged abdominal or pelvic lymph nodes.

Other: No abdominal wall hernia or abnormality.

Musculoskeletal: No acute or significant osseous findings.
IMPRESSION: 1. Stable bilateral renal cysts.
2. Moderate right hydronephrosis is noted without obstructing
calculus or dilatation of the visualized proximal ureter, suggesting
ureteropelvic junction stenosis.
3. No other significant abnormality seen in the abdomen.

## 2022-07-06 ENCOUNTER — Telehealth: Payer: Self-pay | Admitting: Family Medicine

## 2022-07-06 NOTE — Telephone Encounter (Signed)
[facility] - Order   **Charge attached and placed in front bin

## 2022-07-09 NOTE — Telephone Encounter (Signed)
Placed in sign folder, at back

## 2022-07-13 DIAGNOSIS — M24112 Other articular cartilage disorders, left shoulder: Secondary | ICD-10-CM | POA: Diagnosis not present

## 2022-07-16 NOTE — Telephone Encounter (Signed)
Received order from [facility] oxygen regarding oxygen treatment.  Face-to-face office visit required with discussion of oxygen usage, we have not completed that.  On chart review it appears oxygen treatment has been discussed with Dr. Vassie Loll in 2023 and based on overnight oximetry oxygen was discontinued in March 2023.  Recommend this request go through his pulmonologist, and clarify if he is using oxygen.  Let me know if I can help further.

## 2022-07-17 NOTE — Telephone Encounter (Signed)
Will fax back with indication to reach out to pulmonologist

## 2022-07-18 DIAGNOSIS — M24112 Other articular cartilage disorders, left shoulder: Secondary | ICD-10-CM | POA: Diagnosis not present

## 2022-07-18 DIAGNOSIS — M25612 Stiffness of left shoulder, not elsewhere classified: Secondary | ICD-10-CM | POA: Diagnosis not present

## 2022-07-18 DIAGNOSIS — S43005D Unspecified dislocation of left shoulder joint, subsequent encounter: Secondary | ICD-10-CM | POA: Diagnosis not present

## 2022-07-18 DIAGNOSIS — M6281 Muscle weakness (generalized): Secondary | ICD-10-CM | POA: Diagnosis not present

## 2022-07-18 DIAGNOSIS — S42102D Fracture of unspecified part of scapula, left shoulder, subsequent encounter for fracture with routine healing: Secondary | ICD-10-CM | POA: Diagnosis not present

## 2022-07-23 DIAGNOSIS — S42102D Fracture of unspecified part of scapula, left shoulder, subsequent encounter for fracture with routine healing: Secondary | ICD-10-CM | POA: Diagnosis not present

## 2022-07-23 DIAGNOSIS — M24112 Other articular cartilage disorders, left shoulder: Secondary | ICD-10-CM | POA: Diagnosis not present

## 2022-07-23 DIAGNOSIS — M25612 Stiffness of left shoulder, not elsewhere classified: Secondary | ICD-10-CM | POA: Diagnosis not present

## 2022-07-23 DIAGNOSIS — M6281 Muscle weakness (generalized): Secondary | ICD-10-CM | POA: Diagnosis not present

## 2022-07-23 DIAGNOSIS — S43005D Unspecified dislocation of left shoulder joint, subsequent encounter: Secondary | ICD-10-CM | POA: Diagnosis not present

## 2022-07-25 DIAGNOSIS — M6281 Muscle weakness (generalized): Secondary | ICD-10-CM | POA: Diagnosis not present

## 2022-07-25 DIAGNOSIS — S42102D Fracture of unspecified part of scapula, left shoulder, subsequent encounter for fracture with routine healing: Secondary | ICD-10-CM | POA: Diagnosis not present

## 2022-07-25 DIAGNOSIS — S43005D Unspecified dislocation of left shoulder joint, subsequent encounter: Secondary | ICD-10-CM | POA: Diagnosis not present

## 2022-07-25 DIAGNOSIS — M25612 Stiffness of left shoulder, not elsewhere classified: Secondary | ICD-10-CM | POA: Diagnosis not present

## 2022-07-25 DIAGNOSIS — M24112 Other articular cartilage disorders, left shoulder: Secondary | ICD-10-CM | POA: Diagnosis not present

## 2022-07-26 DIAGNOSIS — M546 Pain in thoracic spine: Secondary | ICD-10-CM | POA: Diagnosis not present

## 2022-07-30 DIAGNOSIS — M25612 Stiffness of left shoulder, not elsewhere classified: Secondary | ICD-10-CM | POA: Diagnosis not present

## 2022-07-30 DIAGNOSIS — M24112 Other articular cartilage disorders, left shoulder: Secondary | ICD-10-CM | POA: Diagnosis not present

## 2022-07-30 DIAGNOSIS — M6281 Muscle weakness (generalized): Secondary | ICD-10-CM | POA: Diagnosis not present

## 2022-07-30 DIAGNOSIS — S43005D Unspecified dislocation of left shoulder joint, subsequent encounter: Secondary | ICD-10-CM | POA: Diagnosis not present

## 2022-07-30 DIAGNOSIS — S42102D Fracture of unspecified part of scapula, left shoulder, subsequent encounter for fracture with routine healing: Secondary | ICD-10-CM | POA: Diagnosis not present

## 2022-08-01 ENCOUNTER — Encounter (INDEPENDENT_AMBULATORY_CARE_PROVIDER_SITE_OTHER): Payer: Self-pay

## 2022-08-01 DIAGNOSIS — H04123 Dry eye syndrome of bilateral lacrimal glands: Secondary | ICD-10-CM | POA: Diagnosis not present

## 2022-08-01 DIAGNOSIS — H02834 Dermatochalasis of left upper eyelid: Secondary | ICD-10-CM | POA: Diagnosis not present

## 2022-08-01 DIAGNOSIS — H2513 Age-related nuclear cataract, bilateral: Secondary | ICD-10-CM | POA: Diagnosis not present

## 2022-08-01 DIAGNOSIS — H57813 Brow ptosis, bilateral: Secondary | ICD-10-CM | POA: Diagnosis not present

## 2022-08-01 DIAGNOSIS — H10413 Chronic giant papillary conjunctivitis, bilateral: Secondary | ICD-10-CM | POA: Diagnosis not present

## 2022-08-01 DIAGNOSIS — M6281 Muscle weakness (generalized): Secondary | ICD-10-CM | POA: Diagnosis not present

## 2022-08-01 DIAGNOSIS — H43812 Vitreous degeneration, left eye: Secondary | ICD-10-CM | POA: Diagnosis not present

## 2022-08-01 DIAGNOSIS — D492 Neoplasm of unspecified behavior of bone, soft tissue, and skin: Secondary | ICD-10-CM | POA: Diagnosis not present

## 2022-08-01 DIAGNOSIS — S43005D Unspecified dislocation of left shoulder joint, subsequent encounter: Secondary | ICD-10-CM | POA: Diagnosis not present

## 2022-08-01 DIAGNOSIS — M24112 Other articular cartilage disorders, left shoulder: Secondary | ICD-10-CM | POA: Diagnosis not present

## 2022-08-01 DIAGNOSIS — H0102B Squamous blepharitis left eye, upper and lower eyelids: Secondary | ICD-10-CM | POA: Diagnosis not present

## 2022-08-01 DIAGNOSIS — M25612 Stiffness of left shoulder, not elsewhere classified: Secondary | ICD-10-CM | POA: Diagnosis not present

## 2022-08-01 DIAGNOSIS — H0102A Squamous blepharitis right eye, upper and lower eyelids: Secondary | ICD-10-CM | POA: Diagnosis not present

## 2022-08-01 DIAGNOSIS — H02831 Dermatochalasis of right upper eyelid: Secondary | ICD-10-CM | POA: Diagnosis not present

## 2022-08-01 DIAGNOSIS — S42102D Fracture of unspecified part of scapula, left shoulder, subsequent encounter for fracture with routine healing: Secondary | ICD-10-CM | POA: Diagnosis not present

## 2022-08-03 ENCOUNTER — Encounter: Payer: Self-pay | Admitting: Family Medicine

## 2022-08-03 DIAGNOSIS — G629 Polyneuropathy, unspecified: Secondary | ICD-10-CM

## 2022-08-03 DIAGNOSIS — E538 Deficiency of other specified B group vitamins: Secondary | ICD-10-CM

## 2022-08-03 MED ORDER — GABAPENTIN 300 MG PO CAPS
ORAL_CAPSULE | ORAL | 1 refills | Status: AC
Start: 2022-08-03 — End: ?

## 2022-08-03 NOTE — Telephone Encounter (Signed)
Gabapentin 300 refilled 08/03/2022 Last office visit 02/13/2022 Last refill 02/13/2022

## 2022-08-06 DIAGNOSIS — M6281 Muscle weakness (generalized): Secondary | ICD-10-CM | POA: Diagnosis not present

## 2022-08-06 DIAGNOSIS — M24112 Other articular cartilage disorders, left shoulder: Secondary | ICD-10-CM | POA: Diagnosis not present

## 2022-08-06 DIAGNOSIS — S43005D Unspecified dislocation of left shoulder joint, subsequent encounter: Secondary | ICD-10-CM | POA: Diagnosis not present

## 2022-08-06 DIAGNOSIS — M25612 Stiffness of left shoulder, not elsewhere classified: Secondary | ICD-10-CM | POA: Diagnosis not present

## 2022-08-06 DIAGNOSIS — S42102D Fracture of unspecified part of scapula, left shoulder, subsequent encounter for fracture with routine healing: Secondary | ICD-10-CM | POA: Diagnosis not present

## 2022-08-08 DIAGNOSIS — M25612 Stiffness of left shoulder, not elsewhere classified: Secondary | ICD-10-CM | POA: Diagnosis not present

## 2022-08-08 DIAGNOSIS — M24112 Other articular cartilage disorders, left shoulder: Secondary | ICD-10-CM | POA: Diagnosis not present

## 2022-08-08 DIAGNOSIS — M6281 Muscle weakness (generalized): Secondary | ICD-10-CM | POA: Diagnosis not present

## 2022-08-08 DIAGNOSIS — S43005D Unspecified dislocation of left shoulder joint, subsequent encounter: Secondary | ICD-10-CM | POA: Diagnosis not present

## 2022-08-08 DIAGNOSIS — S42102D Fracture of unspecified part of scapula, left shoulder, subsequent encounter for fracture with routine healing: Secondary | ICD-10-CM | POA: Diagnosis not present

## 2022-08-13 DIAGNOSIS — M25612 Stiffness of left shoulder, not elsewhere classified: Secondary | ICD-10-CM | POA: Diagnosis not present

## 2022-08-13 DIAGNOSIS — M6281 Muscle weakness (generalized): Secondary | ICD-10-CM | POA: Diagnosis not present

## 2022-08-13 DIAGNOSIS — S43005D Unspecified dislocation of left shoulder joint, subsequent encounter: Secondary | ICD-10-CM | POA: Diagnosis not present

## 2022-08-13 DIAGNOSIS — S42102D Fracture of unspecified part of scapula, left shoulder, subsequent encounter for fracture with routine healing: Secondary | ICD-10-CM | POA: Diagnosis not present

## 2022-08-13 DIAGNOSIS — M24112 Other articular cartilage disorders, left shoulder: Secondary | ICD-10-CM | POA: Diagnosis not present

## 2022-08-15 ENCOUNTER — Encounter: Payer: Self-pay | Admitting: Family Medicine

## 2022-08-15 ENCOUNTER — Ambulatory Visit (INDEPENDENT_AMBULATORY_CARE_PROVIDER_SITE_OTHER): Payer: PPO | Admitting: Family Medicine

## 2022-08-15 VITALS — BP 118/68 | HR 58 | Temp 98.9°F | Ht 68.5 in | Wt 197.2 lb

## 2022-08-15 DIAGNOSIS — Z0000 Encounter for general adult medical examination without abnormal findings: Secondary | ICD-10-CM

## 2022-08-15 DIAGNOSIS — S43005D Unspecified dislocation of left shoulder joint, subsequent encounter: Secondary | ICD-10-CM | POA: Diagnosis not present

## 2022-08-15 DIAGNOSIS — Z131 Encounter for screening for diabetes mellitus: Secondary | ICD-10-CM

## 2022-08-15 DIAGNOSIS — M6281 Muscle weakness (generalized): Secondary | ICD-10-CM | POA: Diagnosis not present

## 2022-08-15 DIAGNOSIS — I10 Essential (primary) hypertension: Secondary | ICD-10-CM | POA: Diagnosis not present

## 2022-08-15 DIAGNOSIS — E538 Deficiency of other specified B group vitamins: Secondary | ICD-10-CM | POA: Diagnosis not present

## 2022-08-15 DIAGNOSIS — M4124 Other idiopathic scoliosis, thoracic region: Secondary | ICD-10-CM | POA: Diagnosis not present

## 2022-08-15 DIAGNOSIS — E785 Hyperlipidemia, unspecified: Secondary | ICD-10-CM | POA: Diagnosis not present

## 2022-08-15 DIAGNOSIS — R972 Elevated prostate specific antigen [PSA]: Secondary | ICD-10-CM

## 2022-08-15 DIAGNOSIS — S42102D Fracture of unspecified part of scapula, left shoulder, subsequent encounter for fracture with routine healing: Secondary | ICD-10-CM | POA: Diagnosis not present

## 2022-08-15 DIAGNOSIS — M24112 Other articular cartilage disorders, left shoulder: Secondary | ICD-10-CM | POA: Diagnosis not present

## 2022-08-15 DIAGNOSIS — M25612 Stiffness of left shoulder, not elsewhere classified: Secondary | ICD-10-CM | POA: Diagnosis not present

## 2022-08-15 MED ORDER — AMLODIPINE BESYLATE 10 MG PO TABS: ORAL_TABLET | ORAL | 2 refills | Status: DC

## 2022-08-15 MED ORDER — LOSARTAN POTASSIUM-HCTZ 100-12.5 MG PO TABS: 1.0000 | ORAL_TABLET | Freq: Every day | ORAL | 2 refills | Status: DC

## 2022-08-15 NOTE — Progress Notes (Unsigned)
Subjective:  [name], male    DOB: 08/14/1955  Age: 67 y.o. MRN: [id number]  CC:  Chief Complaint  Patient presents with   Annual Exam    Pt is note fasting, doing well     HPI [name] presents for Annual Exam.  Doing well, no acute concerns.   PCP, me Ophthalmology, Dr. Ernesto Rutherford, blepharitis. On pataday, lumify, cleansing pad for eyelid.  Ortho, Dr. Everardo Pacific, arthroscopy with Bankart repair in May - recovering well with PT.  Ortho Dr. Roda Shutters, lumbar radiculopathy, right hip pain - better.  Cardiology, Dr. Tresa Endo.  Hypertension, bradycardia previously. Pulmonary Dr. Vassie Loll, sleep apnea.   Peripheral neuropathy: Improving for feet. Gabapentin 600mg  in am, some additional 1-2 at times with increased activity and with recent shoulder issues. Prior B12 deficiency, no current supplement - MVI every day only.   Hypertension: metoprolol 50 mg daily, losartan hct 100/12.5mg  every day. amlodipine 10 mg daily - low HR in past - asx.  on OSA for CPAP nightly - followed by Dr Vassie Loll. Well rested.  History of thoracic aortic aneurysm.  Thoracic surgery consult in October 2023.  4.1 cm thoracic aortic aneurysm.  Plan for 1 year repeat CTA.  Home readings: none.  BP Readings from Last 3 Encounters:  08/15/22 118/68  05/31/22 118/74  02/13/22 126/72   Lab Results  Component Value Date   CREATININE 0.91 05/29/2022   Hyperlipidemia: Treated with Lipitor 10 mg 2 times per week with hx of aortic aneurysm as above. No myalgias/side effects. Last ate 1 hour ago.  Lab Results  Component Value Date   CHOL 135 02/13/2022   HDL 52.70 02/13/2022   LDLCALC 73 02/13/2022   TRIG 49.0 02/13/2022   CHOLHDL 3 02/13/2022   Lab Results  Component Value Date   ALT 13 02/13/2022   AST 16 02/13/2022   ALKPHOS 67 02/13/2022   BILITOT 0.9 02/13/2022         08/15/2022   10:26 AM 02/13/2022   10:11 AM 08/02/2021    3:03 PM 06/06/2021    9:53 AM 02/28/2021    1:35 PM   Depression screen PHQ 2/9  Decreased Interest 0 0 0 0 0  Down, Depressed, Hopeless 0 0 0 0 0  PHQ - 2 Score 0 0 0 0 0  Altered sleeping 0  0  0  Tired, decreased energy 0  0  0  Change in appetite 0  0  0  Feeling bad or failure about yourself  0  0  0  Trouble concentrating 0  0  0  Moving slowly or fidgety/restless 0  0  0  Suicidal thoughts 0  0  0  PHQ-9 Score 0  0  0  Difficult doing work/chores Not difficult at all  Not difficult at all      Health Maintenance  Topic Date Due   COVID-19 Vaccine (8 - 2023-24 season) 06/13/2022   Pneumonia Vaccine 53+ Years old (3 of 3 - PPSV23 or PCV20) 07/10/2022   INFLUENZA VACCINE  08/02/2022   Colonoscopy  04/29/2024   DTaP/Tdap/Td (3 - Td or Tdap) 11/19/2026   Hepatitis C Screening  Completed   HIV Screening  Completed   Zoster Vaccines- Shingrix  Completed   HPV VACCINES  Aged Out  Colonoscopy 04/2019, repeat 5 years.  Prostate: followed by urology with elevated PSA, s/p prostate bx was negative. PSA and urology visit in 12/2021 - repeat in 1 year.  Lab Results  Component Value Date   PSA1 5.9 (H) 02/22/2017   PSA 4.307 10/11/2014   PSA 3.19 10/30/2013   PSA 3.19 10/30/2013      Immunization History  Administered Date(s) Administered   Influenza, High Dose Seasonal PF 09/19/2017   Influenza,inj,Quad PF,6+ Mos 10/08/2014, 09/22/2015, 09/16/2018   Influenza-Unspecified 11/18/2016, 09/19/2019, 11/16/2020, 10/07/2021   PFIZER(Purple Top)SARS-COV-2 Vaccination 01/22/2019, 02/12/2019, 09/29/2019, 04/09/2020, 09/14/2020   Pfizer Covid Bivalent Pediatric Vaccine(1mos to <48yrs) 09/23/2021, 04/18/2022   Pneumococcal Conjugate-13 07/09/2017   Pneumococcal Polysaccharide-23 02/14/2008   Respiratory Syncytial Virus Vaccine,Recomb Aduvanted(Arexvy) 09/12/2021   Td 01/01/2006   Tdap 11/18/2016   Zoster Recombinant(Shingrix) 12/22/2016, 04/18/2017, 07/14/2020   Zoster, Live 06/06/2011    No results found. Saw optho few weeks ago -  no acute issues, early cataract, 2 year follow up.   Dental:every 6 months. Appt tomorrow.   Alcohol:none  Tobacco: none  Exercise: pilates with legs 3 times per week, in PT for shoulder. Plans to increase when cleared. Chronic scoliosis. No new sx's.  Working with [employer] prior with some specific exercises for scoliosis- Schroth exercises.  No focal bone pain or back pain. No recent fall. No scoliosis specialist.  Height: 07/14/20 - 5'11" 08/02/21 - 5'11" Today 5'8.5" Shoes off today - unsure if off for prior testing.     History Patient Active Problem List   Diagnosis Date Noted   Thoracic aortic aneurysm without rupture (HCC) 10/10/2021   Dermatochalasis 02/10/2021   Ectropion 02/10/2021   Claw toe, acquired, left    Bunion of great toe of left foot    Skin lesion of face 06/14/2020   Unilateral primary osteoarthritis, right knee 01/06/2020   S/P hernia repair 10/15/2019   Neuropathy 07/20/2019   Macrocytic anemia    Debility 07/07/2019   Atelectasis    Enterococcal bacteremia 06/26/2019   Perforated viscus 01/06/2019   Onychomycosis 05/19/2018   Foot fracture, left, closed, initial encounter 05/09/2018   Lisfranc dislocation, left, initial encounter    Primary pulmonary hypertension (HCC) 07/09/2017   Diastolic dysfunction 07/08/2017   OSA (obstructive sleep apnea) 07/08/2017   SOB (shortness of breath) 07/02/2017   Chronic respiratory failure with hypoxia and hypercapnia (HCC) 07/02/2017   Cardiomegaly 07/02/2017   Exposure keratitis 02/17/2017   Pedal edema 12/18/2016   Transaminitis 12/18/2016   Bilateral pes planus 12/18/2016   Psoriasis-eczema overlap condition 07/30/2016   High serum high density lipoprotein (HDL) 09/26/2015   Epistaxis 09/26/2015   Renal artery stenosis in 1 of 2 vessels (HCC)    Essential tremor    Elevated PSA    Obesity, Class I, BMI 30-34.9    Mild intermittent asthma in adult without complication    Seasonal allergies    HTN  (hypertension)    Scoliosis    Past Medical History:  Diagnosis Date   Alcohol use disorder, mild, abuse    Anemia    Anxiety    situational   Arthritis    Phreesia 02/21/2020   Asthma    Phreesia 02/21/2020   BPH (benign prostatic hyperplasia)    on flomax- Patien tand wife denies- and patient has never been on Flomax   Bundle branch block    per prior PCP records   CHF (congestive heart failure) (HCC)    Chronic kidney disease    Acute Renal Failure- June 2021- admission- kidneys recovered quickly   Complication of anesthesia 03/11/2019   after sx, woke up, but was paralyzed   COPD (chronic obstructive  pulmonary disease) (HCC)    Diverticulosis    by colonoscopy   Eczema    per prior PCP records   Elevated PSA 2015   peaked 6s, s/p benign biopsy 2014, sees urology [facility])   Essential tremor    GERD (gastroesophageal reflux disease)    per prior PCP records   History of panic attacks    per prior PCP records   HTN (hypertension)    Hypertension    Phreesia 02/21/2020   Mild intermittent asthma in adult without complication    Obesity, Class I, BMI 30-34.9    Osteoporosis    DEXA 06/2013 with osteopenia - h/o ?wrist/hip fracture from AVN from chronic steroid use (asthma), took reclast for 1 year   Pneumonia    10/08/19 >10 years ago   Seasonal allergies    Sleep apnea    Substance abuse (HCC)    Phreesia 02/21/2020   Thoracic aortic aneurysm [facility])    Thoracic scoliosis childhood   Transaminitis    per prior PCP records   Vitamin B 12 deficiency    Past Surgical History:  Procedure Laterality Date   APPLICATION OF WOUND VAC Left 05/09/2018   Procedure: Application Of Wound Vac;  Surgeon: Nadara Mustard, MD;  Location[facility];  Service: Orthopedics;  Laterality: Left;   BIOPSY  04/30/2019   Procedure: BIOPSY;  Surgeon: Charna Elizabeth, MD;  Location: [facility];  Service: Endoscopy;;   BROW LIFT Bilateral 04/12/2021   Procedure: UPPER LID BLEPHAROPLASTY;   Surgeon: Peggye Form, DO;  Location: [facility];  Service: Plastics;  Laterality: Bilateral;   COLONOSCOPY  12/2013   polyps, int hem, diverticulosis, rpt 5 yrs [address])   COLONOSCOPY WITH PROPOFOL N/A 04/30/2019   Procedure: COLONOSCOPY WITH PROPOFOL;  Surgeon: Charna Elizabeth, MD;  Location: [facility];  Service: Endoscopy;  Laterality: N/A;   ELBOW SURGERY Right 2008   golfer's elbow   ESOPHAGOGASTRODUODENOSCOPY (EGD) WITH PROPOFOL N/A 04/30/2019   Procedure: ESOPHAGOGASTRODUODENOSCOPY (EGD) WITH PROPOFOL;  Surgeon: Charna Elizabeth, MD;  Location: [facility];  Service: Endoscopy;  Laterality: N/A;   HERNIA REPAIR N/A    Phreesia 02/21/2020   INCISIONAL HERNIA REPAIR N/A 10/15/2019   Procedure: HERNIA REPAIR INCISIONAL WITH MESH, TAR;  Surgeon: Axel Filler, MD;  Location: [facility] OR;  Service: General;  Laterality: N/A;   INGUINAL HERNIA REPAIR Bilateral childhood & ~1995   LAPAROTOMY N/A 01/05/2019   Procedure: EXPLORATORY LAPAROTOMY graham patch repair;  Surgeon: Sung Amabile, DO;  Location: [facility] ORS;  Service: General;  Laterality: N/A;   LAPAROTOMY N/A 10/15/2019   Procedure: EXPLORATORY LAPAROTOMY;  Surgeon: Axel Filler, MD;  Location: [facility] OR;  Service: General;  Laterality: N/A;   LYSIS OF ADHESION N/A 10/15/2019   Procedure: LYSIS OF ADHESION;  Surgeon: Axel Filler, MD;  Location: [facility] OR;  Service: General;  Laterality: N/A;   NASAL SEPTUM SURGERY  2013   deviated septum   ORIF ANKLE FRACTURE Left 05/09/2018   Procedure: OPEN REDUCTION INTERNAL FIXATION LEFT LISFRANC FRACTURE/DISLOCATION;  Surgeon: Nadara Mustard, MD;  Location: [facility] OR;  Service: Orthopedics;  Laterality: Left;   POLYPECTOMY  04/30/2019   Procedure: POLYPECTOMY;  Surgeon: Charna Elizabeth, MD;  Location: [facility];  Service: Endoscopy;;   SHOULDER ARTHROSCOPY Left 05/31/2022   Procedure: ARTHROSCOPY SHOULDER DEBRIDEMENT;  Surgeon: Bjorn Pippin, MD;  Location: [facility];  Service:  Orthopedics;  Laterality: Left;   SHOULDER ARTHROSCOPY WITH BANKART REPAIR Left 05/31/2022   Procedure:  SHOULDER ARTHROSCOPY WITH BANKART REPAIR;  Surgeon: Bjorn Pippin, MD;  Location: [facility];  Service: Orthopedics;  Laterality: Left;   TEE WITHOUT CARDIOVERSION N/A 06/29/2019   Procedure: TRANSESOPHAGEAL ECHOCARDIOGRAM (TEE);  Surgeon: Elease Hashimoto Deloris Ping, MD;  Location: [facility];  Service: Cardiovascular;  Laterality: N/A;   US ECHOCARDIOGRAPHY  02/2009   WNL, EF >55% Tresa Endo)   US RENAL/AORTA Right 05/2009   1-59% diameter reduction renal artery, rec rpt 2 yrs   VASECTOMY N/A    Phreesia 02/21/2020   WEIL OSTEOTOMY Left 11/16/2020   Procedure: WEIL OSTEOTOMY LEFT 2ND METATARSAL,  2ND TOE DISTAL INTERPHALANGEAL RESECTION AND Quintella Reichert OSTEOTOMY WEIL OSTEOTOMY THIRD METATARSAL;  Surgeon: Nadara Mustard, MD;  Location: [facility] OR;  Service: Orthopedics;  Laterality: Left;   Allergies  Allergen Reactions   Accupril [Quinapril Hcl] Cough   Nsaids     Perforated gastric ulcer   Quinolones Other (See Comments)    Hx of thoracic aortic aneurysm. Avoid quinolones.    Prior to Admission medications   Medication Sig Start Date End Date Taking? Authorizing Provider  albuterol (VENTOLIN HFA) 108 (90 Base) MCG/ACT inhaler Inhale 2 puffs into the lungs every 6 (six) hours as needed for wheezing or shortness of breath. 02/23/22  Yes Sheliah Hatch, MD  amLODipine (NORVASC) 10 MG tablet Take one tablet daily 02/13/22  Yes Shade Flood, MD  atorvastatin (LIPITOR) 10 MG tablet TAKE 1 TABLET BY MOUTH 2 TO 3 DAYS PER WEEK 07/02/22  Yes Shade Flood, MD  diclofenac Sodium (VOLTAREN) 1 % GEL Apply 1 application topically 4 (four) times daily as needed (knee pain).   Yes [provider]  fluticasone (FLONASE) 50 MCG/ACT nasal spray Place into both nostrils daily.   Yes [provider]  gabapentin (NEURONTIN) 300 MG capsule TAKE 1 CAPSULE(300 MG) BY MOUTH THREE TIMES DAILY  08/03/22  Yes Shade Flood, MD  losartan-hydrochlorothiazide (HYZAAR) 100-12.5 MG tablet Take 1 tablet by mouth daily. 02/13/22  Yes Shade Flood, MD  metoprolol succinate (TOPROL-XL) 50 MG 24 hr tablet Take with or immediately following a meal. 02/13/22  Yes Shade Flood, MD  Multiple Vitamin (MULTIVITAMIN WITH MINERALS) TABS tablet Take 1 tablet by mouth daily. 07/08/19  Yes Dahal, Melina Schools, MD  olopatadine (PATANOL) 0.1 % ophthalmic solution Place 1 drop into both eyes 2 (two) times daily as needed for allergies.   Yes [provider]  omeprazole (PRILOSEC) 20 MG capsule Take 20 mg by mouth daily.   Yes [provider]  neomycin-polymyxin-hydrocortisone (CORTISPORIN) OTIC solution Place 3 drops into the right ear 3 (three) times daily. For 3-5 days if needed for soreness, itching. Patient not taking: Reported on 08/15/2022 02/13/22   Shade Flood, MD   Social History   Socioeconomic History   Marital status: Married    Spouse name: Not on file   Number of children: Not on file   Years of education: Not on file   Highest education level: Not on file  Occupational History   Not on file  Tobacco Use   Smoking status: Never   Smokeless tobacco: Never  Vaping Use   Vaping status: Never Used  Substance and Sexual Activity   Alcohol use: Not Currently   Drug use: No   Sexual activity: Yes    Birth control/protection: None  Other Topics Concern   Not on file  Social History Narrative   Lives with wife (retired Web designer), 12yo dog died 09/14/14  Grown children   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  Negative other than as indicated above or in HPI.   Objective:   Vitals:   08/15/22 1024  BP: 118/68  Pulse: (!) 58  Temp: 98.9 F (37.2 C)  TempSrc: Temporal  SpO2: 97%  Weight: 197 lb 3.2 oz (89.4 kg)  Height: 5' 8.5" (1.74 m)     Physical Exam Vitals reviewed.  Constitutional:      Appearance: He is well-developed.  HENT:     Head: Normocephalic and atraumatic.     Right Ear: External ear normal.     Left Ear: External ear normal.  Eyes:     Conjunctiva/sclera: Conjunctivae normal.     Pupils: Pupils are equal, round, and reactive to light.  Neck:     Thyroid: No thyromegaly.  Cardiovascular:     Rate and Rhythm: Normal rate and regular rhythm.     Heart sounds: Normal heart sounds.  Pulmonary:     Effort: Pulmonary effort is normal. No respiratory distress.     Breath sounds: Normal breath sounds. No wheezing.  Abdominal:     General: There is no distension.     Palpations: Abdomen is soft.     Tenderness: There is no abdominal tenderness.  Musculoskeletal:        General: No tenderness. Normal range of motion.     Cervical back: Normal range of motion and neck supple.  Lymphadenopathy:     Cervical: No cervical adenopathy.  Skin:    General: Skin is warm and dry.  Neurological:     Mental Status: He is alert and oriented to person, place, and time.     Deep Tendon Reflexes: Reflexes are normal and symmetric.  Psychiatric:        Behavior: Behavior normal.        Assessment & Plan:  Jakiem Scoggins is a 67 y.o. male . Annual physical exam - Plan: Hemoglobin A1c, CBC with Differential/Platelet, Comprehensive metabolic panel, Lipid panel  - -anticipatory guidance as below in AVS, screening labs above. Health maintenance items as above in HPI discussed/recommended as applicable.   Essential hypertension - Plan: CBC with  Differential/Platelet, amLODipine (NORVASC) 10 MG tablet, losartan-hydrochlorothiazide (HYZAAR) 100-12.5 MG tablet  -  Stable, tolerating current regimen. Medications refilled. Labs pending as above.   Hyperlipidemia, unspecified hyperlipidemia type - Plan: Comprehensive metabolic panel, Lipid panel  -Tolerating intermittent dose of statin, continue same.  Check updated labs.  Elevated PSA  -Followed by urology, keep planned follow-up.  Screening for diabetes mellitus - Plan: Hemoglobin A1c, Comprehensive metabolic panel  B12 deficiency - Plan: B12  -On MVI, check B12, continue gabapentin for neuropathic symptoms as above.  Other idiopathic scoliosis, thoracic region  -Decreased height based on testing in office, advised to monitor at home and if notices decrease, would recommend imaging or meeting with spine specialist given history of scoliosis.  Recheck next 3 to 6 months.  RTC precautions  sooner if any new back pain.  Meds ordered this encounter  Medications   amLODipine (NORVASC) 10 MG tablet    Sig: Take one tablet daily    Dispense:  90 tablet    Refill:  2   losartan-hydrochlorothiazide (HYZAAR) 100-12.5 MG tablet    Sig: Take 1 tablet by mouth daily.    Dispense:  90 tablet    Refill:  2   Patient Instructions  Fasting labs in next week if possible: [facility] or xray: Walk in 8:30-4:30 during weekdays, no appointment needed [address]  [place], [place] [address]  No med changes at this time.  Monitor height at home.  If any new back pain I would recommend meeting with spine specialist.  We can monitor for height changes at recheck in the next 3 to 6 months.  Let me know if there are questions sooner.   Preventive Care 40 Years and Older, Male Preventive care refers to lifestyle choices and visits with your health care provider that can promote health and wellness. Preventive care visits are also called wellness exams. What can I expect for my preventive care  visit? Counseling During your preventive care visit, your health care provider may ask about your: Medical history, including: Past medical problems. Family medical history. History of falls. Current health, including: Emotional well-being. Home life and relationship well-being. Sexual activity. Memory and ability to understand (cognition). Lifestyle, including: Alcohol, nicotine or tobacco, and drug use. Access to firearms. Diet, exercise, and sleep habits. Work and work Astronomer. Sunscreen use. Safety issues such as seatbelt and bike helmet use. Physical exam Your health care provider will check your: Height and weight. These may be used to calculate your BMI (body mass index). BMI is a measurement that tells if you are at a healthy weight. Waist circumference. This measures the distance around your waistline. This measurement also tells if you are at a healthy weight and may help predict your risk of certain diseases, such as type 2 diabetes and high blood pressure. Heart rate and blood pressure. Body temperature. Skin for abnormal spots. What immunizations do I need?  Vaccines are usually given at various ages, according to a schedule. Your health care provider will recommend vaccines for you based on your age, medical history, and lifestyle or other factors, such as travel or where you work. What tests do I need? Screening Your health care provider may recommend screening tests for certain conditions. This may include: Lipid and cholesterol levels. Diabetes screening. This is done by checking your blood sugar (glucose) after you have not eaten for a while (fasting). Hepatitis C test. Hepatitis B test. HIV (human immunodeficiency virus) test. STI (sexually transmitted infection) testing, if you are at risk. Lung cancer screening. Colorectal cancer screening. Prostate cancer screening. Abdominal aortic aneurysm (AAA) screening. You may need this if you are a current or  former smoker. Talk with your health care provider about your test results, treatment options, and if necessary, the need for more tests. Follow these instructions at home: Eating and drinking  Eat a diet that includes fresh fruits and vegetables, whole grains, lean protein, and low-fat dairy products. Limit your intake of foods with high amounts of sugar, saturated fats, and salt. Take vitamin and mineral supplements as recommended by your health care provider. Do not drink alcohol if your health care provider tells you not to drink. If you drink alcohol: Limit how much you have to 0-2 drinks a day. Know how much  alcohol is in your drink. In the [place] one drink equals one 12 oz bottle of beer (355 mL), one 5 oz glass of wine (148 mL), or one 1 oz glass of hard liquor (44 mL). Lifestyle Brush your teeth every morning and night with fluoride toothpaste. Floss one time each day. Exercise for at least 30 minutes 5 or more days each week. Do not use any products that contain nicotine or tobacco. These products include cigarettes, chewing tobacco, and vaping devices, such as e-cigarettes. If you need help quitting, ask your health care provider. Do not use drugs. If you are sexually active, practice safe sex. Use a condom or other form of protection to prevent STIs. Take aspirin only as told by your health care provider. Make sure that you understand how much to take and what form to take. Work with your health care provider to find out whether it is safe and beneficial for you to take aspirin daily. Ask your health care provider if you need to take a cholesterol-lowering medicine (statin). Find healthy ways to manage stress, such as: Meditation, yoga, or listening to music. Journaling. Talking to a trusted person. Spending time with friends and family. Safety Always wear your seat belt while driving or riding in a vehicle. Do not drive: If you have been drinking alcohol. Do not ride with  someone who has been drinking. When you are tired or distracted. While texting. If you have been using any mind-altering substances or drugs. Wear a helmet and other protective equipment during sports activities. If you have firearms in your house, make sure you follow all gun safety procedures. Minimize exposure to UV radiation to reduce your risk of skin cancer. What's next? Visit your health care provider once a year for an annual wellness visit. Ask your health care provider how often you should have your eyes and teeth checked. Stay up to date on all vaccines. This information is not intended to replace advice given to you by your health care provider. Make sure you discuss any questions you have with your health care provider. Document Revised: 06/15/2020 Document Reviewed: 06/15/2020 Elsevier Patient Education  2024 Elsevier Inc.       Signed,   Meredith Staggers, MD [facility], [facility] 08/15/22 10:53 AM

## 2022-08-15 NOTE — Patient Instructions (Addendum)
Fasting labs in next week if possible: [facility] or xray: Walk in 8:30-4:30 during weekdays, no appointment needed [address]  [place], [place] [address]  No med changes at this time.  Monitor height at home.  If any new back pain I would recommend meeting with spine specialist.  We can monitor for height changes at recheck in the next 3 to 6 months.  Let me know if there are questions sooner.   Preventive Care 76 Years and Older, Male Preventive care refers to lifestyle choices and visits with your health care provider that can promote health and wellness. Preventive care visits are also called wellness exams. What can I expect for my preventive care visit? Counseling During your preventive care visit, your health care provider may ask about your: Medical history, including: Past medical problems. Family medical history. History of falls. Current health, including: Emotional well-being. Home life and relationship well-being. Sexual activity. Memory and ability to understand (cognition). Lifestyle, including: Alcohol, nicotine or tobacco, and drug use. Access to firearms. Diet, exercise, and sleep habits. Work and work Astronomer. Sunscreen use. Safety issues such as seatbelt and bike helmet use. Physical exam Your health care provider will check your: Height and weight. These may be used to calculate your BMI (body mass index). BMI is a measurement that tells if you are at a healthy weight. Waist circumference. This measures the distance around your waistline. This measurement also tells if you are at a healthy weight and may help predict your risk of certain diseases, such as type 2 diabetes and high blood pressure. Heart rate and blood pressure. Body temperature. Skin for abnormal spots. What immunizations do I need?  Vaccines are usually given at various ages, according to a schedule. Your health care provider will recommend vaccines for you based on your age,  medical history, and lifestyle or other factors, such as travel or where you work. What tests do I need? Screening Your health care provider may recommend screening tests for certain conditions. This may include: Lipid and cholesterol levels. Diabetes screening. This is done by checking your blood sugar (glucose) after you have not eaten for a while (fasting). Hepatitis C test. Hepatitis B test. HIV (human immunodeficiency virus) test. STI (sexually transmitted infection) testing, if you are at risk. Lung cancer screening. Colorectal cancer screening. Prostate cancer screening. Abdominal aortic aneurysm (AAA) screening. You may need this if you are a current or former smoker. Talk with your health care provider about your test results, treatment options, and if necessary, the need for more tests. Follow these instructions at home: Eating and drinking  Eat a diet that includes fresh fruits and vegetables, whole grains, lean protein, and low-fat dairy products. Limit your intake of foods with high amounts of sugar, saturated fats, and salt. Take vitamin and mineral supplements as recommended by your health care provider. Do not drink alcohol if your health care provider tells you not to drink. If you drink alcohol: Limit how much you have to 0-2 drinks a day. Know how much alcohol is in your drink. In the [place] one drink equals one 12 oz bottle of beer (355 mL), one 5 oz glass of wine (148 mL), or one 1 oz glass of hard liquor (44 mL). Lifestyle Brush your teeth every morning and night with fluoride toothpaste. Floss one time each day. Exercise for at least 30 minutes 5 or more days each week. Do not use any products that contain nicotine or tobacco. These products  include cigarettes, chewing tobacco, and vaping devices, such as e-cigarettes. If you need help quitting, ask your health care provider. Do not use drugs. If you are sexually active, practice safe sex. Use a condom or other  form of protection to prevent STIs. Take aspirin only as told by your health care provider. Make sure that you understand how much to take and what form to take. Work with your health care provider to find out whether it is safe and beneficial for you to take aspirin daily. Ask your health care provider if you need to take a cholesterol-lowering medicine (statin). Find healthy ways to manage stress, such as: Meditation, yoga, or listening to music. Journaling. Talking to a trusted person. Spending time with friends and family. Safety Always wear your seat belt while driving or riding in a vehicle. Do not drive: If you have been drinking alcohol. Do not ride with someone who has been drinking. When you are tired or distracted. While texting. If you have been using any mind-altering substances or drugs. Wear a helmet and other protective equipment during sports activities. If you have firearms in your house, make sure you follow all gun safety procedures. Minimize exposure to UV radiation to reduce your risk of skin cancer. What's next? Visit your health care provider once a year for an annual wellness visit. Ask your health care provider how often you should have your eyes and teeth checked. Stay up to date on all vaccines. This information is not intended to replace advice given to you by your health care provider. Make sure you discuss any questions you have with your health care provider. Document Revised: 06/15/2020 Document Reviewed: 06/15/2020 Elsevier Patient Education  2024 [employer].

## 2022-08-17 ENCOUNTER — Other Ambulatory Visit: Payer: Self-pay | Admitting: Family Medicine

## 2022-08-17 ENCOUNTER — Encounter: Payer: Self-pay | Admitting: Family Medicine

## 2022-08-17 ENCOUNTER — Telehealth: Payer: PPO | Admitting: Family Medicine

## 2022-08-17 VITALS — Ht 68.0 in

## 2022-08-17 DIAGNOSIS — U071 COVID-19: Secondary | ICD-10-CM

## 2022-08-17 MED ORDER — NIRMATRELVIR/RITONAVIR (PAXLOVID)TABLET
3.0000 | ORAL_TABLET | Freq: Two times a day (BID) | ORAL | 0 refills | Status: DC
Start: 2022-08-17 — End: 2022-11-05

## 2022-08-17 MED ORDER — NIRMATRELVIR/RITONAVIR (PAXLOVID)TABLET: 3.0000 | ORAL_TABLET | Freq: Two times a day (BID) | ORAL | 0 refills | Status: DC

## 2022-08-17 MED ORDER — NIRMATRELVIR/RITONAVIR (PAXLOVID)TABLET
3.0000 | ORAL_TABLET | Freq: Two times a day (BID) | ORAL | 0 refills | Status: DC
Start: 2022-08-17 — End: 2022-08-17

## 2022-08-17 NOTE — Progress Notes (Signed)
Virtual Visit via Video Note  I connected with Louis [name] on 08/17/22 at 2:27 PM by a video enabled telemedicine application and verified that I am speaking with the correct person using two identifiers.   Patient Location: Home Provider Location: office - [facility].    I discussed the limitations, risks, security and privacy concerns of performing an evaluation and management service by telephone and the availability of in person appointments. I also discussed with the patient that there may be a patient responsible charge related to this service. The patient expressed understanding and agreed to proceed, consent obtained  Chief Complaint  Patient presents with   Covid Positive    Pt reports he tested positive today at home Sx scratchy throat , loose stool last night. NO OTC    History of Present Illness: Louis [name] is a 67 y.o. male that tested positive for covid at home this morning.  +loose stool +scratchy throat  -headaches -fever -body aches -chest pain -SHOB/cough  -ear pain or drainage -nasal congestion or drainage -nausea or vomiting.   Symptoms started last night.  Denies taking any medication for symptoms.    Patient Active Problem List   Diagnosis Date Noted   Thoracic aortic aneurysm without rupture (HCC) 10/10/2021   Dermatochalasis 02/10/2021   Ectropion 02/10/2021   Claw toe, acquired, left    Bunion of great toe of left foot    Skin lesion of face 06/14/2020   Unilateral primary osteoarthritis, right knee 01/06/2020   S/P hernia repair 10/15/2019   Neuropathy 07/20/2019   Macrocytic anemia    Debility 07/07/2019   Atelectasis    Enterococcal bacteremia 06/26/2019   Perforated viscus 01/06/2019   Onychomycosis 05/19/2018   Foot fracture, left, closed, initial encounter 05/09/2018   Lisfranc dislocation, left, initial encounter    Primary pulmonary hypertension (HCC) 07/09/2017   Diastolic dysfunction 07/08/2017   OSA  (obstructive sleep apnea) 07/08/2017   SOB (shortness of breath) 07/02/2017   Chronic respiratory failure with hypoxia and hypercapnia (HCC) 07/02/2017   Cardiomegaly 07/02/2017   Exposure keratitis 02/17/2017   Pedal edema 12/18/2016   Transaminitis 12/18/2016   Bilateral pes planus 12/18/2016   Psoriasis-eczema overlap condition 07/30/2016   High serum high density lipoprotein (HDL) 09/26/2015   Epistaxis 09/26/2015   Renal artery stenosis in 1 of 2 vessels (HCC)    Essential tremor    Elevated PSA    Obesity, Class I, BMI 30-34.9    Mild intermittent asthma in adult without complication    Seasonal allergies    HTN (hypertension)    Scoliosis    Past Medical History:  Diagnosis Date   Alcohol use disorder, mild, abuse    Anemia    Anxiety    situational   Arthritis    Phreesia 02/21/2020   Asthma    Phreesia 02/21/2020   BPH (benign prostatic hyperplasia)    on flomax- Patien tand wife denies- and patient has never been on Flomax   Bundle branch block    per prior PCP records   CHF (congestive heart failure) (HCC)    Chronic kidney disease    Acute Renal Failure- June 2021- admission- kidneys recovered quickly   Complication of anesthesia 03/11/2019   after sx, woke up, but was paralyzed   COPD (chronic obstructive pulmonary disease) (HCC)    Diverticulosis    by colonoscopy   Eczema    per prior PCP records   Elevated PSA 2015   peaked 6s,  s/p benign biopsy 2014, sees urology [facility])   Essential tremor    GERD (gastroesophageal reflux disease)    per prior PCP records   History of panic attacks    per prior PCP records   HTN (hypertension)    Hypertension    Phreesia 02/21/2020   Mild intermittent asthma in adult without complication    Obesity, Class I, BMI 30-34.9    Osteoporosis    DEXA 06/2013 with osteopenia - h/o ?wrist/hip fracture from AVN from chronic steroid use (asthma), took reclast for 1 year   Pneumonia    10/08/19 >10 years ago    Seasonal allergies    Sleep apnea    Substance abuse (HCC)    Phreesia 02/21/2020   Thoracic aortic aneurysm [facility])    Thoracic scoliosis childhood   Transaminitis    per prior PCP records   Vitamin B 12 deficiency    Past Surgical History:  Procedure Laterality Date   APPLICATION OF WOUND VAC Left 05/09/2018   Procedure: Application Of Wound Vac;  Surgeon: Nadara Mustard, MD;  Location: [facility] OR;  Service: Orthopedics;  Laterality: Left;   BIOPSY  04/30/2019   Procedure: BIOPSY;  Surgeon: Charna Elizabeth, MD;  Location: [facility];  Service: Endoscopy;;   BROW LIFT Bilateral 04/12/2021   Procedure: UPPER LID BLEPHAROPLASTY;  Surgeon: Peggye Form, DO;  Location: [facility];  Service: Plastics;  Laterality: Bilateral;   COLONOSCOPY  12/2013   polyps, int hem, diverticulosis, rpt 5 yrs [address])   COLONOSCOPY WITH PROPOFOL N/A 04/30/2019   Procedure: COLONOSCOPY WITH PROPOFOL;  Surgeon: Charna Elizabeth, MD;  Location: [facility];  Service: Endoscopy;  Laterality: N/A;   ELBOW SURGERY Right 2008   golfer's elbow   ESOPHAGOGASTRODUODENOSCOPY (EGD) WITH PROPOFOL N/A 04/30/2019   Procedure: ESOPHAGOGASTRODUODENOSCOPY (EGD) WITH PROPOFOL;  Surgeon: Charna Elizabeth, MD;  Location: [facility];  Service: Endoscopy;  Laterality: N/A;   HERNIA REPAIR N/A    Phreesia 02/21/2020   INCISIONAL HERNIA REPAIR N/A 10/15/2019   Procedure: HERNIA REPAIR INCISIONAL WITH MESH, TAR;  Surgeon: Axel Filler, MD;  Location[facility] OR;  Service: General;  Laterality: N/A;   INGUINAL HERNIA REPAIR Bilateral childhood & ~1995   LAPAROTOMY N/A 01/05/2019   Procedure: EXPLORATORY LAPAROTOMY graham patch repair;  Surgeon: Sung Amabile, DO;  Location: [facility] ORS;  Service: General;  Laterality: N/A;   LAPAROTOMY N/A 10/15/2019   Procedure: EXPLORATORY LAPAROTOMY;  Surgeon: Axel Filler, MD;  Location: [facility] OR;  Service: General;  Laterality: N/A;   LYSIS OF ADHESION N/A 10/15/2019   Procedure: LYSIS OF  ADHESION;  Surgeon: Axel Filler, MD;  Location: [facility] OR;  Service: General;  Laterality: N/A;   NASAL SEPTUM SURGERY  2013   deviated septum   ORIF ANKLE FRACTURE Left 05/09/2018   Procedure: OPEN REDUCTION INTERNAL FIXATION LEFT LISFRANC FRACTURE/DISLOCATION;  Surgeon: Nadara Mustard, MD;  Location: [facility] OR;  Service: Orthopedics;  Laterality: Left;   POLYPECTOMY  04/30/2019   Procedure: POLYPECTOMY;  Surgeon: Charna Elizabeth, MD;  Location: [facility];  Service: Endoscopy;;   SHOULDER ARTHROSCOPY Left 05/31/2022   Procedure: ARTHROSCOPY SHOULDER DEBRIDEMENT;  Surgeon: Bjorn Pippin, MD;  Location: [facility];  Service: Orthopedics;  Laterality: Left;   SHOULDER ARTHROSCOPY WITH BANKART REPAIR Left 05/31/2022   Procedure: SHOULDER ARTHROSCOPY WITH BANKART REPAIR;  Surgeon: Bjorn Pippin, MD;  Location:  SURGERY CENTER;  Service: Orthopedics;  Laterality: Left;   TEE WITHOUT CARDIOVERSION N/A 06/29/2019  Procedure: TRANSESOPHAGEAL ECHOCARDIOGRAM (TEE);  Surgeon: Elease Hashimoto Deloris Ping, MD;  Location: [facility] END[facility]COPY;  Service: Cardiovascular;  Laterality: N/A;   US ECHOCARDIOGRAPHY  02/2009   WNL, EF >55% [facility])   US RENAL/AORTA Right 05/2009   1-59% diameter reduction renal artery, rec rpt 2 yrs   VASECTOMY N/A    Phreesia 02/21/2020   WEIL OSTEOTOMY Left 11/16/2020   Procedure: WEIL OSTEOTOMY LEFT 2ND METATARSAL,  2ND TOE DISTAL INTERPHALANGEAL RESECTION AND Quintella Reichert OSTEOTOMY WEIL OSTEOTOMY THIRD METATARSAL;  Surgeon: Nadara Mustard, MD;  Location: [facility] OR;  Service: Orthopedics;  Laterality: Left;   Allergies  Allergen Reactions   Accupril [Quinapril Hcl] Cough   Nsaids     Perforated gastric ulcer   Quinolones Other (See Comments)    Hx of thoracic aortic aneurysm. Avoid quinolones.    Prior to Admission medications   Medication Sig Start Date End Date Taking? Authorizing Provider  albuterol (VENTOLIN HFA) 108 (90 Base) MCG/ACT inhaler Inhale 2 puffs into the lungs every 6  (six) hours as needed for wheezing or shortness of breath. 02/23/22  Yes Sheliah Hatch, MD  amLODipine (NORVASC) 10 MG tablet Take one tablet daily 08/15/22  Yes Shade Flood, MD  atorvastatin (LIPITOR) 10 MG tablet TAKE 1 TABLET BY MOUTH 2 TO 3 DAYS PER WEEK 07/02/22  Yes Shade Flood, MD  diclofenac Sodium (VOLTAREN) 1 % GEL Apply 1 application topically 4 (four) times daily as needed (knee pain).   Yes [provider]  fluticasone (FLONASE) 50 MCG/ACT nasal spray Place into both nostrils daily.   Yes [provider]  gabapentin (NEURONTIN) 300 MG capsule TAKE 1 CAPSULE(300 MG) BY MOUTH THREE TIMES DAILY 08/03/22  Yes Shade Flood, MD  losartan-hydrochlorothiazide (HYZAAR) 100-12.5 MG tablet Take 1 tablet by mouth daily. 08/15/22  Yes Shade Flood, MD  metoprolol succinate (TOPROL-XL) 50 MG 24 hr tablet Take with or immediately following a meal. 02/13/22  Yes Shade Flood, MD  Multiple Vitamin (MULTIVITAMIN WITH MINERALS) TABS tablet Take 1 tablet by mouth daily. 07/08/19  Yes Dahal, Melina Schools, MD  olopatadine (PATANOL) 0.1 % ophthalmic solution Place 1 drop into both eyes 2 (two) times daily as needed for allergies.   Yes [provider]  omeprazole (PRILOSEC) 20 MG capsule Take 20 mg by mouth daily.   Yes [provider]   Social History   Socioeconomic History   Marital status: Married    Spouse name: Not on file   Number of children: Not on file   Years of education: Not on file   Highest education level: Not on file  Occupational History   Not on file  Tobacco Use   Smoking status: Never   Smokeless tobacco: Never  Vaping Use   Vaping status: Never Used  Substance and Sexual Activity   Alcohol use: Not Currently   Drug use: No   Sexual activity: Yes    Birth control/protection: None  Other Topics Concern   Not on file  Social History Narrative   Lives with wife (retired Web designer), 12yo dog died 22-Sep-2014   Grown children    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  Constitutional:      General: He is not in acute distress.    Appearance: Normal appearance. He is obese. He is not ill-appearing, toxic-appearing or diaphoretic.  HENT:     Head: Normocephalic and atraumatic.  Eyes:     General:        Right eye: No discharge.        Left eye: No discharge.     Conjunctiva/sclera: Conjunctivae normal.  Pulmonary:     Effort: Pulmonary effort is normal. No respiratory distress.  Skin:    General: Skin is dry.  Neurological:     General: No focal deficit present.     Mental Status: He is alert and oriented to person, place, and time. Mental status is at baseline.  Psychiatric:        Mood and Affect: Mood normal.        Behavior: Behavior normal.        Thought Content: Thought content normal.        Judgment: Judgment normal.    Assessment and Plan: COVID -     nirmatrelvir/ritonavir; Take 3 tablets by mouth 2 (two) times daily for 5 days. Patient GFR is 0.90 Take nirmatrelvir (150 mg) two tablets twice daily for 5 days and ritonavir (100 mg) one tablet twice daily for 5 days.  Dispense: 30 tablet; Refill: 0  -Prescribed Paxlovid for covid positive. Instructed patient to not take Atrovastatin 10mg  daily for 14 days.  -Discussed the covid quarantine guidelines.  -Rest, hydrate.  -If you develop chest pain or shortness of breath, go directly to the emergency department.  -Alternate Tylenol 1000mg   and Ibuprofen 600-800mg  every 4 hours for pain, headache, and body aches. Recommend to eat something when taking Ibuprofen.   -You may take over the counter Vitamin D 1,000IU; Vitamin C 1,000mg , and Zince 50-100mg  daily for 30 days to support your immune system.   Follow Up Instructions: If not improved.   I discussed the assessment and treatment plan with the patient. The patient was provided an opportunity to ask questions and all were answered. The patient agreed with the plan and demonstrated an understanding of the instructions.   The patient was advised to call back or seek an in-person evaluation if the symptoms worsen or if the condition fails to improve as anticipated.  Zandra Abts, NP

## 2022-08-17 NOTE — Progress Notes (Signed)
Received call from patient while on call.  He had an issue getting his prescription for some reason, and he asked that I call the script in manually, which I have done.

## 2022-08-17 NOTE — Telephone Encounter (Signed)
Pt has a virtural appt at 2pm with Waymon Budge

## 2022-08-20 NOTE — Telephone Encounter (Signed)
Pt reports he did get his Paxlovid . He has no issues and concerns.

## 2022-08-20 NOTE — Telephone Encounter (Signed)
Patient Name First: Louis Last: [name] Gender: Male DOB: Feb 12, 1955 Age: 67 Y 1 M 22 D Return Phone Number: [phone] (Primary) Address: City/ State/ Zip: [place]  [address] Client [facility] Night - C Client Site [facility] Provider Meredith Staggers- MD Contact Type Call Who Is Calling Patient / Member / Family / Caregiver Call Type Triage / Clinical Relationship To Patient Self Return Phone Number [phone] (Primary) Chief Complaint Prescription Refill or Medication Request (non symptomatic) Reason for Call Medication Question / Request Initial Comment Caller states he was needing Paxlovid sent but there seems an error in sending his order. they are requesting a verbal order. Translation No No Triage Reason Other Nurse Assessment Nurse: Lillia Abed, RN, [facility] Date/Time [name]): 08/17/2022 5:30:47 PM Confirm and document reason for call. If symptomatic, describe symptoms. ---Caller states he has COVID and saw NP today via televisit and the pharmacy needs a verbal Rx for Paxlovid. The NP sent it and then someone from the office resent it but they still have not received it. Pt has a little more of a runny nose and weird taste in his mouth since being seen. Karin Golden at [facility] center [phone] (pharmacy direct line: [phone]) and closes at 2000. Pt tested positive this morning. Does the patient have any new or worsening symptoms? ---Yes Will a triage be completed? ---No Select reason for no triage. ---Other Disp. Time [name]) Disposition Final User 08/17/2022 5:45:12 PM Pharmacy Call Lillia Abed, RN, [facility] Hessel[facility] Reason: Triager verified that Paxlovid not called in to pt's pharmacy and since this is a time-sensitive med, calling OCP to see if it can be called in before Monday. PLEASE NOTE: All timestamps contained within this report are represented as [place] Standard  Time. CONFIDENTIALTY NOTICE: This fax transmission is intended only for the addressee. It contains information that is legally privileged, confidential or otherwise protected from use or disclosure. If you are not the intended recipient, you are strictly prohibited from reviewing, disclosing, copying using or disseminating any of this information or taking any action in reliance on or regarding this information. If you have received this fax in error, please notify us immediately by telephone so that we can arrange for its return to [place]. Phone: [phone], Toll-Free: [phone], Fax: [phone] Page: 2 of 2 Call Id: [id number] Disp. Time [name]) Disposition Final User 08/17/2022 5:48:59 PM Called On-Call Provider Lillia Abed, RN, [facility] 08/17/2022 5:51:01 PM Clinical Call Yes Lillia Abed RN, [facility] Final Disposition 08/17/2022 5:51:01 PM Clinical Call Yes Lillia Abed, RN, [facility] Comments User: Edmund Hilda, RN Date/Time [name]): 08/17/2022 5:50:53 PM Notified caller that on-call to call in Rx to desired pharmacy. Caller verbalized understanding. Paging DoctorName Phone DateTime Result/ Outcome Message Type Notes Hannah Beat - MD [phone] 08/17/2022 5:48:59 PM Called On Call Provider - Reached Doctor Paged Hannah Beat - MD 08/17/2022 5:49:54 PM Spoke with On Call - General Message Result Per OCP, it looks like it was sent in but he will call it in to the pharmacy. Verified the pharmacy name and address.  I will check on this - this morning.

## 2022-08-21 ENCOUNTER — Ambulatory Visit (HOSPITAL_BASED_OUTPATIENT_CLINIC_OR_DEPARTMENT_OTHER): Payer: BC Managed Care – PPO | Admitting: Pulmonary Disease

## 2022-08-22 DIAGNOSIS — S42102D Fracture of unspecified part of scapula, left shoulder, subsequent encounter for fracture with routine healing: Secondary | ICD-10-CM | POA: Diagnosis not present

## 2022-08-22 DIAGNOSIS — M6281 Muscle weakness (generalized): Secondary | ICD-10-CM | POA: Diagnosis not present

## 2022-08-22 DIAGNOSIS — S43005D Unspecified dislocation of left shoulder joint, subsequent encounter: Secondary | ICD-10-CM | POA: Diagnosis not present

## 2022-08-22 DIAGNOSIS — M24112 Other articular cartilage disorders, left shoulder: Secondary | ICD-10-CM | POA: Diagnosis not present

## 2022-08-22 DIAGNOSIS — M25612 Stiffness of left shoulder, not elsewhere classified: Secondary | ICD-10-CM | POA: Diagnosis not present

## 2022-08-23 ENCOUNTER — Other Ambulatory Visit (INDEPENDENT_AMBULATORY_CARE_PROVIDER_SITE_OTHER): Payer: PPO

## 2022-08-23 DIAGNOSIS — E785 Hyperlipidemia, unspecified: Secondary | ICD-10-CM

## 2022-08-23 DIAGNOSIS — Z131 Encounter for screening for diabetes mellitus: Secondary | ICD-10-CM | POA: Diagnosis not present

## 2022-08-23 DIAGNOSIS — I10 Essential (primary) hypertension: Secondary | ICD-10-CM | POA: Diagnosis not present

## 2022-08-23 DIAGNOSIS — Z0000 Encounter for general adult medical examination without abnormal findings: Secondary | ICD-10-CM | POA: Diagnosis not present

## 2022-08-23 LAB — CBC WITH DIFFERENTIAL/PLATELET
Basophils Absolute: 0.1 10*3/uL (ref 0.0–0.1)
Basophils Relative: 1.9 % (ref 0.0–3.0)
Eosinophils Absolute: 0.4 10*3/uL (ref 0.0–0.7)
Eosinophils Relative: 7.2 % — ABNORMAL HIGH (ref 0.0–5.0)
HCT: 42.9 % (ref 39.0–52.0)
Hemoglobin: 14.1 g/dL (ref 13.0–17.0)
Lymphocytes Relative: 33.9 % (ref 12.0–46.0)
Lymphs Abs: 1.9 10*3/uL (ref 0.7–4.0)
MCHC: 33 g/dL (ref 30.0–36.0)
MCV: 91.1 fl (ref 78.0–100.0)
Monocytes Absolute: 0.5 10*3/uL (ref 0.1–1.0)
Monocytes Relative: 9.7 % (ref 3.0–12.0)
Neutro Abs: 2.7 10*3/uL (ref 1.4–7.7)
Neutrophils Relative %: 47.3 % (ref 43.0–77.0)
Platelets: 330 10*3/uL (ref 150.0–400.0)
RBC: 4.7 Mil/uL (ref 4.22–5.81)
RDW: 13 % (ref 11.5–15.5)
WBC: 5.7 10*3/uL (ref 4.0–10.5)

## 2022-08-23 LAB — LIPID PANEL
Cholesterol: 172 mg/dL (ref 0–200)
HDL: 45.7 mg/dL (ref >39.00)
LDL Cholesterol: 112 mg/dL — ABNORMAL HIGH (ref 0–99)
NonHDL: 126.22
Total CHOL/HDL Ratio: 4
Triglycerides: 71 mg/dL (ref 0.0–149.0)
VLDL: 14.2 mg/dL (ref 0.0–40.0)

## 2022-08-23 LAB — COMPREHENSIVE METABOLIC PANEL
ALT: 106 U/L — ABNORMAL HIGH (ref 0–53)
AST: 47 U/L — ABNORMAL HIGH (ref 0–37)
Albumin: 4.5 g/dL (ref 3.5–5.2)
Alkaline Phosphatase: 67 U/L (ref 39–117)
BUN: 15 mg/dL (ref 6–23)
CO2: 27 mEq/L (ref 19–32)
Calcium: 9.4 mg/dL (ref 8.4–10.5)
Chloride: 98 mEq/L (ref 96–112)
Creatinine, Ser: 0.8 mg/dL (ref 0.40–1.50)
GFR: 93.1 mL/min (ref >60.00)
Glucose, Bld: 103 mg/dL — ABNORMAL HIGH (ref 70–99)
Potassium: 4.2 mEq/L (ref 3.5–5.1)
Sodium: 135 mEq/L (ref 135–145)
Total Bilirubin: 0.5 mg/dL (ref 0.2–1.2)
Total Protein: 7 g/dL (ref 6.0–8.3)

## 2022-08-23 LAB — HEMOGLOBIN A1C: Hgb A1c MFr Bld: 5.2 % (ref 4.6–6.5)

## 2022-08-24 DIAGNOSIS — M24112 Other articular cartilage disorders, left shoulder: Secondary | ICD-10-CM | POA: Diagnosis not present

## 2022-08-27 DIAGNOSIS — S42102D Fracture of unspecified part of scapula, left shoulder, subsequent encounter for fracture with routine healing: Secondary | ICD-10-CM | POA: Diagnosis not present

## 2022-08-27 DIAGNOSIS — S43005D Unspecified dislocation of left shoulder joint, subsequent encounter: Secondary | ICD-10-CM | POA: Diagnosis not present

## 2022-08-27 DIAGNOSIS — M25612 Stiffness of left shoulder, not elsewhere classified: Secondary | ICD-10-CM | POA: Diagnosis not present

## 2022-08-27 DIAGNOSIS — M6281 Muscle weakness (generalized): Secondary | ICD-10-CM | POA: Diagnosis not present

## 2022-08-27 DIAGNOSIS — M24112 Other articular cartilage disorders, left shoulder: Secondary | ICD-10-CM | POA: Diagnosis not present

## 2022-08-28 ENCOUNTER — Other Ambulatory Visit: Payer: Self-pay

## 2022-08-28 ENCOUNTER — Telehealth: Payer: Self-pay

## 2022-08-28 DIAGNOSIS — R7989 Other specified abnormal findings of blood chemistry: Secondary | ICD-10-CM

## 2022-08-28 NOTE — Telephone Encounter (Signed)
Pt aware of results and he is going to [facility] to have his blood drew . Liver function order is in

## 2022-08-28 NOTE — Telephone Encounter (Signed)
-----   Message from Shade Flood sent at 08/28/2022 10:39 AM EDT ----- Total cholesterol, LDL cholesterol slightly higher than previous readings but other cholesterol numbers look stable.  Blood counts overall looked okay.  12-month blood sugar test looked okay.    Liver tests were elevated.  Please schedule lab visit in the next few days for hepatic function panel, diagnosis elevated liver function tests.  Depending on those results can decide on follow-up or med changes.  If any abdominal pain, nausea, vomiting or yellowing of the skin or eyes be seen right away.  I do not expect that to occur.

## 2022-08-29 DIAGNOSIS — S42102D Fracture of unspecified part of scapula, left shoulder, subsequent encounter for fracture with routine healing: Secondary | ICD-10-CM | POA: Diagnosis not present

## 2022-08-29 DIAGNOSIS — S43005D Unspecified dislocation of left shoulder joint, subsequent encounter: Secondary | ICD-10-CM | POA: Diagnosis not present

## 2022-08-29 DIAGNOSIS — M25612 Stiffness of left shoulder, not elsewhere classified: Secondary | ICD-10-CM | POA: Diagnosis not present

## 2022-08-29 DIAGNOSIS — M6281 Muscle weakness (generalized): Secondary | ICD-10-CM | POA: Diagnosis not present

## 2022-08-29 DIAGNOSIS — M24112 Other articular cartilage disorders, left shoulder: Secondary | ICD-10-CM | POA: Diagnosis not present

## 2022-09-05 DIAGNOSIS — S43005D Unspecified dislocation of left shoulder joint, subsequent encounter: Secondary | ICD-10-CM | POA: Diagnosis not present

## 2022-09-05 DIAGNOSIS — M24112 Other articular cartilage disorders, left shoulder: Secondary | ICD-10-CM | POA: Diagnosis not present

## 2022-09-05 DIAGNOSIS — M6281 Muscle weakness (generalized): Secondary | ICD-10-CM | POA: Diagnosis not present

## 2022-09-05 DIAGNOSIS — S42102D Fracture of unspecified part of scapula, left shoulder, subsequent encounter for fracture with routine healing: Secondary | ICD-10-CM | POA: Diagnosis not present

## 2022-09-05 DIAGNOSIS — M25612 Stiffness of left shoulder, not elsewhere classified: Secondary | ICD-10-CM | POA: Diagnosis not present

## 2022-09-10 ENCOUNTER — Other Ambulatory Visit (INDEPENDENT_AMBULATORY_CARE_PROVIDER_SITE_OTHER): Payer: PPO

## 2022-09-10 ENCOUNTER — Other Ambulatory Visit: Payer: Self-pay | Admitting: Surgery

## 2022-09-10 DIAGNOSIS — I712 Thoracic aortic aneurysm, without rupture, unspecified: Secondary | ICD-10-CM

## 2022-09-10 DIAGNOSIS — R7989 Other specified abnormal findings of blood chemistry: Secondary | ICD-10-CM

## 2022-09-10 LAB — HEPATIC FUNCTION PANEL
ALT: 14 U/L (ref 0–53)
AST: 15 U/L (ref 0–37)
Albumin: 4.2 g/dL (ref 3.5–5.2)
Alkaline Phosphatase: 56 U/L (ref 39–117)
Bilirubin, Direct: 0.2 mg/dL (ref 0.0–0.3)
Total Bilirubin: 0.9 mg/dL (ref 0.2–1.2)
Total Protein: 6.9 g/dL (ref 6.0–8.3)

## 2022-09-17 DIAGNOSIS — S43005D Unspecified dislocation of left shoulder joint, subsequent encounter: Secondary | ICD-10-CM | POA: Diagnosis not present

## 2022-09-17 DIAGNOSIS — S42102D Fracture of unspecified part of scapula, left shoulder, subsequent encounter for fracture with routine healing: Secondary | ICD-10-CM | POA: Diagnosis not present

## 2022-09-17 DIAGNOSIS — M6281 Muscle weakness (generalized): Secondary | ICD-10-CM | POA: Diagnosis not present

## 2022-09-17 DIAGNOSIS — M25612 Stiffness of left shoulder, not elsewhere classified: Secondary | ICD-10-CM | POA: Diagnosis not present

## 2022-09-17 DIAGNOSIS — M24112 Other articular cartilage disorders, left shoulder: Secondary | ICD-10-CM | POA: Diagnosis not present

## 2022-09-21 DIAGNOSIS — S42102D Fracture of unspecified part of scapula, left shoulder, subsequent encounter for fracture with routine healing: Secondary | ICD-10-CM | POA: Diagnosis not present

## 2022-09-21 DIAGNOSIS — M25612 Stiffness of left shoulder, not elsewhere classified: Secondary | ICD-10-CM | POA: Diagnosis not present

## 2022-09-21 DIAGNOSIS — S43005D Unspecified dislocation of left shoulder joint, subsequent encounter: Secondary | ICD-10-CM | POA: Diagnosis not present

## 2022-09-21 DIAGNOSIS — M6281 Muscle weakness (generalized): Secondary | ICD-10-CM | POA: Diagnosis not present

## 2022-09-21 DIAGNOSIS — M24112 Other articular cartilage disorders, left shoulder: Secondary | ICD-10-CM | POA: Diagnosis not present

## 2022-09-25 DIAGNOSIS — M546 Pain in thoracic spine: Secondary | ICD-10-CM | POA: Diagnosis not present

## 2022-09-28 ENCOUNTER — Encounter: Payer: Self-pay | Admitting: Family Medicine

## 2022-09-28 DIAGNOSIS — G629 Polyneuropathy, unspecified: Secondary | ICD-10-CM

## 2022-09-28 DIAGNOSIS — E538 Deficiency of other specified B group vitamins: Secondary | ICD-10-CM

## 2022-09-28 MED ORDER — GABAPENTIN 300 MG PO CAPS
300.0000 mg | ORAL_CAPSULE | Freq: Three times a day (TID) | ORAL | 1 refills | Status: DC
Start: 2022-09-28 — End: 2023-06-27

## 2022-09-28 NOTE — Telephone Encounter (Signed)
Patient requesting change in dose of Gabapentin notes with increased activity he is using 2 tablets 3 times daily rather than 1 tablet 3 times daily.   Okay to change the prescription?

## 2022-09-28 NOTE — Telephone Encounter (Signed)
Gabapentin discussed at his August 14 visit.  600 mg in the morning, additional 1-2 times with increased activity and recent shoulder issue.  Controlled substance database reviewed.  #270 last filled on 08/03/2022.  If using up to 6/day, that will run out in 45 days.  New prescription ordered for #540.  Will advise patient.

## 2022-10-09 DIAGNOSIS — M546 Pain in thoracic spine: Secondary | ICD-10-CM | POA: Diagnosis not present

## 2022-10-23 ENCOUNTER — Ambulatory Visit
Admission: RE | Admit: 2022-10-23 | Discharge: 2022-10-23 | Disposition: A | Discharge status: Home / Self Care | Payer: PPO | Source: Ambulatory Visit | Attending: Surgery | Admitting: Surgery

## 2022-10-23 DIAGNOSIS — I7121 Aneurysm of the ascending aorta, without rupture: Secondary | ICD-10-CM | POA: Diagnosis not present

## 2022-10-23 DIAGNOSIS — I712 Thoracic aortic aneurysm, without rupture, unspecified: Secondary | ICD-10-CM

## 2022-10-23 MED ORDER — IOPAMIDOL (ISOVUE-370) INJECTION 76%
500.0000 mL | Freq: Once | INTRAVENOUS | Status: AC | PRN
  Administered 2022-10-23: 75 mL via INTRAVENOUS

## 2022-10-24 DIAGNOSIS — M546 Pain in thoracic spine: Secondary | ICD-10-CM | POA: Diagnosis not present

## 2022-10-25 ENCOUNTER — Ambulatory Visit: Payer: PPO

## 2022-11-05 ENCOUNTER — Ambulatory Visit: Payer: PPO | Admitting: Physician Assistant

## 2022-11-05 VITALS — BP 116/73 | HR 65 | Resp 20 | Ht 68.0 in | Wt 200.0 lb

## 2022-11-05 DIAGNOSIS — I712 Thoracic aortic aneurysm, without rupture, unspecified: Secondary | ICD-10-CM

## 2022-11-05 NOTE — Progress Notes (Signed)
[address]       [place] [address]             [phone]       History of Present Illness: [name] is a 67 year old gentleman with past history of hypertension, dyslipidemia, congestive heart failure, hyponatremia, neuropathy, B12 deficiency, obstructive sleep apnea treated with CPAP, restrictive lung disease due to severe scoliosis, and past history of alcohol abuse.  He had a calcium scoring CTA chest a little over a year ago with a relatively low overall calcium score he was noted to have a 4.1 cm ascending aortic aneurysm previous CTA is obtained in 2019 and 2021 does not report ascending aortic aneurysms but review of the images shows mild dilation of the thoracic aorta. Mr. Kaneshiro was seen last year in consultation for surveillance of the thoracic aneurysm.   He reports no new symptoms since his last visit.  He keeps close watch on his blood pressure.   Current Outpatient Medications  Medication Sig Dispense Refill   albuterol (VENTOLIN HFA) 108 (90 Base) MCG/ACT inhaler Inhale 2 puffs into the lungs every 6 (six) hours as needed for wheezing or shortness of breath. 8 g 0   amLODipine (NORVASC) 10 MG tablet Take one tablet daily 90 tablet 2   atorvastatin (LIPITOR) 10 MG tablet TAKE 1 TABLET BY MOUTH 2 TO 3 DAYS PER WEEK 30 tablet 1   diclofenac Sodium (VOLTAREN) 1 % GEL Apply 1 application topically 4 (four) times daily as needed (knee pain).     fluticasone (FLONASE) 50 MCG/ACT nasal spray Place into both nostrils daily.     gabapentin (NEURONTIN) 300 MG capsule Take 1-2 capsules (300-600 mg total) by mouth 3 (three) times daily. 540 capsule 1   losartan-hydrochlorothiazide (HYZAAR) 100-12.5 MG tablet Take 1 tablet by mouth daily. 90 tablet 2   metoprolol succinate (TOPROL-XL) 50 MG 24 hr tablet Take with or immediately following a meal. 90 tablet 2   Multiple Vitamin (MULTIVITAMIN WITH MINERALS) TABS tablet Take 1 tablet by mouth daily.      nirmatrelvir/ritonavir (PAXLOVID) 20 x 150 MG & 10 x 100MG  TABS Take 3 tablets by mouth 2 (two) times daily for 5 days. Patient GFR is 0.90 Take nirmatrelvir (150 mg) two tablets twice daily for 5 days and ritonavir (100 mg) one tablet twice daily for 5 days. 30 tablet 0   nirmatrelvir/ritonavir (PAXLOVID) 20 x 150 MG & 10 x 100MG  TABS Take 3 tablets by mouth 2 (two) times daily for 5 days. (Take nirmatrelvir 150 mg two tablets twice daily for 5 days and ritonavir 100 mg one tablet twice daily for 5 days) Patient GFR is 60 30 tablet 0   olopatadine (PATANOL) 0.1 % ophthalmic solution Place 1 drop into both eyes 2 (two) times daily as needed for allergies.     omeprazole (PRILOSEC) 20 MG capsule Take 20 mg by mouth daily.     No current facility-administered medications for this visit.    Physical Exam: Vital signs BP 116/73 Pulse 65 Respirations 20 SpO2 94% on room air  General: Mr. Puls is a very pleasant 67 year old male in no distress. Neuro intact Heart: Regular rate and rhythm Chest: Significant rightward thoracic scoliosis Extremities: No deformity, well-perfused  Diagnostic Tests: CLINICAL DATA:  Thoracic aortic aneurysm.   EXAM: CT ANGIOGRAPHY CHEST WITH CONTRAST   TECHNIQUE: Multidetector CT imaging of the chest was performed using the standard protocol during bolus administration of intravenous  contrast. Multiplanar CT image reconstructions and MIPs were obtained to evaluate the vascular anatomy.   RADIATION DOSE REDUCTION: This exam was performed according to the departmental dose-optimization program which includes automated exposure control, adjustment of the mA and/or kV according to patient size and/or use of iterative reconstruction technique.   CONTRAST:  75mL ISOVUE-370 IOPAMIDOL (ISOVUE-370) INJECTION 76%   COMPARISON:  September 12, 2021.   FINDINGS: Cardiovascular: 4 cm ascending thoracic aortic aneurysm is noted. No evidence of thoracic aortic  dissection. Normal cardiac size. No pericardial effusion.   Mediastinum/Nodes: No enlarged mediastinal, hilar, or axillary lymph nodes. Thyroid gland, trachea, and esophagus demonstrate no significant findings.   Lungs/Pleura: No pneumothorax or pleural effusion is noted. Minimal bibasilar subsegmental atelectasis or scarring is noted.   Upper Abdomen: No acute abnormality.   Musculoskeletal: Severe dextroscoliosis of thoracic spine is noted.   Review of the MIP images confirms the above findings.   IMPRESSION: 4 cm ascending thoracic aortic aneurysm. Recommend annual imaging followup by CTA or MRA. This recommendation follows 2010 ACCF/AHA/AATS/ACR/ASA/SCA/SCAI/SIR/STS/SVM Guidelines for the Diagnosis and Management of Patients with Thoracic Aortic Disease. Circulation. 2010; 121: W098-J191. Aortic aneurysm NOS (ICD10-I71.9).     Electronically Signed   By: Lupita Raider M.D.   On: 10/23/2022 12:57    Impression / Plan: Stable aneurysmal dilation of the thoracic aorta.  He remains asymptomatic.  We reviewed the recommendations for annual surveillance, careful blood pressure management, statin therapy, avoidance of strenuous activities with no lifting greater than 30 pounds, and the recommendation for avoiding fluoroquinolones.  We discussed potential symptoms of rapid expansion or dissection.  His wife, who is convalescing at home from recent spine surgery, participated in the visit by phone.   Follow-up in 1 year with CTA chest   Leary Roca, PA-C [facility] [phone]

## 2022-11-05 NOTE — Patient Instructions (Addendum)
Continue careful control of your blood pressure with a goal of less than 130/90.  Continue atorvastatin  Avoid repetitive strenuous activity such as lifting greater than 30 pounds  Avoid the fluoroquinolone class of antibiotics like t Cipro, Avelox, and Levaquin  Follow-up in 1 year with CTA chest

## 2022-11-06 DIAGNOSIS — M546 Pain in thoracic spine: Secondary | ICD-10-CM | POA: Diagnosis not present

## 2022-11-19 ENCOUNTER — Encounter (HOSPITAL_BASED_OUTPATIENT_CLINIC_OR_DEPARTMENT_OTHER): Payer: Self-pay | Admitting: Pulmonary Disease

## 2022-11-19 ENCOUNTER — Ambulatory Visit (HOSPITAL_BASED_OUTPATIENT_CLINIC_OR_DEPARTMENT_OTHER): Payer: PPO | Admitting: Pulmonary Disease

## 2022-11-19 VITALS — BP 126/72 | HR 61 | Ht 68.0 in | Wt 200.0 lb

## 2022-11-19 DIAGNOSIS — M4124 Other idiopathic scoliosis, thoracic region: Secondary | ICD-10-CM

## 2022-11-19 DIAGNOSIS — G4733 Obstructive sleep apnea (adult) (pediatric): Secondary | ICD-10-CM

## 2022-11-19 NOTE — Assessment & Plan Note (Signed)
Causing restrictive lung disease and contributing to prior episodes of hypercarbic respiratory failure

## 2022-11-19 NOTE — Progress Notes (Signed)
   Subjective:    [name], male    DOB: 11-22-1955, 67 y.o.   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 smoker, pastor for FU of OSA & restrictive lung disease due to scoliosis       PMH - Had severe asthma as a child but did not bother him as an adult, PFTs have shown moderate airway obstruction , has scoliosis but no significant restriction with normal TLC -ventral hernia repair 10/2019`  -Alcoholism, underwent rehab sober since 2021   -He was hospitalized with hypoxia & hypercarbia  In 07/2017 after trip to [facility] admission 06/2019  for enterococcal bacteremia , TEE negative-complicated by hypercarbic respiratory failure requiring mechanical ventilation for 1 day  Last OV 03/2021 >>Koben is done remarkably well with weight loss. He now only has mild OSA especially in the supine position. His events are minimal when he is in the lateral position.-continue on CPAP for now due to his prior episodes of hypercarbic respiratory failure.   60-month follow-up visit. He has done well.  He had a glenoid fracture while on a trip to [place] and needed surgery but has recovered well from it.  He is compliant with his CPAP machine and denies any problems with mask or pressure.  He uses an AirFit F20 fullface mask. His weight is stable. We discontinued oxygen in 2023 He was diagnosed with 4 cm thoracic aortic aneurysm which is being monitored.  CT angio chest was reviewed  Significant tests/ events reviewed   12/2020 ONO on CPAP/room air showed desaturation less than 88% for about 8 minutes     CT angiogram 07/2017 >> severe scoliosis, suggestion of pulmonary hypertension. Echo 07/2017 grade 2 diastolic dysfunction, normal LV function, decreased RV function but normal PA pressures EKG-right axis deviation 07/2017 ABG 7.31/72/71 on 4 L oxygen     03/2021 HST >> mild OSA, predominantly during supine position, lowest desat 83% 08/2017 NPSG - 224 lbs - AHI 39/h, >> CPAP 9 cm +  2L o2   PFTs 08/2017 >> postbronchodilator ratio 71, FEV1 61% with FVC 65%, no significant broncho dilator response, TLC 90%, DLCO 96% suggestive extraparenchymal restriction  Review of Systems neg for any significant sore throat, dysphagia, itching, sneezing, nasal congestion or excess/ purulent secretions, fever, chills, sweats, unintended wt loss, pleuritic or exertional cp, hempoptysis, orthopnea pnd or change in chronic leg swelling. Also denies presyncope, palpitations, heartburn, abdominal pain, nausea, vomiting, diarrhea or change in bowel or urinary habits, dysuria,hematuria, rash, arthralgias, visual complaints, headache, numbness weakness or ataxia.     Objective:   Physical Exam  Gen. Pleasant, well-nourished, in no distress ENT - no thrush, no pallor/icterus,no post nasal drip Neck: No JVD, no thyromegaly, no carotid bruits Lungs: no use of accessory muscles, no dullness to percussion, clear without rales or rhonchi  Cardiovascular: Rhythm regular, heart sounds  normal, no murmurs or gallops, no peripheral edema Musculoskeletal: No deformities, no cyanosis or clubbing        Assessment & Plan:

## 2022-11-19 NOTE — Assessment & Plan Note (Signed)
Louis [name] has done well with weight loss.  He has been able to keep the weight off.  He only has mild OSA based on his last sleep study which is mainly positional but I have asked him to stay on CPAP due to his prior episodes of hypercarbic respiratory failure and restrictive lung disease CPAP download was reviewed and this shows excellent compliance on auto settings 12 to 15 cm with average pressure of 13.  He has a mild leak.  CPAP is only helped improve his daytime somnolence and fatigue. CPAP supplies will be renewed for a year.  He does not need hypoglossal nerve stimulator therapy  Weight loss encouraged, compliance with goal of at least 4-6 hrs every night is the expectation. Advised against medications with sedative side effects Cautioned against driving when sleepy - understanding that sleepiness will vary on a day to day basis

## 2022-11-19 NOTE — Patient Instructions (Signed)
CPAP is working well. CPAP supplies will be renewed for a year

## 2022-11-20 DIAGNOSIS — M546 Pain in thoracic spine: Secondary | ICD-10-CM | POA: Diagnosis not present

## 2022-11-25 DIAGNOSIS — M546 Pain in thoracic spine: Secondary | ICD-10-CM | POA: Diagnosis not present

## 2022-12-04 DIAGNOSIS — M546 Pain in thoracic spine: Secondary | ICD-10-CM | POA: Diagnosis not present

## 2022-12-12 DIAGNOSIS — R972 Elevated prostate specific antigen [PSA]: Secondary | ICD-10-CM | POA: Diagnosis not present

## 2022-12-13 ENCOUNTER — Telehealth: Payer: Self-pay

## 2022-12-13 NOTE — Telephone Encounter (Signed)
Called patient to schedule appointment for welcome to medicare but there was no answer, LM asking him to call back and schedule.

## 2022-12-18 DIAGNOSIS — M546 Pain in thoracic spine: Secondary | ICD-10-CM | POA: Diagnosis not present

## 2022-12-19 DIAGNOSIS — N400 Enlarged prostate without lower urinary tract symptoms: Secondary | ICD-10-CM | POA: Diagnosis not present

## 2022-12-19 DIAGNOSIS — R972 Elevated prostate specific antigen [PSA]: Secondary | ICD-10-CM | POA: Diagnosis not present

## 2022-12-19 DIAGNOSIS — N281 Cyst of kidney, acquired: Secondary | ICD-10-CM | POA: Diagnosis not present

## 2022-12-19 DIAGNOSIS — N5201 Erectile dysfunction due to arterial insufficiency: Secondary | ICD-10-CM | POA: Diagnosis not present

## 2022-12-21 ENCOUNTER — Other Ambulatory Visit: Payer: Self-pay | Admitting: Urology

## 2022-12-21 DIAGNOSIS — R972 Elevated prostate specific antigen [PSA]: Secondary | ICD-10-CM

## 2022-12-26 ENCOUNTER — Other Ambulatory Visit: Payer: Self-pay | Admitting: Family Medicine

## 2022-12-26 DIAGNOSIS — I10 Essential (primary) hypertension: Secondary | ICD-10-CM

## 2023-01-01 DIAGNOSIS — M546 Pain in thoracic spine: Secondary | ICD-10-CM | POA: Diagnosis not present

## 2023-01-25 ENCOUNTER — Encounter: Payer: Self-pay | Admitting: Family Medicine

## 2023-01-25 ENCOUNTER — Ambulatory Visit (INDEPENDENT_AMBULATORY_CARE_PROVIDER_SITE_OTHER): Payer: PPO | Admitting: Family Medicine

## 2023-01-25 VITALS — BP 138/62 | HR 55 | Temp 97.5°F | Wt 207.2 lb

## 2023-01-25 DIAGNOSIS — M546 Pain in thoracic spine: Secondary | ICD-10-CM | POA: Diagnosis not present

## 2023-01-25 MED ORDER — METHOCARBAMOL 500 MG PO TABS: 500.0000 mg | ORAL_TABLET | Freq: Three times a day (TID) | ORAL | 1 refills | Status: DC | PRN

## 2023-01-25 NOTE — Progress Notes (Signed)
Established Patient Office Visit  Subjective   [name], male    DOB: 02/27/1955  Age: 68 y.o. MRN: [id number]  Chief Complaint  Patient presents with   Fall    HPI   [name] is here with left sided pain.  He was in [place] on the 15th and got up early 1 morning was still dark outside and was leaving his air B&B when he tripped over some uneven sidewalk.  He landed on his left side.  No head injury.  He felt a pulling sensation left chest wall.  No dyspnea.  No cough.  Not sore to touch.  More painful with certain movements.  No pain with inhalation.  He had some leftover tramadol that he took couple nights which seemed to help and also some methocarbamol which she feels like helped.  He thinks this feels more musculoskeletal.  He has some mild bruising of the left forearm otherwise no injuries reported.  Past Medical History:  Diagnosis Date   Alcohol use disorder, mild, abuse    Anemia    Anxiety    situational   Arthritis    Phreesia 02/21/2020   Asthma    Phreesia 02/21/2020   BPH (benign prostatic hyperplasia)    on flomax- Patien tand wife denies- and patient has never been on Flomax   Bundle branch block    per prior PCP records   CHF (congestive heart failure) (HCC)    Chronic kidney disease    Acute Renal Failure- June 2021- admission- kidneys recovered quickly   Complication of anesthesia 03/11/2019   after sx, woke up, but was paralyzed   COPD (chronic obstructive pulmonary disease) (HCC)    Diverticulosis    by colonoscopy   Eczema    per prior PCP records   Elevated PSA 2015   peaked 6s, s/p benign biopsy 2014, sees urology Hal Morales ([place])   Essential tremor    GERD (gastroesophageal reflux disease)    per prior PCP records   History of panic attacks    per prior PCP records   HTN (hypertension)    Hypertension    Phreesia 02/21/2020   Mild intermittent asthma in adult without complication    Obesity, Class I, BMI 30-34.9     Osteoporosis    DEXA 06/2013 with osteopenia - h/o ?wrist/hip fracture from AVN from chronic steroid use (asthma), took reclast for 1 year   Pneumonia    10/08/19 >10 years ago   Seasonal allergies    Sleep apnea    Substance abuse (HCC)    Phreesia 02/21/2020   Thoracic aortic aneurysm [facility])    Thoracic scoliosis childhood   Transaminitis    per prior PCP records   Vitamin B 12 deficiency    Past Surgical History:  Procedure Laterality Date   APPLICATION OF WOUND VAC Left 05/09/2018   Procedure: Application Of Wound Vac;  Surgeon: Nadara Mustard, MD;  Location: [facility] OR;  Service: Orthopedics;  Laterality: Left;   BIOPSY  04/30/2019   Procedure: BIOPSY;  Surgeon: Charna Elizabeth, MD;  Location: [facility];  Service: Endoscopy;;   BROW LIFT Bilateral 04/12/2021   Procedure: UPPER LID BLEPHAROPLASTY;  Surgeon: Peggye Form, DO;  Location: [facility];  Service: Plastics;  Laterality: Bilateral;   COLONOSCOPY  12/2013   polyps, int hem, diverticulosis, rpt 5 yrs [address])   COLONOSCOPY WITH PROPOFOL N/A 04/30/2019   Procedure: COLONOSCOPY WITH PROPOFOL;  Surgeon: Charna Elizabeth, MD;  Location: [facility];  Service: Endoscopy;  Laterality: N/A;   ELBOW SURGERY Right 2008   golfer's elbow   ESOPHAGOGASTRODUODENOSCOPY (EGD) WITH PROPOFOL N/A 04/30/2019   Procedure: ESOPHAGOGASTRODUODENOSCOPY (EGD) WITH PROPOFOL;  Surgeon: Charna Elizabeth, MD;  Location: [facility];  Service: Endoscopy;  Laterality: N/A;   HERNIA REPAIR N/A    Phreesia 02/21/2020   INCISIONAL HERNIA REPAIR N/A 10/15/2019   Procedure: HERNIA REPAIR INCISIONAL WITH MESH, TAR;  Surgeon: Axel Filler, MD;  Location: [facility] OR;  Service: General;  Laterality: N/A;   INGUINAL HERNIA REPAIR Bilateral childhood & ~1995   LAPAROTOMY N/A 01/05/2019   Procedure: EXPLORATORY LAPAROTOMY graham patch repair;  Surgeon: Sung Amabile, DO;  Location: [facility] ORS;  Service: General;  Laterality: N/A;   LAPAROTOMY N/A 10/15/2019    Procedure: EXPLORATORY LAPAROTOMY;  Surgeon: Axel Filler, MD;  Location: [facility] OR;  Service: General;  Laterality: N/A;   LYSIS OF ADHESION N/A 10/15/2019   Procedure: LYSIS OF ADHESION;  Surgeon: Axel Filler, MD;  Location: [facility] OR;  Service: General;  Laterality: N/A;   NASAL SEPTUM SURGERY  2013   deviated septum   ORIF ANKLE FRACTURE Left 05/09/2018   Procedure: OPEN REDUCTION INTERNAL FIXATION LEFT LISFRANC FRACTURE/DISLOCATION;  Surgeon: Nadara Mustard, MD;  Location: [facility] OR;  Service: Orthopedics;  Laterality: Left;   POLYPECTOMY  04/30/2019   Procedure: POLYPECTOMY;  Surgeon: Charna Elizabeth, MD;  Location: [facility];  Service: Endoscopy;;   SHOULDER ARTHROSCOPY Left 05/31/2022   Procedure: ARTHROSCOPY SHOULDER DEBRIDEMENT;  Surgeon: Bjorn Pippin, MD;  Location: [facility];  Service: Orthopedics;  Laterality: Left;   SHOULDER ARTHROSCOPY WITH BANKART REPAIR Left 05/31/2022   Procedure: SHOULDER ARTHROSCOPY WITH BANKART REPAIR;  Surgeon: Bjorn Pippin, MD;  Location: [facility];  Service: Orthopedics;  Laterality: Left;   TEE WITHOUT CARDIOVERSION N/A 06/29/2019   Procedure: TRANSESOPHAGEAL ECHOCARDIOGRAM (TEE);  Surgeon: Elease Hashimoto Deloris Ping, MD;  Location: [facility] END[facility]COPY;  Service: Cardiovascular;  Laterality: N/A;   US ECHOCARDIOGRAPHY  02/2009   WNL, EF >55% [facility])   US RENAL/AORTA Right 05/2009   1-59% diameter reduction renal artery, rec rpt 2 yrs   VASECTOMY N/A    Phreesia 02/21/2020   WEIL OSTEOTOMY Left 11/16/2020   Procedure: WEIL OSTEOTOMY LEFT 2ND METATARSAL,  2ND TOE DISTAL INTERPHALANGEAL RESECTION AND Quintella Reichert OSTEOTOMY WEIL OSTEOTOMY THIRD METATARSAL;  Surgeon: Nadara Mustard, MD;  Location: [facility] OR;  Service: Orthopedics;  Laterality: Left;    reports that he has never smoked. He has never used smokeless tobacco. He reports that he does not currently use alcohol. He reports that he does not use drugs. family history includes Alcohol abuse in his  mother; Ataxia in his brother and sister; Cancer in his maternal grandmother; Cancer (age of onset: 58) in his father; Diabetes in his mother; Prostate cancer in his father; Stroke in his mother. Allergies  Allergen Reactions   Accupril [Quinapril Hcl] Cough   Nsaids     Perforated gastric ulcer   Quinolones Other (See Comments)    Hx of thoracic aortic aneurysm. Avoid quinolones.     Review of Systems  Respiratory:  Negative for cough, hemoptysis and shortness of breath.   Cardiovascular:  Negative for chest pain.  Neurological:  Negative for dizziness and headaches.      Objective:     BP 138/62 (BP Location: Left Arm, Patient Position: Sitting, Cuff Size: Normal)   Pulse (!) 55   Temp (!) 97.5 F (36.4 C) (Oral)  Wt 207 lb 3.2 oz (94 kg)   SpO2 99%   BMI 31.50 kg/m  BP Readings from Last 3 Encounters:  01/25/23 138/62  11/19/22 126/72  11/05/22 116/73   Wt Readings from Last 3 Encounters:  01/25/23 207 lb 3.2 oz (94 kg)  11/19/22 200 lb (90.7 kg)  11/05/22 200 lb (90.7 kg)      Physical Exam Vitals reviewed.  Constitutional:      General: He is not in acute distress.    Appearance: He is not ill-appearing.  Cardiovascular:     Rate and Rhythm: Normal rate and regular rhythm.  Pulmonary:     Effort: Pulmonary effort is normal.     Breath sounds: Normal breath sounds. No wheezing or rales.  Musculoskeletal:     Comments: He has fairly severe scoliosis.  No localized bony tenderness in the left thoracic spine or posterior rib cage area.  No visible bruising.  Neurological:     Mental Status: He is alert.      No results found for any visits on 01/25/23.    The 10-year ASCVD risk score (Arnett DK, et al., 2019) is: 15.9%    Assessment & Plan:   Left-sided chest wall pain.  This is really more in the thoracic back region.  Doubt rib fracture.  No localized bony tenderness.  -Refill Robaxin 500 mg 1 every 8 hours as needed -Supplement with Tylenol  as needed -Follow-up for any persistent or worsening pain. Evelena Peat, MD

## 2023-01-25 NOTE — Telephone Encounter (Signed)
Noted - agree with decision for appt to eval. Thanks.

## 2023-01-28 ENCOUNTER — Ambulatory Visit: Payer: PPO | Admitting: Family Medicine

## 2023-01-29 DIAGNOSIS — M546 Pain in thoracic spine: Secondary | ICD-10-CM | POA: Diagnosis not present

## 2023-02-06 ENCOUNTER — Ambulatory Visit
Admission: RE | Admit: 2023-02-06 | Discharge: 2023-02-06 | Disposition: A | Discharge status: Home / Self Care | Payer: PPO | Source: Ambulatory Visit | Attending: Urology | Admitting: Urology

## 2023-02-06 DIAGNOSIS — R972 Elevated prostate specific antigen [PSA]: Secondary | ICD-10-CM | POA: Diagnosis not present

## 2023-02-06 MED ORDER — GADOPICLENOL 0.5 MMOL/ML IV SOLN
9.0000 mL | Freq: Once | INTRAVENOUS | Status: AC | PRN
  Administered 2023-02-06: 9 mL via INTRAVENOUS

## 2023-02-12 DIAGNOSIS — M546 Pain in thoracic spine: Secondary | ICD-10-CM | POA: Diagnosis not present

## 2023-02-15 ENCOUNTER — Ambulatory Visit: Payer: PPO | Admitting: Family Medicine

## 2023-02-28 DIAGNOSIS — M546 Pain in thoracic spine: Secondary | ICD-10-CM | POA: Diagnosis not present

## 2023-03-04 DIAGNOSIS — R972 Elevated prostate specific antigen [PSA]: Secondary | ICD-10-CM | POA: Diagnosis not present

## 2023-03-04 DIAGNOSIS — N411 Chronic prostatitis: Secondary | ICD-10-CM | POA: Diagnosis not present

## 2023-03-04 DIAGNOSIS — N410 Acute prostatitis: Secondary | ICD-10-CM | POA: Diagnosis not present

## 2023-03-19 DIAGNOSIS — M546 Pain in thoracic spine: Secondary | ICD-10-CM | POA: Diagnosis not present

## 2023-04-01 DIAGNOSIS — M546 Pain in thoracic spine: Secondary | ICD-10-CM | POA: Diagnosis not present

## 2023-04-10 DIAGNOSIS — D225 Melanocytic nevi of trunk: Secondary | ICD-10-CM | POA: Diagnosis not present

## 2023-04-10 DIAGNOSIS — L728 Other follicular cysts of the skin and subcutaneous tissue: Secondary | ICD-10-CM | POA: Diagnosis not present

## 2023-04-10 DIAGNOSIS — L814 Other melanin hyperpigmentation: Secondary | ICD-10-CM | POA: Diagnosis not present

## 2023-04-10 DIAGNOSIS — L821 Other seborrheic keratosis: Secondary | ICD-10-CM | POA: Diagnosis not present

## 2023-04-10 DIAGNOSIS — Z7189 Other specified counseling: Secondary | ICD-10-CM | POA: Diagnosis not present

## 2023-04-10 DIAGNOSIS — D294 Benign neoplasm of scrotum: Secondary | ICD-10-CM | POA: Diagnosis not present

## 2023-04-15 DIAGNOSIS — M546 Pain in thoracic spine: Secondary | ICD-10-CM | POA: Diagnosis not present

## 2023-04-30 DIAGNOSIS — M546 Pain in thoracic spine: Secondary | ICD-10-CM | POA: Diagnosis not present

## 2023-05-14 DIAGNOSIS — M546 Pain in thoracic spine: Secondary | ICD-10-CM | POA: Diagnosis not present

## 2023-06-03 ENCOUNTER — Encounter: Admitting: Orthopedic Surgery

## 2023-06-04 ENCOUNTER — Other Ambulatory Visit (INDEPENDENT_AMBULATORY_CARE_PROVIDER_SITE_OTHER): Payer: Self-pay

## 2023-06-04 ENCOUNTER — Ambulatory Visit: Admitting: Orthopaedic Surgery

## 2023-06-04 DIAGNOSIS — M25571 Pain in right ankle and joints of right foot: Secondary | ICD-10-CM | POA: Insufficient documentation

## 2023-06-04 NOTE — Progress Notes (Signed)
 Office Visit Note   Patient: Louis [name]           Date of Birth: [birth year]           MRN: [id number] Visit Date: 06/04/2023              Requested by: Benjiman Bras, MD [address] [place],  [place] [address] PCP: Benjiman Bras, MD   Assessment & Plan: Visit Diagnoses:  1. Pain in right ankle and joints of right foot     Plan: History of Present Illness Keyshaun Exley is a 68 year old male with a history of flat feet who presents with progressive right ankle tightness and pain.  Since mid-last month, he has experienced progressive tightness in the right ankle. The pain initially localized to the outer tendon but has since shifted location. Despite discomfort, he continues normal activities, including golf, which temporarily alleviates pain.  He remains active, participating in fly fishing, moving boxes, doing plies, and cycling. Pain is minimal, does not disturb sleep, and is not constant. There is no swelling.  He occasionally takes Tylenol  for pain relief. He wore a fabric brace this morning, but its effect on irritation is unclear.  Physical Exam EXTREMITIES: No swelling in the ankle. Mild tenderness distal to the lateral malleolus. Pes planus (flat feet) present.  Too many toes sign.  Assessment and Plan Peroneal tendonitis of the ankle Progressive tightness and minimal pain in the right ankle due to exertion and abnormal foot mechanics from pes planus. X-rays show no fractures or acute abnormalities. - Advise use of a wrap-around brace during travel and activities involving extensive walking.  Mild ankle degenerative changes Mild degenerative changes with spurring in the medial gutter on ankle x-ray. No intervention required.  Follow-Up Instructions: No follow-ups on file.   Orders:  Orders Placed This Encounter  Procedures   XR Ankle Complete Right   XR Foot Complete Right   No orders of the defined types were placed in this  encounter.      Subjective: Chief Complaint  Patient presents with   Right Ankle - Pain    HPI  Review of Systems  Constitutional: Negative.   HENT: Negative.    Eyes: Negative.   Respiratory: Negative.    Cardiovascular: Negative.   Gastrointestinal: Negative.   Endocrine: Negative.   Genitourinary: Negative.   Skin: Negative.   Allergic/Immunologic: Negative.   Neurological: Negative.   Hematological: Negative.   Psychiatric/Behavioral: Negative.    All other systems reviewed and are negative.    Objective: Vital Signs: There were no vitals taken for this visit.  Physical Exam Vitals and nursing note reviewed.  Constitutional:      Appearance: He is well-developed.  HENT:     Head: Normocephalic and atraumatic.  Eyes:     Pupils: Pupils are equal, round, and reactive to light.  Pulmonary:     Effort: Pulmonary effort is normal.  Abdominal:     Palpations: Abdomen is soft.  Musculoskeletal:        General: Normal range of motion.     Cervical back: Neck supple.  Skin:    General: Skin is warm.  Neurological:     Mental Status: He is alert and oriented to person, place, and time.  Psychiatric:        Behavior: Behavior normal.        Thought Content: Thought content normal.        Judgment:  Judgment normal.     Ortho Exam  Specialty Comments:  No specialty comments available.  Imaging: XR Foot Complete Right Result Date: 06/04/2023 No acute or structural abnormalities.  XR Ankle Complete Right Result Date: 06/04/2023 X-rays of the right ankle show no acute findings.    PMFS History: Patient Active Problem List   Diagnosis Date Noted   Pain in right ankle and joints of right foot 06/04/2023   Thoracic aortic aneurysm without rupture (HCC) 10/10/2021   Dermatochalasis 02/10/2021   Ectropion 02/10/2021   Claw toe, acquired, left    Bunion of great toe of left foot    Skin lesion of face 06/14/2020   Unilateral primary osteoarthritis,  right knee 01/06/2020   S/P hernia repair 10/15/2019   Neuropathy 07/20/2019   Macrocytic anemia    Debility 07/07/2019   Atelectasis    Enterococcal bacteremia 06/26/2019   Perforated viscus 01/06/2019   Onychomycosis 05/19/2018   Foot fracture, left, closed, initial encounter 05/09/2018   Lisfranc dislocation, left, initial encounter    Primary pulmonary hypertension (HCC) 07/09/2017   Diastolic dysfunction 07/08/2017   OSA (obstructive sleep apnea) 07/08/2017   SOB (shortness of breath) 07/02/2017   Chronic respiratory failure with hypoxia and hypercapnia (HCC) 07/02/2017   Cardiomegaly 07/02/2017   Exposure keratitis 02/17/2017   Pedal edema 12/18/2016   Transaminitis 12/18/2016   Bilateral pes planus 12/18/2016   Psoriasis-eczema overlap condition 07/30/2016   High serum high density lipoprotein (HDL) 09/26/2015   Epistaxis 09/26/2015   Renal artery stenosis in 1 of 2 vessels (HCC)    Essential tremor    Elevated PSA    Obesity, Class I, BMI 30-34.9    Mild intermittent asthma in adult without complication    Seasonal allergies    HTN (hypertension)    Scoliosis    Past Medical History:  Diagnosis Date   Alcohol  use disorder, mild, abuse    Anemia    Anxiety    situational   Arthritis    Phreesia 02/21/2020   Asthma    Phreesia 02/21/2020   BPH (benign prostatic hyperplasia)    on flomax- Patien tand wife denies- and patient has never been on Flomax   Bundle branch block    per prior PCP records   CHF (congestive heart failure) (HCC)    Chronic kidney disease    Acute Renal Failure- June 2021- admission- kidneys recovered quickly   Complication of anesthesia 03/11/2019   after sx, woke up, but was paralyzed   COPD (chronic obstructive pulmonary disease) (HCC)    Diverticulosis    by colonoscopy   Eczema    per prior PCP records   Elevated PSA 2015   peaked 6s, s/p benign biopsy 2014, sees urology Phoebe Breed ([place])   Essential tremor    GERD  (gastroesophageal reflux disease)    per prior PCP records   History of panic attacks    per prior PCP records   HTN (hypertension)    Hypertension    Phreesia 02/21/2020   Mild intermittent asthma in adult without complication    Obesity, Class I, BMI 30-34.9    Osteoporosis    DEXA 06/2013 with osteopenia - h/o ?wrist/hip fracture from AVN from chronic steroid use (asthma), took reclast for 1 year   Pneumonia    10/08/19 >10 years ago   Seasonal allergies    Sleep apnea    Substance abuse (HCC)    Phreesia 02/21/2020   Thoracic aortic aneurysm (HCC)  Thoracic scoliosis childhood   Transaminitis    per prior PCP records   Vitamin B 12 deficiency     Family History  Problem Relation Age of Onset   Cancer Father 69       colon   Prostate cancer Father    Diabetes Mother    Alcohol  abuse Mother    Stroke Mother    Cancer Maternal Grandmother        pancreatic   Ataxia Brother    Ataxia Sister    CAD Neg Hx     Past Surgical History:  Procedure Laterality Date   APPLICATION OF WOUND VAC Left 05/09/2018   Procedure: Application Of Wound Vac;  Surgeon: Timothy Ford, MD;  [facility] OR;  Service: Orthopedics;  Laterality: Left;   BIOPSY  04/30/2019   Procedure: BIOPSY;  Surgeon: Tami Falcon, MD;  Location: [facility];  Service: Endoscopy;;   BROW LIFT Bilateral 04/12/2021   Procedure: UPPER LID BLEPHAROPLASTY;  Surgeon: Thornell Flirt, DO;  Location: [facility];  Service: Plastics;  Laterality: Bilateral;   COLONOSCOPY  12/2013   polyps, int hem, diverticulosis, rpt 5 yrs Tova Fresh)   COLONOSCOPY WITH PROPOFOL  N/A 04/30/2019   Procedure: COLONOSCOPY WITH PROPOFOL ;  Surgeon: Tami Falcon, MD;  Location: [facility];  Service: Endoscopy;  Laterality: N/A;   ELBOW SURGERY Right 2008   golfer's elbow   ESOPHAGOGASTRODUODENOSCOPY (EGD) WITH PROPOFOL  N/A 04/30/2019   Procedure: ESOPHAGOGASTRODUODENOSCOPY (EGD) WITH PROPOFOL ;  Surgeon: Tami Falcon, MD;   Location: [facility];  Service: Endoscopy;  Laterality: N/A;   HERNIA REPAIR N/A    Phreesia 02/21/2020   INCISIONAL HERNIA REPAIR N/A 10/15/2019   Procedure: HERNIA REPAIR INCISIONAL WITH MESH, TAR;  Surgeon: Shela Derby, MD;  Location: [facility] OR;  Service: General;  Laterality: N/A;   INGUINAL HERNIA REPAIR Bilateral childhood & ~1995   LAPAROTOMY N/A 01/05/2019   Procedure: EXPLORATORY LAPAROTOMY graham patch repair;  Surgeon: Conrado Delay, DO;  Location: [facility] ORS;  Service: General;  Laterality: N/A;   LAPAROTOMY N/A 10/15/2019   Procedure: EXPLORATORY LAPAROTOMY;  Surgeon: Shela Derby, MD;  Location: [facility] OR;  Service: General;  Laterality: N/A;   LYSIS OF ADHESION N/A 10/15/2019   Procedure: LYSIS OF ADHESION;  Surgeon: Shela Derby, MD;  Location: [facility] OR;  Service: General;  Laterality: N/A;   NASAL SEPTUM SURGERY  2013   deviated septum   ORIF ANKLE FRACTURE Left 05/09/2018   Procedure: OPEN REDUCTION INTERNAL FIXATION LEFT LISFRANC FRACTURE/DISLOCATION;  Surgeon: Timothy Ford, MD;  Location: [facility] OR;  Service: Orthopedics;  Laterality: Left;   POLYPECTOMY  04/30/2019   Procedure: POLYPECTOMY;  Surgeon: Tami Falcon, MD;  Location: [facility];  Service: Endoscopy;;   SHOULDER ARTHROSCOPY Left 05/31/2022   Procedure: ARTHROSCOPY SHOULDER DEBRIDEMENT;  Surgeon: Micheline Ahr, MD;  Location: [facility];  Service: Orthopedics;  Laterality: Left;   SHOULDER ARTHROSCOPY WITH BANKART REPAIR Left 05/31/2022   Procedure: SHOULDER ARTHROSCOPY WITH BANKART REPAIR;  Surgeon: Micheline Ahr, MD;  Location: [facility];  Service: Orthopedics;  Laterality: Left;   TEE WITHOUT CARDIOVERSION N/A 06/29/2019   Procedure: TRANSESOPHAGEAL ECHOCARDIOGRAM (TEE);  Surgeon: Alroy Aspen Lela Purple, MD;  Location: [facility]COPY;  Service: Cardiovascular;  Laterality: N/A;   US  ECHOCARDIOGRAPHY  02/2009   WNL, EF >55% Loetta Ringer)   US  RENAL/AORTA Right 05/2009   1-59% diameter reduction  renal artery, rec rpt 2 yrs   VASECTOMY N/A    Phreesia 02/21/2020  WEIL OSTEOTOMY Left 11/16/2020   Procedure: WEIL OSTEOTOMY LEFT 2ND METATARSAL,  2ND TOE DISTAL INTERPHALANGEAL RESECTION AND Candida Chalk OSTEOTOMY WEIL OSTEOTOMY THIRD METATARSAL;  Surgeon: Timothy Ford, MD;  Location: [facility] OR;  Service: Orthopedics;  Laterality: Left;   Social History   Occupational History   Not on file  Tobacco Use   Smoking status: Never   Smokeless tobacco: Never  Vaping Use   Vaping status: Never Used  Substance and Sexual Activity   Alcohol  use: Not Currently   Drug use: No   Sexual activity: Yes    Birth control/protection: None

## 2023-06-11 ENCOUNTER — Other Ambulatory Visit: Payer: Self-pay | Admitting: Family Medicine

## 2023-06-11 DIAGNOSIS — E785 Hyperlipidemia, unspecified: Secondary | ICD-10-CM

## 2023-06-12 ENCOUNTER — Telehealth: Payer: Self-pay

## 2023-06-12 DIAGNOSIS — M546 Pain in thoracic spine: Secondary | ICD-10-CM | POA: Diagnosis not present

## 2023-06-12 NOTE — Telephone Encounter (Signed)
 Called to make an appointment patient last seen August 2024 and was requested to follow up 6 months, (February) no appt scheduled. Will send refill once appt is made

## 2023-06-12 NOTE — Telephone Encounter (Signed)
 Copied from CRM [id number]. Topic: General - Other >> Jun 12, 2023 10:40 AM Marlan Silva wrote: Reason for CRM: Patient called in stating that he had a missed call from [facility], no documentation is in chart. Patient requesting a call back to [phone].

## 2023-06-13 DIAGNOSIS — R972 Elevated prostate specific antigen [PSA]: Secondary | ICD-10-CM | POA: Diagnosis not present

## 2023-06-20 DIAGNOSIS — R972 Elevated prostate specific antigen [PSA]: Secondary | ICD-10-CM | POA: Diagnosis not present

## 2023-06-20 DIAGNOSIS — N400 Enlarged prostate without lower urinary tract symptoms: Secondary | ICD-10-CM | POA: Diagnosis not present

## 2023-06-27 ENCOUNTER — Ambulatory Visit: Admitting: Family Medicine

## 2023-06-27 VITALS — BP 114/60 | HR 53 | Temp 98.5°F | Resp 16 | Ht 68.0 in | Wt 204.8 lb

## 2023-06-27 DIAGNOSIS — Z860100 Personal history of colon polyps, unspecified: Secondary | ICD-10-CM | POA: Insufficient documentation

## 2023-06-27 DIAGNOSIS — Z6828 Body mass index (BMI) 28.0-28.9, adult: Secondary | ICD-10-CM | POA: Insufficient documentation

## 2023-06-27 DIAGNOSIS — E538 Deficiency of other specified B group vitamins: Secondary | ICD-10-CM | POA: Diagnosis not present

## 2023-06-27 DIAGNOSIS — Z23 Encounter for immunization: Secondary | ICD-10-CM

## 2023-06-27 DIAGNOSIS — I10 Essential (primary) hypertension: Secondary | ICD-10-CM | POA: Diagnosis not present

## 2023-06-27 DIAGNOSIS — M545 Low back pain, unspecified: Secondary | ICD-10-CM

## 2023-06-27 DIAGNOSIS — E785 Hyperlipidemia, unspecified: Secondary | ICD-10-CM

## 2023-06-27 DIAGNOSIS — Z800 Family history of malignant neoplasm of digestive organs: Secondary | ICD-10-CM | POA: Insufficient documentation

## 2023-06-27 DIAGNOSIS — G629 Polyneuropathy, unspecified: Secondary | ICD-10-CM

## 2023-06-27 DIAGNOSIS — K5730 Diverticulosis of large intestine without perforation or abscess without bleeding: Secondary | ICD-10-CM | POA: Insufficient documentation

## 2023-06-27 MED ORDER — GABAPENTIN 300 MG PO CAPS: 300.0000 mg | ORAL_CAPSULE | Freq: Three times a day (TID) | ORAL | 1 refills | Status: DC

## 2023-06-27 MED ORDER — ATORVASTATIN CALCIUM 10 MG PO TABS
ORAL_TABLET | ORAL | 1 refills | Status: AC
Start: 2023-06-27 — End: ?

## 2023-06-27 MED ORDER — AMLODIPINE BESYLATE 10 MG PO TABS: ORAL_TABLET | ORAL | 2 refills | Status: DC

## 2023-06-27 MED ORDER — LOSARTAN POTASSIUM-HCTZ 100-12.5 MG PO TABS
1.0000 | ORAL_TABLET | Freq: Every day | ORAL | 2 refills | Status: AC
Start: 2023-06-27 — End: ?

## 2023-06-27 MED ORDER — METHOCARBAMOL 500 MG PO TABS
500.0000 mg | ORAL_TABLET | Freq: Three times a day (TID) | ORAL | 1 refills | Status: DC | PRN
Start: 2023-06-27 — End: 2023-07-16

## 2023-06-27 MED ORDER — METOPROLOL SUCCINATE ER 25 MG PO TB24: 25.0000 mg | ORAL_TABLET | Freq: Every day | ORAL | 1 refills | Status: DC

## 2023-06-27 NOTE — Patient Instructions (Addendum)
 Try decreasing toprol  to 25mg  per day - blood pressure on the lower side today.  No other med changes at this time.  I did send in some Robaxin  if needed for low back pain, see information below.  Continue gentle range of motion with stretches but follow-up if that pain does not continue to improve or any worsening symptoms.  Please have labs performed at the [facility] location in the next week if possible.  If any concerns on labs I will let you know. 80-month follow-up for a physical, sooner if any new or worsening symptoms.  Take care!  [facility] or xray: Walk in 8:30-4:30 during weekdays, no appointment needed [address]  [place], [place] [address]   Acute Back Pain, Adult Acute back pain is sudden and usually short-lived. It is often caused by an injury to the muscles and tissues in the back. The injury may result from: A muscle, tendon, or ligament getting overstretched or torn. Ligaments are tissues that connect bones to each other. Lifting something improperly can cause a back strain. Wear and tear (degeneration) of the spinal disks. Spinal disks are circular tissue that provide cushioning between the bones of the spine (vertebrae). Twisting motions, such as while playing sports or doing yard work. A hit to the back. Arthritis. You may have a physical exam, lab tests, and imaging tests to find the cause of your pain. Acute back pain usually goes away with rest and home care. Follow these instructions at home: Managing pain, stiffness, and swelling Take over-the-counter and prescription medicines only as told by your health care provider. Treatment may include medicines for pain and inflammation that are taken by mouth or applied to the skin, or muscle relaxants. Your health care provider may recommend applying ice during the first 24-48 hours after your pain starts. To do this: Put ice in a plastic bag. Place a towel between your skin and the bag. Leave the ice on for 20 minutes, 2-3  times a day. Remove the ice if your skin turns bright red. This is very important. If you cannot feel pain, heat, or cold, you have a greater risk of damage to the area. If directed, apply heat to the affected area as often as told by your health care provider. Use the heat source that your health care provider recommends, such as a moist heat pack or a heating pad. Place a towel between your skin and the heat source. Leave the heat on for 20-30 minutes. Remove the heat if your skin turns bright red. This is especially important if you are unable to feel pain, heat, or cold. You have a greater risk of getting burned. Activity  Do not stay in bed. Staying in bed for more than 1-2 days can delay your recovery. Sit up and stand up straight. Avoid leaning forward when you sit or hunching over when you stand. If you work at a desk, sit close to it so you do not need to lean over. Keep your chin tucked in. Keep your neck drawn back, and keep your elbows bent at a 90-degree angle (right angle). Sit high and close to the steering wheel when you drive. Add lower back (lumbar) support to your car seat, if needed. Take short walks on even surfaces as soon as you are able. Try to increase the length of time you walk each day. Do not sit, drive, or stand in one place for more than 30 minutes at a time. Sitting or  standing for long periods of time can put stress on your back. Do not drive or use heavy machinery while taking prescription pain medicine. Use proper lifting techniques. When you bend and lift, use positions that put less stress on your back: [place] your knees. Keep the load close to your body. Avoid twisting. Exercise regularly as told by your health care provider. Exercising helps your back heal faster and helps prevent back injuries by keeping muscles strong and flexible. Work with a physical therapist to make a safe exercise program, as recommended by your health care provider. Do any exercises as  told by your physical therapist. Lifestyle Maintain a healthy weight. Extra weight puts stress on your back and makes it difficult to have good posture. Avoid activities or situations that make you feel anxious or stressed. Stress and anxiety increase muscle tension and can make back pain worse. Learn ways to manage anxiety and stress, such as through exercise. General instructions Sleep on a firm mattress in a comfortable position. Try lying on your side with your knees slightly bent. If you lie on your back, put a pillow under your knees. Keep your head and neck in a straight line with your spine (neutral position) when using electronic equipment like smartphones or pads. To do this: Raise your smartphone or pad to look at it instead of bending your head or neck to look down. Put the smartphone or pad at the level of your face while looking at the screen. Follow your treatment plan as told by your health care provider. This may include: Cognitive or behavioral therapy. Acupuncture or massage therapy. Meditation or yoga. Contact a health care provider if: You have pain that is not relieved with rest or medicine. You have increasing pain going down into your legs or buttocks. Your pain does not improve after 2 weeks. You have pain at night. You lose weight without trying. You have a fever or chills. You develop nausea or vomiting. You develop abdominal pain. Get help right away if: You develop new bowel or bladder control problems. You have unusual weakness or numbness in your arms or legs. You feel faint. These symptoms may represent a serious problem that is an emergency. Do not wait to see if the symptoms will go away. Get medical help right away. Call your local emergency services (911 in the [place] Do not drive yourself to the hospital. Summary Acute back pain is sudden and usually short-lived. Use proper lifting techniques. When you bend and lift, use positions that put less stress  on your back. Take over-the-counter and prescription medicines only as told by your health care provider, and apply heat or ice as told. This information is not intended to replace advice given to you by your health care provider. Make sure you discuss any questions you have with your health care provider. Document Revised: 03/11/2020 Document Reviewed: 03/11/2020 Elsevier Patient Education  2024 [employer].

## 2023-06-27 NOTE — Progress Notes (Signed)
 Subjective:  [name], male    DOB: [birth year]  Age: 68 y.o. MRN: [id number]  CC:  Chief Complaint  Patient presents with   Medical Management of Chronic Issues    No concerns about chronic conditions    Back Pain    Pt notes hurt himself during some exercise and is seeing PT for this but wondered if he could get Rx for Methocarbamol  to help with this     HPI Louis [name] presents for   Chronic condition follow-up and back pain as above  Prostate bx was ok - recent visit with Dr. Selma at [facility]. Negative MRI fusion bx in March, 6 month recheck, PSA declining.   Peripheral neuropathy History of lumbar radiculopathy, treated with gabapentin  600 mg in the morning when discussed in August last year.  Still taking 2 in am - managing symptoms, rare dose at night. Stable.   Hyperlipidemia/aortic aneurysm Treated with Lipitor 10 mg twice per week.  History of thoracic aortic aneurysm, followed previously by thoracic surgeon with ongoing monitoring CTA planned. No new myalgias/side effects.  CT 10/23/22 - 4 cm ascending thoracic aortic aneurysm. Recommend annual imaging followup by CTA or MRA. Lab Results  Component Value Date   CHOL 172 08/23/2022   HDL 45.70 08/23/2022   LDLCALC 112 (H) 08/23/2022   TRIG 71.0 08/23/2022   CHOLHDL 4 08/23/2022   Lab Results  Component Value Date   ALT 14 09/10/2022   AST 15 09/10/2022   ALKPHOS 56 09/10/2022   BILITOT 0.9 09/10/2022    Hypertension: With history of OSA on CPAP nightly.  Followed by Dr. Jude.  Feels well rested with use of CPAP.  Metoprolol  50 mg daily, losartan /HCTZ 100/12.5 mg daily and amlodipine  10 mg daily for hypertension. Home readings: similar - 110/60 range. No lightheadedness, dizziness.  Low HR in past - no sx's.  No side effects on meds.  BP Readings from Last 3 Encounters:  06/27/23 114/60  01/25/23 138/62  11/19/22 126/72   Lab Results  Component Value Date   CREATININE  0.80 08/23/2022   Back pain Rib injury in January. Seen by Dr. Micheal on January 24.  Refilled Robaxin  at that time, rib pain improved. PT for scoliosis.  Back sore last Thursday after Pilates. No injury or initial pain - noted later that night. Less sore now, but still some soreness. Deferred pilates today.  No bowel or bladder incontinence, no saddle anesthesia, no lower extremity weakness. No fever/wt. Loss/night sweats. Mid lower back. Min radiation.  Would like rx for Robaxin .  Avoiding nsaids. Prior ulcer disease.  Some benefit with stretches, bike riding.   Health maintenance: Due for pneumonia vaccine, Prevnar 20 given today.  History Patient Active Problem List   Diagnosis Date Noted   BMI 28.0-28.9,adult 06/27/2023   Diverticulosis of colon 06/27/2023   Family history of malignant neoplasm of colon 06/27/2023   History of colonic polyps 06/27/2023   Pain in right ankle and joints of right foot 06/04/2023   Thoracic aortic aneurysm without rupture (HCC) 10/10/2021   Dermatochalasis 02/10/2021   Ectropion 02/10/2021   Claw toe, acquired, left    Bunion of great toe of left foot    Skin lesion of face 06/14/2020   Unilateral primary osteoarthritis, right knee 01/06/2020   S/P hernia repair 10/15/2019   Neuropathy 07/20/2019   Macrocytic anemia    Debility 07/07/2019   Atelectasis    Enterococcal bacteremia 06/26/2019   Perforated  viscus 01/06/2019   Onychomycosis 05/19/2018   Foot fracture, left, closed, initial encounter 05/09/2018   Lisfranc dislocation, left, initial encounter    Primary pulmonary hypertension (HCC) 07/09/2017   Diastolic dysfunction 07/08/2017   OSA (obstructive sleep apnea) 07/08/2017   SOB (shortness of breath) 07/02/2017   Chronic respiratory failure with hypoxia and hypercapnia (HCC) 07/02/2017   Cardiomegaly 07/02/2017   Exposure keratitis 02/17/2017   Pedal edema 12/18/2016   Transaminitis 12/18/2016   Bilateral pes planus 12/18/2016    Psoriasis-eczema overlap condition 07/30/2016   High serum high density lipoprotein (HDL) 09/26/2015   Epistaxis 09/26/2015   Renal artery stenosis in 1 of 2 vessels (HCC)    Essential tremor    Elevated PSA    Obesity, Class I, BMI 30-34.9    Mild intermittent asthma in adult without complication    Seasonal allergies    HTN (hypertension)    Scoliosis    Past Medical History:  Diagnosis Date   Alcohol  use disorder, mild, abuse    Anemia    Anxiety    situational   Arthritis    Phreesia 02/21/2020   Asthma    Phreesia 02/21/2020   BPH (benign prostatic hyperplasia)    on flomax- Patien tand wife denies- and patient has never been on Flomax   Bundle branch block    per prior PCP records   CHF (congestive heart failure) (HCC)    Chronic kidney disease    Acute Renal Failure- June 2021- admission- kidneys recovered quickly   Complication of anesthesia 03/11/2019   after sx, woke up, but was paralyzed   COPD (chronic obstructive pulmonary disease) (HCC)    Diverticulosis    by colonoscopy   Eczema    per prior PCP records   Elevated PSA 2015   peaked 6s, s/p benign biopsy 2014, sees urology [facility] ([place])   Essential tremor    GERD (gastroesophageal reflux disease)    per prior PCP records   History of panic attacks    per prior PCP records   HTN (hypertension)    Hypertension    Phreesia 02/21/2020   Mild intermittent asthma in adult without complication    Obesity, Class I, BMI 30-34.9    Osteoporosis    DEXA 06/2013 with osteopenia - h/o ?wrist/hip fracture from AVN from chronic steroid use (asthma), took reclast for 1 year   Pneumonia    10/08/19 >10 years ago   Seasonal allergies    Sleep apnea    Substance abuse (HCC)    Phreesia 02/21/2020   Thoracic aortic aneurysm [facility])    Thoracic scoliosis childhood   Transaminitis    per prior PCP records   Vitamin B 12 deficiency    Past Surgical History:  Procedure Laterality Date   APPLICATION OF WOUND  VAC Left 05/09/2018   Procedure: Application Of Wound Vac;  Surgeon: Harden Jerona GAILS, MD;  Location: [facility] OR;  Service: Orthopedics;  Laterality: Left;   BIOPSY  04/30/2019   Procedure: BIOPSY;  Surgeon: Kristie Lamprey, MD;  Location: [facility];  Service: Endoscopy;;   BROW LIFT Bilateral 04/12/2021   Procedure: UPPER LID BLEPHAROPLASTY;  Surgeon: Lowery Estefana RAMAN, DO;  Location: [facility];  Service: Plastics;  Laterality: Bilateral;   COLONOSCOPY  12/2013   polyps, int hem, diverticulosis, rpt 5 yrs [facility])   COLONOSCOPY WITH PROPOFOL  N/A 04/30/2019   Procedure: COLONOSCOPY WITH PROPOFOL ;  Surgeon: Kristie Lamprey, MD;  Location: [facility];  Service: Endoscopy;  Laterality:  N/A;   ELBOW SURGERY Right 2008   golfer's elbow   ESOPHAGOGASTRODUODENOSCOPY (EGD) WITH PROPOFOL  N/A 04/30/2019   Procedure: ESOPHAGOGASTRODUODENOSCOPY (EGD) WITH PROPOFOL ;  Surgeon: Kristie Lamprey, MD;  Location: [facility];  Service: Endoscopy;  Laterality: N/A;   HERNIA REPAIR N/A    Phreesia 02/21/2020   INCISIONAL HERNIA REPAIR N/A 10/15/2019   Procedure: HERNIA REPAIR INCISIONAL WITH MESH, TAR;  Surgeon: Rubin Calamity, MD;  Location: [facility] OR;  Service: General;  Laterality: N/A;   INGUINAL HERNIA REPAIR Bilateral childhood & ~1995   LAPAROTOMY N/A 01/05/2019   Procedure: EXPLORATORY LAPAROTOMY graham patch repair;  Surgeon: Tye Millet, DO;  Location: [facility]S;  Service: General;  Laterality: N/A;   LAPAROTOMY N/A 10/15/2019   Procedure: EXPLORATORY LAPAROTOMY;  Surgeon: Rubin Calamity, MD;  Location: [facility] OR;  Service: General;  Laterality: N/A;   LYSIS OF ADHESION N/A 10/15/2019   Procedure: LYSIS OF ADHESION;  Surgeon: Rubin Calamity, MD;  Location: [facility] OR;  Service: General;  Laterality: N/A;   NASAL SEPTUM SURGERY  2013   deviated septum   ORIF ANKLE FRACTURE Left 05/09/2018   Procedure: OPEN REDUCTION INTERNAL FIXATION LEFT LISFRANC FRACTURE/DISLOCATION;  Surgeon: Harden Jerona GAILS, MD;  Location:  [facility] OR;  Service: Orthopedics;  Laterality: Left;   POLYPECTOMY  04/30/2019   Procedure: POLYPECTOMY;  Surgeon: Kristie Lamprey, MD;  Location: [facility];  Service: Endoscopy;;   SHOULDER ARTHROSCOPY Left 05/31/2022   Procedure: ARTHROSCOPY SHOULDER DEBRIDEMENT;  Surgeon: Cristy Bonner DASEN, MD;  Location: [facility];  Service: Orthopedics;  Laterality: Left;   SHOULDER ARTHROSCOPY WITH BANKART REPAIR Left 05/31/2022   Procedure: SHOULDER ARTHROSCOPY WITH BANKART REPAIR;  Surgeon: Cristy Bonner DASEN, MD;  Location:  SURGERY CENTER;  Service: Orthopedics;  Laterality: Left;   TEE WITHOUT CARDIOVERSION N/A 06/29/2019   Procedure: TRANSESOPHAGEAL ECHOCARDIOGRAM (TEE);  Surgeon: Alveta Aleene PARAS, MD;  Location: [facility] END[facility]COPY;  Service: Cardiovascular;  Laterality: N/A;   US  ECHOCARDIOGRAPHY  02/2009   WNL, EF >55% [place])   US  RENAL/AORTA Right 05/2009   1-59% diameter reduction renal artery, rec rpt 2 yrs   VASECTOMY N/A    Phreesia 02/21/2020   WEIL OSTEOTOMY Left 11/16/2020   Procedure: WEIL OSTEOTOMY LEFT 2ND METATARSAL,  2ND TOE DISTAL INTERPHALANGEAL RESECTION AND KATRINA OSTEOTOMY WEIL OSTEOTOMY THIRD METATARSAL;  Surgeon: Harden Jerona GAILS, MD;  Location: [facility] OR;  Service: Orthopedics;  Laterality: Left;   Allergies  Allergen Reactions   Accupril [Quinapril Hcl] Cough   Nsaids     Perforated gastric ulcer   Quinolones Other (See Comments)    Hx of thoracic aortic aneurysm. Avoid quinolones.    Prior to Admission medications   Medication Sig Start Date End Date Taking? Authorizing Provider  albuterol  (VENTOLIN  HFA) 108 (90 Base) MCG/ACT inhaler Inhale 2 puffs into the lungs every 6 (six) hours as needed for wheezing or shortness of breath. 02/23/22  Yes Tabori, Katherine E, MD  amLODipine  (NORVASC ) 10 MG tablet Take one tablet daily 08/15/22  Yes Levora Reyes SAUNDERS, MD  atorvastatin  (LIPITOR) 10 MG tablet TAKE ONE TABLET BY MOUTH 2 TO 3 TIMES PER WEEK 06/12/23  Yes Levora Reyes SAUNDERS, MD   diclofenac Sodium (VOLTAREN) 1 % GEL Apply 1 application topically 4 (four) times daily as needed (knee pain).   Yes [provider]  fluticasone (FLONASE) 50 MCG/ACT nasal spray Place into both nostrils daily.   Yes [provider]  gabapentin  (NEURONTIN ) 300 MG capsule Take 1-2 capsules (300-600 mg total)  by mouth 3 (three) times daily. 09/28/22  Yes Levora Reyes SAUNDERS, MD  losartan -hydrochlorothiazide  (HYZAAR) 100-12.5 MG tablet Take 1 tablet by mouth daily. 08/15/22  Yes Levora Reyes SAUNDERS, MD  methocarbamol  (ROBAXIN ) 500 MG tablet Take 1 tablet (500 mg total) by mouth every 8 (eight) hours as needed for muscle spasms. 01/25/23  Yes Burchette, Wolm ORN, MD  metoprolol  succinate (TOPROL -XL) 50 MG 24 hr tablet TAKE 1 TABLET BY MOUTH DAILY WITH OR IMMEDIATELY FOLLOWING A MEAL 12/27/22  Yes Levora Reyes SAUNDERS, MD  Multiple Vitamin (MULTIVITAMIN WITH MINERALS) TABS tablet Take 1 tablet by mouth daily. 08/02/2019  Yes Dahal, Chapman, MD  olopatadine (PATANOL) 0.1 % ophthalmic solution Place 1 drop into both eyes 2 (two) times daily as needed for allergies.   Yes [provider]  omeprazole  (PRILOSEC) 20 MG capsule Take 20 mg by mouth daily.   Yes [provider]   Social History   Socioeconomic History   Marital status: Married    Spouse name: Not on file   Number of children: Not on file   Years of education: Not on file   Highest education level: Professional school degree (e.g., MD, DDS, DVM, JD)  Occupational History   Not on file  Tobacco Use   Smoking status: Never   Smokeless tobacco: Never  Vaping Use   Vaping status: Never Used  Substance and Sexual Activity   Alcohol  use: Not Currently   Drug use: No   Sexual activity: Yes    Birth control/protection: None  Other Topics Concern   Not on file  Social History Narrative   Lives with wife (retired Web designer), 12yo dog died 08-02-2014   Grown children   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, was Clinical research associate   Edu: JD,  masters in divinity    Activity: TRX and cardio and pilates   Diet: good water , fruits/vegetables daily   Social Drivers of Corporate investment banker Strain: Low Risk  (06/27/2023)   Overall Financial Resource Strain (CARDIA)    Difficulty of Paying Living Expenses: Not hard at all  Food Insecurity: No Food Insecurity (06/27/2023)   Hunger Vital Sign    Worried About Running Out of Food in the Last Year: Never true    Ran Out of Food in the Last Year: Never true  Transportation Needs: No Transportation Needs (06/27/2023)   PRAPARE - Administrator, Civil Service (Medical): No    Lack of Transportation (Non-Medical): No  Physical Activity: Sufficiently Active (06/27/2023)   Exercise Vital Sign    Days of Exercise per Week: 4 days    Minutes of Exercise per Session: 60 min  Stress: No Stress Concern Present (06/27/2023)   [employer] of Occupational Health - Occupational Stress Questionnaire    Feeling of Stress: Not at all  Social Connections: Socially Integrated (06/27/2023)   Social Connection and Isolation Panel    Frequency of Communication with Friends and Family: More than three times a week    Frequency of Social Gatherings with Friends and Family: More than three times a week    Attends Religious Services: More than 4 times per year    Active Member of [employer] or Organizations: Yes    Attends Engineer, structural: More than 4 times per year    Marital Status: Married  Catering manager Violence: Not on file    Review of Systems  Constitutional:  Negative for fatigue and unexpected weight change.  Eyes:  Negative for visual disturbance.  Respiratory:  Negative for cough, chest tightness and shortness of breath.   Cardiovascular:  Negative for chest pain, palpitations and leg swelling.  Gastrointestinal:  Negative for abdominal pain and blood in stool.  Neurological:  Negative for dizziness, light-headedness and headaches.     Objective:    Vitals:   06/27/23 1608  BP: 114/60  Pulse: (!) 53  Resp: 16  Temp: 98.5 F (36.9 C)  TempSrc: Temporal  SpO2: 96%  Weight: 204 lb 12.8 oz (92.9 kg)  Height: 5' 8 (1.727 m)     Physical Exam Vitals reviewed.  Constitutional:      Appearance: He is well-developed.  HENT:     Head: Normocephalic and atraumatic.  Neck:     Vascular: No carotid bruit or JVD.   Cardiovascular:     Rate and Rhythm: Normal rate and regular rhythm.     Heart sounds: Normal heart sounds. No murmur heard. Pulmonary:     Effort: Pulmonary effort is normal.     Breath sounds: Normal breath sounds. No rales.   Musculoskeletal:     Right lower leg: Edema (trace, nonpitting at lower ankles.) present.     Left lower leg: Edema present.   Skin:    General: Skin is warm and dry.   Neurological:     Mental Status: He is alert and oriented to person, place, and time.   Psychiatric:        Mood and Affect: Mood normal.       Assessment & Plan:  Louis [name] is a 68 y.o. male . Essential hypertension - Plan: amLODipine  (NORVASC ) 10 MG tablet, losartan -hydrochlorothiazide  (HYZAAR) 100-12.5 MG tablet, Comprehensive metabolic panel with GFR  - Blood pressure on the lower side, will try lower dose of Toprol  at 25 mg daily.  Monitor home readings, continue same dose of losartan /HCTZ, check labs and adjust plan accordingly.  Need for vaccination against Streptococcus pneumoniae - Plan: Pneumococcal conjugate vaccine 20-valent (Prevnar 20)  Hyperlipidemia, unspecified hyperlipidemia type - Plan: atorvastatin  (LIPITOR) 10 MG tablet, Comprehensive metabolic panel with GFR, Lipid panel  -  Stable, tolerating current regimen. Medications refilled. Labs pending as above.   Neuropathy - Plan: gabapentin  (NEURONTIN ) 300 MG capsule B12 deficiency - Plan: gabapentin  (NEURONTIN ) 300 MG capsule, B12  - Overall stable control with gabapentin , continue same.  Acute midline low back pain, unspecified  whether sciatica present - Plan: methocarbamol  (ROBAXIN ) 500 MG tablet  - Likely strain as above, no concerning symptoms reassuring exam.  Short-term methocarbamol  provided, continue home exercises, stretches with RTC precautions.  Imaging deferred at this time.  Meds ordered this encounter  Medications   metoprolol  succinate (TOPROL -XL) 25 MG 24 hr tablet    Sig: Take 1 tablet (25 mg total) by mouth daily.    Dispense:  90 tablet    Refill:  1   amLODipine  (NORVASC ) 10 MG tablet    Sig: Take one tablet daily    Dispense:  90 tablet    Refill:  2   atorvastatin  (LIPITOR) 10 MG tablet    Sig: TAKE ONE TABLET BY MOUTH 2 TO 3 TIMES PER WEEK    Dispense:  30 tablet    Refill:  1   gabapentin  (NEURONTIN ) 300 MG capsule    Sig: Take 1-2 capsules (300-600 mg total) by mouth 3 (three) times daily.    Dispense:  540 capsule    Refill:  1    New dose.   losartan -hydrochlorothiazide  (HYZAAR) 100-12.5 MG tablet  Sig: Take 1 tablet by mouth daily.    Dispense:  90 tablet    Refill:  2   methocarbamol  (ROBAXIN ) 500 MG tablet    Sig: Take 1 tablet (500 mg total) by mouth every 8 (eight) hours as needed for muscle spasms.    Dispense:  30 tablet    Refill:  1   Patient Instructions  Try decreasing toprol  to 25mg  per day - blood pressure on the lower side today.  No other med changes at this time.  I did send in some Robaxin  if needed for low back pain, see information below.  Continue gentle range of motion with stretches but follow-up if that pain does not continue to improve or any worsening symptoms.  Please have labs performed at the [facility] location in the next week if possible.  If any concerns on labs I will let you know. 77-month follow-up for a physical, sooner if any new or worsening symptoms.  Take care!  [facility] or xray: Walk in 8:30-4:30 during weekdays, no appointment needed [address]  [place], [place] [address]   Acute Back Pain, Adult Acute back pain is sudden and  usually short-lived. It is often caused by an injury to the muscles and tissues in the back. The injury may result from: A muscle, tendon, or ligament getting overstretched or torn. Ligaments are tissues that connect bones to each other. Lifting something improperly can cause a back strain. Wear and tear (degeneration) of the spinal disks. Spinal disks are circular tissue that provide cushioning between the bones of the spine (vertebrae). Twisting motions, such as while playing sports or doing yard work. A hit to the back. Arthritis. You may have a physical exam, lab tests, and imaging tests to find the cause of your pain. Acute back pain usually goes away with rest and home care. Follow these instructions at home: Managing pain, stiffness, and swelling Take over-the-counter and prescription medicines only as told by your health care provider. Treatment may include medicines for pain and inflammation that are taken by mouth or applied to the skin, or muscle relaxants. Your health care provider may recommend applying ice during the first 24-48 hours after your pain starts. To do this: Put ice in a plastic bag. Place a towel between your skin and the bag. Leave the ice on for 20 minutes, 2-3 times a day. Remove the ice if your skin turns bright red. This is very important. If you cannot feel pain, heat, or cold, you have a greater risk of damage to the area. If directed, apply heat to the affected area as often as told by your health care provider. Use the heat source that your health care provider recommends, such as a moist heat pack or a heating pad. Place a towel between your skin and the heat source. Leave the heat on for 20-30 minutes. Remove the heat if your skin turns bright red. This is especially important if you are unable to feel pain, heat, or cold. You have a greater risk of getting burned. Activity  Do not stay in bed. Staying in bed for more than 1-2 days can delay your  recovery. Sit up and stand up straight. Avoid leaning forward when you sit or hunching over when you stand. If you work at a desk, sit close to it so you do not need to lean over. Keep your chin tucked in. Keep your neck drawn back, and keep your elbows bent at a 90-degree  angle (right angle). Sit high and close to the steering wheel when you drive. Add lower back (lumbar) support to your car seat, if needed. Take short walks on even surfaces as soon as you are able. Try to increase the length of time you walk each day. Do not sit, drive, or stand in one place for more than 30 minutes at a time. Sitting or standing for long periods of time can put stress on your back. Do not drive or use heavy machinery while taking prescription pain medicine. Use proper lifting techniques. When you bend and lift, use positions that put less stress on your back: [place] your knees. Keep the load close to your body. Avoid twisting. Exercise regularly as told by your health care provider. Exercising helps your back heal faster and helps prevent back injuries by keeping muscles strong and flexible. Work with a physical therapist to make a safe exercise program, as recommended by your health care provider. Do any exercises as told by your physical therapist. Lifestyle Maintain a healthy weight. Extra weight puts stress on your back and makes it difficult to have good posture. Avoid activities or situations that make you feel anxious or stressed. Stress and anxiety increase muscle tension and can make back pain worse. Learn ways to manage anxiety and stress, such as through exercise. General instructions Sleep on a firm mattress in a comfortable position. Try lying on your side with your knees slightly bent. If you lie on your back, put a pillow under your knees. Keep your head and neck in a straight line with your spine (neutral position) when using electronic equipment like smartphones or pads. To do this: Raise your  smartphone or pad to look at it instead of bending your head or neck to look down. Put the smartphone or pad at the level of your face while looking at the screen. Follow your treatment plan as told by your health care provider. This may include: Cognitive or behavioral therapy. Acupuncture or massage therapy. Meditation or yoga. Contact a health care provider if: You have pain that is not relieved with rest or medicine. You have increasing pain going down into your legs or buttocks. Your pain does not improve after 2 weeks. You have pain at night. You lose weight without trying. You have a fever or chills. You develop nausea or vomiting. You develop abdominal pain. Get help right away if: You develop new bowel or bladder control problems. You have unusual weakness or numbness in your arms or legs. You feel faint. These symptoms may represent a serious problem that is an emergency. Do not wait to see if the symptoms will go away. Get medical help right away. Call your local emergency services (911 in the [place] Do not drive yourself to the hospital. Summary Acute back pain is sudden and usually short-lived. Use proper lifting techniques. When you bend and lift, use positions that put less stress on your back. Take over-the-counter and prescription medicines only as told by your health care provider, and apply heat or ice as told. This information is not intended to replace advice given to you by your health care provider. Make sure you discuss any questions you have with your health care provider. Document Revised: 03/11/2020 Document Reviewed: 03/11/2020 Elsevier Patient Education  2024 Elsevier Inc.     Signed,   Reyes Pines, MD [facility], [facility] 06/27/23 4:52 PM

## 2023-06-29 ENCOUNTER — Encounter: Payer: Self-pay | Admitting: Family Medicine

## 2023-07-09 ENCOUNTER — Other Ambulatory Visit (INDEPENDENT_AMBULATORY_CARE_PROVIDER_SITE_OTHER)

## 2023-07-09 ENCOUNTER — Ambulatory Visit: Payer: Self-pay | Admitting: Family Medicine

## 2023-07-09 DIAGNOSIS — E785 Hyperlipidemia, unspecified: Secondary | ICD-10-CM

## 2023-07-09 DIAGNOSIS — E538 Deficiency of other specified B group vitamins: Secondary | ICD-10-CM | POA: Diagnosis not present

## 2023-07-09 DIAGNOSIS — I10 Essential (primary) hypertension: Secondary | ICD-10-CM | POA: Diagnosis not present

## 2023-07-09 LAB — COMPREHENSIVE METABOLIC PANEL WITH GFR
ALT: 12 U/L (ref 0–53)
AST: 14 U/L (ref 0–37)
Albumin: 4.5 g/dL (ref 3.5–5.2)
Alkaline Phosphatase: 53 U/L (ref 39–117)
BUN: 14 mg/dL (ref 6–23)
CO2: 28 meq/L (ref 19–32)
Calcium: 9.3 mg/dL (ref 8.4–10.5)
Chloride: 99 meq/L (ref 96–112)
Creatinine, Ser: 0.85 mg/dL (ref 0.40–1.50)
GFR: 90.85 mL/min (ref >60.00)
Glucose, Bld: 100 mg/dL — ABNORMAL HIGH (ref 70–99)
Potassium: 4 meq/L (ref 3.5–5.1)
Sodium: 134 meq/L — ABNORMAL LOW (ref 135–145)
Total Bilirubin: 0.8 mg/dL (ref 0.2–1.2)
Total Protein: 7 g/dL (ref 6.0–8.3)

## 2023-07-09 LAB — LIPID PANEL
Cholesterol: 154 mg/dL (ref 0–200)
HDL: 50 mg/dL (ref >39.00)
LDL Cholesterol: 94 mg/dL (ref 0–99)
NonHDL: 103.9
Total CHOL/HDL Ratio: 3
Triglycerides: 50 mg/dL (ref 0.0–149.0)
VLDL: 10 mg/dL (ref 0.0–40.0)

## 2023-07-09 LAB — VITAMIN B12: Vitamin B-12: 637 pg/mL (ref 211–911)

## 2023-07-10 ENCOUNTER — Ambulatory Visit: Admitting: Orthopaedic Surgery

## 2023-07-10 DIAGNOSIS — M546 Pain in thoracic spine: Secondary | ICD-10-CM | POA: Diagnosis not present

## 2023-07-16 ENCOUNTER — Other Ambulatory Visit (INDEPENDENT_AMBULATORY_CARE_PROVIDER_SITE_OTHER)

## 2023-07-16 ENCOUNTER — Ambulatory Visit: Admitting: Orthopaedic Surgery

## 2023-07-16 DIAGNOSIS — M545 Low back pain, unspecified: Secondary | ICD-10-CM

## 2023-07-16 MED ORDER — CYCLOBENZAPRINE HCL 5 MG PO TABS: 5.0000 mg | ORAL_TABLET | Freq: Three times a day (TID) | ORAL | 3 refills | Status: AC | PRN

## 2023-07-16 MED ORDER — PREDNISONE 10 MG (21) PO TBPK: ORAL_TABLET | ORAL | 3 refills | Status: DC

## 2023-07-16 NOTE — Progress Notes (Signed)
 Office Visit Note   Patient: Louis [name]           Date of Birth: February 25, 1955           MRN: [id number] Visit Date: 07/16/2023              Requested by: Levora Reyes SAUNDERS, MD [id number] [place],  [place] [address] PCP: Levora Reyes SAUNDERS, MD   Assessment & Plan: Visit Diagnoses:  1. Lumbar pain     Plan: History of Present Illness The patient, with scoliosis, presents with a month-long history of back pain.  Back pain began after a Pilates session, with onset a day later, and is located along the spine with sharper pain at the lowest part and sides. This sharp pain is atypical. Dry needling therapy was performed one to two weeks prior, which might have contributed to the pain. No leg symptoms such as weakness, numbness, or tingling. Regular exercise, including golf, continues without immediate discomfort but causes pain a couple of days later. Hip pain with exercise is usual, but sharp spinal pain is not. Scoliosis has been present since teenage years, with no surgery and an active lifestyle maintained.  Physical Exam MUSCULOSKELETAL: Hips normal. NEUROLOGICAL: Reflexes normal. Sensation normal. Strength normal.  Assessment and Plan Back pain Back pain likely due to musculoskeletal strain or irritation from Pilates or dry needling. No neurological deficits or acute abnormalities on x-rays.  Severe degenerative scoliosis at baseline. - Prescribed prednisone  dose pack. - Prescribed cyclobenzaprine  (Flexeril ). - Advised taking cyclobenzaprine  at night due to sedation. - Advised activity as tolerated. - Consider physical therapy if no improvement with medications. - Consider MRI if symptoms persist.  Severe scoliosis Severe scoliosis.  Good mobility, no significant issues or neurological deficits.  Follow-Up Instructions: No follow-ups on file.   Orders:  Orders Placed This Encounter  Procedures   XR Lumbar Spine 2-3 Views   Meds ordered this encounter   Medications   predniSONE  (STERAPRED UNI-PAK 21 TAB) 10 MG (21) TBPK tablet    Sig: Take as directed    Dispense:  21 tablet    Refill:  3   cyclobenzaprine  (FLEXERIL ) 5 MG tablet    Sig: Take 1 tablet (5 mg total) by mouth 3 (three) times daily as needed for muscle spasms.    Dispense:  30 tablet    Refill:  3      Procedures: No procedures performed   Clinical Data: No additional findings.   Subjective: Chief Complaint  Patient presents with   Lower Back - Pain    HPI  Review of Systems  Constitutional: Negative.   HENT: Negative.    Eyes: Negative.   Respiratory: Negative.    Cardiovascular: Negative.   Gastrointestinal: Negative.   Endocrine: Negative.   Genitourinary: Negative.   Skin: Negative.   Allergic/Immunologic: Negative.   Neurological: Negative.   Hematological: Negative.   Psychiatric/Behavioral: Negative.    All other systems reviewed and are negative.    Objective: Vital Signs: There were no vitals taken for this visit.  Physical Exam Vitals and nursing note reviewed.  Constitutional:      Appearance: He is well-developed.  HENT:     Head: Normocephalic and atraumatic.  Eyes:     Pupils: Pupils are equal, round, and reactive to light.  Pulmonary:     Effort: Pulmonary effort is normal.  Abdominal:     Palpations: Abdomen is soft.  Musculoskeletal:  General: Normal range of motion.     Cervical back: Neck supple.  Skin:    General: Skin is warm.  Neurological:     Mental Status: He is alert and oriented to person, place, and time.  Psychiatric:        Behavior: Behavior normal.        Thought Content: Thought content normal.        Judgment: Judgment normal.     Ortho Exam  Specialty Comments:  No specialty comments available.  Imaging: XR Lumbar Spine 2-3 Views Result Date: 07/16/2023 X-rays of the lumbar spine shows severe degenerative scoliosis.  No obvious acute abnormalities.  Hip joints are  well-preserved.    PMFS History: Patient Active Problem List   Diagnosis Date Noted   BMI 28.0-28.9,adult 06/27/2023   Diverticulosis of colon 06/27/2023   Family history of malignant neoplasm of colon 06/27/2023   History of colonic polyps 06/27/2023   Pain in right ankle and joints of right foot 06/04/2023   Thoracic aortic aneurysm without rupture (HCC) 10/10/2021   Dermatochalasis 02/10/2021   Ectropion 02/10/2021   Claw toe, acquired, left    Bunion of great toe of left foot    Skin lesion of face 06/14/2020   Unilateral primary osteoarthritis, right knee 01/06/2020   S/P hernia repair 10/15/2019   Neuropathy 07/20/2019   Macrocytic anemia    Debility 07/07/2019   Atelectasis    Enterococcal bacteremia 06/26/2019   Perforated viscus 01/06/2019   Onychomycosis 05/19/2018   Foot fracture, left, closed, initial encounter 05/09/2018   Lisfranc dislocation, left, initial encounter    Primary pulmonary hypertension (HCC) 07/09/2017   Diastolic dysfunction 07/08/2017   OSA (obstructive sleep apnea) 07/08/2017   SOB (shortness of breath) 07/02/2017   Chronic respiratory failure with hypoxia and hypercapnia (HCC) 07/02/2017   Cardiomegaly 07/02/2017   Exposure keratitis 02/17/2017   Pedal edema 12/18/2016   Transaminitis 12/18/2016   Bilateral pes planus 12/18/2016   Psoriasis-eczema overlap condition 07/30/2016   High serum high density lipoprotein (HDL) 09/26/2015   Epistaxis 09/26/2015   Renal artery stenosis in 1 of 2 vessels (HCC)    Essential tremor    Elevated PSA    Obesity, Class I, BMI 30-34.9    Mild intermittent asthma in adult without complication    Seasonal allergies    HTN (hypertension)    Scoliosis    Past Medical History:  Diagnosis Date   Alcohol  use disorder, mild, abuse    Anemia    Anxiety    situational   Arthritis    Phreesia 02/21/2020   Asthma    Phreesia 02/21/2020   BPH (benign prostatic hyperplasia)    on flomax- Patien tand wife  denies- and patient has never been on Flomax   Bundle branch block    per prior PCP records   CHF (congestive heart failure) (HCC)    Chronic kidney disease    Acute Renal Failure- June 2021- admission- kidneys recovered quickly   Complication of anesthesia 03/11/2019   after sx, woke up, but was paralyzed   COPD (chronic obstructive pulmonary disease) (HCC)    Diverticulosis    by colonoscopy   Eczema    per prior PCP records   Elevated PSA 2015   peaked 6s, s/p benign biopsy 2014, sees urology [facility] ([place])   Essential tremor    GERD (gastroesophageal reflux disease)    per prior PCP records   History of panic attacks    per prior  PCP records   HTN (hypertension)    Hypertension    Phreesia 02/21/2020   Mild intermittent asthma in adult without complication    Obesity, Class I, BMI 30-34.9    Osteoporosis    DEXA 06/2013 with osteopenia - h/o ?wrist/hip fracture from AVN from chronic steroid use (asthma), took reclast for 1 year   Pneumonia    10/08/19 >10 years ago   Seasonal allergies    Sleep apnea    Substance abuse (HCC)    Phreesia 02/21/2020   Thoracic aortic aneurysm [facility])    Thoracic scoliosis childhood   Transaminitis    per prior PCP records   Vitamin B 12 deficiency     Family History  Problem Relation Age of Onset   Cancer Father 61       colon   Prostate cancer Father    Diabetes Mother    Alcohol  abuse Mother    Stroke Mother    Cancer Maternal Grandmother        pancreatic   Ataxia Brother    Ataxia Sister    CAD Neg Hx     Past Surgical History:  Procedure Laterality Date   APPLICATION OF WOUND VAC Left 05/09/2018   Procedure: Application Of Wound Vac;  Surgeon: Harden Jerona GAILS, MD;  Location: [facility] OR;  Service: Orthopedics;  Laterality: Left;   BIOPSY  04/30/2019   Procedure: BIOPSY;  Surgeon: Kristie Lamprey, MD;  Location: [facility];  Service: Endoscopy;;   BROW LIFT Bilateral 04/12/2021   Procedure: UPPER LID BLEPHAROPLASTY;  Surgeon:  Lowery Estefana RAMAN, DO;  Location: [facility];  Service: Plastics;  Laterality: Bilateral;   COLONOSCOPY  12/2013   polyps, int hem, diverticulosis, rpt 5 yrs [facility])   COLONOSCOPY WITH PROPOFOL  N/A 04/30/2019   Procedure: COLONOSCOPY WITH PROPOFOL ;  Surgeon: Kristie Lamprey, MD;  Location: [facility];  Service: Endoscopy;  Laterality: N/A;   ELBOW SURGERY Right 2008   golfer's elbow   ESOPHAGOGASTRODUODENOSCOPY (EGD) WITH PROPOFOL  N/A 04/30/2019   Procedure: ESOPHAGOGASTRODUODENOSCOPY (EGD) WITH PROPOFOL ;  Surgeon: Kristie Lamprey, MD;  Location: [facility];  Service: Endoscopy;  Laterality: N/A;   HERNIA REPAIR N/A    Phreesia 02/21/2020   INCISIONAL HERNIA REPAIR N/A 10/15/2019   Procedure: HERNIA REPAIR INCISIONAL WITH MESH, TAR;  Surgeon: Rubin Calamity, MD;  Location: [facility] OR;  Service: General;  Laterality: N/A;   INGUINAL HERNIA REPAIR Bilateral childhood & ~1995   LAPAROTOMY N/A 01/05/2019   Procedure: EXPLORATORY LAPAROTOMY graham patch repair;  Surgeon: Tye Millet, DO;  Location: [facility] ORS;  Service: General;  Laterality: N/A;   LAPAROTOMY N/A 10/15/2019   Procedure: EXPLORATORY LAPAROTOMY;  Surgeon: Rubin Calamity, MD;  Location: [facility] OR;  Service: General;  Laterality: N/A;   LYSIS OF ADHESION N/A 10/15/2019   Procedure: LYSIS OF ADHESION;  Surgeon: Rubin Calamity, MD;  Location: [facility] OR;  Service: General;  Laterality: N/A;   NASAL SEPTUM SURGERY  2013   deviated septum   ORIF ANKLE FRACTURE Left 05/09/2018   Procedure: OPEN REDUCTION INTERNAL FIXATION LEFT LISFRANC FRACTURE/DISLOCATION;  Surgeon: Harden Jerona GAILS, MD;  Location: [facility] OR;  Service: Orthopedics;  Laterality: Left;   POLYPECTOMY  04/30/2019   Procedure: POLYPECTOMY;  Surgeon: Kristie Lamprey, MD;  Location: [facility];  Service: Endoscopy;;   SHOULDER ARTHROSCOPY Left 05/31/2022   Procedure: ARTHROSCOPY SHOULDER DEBRIDEMENT;  Surgeon: Cristy Bonner DASEN, MD;  Location: [facility];  Service:  Orthopedics;  Laterality: Left;   SHOULDER ARTHROSCOPY WITH  BANKART REPAIR Left 05/31/2022   Procedure: SHOULDER ARTHROSCOPY WITH BANKART REPAIR;  Surgeon: Cristy Bonner DASEN, MD;  Location: [facility];  Service: Orthopedics;  Laterality: Left;   TEE WITHOUT CARDIOVERSION N/A 06/29/2019   Procedure: TRANSESOPHAGEAL ECHOCARDIOGRAM (TEE);  Surgeon: Alveta Aleene PARAS, MD;  Location: [facility];  Service: Cardiovascular;  Laterality: N/A;   US  ECHOCARDIOGRAPHY  02/2009   WNL, EF >55% [place])   US  RENAL/AORTA Right 05/2009   1-59% diameter reduction renal artery, rec rpt 2 yrs   VASECTOMY N/A    Phreesia 02/21/2020   WEIL OSTEOTOMY Left 11/16/2020   Procedure: WEIL OSTEOTOMY LEFT 2ND METATARSAL,  2ND TOE DISTAL INTERPHALANGEAL RESECTION AND KATRINA OSTEOTOMY WEIL OSTEOTOMY THIRD METATARSAL;  Surgeon: Harden Jerona GAILS, MD;  Location: [facility] OR;  Service: Orthopedics;  Laterality: Left;   Social History   Occupational History   Not on file  Tobacco Use   Smoking status: Never   Smokeless tobacco: Never  Vaping Use   Vaping status: Never Used  Substance and Sexual Activity   Alcohol  use: Not Currently   Drug use: No   Sexual activity: Yes    Birth control/protection: None

## 2023-08-05 DIAGNOSIS — M546 Pain in thoracic spine: Secondary | ICD-10-CM | POA: Diagnosis not present

## 2023-08-14 DIAGNOSIS — M546 Pain in thoracic spine: Secondary | ICD-10-CM | POA: Diagnosis not present

## 2023-08-19 DIAGNOSIS — M546 Pain in thoracic spine: Secondary | ICD-10-CM | POA: Diagnosis not present

## 2023-09-06 ENCOUNTER — Other Ambulatory Visit: Payer: Self-pay | Admitting: Family Medicine

## 2023-09-06 DIAGNOSIS — I10 Essential (primary) hypertension: Secondary | ICD-10-CM

## 2023-09-16 DIAGNOSIS — M546 Pain in thoracic spine: Secondary | ICD-10-CM | POA: Diagnosis not present

## 2023-09-23 DIAGNOSIS — M546 Pain in thoracic spine: Secondary | ICD-10-CM | POA: Diagnosis not present

## 2023-09-27 ENCOUNTER — Encounter: Payer: Self-pay | Admitting: Family Medicine

## 2023-10-10 DIAGNOSIS — M546 Pain in thoracic spine: Secondary | ICD-10-CM | POA: Diagnosis not present

## 2023-10-14 ENCOUNTER — Other Ambulatory Visit: Payer: Self-pay | Admitting: Surgery

## 2023-10-14 DIAGNOSIS — I712 Thoracic aortic aneurysm, without rupture, unspecified: Secondary | ICD-10-CM

## 2023-10-22 DIAGNOSIS — M546 Pain in thoracic spine: Secondary | ICD-10-CM | POA: Diagnosis not present

## 2023-10-31 DIAGNOSIS — M546 Pain in thoracic spine: Secondary | ICD-10-CM | POA: Diagnosis not present

## 2023-11-04 ENCOUNTER — Encounter: Payer: Self-pay | Admitting: Radiology

## 2023-11-11 ENCOUNTER — Ambulatory Visit

## 2023-11-11 ENCOUNTER — Ambulatory Visit (HOSPITAL_COMMUNITY)

## 2023-11-18 ENCOUNTER — Ambulatory Visit (HOSPITAL_COMMUNITY)
Admission: RE | Admit: 2023-11-18 | Discharge: 2023-11-18 | Disposition: A | Discharge status: Home / Self Care | Source: Ambulatory Visit | Attending: Surgery | Admitting: Surgery

## 2023-11-18 DIAGNOSIS — I712 Thoracic aortic aneurysm, without rupture, unspecified: Secondary | ICD-10-CM | POA: Diagnosis not present

## 2023-11-18 DIAGNOSIS — I700 Atherosclerosis of aorta: Secondary | ICD-10-CM | POA: Diagnosis not present

## 2023-11-18 DIAGNOSIS — R911 Solitary pulmonary nodule: Secondary | ICD-10-CM | POA: Diagnosis not present

## 2023-11-18 DIAGNOSIS — I7121 Aneurysm of the ascending aorta, without rupture: Secondary | ICD-10-CM | POA: Diagnosis not present

## 2023-11-18 MED ORDER — IOHEXOL 350 MG/ML SOLN
75.0000 mL | Freq: Once | INTRAVENOUS | Status: AC | PRN
  Administered 2023-11-18: 75 mL via INTRAVENOUS

## 2023-11-19 DIAGNOSIS — M546 Pain in thoracic spine: Secondary | ICD-10-CM | POA: Diagnosis not present

## 2023-11-21 ENCOUNTER — Ambulatory Visit

## 2023-11-21 VITALS — BP 140/79 | HR 73 | Resp 20 | Ht 68.0 in | Wt 204.0 lb

## 2023-11-21 DIAGNOSIS — I712 Thoracic aortic aneurysm, without rupture, unspecified: Secondary | ICD-10-CM | POA: Diagnosis not present

## 2023-11-21 NOTE — Patient Instructions (Signed)

## 2023-11-21 NOTE — Progress Notes (Signed)
 [address] [place] [address]             [id number]            [place] [id number] [birth year]   History of Present Illness:  Martha Soltys is a 68 year old man with medical history of hypertension, renal artery stenosis, cardiomegaly, pulmonary hypertension, OSA, asthma, scoliosis and essential tremor who presents for continued follow-up of ascending thoracic aortic aneurysm.  Aneurysm has stayed stable in size for the past couple years and on recent CTA of chest measured 4 cm.  Echocardiogram from 06/2019 showed that the aortic valve was normal in structure.   He presents to the clinic today reports that he is doing well.  His blood pressure is slightly elevated today and he reports that he just recently had a medication change.  His amlodipine  to 5 mg instead of 10 mg.  He is very active and does Pilates for exercise.  Denies any lifting.  He denies chest pain, shortness of breath or lower leg edema.    Current Outpatient Medications on File Prior to Visit  Medication Sig Dispense Refill   albuterol  (VENTOLIN  HFA) 108 (90 Base) MCG/ACT inhaler Inhale 2 puffs into the lungs every 6 (six) hours as needed for wheezing or shortness of breath. 8 g 0   amLODipine  (NORVASC ) 10 MG tablet TAKE 1 TABLET BY MOUTH DAILY 90 tablet 2   atorvastatin  (LIPITOR) 10 MG tablet TAKE ONE TABLET BY MOUTH 2 TO 3 TIMES PER WEEK 30 tablet 1   cyclobenzaprine  (FLEXERIL ) 5 MG tablet Take 1 tablet (5 mg total) by mouth 3 (three) times daily as needed for muscle spasms. 30 tablet 3   diclofenac Sodium (VOLTAREN) 1 % GEL Apply 1 application topically 4 (four) times daily as needed (knee pain).     fluticasone (FLONASE) 50 MCG/ACT nasal spray Place into both nostrils daily.     gabapentin  (NEURONTIN ) 300 MG capsule Take 1-2 capsules (300-600 mg total) by mouth 3 (three) times daily. 540 capsule 1   losartan -hydrochlorothiazide  (HYZAAR) 100-12.5 MG tablet Take 1 tablet by  mouth daily. 90 tablet 2   metoprolol  succinate (TOPROL -XL) 25 MG 24 hr tablet Take 1 tablet (25 mg total) by mouth daily. 90 tablet 1   Multiple Vitamin (MULTIVITAMIN WITH MINERALS) TABS tablet Take 1 tablet by mouth daily.     olopatadine (PATANOL) 0.1 % ophthalmic solution Place 1 drop into both eyes 2 (two) times daily as needed for allergies.     omeprazole  (PRILOSEC) 20 MG capsule Take 20 mg by mouth daily.     predniSONE  (STERAPRED UNI-PAK 21 TAB) 10 MG (21) TBPK tablet Take as directed (Patient not taking: Reported on 11/21/2023) 21 tablet 3   No current facility-administered medications on file prior to visit.     ROS: Review of Systems  Constitutional: Negative.  Negative for malaise/fatigue.  Respiratory: Negative.  Negative for cough and shortness of breath.   Cardiovascular: Negative.  Negative for chest pain and leg swelling.     BP (!) 140/79   Pulse 73   Resp 20   Ht 5' 8 (1.727 m)   Wt 204 lb (92.5 kg)   SpO2 95% Comment: RA  BMI 31.02 kg/m   Physical Exam Constitutional:      Appearance: Normal appearance.  HENT:     Head: Normocephalic and atraumatic.  Skin:    General: Skin is  warm and dry.  Neurological:     General: No focal deficit present.     Mental Status: He is alert and oriented to person, place, and time.      Imaging: CLINICAL DATA:  Thoracic aortic aneurysm.   EXAM: CT ANGIOGRAPHY CHEST WITH CONTRAST   TECHNIQUE: Multidetector CT imaging of the chest was performed using the standard protocol during bolus administration of intravenous contrast. Multiplanar CT image reconstructions and MIPs were obtained to evaluate the vascular anatomy.   RADIATION DOSE REDUCTION: This exam was performed according to the departmental dose-optimization program which includes automated exposure control, adjustment of the mA and/or kV according to patient size and/or use of iterative reconstruction technique.   CONTRAST:  75mL OMNIPAQUE  IOHEXOL  350  MG/ML SOLN   COMPARISON:  October 23, 2022.   FINDINGS: Cardiovascular: 4 cm ascending thoracic aortic aneurysm, unchanged from the prior exam. No evidence thoracic aortic dissection. Heart size is upper limits of normal. No pericardial effusion. Mild atherosclerotic calcification of the thoracic aorta.   Mediastinum/Nodes: No enlarged mediastinal, hilar, or axillary lymph nodes. Thyroid  gland, trachea, and esophagus demonstrate no significant findings.   Lungs/Pleura: No focal consolidation, pleural effusion, or pneumothorax. Stable 3 mm nodule in the lingula (series 302, image 95). Mild bibasilar subsegmental atelectasis/scarring.   Upper Abdomen: No acute abnormality.   Musculoskeletal: Severe dextroscoliosis of the thoracic spine is again noted.   Review of the MIP images confirms the above findings.   IMPRESSION: Stable 4 cm ascending thoracic aortic aneurysm. Recommend semi-annual imaging followup by CTA or MRA and referral to cardiothoracic surgery if not already obtained. This recommendation follows 2010 ACCF/AHA/AATS/ACR/ASA/SCA/SCAI/SIR/STS/SVM Guidelines for the Diagnosis and Management of Patients With Thoracic Aortic Disease. Circulation. 2010; 121: Z733-z63. Aortic aneurysm NOS (ICD10-I71.9)     Electronically Signed   By: Harrietta Sherry M.D.   On: 11/18/2023 10:00     A/P:  Thoracic aortic aneurysm without rupture, unspecified part -4.0 cm ascending thoracic aortic aneurysm on CTA of chest.  Echocardiogram showed normal structure aortic valve. -We discussed the natural history and and risk factors for growth of ascending aortic aneurysms. Discussed recommendations to minimize the risk of further expansion or dissection including careful blood pressure control, avoidance of contact sports and heavy lifting, attention to lipid management.  We covered the importance of staying never user of tobacco.  The patient does not yet meet surgical criteria of  >5.5cm. The patient is aware of signs and symptoms of aortic dissection and when to present to the emergency department   -Follow up in one year with CTA of chest for continued surveillance    Risk Modification:  Statin:  atorvastatin   Smoking cessation instruction/counseling given:  never user  Patient was counseled on importance of Blood Pressure Control  They are instructed to contact their Primary Care Physician if they start to have blood pressure readings over 130s/90s. Do not ever stop blood pressure medications on your own, unless instructed by healthcare professional.  Please avoid use of Fluoroquinolones as this can potentially increase your risk of Aortic Rupture and/or Dissection  Patient educated on signs and symptoms of Aortic Dissection, handout also provided in AVS  Manuelita CHRISTELLA Rough, PA-C 11/21/23

## 2023-11-27 DIAGNOSIS — M546 Pain in thoracic spine: Secondary | ICD-10-CM | POA: Diagnosis not present

## 2023-12-03 ENCOUNTER — Encounter: Payer: Self-pay | Admitting: Family Medicine

## 2023-12-03 DIAGNOSIS — I10 Essential (primary) hypertension: Secondary | ICD-10-CM

## 2023-12-03 MED ORDER — AMLODIPINE BESYLATE 10 MG PO TABS: 10.0000 mg | ORAL_TABLET | Freq: Every day | ORAL | 2 refills | Status: AC

## 2023-12-05 ENCOUNTER — Other Ambulatory Visit: Payer: Self-pay | Admitting: Family Medicine

## 2023-12-05 NOTE — Telephone Encounter (Signed)
 Louis [name] [facility]   12/05/23  4:19 PM Note Pt called in because he requested for metoprolol  to be refilled but amlodipine  was sent in instead. He mentioned that he is now completely out of metoprolol . Please advise.

## 2023-12-05 NOTE — Telephone Encounter (Signed)
 Pt called in because he requested for metoprolol  to be refilled but amlodipine  was sent in instead. He mentioned that he is now completely out of metoprolol . Please advise.

## 2023-12-06 ENCOUNTER — Other Ambulatory Visit: Payer: Self-pay

## 2023-12-06 MED ORDER — METOPROLOL SUCCINATE ER 25 MG PO TB24: 25.0000 mg | ORAL_TABLET | Freq: Every day | ORAL | 1 refills | Status: AC

## 2023-12-06 NOTE — Telephone Encounter (Signed)
Medication has been filled 

## 2023-12-09 DIAGNOSIS — M546 Pain in thoracic spine: Secondary | ICD-10-CM | POA: Diagnosis not present

## 2023-12-23 ENCOUNTER — Encounter: Admitting: Family Medicine

## 2023-12-28 ENCOUNTER — Other Ambulatory Visit: Payer: Self-pay | Admitting: Family Medicine

## 2023-12-28 DIAGNOSIS — G629 Polyneuropathy, unspecified: Secondary | ICD-10-CM

## 2023-12-28 DIAGNOSIS — E538 Deficiency of other specified B group vitamins: Secondary | ICD-10-CM

## 2024-01-08 ENCOUNTER — Ambulatory Visit: Admitting: *Deleted

## 2024-01-08 VITALS — Ht 71.0 in | Wt 204.0 lb

## 2024-01-08 DIAGNOSIS — Z0000 Encounter for general adult medical examination without abnormal findings: Secondary | ICD-10-CM

## 2024-01-08 NOTE — Progress Notes (Signed)
 "  Chief Complaint  Patient presents with   Medicare Wellness     Subjective:   Louis [name] is a 69 y.o. male who presents for a Medicare Annual Wellness Visit.  No voiced or noted concerns at this time Patient advised to keep follow-up appointment with PCP (02-24-2024)   Visit info / Clinical Intake: Medicare Wellness Visit Type:: Initial Annual Wellness Visit Persons participating in visit and providing information:: patient Medicare Wellness Visit Mode:: Telephone If telephone:: video declined Since this visit was completed virtually, some vitals may be partially provided or unavailable. Missing vitals are due to the limitations of the virtual format.: Unable to obtain vitals - no equipment If Telephone or Video please confirm:: I connected with patient using audio/video enable telemedicine. I verified patient identity with two identifiers, discussed telehealth limitations, and patient agreed to proceed. Patient Location:: home Provider Location:: office Interpreter Needed?: No Pre-visit prep was completed: no AWV questionnaire completed by patient prior to visit?: no Living arrangements:: lives with spouse/significant other [name] Overall Health Status Rating: good Typical amount of pain: none Does pain affect daily life?: no Are you currently prescribed opioids?: no  Dietary Habits and Nutritional Risks How many meals a day?: 3 Eats fruit and vegetables daily?: yes Most meals are obtained by: preparing own meals In the last 2 weeks, have you had any of the following?: none Diabetic:: no  Functional Status Activities of Daily Living (to include ambulation/medication): Independent Ambulation: Independent Medication Administration: Independent Home Management (perform basic housework or laundry): Independent Manage your own finances?: yes Primary transportation is: driving Concerns about vision?: no *vision screening is required for WTM* Concerns about  hearing?: no  Fall Screening Falls in the past year?: 0 Number of falls in past year: 0 Was there an injury with Fall?: 0 Fall Risk Category Calculator: 0 Patient Fall Risk Level: Low Fall Risk  Fall Risk Patient at Risk for Falls Due to: No Fall Risks Fall risk Follow up: Falls evaluation completed; Education provided; Falls prevention discussed  Home and Transportation Safety: All rugs have non-skid backing?: yes All stairs or steps have railings?: yes Grab bars in the bathtub or shower?: (!) no Have non-skid surface in bathtub or shower?: yes Good home lighting?: yes Regular seat belt use?: yes Hospital stays in the last year:: no  Cognitive Assessment Difficulty concentrating, remembering, or making decisions? : yes Will 6CIT or Mini Cog be Completed: yes What year is it?: 0 points What month is it?: 0 points Give patient an address phrase to remember (5 components): Its very sunny outside today in January About what time is it?: 0 points Count backwards from 20 to 1: 0 points Say the months of the year in reverse: 0 points Repeat the address phrase from earlier: 2 points 6 CIT Score: 2 points  Advance Directives (For Healthcare) Does Patient Have a Medical Advance Directive?: No Would patient like information on creating a medical advance directive?: No - Patient declined  Reviewed/Updated  Reviewed/Updated: Reviewed All (Medical, Surgical, Family, Medications, Allergies, Care Teams, Patient Goals); Surgical History; Family History; Medications; Allergies; Care Teams; Patient Goals; Medical History    Allergies (verified) Accupril [quinapril hcl], Nsaids, and Quinolones   Current Medications (verified) Outpatient Encounter Medications as of 01/08/2024  Medication Sig   albuterol  (VENTOLIN  HFA) 108 (90 Base) MCG/ACT inhaler Inhale 2 puffs into the lungs every 6 (six) hours as needed for wheezing or shortness of breath.   amLODipine  (NORVASC ) 10 MG tablet Take 1  tablet (10 mg total) by mouth daily.   atorvastatin  (LIPITOR) 10 MG tablet TAKE ONE TABLET BY MOUTH 2 TO 3 TIMES PER WEEK   cyclobenzaprine  (FLEXERIL ) 5 MG tablet Take 1 tablet (5 mg total) by mouth 3 (three) times daily as needed for muscle spasms.   diclofenac Sodium (VOLTAREN) 1 % GEL Apply 1 application topically 4 (four) times daily as needed (knee pain).   fluticasone (FLONASE) 50 MCG/ACT nasal spray Place into both nostrils daily.   gabapentin  (NEURONTIN ) 300 MG capsule TAKE 1 TO 2 CAPSULES BY MOUTH 3 TIMES A DAY   losartan -hydrochlorothiazide  (HYZAAR) 100-12.5 MG tablet Take 1 tablet by mouth daily.   metoprolol  succinate (TOPROL -XL) 25 MG 24 hr tablet Take 1 tablet (25 mg total) by mouth daily.   Multiple Vitamin (MULTIVITAMIN WITH MINERALS) TABS tablet Take 1 tablet by mouth daily.   olopatadine (PATANOL) 0.1 % ophthalmic solution Place 1 drop into both eyes 2 (two) times daily as needed for allergies.   omeprazole  (PRILOSEC) 20 MG capsule Take 20 mg by mouth daily.   predniSONE  (STERAPRED UNI-PAK 21 TAB) 10 MG (21) TBPK tablet Take as directed (Patient not taking: Reported on 01/08/2024)   No facility-administered encounter medications on file as of 01/08/2024.    History: Past Medical History:  Diagnosis Date   Alcohol  use disorder, mild, abuse    Anemia    Anxiety    situational   Arthritis    Phreesia 02/21/2020   Asthma    Phreesia 02/21/2020   BPH (benign prostatic hyperplasia)    on flomax- Patien tand wife denies- and patient has never been on Flomax   Bundle branch block    per prior PCP records   CHF (congestive heart failure) (HCC)    Chronic kidney disease    Acute Renal Failure- June 2021- admission- kidneys recovered quickly   Complication of anesthesia 03/11/2019   after sx, woke up, but was paralyzed   COPD (chronic obstructive pulmonary disease) (HCC)    Diverticulosis    by colonoscopy   Eczema    per prior PCP records   Elevated PSA 2015   peaked 6s,  s/p benign biopsy 2014, sees urology [facility] ([place])   Essential tremor    GERD (gastroesophageal reflux disease)    per prior PCP records   History of panic attacks    per prior PCP records   HTN (hypertension)    Hypertension    Phreesia 02/21/2020   Mild intermittent asthma in adult without complication    Obesity, Class I, BMI 30-34.9    Osteoporosis    DEXA 06/2013 with osteopenia - h/o ?wrist/hip fracture from AVN from chronic steroid use (asthma), took reclast for 1 year   Pneumonia    10/08/19 >10 years ago   Seasonal allergies    Sleep apnea    Substance abuse (HCC)    Phreesia 02/21/2020   Thoracic aortic aneurysm    Thoracic scoliosis childhood   Transaminitis    per prior PCP records   Vitamin B 12 deficiency    Past Surgical History:  Procedure Laterality Date   APPLICATION OF WOUND VAC Left 05/09/2018   Procedure: Application Of Wound Vac;  Surgeon: Harden Jerona GAILS, MD;  Location: [facility] OR;  Service: Orthopedics;  Laterality: Left;   BIOPSY  04/30/2019   Procedure: BIOPSY;  Surgeon: Kristie Lamprey, MD;  Location: [facility];  Service: Endoscopy;;   BROW LIFT Bilateral 04/12/2021   Procedure: UPPER LID BLEPHAROPLASTY;  Surgeon: Lowery,  Estefana RAMAN, DO;  Location: [facility];  Service: Plastics;  Laterality: Bilateral;   COLONOSCOPY  12/2013   polyps, int hem, diverticulosis, rpt 5 yrs Claudio)   COLONOSCOPY WITH PROPOFOL  N/A 04/30/2019   Procedure: COLONOSCOPY WITH PROPOFOL ;  Surgeon: Kristie Lamprey, MD;  Location: [facility];  Service: Endoscopy;  Laterality: N/A;   ELBOW SURGERY Right 2008   golfer's elbow   ESOPHAGOGASTRODUODENOSCOPY (EGD) WITH PROPOFOL  N/A 04/30/2019   Procedure: ESOPHAGOGASTRODUODENOSCOPY (EGD) WITH PROPOFOL ;  Surgeon: Kristie Lamprey, MD;  Location: [facility];  Service: Endoscopy;  Laterality: N/A;   HERNIA REPAIR N/A    Phreesia 02/21/2020   INCISIONAL HERNIA REPAIR N/A 10/15/2019   Procedure: HERNIA REPAIR INCISIONAL WITH MESH,  TAR;  Surgeon: Rubin Calamity, MD;  Location: [facility] OR;  Service: General;  Laterality: N/A;   INGUINAL HERNIA REPAIR Bilateral childhood & ~1995   LAPAROTOMY N/A 01/05/2019   Procedure: EXPLORATORY LAPAROTOMY graham patch repair;  Surgeon: Tye Millet, DO;  Location: [facility]S;  Service: General;  Laterality: N/A;   LAPAROTOMY N/A 10/15/2019   Procedure: EXPLORATORY LAPAROTOMY;  Surgeon: Rubin Calamity, MD;  Location: [facility] OR;  Service: General;  Laterality: N/A;   LYSIS OF ADHESION N/A 10/15/2019   Procedure: LYSIS OF ADHESION;  Surgeon: Rubin Calamity, MD;  Location: [facility] OR;  Service: General;  Laterality: N/A;   NASAL SEPTUM SURGERY  2013   deviated septum   ORIF ANKLE FRACTURE Left 05/09/2018   Procedure: OPEN REDUCTION INTERNAL FIXATION LEFT LISFRANC FRACTURE/DISLOCATION;  Surgeon: Harden Jerona GAILS, MD;  Location: [facility] OR;  Service: Orthopedics;  Laterality: Left;   POLYPECTOMY  04/30/2019   Procedure: POLYPECTOMY;  Surgeon: Kristie Lamprey, MD;  Location: [facility];  Service: Endoscopy;;   SHOULDER ARTHROSCOPY Left 05/31/2022   Procedure: ARTHROSCOPY SHOULDER DEBRIDEMENT;  Surgeon: Cristy Bonner DASEN, MD;  Location: [facility];  Service: Orthopedics;  Laterality: Left;   SHOULDER ARTHROSCOPY WITH BANKART REPAIR Left 05/31/2022   Procedure: SHOULDER ARTHROSCOPY WITH BANKART REPAIR;  Surgeon: Cristy Bonner DASEN, MD;  Location: [facility];  Service: Orthopedics;  Laterality: Left;   TEE WITHOUT CARDIOVERSION N/A 06/29/2019   Procedure: TRANSESOPHAGEAL ECHOCARDIOGRAM (TEE);  Surgeon: Alveta Aleene PARAS, MD;  Location: [facility];  Service: Cardiovascular;  Laterality: N/A;   US  ECHOCARDIOGRAPHY  02/2009   WNL, EF >55% [place])   US  RENAL/AORTA Right 05/2009   1-59% diameter reduction renal artery, rec rpt 2 yrs   VASECTOMY N/A    Phreesia 02/21/2020   WEIL OSTEOTOMY Left 11/16/2020   Procedure: WEIL OSTEOTOMY LEFT 2ND METATARSAL,  2ND TOE DISTAL INTERPHALANGEAL RESECTION AND KATRINA  OSTEOTOMY WEIL OSTEOTOMY THIRD METATARSAL;  Surgeon: Harden Jerona GAILS, MD;  Location: [facility] OR;  Service: Orthopedics;  Laterality: Left;   Family History  Problem Relation Age of Onset   Cancer Father 26       colon   Prostate cancer Father    Diabetes Mother    Alcohol  abuse Mother    Stroke Mother    Cancer Maternal Grandmother        pancreatic   Ataxia Brother    Ataxia Sister    CAD Neg Hx    Social History   Occupational History   Not on file  Tobacco Use   Smoking status: Never   Smokeless tobacco: Never  Vaping Use   Vaping status: Never Used  Substance and Sexual Activity   Alcohol  use: Not Currently   Drug use: No   Sexual activity: Yes  Birth control/protection: None   Tobacco Counseling Counseling given: Not Answered  SDOH Screenings   Food Insecurity: No Food Insecurity (01/08/2024)  Housing: Low Risk (01/08/2024)  Transportation Needs: No Transportation Needs (01/08/2024)  Utilities: Not At Risk (01/08/2024)  Alcohol  Screen: Low Risk (08/02/2021)  Depression (PHQ2-9): Low Risk (01/08/2024)  Financial Resource Strain: Low Risk (06/27/2023)  Physical Activity: Sufficiently Active (01/08/2024)  Social Connections: Socially Integrated (01/08/2024)  Stress: No Stress Concern Present (01/08/2024)  Tobacco Use: Low Risk (01/08/2024)  Health Literacy: Adequate Health Literacy (01/08/2024)   See flowsheets for full screening details  Depression Screen PHQ 2 & 9 Depression Scale- Over the past 2 weeks, how often have you been bothered by any of the following problems? Little interest or pleasure in doing things: 0 Feeling down, depressed, or hopeless (PHQ Adolescent also includes...irritable): 0 PHQ-2 Total Score: 0 Trouble falling or staying asleep, or sleeping too much: 0 Feeling tired or having little energy: 0 Poor appetite or overeating (PHQ Adolescent also includes...weight loss): 0 Feeling bad about yourself - or that you are a failure or have let yourself or your family  down: 0 Trouble concentrating on things, such as reading the newspaper or watching television (PHQ Adolescent also includes...like school work): 0 Moving or speaking so slowly that other people could have noticed. Or the opposite - being so fidgety or restless that you have been moving around a lot more than usual: 0 Thoughts that you would be better off dead, or of hurting yourself in some way: 0 PHQ-9 Total Score: 0 If you checked off any problems, how difficult have these problems made it for you to do your work, take care of things at home, or get along with other people?: Not difficult at all     Goals Addressed             This Visit's Progress    Patient Stated       Continue excerise             Objective:    Today's Vitals   01/08/24 1344  Weight: 204 lb (92.5 kg)  Height: 5' 11 (1.803 m)   Body mass index is 28.45 kg/m.  Hearing/Vision screen Hearing Screening - Comments:: No trouble hearing Vision Screening - Comments:: Up to date Every two years groat Immunizations and Health Maintenance Health Maintenance  Topic Date Due   COVID-19 Vaccine (10 - 2025-26 season) 09/02/2023   Colonoscopy  04/29/2024   Medicare Annual Wellness (AWV)  01/07/2025   DTaP/Tdap/Td (3 - Td or Tdap) 11/19/2026   Pneumococcal Vaccine: 50+ Years  Completed   Influenza Vaccine  Completed   Hepatitis C Screening  Completed   Zoster Vaccines- Shingrix  Completed   Meningococcal B Vaccine  Aged Out        Assessment/Plan:  This is a routine wellness examination for Bj's.  Patient Care Team: Levora Reyes SAUNDERS, MD as PCP - General (Family Medicine) Burnard Debby LABOR, MD (Inactive) as PCP - Cardiology (Cardiology) Jude Harden GAILS, MD as Consulting Physician (Pulmonary Disease) Cristy Bonner DASEN, MD as Consulting Physician (Orthopedic Surgery) Octavia Charleston, MD as Referring Physician (Ophthalmology)  I have personally reviewed and noted the following in the patients chart:    Medical and social history Use of alcohol , tobacco or illicit drugs  Current medications and supplements including opioid prescriptions. Functional ability and status Nutritional status Physical activity Advanced directives List of other physicians Hospitalizations, surgeries, and ER visits in previous 12 months Vitals  Screenings to include cognitive, depression, and falls Referrals and appointments  No orders of the defined types were placed in this encounter.  In addition, I have reviewed and discussed with patient certain preventive protocols, quality metrics, and best practice recommendations. A written personalized care plan for preventive services as well as general preventive health recommendations were provided to patient.   Mliss Graff, LPN   08/03/7971   Return in 1 year (on 01/07/2025).  After Visit Summary: (MyChart) Due to this being a telephonic visit, the after visit summary with patients personalized plan was offered to patient via MyChart   Nurse Notes:  "

## 2024-01-08 NOTE — Patient Instructions (Signed)
 Louis [name],  Thank you for taking the time for your Medicare Wellness Visit. I appreciate your continued commitment to your health goals. Please review the care plan we discussed, and feel free to reach out if I can assist you further.  Please note that Annual Wellness Visits do not include a physical exam. Some assessments may be limited, especially if the visit was conducted virtually. If needed, we may recommend an in-person follow-up with your provider.  Ongoing Care Seeing your primary care provider every 3 to 6 months helps us  monitor your health and provide consistent, personalized care.   Referrals If a referral was made during today's visit and you haven't received any updates within two weeks, please contact the referred provider directly to check on the status.  Recommended Screenings:  Health Maintenance  Topic Date Due   COVID-19 Vaccine (10 - 2025-26 season) 09/02/2023   Colon Cancer Screening  04/29/2024   Medicare Annual Wellness Visit  01/07/2025   DTaP/Tdap/Td vaccine (3 - Td or Tdap) 11/19/2026   Pneumococcal Vaccine for age over 73  Completed   Flu Shot  Completed   Hepatitis C Screening  Completed   Zoster (Shingles) Vaccine  Completed   Meningitis B Vaccine  Aged Out       05/31/2022    9:56 AM  Advanced Directives  Does Patient Have a Medical Advance Directive? No  Would patient like information on creating a medical advance directive? No - Patient declined    Vision: Annual vision screenings are recommended for early detection of glaucoma, cataracts, and diabetic retinopathy. These exams can also reveal signs of chronic conditions such as diabetes and high blood pressure.  Dental: Annual dental screenings help detect early signs of oral cancer, gum disease, and other conditions linked to overall health, including heart disease and diabetes.  Please see the attached documents for additional preventive care recommendations.    Louis [name] , Thank you for  taking time to come for your Medicare Wellness Visit. I appreciate your ongoing commitment to your health goals. Please review the following plan we discussed and let me know if I can assist you in the future.   Screening recommendations/referrals: Colonoscopy:  Recommended yearly ophthalmology/optometry visit for glaucoma screening and checkup Recommended yearly dental visit for hygiene and checkup  Vaccinations: Influenza vaccine:  Pneumococcal vaccine:  Tdap vaccine:  Shingles vaccine:      Preventive Care 65 Years and Older, Male Preventive care refers to lifestyle choices and visits with your health care provider that can promote health and wellness. What does preventive care include? A yearly physical exam. This is also called an annual well check. Dental exams once or twice a year. Routine eye exams. Ask your health care provider how often you should have your eyes checked. Personal lifestyle choices, including: Daily care of your teeth and gums. Regular physical activity. Eating a healthy diet. Avoiding tobacco and drug use. Limiting alcohol  use. Practicing safe sex. Taking low doses of aspirin every day. Taking vitamin and mineral supplements as recommended by your health care provider. What happens during an annual well check? The services and screenings done by your health care provider during your annual well check will depend on your age, overall health, lifestyle risk factors, and family history of disease. Counseling  Your health care provider may ask you questions about your: Alcohol  use. Tobacco use. Drug use. Emotional well-being. Home and relationship well-being. Sexual activity. Eating habits. History of falls. Memory and ability to understand (cognition).  Work and work astronomer. Screening  You may have the following tests or measurements: Height, weight, and BMI. Blood pressure. Lipid and cholesterol levels. These may be checked every 5 years, or  more frequently if you are over 46 years old. Skin check. Lung cancer screening. You may have this screening every year starting at age 50 if you have a 30-pack-year history of smoking and currently smoke or have quit within the past 15 years. Fecal occult blood test (FOBT) of the stool. You may have this test every year starting at age 38. Flexible sigmoidoscopy or colonoscopy. You may have a sigmoidoscopy every 5 years or a colonoscopy every 10 years starting at age 33. Prostate cancer screening. Recommendations will vary depending on your family history and other risks. Hepatitis C blood test. Hepatitis B blood test. Sexually transmitted disease (STD) testing. Diabetes screening. This is done by checking your blood sugar (glucose) after you have not eaten for a while (fasting). You may have this done every 1-3 years. Abdominal aortic aneurysm (AAA) screening. You may need this if you are a current or former smoker. Osteoporosis. You may be screened starting at age 57 if you are at high risk. Talk with your health care provider about your test results, treatment options, and if necessary, the need for more tests. Vaccines  Your health care provider may recommend certain vaccines, such as: Influenza vaccine. This is recommended every year. Tetanus, diphtheria, and acellular pertussis (Tdap, Td) vaccine. You may need a Td booster every 10 years. Zoster vaccine. You may need this after age 8. Pneumococcal 13-valent conjugate (PCV13) vaccine. One dose is recommended after age 64. Pneumococcal polysaccharide (PPSV23) vaccine. One dose is recommended after age 88. Talk to your health care provider about which screenings and vaccines you need and how often you need them. This information is not intended to replace advice given to you by your health care provider. Make sure you discuss any questions you have with your health care provider. Document Released: 01/14/2015 Document Revised: 09/07/2015  Document Reviewed: 10/19/2014 Elsevier Interactive Patient Education  2017 [employer].  Fall Prevention in the Home Falls can cause injuries. They can happen to people of all ages. There are many things you can do to make your home safe and to help prevent falls. What can I do on the outside of my home? Regularly fix the edges of walkways and driveways and fix any cracks. Remove anything that might make you trip as you walk through a door, such as a raised step or threshold. Trim any bushes or trees on the path to your home. Use bright outdoor lighting. Clear any walking paths of anything that might make someone trip, such as rocks or tools. Regularly check to see if handrails are loose or broken. Make sure that both sides of any steps have handrails. Any raised decks and porches should have guardrails on the edges. Have any leaves, snow, or ice cleared regularly. Use sand or salt on walking paths during winter. Clean up any spills in your garage right away. This includes oil or grease spills. What can I do in the bathroom? Use night lights. Install grab bars by the toilet and in the tub and shower. Do not use towel bars as grab bars. Use non-skid mats or decals in the tub or shower. If you need to sit down in the shower, use a plastic, non-slip stool. Keep the floor dry. Clean up any water  that spills on the floor as soon as  it happens. Remove soap buildup in the tub or shower regularly. Attach bath mats securely with double-sided non-slip rug tape. Do not have throw rugs and other things on the floor that can make you trip. What can I do in the bedroom? Use night lights. Make sure that you have a light by your bed that is easy to reach. Do not use any sheets or blankets that are too big for your bed. They should not hang down onto the floor. Have a firm chair that has side arms. You can use this for support while you get dressed. Do not have throw rugs and other things on the floor  that can make you trip. What can I do in the kitchen? Clean up any spills right away. Avoid walking on wet floors. Keep items that you use a lot in easy-to-reach places. If you need to reach something above you, use a strong step stool that has a grab bar. Keep electrical cords out of the way. Do not use floor polish or wax that makes floors slippery. If you must use wax, use non-skid floor wax. Do not have throw rugs and other things on the floor that can make you trip. What can I do with my stairs? Do not leave any items on the stairs. Make sure that there are handrails on both sides of the stairs and use them. Fix handrails that are broken or loose. Make sure that handrails are as long as the stairways. Check any carpeting to make sure that it is firmly attached to the stairs. Fix any carpet that is loose or worn. Avoid having throw rugs at the top or bottom of the stairs. If you do have throw rugs, attach them to the floor with carpet tape. Make sure that you have a light switch at the top of the stairs and the bottom of the stairs. If you do not have them, ask someone to add them for you. What else can I do to help prevent falls? Wear shoes that: Do not have high heels. Have rubber bottoms. Are comfortable and fit you well. Are closed at the toe. Do not wear sandals. If you use a stepladder: Make sure that it is fully opened. Do not climb a closed stepladder. Make sure that both sides of the stepladder are locked into place. Ask someone to hold it for you, if possible. Clearly [place] and make sure that you can see: Any grab bars or handrails. First and last steps. Where the edge of each step is. Use tools that help you move around (mobility aids) if they are needed. These include: Canes. Walkers. Scooters. Crutches. Turn on the lights when you go into a dark area. Replace any light bulbs as soon as they burn out. Set up your furniture so you have a clear path. Avoid moving your  furniture around. If any of your floors are uneven, fix them. If there are any pets around you, be aware of where they are. Review your medicines with your doctor. Some medicines can make you feel dizzy. This can increase your chance of falling. Ask your doctor what other things that you can do to help prevent falls. This information is not intended to replace advice given to you by your health care provider. Make sure you discuss any questions you have with your health care provider. Document Released: 10/14/2008 Document Revised: 05/26/2015 Document Reviewed: 01/22/2014 Elsevier Interactive Patient Education  2017 [employer].

## 2024-01-09 NOTE — Progress Notes (Signed)
 Louis [name]                                          MRN: [id number]   01/09/2024   The [facility]BCI Quality Team Specialist reviewed this patient medical record for the purposes of chart review for care gap closure. The following were reviewed: chart review for care gap closure-controlling blood pressure.    VBCI Quality Team

## 2024-01-12 ENCOUNTER — Telehealth: Admitting: Physician Assistant

## 2024-01-12 DIAGNOSIS — J069 Acute upper respiratory infection, unspecified: Secondary | ICD-10-CM | POA: Diagnosis not present

## 2024-01-12 MED ORDER — LEVOCETIRIZINE DIHYDROCHLORIDE 5 MG PO TABS: 5.0000 mg | ORAL_TABLET | Freq: Every evening | ORAL | 0 refills | Status: AC

## 2024-01-12 MED ORDER — BENZONATATE 200 MG PO CAPS: 200.0000 mg | ORAL_CAPSULE | Freq: Two times a day (BID) | ORAL | 0 refills | Status: AC | PRN

## 2024-01-12 NOTE — Patient Instructions (Addendum)
 " Louis [name], thank you for joining Teena Shuck, PA-C for today's virtual visit.  While this provider is not your primary care provider (PCP), if your PCP is located in our provider database this encounter information will be shared with them immediately following your visit.   A [facility] MyChart account gives you access to today's visit and all your visits, tests, and labs performed at [facility]  click here if you don't have a [facility] MyChart account or go to [email or web address]  Consent: (Patient) Louis [name] provided verbal consent for this virtual visit at the beginning of the encounter.  Current Medications:  Current Outpatient Medications:    albuterol  (VENTOLIN  HFA) 108 (90 Base) MCG/ACT inhaler, Inhale 2 puffs into the lungs every 6 (six) hours as needed for wheezing or shortness of breath., Disp: 8 g, Rfl: 0   amLODipine  (NORVASC ) 10 MG tablet, Take 1 tablet (10 mg total) by mouth daily., Disp: 90 tablet, Rfl: 2   atorvastatin  (LIPITOR) 10 MG tablet, TAKE ONE TABLET BY MOUTH 2 TO 3 TIMES PER WEEK, Disp: 30 tablet, Rfl: 1   cyclobenzaprine  (FLEXERIL ) 5 MG tablet, Take 1 tablet (5 mg total) by mouth 3 (three) times daily as needed for muscle spasms., Disp: 30 tablet, Rfl: 3   diclofenac Sodium (VOLTAREN) 1 % GEL, Apply 1 application topically 4 (four) times daily as needed (knee pain)., Disp: , Rfl:    fluticasone (FLONASE) 50 MCG/ACT nasal spray, Place into both nostrils daily., Disp: , Rfl:    gabapentin  (NEURONTIN ) 300 MG capsule, TAKE 1 TO 2 CAPSULES BY MOUTH 3 TIMES A DAY, Disp: 540 capsule, Rfl: 1   losartan -hydrochlorothiazide  (HYZAAR) 100-12.5 MG tablet, Take 1 tablet by mouth daily., Disp: 90 tablet, Rfl: 2   metoprolol  succinate (TOPROL -XL) 25 MG 24 hr tablet, Take 1 tablet (25 mg total) by mouth daily., Disp: 90 tablet, Rfl: 1   Multiple Vitamin (MULTIVITAMIN WITH MINERALS) TABS tablet, Take 1 tablet by mouth daily., Disp: , Rfl:     olopatadine (PATANOL) 0.1 % ophthalmic solution, Place 1 drop into both eyes 2 (two) times daily as needed for allergies., Disp: , Rfl:    omeprazole  (PRILOSEC) 20 MG capsule, Take 20 mg by mouth daily., Disp: , Rfl:    predniSONE  (STERAPRED UNI-PAK 21 TAB) 10 MG (21) TBPK tablet, Take as directed (Patient not taking: Reported on 01/08/2024), Disp: 21 tablet, Rfl: 3   Medications ordered in this encounter:  No orders of the defined types were placed in this encounter.    *If you need refills on other medications prior to your next appointment, please contact your pharmacy*  Follow-Up: Call back or seek an in-person evaluation if the symptoms worsen or if the condition fails to improve as anticipated.  [facility] [phone]  Other Instructions Follow up with primary provider in 24-48 hours. Report to nearest ER with any worsening symptoms.    If you have been instructed to have an in-person evaluation today at a local Urgent Care facility, please use the link below. It will take you to a list of all of our available [facility], including address, phone number and hours of operation. Please do not delay care.  [facility]  If you or a family member do not have a primary care provider, use the link below to schedule a visit and establish care. When you choose a [facility] primary care physician or advanced practice provider, you gain  a long-term partner in health. Find a Primary Care Provider  Learn more about [facility] in-office and virtual care options:  - Get Care Now  "

## 2024-01-12 NOTE — Progress Notes (Signed)
 " Virtual Visit Consent   Louis [name], you are scheduled for a virtual visit with a [facility] provider today. Just as with appointments in the office, your consent must be obtained to participate. Your consent will be active for this visit and any virtual visit you may have with one of our providers in the next 365 days. If you have a MyChart account, a copy of this consent can be sent to you electronically.  As this is a virtual visit, video technology does not allow for your provider to perform a traditional examination. This may limit your provider's ability to fully assess your condition. If your provider identifies any concerns that need to be evaluated in person or the need to arrange testing (such as labs, EKG, etc.), we will make arrangements to do so. Although advances in technology are sophisticated, we cannot ensure that it will always work on either your end or our end. If the connection with a video visit is poor, the visit may have to be switched to a telephone visit. With either a video or telephone visit, we are not always able to ensure that we have a secure connection.  By engaging in this virtual visit, you consent to the provision of healthcare and authorize for your insurance to be billed (if applicable) for the services provided during this visit. Depending on your insurance coverage, you may receive a charge related to this service.  I need to obtain your verbal consent now. Are you willing to proceed with your visit today? Louis [name] has provided verbal consent on 01/12/2024 for a virtual visit (video or telephone). Teena Shuck, [place]  Date: 01/12/2024 2:32 PM   Virtual Visit via Video Note   I, Teena Shuck, connected with  Louis [name]  ([id number], 1955/05/12) on 01/12/2024 at  2:30 PM EST by a video-enabled telemedicine application and verified that I am speaking with the correct person using two identifiers.  Location: Patient: Virtual Visit Location  Patient: Home Provider: Virtual Visit Location Provider: Home Office   I discussed the limitations of evaluation and management by telemedicine and the availability of in person appointments. The patient expressed understanding and agreed to proceed.    History of Present Illness: Louis [name] is a 69 y.o. who identifies as a male who was assigned male at birth, and is being seen today for URI.  HPI: URI  This is a new problem. The current episode started in the past 7 days. The problem has been unchanged. There has been no fever. Associated symptoms include coughing, rhinorrhea and sneezing. He has tried decongestant for the symptoms. The treatment provided mild relief.    Problems:  Patient Active Problem List   Diagnosis Date Noted   BMI 28.0-28.9,adult 06/27/2023   Diverticulosis of colon 06/27/2023   Family history of malignant neoplasm of colon 06/27/2023   History of colonic polyps 06/27/2023   Pain in right ankle and joints of right foot 06/04/2023   Thoracic aortic aneurysm without rupture 10/10/2021   Dermatochalasis 02/10/2021   Ectropion 02/10/2021   Claw toe, acquired, left    Bunion of great toe of left foot    Skin lesion of face 06/14/2020   Unilateral primary osteoarthritis, right knee 01/06/2020   S/P hernia repair 10/15/2019   Neuropathy 07/20/2019   Macrocytic anemia    Debility 07/07/2019   Atelectasis    Enterococcal bacteremia 06/26/2019   Perforated viscus 01/06/2019   Onychomycosis 05/19/2018   Foot fracture, left,  closed, initial encounter 05/09/2018   Lisfranc dislocation, left, initial encounter    Primary pulmonary hypertension (HCC) 07/09/2017   Diastolic dysfunction 07/08/2017   OSA (obstructive sleep apnea) 07/08/2017   SOB (shortness of breath) 07/02/2017   Chronic respiratory failure with hypoxia and hypercapnia (HCC) 07/02/2017   Cardiomegaly 07/02/2017   Exposure keratitis 02/17/2017   Pedal edema 12/18/2016   Transaminitis  12/18/2016   Bilateral pes planus 12/18/2016   Psoriasis-eczema overlap condition 07/30/2016   High serum high density lipoprotein (HDL) 09/26/2015   Epistaxis 09/26/2015   Renal artery stenosis in 1 of 2 vessels    Essential tremor    Elevated PSA    Obesity, Class I, BMI 30-34.9    Mild intermittent asthma in adult without complication    Seasonal allergies    HTN (hypertension)    Scoliosis     Allergies: Allergies[1] Medications: Current Medications[2]  Observations/Objective: Patient is well-developed, well-nourished in no acute distress.  Resting comfortably  at home.  Head is normocephalic, atraumatic.  No labored breathing.  Speech is clear and coherent with logical content.  Patient is alert and oriented at baseline.    Assessment and Plan: 1. Upper respiratory tract infection, unspecified type (Primary)  Patient presenting with URI. Differentials include allergic rhinitis, COVID,  bacterial pneumonia, sinusitis. Do not suspect underlying cardiopulmonary process. I considered, but think unlikely, dangerous causes of this patient's symptoms to include ACS, CHF or pneumonia, pneumothorax. Patient is nontoxic and not in need of emergent medical intervention.  Plan: reassurance, reassessment, discharge with PCP follow-up  Follow Up Instructions: I discussed the assessment and treatment plan with the patient. The patient was provided an opportunity to ask questions and all were answered. The patient agreed with the plan and demonstrated an understanding of the instructions.  A copy of instructions were sent to the patient via MyChart unless otherwise noted below.    The patient was advised to call back or seek an in-person evaluation if the symptoms worsen or if the condition fails to improve as anticipated.    Teena Shuck, PA-C    [1]  Allergies Allergen Reactions   Accupril [Quinapril Hcl] Cough   Nsaids     Perforated gastric ulcer   Quinolones Other (See  Comments)    Hx of thoracic aortic aneurysm. Avoid quinolones.   [2]  Current Outpatient Medications:    albuterol  (VENTOLIN  HFA) 108 (90 Base) MCG/ACT inhaler, Inhale 2 puffs into the lungs every 6 (six) hours as needed for wheezing or shortness of breath., Disp: 8 g, Rfl: 0   amLODipine  (NORVASC ) 10 MG tablet, Take 1 tablet (10 mg total) by mouth daily., Disp: 90 tablet, Rfl: 2   atorvastatin  (LIPITOR) 10 MG tablet, TAKE ONE TABLET BY MOUTH 2 TO 3 TIMES PER WEEK, Disp: 30 tablet, Rfl: 1   cyclobenzaprine  (FLEXERIL ) 5 MG tablet, Take 1 tablet (5 mg total) by mouth 3 (three) times daily as needed for muscle spasms., Disp: 30 tablet, Rfl: 3   diclofenac Sodium (VOLTAREN) 1 % GEL, Apply 1 application topically 4 (four) times daily as needed (knee pain)., Disp: , Rfl:    fluticasone (FLONASE) 50 MCG/ACT nasal spray, Place into both nostrils daily., Disp: , Rfl:    gabapentin  (NEURONTIN ) 300 MG capsule, TAKE 1 TO 2 CAPSULES BY MOUTH 3 TIMES A DAY, Disp: 540 capsule, Rfl: 1   losartan -hydrochlorothiazide  (HYZAAR) 100-12.5 MG tablet, Take 1 tablet by mouth daily., Disp: 90 tablet, Rfl: 2   metoprolol  succinate (TOPROL -XL) 25  MG 24 hr tablet, Take 1 tablet (25 mg total) by mouth daily., Disp: 90 tablet, Rfl: 1   Multiple Vitamin (MULTIVITAMIN WITH MINERALS) TABS tablet, Take 1 tablet by mouth daily., Disp: , Rfl:    olopatadine (PATANOL) 0.1 % ophthalmic solution, Place 1 drop into both eyes 2 (two) times daily as needed for allergies., Disp: , Rfl:    omeprazole  (PRILOSEC) 20 MG capsule, Take 20 mg by mouth daily., Disp: , Rfl:    predniSONE  (STERAPRED UNI-PAK 21 TAB) 10 MG (21) TBPK tablet, Take as directed (Patient not taking: Reported on 01/08/2024), Disp: 21 tablet, Rfl: 3  "

## 2024-01-21 ENCOUNTER — Encounter (HOSPITAL_BASED_OUTPATIENT_CLINIC_OR_DEPARTMENT_OTHER): Payer: Self-pay | Admitting: Pulmonary Disease

## 2024-01-24 ENCOUNTER — Encounter (HOSPITAL_BASED_OUTPATIENT_CLINIC_OR_DEPARTMENT_OTHER): Payer: Self-pay | Admitting: Pulmonary Disease

## 2024-01-24 ENCOUNTER — Other Ambulatory Visit (HOSPITAL_BASED_OUTPATIENT_CLINIC_OR_DEPARTMENT_OTHER): Payer: Self-pay | Admitting: Pulmonary Disease

## 2024-01-24 ENCOUNTER — Other Ambulatory Visit (HOSPITAL_BASED_OUTPATIENT_CLINIC_OR_DEPARTMENT_OTHER): Payer: Self-pay

## 2024-01-24 ENCOUNTER — Ambulatory Visit (HOSPITAL_BASED_OUTPATIENT_CLINIC_OR_DEPARTMENT_OTHER): Admitting: Pulmonary Disease

## 2024-01-24 VITALS — BP 118/71 | HR 65 | Ht 71.0 in | Wt 207.0 lb

## 2024-01-24 DIAGNOSIS — J9612 Chronic respiratory failure with hypercapnia: Secondary | ICD-10-CM | POA: Diagnosis not present

## 2024-01-24 DIAGNOSIS — G4733 Obstructive sleep apnea (adult) (pediatric): Secondary | ICD-10-CM | POA: Diagnosis not present

## 2024-01-24 DIAGNOSIS — J209 Acute bronchitis, unspecified: Secondary | ICD-10-CM

## 2024-01-24 MED ORDER — AZITHROMYCIN 250 MG PO TABS: ORAL_TABLET | ORAL | 0 refills | Status: AC

## 2024-01-24 MED ORDER — PREDNISONE 10 MG (21) PO TBPK: ORAL_TABLET | ORAL | 3 refills | Status: AC

## 2024-01-24 MED ORDER — AZITHROMYCIN 250 MG PO TABS: 250.0000 mg | ORAL_TABLET | Freq: Every day | ORAL | 0 refills | Status: DC

## 2024-01-24 NOTE — Progress Notes (Signed)
 "  Subjective:    [name], male    DOB: 12-Aug-1955, 69 y.o.   MRN: [id number]   69 yo never smoker, pastor for FU of OSA & restrictive lung disease due to scoliosis       PMH - Had severe asthma as a child but did not bother him as an adult, PFTs have shown moderate airway obstruction , has scoliosis but no significant restriction with normal TLC -ventral hernia repair 10/2019`  -Alcoholism, underwent rehab sober since 2021   -He was hospitalized with hypoxia & hypercarbia  In 07/2017 after trip to [place] - [facility] admission 06/2019  for enterococcal bacteremia , TEE negative-complicated by hypercarbic respiratory failure requiring mechanical ventilation for 1 day   Last OV 03/2021 >>Louis [name] is done remarkably well with weight loss. He now only has mild OSA especially in the supine position. His events are minimal when he is in the lateral position.-dc O2, continue on CPAP for now due to his prior episodes of hypercarbic respiratory failure    Discussed the use of AI scribe software for clinical note transcription with the patient, who gave verbal consent to proceed.  History of Present Illness Louis [name] is a 69 year old male with OSA and hypercarbic respiratory failure who presents for follow-up and evaluation of a persistent cough.  He has had a cold with cough for about a month. Symptoms initially improved then worsened, with a severe coughing spell last night at 1 AM. The cough is productive of thick sputum that was green and is now slightly yellow. Flu and COVID-19 tests were negative.  He had a virtual visit with his primary doctor and was started on an antihistamine and benzonatate . The antihistamine caused significant grogginess so he stopped it after a few days. He has not received antibiotics or prednisone .  He notes slightly increased shortness of breath with exercise compared with baseline, but no significant dyspnea with ordinary walking.  For  OSA, he uses CPAP with pressures 12 to 15 cm H2O and reports good control with low event rates. There is a minor mask leak likely related to his beard. He does not check his CPAP app regularly but recent data were satisfactory.     Significant tests/ events reviewed   12/2020 ONO on CPAP/room air showed desaturation less than 88% for about 8 minutes     CT angiogram 07/2017 >> severe scoliosis, suggestion of pulmonary hypertension. Echo 07/2017 grade 2 diastolic dysfunction, normal LV function, decreased RV function but normal PA pressures EKG-right axis deviation 07/2017 ABG 7.31/72/71 on 4 L oxygen      03/2021 HST >> mild OSA, predominantly during supine position, lowest desat 83% 08/2017 NPSG - 224 lbs - AHI 39/h, >> CPAP 9 cm + 2L o2   PFTs 08/2017 >> postbronchodilator ratio 71, FEV1 61% with FVC 65%, no significant broncho dilator response, TLC 90%, DLCO 96% suggestive extraparenchymal restriction   Review of Systems  neg for any significant sore throat, dysphagia, itching, sneezing, nasal congestion or excess/ purulent secretions, fever, chills, sweats, unintended wt loss, pleuritic or exertional cp, hempoptysis, orthopnea pnd or change in chronic leg swelling. Also denies presyncope, palpitations, heartburn, abdominal pain, nausea, vomiting, diarrhea or change in bowel or urinary habits, dysuria,hematuria, rash, arthralgias, visual complaints, headache, numbness weakness or ataxia.      Objective:   Physical Exam  Gen. Pleasant, obese, in no distress ENT - no lesions, no post nasal drip Neck: No JVD, no thyromegaly,  no carotid bruits Lungs: scoliosis +no use of accessory muscles, no dullness to percussion, decreased without rales or rhonchi  Cardiovascular: Rhythm regular, heart sounds  normal, no murmurs or gallops, no peripheral edema Musculoskeletal: No deformities, no cyanosis or clubbing , no tremors       Assessment & Plan:   Assessment and Plan Assessment &  Plan Acute bronchitis Persistent cough for about a month with thick, initially greenish, now yellowish sputum. No shortness of breath with walking, but slight dyspnea with exercise. Lungs clear on examination. Previous treatment with antihistamine and benzonatate  provided limited relief. No need for chest x-ray as lungs are clear. - Prescribed Z-Pak (azithromycin ) for green sputum. - Prescribed prednisone  taper to be used if wheezing or coughing worsens. - Recommended Mucinex  during the day for productive cough. - Recommended Delsym at night for nonproductive cough to aid sleep.  Obstructive sleep apnea Well-managed with CPAP therapy. Excellent compliance with usage. Average pressure between 12 to 15 cm H2O, with a maximum of 14 cm H2O. Mask leak noted but not concerning to him. - Continue current CPAP therapy. - Consider trying F40 mask if interested.  Chronic hypercapnic respiratory failure - no evidence of worsening due to scoliosis      "

## 2024-01-24 NOTE — Patient Instructions (Signed)
" °  VISIT SUMMARY: During your visit, we discussed your persistent cough and reviewed your management of obstructive sleep apnea (OSA). We also evaluated your chronic hypercapnic respiratory failure.  YOUR PLAN: -ACUTE BRONCHITIS: Acute bronchitis is an inflammation of the bronchial tubes, which carry air to your lungs, often causing a cough with mucus. You have been prescribed a Z-Pak (azithromycin ) to address the green sputum. If your wheezing or coughing worsens, you should start the prednisone  taper. For your cough, take Mucinex  during the day to help with mucus and Delsym at night to help you sleep.  -OBSTRUCTIVE SLEEP APNEA: Obstructive sleep apnea (OSA) is a condition where your breathing stops and starts during sleep. Your OSA is well-managed with your current CPAP therapy. Continue using your CPAP machine as you have been. If you are interested, you might consider trying an F40 mask to see if it improves comfort.   INSTRUCTIONS: Please follow the prescribed medication regimen for your acute bronchitis. Continue using your CPAP machine as directed. If your symptoms worsen or you have any concerns, schedule a follow-up appointment.    Contains text generated by [employer].   "

## 2024-02-24 ENCOUNTER — Encounter: Admitting: Family Medicine
